# Patient Record
Sex: Male | Born: 1935
Health system: Southern US, Community
[De-identification: ages and names within clinical notes are randomized; demographics above are authoritative.]

## PROBLEM LIST (undated history)

## (undated) DIAGNOSIS — I714 Abdominal aortic aneurysm, without rupture, unspecified: Secondary | ICD-10-CM

## (undated) DIAGNOSIS — I2510 Atherosclerotic heart disease of native coronary artery without angina pectoris: Secondary | ICD-10-CM

## (undated) DIAGNOSIS — J449 Chronic obstructive pulmonary disease, unspecified: Secondary | ICD-10-CM

## (undated) DIAGNOSIS — I50810 Right heart failure, unspecified: Secondary | ICD-10-CM

## (undated) DIAGNOSIS — E785 Hyperlipidemia, unspecified: Secondary | ICD-10-CM

## (undated) DIAGNOSIS — I272 Pulmonary hypertension, unspecified: Secondary | ICD-10-CM

## (undated) DIAGNOSIS — J45909 Unspecified asthma, uncomplicated: Secondary | ICD-10-CM

## (undated) DIAGNOSIS — G4730 Sleep apnea, unspecified: Secondary | ICD-10-CM

## (undated) DIAGNOSIS — C4491 Basal cell carcinoma of skin, unspecified: Secondary | ICD-10-CM

## (undated) DIAGNOSIS — J9610 Chronic respiratory failure, unspecified whether with hypoxia or hypercapnia: Secondary | ICD-10-CM

## (undated) DIAGNOSIS — K5792 Diverticulitis of intestine, part unspecified, without perforation or abscess without bleeding: Secondary | ICD-10-CM

## (undated) DIAGNOSIS — C4490 Unspecified malignant neoplasm of skin, unspecified: Secondary | ICD-10-CM

## (undated) DIAGNOSIS — J189 Pneumonia, unspecified organism: Secondary | ICD-10-CM

## (undated) DIAGNOSIS — Z8709 Personal history of other diseases of the respiratory system: Secondary | ICD-10-CM

## (undated) DIAGNOSIS — I499 Cardiac arrhythmia, unspecified: Secondary | ICD-10-CM

## (undated) DIAGNOSIS — I219 Acute myocardial infarction, unspecified: Secondary | ICD-10-CM

## (undated) DIAGNOSIS — E039 Hypothyroidism, unspecified: Secondary | ICD-10-CM

## (undated) DIAGNOSIS — I350 Nonrheumatic aortic (valve) stenosis: Secondary | ICD-10-CM

## (undated) DIAGNOSIS — I509 Heart failure, unspecified: Secondary | ICD-10-CM

## (undated) HISTORY — DX: Sleep apnea, unspecified: G47.30

## (undated) HISTORY — PX: CORONARY ARTERY BYPASS GRAFT: SHX141

## (undated) HISTORY — DX: Chronic obstructive pulmonary disease, unspecified: J44.9

## (undated) HISTORY — PX: EYE SURGERY: SHX253

## (undated) HISTORY — DX: Atherosclerotic heart disease of native coronary artery without angina pectoris: I25.10

## (undated) HISTORY — DX: Abdominal aortic aneurysm, without rupture, unspecified: I71.40

## (undated) HISTORY — DX: Basal cell carcinoma of skin, unspecified: C44.91

## (undated) HISTORY — DX: Unspecified asthma, uncomplicated: J45.909

## (undated) HISTORY — DX: Abdominal aortic aneurysm, without rupture: I71.4

## (undated) HISTORY — DX: Unspecified malignant neoplasm of skin, unspecified: C44.90

## (undated) NOTE — *Deleted (*Deleted)
NAME:  QUILL GRINDER, MRN:  [id number], DOB:  08/20/1923, LOS: 0 ADMISSION DATE:  09/07/2020, CONSULTATION DATE:  09/08/20 REFERRING MD:  Dr April P, CHIEF COMPLAINT:  Septic shock from cholecystitis  Brief History   30 yo M presenting with acute cholecystitis with septioc shock, resp failure, new discovery urothelial cancer.  History of present illness    89 year full code and cognitively intact male with medical problems listed below. He is on home palliative care (not hospice). Doing ADLs as of Spring/summer 2021. Originally from [place]. [facility] vet. Relocated in 2021 to [place] from [place]. S/p CABG 1998. S/p infrarenal endovascular repair of AAA 2009. 2018 s/p diverticular bleed. 2018 - spont ptx hx.  Echo 2019 with RVSP . RHC Feb 2019 with PCWP 10, PVR 8 and mPAP 44. Follows Dr Isaiah Serge in [place] for mixed ILD (calcified RUL scarring and mild ILD. The repeat high res resolution CT shows stability of findings from 2016 - 2019.) and associated emphysema . Last seen Jan 2021. Also OSA on cPAP   Presented late hours of 09/07/20 with SIRS (temp 102F) and sepsis syndrome. Had worsening hypoxemia (baseline 2l Zinc) and needing NRB but later [facility] after failing to tolerate bipap. CTA ruled oput PE but shouwed LUL infiltrate. CTAb showed evidence of acute cholecystitis. He was in septic shock and needing levophed via PIV. CCM asked to admit. PEr EDP - Dr Carolynne Edouard of [facility] aware of patient and will be seen > 7am on 09/08/20 day shift for consideration of perc drain. Patient is full code  Notable other admit finding  - urothelial cancer on bladder wall seen posteriorly without perforation (not mentioned in past hx) - no hx of covid vaccination on chart  Past Medical History     has a past medical history of AAA (abdominal aortic aneurysm) (HCC), Acute respiratory failure (HCC) (11/23/2019), Asthma, Basal cell carcinoma, Chronic respiratory failure (HCC), COPD (chronic obstructive pulmonary disease) (HCC),  Coronary artery disease, History of chronic respiratory failure, Hyperlipidemia, Hypothyroidism, Right heart failure (HCC), Skin cancer (2017), and Sleep apnea.   reports that he quit smoking about 59 years ago. He smoked 0.50 packs per day. He has never used smokeless tobacco.  Past Surgical History:  Procedure Laterality Date  . bleeding ulcer  2016  . BYPASS AXILLA/BRACHIAL ARTERY  1998  . colon bleed  2012  . COLONOSCOPY N/A 06/07/2017   Procedure: COLONOSCOPY;  Surgeon: Jeani Hawking, MD;  Location: [facility] ENDOSCOPY;  Service: Endoscopy;  Laterality: N/A;  . DEBRIDMENT OF DECUBITUS ULCER  2002  . IR ANGIOGRAM SELECTIVE EACH ADDITIONAL VESSEL  03/22/2020  . IR ANGIOGRAM VISCERAL SELECTIVE  03/22/2020  . IR US GUIDE VASC ACCESS RIGHT  03/22/2020  . RIGHT HEART CATH N/A 12/11/2017   Procedure: RIGHT HEART CATH;  Surgeon: Dolores Patty, MD;  Location: [facility] INVASIVE CV LAB;  Service: Cardiovascular;  Laterality: N/A;  . THORACIC AORTA STENT  2009    No Known Allergies  Immunization History  Administered Date(s) Administered  . Influenza, High Dose Seasonal PF 07/27/2016, 08/05/2017, 08/10/2019  . Pneumococcal Conjugate-13 08/05/2017  . Pneumococcal Polysaccharide-23 10/22/2002, 08/08/2018  . Pneumococcal-Unspecified 10/22/2002    Family History  Problem Relation Age of Onset  . Emphysema Mother   . Stroke Father   . Movement disorder Sister   . Aneurysm Brother        Thoracic aorta  . Macular degeneration Brother      Current Facility-Administered Medications:  .  0.9 %  sodium chloride infusion, 250 mL, Intravenous, Continuous, McDonald, Mia A, PA-C, Last Rate: 20 mL/hr at 09/08/20 0651, 250 mL at 09/08/20 0651 .  0.9 %  sodium chloride infusion, 250 mL, Intravenous, Continuous, Ramaswamy, Murali, MD .  albuterol (PROVENTIL) (2.5 MG/3ML) 0.083% nebulizer solution 2.5 mg, 2.5 mg, Nebulization, Q4H, Ramaswamy, Murali, MD, 2.5 mg at 09/08/20 0810 .  ceFEPIme (MAXIPIME) 2 g in  sodium chloride 0.9 % 100 mL IVPB, 2 g, Intravenous, Q12H, Pham, Anh P, RPH .  dextrose 5 % in lactated ringers infusion, , Intravenous, Continuous, Ramaswamy, Murali, MD, Last Rate: 50 mL/hr at 09/08/20 0803, New Bag at 09/08/20 0803 .  docusate sodium (COLACE) capsule 100 mg, 100 mg, Oral, BID PRN, Kalman Shan, MD .  enoxaparin (LOVENOX) injection 30 mg, 30 mg, Subcutaneous, Daily, Ramaswamy, Murali, MD .  insulin aspart (novoLOG) injection 0-15 Units, 0-15 Units, Subcutaneous, Q4H, Ramaswamy, Murali, MD .  ipratropium (ATROVENT) nebulizer solution 0.5 mg, 0.5 mg, Nebulization, Q4H, Ramaswamy, Murali, MD, 0.5 mg at 09/08/20 0810 .  levothyroxine (SYNTHROID) tablet 100 mcg, 100 mcg, Oral, Q0600, Kalman Shan, MD, 100 mcg at 09/08/20 0801 .  norepinephrine (LEVOPHED) 4mg  in premix infusion, 2-10 mcg/min, Intravenous, Titrated, Ramaswamy, Murali, MD .  pantoprazole (PROTONIX) injection 40 mg, 40 mg, Intravenous, QHS, Ramaswamy, Murali, MD .  polyethylene glycol (MIRALAX / GLYCOLAX) packet 17 g, 17 g, Oral, Daily PRN, Kalman Shan, MD    Significant Hospital Events   09/07/2020 - ER 11/18- admit by CCM  Consults:  Surgery  Procedures:    Significant Diagnostic Tests:  11/18 CTA Chest/Abdomen >> Acute cholecystitis; ***  Micro Data:   11/18 Covid >> negative 11/18 Flu >> negative 11/18 Urine Culture >> 11/18 BC X2 >>   Antimicrobials:   11/18 Zosyn >> 11/18 11/18 Vanc >> 11/18 11/18 Cefepime >>   Interim history/subjective:  Afebrile; appears comfortable; on HFNC 15 l/m BP 80/52 on 10 of Levo; HR 51 Tmax 101.7; WBC 12.6 Glucose range 90-119 I/O total +277.6  Objective   Blood pressure (!) 95/52, pulse (!) 52, temperature 97.6 F (36.4 C), temperature source Oral, resp. rate 15, height 5\' 7"  (1.702 m), weight 70.3 kg, SpO2 93 %.       No intake or output data in the 24 hours ending 09/08/20 1007 Filed Weights   09/08/20 0731  Weight: 70.3 kg     Examination: General: edlerly male in bed in ER. No distrress HENT: HHFNC + Lungs: CTA bilaterally. Overall dminished. No wheeze. No crack;es Cardiovascular: Tachy Abdomen: soft. Non tender Extremities: intact. No cyanosis. No clubbing. No eema Neuro: alert and oritened x 3 GU: not examined  Resolved Hospital Problem list   Hypokalemia  Assessment & Plan:  ASSESSMENT / PLAN:  Septic Shock Hypotension Leukocytosis Fever -Levophed MAP > 65 -IV fluids -Continue Cefepime broad spectrum coverage -Follow Blood cultures and urine culture -Follow intermittent CXR  Acute hypoxic resp failure Emphysema and post inflammatory ILD Chronic oxygen at home OSA on CPAP at home Pulmonary Hypertension -Continue HFNC to maintain sats > 88% -Albuterol and Atrovent  -Will order Home med Symbicort tomorrow -CPAP at bedtime and day prn -Will hold home meds sildenafil, macitentan, torsemide and spironolactone   Acute Cholecystitis -Defer to surgery -Plan for HIDA scan 11/18; Perc chole drain placement following HIDA scan -Continue Cefepime and IV fluids  D-CHF and cor pulmonale (sees Dr Teressa Lower) Bradycardia -Consider Echo  Urothelial cancer -Defer to urollogy  Hypokalemia -Resolved -Recheck CMP in morning -Replete K  prn   Hypothyroidsism -Continue home med Synthroid   Best practice:  Diet: NPO but meds Pain/Anxiety/Delirium protocol (if indicated): NA VAP protocol (if indicated): NA DVT prophylaxis: Lovenox GI prophylaxis: ppi Glucose control: ssi Mobility: bed rest Code Status: full code Family Communication: ***Called daughter Festus Aloe [phone] at 6.56am 09/08/20 Disposition: ICU     ATTESTATION & SIGNATURE      09/08/2020 10:07 AM    LABS    PULMONARY No results for input(s): PHART, PCO2ART, PO2ART, HCO3, TCO2, O2SAT in the last 168 hours.  Invalid input(s): PCO2, PO2  CBC Recent Labs  Lab 09/07/20 2320 09/08/20 0740  HGB 13.4  12.1*  HCT 40.1 37.3*  WBC 11.1* 12.6*  PLT 245 232    COAGULATION Recent Labs  Lab 09/07/20 2355 09/08/20 0740  INR 1.1 1.3*    CARDIAC  No results for input(s): TROPONINI in the last 168 hours. No results for input(s): PROBNP in the last 168 hours.   CHEMISTRY Recent Labs  Lab 09/07/20 2320 09/08/20 0011 09/08/20 0740  NA 137  --  136  K 3.3*  --  3.6  CL 102  --  102  CO2 24  --  24  GLUCOSE 113*  --  129*  BUN 26*  --  25*  CREATININE 1.14 1.20 1.19  CALCIUM 8.4*  --  7.9*  MG  --   --  2.0  PHOS  --   --  4.2   Estimated Creatinine Clearance: 34.7 mL/min (by C-G formula based on SCr of 1.19 mg/dL).   LIVER Recent Labs  Lab 09/07/20 2320 09/07/20 2355 09/08/20 0740  AST 608*  --  439*  ALT 297*  --  278*  ALKPHOS 227*  --  184*  BILITOT 2.7*  --  3.7*  PROT 7.8  --  6.9  ALBUMIN 3.8  --  3.2*  INR  --  1.1 1.3*     INFECTIOUS Recent Labs  Lab 09/07/20 2355 09/08/20 0740  LATICACIDVEN 1.5 1.3  PROCALCITON  --  6.79     ENDOCRINE CBG (last 3)  Recent Labs    09/08/20 0754  GLUCAP 119*         IMAGING x48h  - image(s) personally visualized  -   highlighted in bold CT Angio Chest PE W and/or Wo Contrast  Result Date: 09/08/2020 CLINICAL DATA:  Fever, chills, leukocytosis, thoracoabdominal aortic aneurysm, respiratory failure EXAM: CT ANGIOGRAPHY CHEST CT ABDOMEN AND PELVIS WITH CONTRAST TECHNIQUE: Multidetector CT imaging of the chest was performed using the standard protocol during bolus administration of intravenous contrast. Multiplanar CT image reconstructions and MIPs were obtained to evaluate the vascular anatomy. Multidetector CT imaging of the abdomen and pelvis was performed using the standard protocol during bolus administration of intravenous contrast. CONTRAST:  OMNIPAQUE IOHEXOL 350 MG/ML SOLN COMPARISON:  CTA chest 11/22/2019, CT abdomen pelvis 07/14/2020 the TI is FINDINGS: CTA CHEST FINDINGS Cardiovascular: There  is excellent opacification of the pulmonary arterial tree. No intraluminal filling defect identified to suggest acute pulmonary embolism. Central pulmonary arteries are enlarged in keeping with changes of pulmonary arterial hypertension, unchanged from prior examination. Coronary artery bypass grafting has been performed. There is mild global cardiomegaly with particular enlargement of the right atrium and right ventricle. There is reflux of contrast into the hepatic caval junction in keeping with at least some degree of right heart failure. Extensive calcification of the a aortic valve leaflets. No pericardial effusion. The ascending aorta is  dilated measuring 4.5 cm in greatest dimension, stable since prior examination. The aortic arch measures 3.1 cm in greatest dimension. The descending thoracic aorta at the level of the left atrium measures 2.8 cm in greatest dimension. Mediastinum/Nodes: No pathologic thoracic adenopathy. Esophagus unremarkable. Lungs/Pleura: Imaging is slightly limited by motion artifact. There has developed progressive subpleural reticulation throughout the lungs likely representing mild fibrotic change. There is, however, more focal superimposed ground-glass pulmonary infiltrate within the left upper lobe, likely infectious or inflammatory in the acute setting. No pneumothorax or pleural effusion. Central airways are widely patent. Musculoskeletal: The osseous structures are age-appropriate. No acute bone abnormality peer Review of the MIP images confirms the above findings. CT ABDOMEN and PELVIS FINDINGS Hepatobiliary: The gallbladder is distended, there is marked gallbladder wall thickening, and extensive pericholecystic inflammatory stranding in keeping with changes of acute cholecystitis. Focal intrahepatic biliary ductal dilation within the inferior right hepatic lobe and lateral segment of the left hepatic lobe is unchanged from prior examination, possibly the sequela of remote trauma  or inflammation. The liver is otherwise unremarkable. No extrahepatic biliary ductal dilation peer Pancreas: Unremarkable Spleen: Unremarkable Adrenals/Urinary Tract: The adrenal glands are unremarkable. The kidneys are normal. A 2.3 cm enhancing mural mass is again identified within the right posterior basal wall of the bladder compatible with a a urothelial carcinoma this appears similar to prior examination. The mass does not clearly demonstrate transmural extension at this time. The mass is in close proximity to the right ureterovesicular junction. The bladder is otherwise unremarkable. Stomach/Bowel: Extensive descending and sigmoid colonic diverticulosis. The sigmoid colon is redundant. The stomach, small bowel, and large bowel are otherwise unremarkable. The appendix is not clearly identified, however, there are no secondary signs of appendicitis within the pericecal region. No free intraperitoneal gas or fluid. Vascular/Lymphatic: Infrarenal abdominal aortic aneurysm has undergone endovascular repair. The maximal diameter of the a aneurysm sac is stable at 4.5 cm. Moderate atherosclerotic calcifications seen within the lower extremity arterial outflow. The abdominal vasculature is otherwise unremarkable. No pathologic adenopathy within the abdomen and pelvis. Reproductive: Prostate is unremarkable. Other: Rectum unremarkable Musculoskeletal: Degenerative changes are seen within the a lumbar spine. Osseous structures are age-appropriate. No lytic or blastic bone lesions are seen. Review of the MIP images confirms the above findings. IMPRESSION: No pulmonary embolism. Morphologic changes of pulmonary arterial hypertension, elevated right heart pressures, and at least some degree of right heart failure. Stable 4.5 cm thoracic aortic aneurysm. Ascending thoracic aortic aneurysm. Recommend semi-annual imaging followup by CTA or MRA and referral to cardiothoracic surgery if not already obtained. This  recommendation follows 2010 ACCF/AHA/AATS/ACR/ASA/SCA/SCAI/SIR/STS/SVM Guidelines for the Diagnosis and Management of Patients With Thoracic Aortic Disease. Circulation. 2010; 121: Z610-R604. Aortic aneurysm NOS (ICD10-I71.9) Patchy ground-glass pulmonary infiltrate within the left upper lobe, likely infectious or inflammatory. Acute cholecystitis. Stable infrarenal abdominal aortic aneurysm, status post endovascular repair, at 4.5 cm. 2.3 cm enhancing urothelial carcinoma within the right posterior bladder lumen in the region of the right ureterovesicular junction. No definite evidence of transmural extension or regional lymphadenopathy. Aortic aneurysm NOS (ICD10-I71.9). Aortic Atherosclerosis (ICD10-I70.0). Electronically Signed   By: Helyn Numbers MD   On: 09/08/2020 03:09   CT ABDOMEN PELVIS W CONTRAST  Result Date: 09/08/2020 CLINICAL DATA:  Fever, chills, leukocytosis, thoracoabdominal aortic aneurysm, respiratory failure EXAM: CT ANGIOGRAPHY CHEST CT ABDOMEN AND PELVIS WITH CONTRAST TECHNIQUE: Multidetector CT imaging of the chest was performed using the standard protocol during bolus administration of intravenous contrast. Multiplanar CT image reconstructions and  MIPs were obtained to evaluate the vascular anatomy. Multidetector CT imaging of the abdomen and pelvis was performed using the standard protocol during bolus administration of intravenous contrast. CONTRAST:  OMNIPAQUE IOHEXOL 350 MG/ML SOLN COMPARISON:  CTA chest 11/22/2019, CT abdomen pelvis 07/14/2020 the TI is FINDINGS: CTA CHEST FINDINGS Cardiovascular: There is excellent opacification of the pulmonary arterial tree. No intraluminal filling defect identified to suggest acute pulmonary embolism. Central pulmonary arteries are enlarged in keeping with changes of pulmonary arterial hypertension, unchanged from prior examination. Coronary artery bypass grafting has been performed. There is mild global cardiomegaly with particular  enlargement of the right atrium and right ventricle. There is reflux of contrast into the hepatic caval junction in keeping with at least some degree of right heart failure. Extensive calcification of the a aortic valve leaflets. No pericardial effusion. The ascending aorta is dilated measuring 4.5 cm in greatest dimension, stable since prior examination. The aortic arch measures 3.1 cm in greatest dimension. The descending thoracic aorta at the level of the left atrium measures 2.8 cm in greatest dimension. Mediastinum/Nodes: No pathologic thoracic adenopathy. Esophagus unremarkable. Lungs/Pleura: Imaging is slightly limited by motion artifact. There has developed progressive subpleural reticulation throughout the lungs likely representing mild fibrotic change. There is, however, more focal superimposed ground-glass pulmonary infiltrate within the left upper lobe, likely infectious or inflammatory in the acute setting. No pneumothorax or pleural effusion. Central airways are widely patent. Musculoskeletal: The osseous structures are age-appropriate. No acute bone abnormality peer Review of the MIP images confirms the above findings. CT ABDOMEN and PELVIS FINDINGS Hepatobiliary: The gallbladder is distended, there is marked gallbladder wall thickening, and extensive pericholecystic inflammatory stranding in keeping with changes of acute cholecystitis. Focal intrahepatic biliary ductal dilation within the inferior right hepatic lobe and lateral segment of the left hepatic lobe is unchanged from prior examination, possibly the sequela of remote trauma or inflammation. The liver is otherwise unremarkable. No extrahepatic biliary ductal dilation peer Pancreas: Unremarkable Spleen: Unremarkable Adrenals/Urinary Tract: The adrenal glands are unremarkable. The kidneys are normal. A 2.3 cm enhancing mural mass is again identified within the right posterior basal wall of the bladder compatible with a a urothelial carcinoma  this appears similar to prior examination. The mass does not clearly demonstrate transmural extension at this time. The mass is in close proximity to the right ureterovesicular junction. The bladder is otherwise unremarkable. Stomach/Bowel: Extensive descending and sigmoid colonic diverticulosis. The sigmoid colon is redundant. The stomach, small bowel, and large bowel are otherwise unremarkable. The appendix is not clearly identified, however, there are no secondary signs of appendicitis within the pericecal region. No free intraperitoneal gas or fluid. Vascular/Lymphatic: Infrarenal abdominal aortic aneurysm has undergone endovascular repair. The maximal diameter of the a aneurysm sac is stable at 4.5 cm. Moderate atherosclerotic calcifications seen within the lower extremity arterial outflow. The abdominal vasculature is otherwise unremarkable. No pathologic adenopathy within the abdomen and pelvis. Reproductive: Prostate is unremarkable. Other: Rectum unremarkable Musculoskeletal: Degenerative changes are seen within the a lumbar spine. Osseous structures are age-appropriate. No lytic or blastic bone lesions are seen. Review of the MIP images confirms the above findings. IMPRESSION: No pulmonary embolism. Morphologic changes of pulmonary arterial hypertension, elevated right heart pressures, and at least some degree of right heart failure. Stable 4.5 cm thoracic aortic aneurysm. Ascending thoracic aortic aneurysm. Recommend semi-annual imaging followup by CTA or MRA and referral to cardiothoracic surgery if not already obtained. This recommendation follows 2010 ACCF/AHA/AATS/ACR/ASA/SCA/SCAI/SIR/STS/SVM Guidelines for the Diagnosis and  Management of Patients With Thoracic Aortic Disease. Circulation. 2010; 121: [id number]. Aortic aneurysm NOS (ICD10-I71.9) Patchy ground-glass pulmonary infiltrate within the left upper lobe, likely infectious or inflammatory. Acute cholecystitis. Stable infrarenal abdominal aortic  aneurysm, status post endovascular repair, at 4.5 cm. 2.3 cm enhancing urothelial carcinoma within the right posterior bladder lumen in the region of the right ureterovesicular junction. No definite evidence of transmural extension or regional lymphadenopathy. Aortic aneurysm NOS (ICD10-I71.9). Aortic Atherosclerosis (ICD10-I70.0). Electronically Signed   By: Helyn Numbers MD   On: 09/08/2020 03:09   DG Chest Portable 1 View  Result Date: 09/08/2020 CLINICAL DATA:  Initial evaluation for acute shortness of breath. EXAM: PORTABLE CHEST 1 VIEW COMPARISON:  Prior radiograph from 12/21/2019. FINDINGS: Median sternotomy wires underlying CABG markers noted. Mild cardiomegaly, stable. Mediastinal silhouette within normal limits. Lungs are hypoinflated with probable underlying interstitial lung disease and/or fibrotic changes. No focal infiltrates or consolidative opacity. Mild perihilar vascular congestion without overt pulmonary edema. No pleural effusion. No pneumothorax. No acute osseous finding. IMPRESSION: 1. Shallow lung inflation with probable underlying interstitial lung disease and/or fibrotic changes. 2. Mild perihilar vascular congestion without overt pulmonary edema. 3. No other active cardiopulmonary disease. Electronically Signed   By: Rise Mu M.D.   On: 09/08/2020 01:09

## (undated) NOTE — *Deleted (*Deleted)
Per pt request, this writer will return around 2330 to assist with nocturnal bipap.

## (undated) NOTE — *Deleted (*Deleted)
NAME:  Vincent Robinson, MRN:  [id number], DOB:  [birth year], LOS: 1 ADMISSION DATE:  09/07/2020, CONSULTATION DATE:  09/08/20 REFERRING MD:  Dr April Pulumbo, CHIEF COMPLAINT:  Septic shock from cholecystitis  Brief History   42 y/o M admitted 11/17 with abdominal pain.  Work up concerning for septic shock secondary to acute cholecystitis, acute hypoxic resp failure.   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, seen in 03/2020  Significant Hospital Events   11/18 Admit with abd pain, concern for acute cholecystitis   Consults:    Procedures:    Significant Diagnostic Tests:  11/18 CT ABD/Pelvis w Contrast >> acute cholecystitis, 2.3 CM enhancing urothelial carcinoma within the right posterior bladder lumen in the region of the right ureterovesicular junction  11/18 CTA Chest 11/8 >> negative for PE, morphologic changes of pulmonary arterial hypertension, elevated right heart pressures, some degree of right heart failure, stable 4.5 cm thoracic aortic aneurysm, patchy ground-glass infiltrate in LUL 11/18 Echo >> EF 55-60%; Grade 2 diastolic dysfunction, elevated left atrial pressure  Micro Data:  COVID 11/18 >> negative  Influenza 11/18 >> negative BCx2 11/17 >>  U. Strep Antigen 11/18 >> negative  Legionella 11/18 >>  UC 11/18 >>   Antimicrobials:  Vanco 11/17 >> 11/17 Cefepime 11/18 >> 11/18 Zosyn 11/18 >>   Interim history/subjective:  Pt denies acute pain.  Reports he feels a little better.   Objective   Blood pressure (!) 112/33, pulse (!) 50, temperature 98.4 F (36.9 C), temperature source Oral, resp. rate 17, height 5\' 7"  (1.702 m), weight 77 kg, SpO2 91 %.    FiO2 (%):  [95 %-100 %] 100 %   Intake/Output Summary  (Last 24 hours) at 09/09/2020 0849 Last data filed at 09/09/2020 0536 Gross per 24 hour  Intake 2560.24 ml  Output 650 ml  Net 1910.24 ml   Filed Weights   09/08/20 0731 09/09/20 0500  Weight: 70.3 kg 77 kg    Examination: General: pleasant elderly adult male lying in bed in NAD HEENT: MM pink/moist, anicteric Neuro: AAOx4, speech clear, MAE CV: s1s2, Irregular rhythm noted on monitor (appears to be AFIB), no m/r/g PULM: non-labored on 15 l/m [place] O2, lungs bilaterally clear  GI: soft, bsx4 active, perc chole drain in place with dark green drainage Extremities: warm/dry, no edema  Skin: no rashes or lesions   Resolved Hospital Problem list     Assessment & Plan:   Septic Shock in setting of suspected Acute Cholecystitis  On midodrine at home -Patient has Perc Chole Drain placed on 11/18 -Wean levophed today for MAP >65  -resume home midodrine TID  -Continue IV fluids  -monitor hemodynamics in ICU   -follow cultures  Severe Pulmonary HTN  Emphysema  Post Inflammatory ILD  Acute on Chronic Hypoxic Respiratory Failure  OSA  -hold home macitentan, sildenafil, torsemide, spironolactone  -O2 to support sats >88% -continue bronchodilators and Dulera -continue CPAP QHS   D-CHF and Cor Pulmonale (sees Dr Teressa Lower) -follow troponin  -await follow up ECHO  -hold home diuretic regimen   Hypokalemia  -monitor, replace as indicated    Suspected Urothelial Cancer (noted on CT in June 2021) -will need Urology consult prior to discharge  Hypothyroidism  -continue home synthroid  -follow up TSH    Best practice:  Diet: *** Pain/Anxiety/Delirium protocol (if indicated): Vicodin prn VAP protocol (if indicated): n/a DVT prophylaxis: Lovenox GI prophylaxis: PPI Glucose control: monitor glucose  Mobility: up as tolerated Code Status: full code   Family Communication: *** Daughter Festus Aloe [phone] updated per Dr. Marchelle Gearing. Disposition: ICU   CC Time: n/a     Canary Brim, MSN, NP-C, AGACNP-BC [facility] 09/09/2020, 8:49 AM   Please see [email or web address] for pager details.       LABS    PULMONARY No results for input(s): PHART, PCO2ART, PO2ART, HCO3, TCO2, O2SAT in the last 168 hours.  Invalid input(s): PCO2, PO2  CBC Recent Labs  Lab 09/07/20 2320 09/08/20 0740 09/09/20 0500  HGB 13.4 12.1* 12.1*  HCT 40.1 37.3* 37.9*  WBC 11.1* 12.6* 11.9*  PLT 245 232 204    COAGULATION Recent Labs  Lab 09/07/20 2355 09/08/20 0740  INR 1.1 1.3*    CARDIAC  No results for input(s): TROPONINI in the last 168 hours. No results for input(s): PROBNP in the last 168 hours.   CHEMISTRY Recent Labs  Lab 09/07/20 2320 09/07/20 2320 09/08/20 0011 09/08/20 0740 09/09/20 0500  NA 137  --   --  136 133*  K 3.3*   < >  --  3.6 3.8  CL 102  --   --  102 103  CO2 24  --   --  24 22  GLUCOSE 113*  --   --  129* 118*  BUN 26*  --   --  25* 23  CREATININE 1.14  --  1.20 1.19 0.97  CALCIUM 8.4*  --   --  7.9* 7.9*  MG  --   --   --  2.0 1.7  PHOS  --   --   --  4.2 3.8   < > = values in this interval not displayed.   Estimated Creatinine Clearance: 42.6 mL/min (by C-G formula based on SCr of 0.97 mg/dL).   LIVER Recent Labs  Lab 09/07/20 2320 09/07/20 2355 09/08/20 0740 09/09/20 0500  AST 608*  --  439* 147*  ALT 297*  --  278* 167*  ALKPHOS 227*  --  184* 113  BILITOT 2.7*  --  3.7* 4.2*  PROT 7.8  --  6.9 6.2*  ALBUMIN 3.8  --  3.2* 2.7*  INR  --  1.1 1.3*  --     INFECTIOUS Recent Labs  Lab 09/07/20 2355 09/08/20 0740 09/08/20 1043  LATICACIDVEN 1.5 1.3 2.1*  PROCALCITON  --  6.79  --     ENDOCRINE CBG (last 3)  Recent Labs    09/09/20 0008 09/09/20 0344 09/09/20 0757  GLUCAP 109* 122* 103*    IMAGING   CT Angio Chest PE W and/or Wo Contrast  Result Date: 09/08/2020 CLINICAL DATA:  Fever, chills, leukocytosis, thoracoabdominal aortic aneurysm, respiratory failure EXAM: CT  ANGIOGRAPHY CHEST CT ABDOMEN AND PELVIS WITH CONTRAST TECHNIQUE: Multidetector CT imaging of the chest was performed using the standard protocol during bolus administration of intravenous contrast. Multiplanar CT image reconstructions and MIPs were obtained to evaluate the vascular anatomy. Multidetector CT imaging of the abdomen and pelvis was performed using the standard protocol during bolus administration of intravenous contrast. CONTRAST:  OMNIPAQUE IOHEXOL 350 MG/ML SOLN COMPARISON:  CTA chest 11/22/2019, CT abdomen pelvis 07/14/2020 the TI is FINDINGS: CTA CHEST FINDINGS Cardiovascular: There is excellent opacification of  the pulmonary arterial tree. No intraluminal filling defect identified to suggest acute pulmonary embolism. Central pulmonary arteries are enlarged in keeping with changes of pulmonary arterial hypertension, unchanged from prior examination. Coronary artery bypass grafting has been performed. There is mild global cardiomegaly with particular enlargement of the right atrium and right ventricle. There is reflux of contrast into the hepatic caval junction in keeping with at least some degree of right heart failure. Extensive calcification of the a aortic valve leaflets. No pericardial effusion. The ascending aorta is dilated measuring 4.5 cm in greatest dimension, stable since prior examination. The aortic arch measures 3.1 cm in greatest dimension. The descending thoracic aorta at the level of the left atrium measures 2.8 cm in greatest dimension. Mediastinum/Nodes: No pathologic thoracic adenopathy. Esophagus unremarkable. Lungs/Pleura: Imaging is slightly limited by motion artifact. There has developed progressive subpleural reticulation throughout the lungs likely representing mild fibrotic change. There is, however, more focal superimposed ground-glass pulmonary infiltrate within the left upper lobe, likely infectious or inflammatory in the acute setting. No pneumothorax or pleural  effusion. Central airways are widely patent. Musculoskeletal: The osseous structures are age-appropriate. No acute bone abnormality peer Review of the MIP images confirms the above findings. CT ABDOMEN and PELVIS FINDINGS Hepatobiliary: The gallbladder is distended, there is marked gallbladder wall thickening, and extensive pericholecystic inflammatory stranding in keeping with changes of acute cholecystitis. Focal intrahepatic biliary ductal dilation within the inferior right hepatic lobe and lateral segment of the left hepatic lobe is unchanged from prior examination, possibly the sequela of remote trauma or inflammation. The liver is otherwise unremarkable. No extrahepatic biliary ductal dilation peer Pancreas: Unremarkable Spleen: Unremarkable Adrenals/Urinary Tract: The adrenal glands are unremarkable. The kidneys are normal. A 2.3 cm enhancing mural mass is again identified within the right posterior basal wall of the bladder compatible with a a urothelial carcinoma this appears similar to prior examination. The mass does not clearly demonstrate transmural extension at this time. The mass is in close proximity to the right ureterovesicular junction. The bladder is otherwise unremarkable. Stomach/Bowel: Extensive descending and sigmoid colonic diverticulosis. The sigmoid colon is redundant. The stomach, small bowel, and large bowel are otherwise unremarkable. The appendix is not clearly identified, however, there are no secondary signs of appendicitis within the pericecal region. No free intraperitoneal gas or fluid. Vascular/Lymphatic: Infrarenal abdominal aortic aneurysm has undergone endovascular repair. The maximal diameter of the a aneurysm sac is stable at 4.5 cm. Moderate atherosclerotic calcifications seen within the lower extremity arterial outflow. The abdominal vasculature is otherwise unremarkable. No pathologic adenopathy within the abdomen and pelvis. Reproductive: Prostate is unremarkable. Other:  Rectum unremarkable Musculoskeletal: Degenerative changes are seen within the a lumbar spine. Osseous structures are age-appropriate. No lytic or blastic bone lesions are seen. Review of the MIP images confirms the above findings. IMPRESSION: No pulmonary embolism. Morphologic changes of pulmonary arterial hypertension, elevated right heart pressures, and at least some degree of right heart failure. Stable 4.5 cm thoracic aortic aneurysm. Ascending thoracic aortic aneurysm. Recommend semi-annual imaging followup by CTA or MRA and referral to cardiothoracic surgery if not already obtained. This recommendation follows 2010 ACCF/AHA/AATS/ACR/ASA/SCA/SCAI/SIR/STS/SVM Guidelines for the Diagnosis and Management of Patients With Thoracic Aortic Disease. Circulation. 2010; 121: U981-X914. Aortic aneurysm NOS (ICD10-I71.9) Patchy ground-glass pulmonary infiltrate within the left upper lobe, likely infectious or inflammatory. Acute cholecystitis. Stable infrarenal abdominal aortic aneurysm, status post endovascular repair, at 4.5 cm. 2.3 cm enhancing urothelial carcinoma within the right posterior bladder lumen in the region of the  right ureterovesicular junction. No definite evidence of transmural extension or regional lymphadenopathy. Aortic aneurysm NOS (ICD10-I71.9). Aortic Atherosclerosis (ICD10-I70.0). Electronically Signed   By: Helyn Numbers MD   On: 09/08/2020 03:09   CT ABDOMEN PELVIS W CONTRAST  Result Date: 09/08/2020 CLINICAL DATA:  Fever, chills, leukocytosis, thoracoabdominal aortic aneurysm, respiratory failure EXAM: CT ANGIOGRAPHY CHEST CT ABDOMEN AND PELVIS WITH CONTRAST TECHNIQUE: Multidetector CT imaging of the chest was performed using the standard protocol during bolus administration of intravenous contrast. Multiplanar CT image reconstructions and MIPs were obtained to evaluate the vascular anatomy. Multidetector CT imaging of the abdomen and pelvis was performed using the standard protocol  during bolus administration of intravenous contrast. CONTRAST:  OMNIPAQUE IOHEXOL 350 MG/ML SOLN COMPARISON:  CTA chest 11/22/2019, CT abdomen pelvis 07/14/2020 the TI is FINDINGS: CTA CHEST FINDINGS Cardiovascular: There is excellent opacification of the pulmonary arterial tree. No intraluminal filling defect identified to suggest acute pulmonary embolism. Central pulmonary arteries are enlarged in keeping with changes of pulmonary arterial hypertension, unchanged from prior examination. Coronary artery bypass grafting has been performed. There is mild global cardiomegaly with particular enlargement of the right atrium and right ventricle. There is reflux of contrast into the hepatic caval junction in keeping with at least some degree of right heart failure. Extensive calcification of the a aortic valve leaflets. No pericardial effusion. The ascending aorta is dilated measuring 4.5 cm in greatest dimension, stable since prior examination. The aortic arch measures 3.1 cm in greatest dimension. The descending thoracic aorta at the level of the left atrium measures 2.8 cm in greatest dimension. Mediastinum/Nodes: No pathologic thoracic adenopathy. Esophagus unremarkable. Lungs/Pleura: Imaging is slightly limited by motion artifact. There has developed progressive subpleural reticulation throughout the lungs likely representing mild fibrotic change. There is, however, more focal superimposed ground-glass pulmonary infiltrate within the left upper lobe, likely infectious or inflammatory in the acute setting. No pneumothorax or pleural effusion. Central airways are widely patent. Musculoskeletal: The osseous structures are age-appropriate. No acute bone abnormality peer Review of the MIP images confirms the above findings. CT ABDOMEN and PELVIS FINDINGS Hepatobiliary: The gallbladder is distended, there is marked gallbladder wall thickening, and extensive pericholecystic inflammatory stranding in keeping with  changes of acute cholecystitis. Focal intrahepatic biliary ductal dilation within the inferior right hepatic lobe and lateral segment of the left hepatic lobe is unchanged from prior examination, possibly the sequela of remote trauma or inflammation. The liver is otherwise unremarkable. No extrahepatic biliary ductal dilation peer Pancreas: Unremarkable Spleen: Unremarkable Adrenals/Urinary Tract: The adrenal glands are unremarkable. The kidneys are normal. A 2.3 cm enhancing mural mass is again identified within the right posterior basal wall of the bladder compatible with a a urothelial carcinoma this appears similar to prior examination. The mass does not clearly demonstrate transmural extension at this time. The mass is in close proximity to the right ureterovesicular junction. The bladder is otherwise unremarkable. Stomach/Bowel: Extensive descending and sigmoid colonic diverticulosis. The sigmoid colon is redundant. The stomach, small bowel, and large bowel are otherwise unremarkable. The appendix is not clearly identified, however, there are no secondary signs of appendicitis within the pericecal region. No free intraperitoneal gas or fluid. Vascular/Lymphatic: Infrarenal abdominal aortic aneurysm has undergone endovascular repair. The maximal diameter of the a aneurysm sac is stable at 4.5 cm. Moderate atherosclerotic calcifications seen within the lower extremity arterial outflow. The abdominal vasculature is otherwise unremarkable. No pathologic adenopathy within the abdomen and pelvis. Reproductive: Prostate is unremarkable. Other: Rectum unremarkable Musculoskeletal: Degenerative  changes are seen within the a lumbar spine. Osseous structures are age-appropriate. No lytic or blastic bone lesions are seen. Review of the MIP images confirms the above findings. IMPRESSION: No pulmonary embolism. Morphologic changes of pulmonary arterial hypertension, elevated right heart pressures, and at least some degree  of right heart failure. Stable 4.5 cm thoracic aortic aneurysm. Ascending thoracic aortic aneurysm. Recommend semi-annual imaging followup by CTA or MRA and referral to cardiothoracic surgery if not already obtained. This recommendation follows 2010 ACCF/AHA/AATS/ACR/ASA/SCA/SCAI/SIR/STS/SVM Guidelines for the Diagnosis and Management of Patients With Thoracic Aortic Disease. Circulation. 2010; 121: L244-W102. Aortic aneurysm NOS (ICD10-I71.9) Patchy ground-glass pulmonary infiltrate within the left upper lobe, likely infectious or inflammatory. Acute cholecystitis. Stable infrarenal abdominal aortic aneurysm, status post endovascular repair, at 4.5 cm. 2.3 cm enhancing urothelial carcinoma within the right posterior bladder lumen in the region of the right ureterovesicular junction. No definite evidence of transmural extension or regional lymphadenopathy. Aortic aneurysm NOS (ICD10-I71.9). Aortic Atherosclerosis (ICD10-I70.0). Electronically Signed   By: Helyn Numbers MD   On: 09/08/2020 03:09   IR Perc Cholecystostomy  Result Date: 09/08/2020 CLINICAL DATA:  Sepsis, hypotension, acute cholecystitis suspected on recent CT EXAM: PERCUTANEOUS CHOLECYSTOSTOMY TUBE PLACEMENT WITH ULTRASOUND AND FLUOROSCOPIC GUIDANCE FLUOROSCOPY TIME:  30 seconds; 6 mGy TECHNIQUE: The procedure, risks (including but not limited to bleeding, infection, organ damage ), benefits, and alternatives were explained to the patient. Questions regarding the procedure were encouraged and answered. The patient understands and consents to the procedure. Survey ultrasound of the abdomen was performed and an appropriate skin entry site was identified. Skin site was marked, prepped with chlorhexidine, and draped in usual sterile fashion, and infiltrated locally with 1% lidocaine. Intravenous versed 1mg  were administered as conscious sedation during continuous monitoring of the patient's level of consciousness and physiological / cardiorespiratory  status by the radiology RN, with a total moderate sedation time of 16 minutes. Under real-time ultrasound guidance, gallbladder was accessed using a lateral approach with a 21-gauge needle. Ultrasound image documentation was saved. Bile returned through the hub. Needle was exchanged over a 018 guidewire for transitional dilator which allowed placement of 035 J wire. Over this, a 10.2 French pigtail catheter was advanced and formed centrally in the gallbladder lumen. 20 mL dark green bile aspirated, sent for culture. Small contrast injection confirmed appropriate position. Catheter secured externally with 0 Prolene suture and placed external drain bag. Patient tolerated the procedure well. COMPLICATIONS: COMPLICATIONS none IMPRESSION: 1. Technically successful percutaneous cholecystostomy tube placement with ultrasound and fluoroscopic guidance. Electronically Signed   By: Corlis Leak M.D.   On: 09/08/2020 16:53   DG CHEST PORT 1 VIEW  Result Date: 09/08/2020 CLINICAL DATA:  Respiratory failure EXAM: PORTABLE CHEST 1 VIEW COMPARISON:  September 07, 2020 chest radiograph and chest CT September 08, 2020 FINDINGS: Central catheter tip is in the superior vena cava. No pneumothorax. Note that mandible obscures portions of each medial apex. There is interstitial pulmonary edema, most notably in the bases. There is no airspace consolidation. Heart is mildly enlarged with pulmonary venous hypertension. Status post median sternotomy. No adenopathy. No bone lesions. IMPRESSION: Central catheter tip in superior vena cava without pneumothorax. Mild cardiomegaly with a degree of pulmonary vascular congestion. There is a degree of interstitial edema. The overall appearance is indicative of a degree of congestive heart failure. No airspace consolidation. Electronically Signed   By: Bretta Bang III M.D.   On: 09/08/2020 19:19   DG Chest Portable 1 View  Result Date: 09/08/2020  CLINICAL DATA:  Initial evaluation for  acute shortness of breath. EXAM: PORTABLE CHEST 1 VIEW COMPARISON:  Prior radiograph from 12/21/2019. FINDINGS: Median sternotomy wires underlying CABG markers noted. Mild cardiomegaly, stable. Mediastinal silhouette within normal limits. Lungs are hypoinflated with probable underlying interstitial lung disease and/or fibrotic changes. No focal infiltrates or consolidative opacity. Mild perihilar vascular congestion without overt pulmonary edema. No pleural effusion. No pneumothorax. No acute osseous finding. IMPRESSION: 1. Shallow lung inflation with probable underlying interstitial lung disease and/or fibrotic changes. 2. Mild perihilar vascular congestion without overt pulmonary edema. 3. No other active cardiopulmonary disease. Electronically Signed   By: Rise Mu M.D.   On: 09/08/2020 01:09   ECHOCARDIOGRAM COMPLETE  Result Date: 09/08/2020    ECHOCARDIOGRAM REPORT   Patient Name:   AUTHER LYERLY Date of Exam: 09/08/2020 Medical Rec #:  [id number]      Height:       67.0 in Accession #:    [id number]     Weight:       155.0 lb Date of Birth:  [birth year]       BSA:          1.815 m Patient Age:    89 years       BP:           96/54 mmHg Patient Gender: M              HR:           59 bpm. Exam Location:  Inpatient Procedure: 2D Echo and Intracardiac Opacification Agent Indications:    Acute Respiratory Insufficiency 518.82 / R06.89  History:        Patient has prior history of Echocardiogram examinations, most                 recent 11/23/2019. CHF, Pulmonary HTN and COPD, Aortic Valve                 Disease; Risk Factors:Sleep Apnea, Former Smoker and                 Dyslipidemia. AAA s/p Thoracic Stent. Asthma. Hypothyroid.                 Chronic Hypoxic Respiratory Failure.  Sonographer:    Leta Jungling RDCS Referring Phys: [id number] MURALI RAMASWAMY IMPRESSIONS  1. Left ventricular ejection fraction, by estimation, is 55 to 60%. The left ventricle has normal function. There is moderate  asymmetric left ventricular hypertrophy of the basal-septal segment. Left ventricular diastolic parameters are consistent with Grade II diastolic dysfunction (pseudonormalization). Elevated left atrial pressure. There is the interventricular septum is flattened in systole and diastole, consistent with right ventricular pressure and volume overload.  2. Right ventricular systolic function is moderately reduced. The right ventricular size is severely enlarged. There is severely elevated pulmonary artery systolic pressure. The estimated right ventricular systolic pressure is 81.6 mmHg.  3. Left atrial size was mildly dilated.  4. The mitral valve is normal in structure. Mild mitral valve regurgitation. No evidence of mitral stenosis.  5. Tricuspid valve regurgitation is moderate to severe.  6. The aortic valve is calcified. There is moderate calcification of the aortic valve. Aortic valve regurgitation is mild. Moderate aortic valve stenosis. Vmax 3.0 m/s, MG 20 mmHg, AVA 1.45 cm^2, DI 0.29  7. Aortic dilatation noted. There is mild dilatation of the ascending aorta, measuring 38 mm.  8. The inferior vena cava is dilated in size with <50% respiratory variability, suggesting  right atrial pressure of 15 mmHg. FINDINGS  Left Ventricle: Left ventricular ejection fraction, by estimation, is 55 to 60%. The left ventricle has normal function. The left ventricle has no regional wall motion abnormalities. Definity contrast agent was given IV to delineate the left ventricular  endocardial borders. The left ventricular internal cavity size was normal in size. There is moderate asymmetric left ventricular hypertrophy of the basal-septal segment. The interventricular septum is flattened in systole and diastole, consistent with right ventricular pressure and volume overload. Left ventricular diastolic parameters are consistent with Grade II diastolic dysfunction (pseudonormalization). Elevated left atrial pressure. Right Ventricle:  The right ventricular size is severely enlarged. Right vetricular wall thickness was not assessed. Right ventricular systolic function is moderately reduced. There is severely elevated pulmonary artery systolic pressure. The tricuspid regurgitant velocity is 4.08 m/s, and with an assumed right atrial pressure of 15 mmHg, the estimated right ventricular systolic pressure is 81.6 mmHg. Left Atrium: Left atrial size was mildly dilated. Right Atrium: Right atrial size was not well visualized. Pericardium: There is no evidence of pericardial effusion. Mitral Valve: The mitral valve is normal in structure. Mild mitral valve regurgitation. No evidence of mitral valve stenosis. Tricuspid Valve: The tricuspid valve is normal in structure. Tricuspid valve regurgitation is moderate to severe. Aortic Valve: The aortic valve is calcified. There is moderate calcification of the aortic valve. Aortic valve regurgitation is mild. Moderate aortic stenosis is present. Aortic valve mean gradient measures 16.2 mmHg. Aortic valve peak gradient measures 28.5 mmHg. Aortic valve area, by VTI measures 1.60 cm. Pulmonic Valve: The pulmonic valve was not well visualized. Pulmonic valve regurgitation is not visualized. Aorta: The aortic root is normal in size and structure and aortic dilatation noted. There is mild dilatation of the ascending aorta, measuring 38 mm. Venous: The inferior vena cava is dilated in size with less than 50% respiratory variability, suggesting right atrial pressure of 15 mmHg. IAS/Shunts: The interatrial septum was not well visualized.  LEFT VENTRICLE PLAX 2D LVIDd:         4.10 cm      Diastology LVIDs:         2.90 cm      LV e' medial:    7.27 cm/s LV PW:         1.30 cm      LV E/e' medial:  12.3 LV IVS:        1.50 cm      LV e' lateral:   9.46 cm/s LVOT diam:     2.45 cm      LV E/e' lateral: 9.4 LV SV:         94 LV SV Index:   52 LVOT Area:     4.71 cm  LV Volumes (MOD) LV vol d, MOD A2C: 98.7 ml LV vol d,  MOD A4C: 103.0 ml LV vol s, MOD A2C: 42.0 ml LV vol s, MOD A4C: 64.6 ml LV SV MOD A2C:     56.7 ml LV SV MOD A4C:     103.0 ml LV SV MOD BP:      49.1 ml RIGHT VENTRICLE RV S prime:     7.45 cm/s TAPSE (M-mode): 1.6 cm LEFT ATRIUM             Index LA diam:        4.20 cm 2.31 cm/m LA Vol (A2C):   74.8 ml 41.22 ml/m LA Vol (A4C):   59.3 ml 32.68 ml/m LA Biplane Vol: 68.3  ml 37.64 ml/m  AORTIC VALVE AV Area (Vmax):    1.64 cm AV Area (Vmean):   1.70 cm AV Area (VTI):     1.60 cm AV Vmax:           267.00 cm/s AV Vmean:          186.500 cm/s AV VTI:            0.590 m AV Peak Grad:      28.5 mmHg AV Mean Grad:      16.2 mmHg LVOT Vmax:         92.70 cm/s LVOT Vmean:        67.200 cm/s LVOT VTI:          0.200 m LVOT/AV VTI ratio: 0.34  AORTA Ao Root diam: 3.70 cm Ao Asc diam:  3.80 cm MITRAL VALVE               TRICUSPID VALVE MV Area (PHT): 5.84 cm    TR Peak grad:   66.6 mmHg MV Decel Time: 130 msec    TR Vmax:        408.00 cm/s MV E velocity: 89.10 cm/s MV A velocity: 61.70 cm/s  SHUNTS MV E/A ratio:  1.44        Systemic VTI:  0.20 m                            Systemic Diam: 2.45 cm Epifanio Lesches MD Electronically signed by Epifanio Lesches MD Signature Date/Time: 09/08/2020/7:27:55 PM    Final    [place] EKG SITE RITE  Result Date: 09/08/2020 If Site Rite image not attached, placement could not be confirmed due to current cardiac rhythm.

---

## 1996-10-22 HISTORY — PX: BYPASS AXILLA/BRACHIAL ARTERY: SHX6426

## 2000-10-22 HISTORY — PX: DEBRIDMENT OF DECUBITUS ULCER: SHX6276

## 2007-10-23 HISTORY — PX: THORACIC AORTA STENT: SHX2498

## 2010-10-22 HISTORY — PX: OTHER SURGICAL HISTORY: SHX169

## 2014-10-22 HISTORY — PX: OTHER SURGICAL HISTORY: SHX169

## 2015-10-23 DIAGNOSIS — C4490 Unspecified malignant neoplasm of skin, unspecified: Secondary | ICD-10-CM

## 2015-10-23 HISTORY — DX: Unspecified malignant neoplasm of skin, unspecified: C44.90

## 2016-08-06 ENCOUNTER — Encounter: Payer: Self-pay | Admitting: Pulmonary Disease

## 2016-08-06 ENCOUNTER — Ambulatory Visit (INDEPENDENT_AMBULATORY_CARE_PROVIDER_SITE_OTHER): Payer: Medicare Other | Admitting: Pulmonary Disease

## 2016-08-06 DIAGNOSIS — C4490 Unspecified malignant neoplasm of skin, unspecified: Secondary | ICD-10-CM

## 2016-08-06 NOTE — Patient Instructions (Signed)
Continue using the Symbicort and the BiPAP. We will follow the results of the CT of the chest as well as it is completed. Return to clinic in 3 months.

## 2016-08-06 NOTE — Progress Notes (Signed)
Vincent Robinson    [id number]    04/22/1923  Primary Care Physician:No primary care provider on file.  Referring Physician: Katherina Mires, MD [address], [place] [address]  Chief complaint:   Consult for COPD  HPI: Vincent Robinson is a delightful 82 year old with past history of COPD, hypersomnia with sleep apnea, allergic rhinitis, cough variant asthma. He recently moved from [place] to be close to his family and wants to establish care here. He is chief complaints of dyspnea on exertion. He has noticed increasing cough over the past month with white sputum production. He denies any fevers, chills. He saw his primary care doctor Dr. Doreene Nest who started him on Symbicort. He is also due to get a high-resolution CT of the chest to evaluate interstitial abnormalities.  He has history of sleep apnea on BiPAP at settings of 20/6. This is report of CT of the chest from [place] which shows calcified nodule, chronic scarring. 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 He moved to [place] in 1956. Apparently his immigration was delayed due to a lung abnormality on imaging. He was eventually cleared. He does not have any history of TB, exposure to TB. He's had an episode of influenza while in [place].   Outpatient Encounter Prescriptions as of 08/06/2016  Medication Sig  . albuterol (PROVENTIL HFA;VENTOLIN HFA) 108 (90 Base) MCG/ACT inhaler Inhale 1 puff into the lungs every 6 (six) hours as needed for wheezing or shortness of breath.  Marland Kitchen aspirin (ASPIRIN 81) 81 MG chewable tablet Chew 81 mg by mouth daily.  . Fluorouracil 4 % CREA Apply 5 L topically.  . fluticasone (VERAMYST) 27.5 MCG/SPRAY nasal spray Place 1 spray into the nose daily.  Marland Kitchen ketoconazole (NIZORAL) 2 % cream Apply 1 application topically daily.  Marland Kitchen levofloxacin (LEVAQUIN) 500  MG tablet Take 500 mg by mouth daily.  Marland Kitchen levothyroxine (SYNTHROID, LEVOTHROID) 112 MCG tablet Take 112 mcg by mouth daily before breakfast.  . montelukast (SINGULAIR) 10 MG tablet Take 10 mg by mouth at bedtime.  Marland Kitchen oxybutynin (DITROPAN) 5 MG tablet Take 5 mg by mouth every 8 (eight) hours as needed for bladder spasms.  . simvastatin (ZOCOR) 20 MG tablet Take 10 mg by mouth daily.   No facility-administered encounter medications on file as of 08/06/2016.     Allergies as of 08/06/2016  . (Not on File)    Past Medical History:  Diagnosis Date  . Asthma   . COPD (chronic obstructive pulmonary disease) ([place])   . Skin cancer 2017  . Skin cancer 2017  . Sleep apnea     History reviewed. No pertinent surgical history.  History reviewed. No pertinent family history.  Social History   Social History  . Marital status: Married    Spouse name: N/A  . Number of children: N/A  . Years of education: N/A   Occupational History  . Not on file.   Social History Main Topics  . Smoking status: Former Smoker    Packs/day: 0.50    Quit date: 08/06/1961  . Smokeless tobacco: Never Used  . Alcohol use Not on file  . Drug use: Unknown  . Sexual activity: Not on file   Other Topics Concern  . Not on file   Social History Narrative  .  No narrative on file     Review of systems: Review of Systems  Constitutional: Negative for fever and chills.  HENT: Negative.   Eyes: Negative for blurred vision.  Respiratory: as per HPI  Cardiovascular: Negative for chest pain and palpitations.  Gastrointestinal: Negative for vomiting, diarrhea, blood per rectum. Genitourinary: Negative for dysuria, urgency, frequency and hematuria.  Musculoskeletal: Negative for myalgias, back pain and joint pain.  Skin: Negative for itching and rash.  Neurological: Negative for dizziness, tremors, focal weakness, seizures and loss of consciousness.  Endo/Heme/Allergies: Negative for environmental allergies.    Psychiatric/Behavioral: Negative for depression, suicidal ideas and hallucinations.  All other systems reviewed and are negative.   Physical Exam: Blood pressure 122/82, pulse 71, height 5\' 8"  (1.727 m), weight 195 lb (88.5 kg), SpO2 90 %. Gen:      No acute distress HEENT:  EOMI, sclera anicteric Neck:     No masses; no thyromegaly Lungs:    Basal crackles; normal respiratory effort CV:         Regular rate and rhythm; no murmurs Abd:      + bowel sounds; soft, non-tender; no palpable masses, no distension Ext:    No edema; adequate peripheral perfusion Skin:      Warm and dry; no rash Neuro: alert and oriented x 3 Psych: normal mood and affect  Data Reviewed: PFTs 02/28/16 FVC 3.37 (116%) FEV1 2.08 (808%) F/F 62 Mild obstructive airway disease, no broncho-dilator response  Assessment:  #1 COPD, asthmatic bronchitis His PFT shows only mild obstruction. He has started Symbicort 2 days ago but has not noticed any change in symptoms yet. He'll continue this.  #2 Abnormal CT scan. Previous CTs have apparently shown a calcified granuloma and scarring. He is due to get a high-resolution CT of the chest. We'll review these images when available.  #3 OSA He is on BiPAP. We get in touch with the DME company to obtain the settings and download.  Plan/Recommendations: - Continue symbicort - Follow high res CT oof chest - Continue Bipap  Marshell Garfinkel MD [facility] Pager [phone] 08/06/2016, 4:28 PM  CC: Katherina Mires, MD

## 2016-08-14 ENCOUNTER — Encounter: Payer: Self-pay | Admitting: Pulmonary Disease

## 2016-10-05 ENCOUNTER — Encounter: Payer: Self-pay | Admitting: Pulmonary Disease

## 2016-11-06 ENCOUNTER — Ambulatory Visit (INDEPENDENT_AMBULATORY_CARE_PROVIDER_SITE_OTHER): Payer: Medicare Other | Admitting: Pulmonary Disease

## 2016-11-06 ENCOUNTER — Encounter: Payer: Self-pay | Admitting: Pulmonary Disease

## 2016-11-06 ENCOUNTER — Ambulatory Visit (INDEPENDENT_AMBULATORY_CARE_PROVIDER_SITE_OTHER)
Admission: RE | Admit: 2016-11-06 | Discharge: 2016-11-06 | Disposition: A | Discharge status: Home / Self Care | Payer: Medicare Other | Source: Ambulatory Visit | Attending: Pulmonary Disease | Admitting: Pulmonary Disease

## 2016-11-06 VITALS — BP 130/62 | HR 101 | Ht 67.0 in | Wt 194.8 lb

## 2016-11-06 DIAGNOSIS — R05 Cough: Secondary | ICD-10-CM | POA: Diagnosis not present

## 2016-11-06 DIAGNOSIS — R053 Chronic cough: Secondary | ICD-10-CM

## 2016-11-06 MED ORDER — MONTELUKAST SODIUM 10 MG PO TABS: 10.0000 mg | ORAL_TABLET | Freq: Every day | ORAL | 5 refills | Status: DC

## 2016-11-06 MED ORDER — BUDESONIDE-FORMOTEROL FUMARATE 160-4.5 MCG/ACT IN AERO: 2.0000 | INHALATION_SPRAY | Freq: Two times a day (BID) | RESPIRATORY_TRACT | 5 refills | Status: DC

## 2016-11-06 NOTE — Progress Notes (Signed)
Vincent Robinson    [id number]    1923-08-21  Primary Care Physician:BRISCOE, Maudie Mercury, MD  Referring Physician: No referring provider defined for this encounter.  Chief complaint:   Follow up for COPD Mild ILD  HPI: Vincent Robinson is a delightful 83 year old with past history of COPD, hypersomnia with sleep apnea, allergic rhinitis, cough variant asthma. He recently moved from [place] to be close to his family and wants to establish care here. He is chief complaints of dyspnea on exertion. He has noticed increasing cough over the past month with white sputum production. He denies any fevers, chills. He saw his primary care doctor Dr. Doreene Nest who started him on Symbicort. He is also due to get a high-resolution CT of the chest to evaluate interstitial abnormalities.  He has history of sleep apnea on BiPAP at settings of 20/6. This is report of CT of the chest from [place] which shows calcified nodule, chronic scarring. 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 He moved to [place] in 1956. Apparently his immigration was delayed due to a lung abnormality on imaging. He was eventually cleared. He does not have any history of TB, exposure to TB. He's had an episode of influenza while in [place].  Interim history: He continues to have stable symptoms. He has mild dyspnea on exertion which is unchanged. He has cough with white mucus for the past few days. He has been treating himself with Mucinex DM with improvement in symptoms. He denies any cough, fevers, wheezing, chest pain, palpitation  Outpatient Encounter Prescriptions as of 11/06/2016  Medication Sig  . albuterol (PROVENTIL HFA;VENTOLIN HFA) 108 (90 Base) MCG/ACT inhaler Inhale 1 puff into the lungs 2 (two) times daily.   Marland Kitchen aspirin (ASPIRIN 81) 81 MG chewable tablet Chew 81 mg by mouth daily.  .  budesonide-formoterol (SYMBICORT) 160-4.5 MCG/ACT inhaler Inhale 2 puffs into the lungs 2 (two) times daily.  . Fluorouracil 4 % CREA Apply 5 L topically.  . fluticasone (VERAMYST) 27.5 MCG/SPRAY nasal spray Place 1 spray into the nose daily.  Marland Kitchen ketoconazole (NIZORAL) 2 % cream Apply 1 application topically daily.  Marland Kitchen levofloxacin (LEVAQUIN) 500 MG tablet Take 500 mg by mouth daily.  Marland Kitchen levothyroxine (SYNTHROID, LEVOTHROID) 112 MCG tablet Take 112 mcg by mouth daily before breakfast.  . montelukast (SINGULAIR) 10 MG tablet Take 10 mg by mouth at bedtime.  Marland Kitchen oxybutynin (DITROPAN) 5 MG tablet Take 5 mg by mouth every 8 (eight) hours as needed for bladder spasms.  . simvastatin (ZOCOR) 20 MG tablet Take 10 mg by mouth daily.   No facility-administered encounter medications on file as of 11/06/2016.     Allergies as of 11/06/2016  . (Not on File)    Past Medical History:  Diagnosis Date  . Asthma   . COPD (chronic obstructive pulmonary disease) ([place])   . Skin cancer 2017  . Sleep apnea     Past Surgical History:  Procedure Laterality Date  . bleeding ulcer  2016  . BYPASS AXILLA/BRACHIAL ARTERY  1998  . colon bleed  2012  . DEBRIDMENT OF DECUBITUS ULCER  2002  . THORACIC AORTA STENT  2009    No family history on file.  Social History   Social History  . Marital status: Married    Spouse name: N/A  .  Number of children: N/A  . Years of education: N/A   Occupational History  . Not on file.   Social History Main Topics  . Smoking status: Former Smoker    Packs/day: 0.50    Quit date: 08/06/1961  . Smokeless tobacco: Never Used  . Alcohol use Not on file  . Drug use: Unknown  . Sexual activity: Not on file   Other Topics Concern  . Not on file   Social History Narrative  . No narrative on file    Review of systems: Review of Systems  Constitutional: Negative for fever and chills.  HENT: Negative.   Eyes: Negative for blurred vision.  Respiratory: as per HPI    Cardiovascular: Negative for chest pain and palpitations.  Gastrointestinal: Negative for vomiting, diarrhea, blood per rectum. Genitourinary: Negative for dysuria, urgency, frequency and hematuria.  Musculoskeletal: Negative for myalgias, back pain and joint pain.  Skin: Negative for itching and rash.  Neurological: Negative for dizziness, tremors, focal weakness, seizures and loss of consciousness.  Endo/Heme/Allergies: Negative for environmental allergies.  Psychiatric/Behavioral: Negative for depression, suicidal ideas and hallucinations.  All other systems reviewed and are negative.  Physical Exam: Blood pressure 122/82, pulse 71, height 5\' 8"  (1.727 m), weight 195 lb (88.5 kg), SpO2 90 %. Gen:      No acute distress HEENT:  EOMI, sclera anicteric Neck:     No masses; no thyromegaly Lungs:    Basal crackles; normal respiratory effort CV:         Regular rate and rhythm; no murmurs Abd:      + bowel sounds; soft, non-tender; no palpable masses, no distension Ext:    No edema; adequate peripheral perfusion Skin:      Warm and dry; no rash Neuro: alert and oriented x 3 Psych: normal mood and affect  Data Reviewed: PFTs 02/28/16 FVC 3.37 (116%) FEV1 2.08 (808%) F/F 62 Mild obstructive airway disease, no broncho-dilator response  CT scan 07/15/15- 2 .3 x 1.7 cm right upper lobe calcified opacity likely to represent pleural , parenchymal scarring. Mild centrilobular, paracentral emphysema. Subpleural reticulation, fibrosis representing mild chronic ILD High-resolution CT 09/04/16 - stable right upper lobe scarring, hyper aerated lungs. Minimal interstitial lung disease and peripheral unchanged compared to 2016.  BiPAP download December 2017-January 2018 Time spent greater than 4 hours- 97% AHI 7.2  Assessment:  #1 COPD, asthmatic bronchitis. PFTs reviewed which show mild obstruction. He has been maintained on Symbicort but is taking it just once a day. I have instructed him to  increase to 2 puffs twice daily. He's had some cough with mucus production for the past few days. I'll get a chest x-ray to evaluate. He has a prescription for Levaquin to use in case his symptoms get worse or he develops fevers.   #2 Abnormal CT scan, mild ILD Previous CTs have apparently shown a calcified RUL scarring and mild ILD. The repeat high res resolution CT shows stability of findings from 2016 - 2017. We will continue to follow this. I do not believe he'll need aggressive workup such as biopsy as the findings are stable and given his advanced stage.    #3 OSA He continues on BiPAP with good compliance. He is requesting new supplies and mask fitting.  Plan/Recommendations: - Continue symbicort. Increase to 2 puffs twice daily - Chest x-ray - Continue mucinex DM - Continue Bipap. Order new supplies and mask fitting  Marshell Garfinkel MD [facility] Pager [phone]  11/06/2016, 2:41 PM  CC: No ref. provider found

## 2016-11-06 NOTE — Patient Instructions (Signed)
We will schedule you for chest x-ray today Continue using the Symbicort. Need to take 2 puffs twice daily for maximum effect We will renew your Singulair and Symbicort prescriptions Continue using Mucinex DM for cough We will send an order to the DME company for BiPAP supplies and mask fitting.  Return in 6 months.

## 2016-11-14 NOTE — Progress Notes (Signed)
Spoke with patient and informed him of xray results. Pt had no further questions. Nothing further needed.

## 2016-12-12 ENCOUNTER — Telehealth: Payer: Self-pay | Admitting: Pulmonary Disease

## 2016-12-12 DIAGNOSIS — G4733 Obstructive sleep apnea (adult) (pediatric): Secondary | ICD-10-CM

## 2016-12-12 NOTE — Telephone Encounter (Signed)
Spoke with pt. He states that he wants his DME changed. States that he is unhappy with [facility]. Pt would like his BiPAP to be switched to [facility].  PM - please advise if this change would be okay. Thanks.

## 2016-12-13 NOTE — Telephone Encounter (Signed)
Ok to change DME as per patient preference  PM

## 2016-12-13 NOTE — Telephone Encounter (Signed)
Order has been placed for pt to be changed from [facility] to [facility].  Will forward to [facility] pool so this may be taken care of for the pt.

## 2016-12-17 ENCOUNTER — Telehealth: Payer: Self-pay | Admitting: Pulmonary Disease

## 2016-12-17 DIAGNOSIS — G4733 Obstructive sleep apnea (adult) (pediatric): Secondary | ICD-10-CM

## 2016-12-17 NOTE — Telephone Encounter (Signed)
Order has been placed on template for BiPAP. Nothing further was needed.

## 2017-02-26 ENCOUNTER — Telehealth: Payer: Self-pay | Admitting: Pulmonary Disease

## 2017-02-26 NOTE — Telephone Encounter (Signed)
Called and spoke with pt and he stated that he does not have the insurance coverage and he cannot afford the symbicort. I  Spoke with pt about pt assistance forms and he stated that he has not filled out any of these forms, but stated that he does feel that he would qualify for this either.  PM please advise. thanks

## 2017-02-28 NOTE — Telephone Encounter (Signed)
I was able to find a coupon for airduo respiclick 559 at [employer] for $51.14 monthly.  PM please advise on this dosage? Thanks.

## 2017-02-28 NOTE — Telephone Encounter (Signed)
See if he can get generic airduo with coupons  PM

## 2017-03-05 MED ORDER — FLUTICASONE-SALMETEROL 113-14 MCG/ACT IN AEPB: 1.0000 | INHALATION_SPRAY | Freq: Two times a day (BID) | RESPIRATORY_TRACT | 11 refills | Status: DC

## 2017-03-05 NOTE — Telephone Encounter (Signed)
PM please advise.thanks  

## 2017-03-05 NOTE — Telephone Encounter (Signed)
Pt is aware of below message and cost of medication with coupon. Pt states 51.14 is affordable. Rx and coupon has been placed in brown folder up front for pick up. Pt voiced his understanding. Nothing further needed.

## 2017-03-05 NOTE — Telephone Encounter (Signed)
Ok to use airduo 113

## 2017-04-21 ENCOUNTER — Other Ambulatory Visit: Payer: Self-pay | Admitting: Pulmonary Disease

## 2017-05-20 ENCOUNTER — Encounter (HOSPITAL_COMMUNITY): Payer: Self-pay | Admitting: Emergency Medicine

## 2017-05-20 ENCOUNTER — Ambulatory Visit (INDEPENDENT_AMBULATORY_CARE_PROVIDER_SITE_OTHER): Payer: Medicare Other | Admitting: Pulmonary Disease

## 2017-05-20 ENCOUNTER — Telehealth: Payer: Self-pay | Admitting: Pulmonary Disease

## 2017-05-20 ENCOUNTER — Encounter: Payer: Self-pay | Admitting: Pulmonary Disease

## 2017-05-20 ENCOUNTER — Emergency Department (HOSPITAL_COMMUNITY): Payer: Medicare Other

## 2017-05-20 ENCOUNTER — Ambulatory Visit (INDEPENDENT_AMBULATORY_CARE_PROVIDER_SITE_OTHER)
Admission: RE | Admit: 2017-05-20 | Discharge: 2017-05-20 | Disposition: A | Discharge status: Home / Self Care | Payer: Medicare Other | Source: Ambulatory Visit | Attending: Pulmonary Disease | Admitting: Pulmonary Disease

## 2017-05-20 ENCOUNTER — Inpatient Hospital Stay (HOSPITAL_COMMUNITY)
Admission: EM | Admit: 2017-05-20 | Discharge: 2017-05-23 | DRG: 200 | Disposition: A | Discharge status: Home / Self Care | Payer: Medicare Other | Attending: Pulmonary Disease | Admitting: Pulmonary Disease

## 2017-05-20 VITALS — BP 126/72 | HR 71 | Ht 67.0 in | Wt 187.2 lb

## 2017-05-20 DIAGNOSIS — J449 Chronic obstructive pulmonary disease, unspecified: Secondary | ICD-10-CM | POA: Diagnosis not present

## 2017-05-20 DIAGNOSIS — Z87891 Personal history of nicotine dependence: Secondary | ICD-10-CM

## 2017-05-20 DIAGNOSIS — Z79899 Other long term (current) drug therapy: Secondary | ICD-10-CM

## 2017-05-20 DIAGNOSIS — R0602 Shortness of breath: Secondary | ICD-10-CM

## 2017-05-20 DIAGNOSIS — G4733 Obstructive sleep apnea (adult) (pediatric): Secondary | ICD-10-CM | POA: Diagnosis present

## 2017-05-20 DIAGNOSIS — Z85828 Personal history of other malignant neoplasm of skin: Secondary | ICD-10-CM

## 2017-05-20 DIAGNOSIS — J9383 Other pneumothorax: Principal | ICD-10-CM | POA: Diagnosis present

## 2017-05-20 DIAGNOSIS — J939 Pneumothorax, unspecified: Secondary | ICD-10-CM | POA: Diagnosis not present

## 2017-05-20 DIAGNOSIS — J982 Interstitial emphysema: Secondary | ICD-10-CM | POA: Diagnosis present

## 2017-05-20 DIAGNOSIS — E785 Hyperlipidemia, unspecified: Secondary | ICD-10-CM | POA: Diagnosis present

## 2017-05-20 DIAGNOSIS — E039 Hypothyroidism, unspecified: Secondary | ICD-10-CM | POA: Diagnosis present

## 2017-05-20 DIAGNOSIS — J9611 Chronic respiratory failure with hypoxia: Secondary | ICD-10-CM | POA: Diagnosis present

## 2017-05-20 DIAGNOSIS — I2510 Atherosclerotic heart disease of native coronary artery without angina pectoris: Secondary | ICD-10-CM | POA: Diagnosis present

## 2017-05-20 DIAGNOSIS — Z951 Presence of aortocoronary bypass graft: Secondary | ICD-10-CM

## 2017-05-20 DIAGNOSIS — J9819 Other pulmonary collapse: Secondary | ICD-10-CM | POA: Diagnosis present

## 2017-05-20 LAB — BASIC METABOLIC PANEL
ANION GAP: 8 (ref 5–15)
BUN: 15 mg/dL (ref 6–20)
CALCIUM: 8.5 mg/dL — AB (ref 8.9–10.3)
CO2: 24 mmol/L (ref 22–32)
Chloride: 109 mmol/L (ref 101–111)
Creatinine, Ser: 1 mg/dL (ref 0.61–1.24)
GFR calc Af Amer: >60 mL/min (ref >60)
GLUCOSE: 83 mg/dL (ref 65–99)
Potassium: 3.9 mmol/L (ref 3.5–5.1)
SODIUM: 141 mmol/L (ref 135–145)

## 2017-05-20 LAB — CBC WITH DIFFERENTIAL/PLATELET
BASOS ABS: 0 10*3/uL (ref 0.0–0.1)
BASOS PCT: 0 %
EOS PCT: 2 %
Eosinophils Absolute: 0.2 10*3/uL (ref 0.0–0.7)
HEMATOCRIT: 42.4 % (ref 39.0–52.0)
Hemoglobin: 14.5 g/dL (ref 13.0–17.0)
Lymphocytes Relative: 29 %
Lymphs Abs: 2.8 10*3/uL (ref 0.7–4.0)
MCH: 33.3 pg (ref 26.0–34.0)
MCHC: 34.2 g/dL (ref 30.0–36.0)
MCV: 97.5 fL (ref 78.0–100.0)
MONO ABS: 0.9 10*3/uL (ref 0.1–1.0)
Monocytes Relative: 10 %
NEUTROS ABS: 5.6 10*3/uL (ref 1.7–7.7)
Neutrophils Relative %: 59 %
PLATELETS: 290 10*3/uL (ref 150–400)
RBC: 4.35 MIL/uL (ref 4.22–5.81)
RDW: 14.6 % (ref 11.5–15.5)
WBC: 9.6 10*3/uL (ref 4.0–10.5)

## 2017-05-20 MED ORDER — ENSURE ENLIVE PO LIQD
237.0000 mL | Freq: Two times a day (BID) | ORAL | Status: DC
  Administered 2017-05-23: 237 mL via ORAL

## 2017-05-20 MED ORDER — DM-GUAIFENESIN ER 30-600 MG PO TB12
1.0000 | ORAL_TABLET | Freq: Two times a day (BID) | ORAL | Status: DC
  Administered 2017-05-20: 1 via ORAL
  Filled 2017-05-20 (×2): qty 1

## 2017-05-20 MED ORDER — BISACODYL 5 MG PO TBEC: 5.0000 mg | DELAYED_RELEASE_TABLET | Freq: Every day | ORAL | Status: DC | PRN

## 2017-05-20 MED ORDER — SODIUM CHLORIDE 0.9 % IV SOLN: 250.0000 mL | INTRAVENOUS | Status: DC | PRN

## 2017-05-20 MED ORDER — ENOXAPARIN SODIUM 40 MG/0.4ML ~~LOC~~ SOLN: 40.0000 mg | SUBCUTANEOUS | Status: DC

## 2017-05-20 MED ORDER — ACETAMINOPHEN 650 MG RE SUPP: 650.0000 mg | Freq: Four times a day (QID) | RECTAL | Status: DC | PRN

## 2017-05-20 MED ORDER — MOMETASONE FURO-FORMOTEROL FUM 100-5 MCG/ACT IN AERO
2.0000 | INHALATION_SPRAY | Freq: Two times a day (BID) | RESPIRATORY_TRACT | Status: DC
  Administered 2017-05-21 – 2017-05-22 (×4): 2 via RESPIRATORY_TRACT
  Filled 2017-05-20: qty 8.8

## 2017-05-20 MED ORDER — ALBUTEROL SULFATE (2.5 MG/3ML) 0.083% IN NEBU: 2.5000 mg | INHALATION_SOLUTION | Freq: Four times a day (QID) | RESPIRATORY_TRACT | Status: DC | PRN

## 2017-05-20 MED ORDER — FLUTICASONE-SALMETEROL 113-14 MCG/ACT IN AEPB: 1.0000 | INHALATION_SPRAY | Freq: Two times a day (BID) | RESPIRATORY_TRACT | Status: DC

## 2017-05-20 MED ORDER — ACETAMINOPHEN 325 MG PO TABS: 650.0000 mg | ORAL_TABLET | Freq: Four times a day (QID) | ORAL | Status: DC | PRN

## 2017-05-20 MED ORDER — ONDANSETRON HCL 4 MG/2ML IJ SOLN: 4.0000 mg | Freq: Four times a day (QID) | INTRAMUSCULAR | Status: DC | PRN

## 2017-05-20 MED ORDER — SENNOSIDES-DOCUSATE SODIUM 8.6-50 MG PO TABS: 1.0000 | ORAL_TABLET | Freq: Every evening | ORAL | Status: DC | PRN

## 2017-05-20 MED ORDER — SIMVASTATIN 10 MG PO TABS
10.0000 mg | ORAL_TABLET | Freq: Every day | ORAL | Status: DC
  Administered 2017-05-21 – 2017-05-22 (×2): 10 mg via ORAL
  Filled 2017-05-20 (×3): qty 1

## 2017-05-20 MED ORDER — LEVOTHYROXINE SODIUM 112 MCG PO TABS
112.0000 ug | ORAL_TABLET | Freq: Every day | ORAL | Status: DC
  Administered 2017-05-21 – 2017-05-23 (×3): 112 ug via ORAL
  Filled 2017-05-20 (×3): qty 1

## 2017-05-20 MED ORDER — SODIUM CHLORIDE 0.9% FLUSH
3.0000 mL | Freq: Two times a day (BID) | INTRAVENOUS | Status: DC
  Administered 2017-05-20 – 2017-05-23 (×5): 3 mL via INTRAVENOUS

## 2017-05-20 MED ORDER — ONDANSETRON HCL 4 MG PO TABS: 4.0000 mg | ORAL_TABLET | Freq: Four times a day (QID) | ORAL | Status: DC | PRN

## 2017-05-20 MED ORDER — HYDROCODONE-ACETAMINOPHEN 5-325 MG PO TABS
1.0000 | ORAL_TABLET | ORAL | Status: DC | PRN
Start: 2017-05-20 — End: 2017-05-23

## 2017-05-20 MED ORDER — FLUTICASONE PROPIONATE 50 MCG/ACT NA SUSP
1.0000 | Freq: Every day | NASAL | Status: DC
  Administered 2017-05-21 – 2017-05-23 (×3): 1 via NASAL
  Filled 2017-05-20: qty 16

## 2017-05-20 MED ORDER — ASPIRIN 81 MG PO CHEW
81.0000 mg | CHEWABLE_TABLET | Freq: Every day | ORAL | Status: DC
  Administered 2017-05-21 – 2017-05-23 (×2): 81 mg via ORAL
  Filled 2017-05-20 (×3): qty 1

## 2017-05-20 MED ORDER — SODIUM CHLORIDE 0.9% FLUSH: 3.0000 mL | INTRAVENOUS | Status: DC | PRN

## 2017-05-20 MED ORDER — SODIUM CHLORIDE 0.9% FLUSH
3.0000 mL | Freq: Two times a day (BID) | INTRAVENOUS | Status: DC
  Administered 2017-05-20 – 2017-05-23 (×4): 3 mL via INTRAVENOUS

## 2017-05-20 MED ORDER — ALBUTEROL SULFATE HFA 108 (90 BASE) MCG/ACT IN AERS: 1.0000 | INHALATION_SPRAY | Freq: Two times a day (BID) | RESPIRATORY_TRACT | 2 refills | Status: DC

## 2017-05-20 MED ORDER — HEPARIN SODIUM (PORCINE) 5000 UNIT/ML IJ SOLN: 5000.0000 [IU] | Freq: Three times a day (TID) | INTRAMUSCULAR | Status: DC

## 2017-05-20 MED ORDER — MONTELUKAST SODIUM 10 MG PO TABS
10.0000 mg | ORAL_TABLET | Freq: Every day | ORAL | Status: DC
  Administered 2017-05-20 – 2017-05-22 (×3): 10 mg via ORAL
  Filled 2017-05-20 (×4): qty 1

## 2017-05-20 NOTE — Telephone Encounter (Signed)
Per Dr Nyoka Cowden - Radiology the CXR done today shows Right Pneumothorax.  No impression in EPIC yet.  Will send to Dr Vaughan Browner as Juluis Rainier.

## 2017-05-20 NOTE — ED Notes (Signed)
ED TO INPATIENT HANDOFF REPORT  Name/Age/Gender Vincent Robinson 83 y.o. male  Home/SNF/Other    Chief Complaint poss colapsed lung     Code Status Orders        Start     Ordered   05/20/17 2052  Full code  Continuous     05/20/17 2052    Code Status History    Date Active Date Inactive Code Status Order ID Comments User Context   05/20/2017  8:40 PM 05/20/2017  8:52 PM Full Code [id number]  Vianne Bulls, MD ED      Level of Care/Admitting Diagnosis ED Disposition    ED Disposition Condition Comment   [facility]: [facility] [[id number]]  Level of Care: Med-Surg [16]  Diagnosis: Pneumothorax [366294]  Admitting Physician: [place], [place]  Attending Physician: Simonne Maffucci B [[id number]  PT Class (Do Not Modify): Observation [104]  PT Acc Code (Do Not Modify): Observation [10022]       Medical History Past Medical History:  Diagnosis Date  . Asthma   . COPD (chronic obstructive pulmonary disease) ([place])   . Skin cancer 2017  . Sleep apnea     Allergies No Known Allergies  IV Location/Drains/Wounds Patient Lines/Drains/Airways Status   Active Line/Drains/Airways    Name:   Placement date:   Placement time:   Site:   Days:   Peripheral IV 05/20/17 Right Antecubital  05/20/17    2012    Antecubital    less than 1          Labs/Imaging Results for orders placed or performed during the hospital encounter of 05/20/17 (from the past 48 hour(s))  Basic metabolic panel     Status: Abnormal   Collection Time: 05/20/17  8:08 PM  Result Value Ref Range   Sodium 141 135 - 145 mmol/L   Potassium 3.9 3.5 - 5.1 mmol/L   Chloride 109 101 - 111 mmol/L   CO2 24 22 - 32 mmol/L   Glucose, Bld 83 65 - 99 mg/dL   BUN 15 6 - 20 mg/dL   Creatinine, Ser 1.00 0.61 - 1.24 mg/dL   Calcium 8.5 (L) 8.9 - 10.3 mg/dL   GFR calc non Af Amer >60 >60 mL/min   GFR calc Af Amer >60 >60 mL/min    Comment: (NOTE) The eGFR has been calculated  using the CKD EPI equation. This calculation has not been validated in all clinical situations. eGFR's persistently <60 mL/min signify possible Chronic Kidney Disease.    Anion gap 8 5 - 15  CBC with Differential     Status: None   Collection Time: 05/20/17  8:08 PM  Result Value Ref Range   WBC 9.6 4.0 - 10.5 K/uL   RBC 4.35 4.22 - 5.81 MIL/uL   Hemoglobin 14.5 13.0 - 17.0 g/dL   HCT 42.4 39.0 - 52.0 %   MCV 97.5 78.0 - 100.0 fL   MCH 33.3 26.0 - 34.0 pg   MCHC 34.2 30.0 - 36.0 g/dL   RDW 14.6 11.5 - 15.5 %   Platelets 290 150 - 400 K/uL   Neutrophils Relative % 59 %   Neutro Abs 5.6 1.7 - 7.7 K/uL   Lymphocytes Relative 29 %   Lymphs Abs 2.8 0.7 - 4.0 K/uL   Monocytes Relative 10 %   Monocytes Absolute 0.9 0.1 - 1.0 K/uL   Eosinophils Relative 2 %   Eosinophils Absolute 0.2 0.0 - 0.7 K/uL   Basophils  Relative 0 %   Basophils Absolute 0.0 0.0 - 0.1 K/uL   Dg Chest 2 View  Result Date: 05/20/2017 CLINICAL DATA:  Shortness of breath. EXAM: CHEST  2 VIEW COMPARISON:  Radiographs of November 06, 2016. FINDINGS: Stable cardiomediastinal silhouette. Atherosclerosis of thoracic aorta is noted. Sternotomy wires are noted. Stable interstitial densities are noted throughout both lungs most consistent with scarring. Minimal right pleural effusion is noted. Mild to moderate right apical pneumothorax is noted which is new since prior exam. Bony thorax is unremarkable. IMPRESSION: Interval development of mild to moderate right apical pneumothorax. Critical Value/emergent results were called by telephone at the time of interpretation on 05/20/2017 at 4:32 pm to [place], who verbally acknowledged these results and will immediately contact Dr. Vaughan Browner with this finding. Aortic atherosclerosis. Minimal right pleural effusion. Electronically Signed   By: Marijo Conception, M.D.   On: 05/20/2017 16:33   Ct Chest Wo Contrast  Result Date: 05/20/2017 CLINICAL DATA:  COPD, dyspnea on exertion and  chest x-ray today demonstrating spontaneous right-sided pneumothorax. EXAM: CT CHEST WITHOUT CONTRAST TECHNIQUE: Multidetector CT imaging of the chest was performed following the standard protocol without IV contrast. COMPARISON:  Chest x-ray earlier today at [facility] and prior CTA of the chest on 07/15/2015 at [facility]. FINDINGS: Cardiovascular: Stable heart size and evidence of prior CABG. The ascending thoracic aorta is mildly dilated and measures up to 4.6 cm in greatest diameter. The proximal arch measures 3.7 cm. The distal arch measures 3.1 cm. The descending thoracic aorta measures 2.8 cm. No pericardial fluid. Mediastinum/Nodes: No mediastinal, hilar or axillary lymphadenopathy identified. No mediastinal masses. Trachea and esophagus appear grossly unremarkable. Lungs/Pleura: There is evidence of a right sided pneumothorax with apical, lower lateral and basilar components. There is a small amount of associated pleural fluid. Overall volume of pneumothorax estimated to be roughly 20%. No evidence to suggest obvious tension pneumothorax. There is evidence of underlying chronic interstitial lung disease with interstitial thickening present as well as probable early fibrotic changes at the lung bases and mild lower lobe bronchiectasis bilaterally. No significant blebs are present. Stable parenchymal scarring is present at the posterior right upper lung abutting the pleura and demonstrating partial calcification. This area appears stable and has not enlarged since 2016. No parenchymal airspace consolidation or focal mass lesions identified. Upper Abdomen: No acute abnormality. Musculoskeletal: No chest wall mass or suspicious bone lesions identified. IMPRESSION: 1. Right-sided pneumothorax with apical, lateral and basilar. Overall volume estimated to be roughly 20%. 2. Stable underlying chronic interstitial lung disease with probable early pulmonary fibrosis of the lower lobes bilaterally  and evidence of mild lower lobe bronchiectasis. 3. Stable posterior right upper lobe pleuroparenchymal scarring. 4. Aneurysmal disease of the ascending thoracic aorta which measures up to 4.6 cm in greatest diameter. Ascending thoracic aortic aneurysm. Recommend semi-annual imaging followup by CTA or MRA and referral to cardiothoracic surgery if not already obtained. This recommendation follows 2010 ACCF/AHA/AATS/ACR/ASA/SCA/SCAI/SIR/STS/SVM Guidelines for the Diagnosis and Management of Patients With Thoracic Aortic Disease. Circulation. 2010; 121: A416-S063 Aortic aneurysm NOS (ICD10-I71.9). Electronically Signed   By: Aletta Edouard M.D.   On: 05/20/2017 21:30    Pending Labs Unresulted Labs    Start     Ordered   05/21/17 0500  CBC  Tomorrow morning,   R     05/20/17 2040   05/21/17 0160  Basic metabolic panel  Tomorrow morning,   R     05/20/17 2040  Isolation Precautions No active isolations  Vitals/Pain Today's Vitals   05/20/17 1925 05/20/17 1930 05/20/17 2011 05/20/17 2134  BP: (!) 145/82 (!) 145/82 (!) 151/99 (!) 156/82  Pulse: 82 87 77 77  Resp: 20 16 (!) 23 (!) 23  Temp:  98.2 F (36.8 C)    TempSrc:  Oral    SpO2: 91% 90% 91% 90%  Weight:      Height:      PainSc:        Medications Medications  montelukast (SINGULAIR) tablet 10 mg (not administered)  Fluticasone-Salmeterol 113-14 MCG/ACT AEPB 1 puff (not administered)  aspirin chewable tablet 81 mg (not administered)  fluticasone (VERAMYST) nasal spray 1 spray (not administered)  levothyroxine (SYNTHROID, LEVOTHROID) tablet 112 mcg (not administered)  simvastatin (ZOCOR) tablet 10 mg (not administered)  sodium chloride flush (NS) 0.9 % injection 3 mL (not administered)  sodium chloride flush (NS) 0.9 % injection 3 mL (not administered)  0.9 %  sodium chloride infusion (not administered)  sodium chloride flush (NS) 0.9 % injection 3 mL (not administered)  acetaminophen (TYLENOL) tablet 650 mg (not  administered)    Or  acetaminophen (TYLENOL) suppository 650 mg (not administered)  HYDROcodone-acetaminophen (NORCO/VICODIN) 5-325 MG per tablet 1-2 tablet (not administered)  senna-docusate (Senokot-S) tablet 1 tablet (not administered)  bisacodyl (DULCOLAX) EC tablet 5 mg (not administered)  ondansetron (ZOFRAN) tablet 4 mg (not administered)    Or  ondansetron (ZOFRAN) injection 4 mg (not administered)  albuterol (PROVENTIL) (2.5 MG/3ML) 0.083% nebulizer solution 2.5 mg (not administered)  dextromethorphan-guaiFENesin (MUCINEX DM) 30-600 MG per 12 hr tablet 1 tablet (not administered)    Mobility walks

## 2017-05-20 NOTE — Telephone Encounter (Signed)
Called and spoke to [place] with [facility]. He states the [facility] note was missing the ambulatory sats, this was corrected by [facility]. Corene Cornea is aware, he verbalized understanding and states they are just waiting on the MD to sign the rx. Nothing further needed at this time. Will sign off.

## 2017-05-20 NOTE — ED Notes (Signed)
ED Provider at bedside. 

## 2017-05-20 NOTE — ED Notes (Signed)
Patient's daughter, Jackelyn Poling, would like to be called for updates. [phone]

## 2017-05-20 NOTE — Progress Notes (Signed)
Vincent Robinson    [id number]    [birth year]  Primary Care Physician:B[place], Jannifer Rodney, MD  Referring Physician: Katherina Mires, MD [address] [place], [place] [address]  Chief complaint:   Follow up for COPD Mild ILD  HPI: [name] is a delightful 83 year old with past history of COPD, hypersomnia with sleep apnea, allergic rhinitis, cough variant asthma. He recently moved from [place] to be close to his family and wants to establish care here. He is chief complaints of dyspnea on exertion. He has noticed increasing cough over the past month with white sputum production. He denies any fevers, chills. He saw his primary care doctor Dr. Doreene Nest who started him on Symbicort. He is also due to get a high-resolution CT of the chest to evaluate interstitial abnormalities.  He has history of sleep apnea on BiPAP at settings of 20/6. This is report of CT of the chest from [place] which shows calcified nodule, chronic scarring. 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 He moved to [place] in 1956. Apparently his immigration was delayed due to a lung abnormality on imaging. He was eventually cleared. He does not have any history of TB, exposure to TB. He's had an episode of influenza while in [place].  Interim history: Has slight of worsening dyspnea since last visit. He reports cough with white mucus. He is using airduo twice daily and albuterol rescue inhalers. He was given a course of Levaquin last month for asthmatic bronchitis.  Outpatient Encounter Prescriptions as of 05/20/2017  Medication Sig  . albuterol (PROVENTIL HFA;VENTOLIN HFA) 108 (90 Base) MCG/ACT inhaler Inhale 1 puff into the lungs 2 (two) times daily.   Marland Kitchen aspirin (ASPIRIN 81) 81 MG chewable tablet Chew 81 mg by mouth daily.  . Fluorouracil 4 % CREA Apply 5 L topically.  .  fluticasone (VERAMYST) 27.5 MCG/SPRAY nasal spray Place 1 spray into the nose daily.  . Fluticasone-Salmeterol (AIRDUO RESPICLICK 462/86) 381-77 MCG/ACT AEPB Inhale 1 puff into the lungs 2 (two) times daily.  Marland Kitchen ketoconazole (NIZORAL) 2 % cream Apply 1 application topically daily.  Marland Kitchen levofloxacin (LEVAQUIN) 500 MG tablet Take 500 mg by mouth daily.  Marland Kitchen levothyroxine (SYNTHROID, LEVOTHROID) 112 MCG tablet Take 112 mcg by mouth daily before breakfast.  . montelukast (SINGULAIR) 10 MG tablet TAKE ONE TABLET BY MOUTH AT BEDTIME  . oxybutynin (DITROPAN) 5 MG tablet Take 5 mg by mouth every 8 (eight) hours as needed for bladder spasms.  . simvastatin (ZOCOR) 20 MG tablet Take 10 mg by mouth daily.  . [DISCONTINUED] budesonide-formoterol (SYMBICORT) 160-4.5 MCG/ACT inhaler Inhale 2 puffs into the lungs 2 (two) times daily.   No facility-administered encounter medications on file as of 05/20/2017.     Allergies as of 05/20/2017  . (Not on File)    Past Medical History:  Diagnosis Date  . Asthma   . COPD (chronic obstructive pulmonary disease) ([place])   . Skin cancer 2017  . Sleep apnea     Past Surgical History:  Procedure Laterality Date  . bleeding ulcer  2016  . BYPASS AXILLA/BRACHIAL ARTERY  1998  . colon bleed  2012  . DEBRIDMENT OF DECUBITUS ULCER  2002  . THORACIC AORTA STENT  2009    No family history on file.  Social History  Social History  . Marital status: Married    Spouse name: N/A  . Number of children: N/A  . Years of education: N/A   Occupational History  . Not on file.   Social History Main Topics  . Smoking status: Former Smoker    Packs/day: 0.50    Quit date: 08/06/1961  . Smokeless tobacco: Never Used  . Alcohol use Not on file  . Drug use: Unknown  . Sexual activity: Not on file   Other Topics Concern  . Not on file   Social History Narrative  . No narrative on file    Review of systems: Review of Systems  Constitutional: Negative for fever  and chills.  HENT: Negative.   Eyes: Negative for blurred vision.  Respiratory: as per HPI  Cardiovascular: Negative for chest pain and palpitations.  Gastrointestinal: Negative for vomiting, diarrhea, blood per rectum. Genitourinary: Negative for dysuria, urgency, frequency and hematuria.  Musculoskeletal: Negative for myalgias, back pain and joint pain.  Skin: Negative for itching and rash.  Neurological: Negative for dizziness, tremors, focal weakness, seizures and loss of consciousness.  Endo/Heme/Allergies: Negative for environmental allergies.  Psychiatric/Behavioral: Negative for depression, suicidal ideas and hallucinations.  All other systems reviewed and are negative.  Physical Exam: Blood pressure 122/82, pulse 71, height 5\' 8"  (1.727 m), weight 195 lb (88.5 kg), SpO2 90 %. Gen:      No acute distress HEENT:  EOMI, sclera anicteric Neck:     No masses; no thyromegaly Lungs:    Basal crackles; normal respiratory effort CV:         Regular rate and rhythm; no murmurs Abd:      + bowel sounds; soft, non-tender; no palpable masses, no distension Ext:    No edema; adequate peripheral perfusion Skin:      Warm and dry; no rash Neuro: alert and oriented x 3 Psych: normal mood and affect  Data Reviewed: PFTs 02/28/16 FVC 3.37 (116%) FEV1 2.08 (808%) F/F 62 Mild obstructive airway disease, no broncho-dilator response  CT scan 07/15/15- 2 .3 x 1.7 cm right upper lobe calcified opacity likely to represent pleural , parenchymal scarring. Mild centrilobular, paraseptal emphysema. Subpleural reticulation, fibrosis representing mild chronic ILD High-resolution CT 09/04/16 - stable right upper lobe scarring, hyper aerated lungs. Minimal interstitial lung disease and peripheral unchanged compared to 2016.  BiPAP download December 2017-January 2018 Time spent greater than 4 hours- 97% AHI 7.2  Assessment:  #1 COPD, asthmatic bronchitis. PFTs reviewed which show mild obstruction and  he has emphysema on prior imaging. He continues on airduo and albuterol inhalers. His sat's are low in office today and he will qualify for supplemental oxygen. He is reluctant to start supplemental o2 but has agreed to try it. He is requesting the use of a portable concentrator.  We'll get an x-ray to make sure there is no new infiltrate or worsening of his interstitial lung disease   #2 Abnormal CT scan, mild ILD Previous CTs have apparently shown a calcified RUL scarring and mild ILD. The repeat high res resolution CT shows stability of findings from 2016 - 2017. We will continue to follow this. I do not believe he'll need aggressive workup such as biopsy as the findings are stable and given his advanced stage.    #3 OSA He continues on BiPAP with good compliance. .  Plan/Recommendations: - Continue airduo - Prescribe supplemental O2 - Chest x-ray - Continue mucinex DM - Continue Bipap.  Marshell Garfinkel MD [facility] Pager [phone] 05/20/2017, 2:03 PM  CC: Katherina Mires, MD

## 2017-05-20 NOTE — Telephone Encounter (Signed)
Made Dr. Vaughan Browner aware of pt's cxr results and per Dr. Vaughan Browner, pt needs to go to closest ER. Called pt regarding his cxr results and that Dr. Vaughan Browner stated that he needed to go to the closest emergency room. Pt expressed understanding and was going to go to the closest ER to him.

## 2017-05-20 NOTE — ED Triage Notes (Signed)
Patient reports he was sent from [place] [facility] for "collapsed right lung." Denies pain and SOB.

## 2017-05-20 NOTE — Patient Instructions (Signed)
We get a chest x-ray today to make sure there is no infiltrate We will start supplemental oxygen and order a portable concentrator Continue using the airduo and albuterol  Return to clinic in 1-2 months

## 2017-05-20 NOTE — ED Notes (Signed)
Patient transported to CT 

## 2017-05-20 NOTE — ED Provider Notes (Signed)
[facility] DEPT Provider Note   CSN: [id number] Arrival date & time: 05/20/17  1843     History   Chief Complaint No chief complaint on file.   HPI Cam Dauphin is a 83 y.o. male.  The history is provided by the patient. No language interpreter was used.   Rockwell Zentz is a 83 y.o. male who presents to the Emergency Department complaining of collapsed lung.  He was referred to the emergency department from [facility] pulmonary for evaluation of collapsed lung.  He states that this was a routine appointment and he has had 1 month of progressive shortness of breath with cough and dyspnea on exertion. He denies any chest pain, fevers, vomiting. He has chronic left lower extremity edema, unchanged from baseline. He states he checks his oxygen sats at home regularly and it is around 83% and his been this way for the last several years. Past Medical History:  Diagnosis Date  . Asthma   . COPD (chronic obstructive pulmonary disease) ([place])   . Skin cancer 2017  . Sleep apnea     Patient Active Problem List   Diagnosis Date Noted  . Skin cancer 10/23/2015    Past Surgical History:  Procedure Laterality Date  . bleeding ulcer  2016  . BYPASS AXILLA/BRACHIAL ARTERY  1998  . colon bleed  2012  . DEBRIDMENT OF DECUBITUS ULCER  2002  . THORACIC AORTA STENT  2009       Home Medications    Prior to Admission medications   Medication Sig Start Date End Date Taking? Authorizing Provider  albuterol (PROVENTIL HFA;VENTOLIN HFA) 108 (90 Base) MCG/ACT inhaler Inhale 1 puff into the lungs 2 (two) times daily. 05/20/17   Mannam, [place] Robinsons, MD  aspirin (ASPIRIN 81) 81 MG chewable tablet Chew 81 mg by mouth daily.    [provider]  Fluorouracil 4 % CREA Apply 5 L topically.    [provider]  fluticasone (VERAMYST) 27.5 MCG/SPRAY nasal spray Place 1 spray into the nose daily.    [provider]  Fluticasone-Salmeterol (AIRDUO RESPICLICK 384/66) 599-35 MCG/ACT  AEPB Inhale 1 puff into the lungs 2 (two) times daily. 03/05/17   Mannam, Hart Robinsons, MD  ketoconazole (NIZORAL) 2 % cream Apply 1 application topically daily.    [provider]  levofloxacin (LEVAQUIN) 500 MG tablet Take 500 mg by mouth daily.    [provider]  levothyroxine (SYNTHROID, LEVOTHROID) 112 MCG tablet Take 112 mcg by mouth daily before breakfast.    [provider]  montelukast (SINGULAIR) 10 MG tablet TAKE ONE TABLET BY MOUTH AT BEDTIME 04/22/17   Mannam, Praveen, MD  oxybutynin (DITROPAN) 5 MG tablet Take 5 mg by mouth every 8 (eight) hours as needed for bladder spasms.    [provider]  simvastatin (ZOCOR) 20 MG tablet Take 10 mg by mouth daily.    [provider]    Family History No family history on file.  Social History Social History  Substance Use Topics  . Smoking status: Former Smoker    Packs/day: 0.50    Quit date: 08/06/1961  . Smokeless tobacco: Never Used  . Alcohol use Not on file     Allergies   Patient has no known allergies.   Review of Systems Review of Systems  All other systems reviewed and are negative.    Physical Exam Updated Vital Signs Ht 5\' 7"  (1.702 m)   Wt 84.8 kg (187 lb)   BMI 29.29 kg/m  Physical Exam  Constitutional: He is oriented to person, place, and time. He appears well-developed and well-nourished.  HENT:  Head: Normocephalic and atraumatic.  Cardiovascular: Normal rate and regular rhythm.   No murmur heard. Pulmonary/Chest: Effort normal. No respiratory distress.  Decreased air movement in bilateral upper lung fields.    Abdominal: Soft. There is no tenderness. There is no rebound and no guarding.  Musculoskeletal: He exhibits no tenderness.  Trace edema to LLE  Neurological: He is alert and oriented to person, place, and time.  Skin: Skin is warm and dry.  Psychiatric: He has a normal mood and affect. His behavior is normal.  Nursing note and vitals  reviewed.    ED Treatments / Results  Labs (all labs ordered are listed, but only abnormal results are displayed) Labs Reviewed - No data to display  EKG  EKG Interpretation None       Radiology Dg Chest 2 View  Result Date: 05/20/2017 CLINICAL DATA:  Shortness of breath. EXAM: CHEST  2 VIEW COMPARISON:  Radiographs of November 06, 2016. FINDINGS: Stable cardiomediastinal silhouette. Atherosclerosis of thoracic aorta is noted. Sternotomy wires are noted. Stable interstitial densities are noted throughout both lungs most consistent with scarring. Minimal right pleural effusion is noted. Mild to moderate right apical pneumothorax is noted which is new since prior exam. Bony thorax is unremarkable. IMPRESSION: Interval development of mild to moderate right apical pneumothorax. Critical Value/emergent results were called by telephone at the time of interpretation on 05/20/2017 at 4:32 pm to [place], who verbally acknowledged these results and will immediately contact Dr. Vaughan Browner with this finding. Aortic atherosclerosis. Minimal right pleural effusion. Electronically Signed   By: Marijo Conception, M.D.   On: 05/20/2017 16:33    Procedures Procedures (including critical care time)  Medications Ordered in ED Medications - No data to display   Initial Impression / Assessment and Plan / ED Course  I have reviewed the triage vital signs and the nursing notes.  Pertinent labs & imaging results that were available during my care of the patient were reviewed by me and considered in my medical decision making (see chart for details).    D/w CT surgeon - recommends PCCM/Pulmonary consult.  D/w Pulmonary, Dr. Nelda Marseille - recommends medicine admission for observation.    Patient referred from [facility] for pneumothorax on outpatient chest x-ray. He has had increased shortness of breath for the last month but is in no acute distress in the emergency department. Hospitalist  consulted for admission for observation.  Final Clinical Impressions(s) / ED Diagnoses   Final diagnoses:  Pneumothorax    New Prescriptions New Prescriptions   No medications on file     Quintella Reichert, MD 05/21/17 [phone]

## 2017-05-20 NOTE — H&P (Addendum)
Name: Vincent Robinson MRN: [id number] DOB: [birth year]    ADMISSION DATE:  05/20/2017 CONSULTATION DATE:  05/20/2017  REFERRING MD :  Dr. 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 Patient reports 2 month history of productive cough with white sputum production, and recently moved from [place] and has been lifting/unpacking furniture. Upon arrival to ED patient is on 3L [place] and is in no distress. PCCM asked to admit.    SIGNIFICANT EVENTS  7/30 > Presents to ED   STUDIES:  CXR 7/30 > moderate right apical pneumothorax  CT Chest 7/30 >>    PAST MEDICAL HISTORY :   has a past medical history of Asthma; COPD (chronic obstructive pulmonary disease) ([place]); Skin cancer (2017); and Sleep apnea.  has a past surgical history that includes Bypass axilla/brachial artery (1998); Debridment of decubitus ulcer (2002); Thoracic aorta stent (2009); colon bleed (2012); and bleeding ulcer (2016). Prior to Admission medications   Medication Sig Start Date End Date Taking? Authorizing Provider  albuterol (PROVENTIL HFA;VENTOLIN HFA) 108 (90 Base) MCG/ACT inhaler Inhale 1 puff into the lungs 2 (two) times daily. 05/20/17  Yes Mannam, Praveen, MD  aspirin (ASPIRIN 81) 81 MG chewable tablet Chew 81 mg by mouth daily.   Yes [provider]  fluticasone (VERAMYST) 27.5 MCG/SPRAY nasal spray Place 1 spray into the nose daily.   Yes [provider]  Fluticasone-Salmeterol (AIRDUO RESPICLICK 578/46) 962-95 MCG/ACT AEPB Inhale 1 puff into the lungs 2 (two) times daily. 03/05/17  Yes Mannam, Praveen, MD  levothyroxine (SYNTHROID, LEVOTHROID) 112 MCG tablet Take 112 mcg by mouth  daily before breakfast.   Yes [provider]  montelukast (SINGULAIR) 10 MG tablet TAKE ONE TABLET BY MOUTH AT BEDTIME 04/22/17  Yes Mannam, Praveen, MD  simvastatin (ZOCOR) 20 MG tablet Take 10 mg by mouth daily.   Yes [provider]  Fluorouracil 4 % CREA Apply 5 L topically.    [provider]  oxybutynin (DITROPAN) 5 MG tablet Take 5 mg by mouth every 8 (eight) hours as needed for bladder spasms.    [provider]   No Known Allergies  FAMILY HISTORY:  family history is not on file. SOCIAL HISTORY:  reports that he quit smoking about 55 years ago. He smoked 0.50 packs per day. He has never used smokeless tobacco.  REVIEW OF SYSTEMS:   All negative; except for those that are bolded, which indicate positives.  Constitutional: weight loss, weight gain, night sweats, fevers, chills, fatigue, weakness.  HEENT: headaches, sore throat, sneezing, nasal congestion, post nasal drip, difficulty swallowing, tooth/dental problems, visual complaints, visual changes, ear aches. Neuro: difficulty with speech, weakness, numbness, ataxia. CV:  chest pain, orthopnea, PND, swelling in lower extremities, dizziness, palpitations, syncope.  Resp: cough, hemoptysis, dyspnea, wheezing. GI: heartburn, indigestion, abdominal pain, nausea, vomiting, diarrhea, constipation, change in bowel habits, loss of appetite, hematemesis, melena, hematochezia.  GU: dysuria, change in color of urine, urgency or frequency, flank pain, hematuria. MSK: joint pain or swelling, decreased range of motion. Psych: change in mood or affect, depression, anxiety, suicidal ideations, homicidal ideations. Skin: rash, itching, bruising.  SUBJECTIVE:  VITAL SIGNS: Temp:  [98.2 F (36.8 C)] 98.2 F (36.8 C) (07/30 1930) Pulse Rate:  [71-87] 77 (07/30 2011) Resp:  [16-23] 23 (07/30 2011) BP: (126-151)/(72-99) 151/99 (07/30 2011) SpO2:  [90 %-91 %] 91 % (07/30 2011) Weight:  [84.8 kg (187  lb)-84.9 kg (187 lb 3.2 oz)] 84.8 kg (187 lb) (07/30 1848)  PHYSICAL EXAMINATION: General:  Elderly male, no distress  Neuro:  Alert, oriented, follows commands  HEENT:  normocephalic  Cardiovascular:  RRR, no MRG  Lungs:  No wheeze/crackles, non-labored  Abdomen:  Non-distended, active bowel sounds  Musculoskeletal:  -edema  Skin:  Dry, intact    Recent Labs Lab 05/20/17 2008  NA 141  K 3.9  CL 109  CO2 24  BUN 15  CREATININE 1.00  GLUCOSE 83    Recent Labs Lab 05/20/17 2008  HGB 14.5  HCT 42.4  WBC 9.6  PLT 290   Dg Chest 2 View  Result Date: 05/20/2017 CLINICAL DATA:  Shortness of breath. EXAM: CHEST  2 VIEW COMPARISON:  Radiographs of November 06, 2016. FINDINGS: Stable cardiomediastinal silhouette. Atherosclerosis of thoracic aorta is noted. Sternotomy wires are noted. Stable interstitial densities are noted throughout both lungs most consistent with scarring. Minimal right pleural effusion is noted. Mild to moderate right apical pneumothorax is noted which is new since prior exam. Bony thorax is unremarkable. IMPRESSION: Interval development of mild to moderate right apical pneumothorax. Critical Value/emergent results were called by telephone at the time of interpretation on 05/20/2017 at 4:32 pm to [place], who verbally acknowledged these results and will immediately contact Dr. Vaughan Browner with this finding. Aortic atherosclerosis. Minimal right pleural effusion. Electronically Signed   By: Marijo Conception, M.D.   On: 05/20/2017 16:33    ASSESSMENT / PLAN:  Right Apical Pneumothorax Plan  -Chest CT pending  -CXR at 0000 > will evaluate need for chest tube placement   Chronic Hypoxic Respiratory Failure  (Patient states that home therapy oxygen was ordered today)  H/O COPD, OSA, asthmatic bronchitis  Plan  -Maintain Oxygen Saturation >88  -BiPAP at HS (20/6) -Albuterol PRN -Continue Mucinex DM  -Continue Singulair   HLD Plan -Continue home Zocor    CAD S/P CABG Plan  -Continue ASA  Hypothyroidism  Plan  -Continue home synthroid   DVT prop: Lovenox  Diet: Regular   Hayden Pedro, AGACNP-BC [facility]  Pgr: [phone]  PCCM Pgr: [phone]    Pt seen and examined by me personally I have reviewed the labs and imaging for this patient.  I agree with the above note. Reviewed chart and of note patient has a pre-existing 4.6cm ascending thoracic aneurysm, reviewed labs and imaging noted on CT chest w/o contrast Right pneumothorax 20% volume by radiographic calculation, there is a thick rind along the posterior aspect of the right lung.  patient is not in any acute distress at this time  Recommend continuing supplemental oxygen via [place] Will not place a chest tube at this time   Spoke with patient and nurse at the bedside regarding condition   Kandice Hams MD PCCM Physician

## 2017-05-21 ENCOUNTER — Observation Stay (HOSPITAL_COMMUNITY): Payer: Medicare Other

## 2017-05-21 DIAGNOSIS — J9819 Other pulmonary collapse: Secondary | ICD-10-CM | POA: Diagnosis present

## 2017-05-21 DIAGNOSIS — G4733 Obstructive sleep apnea (adult) (pediatric): Secondary | ICD-10-CM | POA: Diagnosis present

## 2017-05-21 DIAGNOSIS — Z79899 Other long term (current) drug therapy: Secondary | ICD-10-CM | POA: Diagnosis not present

## 2017-05-21 DIAGNOSIS — J939 Pneumothorax, unspecified: Secondary | ICD-10-CM | POA: Diagnosis not present

## 2017-05-21 DIAGNOSIS — J449 Chronic obstructive pulmonary disease, unspecified: Secondary | ICD-10-CM | POA: Diagnosis present

## 2017-05-21 DIAGNOSIS — Z951 Presence of aortocoronary bypass graft: Secondary | ICD-10-CM | POA: Diagnosis not present

## 2017-05-21 DIAGNOSIS — I2510 Atherosclerotic heart disease of native coronary artery without angina pectoris: Secondary | ICD-10-CM | POA: Diagnosis present

## 2017-05-21 DIAGNOSIS — J84112 Idiopathic pulmonary fibrosis: Secondary | ICD-10-CM | POA: Diagnosis not present

## 2017-05-21 DIAGNOSIS — J9383 Other pneumothorax: Secondary | ICD-10-CM | POA: Diagnosis present

## 2017-05-21 DIAGNOSIS — E039 Hypothyroidism, unspecified: Secondary | ICD-10-CM | POA: Diagnosis present

## 2017-05-21 DIAGNOSIS — J9611 Chronic respiratory failure with hypoxia: Secondary | ICD-10-CM | POA: Diagnosis present

## 2017-05-21 DIAGNOSIS — Z87891 Personal history of nicotine dependence: Secondary | ICD-10-CM | POA: Diagnosis not present

## 2017-05-21 DIAGNOSIS — J982 Interstitial emphysema: Secondary | ICD-10-CM | POA: Diagnosis present

## 2017-05-21 DIAGNOSIS — E785 Hyperlipidemia, unspecified: Secondary | ICD-10-CM | POA: Diagnosis present

## 2017-05-21 DIAGNOSIS — Z85828 Personal history of other malignant neoplasm of skin: Secondary | ICD-10-CM | POA: Diagnosis not present

## 2017-05-21 LAB — CBC
HCT: 41.3 % (ref 39.0–52.0)
Hemoglobin: 13.9 g/dL (ref 13.0–17.0)
MCH: 32.9 pg (ref 26.0–34.0)
MCHC: 33.7 g/dL (ref 30.0–36.0)
MCV: 97.6 fL (ref 78.0–100.0)
PLATELETS: 278 10*3/uL (ref 150–400)
RBC: 4.23 MIL/uL (ref 4.22–5.81)
RDW: 14.8 % (ref 11.5–15.5)
WBC: 9.3 10*3/uL (ref 4.0–10.5)

## 2017-05-21 LAB — BASIC METABOLIC PANEL
ANION GAP: 9 (ref 5–15)
BUN: 15 mg/dL (ref 6–20)
CALCIUM: 8.5 mg/dL — AB (ref 8.9–10.3)
CHLORIDE: 107 mmol/L (ref 101–111)
CO2: 24 mmol/L (ref 22–32)
Creatinine, Ser: 0.88 mg/dL (ref 0.61–1.24)
GFR calc non Af Amer: >60 mL/min (ref >60)
GLUCOSE: 96 mg/dL (ref 65–99)
Potassium: 3.8 mmol/L (ref 3.5–5.1)
SODIUM: 140 mmol/L (ref 135–145)

## 2017-05-21 MED ORDER — DEXTROMETHORPHAN POLISTIREX ER 30 MG/5ML PO SUER
30.0000 mg | Freq: Two times a day (BID) | ORAL | Status: DC
  Administered 2017-05-21 – 2017-05-23 (×3): 30 mg via ORAL
  Filled 2017-05-21 (×5): qty 5

## 2017-05-21 NOTE — Care Management Obs Status (Signed)
[facility] NOTIFICATION   Patient Details  Name: Jaquane Boughner MRN: [id number] Date of Birth: Feb 06, 1923   Medicare Observation Status Notification Given:  Yes    MahabirJuliann Pulse, RN 05/21/2017, 1:03 PM

## 2017-05-21 NOTE — Progress Notes (Signed)
Contacted by CCMD re pt HR dropping into the 30's and lack of P wave. EKG performed and put into chart.

## 2017-05-21 NOTE — Care Management Note (Signed)
Case Management Note  Patient Details  Name: Vincent Robinson MRN: [id number] Date of Birth: April 23, 1923  Subjective/Objective:83 y/o m admitted w/pneumothorax. From home. [facility] was already following for home 02-patient never received yesterday-[facility] dme rep Leroy Sea already following for home 02, & portable concentrator(this will be set @ home)-will deliver home 02 travel tank to rm once confirmed home 02 order w//liter flow, & duration @ d/c. PT cons-await recc.                   Action/Plan:d/c plan home w/home 02.   Expected Discharge Date:   (unknown)               Expected Discharge Plan:  [name]  In-House Referral:     Discharge planning Services  CM Consult  Post Acute Care Choice:    Choice offered to:     DME Arranged:    DME Agency:     HH Arranged:    HH Agency:     Status of Service:  In process, will continue to follow  If discussed at Long Length of Stay Meetings, dates discussed:    Additional Comments:  Dessa Phi, RN 05/21/2017, 12:05 PM

## 2017-05-21 NOTE — Progress Notes (Signed)
Nutrition Brief Note  Patient identified on the Malnutrition Screening Tool (MST) Report Pt reports that he had gained weight over the last few years but had seen a decrease in his weight recently and feels that this has been because he has been more active while recently in the process of moving. Pt recently re-located here from [facility] with his wife. He reports good appetite and that he and his wife eat out a lot. No findings on physical exam except dry skin. Pt reports not drinking enough water but is trying to be better.   Wt Readings from Last 15 Encounters:  05/20/17 185 lb 3 oz (84 kg)  05/20/17 187 lb 3.2 oz (84.9 kg)  11/06/16 194 lb 12.8 oz (88.4 kg)  08/06/16 195 lb (88.5 kg)    Body mass index is 29 kg/m. Patient meets criteria for overweight based on current BMI.   Current diet order is Regular, patient is consuming approximately 100% of meals at this time. Labs and medications reviewed.   No nutrition interventions warranted at this time. If nutrition issues arise, please consult RD.   [place], [place], [place] Pager [phone] After Hours Pager

## 2017-05-21 NOTE — Progress Notes (Signed)
Name: Vincent Robinson MRN: [id number] DOB: 11-11-1922    ADMISSION DATE:  05/20/2017 CONSULTATION DATE:  05/20/2017  REFERRING MD :  Dr. Ralene Bathe   CHIEF COMPLAINT:  Right Pneumothorax   BRIEF SUMMARY:   83 year old male who presented to [facility] ER on 7/30 after a CXR in the pulmonary office revealed a right sided pneumothorax.  He reports a 2 month history of productive cough with white sputum production.  He recently moved from [place] and has been lifting / unpacking furniture.  On arrival to the ER, he was on 3L [place] and in no distress.   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   SUBJECTIVE:  Pt denies chest pain, SOB.  Reports he remembers coughing really hard once and thinks that is what caused the collapse.  States he has been coughing for some time with notable increase in SOB.  VITAL SIGNS: Temp:  [97.6 F (36.4 C)-98.2 F (36.8 C)] 97.6 F (36.4 C) (07/31 0441) Pulse Rate:  [71-87] 72 (07/31 0441) Resp:  [16-23] 17 (07/31 0441) BP: (126-158)/(72-99) 142/81 (07/31 0441) SpO2:  [90 %-93 %] 93 % (07/31 0813) Weight:  [185 lb 3 oz (84 kg)-187 lb 3.2 oz (84.9 kg)] 185 lb 3 oz (84 kg) (07/30 2200)  PHYSICAL EXAMINATION: General: pleasant elderly male in NAD HEENT: MM pink/moist, no jvd PSY: calm/appropriate  Neuro: AAOx4, speech clear, MAE CV: s1s2 rrr, SEM PULM: even/non-labored, 2L/[place], clear left anterior, basilar crackles, diminished on R GI: soft, non-tender, bsx4 active  Extremities: warm/dry, no edema  Skin: dry/flaky skin   Recent Labs Lab 05/20/17 2008 05/21/17 0424  NA 141 140  K 3.9 3.8  CL 109 107  CO2 24 24  BUN 15 15  CREATININE 1.00 0.88  GLUCOSE 83 96    Recent Labs Lab 05/20/17 2008 05/21/17 0424  HGB 14.5 13.9  HCT 42.4 41.3  WBC 9.6 9.3  PLT 290 278   Dg Chest 2 View  Result Date: 05/20/2017 CLINICAL  DATA:  Shortness of breath. EXAM: CHEST  2 VIEW COMPARISON:  Radiographs of November 06, 2016. FINDINGS: Stable cardiomediastinal silhouette. Atherosclerosis of thoracic aorta is noted. Sternotomy wires are noted. Stable interstitial densities are noted throughout both lungs most consistent with scarring. Minimal right pleural effusion is noted. Mild to moderate right apical pneumothorax is noted which is new since prior exam. Bony thorax is unremarkable. IMPRESSION: Interval development of mild to moderate right apical pneumothorax. Critical Value/emergent results were called by telephone at the time of interpretation on 05/20/2017 at 4:32 pm to [place], who verbally acknowledged these results and will immediately contact Dr. Vaughan Browner with this finding. Aortic atherosclerosis. Minimal right pleural effusion. Electronically Signed   By: Marijo Conception, M.D.   On: 05/20/2017 16:33   Ct Chest Wo Contrast  Result Date: 05/20/2017 CLINICAL DATA:  COPD, dyspnea on exertion and chest x-ray today demonstrating spontaneous right-sided pneumothorax. EXAM: CT CHEST WITHOUT CONTRAST TECHNIQUE: Multidetector CT imaging of the chest was performed following the standard protocol without IV contrast. COMPARISON:  Chest [facility]-ray earlier today at [facility] and prior CTA of the chest on 07/15/2015 at [facility]. FINDINGS: Cardiovascular: Stable heart size and evidence of prior CABG. The ascending thoracic aorta is mildly dilated and measures up to 4.6 cm in greatest diameter. The proximal  arch measures 3.7 cm. The distal arch measures 3.1 cm. The descending thoracic aorta measures 2.8 cm. No pericardial fluid. Mediastinum/Nodes: No mediastinal, hilar or axillary lymphadenopathy identified. No mediastinal masses. Trachea and esophagus appear grossly unremarkable. Lungs/Pleura: There is evidence of a right sided pneumothorax with apical, lower lateral and basilar components. There is a small amount of  associated pleural fluid. Overall volume of pneumothorax estimated to be roughly 20%. No evidence to suggest obvious tension pneumothorax. There is evidence of underlying chronic interstitial lung disease with interstitial thickening present as well as probable early fibrotic changes at the lung bases and mild lower lobe bronchiectasis bilaterally. No significant blebs are present. Stable parenchymal scarring is present at the posterior right upper lung abutting the pleura and demonstrating partial calcification. This area appears stable and has not enlarged since 2016. No parenchymal airspace consolidation or focal mass lesions identified. Upper Abdomen: No acute abnormality. Musculoskeletal: No chest wall mass or suspicious bone lesions identified. IMPRESSION: 1. Right-sided pneumothorax with apical, lateral and basilar. Overall volume estimated to be roughly 20%. 2. Stable underlying chronic interstitial lung disease with probable early pulmonary fibrosis of the lower lobes bilaterally and evidence of mild lower lobe bronchiectasis. 3. Stable posterior right upper lobe pleuroparenchymal scarring. 4. Aneurysmal disease of the ascending thoracic aorta which measures up to 4.6 cm in greatest diameter. Ascending thoracic aortic aneurysm. Recommend semi-annual imaging followup by CTA or MRA and referral to cardiothoracic surgery if not already obtained. This recommendation follows 2010 ACCF/AHA/AATS/ACR/ASA/SCA/SCAI/SIR/STS/SVM Guidelines for the Diagnosis and Management of Patients With Thoracic Aortic Disease. Circulation. 2010; 121: W656-C127 Aortic aneurysm NOS (ICD10-I71.9). Electronically Signed   By: Aletta Edouard M.D.   On: 05/20/2017 21:30   Dg Chest Port 1 View  Result Date: 05/21/2017 CLINICAL DATA:  Pneumothorax. EXAM: PORTABLE CHEST 1 VIEW COMPARISON:  CT 05/20/2017.  Chest x-ray 05/20/2017 . FINDINGS: Prior CABG. Stable cardiomegaly. No pulmonary venous congestion. Interim slight improvement of  right-sided pneumothorax. Small left pleural effusion. No acute bony abnormality . IMPRESSION: 1.  Interim slight improvement of right-sided pneumothorax. 2. Prior CABG.  Stable cardiomegaly . Electronically Signed   By: Marcello Moores  Register   On: 05/21/2017 06:46   SIGNIFICANT EVENTS  7/30  Presents to ED with pneumothorax  7/31  Slight improvement in pneumothorax  STUDIES:  CXR 7/30 >> moderate right apical pneumothorax  CT Chest 7/30 >> right sided pneumothorax, approx 20%, stable underlying chronic interstitial lung disease with probable early fibrosis, mild lower lobe bronchiectasis, stable posterior RUL scarring, aneurysmal disease of the ascending thoracic aorta (4.6 cm)   ASSESSMENT / PLAN:  Right Apical Pneumothorax - improving P: Follow serial CXR If improved or remains stable, may be able to discharge in am with close follow up in the pulmonary office this week No role for CT at this time Oxygen as below  Chronic Hypoxic Respiratory Failure - recently ordered O2 by Dr. Vaughan Browner (pending delivery) H/O COPD, OSA, asthmatic bronchitis  P: O2 to support sats > 88% Arrange for home portable concentrator Bipap QHS 20/6 > monitor closely with use Continue home Singulair PRN albuterol  Mucinex  HLD P: Continue home Zocor   CAD S/P CABG P: Continue ASA  Hypothyroidism  P: Continue home synthoid    DVT prop: Lovenox  Diet: Regular   Noe Gens, NP-C [facility] Pgr: [phone] or if no answer [phone] 05/21/2017, 10:51 AM

## 2017-05-22 ENCOUNTER — Inpatient Hospital Stay (HOSPITAL_COMMUNITY): Payer: Medicare Other

## 2017-05-22 DIAGNOSIS — J84112 Idiopathic pulmonary fibrosis: Secondary | ICD-10-CM

## 2017-05-22 NOTE — Progress Notes (Addendum)
SATURATION QUALIFICATIONS: (This note is used to comply with regulatory documentation for home oxygen)  Patient Saturations on Room Air at Rest = 91  Patient Saturations on Room Air while Ambulating = 88%  Patient Saturations on 2 Liters of oxygen while Ambulating = 92  Please briefly explain why patient needs home oxygen:

## 2017-05-22 NOTE — Progress Notes (Signed)
RT placed patient on BIPAP. Patient setting is 16/6. Sterile water added to water chamber for humidification. 3 liters oxygen bleed into tubing. Patient is tolerating well.

## 2017-05-22 NOTE — Progress Notes (Signed)
Name: Vincent Robinson MRN: [id number] DOB: May 30, 1923    ADMISSION DATE:  05/20/2017 CONSULTATION DATE:  05/20/2017  REFERRING MD :  Dr. Ralene Bathe   CHIEF COMPLAINT:  Right Pneumothorax   BRIEF SUMMARY:   83 year old male who presented to [facility] ER on 7/30 after a CXR in the pulmonary office revealed a right sided pneumothorax.  He reports a 2 month history of productive cough with white sputum production.  He recently moved from [place] and has been lifting / unpacking furniture.  On arrival to the ER, he was on 3L [place] and in no distress.   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   SUBJECTIVE:   No chest pain or dyspnea. Remains on 2 L oxygen  VITAL SIGNS: Temp:  [97.6 F (36.4 C)-98.1 F (36.7 C)] 97.9 F (36.6 C) (08/01 0605) Pulse Rate:  [69-87] 75 (08/01 0605) Resp:  [18] 18 (08/01 0605) BP: (111-146)/(77-88) 133/81 (08/01 0605) SpO2:  [93 %-98 %] 95 % (08/01 1106) Weight:  [182 lb 6.4 oz (82.7 kg)] 182 lb 6.4 oz (82.7 kg) (08/01 0605)  PHYSICAL EXAMINATION: General: pleasant elderly male in NAD HEENT: MM pink/moist, no jvd PSY: calm/appropriate  Neuro: AAOx4, speech clear, MAE CV: s1s2 rrr, SEM PULM: even/non-labored, 2L/[place], decreased on R GI: soft, non-tender, bsx4 active  Extremities: warm/dry, no edema  Skin: dry/flaky skin   Recent Labs Lab 05/20/17 2008 05/21/17 0424  NA 141 140  K 3.9 3.8  CL 109 107  CO2 24 24  BUN 15 15  CREATININE 1.00 0.88  GLUCOSE 83 96    Recent Labs Lab 05/20/17 2008 05/21/17 0424  HGB 14.5 13.9  HCT 42.4 41.3  WBC 9.6 9.3  PLT 290 278   Dg Chest 2 View  Result Date: 05/20/2017 CLINICAL DATA:  Shortness of breath. EXAM: CHEST  2 VIEW COMPARISON:  Radiographs of November 06, 2016. FINDINGS: Stable cardiomediastinal silhouette. Atherosclerosis of thoracic aorta is noted. Sternotomy  wires are noted. Stable interstitial densities are noted throughout both lungs most consistent with scarring. Minimal right pleural effusion is noted. Mild to moderate right apical pneumothorax is noted which is new since prior exam. Bony thorax is unremarkable. IMPRESSION: Interval development of mild to moderate right apical pneumothorax. Critical Value/emergent results were called by telephone at the time of interpretation on 05/20/2017 at 4:32 pm to [place], who verbally acknowledged these results and will immediately contact Dr. Vaughan Browner with this finding. Aortic atherosclerosis. Minimal right pleural effusion. Electronically Signed   By: Marijo Conception, M.D.   On: 05/20/2017 16:33   Ct Chest Wo Contrast  Result Date: 05/20/2017 CLINICAL DATA:  COPD, dyspnea on exertion and chest x-ray today demonstrating spontaneous right-sided pneumothorax. EXAM: CT CHEST WITHOUT CONTRAST TECHNIQUE: Multidetector CT imaging of the chest was performed following the standard protocol without IV contrast. COMPARISON:  Chest x-ray earlier today at [facility] and prior CTA of the chest on 07/15/2015 at [facility]. FINDINGS: Cardiovascular: Stable heart size and evidence of prior CABG. The ascending thoracic aorta is mildly dilated and measures up to 4.6 cm in greatest diameter. The proximal arch measures 3.7 cm. The distal arch measures 3.1 cm. The descending thoracic aorta measures 2.8 cm. No pericardial fluid. Mediastinum/Nodes: No mediastinal, hilar or axillary lymphadenopathy identified. No mediastinal masses. Trachea and esophagus  appear grossly unremarkable. Lungs/Pleura: There is evidence of a right sided pneumothorax with apical, lower lateral and basilar components. There is a small amount of associated pleural fluid. Overall volume of pneumothorax estimated to be roughly 20%. No evidence to suggest obvious tension pneumothorax. There is evidence of underlying chronic interstitial lung disease  with interstitial thickening present as well as probable early fibrotic changes at the lung bases and mild lower lobe bronchiectasis bilaterally. No significant blebs are present. Stable parenchymal scarring is present at the posterior right upper lung abutting the pleura and demonstrating partial calcification. This area appears stable and has not enlarged since 2016. No parenchymal airspace consolidation or focal mass lesions identified. Upper Abdomen: No acute abnormality. Musculoskeletal: No chest wall mass or suspicious bone lesions identified. IMPRESSION: 1. Right-sided pneumothorax with apical, lateral and basilar. Overall volume estimated to be roughly 20%. 2. Stable underlying chronic interstitial lung disease with probable early pulmonary fibrosis of the lower lobes bilaterally and evidence of mild lower lobe bronchiectasis. 3. Stable posterior right upper lobe pleuroparenchymal scarring. 4. Aneurysmal disease of the ascending thoracic aorta which measures up to 4.6 cm in greatest diameter. Ascending thoracic aortic aneurysm. Recommend semi-annual imaging followup by CTA or MRA and referral to cardiothoracic surgery if not already obtained. This recommendation follows 2010 ACCF/AHA/AATS/ACR/ASA/SCA/SCAI/SIR/STS/SVM Guidelines for the Diagnosis and Management of Patients With Thoracic Aortic Disease. Circulation. 2010; 121: G401-U272 Aortic aneurysm NOS (ICD10-I71.9). Electronically Signed   By: Aletta Edouard M.D.   On: 05/20/2017 21:30   Dg Chest Port 1 View  Result Date: 05/22/2017 CLINICAL DATA:  Pneumothorax. EXAM: PORTABLE CHEST 1 VIEW COMPARISON:  05/21/2017 . FINDINGS: Prior CABG. Heart size normal. Low lung volumes with mild basilar atelectasis and infiltrates. Small left pleural effusion. Right costophrenic angle not imaged. Tiny right apical pneumothorax. IMPRESSION: 1. Low lung volumes with mild bibasilar atelectasis and infiltrates. Small left pleural effusion 2.  Small right apical  pneumothorax.  No change. 3.  Prior CABG.  Heart size normal. Electronically Signed   By: Marcello Moores  Register   On: 05/22/2017 07:22   Dg Chest Port 1 View  Result Date: 05/21/2017 CLINICAL DATA:  Pneumothorax. EXAM: PORTABLE CHEST 1 VIEW COMPARISON:  CT 05/20/2017.  Chest x-ray 05/20/2017 . FINDINGS: Prior CABG. Stable cardiomegaly. No pulmonary venous congestion. Interim slight improvement of right-sided pneumothorax. Small left pleural effusion. No acute bony abnormality . IMPRESSION: 1.  Interim slight improvement of right-sided pneumothorax. 2. Prior CABG.  Stable cardiomegaly . Electronically Signed   By: Marcello Moores  Register   On: 05/21/2017 06:46   SIGNIFICANT EVENTS  7/30  Presents to ED with pneumothorax  7/31  Slight improvement in pneumothorax  STUDIES:  CXR 7/30 >> moderate right apical pneumothorax  CT Chest 7/30 >> right sided pneumothorax, approx 20%, stable underlying chronic interstitial lung disease with probable early fibrosis, mild lower lobe bronchiectasis, stable posterior RUL scarring, aneurysmal disease of the ascending thoracic aorta (4.6 cm)   ASSESSMENT / PLAN:  Right Apical Pneumothorax - stable P: Repeat chest x-ray tomorrow, can discharge home if stable or improved pneumothorax  Chronic Hypoxic Respiratory Failure - recently ordered O2 by Dr. Vaughan Browner (pending delivery) H/O COPD, OSA, asthmatic bronchitis  New diagnosis of ILD on CT scan this admission- likely underlying IPF P: O2 to support sats > 88% Home oxygen has been ordered and will be delivered on discharge Bipap QHS 20/6 > monitor closely with use Continue home Singulair PRN albuterol   Kara Mead MD. FCCP. [facility] Pager [phone] If no response call [phone]   05/22/2017, 12:36 PM

## 2017-05-22 NOTE — Care Management Note (Signed)
Case Management Note  Patient Details  Name: Keymani Mclean MRN: [id number] Date of Birth: 01/30/1923  Subjective/Objective: home 02 ordered. [facility] has delivered home 02 to patient's rm, portable home 02 tanks to be discussed once patient @ home. No further CM needs.                   Action/Plan:d/c home w/home 02.   Expected Discharge Date:   (unknown)               Expected Discharge Plan:  Home/Self Care  In-House Referral:     Discharge planning Services  CM Consult  Post Acute Care Choice:    Choice offered to:  Patient  DME Arranged:  Oxygen DME Agency:  [place]:    [place] Agency:     Status of Service:  Completed, signed off  If discussed at [place] of Stay Meetings, dates discussed:    Additional Comments:  Dessa Phi, RN 05/22/2017, 3:44 PM

## 2017-05-22 NOTE — Evaluation (Signed)
Physical Therapy Evaluation Patient Details Name: Vincent Robinson MRN: [id number] DOB: 1923-03-14 Today's Date: 05/22/2017    SATURATION QUALIFICATIONS: (This note is used to comply with regulatory documentation for home oxygen)  Patient Saturations on Room Air at Rest = 91%  Patient Saturations on Room Air while Ambulating = 88-89%  Patient Saturations on 2 Liters of oxygen while Ambulating = 92%     History of Present Illness  83 yo male admitted with R pneumothorax. Hx of COPD, sleep apnea, CABG  Clinical Impression  On eval, pt was Min guard assist for ambulation. He walked ~75'x 2. He moves well for his age. Recommend daily ambulation with nursing supervision during hospital stay. Do not anticipate any follow up PT needs at discharge.     Follow Up Recommendations No PT follow up    Equipment Recommendations  None recommended by PT    Recommendations for Other Services       Precautions / Restrictions Precautions Precautions: Fall Precaution Comments: monitor O2 sats  Restrictions Weight Bearing Restrictions: No      Mobility  Bed Mobility Overal bed mobility: Independent                Transfers Overall transfer level: Independent                  Ambulation/Gait Ambulation/Gait assistance: Min guard Ambulation Distance (Feet): 75 Feet (x2) Assistive device: None Gait Pattern/deviations: Step-through pattern     General Gait Details: fair gait speed. Pt tolerated distance well. Tripped x 1 during 2nd walk.   Stairs            Wheelchair Mobility    Modified Rankin (Stroke Patients Only)       Balance Overall balance assessment: Needs assistance         Standing balance support: During functional activity Standing balance-Leahy Scale: Good                               Pertinent Vitals/Pain Pain Assessment: No/denies pain    Home Living Family/patient expects to be discharged to:: Private residence Living  Arrangements: Spouse/significant other   Type of Home: House       Home Layout: One level Home Equipment: Cane - single point;Wheelchair - manual      Prior Function Level of Independence: Independent               Hand Dominance        Extremity/Trunk Assessment   Upper Extremity Assessment Upper Extremity Assessment: Overall WFL for tasks assessed    Lower Extremity Assessment Lower Extremity Assessment: Generalized weakness (chronic L ankle pain)    Cervical / Trunk Assessment Cervical / Trunk Assessment: Normal  Communication   Communication: No difficulties  Cognition Arousal/Alertness: Awake/alert Behavior During Therapy: WFL for tasks assessed/performed Overall Cognitive Status: Within Functional Limits for tasks assessed                                        General Comments      Exercises     Assessment/Plan    PT Assessment Patient needs continued PT services  PT Problem List Decreased mobility;Decreased activity tolerance       PT Treatment Interventions Gait training;Therapeutic activities;Therapeutic exercise;Patient/family education;Functional mobility training    PT Goals (Current goals can be found in the  Care Plan section)  Acute Rehab PT Goals Patient Stated Goal: home PT Goal Formulation: With patient Time For Goal Achievement: 06/05/17 Potential to Achieve Goals: Good    Frequency Min 3X/week   Barriers to discharge        Co-evaluation               AM-PAC PT "6 Clicks" Daily Activity  Outcome Measure Difficulty turning over in bed (including adjusting bedclothes, sheets and blankets)?: None Difficulty moving from lying on back to sitting on the side of the bed? : None Difficulty sitting down on and standing up from a chair with arms (e.g., wheelchair, bedside commode, etc,.)?: None Help needed moving to and from a bed to chair (including a wheelchair)?: None Help needed walking in hospital  room?: A Little Help needed climbing 3-5 steps with a railing? : A Little 6 Click Score: 22    End of Session Equipment Utilized During Treatment: Gait belt Activity Tolerance: Patient tolerated treatment well Patient left: in bed;with call bell/phone within reach;with bed alarm set   PT Visit Diagnosis: Muscle weakness (generalized) (M62.81);Difficulty in walking, not elsewhere classified (R26.2)    Time: 1152-1203 PT Time Calculation (min) (ACUTE ONLY): 11 min   Charges:   PT Evaluation $PT Eval Low Complexity: 1 Low     PT G Codes:         Weston Anna, MPT Pager: [phone]

## 2017-05-23 ENCOUNTER — Inpatient Hospital Stay (HOSPITAL_COMMUNITY): Payer: Medicare Other

## 2017-05-23 MED ORDER — DEXTROMETHORPHAN POLISTIREX ER 30 MG/5ML PO SUER: 30.0000 mg | Freq: Two times a day (BID) | ORAL | 0 refills | Status: DC

## 2017-05-23 NOTE — Progress Notes (Signed)
Physical Therapy Treatment and Discharge  Patient Details Name: Vincent Robinson MRN: [id number] DOB: October 21, 1923 Today's Date: 05/23/2017    History of Present Illness 83 yo male admitted with R pneumothorax. Hx of COPD, sleep apnea, CABG    PT Comments    Pt responded well to treatment. Pt able to ambulate 200 ft without an AD but required 2 L/min of O2 to keep SPO2 between 88 and 92%. Pt able to perform bed mobility and transfers independently as well as use the restroom independently. Pt does not demonstrate any additional need for PT.     Follow Up Recommendations  No PT follow up     Equipment Recommendations  None recommended by PT    Recommendations for Other Services       Precautions / Restrictions Precautions Precautions: Fall Precaution Comments: monitor O2 sats  Restrictions Weight Bearing Restrictions: No    Mobility  Bed Mobility Overal bed mobility: Independent                Transfers Overall transfer level: Independent                  Ambulation/Gait Ambulation/Gait assistance: Min guard Ambulation Distance (Feet): 200 Feet Assistive device: None Gait Pattern/deviations: WFL(Within Functional Limits)     General Gait Details: Pt tolerated ambulation well and increased distance from yesterday, pt ambulated with 2 L/min O2 and SPO2 remained between 88-92%   Stairs            Wheelchair Mobility    Modified Rankin (Stroke Patients Only)       Balance Overall balance assessment: Needs assistance   Sitting balance-Leahy Scale: Good     Standing balance support: During functional activity Standing balance-Leahy Scale: Good                 High Level Balance Comments: min guard and gait belt provided for safety but pt tolerated ambulation well, pt able to get out of bed and use restroom independently by end of session            Cognition Arousal/Alertness: Awake/alert Behavior During Therapy: WFL for tasks  assessed/performed Overall Cognitive Status: Within Functional Limits for tasks assessed                                        Exercises      General Comments        Pertinent Vitals/Pain Pain Assessment: No/denies pain    Home Living                      Prior Function            PT Goals (current goals can now be found in the care plan section) Progress towards PT goals: Goals met/education completed, patient discharged from PT    Frequency    Other (Comment) (Pt no longer needs to be seen by Acute therapy)      PT Plan Discharge plan needs to be updated    Co-evaluation              AM-PAC PT "6 Clicks" Daily Activity  Outcome Measure  Difficulty turning over in bed (including adjusting bedclothes, sheets and blankets)?: None Difficulty moving from lying on back to sitting on the side of the bed? : None Difficulty sitting down on and standing up from a chair with  arms (e.g., wheelchair, bedside commode, etc,.)?: None Help needed moving to and from a bed to chair (including a wheelchair)?: None Help needed walking in hospital room?: A Little Help needed climbing 3-5 steps with a railing? : A Little 6 Click Score: 22    End of Session Equipment Utilized During Treatment: Gait belt;Oxygen Activity Tolerance: Patient tolerated treatment well Patient left: in bed;with call bell/phone within reach (Spoke with RN under pt request, pt wanted bed alarm off so he could use the restroom) Nurse Communication: Mobility status PT Visit Diagnosis: Difficulty in walking, not elsewhere classified (R26.2)     Time: 1572-6203 PT Time Calculation (min) (ACUTE ONLY): 16 min  Charges:  $Gait Training: 8-22 mins                    G Codes:       Olegario Shearer, SPT   Reino Bellis 05/23/2017, 2:10 PM

## 2017-05-23 NOTE — Discharge Summary (Signed)
Physician Discharge Summary  Patient ID: Vincent Robinson MRN: [id number] DOB/AGE: 1923-06-30 83 y.o.  Admit date: 05/20/2017 Discharge date: 05/23/2017    Discharge Diagnoses:  Right Apical Pneumothorax  Chronic Hypoxic Respiratory Failure  ILD - new, noted on CT this admission, likely underlying IPF COPD - without acute exacerbation  OSA  Asthmatic Bronchitis                                                                       DISCHARGE PLAN BY DIAGNOSIS      Right Apical Pneumothorax  Chronic Hypoxic Respiratory Failure  ILD - new, noted on CT this admission, likely underlying IPF COPD - without acute exacerbation  OSA  Asthmatic Bronchitis   Discharge Plan: Continue previously arranged O2 as ordered  Continue home BiPAP QHS Resume home Singulair, Airduo Continue Delsym BID until seen in office for repeat CXR / cough suppression.  Avoid sedating medications with fall risk.  Follow up with pulmonary NP for repeat CXR on 8/6 Pt instructed to report to ER immediately if any changes in status                      DISCHARGE SUMMARY     83 y/o M, originally from [place], [place] admitted on 7/30 with a spontaneous right pneumothorax.  He carries a medical history of thoracic aneurysm, CABGx5, COPD and OSA on BiPAP.  The patient reported a two month history of increasing cough with white sputum production.  He recently moved from [place] and had been unpacking / lifting furniture.  The patient was evaluated with a chest xray which showed a moderate sized right apical pneumothorax.  CT chest evaluation demonstrated right sided pneumothorax, approx 20%, stable underlying chronic interstitial lung disease with probable early fibrosis, mild lower lobe bronchiectasis, stable posterior RUL scarring, aneurysmal disease of the ascending thoracic aorta (4.6 cm).  The patient was conservatively observed in regards to pneumothorax as he was in no distress.  He was continued on home  bronchodilator and BiPAP therapy.  Serial chest XRAY's were observed with slight reduction of pneumothorax.  Cough suppression with Delsym while inpatient.  CXR on 8/2 demonstrated no change in pneumothorax.  Previously ordered home O2 confirmed.  The patient was medically cleared for discharge 8/2 with plans as above.                 SIGNIFICANT EVENTS  7/30  Presents to ED with pneumothorax  7/31  Slight improvement in pneumothorax  STUDIES:  CXR 7/30 >> moderate right apical pneumothorax  CT Chest 7/30 >> right sided pneumothorax, approx 20%, stable underlying chronic interstitial lung disease with probable early fibrosis, mild lower lobe bronchiectasis, stable posterior RUL scarring, aneurysmal disease of the ascending thoracic aorta (4.6 cm)   Discharge Exam: General:  Well developed elderly male in NAD HEENT: MM pink/moist, no jvd PSY: pleasant, calm Neuro: AAOx4, speech clear, MAE CV: s1s2 rrr, SEM PULM: even/non-labored, lungs bilaterally clear, diminished on R YQ:MGNO, non-tender, bsx4 active  Extremities: warm/dry, no edema  Skin: no rashes or lesions, dry / flaky skin   Vitals:   05/22/17 1541 05/22/17 2009 05/22/17 2113 05/23/17 0515  BP: 129/78  124/73 140/89  Pulse: 76  65 66  Resp: '18  18 18  ' Temp: (!) 97.5 F (36.4 C)  97.9 F (36.6 C) 97.8 F (36.6 C)  TempSrc: Axillary  Oral Oral  SpO2: 95% 96% 96% 94%  Weight:      Height:         Discharge Labs  BMET  Recent Labs Lab 05/20/17 2008 05/21/17 0424  NA 141 140  K 3.9 3.8  CL 109 107  CO2 24 24  GLUCOSE 83 96  BUN 15 15  CREATININE 1.00 0.88  CALCIUM 8.5* 8.5*    CBC  Recent Labs Lab 05/20/17 2008 05/21/17 0424  HGB 14.5 13.9  HCT 42.4 41.3  WBC 9.6 9.3  PLT 290 278    Discharge Instructions    Call MD for:    Complete by:  As directed    New increase in shortness of breath, sudden chest pain on the right.  Report to the ER immediately if you have new concerns.   Call MD  for:  difficulty breathing, headache or visual disturbances    Complete by:  As directed    Call MD for:  extreme fatigue    Complete by:  As directed    Call MD for:  hives    Complete by:  As directed    Call MD for:  persistant dizziness or light-headedness    Complete by:  As directed    Call MD for:  persistant nausea and vomiting    Complete by:  As directed    Call MD for:  severe uncontrolled pain    Complete by:  As directed    Call MD for:  temperature >100.4    Complete by:  As directed    Diet - low sodium heart healthy    Complete by:  As directed    Increase activity slowly    Complete by:  As directed        [facility] Follow up.   Why:  home oxygen Contact information: [place] [address] [phone]        Melvenia Needles, NP Follow up on 05/27/2017.   Specialty:  Pulmonary Disease Why:  Appt at 10:30.  Arrive at 10:15 for Chest XRAY.  Contact information: [address] [place] [address] [phone]            Allergies as of 05/23/2017   No Known Allergies     Medication List    TAKE these medications   albuterol 108 (90 Base) MCG/ACT inhaler Commonly known as:  PROVENTIL HFA;VENTOLIN HFA Inhale 1 puff into the lungs 2 (two) times daily.   ASPIRIN 81 81 MG chewable tablet Generic drug:  aspirin Chew 81 mg by mouth daily.   dextromethorphan 30 MG/5ML liquid Commonly known as:  DELSYM Take 5 mLs (30 mg total) by mouth 2 (two) times daily.   Fluorouracil 4 % Crea Apply 5 L topically.   fluticasone 27.5 MCG/SPRAY nasal spray Commonly known as:  VERAMYST Place 1 spray into the nose daily.   Fluticasone-Salmeterol 113-14 MCG/ACT Aepb Commonly known as:  AIRDUO RESPICLICK 678/93 Inhale 1 puff into the lungs 2 (two) times daily.   levothyroxine 112 MCG tablet Commonly known as:  SYNTHROID, LEVOTHROID Take 112 mcg by mouth daily before breakfast.    montelukast 10 MG tablet Commonly known as:  SINGULAIR TAKE ONE TABLET BY MOUTH AT BEDTIME   oxybutynin 5  MG tablet Commonly known as:  DITROPAN Take 5 mg by mouth every 8 (eight) hours as needed for bladder spasms.   simvastatin 20 MG tablet Commonly known as:  ZOCOR Take 10 mg by mouth daily.            Durable Medical Equipment        Start     Ordered   05/21/17 1128  For home use only DME oxygen  Once    Comments:  Recent order for O2 from the office, please ensure he gets a portable concentrator  Question Answer Comment  Mode or (Route) Nasal cannula   Frequency Continuous (stationary and portable oxygen unit needed)   Oxygen delivery system Gas      05/21/17 1128        Disposition: Home.  No new home health needs identified at time of discharge.  O2 previously ordered from Pulmonary office - should be delivered to patients home today.    Discharged Condition: Vincent Robinson has met maximum benefit of inpatient care and is medically stable and cleared for discharge.  Patient is pending follow up as above.      Time spent on disposition:  35 Minutes.   Signed: Noe Gens, NP-C [facility] Pgr: [phone] Office: [phone]

## 2017-05-24 ENCOUNTER — Telehealth: Payer: Self-pay | Admitting: Pulmonary Disease

## 2017-05-24 DIAGNOSIS — J449 Chronic obstructive pulmonary disease, unspecified: Secondary | ICD-10-CM

## 2017-05-24 NOTE — Telephone Encounter (Signed)
Called and spoke with pt and he stated that he received the concentrator from [facility] but he stated that this was not going to work for him.  He stated that he cannot have the long line going through his house.  He is wanting to get a smaller tank but wanted to know if he need continuous or pulsed.  PM please advise. Thanks

## 2017-05-24 NOTE — Telephone Encounter (Signed)
Patient request to speak with nurse regarding conversation earlier today. Patient want to know if provider received prescription for machine.

## 2017-05-27 ENCOUNTER — Emergency Department (HOSPITAL_COMMUNITY): Payer: Medicare Other

## 2017-05-27 ENCOUNTER — Emergency Department (HOSPITAL_COMMUNITY)
Admission: EM | Admit: 2017-05-27 | Discharge: 2017-05-27 | Disposition: A | Discharge status: Home / Self Care | Payer: Medicare Other | Attending: Emergency Medicine | Admitting: Emergency Medicine

## 2017-05-27 ENCOUNTER — Inpatient Hospital Stay: Payer: Medicare Other | Admitting: Adult Health

## 2017-05-27 ENCOUNTER — Encounter (HOSPITAL_COMMUNITY): Payer: Self-pay | Admitting: Emergency Medicine

## 2017-05-27 DIAGNOSIS — Z87891 Personal history of nicotine dependence: Secondary | ICD-10-CM | POA: Insufficient documentation

## 2017-05-27 DIAGNOSIS — Z7982 Long term (current) use of aspirin: Secondary | ICD-10-CM | POA: Insufficient documentation

## 2017-05-27 DIAGNOSIS — J9383 Other pneumothorax: Secondary | ICD-10-CM

## 2017-05-27 DIAGNOSIS — Z79899 Other long term (current) drug therapy: Secondary | ICD-10-CM | POA: Diagnosis not present

## 2017-05-27 DIAGNOSIS — J449 Chronic obstructive pulmonary disease, unspecified: Secondary | ICD-10-CM | POA: Insufficient documentation

## 2017-05-27 NOTE — ED Notes (Signed)
Spoke with [facility] and scheduled appointment for patient Tuesday 8/7 at 2 pm. Patient and wife verbalized understanding.

## 2017-05-27 NOTE — ED Provider Notes (Signed)
[facility] DEPT Provider Note   CSN: [id number] Arrival date & time: 05/27/17  1004     History   Chief Complaint No chief complaint on file.   HPI Ardon Franklin is a 83 y.o. male.  Patient has a small pneumothorax. He was supposed to get a chest x-ray at his pulmonologist today but there are went out. He came to this hospital via chest x-ray but also want to be evaluated.   The history is provided by the patient. No language interpreter was used.  Shortness of Breath  This is a chronic problem. The problem occurs continuously.The current episode started more than 1 week ago. The problem has been rapidly improving. Associated symptoms include wheezing. Pertinent negatives include no fever, no headaches, no cough, no chest pain, no abdominal pain and no rash. Precipitated by: Unknown. He has tried nothing for the symptoms. The treatment provided mild relief. He has had prior hospitalizations. He has had prior ED visits. He has had prior ICU admissions. Associated medical issues include COPD.    Past Medical History:  Diagnosis Date  . Asthma   . COPD (chronic obstructive pulmonary disease) ([place])   . Skin cancer 2017  . Sleep apnea     Patient Active Problem List   Diagnosis Date Noted  . Pneumothorax on right 05/20/2017  . COPD (chronic obstructive pulmonary disease) ([place]) 05/20/2017  . Pneumothorax 05/20/2017  . Skin cancer 10/23/2015    Past Surgical History:  Procedure Laterality Date  . bleeding ulcer  2016  . BYPASS AXILLA/BRACHIAL ARTERY  1998  . colon bleed  2012  . DEBRIDMENT OF DECUBITUS ULCER  2002  . THORACIC AORTA STENT  2009       Home Medications    Prior to Admission medications   Medication Sig Start Date End Date Taking? Authorizing Provider  albuterol (PROVENTIL HFA;VENTOLIN HFA) 108 (90 Base) MCG/ACT inhaler Inhale 1 puff into the lungs 2 (two) times daily. 05/20/17  Yes Mannam, Praveen, MD  aspirin (ASPIRIN 81) 81 MG chewable tablet Chew 81  mg by mouth daily.   Yes [provider]  dextromethorphan (DELSYM) 30 MG/5ML liquid Take 5 mLs (30 mg total) by mouth 2 (two) times daily. 05/23/17  Yes Ollis, Brandi L, NP  fluticasone (VERAMYST) 27.5 MCG/SPRAY nasal spray Place 1 spray into the nose daily.   Yes [provider]  Fluticasone-Salmeterol (AIRDUO RESPICLICK 865/78) 469-62 MCG/ACT AEPB Inhale 1 puff into the lungs 2 (two) times daily. 03/05/17  Yes Mannam, Praveen, MD  levothyroxine (SYNTHROID, LEVOTHROID) 112 MCG tablet Take 112 mcg by mouth daily before breakfast.   Yes [provider]  montelukast (SINGULAIR) 10 MG tablet TAKE ONE TABLET BY MOUTH AT BEDTIME 04/22/17  Yes Mannam, Praveen, MD  oxybutynin (DITROPAN) 5 MG tablet Take 5 mg by mouth every 8 (eight) hours as needed for bladder spasms.   Yes [provider]  simvastatin (ZOCOR) 20 MG tablet Take 10 mg by mouth at bedtime. 03/22/17  Yes [provider]    Family History History reviewed. No pertinent family history.  Social History Social History  Substance Use Topics  . Smoking status: Former Smoker    Packs/day: 0.50    Quit date: 08/06/1961  . Smokeless tobacco: Never Used  . Alcohol use Not on file     Allergies   Patient has no known allergies.   Review of Systems Review of Systems  Constitutional: Negative for appetite change, fatigue and fever.  HENT: Negative for congestion,  ear discharge and sinus pressure.   Eyes: Negative for discharge.  Respiratory: Positive for shortness of breath and wheezing. Negative for cough.   Cardiovascular: Negative for chest pain.  Gastrointestinal: Negative for abdominal pain and diarrhea.  Genitourinary: Negative for frequency and hematuria.  Musculoskeletal: Negative for back pain.  Skin: Negative for rash.  Neurological: Negative for seizures and headaches.  Psychiatric/Behavioral: Negative for hallucinations.     Physical Exam Updated Vital Signs BP 123/84   Pulse  64   Temp (!) 97.5 F (36.4 C) (Oral)   Resp 18   Ht 5\' 7"  (1.702 m)   Wt 83.5 kg (184 lb)   SpO2 94%   BMI 28.82 kg/m   Physical Exam  Constitutional: He is oriented to person, place, and time. He appears well-developed.  HENT:  Head: Normocephalic.  Eyes: Conjunctivae and EOM are normal. No scleral icterus.  Neck: Neck supple. No thyromegaly present.  Cardiovascular: Normal rate and regular rhythm.  Exam reveals no gallop and no friction rub.   No murmur heard. Pulmonary/Chest: No stridor. He has wheezes. He has no rales. He exhibits no tenderness.  Abdominal: He exhibits no distension. There is no tenderness. There is no rebound.  Musculoskeletal: Normal range of motion. He exhibits no edema.  Lymphadenopathy:    He has no cervical adenopathy.  Neurological: He is oriented to person, place, and time. He exhibits normal muscle tone. Coordination normal.  Skin: No rash noted. No erythema.  Psychiatric: He has a normal mood and affect. His behavior is normal.     ED Treatments / Results  Labs (all labs ordered are listed, but only abnormal results are displayed) Labs Reviewed - No data to display  EKG  EKG Interpretation None       Radiology Dg Chest 2 View  Result Date: 05/27/2017 CLINICAL DATA:  Shortness of breath, pneumothorax, followup, history COPD EXAM: CHEST  2 VIEW COMPARISON:  05/23/2017 FINDINGS: Enlargement of cardiac silhouette post CABG. Mediastinal contours and pulmonary vascularity normal. Emphysematous and bronchitic changes consistent with COPD. Mild bibasilar atelectasis. Remaining lungs clear. Persistent small RIGHT apex pneumothorax, slightly decreased. Diffuse osseous demineralization. IMPRESSION: COPD changes with persistent small RIGHT apex pneumothorax, slightly decreased. Aortic Atherosclerosis (ICD10-I70.0) and Emphysema (ICD10-J43.9). Electronically Signed   By: Lavonia Dana M.D.   On: 05/27/2017 12:07    Procedures Procedures (including  critical care time)  Medications Ordered in ED Medications - No data to display   Initial Impression / Assessment and Plan / ED Course  I have reviewed the triage vital signs and the nursing notes.  Pertinent labs & imaging results that were available during my care of the patient were reviewed by me and considered in my medical decision making (see chart for details).     Chest x-ray shows small pneumothorax which seems to be improving. He is to follow-up with pulmonary this week  Final Clinical Impressions(s) / ED Diagnoses   Final diagnoses:  Other pneumothorax    New Prescriptions New Prescriptions   No medications on file     Milton Ferguson, MD 05/27/17 1413

## 2017-05-27 NOTE — ED Notes (Signed)
Bed: WA05 Expected date:  Expected time:  Means of arrival:  Comments: TR 2 

## 2017-05-27 NOTE — ED Notes (Signed)
Patient transported to X-ray 

## 2017-05-27 NOTE — ED Triage Notes (Signed)
Pt with hx of COPD and collapsed lung sent to ED from [facility] for chest x-ray for "torn right lung" and "collapsed lung." [facility] sent pt to ED due to their x-ray machine being. Pt wears 2 L/min O2 at home starting 2 weeks ago. Crackles to bilateral lung bases. Cyanosis to lips and fingers.

## 2017-05-27 NOTE — Discharge Instructions (Signed)
Follow up with your pulmonologist this week.  Call tomorrow for an appointment

## 2017-05-27 NOTE — Telephone Encounter (Signed)
PM please advise. Thanks  

## 2017-05-27 NOTE — ED Notes (Signed)
MD at bedside. 

## 2017-05-28 ENCOUNTER — Encounter: Payer: Self-pay | Admitting: Pulmonary Disease

## 2017-05-28 ENCOUNTER — Ambulatory Visit (INDEPENDENT_AMBULATORY_CARE_PROVIDER_SITE_OTHER): Payer: Medicare Other | Admitting: Pulmonary Disease

## 2017-05-28 ENCOUNTER — Telehealth: Payer: Self-pay | Admitting: Pulmonary Disease

## 2017-05-28 DIAGNOSIS — J849 Interstitial pulmonary disease, unspecified: Secondary | ICD-10-CM | POA: Diagnosis not present

## 2017-05-28 DIAGNOSIS — J939 Pneumothorax, unspecified: Secondary | ICD-10-CM | POA: Diagnosis not present

## 2017-05-28 DIAGNOSIS — J449 Chronic obstructive pulmonary disease, unspecified: Secondary | ICD-10-CM

## 2017-05-28 MED ORDER — MONTELUKAST SODIUM 10 MG PO TABS: 10.0000 mg | ORAL_TABLET | Freq: Every day | ORAL | 4 refills | Status: DC

## 2017-05-28 NOTE — Telephone Encounter (Signed)
Patient was seen today by Dr. Elsworth Soho for a hospital follow up. Per Dr. Elsworth Soho, he wants the patient to follow up with Dr. Vaughan Browner in 2 weeks with a CXR on return.    Dr. Vaughan Browner, you are completely booked until November. Is it ok for [place] to double-book him somewhere? Please advise. Thanks!

## 2017-05-28 NOTE — Assessment & Plan Note (Signed)
CT is not typical for IPF ? Postinflammatory scarring

## 2017-05-28 NOTE — Assessment & Plan Note (Signed)
x-ray shows that pneumothorax is decreasing in size, about 10% remains  Prior to use oxygen as much as possible we will try to get you portable concentrator

## 2017-05-28 NOTE — Patient Instructions (Signed)
Your x-ray shows that pneumothorax is decreasing in size, about 10% remains  Prior to use oxygen as much as possible we will try to get you portable concentrator Refills on Singulair

## 2017-05-28 NOTE — Assessment & Plan Note (Signed)
Refills on Singulair  Ct advair

## 2017-05-28 NOTE — Progress Notes (Signed)
   Subjective:    Patient ID: Vincent Robinson, male    DOB: 1923/05/02, 83 y.o.   MRN: [id number]  HPI  83 y/o M, originally from [place], [employer] veteran admitted on 7/30 with a spontaneous right pneumothorax.  He carries a medical history of thoracic aneurysm, CABGx5, COPD and OSA on BiPAP.  He was discharged on 8/2 at home oxygen was delivered, he is able to use it 90% of the time, has a blended into CPAP during sleep  His cough has disappeared. Denies dyspnea. Has questions about his activity level He states that he was told about scarring in his lung in 1954 His oxygen level has been running low since the 80s  Significant tests/ events reviewed  CT chest evaluation demonstrated right sided pneumothorax, approx 20%, stable underlying chronic interstitial lung disease with probable early fibrosis, mild lower lobe bronchiectasis, stable posterior RUL scarring, aneurysmal disease of the ascending thoracic aorta (4.6 cm).    Past Medical History:  Diagnosis Date  . Asthma   . COPD (chronic obstructive pulmonary disease) ([place])   . Skin cancer 2017  . Sleep apnea      Review of Systems neg for any significant sore throat, dysphagia, itching, sneezing, nasal congestion or excess/ purulent secretions, fever, chills, sweats, unintended wt loss, pleuritic or exertional cp, hempoptysis, orthopnea pnd or change in chronic leg swelling. Also denies presyncope, palpitations, heartburn, abdominal pain, nausea, vomiting, diarrhea or change in bowel or urinary habits, dysuria,hematuria, rash, arthralgias, visual complaints, headache, numbness weakness or ataxia.     Objective:   Physical Exam   Gen. Pleasant, well-nourished, in no distress ENT - no thrush, no post nasal drip Neck: No JVD, no thyromegaly, no carotid bruits Lungs: no use of accessory muscles, no dullness to percussion, clear without rales or rhonchi  Cardiovascular: Rhythm regular, heart sounds  normal, no murmurs or gallops, no  peripheral edema Musculoskeletal: No deformities, no cyanosis or clubbing         Assessment & Plan:

## 2017-05-28 NOTE — Telephone Encounter (Signed)
Ok to order smaller tanks with pulsed O2  Marshell Garfinkel MD [facility] 05/28/2017, 10:50 AM

## 2017-05-28 NOTE — Telephone Encounter (Signed)
Order placed for POC/smaller tanks with [employer] per patient request. Nothing further needed.

## 2017-05-30 ENCOUNTER — Telehealth: Payer: Self-pay | Admitting: Pulmonary Disease

## 2017-05-30 NOTE — Telephone Encounter (Signed)
OK to double book him or put in a held slot. Check with [name].  Marshell Garfinkel MD [facility] Pager [phone] If no answer or after 3pm call: [phone] 05/30/2017, 3:30 PM

## 2017-05-30 NOTE — Telephone Encounter (Signed)
I have located the form and have placed it in PM look at cubby.  PM will not be back in the office until Monday.  I have called Hinton Dyer with [employer] and left a VM to make him aware that PM in the office on Monday and will get the forms completed and faxed back then. Will forward to [facility] to hold message and follow up on paper work Monday.

## 2017-05-30 NOTE — Telephone Encounter (Signed)
Pt has been scheduled with PM on 8/14 at 4 and will come early to have cxr done prior to his appt. I have called the pt and he is aware of appt and cxr.

## 2017-05-31 NOTE — Telephone Encounter (Signed)
[name] ANOTHER detailed msg explaining that we have the form and will complete and send back to him once PM is able to sign- back in the office again on 06/03/17.  [name]- please advise once done and we will close out the msg, thanks

## 2017-05-31 NOTE — Telephone Encounter (Signed)
Spoke with pt, advised that order will be faxed back to [employer] on Monday after PM returns to clinic to sign order.  Pt expressed understanding.   Routing back to Ethan for follow-up.

## 2017-05-31 NOTE — Telephone Encounter (Signed)
Pt is calling to check on status of oxygen order

## 2017-05-31 NOTE — Telephone Encounter (Signed)
Hinton Dyer from [facility] calling to verify that we received fax for order on oxygen  Hinton Dyer # [phone]

## 2017-06-04 ENCOUNTER — Ambulatory Visit (INDEPENDENT_AMBULATORY_CARE_PROVIDER_SITE_OTHER): Payer: Medicare Other | Admitting: Pulmonary Disease

## 2017-06-04 ENCOUNTER — Encounter: Payer: Self-pay | Admitting: Pulmonary Disease

## 2017-06-04 ENCOUNTER — Ambulatory Visit (INDEPENDENT_AMBULATORY_CARE_PROVIDER_SITE_OTHER)
Admission: RE | Admit: 2017-06-04 | Discharge: 2017-06-04 | Disposition: A | Discharge status: Home / Self Care | Payer: Medicare Other | Source: Ambulatory Visit | Attending: Pulmonary Disease | Admitting: Pulmonary Disease

## 2017-06-04 VITALS — BP 122/72 | HR 87 | Ht 67.0 in | Wt 191.6 lb

## 2017-06-04 DIAGNOSIS — G4733 Obstructive sleep apnea (adult) (pediatric): Secondary | ICD-10-CM | POA: Diagnosis not present

## 2017-06-04 DIAGNOSIS — J449 Chronic obstructive pulmonary disease, unspecified: Secondary | ICD-10-CM | POA: Diagnosis not present

## 2017-06-04 DIAGNOSIS — J8410 Pulmonary fibrosis, unspecified: Secondary | ICD-10-CM

## 2017-06-04 DIAGNOSIS — J939 Pneumothorax, unspecified: Secondary | ICD-10-CM

## 2017-06-04 NOTE — Telephone Encounter (Signed)
Pt in office for OV with PM. Pt states he does not wish to proceed with [employer] order at this time, due to having to pay out of pocket. I have spoken with Hinton Dyer with [employer], and made him aware of this.

## 2017-06-04 NOTE — Telephone Encounter (Signed)
LM x 1 for [employer]

## 2017-06-04 NOTE — Telephone Encounter (Signed)
Hinton Dyer with [employer] stated he has not received order for oxygen. [phone]

## 2017-06-04 NOTE — Telephone Encounter (Signed)
Vincent Robinson has this form been faxed back?  May we close this message?   Thanks

## 2017-06-04 NOTE — Progress Notes (Signed)
Vincent Robinson    [id number]    24-Nov-1922  Primary Care Physician:Briscoe, Jannifer Rodney, MD  Referring Physician: Katherina Mires, MD [place] [address] [place], [place] [address]  Chief complaint:   Follow up for COPD Mild ILD, post inflammatory scarring Spontaneous pneumothorax  HPI: [name] is a delightful 83 year old with past history of COPD, hypersomnia with sleep apnea, allergic rhinitis, cough variant asthma. He recently moved from [place] to be close to his family and wants to establish care here. He is chief complaints of dyspnea on exertion. He has noticed increasing cough over the past month with white sputum production. He denies any fevers, chills. He saw his primary care doctor Dr. Doreene Nest who started him on Symbicort. He is also due to get a high-resolution CT of the chest to evaluate interstitial abnormalities.  He has history of sleep apnea on BiPAP at settings of 20/6. This is report of CT of the chest from [place] which shows calcified nodule, chronic scarring. 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 He moved to [place] in 1956. Apparently his immigration was delayed due to a lung abnormality on imaging. He was eventually cleared. He does not have any history of TB, exposure to TB. He's had an episode of influenza while in [place].  Interim history: He is hospitalized last month after he developed a spontaneous pneumothorax. He was observed in the hospital for several days with resolution of the pneumothorax. He did not need a chest tube. He is now on supplemental oxygen He returns to the clinic for a follow-up visit. Feels well with improvement in dyspnea.  Outpatient Encounter Prescriptions as of 06/04/2017  Medication Sig  . albuterol (PROVENTIL HFA;VENTOLIN HFA) 108 (90 Base) MCG/ACT inhaler Inhale 1 puff into the lungs  2 (two) times daily.  [place] Kitchen aspirin (ASPIRIN 81) 81 MG chewable tablet Chew 81 mg by mouth daily.  [place] Kitchen dextromethorphan (DELSYM) 30 MG/5ML liquid Take 5 mLs (30 mg total) by mouth 2 (two) times daily.  . fluticasone (VERAMYST) 27.5 MCG/SPRAY nasal spray Place 1 spray into the nose daily.  . Fluticasone-Salmeterol (AIRDUO RESPICLICK 660/63) 016-01 MCG/ACT AEPB Inhale 1 puff into the lungs 2 (two) times daily.  [place] Kitchen levothyroxine (SYNTHROID, LEVOTHROID) 112 MCG tablet Take 112 mcg by mouth daily before breakfast.  . montelukast (SINGULAIR) 10 MG tablet Take 1 tablet (10 mg total) by mouth at bedtime.  [place] Kitchen oxybutynin (DITROPAN) 5 MG tablet Take 5 mg by mouth every 8 (eight) hours as needed for bladder spasms.  . simvastatin (ZOCOR) 20 MG tablet Take 10 mg by mouth at bedtime.   No facility-administered encounter medications on file as of 06/04/2017.     Allergies as of 06/04/2017  . (No Known Allergies)    Past Medical History:  Diagnosis Date  . Asthma   . COPD (chronic obstructive pulmonary disease) ([place])   . Skin cancer 2017  . Sleep apnea     Past Surgical History:  Procedure Laterality Date  . bleeding ulcer  2016  . BYPASS AXILLA/BRACHIAL ARTERY  1998  . colon bleed  2012  . DEBRIDMENT OF DECUBITUS ULCER  2002  . THORACIC AORTA STENT  2009    No family history on file.  Social History   Social History  . Marital status: Married  Spouse name: N/A  . Number of children: N/A  . Years of education: N/A   Occupational History  . Not on file.   Social History Main Topics  . Smoking status: Former Smoker    Packs/day: 0.50    Quit date: 08/06/1961  . Smokeless tobacco: Never Used  . Alcohol use Not on file  . Drug use: Unknown  . Sexual activity: Not on file   Other Topics Concern  . Not on file   Social History Narrative  . No narrative on file    Review of systems: Review of Systems  Constitutional: Negative for fever and chills.  HENT: Negative.   Eyes:  Negative for blurred vision.  Respiratory: as per HPI  Cardiovascular: Negative for chest pain and palpitations.  Gastrointestinal: Negative for vomiting, diarrhea, blood per rectum. Genitourinary: Negative for dysuria, urgency, frequency and hematuria.  Musculoskeletal: Negative for myalgias, back pain and joint pain.  Skin: Negative for itching and rash.  Neurological: Negative for dizziness, tremors, focal weakness, seizures and loss of consciousness.  Endo/Heme/Allergies: Negative for environmental allergies.  Psychiatric/Behavioral: Negative for depression, suicidal ideas and hallucinations.  All other systems reviewed and are negative.  Physical Exam: Blood pressure 122/82, pulse 71, height 5\' 8"  (1.727 m), weight 195 lb (88.5 kg), SpO2 90 %. Gen:      No acute distress HEENT:  EOMI, sclera anicteric Neck:     No masses; no thyromegaly Lungs:    Basal crackles; normal respiratory effort CV:         Regular rate and rhythm; no murmurs Abd:      + bowel sounds; soft, non-tender; no palpable masses, no distension Ext:    No edema; adequate peripheral perfusion Skin:      Warm and dry; no rash Neuro: alert and oriented x 3 Psych: normal mood and affect  Data Reviewed: PFTs 02/28/16 FVC 3.37 (116%) FEV1 2.08 (808%) F/F 62 Mild obstructive airway disease, no broncho-dilator response  CT scan 07/15/15- 2 .3 x 1.7 cm right upper lobe calcified opacity likely to represent pleural , parenchymal scarring. Mild centrilobular, paraseptal emphysema. Subpleural reticulation, fibrosis representing mild chronic ILD High-resolution CT 09/04/16 - stable right upper lobe scarring, hyper aerated lungs. Minimal interstitial lung disease and peripheral unchanged compared to 2016. Chest x-ray 05/20/17 - moderate right apical pneumothorax Chest x-ray 06/04/17- resolution of right apical pneumothorax. Chronic interstitial opacities I have reviewed all images personally.  BiPAP download December  2017-January 2018 Time spent greater than 4 hours- 97% AHI 7.2  Assessment:  #1 Spontaneous pneumothorax Resolved on today's chest x ray  #2 COPD, asthmatic bronchitis. PFTs reviewed which show mild obstruction and he has emphysema on prior imaging. He continues on airduo and albuterol inhalers. He'll continue on the same. He is now on supplemental oxygen We will re assess his O2 levels   #3 Mild ILD, postinflammatory lung fibrosis Previous CTs have apparently shown a calcified RUL scarring and mild ILD. The repeat high res resolution CT shows stability of findings from 2016 - 2017. I believe this may be from prior infections and exposures during his time in [place] and during world war. We will continue to follow this. I do not believe he'll need aggressive workup such as biopsy as the findings are stable and given his advanced stage.    #4 OSA He continues on BiPAP with good compliance. .  Plan/Recommendations: - Continue airduo - Prescribe supplemental O2 - Continue mucinex DM - Continue Bipap.  Bertha Lokken  MD [facility] Pager [phone] 06/04/2017, 4:42 PM  CC: Katherina Mires, MD

## 2017-06-04 NOTE — Patient Instructions (Signed)
Continue inhalers and supplemental oxygen Return to clinic in 6 months.

## 2017-06-05 ENCOUNTER — Inpatient Hospital Stay (HOSPITAL_COMMUNITY)
Admission: EM | Admit: 2017-06-05 | Discharge: 2017-06-14 | DRG: 377 | Disposition: A | Discharge status: Home / Self Care | Payer: Medicare Other | Attending: Internal Medicine | Admitting: Internal Medicine

## 2017-06-05 ENCOUNTER — Emergency Department (HOSPITAL_COMMUNITY): Payer: Medicare Other

## 2017-06-05 ENCOUNTER — Encounter (HOSPITAL_COMMUNITY): Payer: Self-pay | Admitting: Nurse Practitioner

## 2017-06-05 DIAGNOSIS — N3281 Overactive bladder: Secondary | ICD-10-CM | POA: Diagnosis present

## 2017-06-05 DIAGNOSIS — Z6831 Body mass index (BMI) 31.0-31.9, adult: Secondary | ICD-10-CM

## 2017-06-05 DIAGNOSIS — E669 Obesity, unspecified: Secondary | ICD-10-CM | POA: Diagnosis present

## 2017-06-05 DIAGNOSIS — R578 Other shock: Secondary | ICD-10-CM | POA: Diagnosis present

## 2017-06-05 DIAGNOSIS — Z7982 Long term (current) use of aspirin: Secondary | ICD-10-CM

## 2017-06-05 DIAGNOSIS — Z823 Family history of stroke: Secondary | ICD-10-CM | POA: Diagnosis not present

## 2017-06-05 DIAGNOSIS — Z7951 Long term (current) use of inhaled steroids: Secondary | ICD-10-CM

## 2017-06-05 DIAGNOSIS — Z85828 Personal history of other malignant neoplasm of skin: Secondary | ICD-10-CM

## 2017-06-05 DIAGNOSIS — K922 Gastrointestinal hemorrhage, unspecified: Secondary | ICD-10-CM | POA: Diagnosis not present

## 2017-06-05 DIAGNOSIS — I110 Hypertensive heart disease with heart failure: Secondary | ICD-10-CM | POA: Diagnosis present

## 2017-06-05 DIAGNOSIS — E861 Hypovolemia: Secondary | ICD-10-CM | POA: Diagnosis present

## 2017-06-05 DIAGNOSIS — J9601 Acute respiratory failure with hypoxia: Secondary | ICD-10-CM | POA: Diagnosis present

## 2017-06-05 DIAGNOSIS — C4490 Unspecified malignant neoplasm of skin, unspecified: Secondary | ICD-10-CM | POA: Diagnosis present

## 2017-06-05 DIAGNOSIS — E785 Hyperlipidemia, unspecified: Secondary | ICD-10-CM | POA: Diagnosis present

## 2017-06-05 DIAGNOSIS — R0600 Dyspnea, unspecified: Secondary | ICD-10-CM

## 2017-06-05 DIAGNOSIS — D62 Acute posthemorrhagic anemia: Secondary | ICD-10-CM | POA: Diagnosis present

## 2017-06-05 DIAGNOSIS — Z825 Family history of asthma and other chronic lower respiratory diseases: Secondary | ICD-10-CM | POA: Diagnosis not present

## 2017-06-05 DIAGNOSIS — K5731 Diverticulosis of large intestine without perforation or abscess with bleeding: Secondary | ICD-10-CM | POA: Diagnosis present

## 2017-06-05 DIAGNOSIS — Z79899 Other long term (current) drug therapy: Secondary | ICD-10-CM

## 2017-06-05 DIAGNOSIS — Z87891 Personal history of nicotine dependence: Secondary | ICD-10-CM

## 2017-06-05 DIAGNOSIS — E039 Hypothyroidism, unspecified: Secondary | ICD-10-CM | POA: Diagnosis present

## 2017-06-05 DIAGNOSIS — E876 Hypokalemia: Secondary | ICD-10-CM | POA: Diagnosis present

## 2017-06-05 DIAGNOSIS — J449 Chronic obstructive pulmonary disease, unspecified: Secondary | ICD-10-CM | POA: Diagnosis present

## 2017-06-05 DIAGNOSIS — I5033 Acute on chronic diastolic (congestive) heart failure: Secondary | ICD-10-CM | POA: Diagnosis present

## 2017-06-05 DIAGNOSIS — G4733 Obstructive sleep apnea (adult) (pediatric): Secondary | ICD-10-CM | POA: Diagnosis present

## 2017-06-05 DIAGNOSIS — R011 Cardiac murmur, unspecified: Secondary | ICD-10-CM | POA: Diagnosis present

## 2017-06-05 DIAGNOSIS — I951 Orthostatic hypotension: Secondary | ICD-10-CM | POA: Diagnosis present

## 2017-06-05 DIAGNOSIS — G4730 Sleep apnea, unspecified: Secondary | ICD-10-CM | POA: Diagnosis present

## 2017-06-05 DIAGNOSIS — D72829 Elevated white blood cell count, unspecified: Secondary | ICD-10-CM | POA: Diagnosis present

## 2017-06-05 DIAGNOSIS — J45909 Unspecified asthma, uncomplicated: Secondary | ICD-10-CM | POA: Diagnosis present

## 2017-06-05 DIAGNOSIS — J8410 Pulmonary fibrosis, unspecified: Secondary | ICD-10-CM | POA: Diagnosis present

## 2017-06-05 HISTORY — DX: Hypothyroidism, unspecified: E03.9

## 2017-06-05 HISTORY — DX: Hyperlipidemia, unspecified: E78.5

## 2017-06-05 LAB — COMPREHENSIVE METABOLIC PANEL
ALBUMIN: 3 g/dL — AB (ref 3.5–5.0)
ALT: 26 U/L (ref 17–63)
AST: 25 U/L (ref 15–41)
Alkaline Phosphatase: 52 U/L (ref 38–126)
Anion gap: 6 (ref 5–15)
BUN: 22 mg/dL — AB (ref 6–20)
CHLORIDE: 114 mmol/L — AB (ref 101–111)
CO2: 23 mmol/L (ref 22–32)
Calcium: 7.6 mg/dL — ABNORMAL LOW (ref 8.9–10.3)
Creatinine, Ser: 0.96 mg/dL (ref 0.61–1.24)
GFR calc Af Amer: >60 mL/min (ref >60)
GFR calc non Af Amer: >60 mL/min (ref >60)
GLUCOSE: 98 mg/dL (ref 65–99)
Potassium: 4 mmol/L (ref 3.5–5.1)
Sodium: 143 mmol/L (ref 135–145)
Total Bilirubin: 0.9 mg/dL (ref 0.3–1.2)
Total Protein: 5.7 g/dL — ABNORMAL LOW (ref 6.5–8.1)

## 2017-06-05 LAB — CBC
HEMATOCRIT: 29.3 % — AB (ref 39.0–52.0)
HEMATOCRIT: 27.9 % — AB (ref 39.0–52.0)
HEMOGLOBIN: 9.4 g/dL — AB (ref 13.0–17.0)
Hemoglobin: 10.3 g/dL — ABNORMAL LOW (ref 13.0–17.0)
MCH: 34.4 pg — ABNORMAL HIGH (ref 26.0–34.0)
MCH: 32.5 pg (ref 26.0–34.0)
MCHC: 35.2 g/dL (ref 30.0–36.0)
MCHC: 33.7 g/dL (ref 30.0–36.0)
MCV: 98 fL (ref 78.0–100.0)
MCV: 96.5 fL (ref 78.0–100.0)
PLATELETS: 224 10*3/uL (ref 150–400)
Platelets: 225 10*3/uL (ref 150–400)
RBC: 2.99 MIL/uL — ABNORMAL LOW (ref 4.22–5.81)
RBC: 2.89 MIL/uL — AB (ref 4.22–5.81)
RDW: 14.4 % (ref 11.5–15.5)
RDW: 14.4 % (ref 11.5–15.5)
WBC: 10 10*3/uL (ref 4.0–10.5)
WBC: 9.7 10*3/uL (ref 4.0–10.5)

## 2017-06-05 LAB — I-STAT CHEM 8, ED
BUN: 22 mg/dL — ABNORMAL HIGH (ref 6–20)
CREATININE: 0.9 mg/dL (ref 0.61–1.24)
Calcium, Ion: 1.01 mmol/L — ABNORMAL LOW (ref 1.15–1.40)
Chloride: 111 mmol/L (ref 101–111)
Glucose, Bld: 89 mg/dL (ref 65–99)
HCT: 27 % — ABNORMAL LOW (ref 39.0–52.0)
HEMOGLOBIN: 9.2 g/dL — AB (ref 13.0–17.0)
Potassium: 3.9 mmol/L (ref 3.5–5.1)
SODIUM: 144 mmol/L (ref 135–145)
TCO2: 23 mmol/L (ref 0–100)

## 2017-06-05 LAB — PROTIME-INR
INR: 1.12
Prothrombin Time: 14.5 seconds (ref 11.4–15.2)

## 2017-06-05 LAB — ABO/RH: ABO/RH(D): A POS

## 2017-06-05 LAB — I-STAT CG4 LACTIC ACID, ED: LACTIC ACID, VENOUS: 1.38 mmol/L (ref 0.5–1.9)

## 2017-06-05 MED ORDER — ALBUTEROL SULFATE (2.5 MG/3ML) 0.083% IN NEBU: 2.5000 mg | INHALATION_SOLUTION | RESPIRATORY_TRACT | Status: DC | PRN

## 2017-06-05 MED ORDER — PANTOPRAZOLE SODIUM 40 MG IV SOLR
40.0000 mg | Freq: Every day | INTRAVENOUS | Status: DC
  Administered 2017-06-06 (×2): 40 mg via INTRAVENOUS
  Filled 2017-06-05 (×2): qty 40

## 2017-06-05 MED ORDER — SODIUM CHLORIDE 0.9 % IV SOLN
INTRAVENOUS | Status: DC
  Administered 2017-06-06 – 2017-06-09 (×5): via INTRAVENOUS

## 2017-06-05 MED ORDER — SIMVASTATIN 10 MG PO TABS
10.0000 mg | ORAL_TABLET | Freq: Every day | ORAL | Status: DC
  Administered 2017-06-06 – 2017-06-13 (×9): 10 mg via ORAL
  Filled 2017-06-05 (×9): qty 1

## 2017-06-05 MED ORDER — IOPAMIDOL (ISOVUE-300) INJECTION 61%
INTRAVENOUS | Status: AC
  Administered 2017-06-05: 100 mL
  Filled 2017-06-05: qty 100

## 2017-06-05 MED ORDER — FLUTICASONE PROPIONATE 50 MCG/ACT NA SUSP
1.0000 | Freq: Every day | NASAL | Status: DC
  Administered 2017-06-06 – 2017-06-14 (×4): 1 via NASAL
  Filled 2017-06-05 (×2): qty 16

## 2017-06-05 MED ORDER — MOMETASONE FURO-FORMOTEROL FUM 100-5 MCG/ACT IN AERO
2.0000 | INHALATION_SPRAY | Freq: Two times a day (BID) | RESPIRATORY_TRACT | Status: DC
  Administered 2017-06-06 – 2017-06-14 (×15): 2 via RESPIRATORY_TRACT
  Filled 2017-06-05: qty 8.8

## 2017-06-05 MED ORDER — FLUTICASONE FUROATE 27.5 MCG/SPRAY NA SUSP
1.0000 | Freq: Every day | NASAL | Status: DC
Start: 2017-06-06 — End: 2017-06-05

## 2017-06-05 MED ORDER — LEVOTHYROXINE SODIUM 112 MCG PO TABS
112.0000 ug | ORAL_TABLET | Freq: Every day | ORAL | Status: DC
  Administered 2017-06-06 – 2017-06-14 (×8): 112 ug via ORAL
  Filled 2017-06-05 (×9): qty 1

## 2017-06-05 MED ORDER — OXYBUTYNIN CHLORIDE 5 MG PO TABS: 5.0000 mg | ORAL_TABLET | Freq: Three times a day (TID) | ORAL | Status: DC | PRN

## 2017-06-05 MED ORDER — FLUTICASONE-SALMETEROL 113-14 MCG/ACT IN AEPB: 1.0000 | INHALATION_SPRAY | Freq: Two times a day (BID) | RESPIRATORY_TRACT | Status: DC

## 2017-06-05 MED ORDER — MONTELUKAST SODIUM 10 MG PO TABS
10.0000 mg | ORAL_TABLET | Freq: Every day | ORAL | Status: DC
  Administered 2017-06-06 – 2017-06-13 (×9): 10 mg via ORAL
  Filled 2017-06-05 (×9): qty 1

## 2017-06-05 MED ORDER — DEXTROMETHORPHAN POLISTIREX ER 30 MG/5ML PO SUER
30.0000 mg | Freq: Two times a day (BID) | ORAL | Status: DC
  Administered 2017-06-06 – 2017-06-14 (×17): 30 mg via ORAL
  Filled 2017-06-05 (×18): qty 5

## 2017-06-05 NOTE — ED Triage Notes (Signed)
Patient having GI bleed bright red blood for 15 mins. EMS stated there was blood everywhere in the bathroom. Likely lost a lot of blood. BP was 88/50 HR 110. After a liter bolus bp increased. Patient feels lightheaded.

## 2017-06-05 NOTE — ED Notes (Signed)
Called to room by Clarise Cruz RN about pt having a tourniquet on left arm states was left by EMS no IV's started in ER informed Doctor Issacs of event good strong pulse and good color to left arm with some redness at site of tourniquet. No pain voiced by pt.

## 2017-06-05 NOTE — ED Notes (Signed)
Daughter [name] would like to be contacted for updates.  [phone]

## 2017-06-05 NOTE — ED Notes (Signed)
Bed: CV01 Expected date:  Expected time:  Means of arrival:  Comments: EMS G.I. Bleed

## 2017-06-05 NOTE — H&P (Signed)
History and Physical    Ariez Neilan OQH:[id number] DOB: 1923/02/05 DOA: 06/05/2017  PCP: Katherina Mires, MD   Patient coming from: Home.  I have personally briefly reviewed patient's old medical records in [place]  Chief Complaint: Rectal bleed  HPI: Vincent Robinson is a 83 y.o. male with medical history significant of asthma, COPD, hyperlipidemia, hypothyroidism, skin cancer, sleep apnea on BiPAP who was recently admitted and discharged from 05/20/2017 2 05/23/2017 due to right apical pneumothorax is now coming to the emergency department with complaints of rectal bleeding.  Per patient, he woke up uneventfully this afternoon. He went walking to see his daughter, when he felt the urge to go to the bathroom. Once he got to his daughter's house, he went to the bathroom and had a loose stool bowel movement with dark blood on it. He went back to his place, accompanied by his daughter, to change his underpants, but when he arrived there he had 2 more bloody bowel movements. The last one had some bright red blood in it. He states that it made him feel flushed and really dizzy. He takes a daily baby aspirin, but denies taking any other NSAIDs. He is not taking anticoagulants. He denies using ginger or Janco below plus supplements. He denies fever, chest pain, palpitations, dizziness, diaphoresis, pitting edema lower extremities, PND, orthopnea, abdominal pain, nausea, emesis, dysuria, frequency, hematuria, polyuria, polydipsia or blurred vision. He has recently had multiple skin cancer lesions treated.  ED Course: Initial vital signs Ms. department temperature 97.3F, pulse 80, blood pressure 122/86 mmHg, respirations 18 and O2 sat 94% on room air. WBC was 10, hemoglobin 10.3 g/dL (hemoglobin was 13.9 on 05/21/2017) and platelets 225. His sodium 144, potassium 3.9, chloride 98, bicarbonate 23 and lactic acid 1.38 mmol/L. His BUN was 22, creatinine 0.96 and glucose 114 mg/dL. His LFTs were normal, as  therefore total protein which was 5.7 g/dL. Marland Kitchen He received IV fluids and pantoprazole in the emergency department.  Imaging: His chest radiograph did not show any acute cardiopulmonary pathology, but has some chronic findings. CT abdomen/pelvis showed diverticulosis, thickening of the descending colon concerning for neoplasm and a small acute hematoma of the left rectus muscle with a calculated 30 mL volume. Please see images and full radiology report for further detail.  Review of Systems: As per HPI otherwise 10 point review of systems negative.    Past Medical History:  Diagnosis Date  . Asthma   . COPD (chronic obstructive pulmonary disease) ([place])   . Hyperlipidemia   . Hypothyroidism   . Skin cancer 2017  . Sleep apnea     Past Surgical History:  Procedure Laterality Date  . bleeding ulcer  2016  . BYPASS AXILLA/BRACHIAL ARTERY  1998  . colon bleed  2012  . DEBRIDMENT OF DECUBITUS ULCER  2002  . THORACIC AORTA STENT  2009     reports that he quit smoking about 55 years ago. He smoked 0.50 packs per day. He has never used smokeless tobacco. His alcohol and drug histories are not on file.  No Known Allergies  Family History  Problem Relation Age of Onset  . Emphysema Mother   . Stroke Father   . Movement disorder Sister   . Aneurysm Brother        Thoracic aorta  . Macular degeneration Brother     Prior to Admission medications   Medication Sig Start Date End Date Taking? Authorizing Provider  albuterol (PROVENTIL HFA;VENTOLIN HFA) 108 (90  Base) MCG/ACT inhaler Inhale 1 puff into the lungs 2 (two) times daily. 05/20/17  Yes Mannam, Praveen, MD  aspirin (ASPIRIN 81) 81 MG chewable tablet Chew 81 mg by mouth daily.   Yes [provider]  dextromethorphan (DELSYM) 30 MG/5ML liquid Take 5 mLs (30 mg total) by mouth 2 (two) times daily. 05/23/17  Yes Ollis, Brandi L, NP  fluticasone (VERAMYST) 27.5 MCG/SPRAY nasal spray Place 1 spray into the nose daily.   Yes  [provider]  Fluticasone-Salmeterol (AIRDUO RESPICLICK 166/06) 301-60 MCG/ACT AEPB Inhale 1 puff into the lungs 2 (two) times daily. 03/05/17  Yes Mannam, Praveen, MD  levothyroxine (SYNTHROID, LEVOTHROID) 112 MCG tablet Take 112 mcg by mouth daily before breakfast.   Yes [provider]  montelukast (SINGULAIR) 10 MG tablet Take 1 tablet (10 mg total) by mouth at bedtime. 05/28/17  Yes Rigoberto Noel, MD  simvastatin (ZOCOR) 20 MG tablet Take 10 mg by mouth at bedtime. 03/22/17  Yes [provider]  oxybutynin (DITROPAN) 5 MG tablet Take 5 mg by mouth every 8 (eight) hours as needed for bladder spasms.    [provider]    Physical Exam: Vitals:   06/05/17 1836 06/05/17 1837 06/05/17 2024  BP: 122/86  138/78  Pulse: 80  79  Resp: 18  20  Temp:   97.8 F (36.6 C)  TempSrc:   Oral  SpO2: 94%  94%  Weight:  86.6 kg (191 lb)   Height:  5\' 7"  (1.702 m)     Constitutional: NAD, calm, comfortable Eyes: PERRL, lids and conjunctivae normal ENMT: Mucous membranes are moist. Posterior pharynx clear of any exudate or lesions.Normal dentition.  Neck: normal, supple, no masses, no thyromegaly Respiratory: clear to auscultation bilaterally, no wheezing, no crackles. Normal respiratory effort. No accessory muscle use.  Cardiovascular: Regular rate and rhythm, no murmurs / rubs / gallops. No extremity edema. 2+ pedal pulses. No carotid bruits.  Abdomen: Soft, no tenderness, no masses palpated. No hepatosplenomegaly. Bowel sounds positive.  Musculoskeletal: no clubbing / cyanosis. Good ROM, no contractures. Normal muscle tone.  Skin: Multiple areas of a recently treated skin cancer (extremities and head). Positive actinic keratosis lesions on forearms. Positive hyperpigmented macules on the upper back Neurologic: CN 2-12 grossly intact. Sensation intact, DTR normal. Strength 5/5 in all 4.  Psychiatric: Normal judgment and insight. Alert and oriented x 4. Normal  mood.    Labs on Admission: I have personally reviewed following labs and imaging studies  CBC:  Recent Labs Lab 06/05/17 1848 06/05/17 1901  WBC 10.0  --   HGB 10.3* 9.2*  HCT 29.3* 27.0*  MCV 98.0  --   PLT 225  --    Basic Metabolic Panel:  Recent Labs Lab 06/05/17 1848 06/05/17 1901  NA 143 144  K 4.0 3.9  CL 114* 111  CO2 23  --   GLUCOSE 98 89  BUN 22* 22*  CREATININE 0.96 0.90  CALCIUM 7.6*  --    GFR: Estimated Creatinine Clearance: 56.2 mL/min (by C-G formula based on SCr of 0.9 mg/dL). Liver Function Tests:  Recent Labs Lab 06/05/17 1848  AST 25  ALT 26  ALKPHOS 52  BILITOT 0.9  PROT 5.7*  ALBUMIN 3.0*   No results for input(s): LIPASE, AMYLASE in the last 168 hours. No results for input(s): AMMONIA in the last 168 hours. Coagulation Profile:  Recent Labs Lab 06/05/17 1848  INR 1.12   Cardiac Enzymes: No results for input(s): CKTOTAL,  CKMB, CKMBINDEX, TROPONINI in the last 168 hours. BNP (last 3 results) No results for input(s): PROBNP in the last 8760 hours. HbA1C: No results for input(s): HGBA1C in the last 72 hours. CBG: No results for input(s): GLUCAP in the last 168 hours. Lipid Profile: No results for input(s): CHOL, HDL, LDLCALC, TRIG, CHOLHDL, LDLDIRECT in the last 72 hours. Thyroid Function Tests: No results for input(s): TSH, T4TOTAL, FREET4, T3FREE, THYROIDAB in the last 72 hours. Anemia Panel: No results for input(s): VITAMINB12, FOLATE, FERRITIN, TIBC, IRON, RETICCTPCT in the last 72 hours. Urine analysis: No results found for: COLORURINE, APPEARANCEUR, LABSPEC, [place], GLUCOSEU, HGBUR, BILIRUBINUR, KETONESUR, PROTEINUR, UROBILINOGEN, NITRITE, LEUKOCYTESUR  Radiological Exams on Admission: Dg Chest 2 View  Result Date: 06/04/2017 CLINICAL DATA:  Recent pneumothorax.  COPD EXAM: CHEST  2 VIEW COMPARISON:  May 27, 2017 FINDINGS: Previously noted right-sided pneumothorax is no longer evident. There is interstitial  fibrotic type change throughout the lungs bilaterally. There is no frank edema or consolidation. There is focal scarring in the bases. Heart is enlarged with pulmonary vascularity within normal limits. Patient is status post coronary artery bypass grafting. There is aortic atherosclerosis. There is degenerative change in the thoracic spine and each shoulder. No evident adenopathy. IMPRESSION: No evident pneumothorax. Underlying interstitial fibrotic change with mild bibasilar scarring. No edema or consolidation. Stable cardiac prominence. There is aortic atherosclerosis. Patient is status post coronary artery bypass grafting. Aortic Atherosclerosis (ICD10-I70.0). Electronically Signed   By: Lowella Grip III M.D.   On: 06/04/2017 19:20   Ct Abdomen Pelvis W Contrast  Result Date: 06/05/2017 CLINICAL DATA:  Rectal bleeding.  Hypotensive. EXAM: CT ABDOMEN AND PELVIS WITH CONTRAST TECHNIQUE: Multidetector CT imaging of the abdomen and pelvis was performed using the standard protocol following bolus administration of intravenous contrast. CONTRAST:  152mL ISOVUE-300 IOPAMIDOL (ISOVUE-300) INJECTION 61% COMPARISON:  CT 07/15/2015 FINDINGS: Lower chest: Lung bases are clear. Hepatobiliary: No focal hepatic lesion. No biliary duct dilatation. Gallbladder is normal. Common bile duct is normal. Pancreas: Pancreas is normal. No ductal dilatation. No pancreatic inflammation. Spleen: Normal spleen Adrenals/urinary tract: Adrenal glands and kidneys are normal. The ureters and bladder normal. Stomach/Bowel: Stomach, duodenum, and small bowel are normal. Cecum oriented ventral midline and superiorly in the abdomen. No evidence of cecal dilatation or twisting. Appendix not identified. There several diverticulum along the transverse colon descending colon. Region of asymmetry mild wall thickening at the level of the proximal descending colon seen best on coronal image 76, series 3. No obstruction related to this of focal  wall thickening which occurs over approximately 4 cm segment (image 77, series 3 close per. This also seen on image 171, series 4. There are multiple diverticular the descending colon sigmoid colon. Rectum is grossly normal. Vascular/Lymphatic: There is an abdominal aortic stent graft. The stent appears widely patent without evidence leak. The SMA and celiac trunk are widely patent. Reproductive: Prostate gland is normal size Other: There is a small LEFT rectus sheath hematoma within measuring 3.0 x 3.5 by 5.3 cm (volume = 29 cm^3). Musculoskeletal: No aggressive osseous lesion. IMPRESSION: 1. No clear explanation for gastrointestinal bleeding. There is a focus of thickening along the descending colon which is asymmetric and cannot exclude a mucosal neoplasm. There also multiple diverticula of the descending colon and sigmoid colon which could also be a source of hemorrhage. No evidence of bowel ischemia. SMA and celiac trunk widely patent. 2. Small acute hematoma within the LEFT rectus muscle with a calculated volume of 30 cubic  cm. Electronically Signed   By: Suzy Bouchard M.D.   On: 06/05/2017 20:26    EKG: Independently reviewed   Assessment/Plan Principal Problem:   Lower GI bleed Admit to stepdown/inpatient. Keep nothing by mouth. Gentle IV fluids Hold aspirin. Pantoprazole 40 mg IVPB every 12 hours. Monitor hematocrit and hemoglobin. GI will evaluate in the morning.  Active Problems:   Skin cancer Continue local care.    COPD (chronic obstructive pulmonary disease) (HCC)   Asthma Continue supplemental oxygen as needed. Continue Airduo respiclick or formulary equivalent twice a day. Continue a scheduled and as needed bronchodilators.    Sleep apnea Continue BiPAP at bedtime.    Hypothyroidism Continue levothyroxine 112 g by mouth daily. Monitor TSH as needed.    Hyperlipidemia Continue simvastatin 20 mg by mouth at bedtime. Monitor LFTs as needed       DVT  prophylaxis: SCDs. Code Status: Full code. Family Communication: His daughter was present in the ED room. Disposition Plan: Admit for serial hemoglobin levels trending and GI evaluation in a.m. Consults called: Dr. Adriana Mccallum (gastroenterology) Admission status: Inpatient/stepdown.   Reubin Milan MD [facility] Pager [phone].  If 7PM-7AM, please contact night-coverage [email or web address] Password [facility]  06/05/2017, 10:24 PM

## 2017-06-06 LAB — CBC
HCT: 27.8 % — ABNORMAL LOW (ref 39.0–52.0)
HCT: 22.2 % — ABNORMAL LOW (ref 39.0–52.0)
Hemoglobin: 9.6 g/dL — ABNORMAL LOW (ref 13.0–17.0)
Hemoglobin: 7.7 g/dL — ABNORMAL LOW (ref 13.0–17.0)
MCH: 34 pg (ref 26.0–34.0)
MCH: 34.2 pg — ABNORMAL HIGH (ref 26.0–34.0)
MCHC: 34.5 g/dL (ref 30.0–36.0)
MCHC: 34.7 g/dL (ref 30.0–36.0)
MCV: 98.6 fL (ref 78.0–100.0)
MCV: 98.7 fL (ref 78.0–100.0)
PLATELETS: 232 10*3/uL (ref 150–400)
PLATELETS: 212 10*3/uL (ref 150–400)
RBC: 2.82 MIL/uL — AB (ref 4.22–5.81)
RBC: 2.25 MIL/uL — AB (ref 4.22–5.81)
RDW: 14.6 % (ref 11.5–15.5)
RDW: 14.7 % (ref 11.5–15.5)
WBC: 11.5 10*3/uL — AB (ref 4.0–10.5)
WBC: 11.9 10*3/uL — AB (ref 4.0–10.5)

## 2017-06-06 LAB — PREPARE RBC (CROSSMATCH)

## 2017-06-06 LAB — COMPREHENSIVE METABOLIC PANEL
ALK PHOS: 53 U/L (ref 38–126)
ALT: 25 U/L (ref 17–63)
ANION GAP: 7 (ref 5–15)
AST: 25 U/L (ref 15–41)
Albumin: 3.2 g/dL — ABNORMAL LOW (ref 3.5–5.0)
BUN: 25 mg/dL — ABNORMAL HIGH (ref 6–20)
CALCIUM: 7.8 mg/dL — AB (ref 8.9–10.3)
CO2: 23 mmol/L (ref 22–32)
CREATININE: 0.9 mg/dL (ref 0.61–1.24)
Chloride: 111 mmol/L (ref 101–111)
Glucose, Bld: 108 mg/dL — ABNORMAL HIGH (ref 65–99)
Potassium: 4.5 mmol/L (ref 3.5–5.1)
SODIUM: 141 mmol/L (ref 135–145)
Total Bilirubin: 1.1 mg/dL (ref 0.3–1.2)
Total Protein: 5.9 g/dL — ABNORMAL LOW (ref 6.5–8.1)

## 2017-06-06 LAB — HEMOGLOBIN: Hemoglobin: 8.9 g/dL — ABNORMAL LOW (ref 13.0–17.0)

## 2017-06-06 LAB — MRSA PCR SCREENING: MRSA by PCR: NEGATIVE

## 2017-06-06 MED ORDER — SODIUM CHLORIDE 0.9 % IV BOLUS (SEPSIS)
1000.0000 mL | Freq: Once | INTRAVENOUS | Status: AC
  Administered 2017-06-06: 1000 mL via INTRAVENOUS

## 2017-06-06 MED ORDER — FLEET ENEMA 7-19 GM/118ML RE ENEM
2.0000 | ENEMA | Freq: Once | RECTAL | Status: AC
  Administered 2017-06-07: 2 via RECTAL
  Filled 2017-06-06: qty 2

## 2017-06-06 MED ORDER — SODIUM CHLORIDE 0.9 % IV SOLN
Freq: Once | INTRAVENOUS | Status: AC
  Administered 2017-06-06: 21:00:00 via INTRAVENOUS

## 2017-06-06 NOTE — Consult Note (Signed)
Reason for Consult: Hematochezia and abnormal imaging Referring Physician: [facility]  Vincent Robinson HPI: This is a 83 year old male with a PMH of COPD, hyperlipidemia, Sleep apnea requiring BiPAPand right apical pneumothorax 05/20/3017 admitted with hematochezia.  As he was ambulating to his daughter's house he noticed a urge to have a bowel movement.  When had the bowel movement it was dark blood and he had a couple of more hematochezia events.  As a result he has some sensations of dizziness and he presented to the ER.  Work up in the ER revealed an HGB of 10.3 g/dL and his last HGB was at 13.9 g/dL.  A CT scan revealed diverticula in the transverse colon and the descending colon.  Within the descending colon there was evidence of an eccentric thickening of unknown significance.  Five years ago when he was living in [place] he had a similar bleeding episode.  His GI physician at that time performed a FFS and concluded that it was a diverticular bleed, per his report.  He states that his last colonoscopy was approximately 10 years ago.  Past Medical History:  Diagnosis Date  . Asthma   . COPD (chronic obstructive pulmonary disease) ([place])   . Hyperlipidemia   . Hypothyroidism   . Skin cancer 2017  . Sleep apnea     Past Surgical History:  Procedure Laterality Date  . bleeding ulcer  2016  . BYPASS AXILLA/BRACHIAL ARTERY  1998  . colon bleed  2012  . DEBRIDMENT OF DECUBITUS ULCER  2002  . THORACIC AORTA STENT  2009    Family History  Problem Relation Age of Onset  . Emphysema Mother   . Stroke Father   . Movement disorder Sister   . Aneurysm Brother        Thoracic aorta  . Macular degeneration Brother     Social History:  reports that he quit smoking about 55 years ago. He smoked 0.50 packs per day. He has never used smokeless tobacco. His alcohol and drug histories are not on file.  Allergies: No Known Allergies  Medications:  Scheduled: . dextromethorphan  30 mg Oral  BID  . fluticasone  1 spray Each Nare Daily  . levothyroxine  112 mcg Oral QAC breakfast  . mometasone-formoterol  2 puff Inhalation BID  . montelukast  10 mg Oral QHS  . pantoprazole (PROTONIX) IV  40 mg Intravenous Daily  . simvastatin  10 mg Oral QHS   Continuous: . sodium chloride 100 mL/hr at 06/06/17 0003    Results for orders placed or performed during the hospital encounter of 06/05/17 (from the past 24 hour(s))  ABO/Rh     Status: None   Collection Time: 06/05/17  4:50 PM  Result Value Ref Range   ABO/RH(D) A POS   Comprehensive metabolic panel     Status: Abnormal   Collection Time: 06/05/17  6:48 PM  Result Value Ref Range   Sodium 143 135 - 145 mmol/L   Potassium 4.0 3.5 - 5.1 mmol/L   Chloride 114 (H) 101 - 111 mmol/L   CO2 23 22 - 32 mmol/L   Glucose, Bld 98 65 - 99 mg/dL   BUN 22 (H) 6 - 20 mg/dL   Creatinine, Ser 0.96 0.61 - 1.24 mg/dL   Calcium 7.6 (L) 8.9 - 10.3 mg/dL   Total Protein 5.7 (L) 6.5 - 8.1 g/dL   Albumin 3.0 (L) 3.5 - 5.0 g/dL   AST 25 15 - 41 U/L  ALT 26 17 - 63 U/L   Alkaline Phosphatase 52 38 - 126 U/L   Total Bilirubin 0.9 0.3 - 1.2 mg/dL   GFR calc non Af Amer >60 >60 mL/min   GFR calc Af Amer >60 >60 mL/min   Anion gap 6 5 - 15  CBC     Status: Abnormal   Collection Time: 06/05/17  6:48 PM  Result Value Ref Range   WBC 10.0 4.0 - 10.5 K/uL   RBC 2.99 (L) 4.22 - 5.81 MIL/uL   Hemoglobin 10.3 (L) 13.0 - 17.0 g/dL   HCT 29.3 (L) 39.0 - 52.0 %   MCV 98.0 78.0 - 100.0 fL   MCH 34.4 (H) 26.0 - 34.0 pg   MCHC 35.2 30.0 - 36.0 g/dL   RDW 14.4 11.5 - 15.5 %   Platelets 225 150 - 400 K/uL  Type and screen [place]     Status: None   Collection Time: 06/05/17  6:48 PM  Result Value Ref Range   ABO/RH(D) A POS    Antibody Screen NEG    Sample Expiration 06/08/2017   Protime-INR     Status: None   Collection Time: 06/05/17  6:48 PM  Result Value Ref Range   Prothrombin Time 14.5 11.4 - 15.2 seconds   INR 1.12    I-Stat Chem 8, ED     Status: Abnormal   Collection Time: 06/05/17  7:01 PM  Result Value Ref Range   Sodium 144 135 - 145 mmol/L   Potassium 3.9 3.5 - 5.1 mmol/L   Chloride 111 101 - 111 mmol/L   BUN 22 (H) 6 - 20 mg/dL   Creatinine, Ser 0.90 0.61 - 1.24 mg/dL   Glucose, Bld 89 65 - 99 mg/dL   Calcium, Ion 1.01 (L) 1.15 - 1.40 mmol/L   TCO2 23 0 - 100 mmol/L   Hemoglobin 9.2 (L) 13.0 - 17.0 g/dL   HCT 27.0 (L) 39.0 - 52.0 %  I-Stat CG4 Lactic Acid, ED     Status: None   Collection Time: 06/05/17  7:02 PM  Result Value Ref Range   Lactic Acid, Venous 1.38 0.5 - 1.9 mmol/L  CBC     Status: Abnormal   Collection Time: 06/05/17 10:59 PM  Result Value Ref Range   WBC 9.7 4.0 - 10.5 K/uL   RBC 2.89 (L) 4.22 - 5.81 MIL/uL   Hemoglobin 9.4 (L) 13.0 - 17.0 g/dL   HCT 27.9 (L) 39.0 - 52.0 %   MCV 96.5 78.0 - 100.0 fL   MCH 32.5 26.0 - 34.0 pg   MCHC 33.7 30.0 - 36.0 g/dL   RDW 14.4 11.5 - 15.5 %   Platelets 224 150 - 400 K/uL  MRSA PCR Screening     Status: None   Collection Time: 06/05/17 11:30 PM  Result Value Ref Range   MRSA by PCR NEGATIVE NEGATIVE  CBC     Status: Abnormal   Collection Time: 06/06/17  3:29 AM  Result Value Ref Range   WBC 11.5 (H) 4.0 - 10.5 K/uL   RBC 2.82 (L) 4.22 - 5.81 MIL/uL   Hemoglobin 9.6 (L) 13.0 - 17.0 g/dL   HCT 27.8 (L) 39.0 - 52.0 %   MCV 98.6 78.0 - 100.0 fL   MCH 34.0 26.0 - 34.0 pg   MCHC 34.5 30.0 - 36.0 g/dL   RDW 14.6 11.5 - 15.5 %   Platelets 232 150 - 400 K/uL  Comprehensive metabolic panel  Status: Abnormal   Collection Time: 06/06/17  3:29 AM  Result Value Ref Range   Sodium 141 135 - 145 mmol/L   Potassium 4.5 3.5 - 5.1 mmol/L   Chloride 111 101 - 111 mmol/L   CO2 23 22 - 32 mmol/L   Glucose, Bld 108 (H) 65 - 99 mg/dL   BUN 25 (H) 6 - 20 mg/dL   Creatinine, Ser 0.90 0.61 - 1.24 mg/dL   Calcium 7.8 (L) 8.9 - 10.3 mg/dL   Total Protein 5.9 (L) 6.5 - 8.1 g/dL   Albumin 3.2 (L) 3.5 - 5.0 g/dL   AST 25 15 - 41 U/L   ALT  25 17 - 63 U/L   Alkaline Phosphatase 53 38 - 126 U/L   Total Bilirubin 1.1 0.3 - 1.2 mg/dL   GFR calc non Af Amer >60 >60 mL/min   GFR calc Af Amer >60 >60 mL/min   Anion gap 7 5 - 15  Hemoglobin     Status: Abnormal   Collection Time: 06/06/17 10:39 AM  Result Value Ref Range   Hemoglobin 8.9 (L) 13.0 - 17.0 g/dL     Ct Abdomen Pelvis W Contrast  Result Date: 06/05/2017 CLINICAL DATA:  Rectal bleeding.  Hypotensive. EXAM: CT ABDOMEN AND PELVIS WITH CONTRAST TECHNIQUE: Multidetector CT imaging of the abdomen and pelvis was performed using the standard protocol following bolus administration of intravenous contrast. CONTRAST:  182mL ISOVUE-300 IOPAMIDOL (ISOVUE-300) INJECTION 61% COMPARISON:  CT 07/15/2015 FINDINGS: Lower chest: Lung bases are clear. Hepatobiliary: No focal hepatic lesion. No biliary duct dilatation. Gallbladder is normal. Common bile duct is normal. Pancreas: Pancreas is normal. No ductal dilatation. No pancreatic inflammation. Spleen: Normal spleen Adrenals/urinary tract: Adrenal glands and kidneys are normal. The ureters and bladder normal. Stomach/Bowel: Stomach, duodenum, and small bowel are normal. Cecum oriented ventral midline and superiorly in the abdomen. No evidence of cecal dilatation or twisting. Appendix not identified. There several diverticulum along the transverse colon descending colon. Region of asymmetry mild wall thickening at the level of the proximal descending colon seen best on coronal image 76, series 3. No obstruction related to this of focal wall thickening which occurs over approximately 4 cm segment (image 77, series 3 close per. This also seen on image 171, series 4. There are multiple diverticular the descending colon sigmoid colon. Rectum is grossly normal. Vascular/Lymphatic: There is an abdominal aortic stent graft. The stent appears widely patent without evidence leak. The SMA and celiac trunk are widely patent. Reproductive: Prostate gland is normal  size Other: There is a small LEFT rectus sheath hematoma within measuring 3.0 x 3.5 by 5.3 cm (volume = 29 cm^3). Musculoskeletal: No aggressive osseous lesion. IMPRESSION: 1. No clear explanation for gastrointestinal bleeding. There is a focus of thickening along the descending colon which is asymmetric and cannot exclude a mucosal neoplasm. There also multiple diverticula of the descending colon and sigmoid colon which could also be a source of hemorrhage. No evidence of bowel ischemia. SMA and celiac trunk widely patent. 2. Small acute hematoma within the LEFT rectus muscle with a calculated volume of 30 cubic cm. Electronically Signed   By: Suzy Bouchard M.D.   On: 06/05/2017 20:26    ROS:  As stated above in the HPI otherwise negative.  Blood pressure (!) 108/49, pulse 73, temperature 97.8 F (36.6 C), temperature source Oral, resp. rate (!) 23, height 5\' 7"  (1.702 m), weight 86.6 kg (191 lb), SpO2 94 %.    PE: Gen:  NAD, Alert and Oriented HEENT:  [place]/AT, EOMI Neck: Supple, no LAD Lungs: CTA Bilaterally CV: RRR without M/G/R ABM: Soft, NTND, +BS Ext: No C/C/E  Assessment/Plan: 1) Hematochezia. 2) Diverticula. 3) Abnormal CT scan.   I believe the patient had a diverticular bleed.  He had a history of this bleeding 5 years ago, however, with the thickening in the descending colon, I will perform a flexible sigmoidoscopy for further evaluation.  Plan: 1) FFS tomorrow.  Other [place] D 06/06/2017, 4:10 PM

## 2017-06-06 NOTE — Care Management Note (Signed)
Case Management Note  Patient Details  Name: Vincent Robinson MRN: [id number] Date of Birth: 1923/07/25  Subjective/Objective:                  83 y.o. male past medical history of asthma COPD skin cancer recently discharged from the hospital on 05/23/2017 for right apical pneumothorax comes to the emergency room for rectal bleeding distorted the day prior to admission with multiple dark blood bowel movements that have been on 2 different locations with the last one he felt dizzy he denies any NSAIDs.  Action/Plan: Date:  June 06, 2017 Chart reviewed for concurrent status and case management needs. Will continue to follow patient progress. Discharge Planning: following for needs Expected discharge date: [id number] Velva Harman, BSN, [place], [place] Expected Discharge Date:   (unknown)               Expected Discharge Plan:  Home/Self Care  In-House Referral:     Discharge planning Services  CM Consult  Post Acute Care Choice:    Choice offered to:     DME Arranged:    DME Agency:     HH Arranged:    [place] Agency:     Status of Service:  In process, will continue to follow  If discussed at Long Length of Stay Meetings, dates discussed:    Additional Comments:  [name], RN 06/06/2017, 8:34 AM

## 2017-06-06 NOTE — ED Provider Notes (Signed)
[facility] DEPT Provider Note   CSN: [id number] Arrival date & time: 06/05/17  1820     History   Chief Complaint No chief complaint on file.   HPI Vincent Robinson is a 83 y.o. male.  HPI   83 yo M with h/o HTN, HLD, h/o diverticulosis her with blood in stool. Pt states he was in his usual state of health until this afternoon. HE reports that he felt sudden urge to go to the bathroom, after which he had a large, grossly bloody bowel movement. He then reportedly had two more large, grossly bloody BM. He then felt lightheaded and like he was going to pass out, and was unable to ambulate. EMS was called and pt reportedly with SBP 80-90s on their arrival. He was given fluids with improvement. No LOC. Currently, he feels fatigued but states he no longer feels need to go to the bathroom. He is on ASA but no othe rblood thinners. Denies CP or SOB. Denies abdominal pain.  Past Medical History:  Diagnosis Date  . Asthma   . COPD (chronic obstructive pulmonary disease) ([place])   . Hyperlipidemia   . Hypothyroidism   . Skin cancer 2017  . Sleep apnea     Patient Active Problem List   Diagnosis Date Noted  . Lower GI bleed 06/05/2017  . Sleep apnea 06/05/2017  . Hypothyroidism 06/05/2017  . Hyperlipidemia 06/05/2017  . Asthma 06/05/2017  . ILD (interstitial lung disease) ([place]) 05/28/2017  . Pneumothorax on right 05/20/2017  . COPD (chronic obstructive pulmonary disease) ([place]) 05/20/2017  . Pneumothorax 05/20/2017  . Skin cancer 10/23/2015    Past Surgical History:  Procedure Laterality Date  . bleeding ulcer  2016  . BYPASS AXILLA/BRACHIAL ARTERY  1998  . colon bleed  2012  . DEBRIDMENT OF DECUBITUS ULCER  2002  . THORACIC AORTA STENT  2009       Home Medications    Prior to Admission medications   Medication Sig Start Date End Date Taking? Authorizing Provider  albuterol (PROVENTIL HFA;VENTOLIN HFA) 108 (90 Base) MCG/ACT inhaler Inhale 1 puff into the lungs 2 (two)  times daily. 05/20/17  Yes Mannam, Praveen, MD  aspirin (ASPIRIN 81) 81 MG chewable tablet Chew 81 mg by mouth daily.   Yes [provider]  dextromethorphan (DELSYM) 30 MG/5ML liquid Take 5 mLs (30 mg total) by mouth 2 (two) times daily. 05/23/17  Yes Ollis, Brandi L, NP  fluticasone (VERAMYST) 27.5 MCG/SPRAY nasal spray Place 1 spray into the nose daily.   Yes [provider]  Fluticasone-Salmeterol (AIRDUO RESPICLICK 017/49) 449-67 MCG/ACT AEPB Inhale 1 puff into the lungs 2 (two) times daily. 03/05/17  Yes Mannam, Praveen, MD  levothyroxine (SYNTHROID, LEVOTHROID) 112 MCG tablet Take 112 mcg by mouth daily before breakfast.   Yes [provider]  montelukast (SINGULAIR) 10 MG tablet Take 1 tablet (10 mg total) by mouth at bedtime. 05/28/17  Yes Rigoberto Noel, MD  simvastatin (ZOCOR) 20 MG tablet Take 10 mg by mouth at bedtime. 03/22/17  Yes [provider]  oxybutynin (DITROPAN) 5 MG tablet Take 5 mg by mouth every 8 (eight) hours as needed for bladder spasms.    [provider]    Family History Family History  Problem Relation Age of Onset  . Emphysema Mother   . Stroke Father   . Movement disorder Sister   . Aneurysm Brother        Thoracic aorta  . Macular degeneration Brother  Social History Social History  Substance Use Topics  . Smoking status: Former Smoker    Packs/day: 0.50    Quit date: 08/06/1961  . Smokeless tobacco: Never Used  . Alcohol use Not on file     Allergies   Patient has no known allergies.   Review of Systems Review of Systems  Constitutional: Positive for fatigue. Negative for chills and fever.  HENT: Negative for congestion and rhinorrhea.   Eyes: Negative for visual disturbance.  Respiratory: Negative for cough, shortness of breath and wheezing.   Cardiovascular: Negative for chest pain and leg swelling.  Gastrointestinal: Positive for blood in stool and diarrhea. Negative for abdominal pain, nausea  and vomiting.  Genitourinary: Negative for dysuria and flank pain.  Musculoskeletal: Negative for neck pain and neck stiffness.  Skin: Negative for rash and wound.  Allergic/Immunologic: Negative for immunocompromised state.  Neurological: Negative for syncope, weakness and headaches.  All other systems reviewed and are negative.    Physical Exam Updated Vital Signs BP (!) 88/47   Pulse 65   Temp (!) 97.4 F (36.3 C) (Oral)   Resp 17   Ht 5\' 7"  (1.702 m)   Wt 86.6 kg (191 lb)   SpO2 93%   BMI 29.91 kg/m   Physical Exam  Constitutional: He is oriented to person, place, and time. He appears well-developed and well-nourished. No distress.  HENT:  Head: Normocephalic and atraumatic.  Eyes: Conjunctivae are normal.  Neck: Neck supple.  Cardiovascular: Normal rate, regular rhythm and normal heart sounds.  Exam reveals no friction rub.   No murmur heard. Pulmonary/Chest: Effort normal and breath sounds normal. No respiratory distress. He has no wheezes. He has no rales.  Abdominal: Soft. Bowel sounds are normal. He exhibits no distension. There is tenderness (mild, LLQ).  Genitourinary:  Genitourinary Comments: No active bleeding per rectum. No bleeding hemorrhoids. Stool grossly bloody.  Musculoskeletal: He exhibits no edema.  Neurological: He is alert and oriented to person, place, and time. He exhibits normal muscle tone.  Skin: Skin is warm. Capillary refill takes less than 2 seconds.  Psychiatric: He has a normal mood and affect.  Nursing note and vitals reviewed.    ED Treatments / Results  Labs (all labs ordered are listed, but only abnormal results are displayed) Labs Reviewed  COMPREHENSIVE METABOLIC PANEL - Abnormal; Notable for the following:       Result Value   Chloride 114 (*)    BUN 22 (*)    Calcium 7.6 (*)    Total Protein 5.7 (*)    Albumin 3.0 (*)    All other components within normal limits  CBC - Abnormal; Notable for the following:    RBC 2.99  (*)    Hemoglobin 10.3 (*)    HCT 29.3 (*)    MCH 34.4 (*)    All other components within normal limits  CBC - Abnormal; Notable for the following:    RBC 2.89 (*)    Hemoglobin 9.4 (*)    HCT 27.9 (*)    All other components within normal limits  CBC - Abnormal; Notable for the following:    WBC 11.5 (*)    RBC 2.82 (*)    Hemoglobin 9.6 (*)    HCT 27.8 (*)    All other components within normal limits  COMPREHENSIVE METABOLIC PANEL - Abnormal; Notable for the following:    Glucose, Bld 108 (*)    BUN 25 (*)    Calcium 7.8 (*)  Total Protein 5.9 (*)    Albumin 3.2 (*)    All other components within normal limits  I-STAT CHEM 8, ED - Abnormal; Notable for the following:    BUN 22 (*)    Calcium, Ion 1.01 (*)    Hemoglobin 9.2 (*)    HCT 27.0 (*)    All other components within normal limits  MRSA PCR SCREENING  PROTIME-INR  HEMOGLOBIN  I-STAT CG4 LACTIC ACID, ED  POC OCCULT BLOOD, ED  TYPE AND SCREEN  ABO/RH    EKG  EKG Interpretation None       Radiology Dg Chest 2 View  Result Date: 06/04/2017 CLINICAL DATA:  Recent pneumothorax.  COPD EXAM: CHEST  2 VIEW COMPARISON:  May 27, 2017 FINDINGS: Previously noted right-sided pneumothorax is no longer evident. There is interstitial fibrotic type change throughout the lungs bilaterally. There is no frank edema or consolidation. There is focal scarring in the bases. Heart is enlarged with pulmonary vascularity within normal limits. Patient is status post coronary artery bypass grafting. There is aortic atherosclerosis. There is degenerative change in the thoracic spine and each shoulder. No evident adenopathy. IMPRESSION: No evident pneumothorax. Underlying interstitial fibrotic change with mild bibasilar scarring. No edema or consolidation. Stable cardiac prominence. There is aortic atherosclerosis. Patient is status post coronary artery bypass grafting. Aortic Atherosclerosis (ICD10-I70.0). Electronically Signed   By:  Lowella Grip III M.D.   On: 06/04/2017 19:20   Ct Abdomen Pelvis W Contrast  Result Date: 06/05/2017 CLINICAL DATA:  Rectal bleeding.  Hypotensive. EXAM: CT ABDOMEN AND PELVIS WITH CONTRAST TECHNIQUE: Multidetector CT imaging of the abdomen and pelvis was performed using the standard protocol following bolus administration of intravenous contrast. CONTRAST:  121mL ISOVUE-300 IOPAMIDOL (ISOVUE-300) INJECTION 61% COMPARISON:  CT 07/15/2015 FINDINGS: Lower chest: Lung bases are clear. Hepatobiliary: No focal hepatic lesion. No biliary duct dilatation. Gallbladder is normal. Common bile duct is normal. Pancreas: Pancreas is normal. No ductal dilatation. No pancreatic inflammation. Spleen: Normal spleen Adrenals/urinary tract: Adrenal glands and kidneys are normal. The ureters and bladder normal. Stomach/Bowel: Stomach, duodenum, and small bowel are normal. Cecum oriented ventral midline and superiorly in the abdomen. No evidence of cecal dilatation or twisting. Appendix not identified. There several diverticulum along the transverse colon descending colon. Region of asymmetry mild wall thickening at the level of the proximal descending colon seen best on coronal image 76, series 3. No obstruction related to this of focal wall thickening which occurs over approximately 4 cm segment (image 77, series 3 close per. This also seen on image 171, series 4. There are multiple diverticular the descending colon sigmoid colon. Rectum is grossly normal. Vascular/Lymphatic: There is an abdominal aortic stent graft. The stent appears widely patent without evidence leak. The SMA and celiac trunk are widely patent. Reproductive: Prostate gland is normal size Other: There is a small LEFT rectus sheath hematoma within measuring 3.0 x 3.5 by 5.3 cm (volume = 29 cm^3). Musculoskeletal: No aggressive osseous lesion. IMPRESSION: 1. No clear explanation for gastrointestinal bleeding. There is a focus of thickening along the  descending colon which is asymmetric and cannot exclude a mucosal neoplasm. There also multiple diverticula of the descending colon and sigmoid colon which could also be a source of hemorrhage. No evidence of bowel ischemia. SMA and celiac trunk widely patent. 2. Small acute hematoma within the LEFT rectus muscle with a calculated volume of 30 cubic cm. Electronically Signed   By: Suzy Bouchard M.D.   On: 06/05/2017  20:26    Procedures Procedures (including critical care time)  Medications Ordered in ED Medications  montelukast (SINGULAIR) tablet 10 mg (10 mg Oral Given 06/06/17 0003)  levothyroxine (SYNTHROID, LEVOTHROID) tablet 112 mcg (112 mcg Oral Given 06/06/17 1003)  oxybutynin (DITROPAN) tablet 5 mg (not administered)  simvastatin (ZOCOR) tablet 10 mg (10 mg Oral Given 06/06/17 0003)  dextromethorphan (DELSYM) 30 MG/5ML liquid 30 mg (30 mg Oral Given 06/06/17 1004)  albuterol (PROVENTIL) (2.5 MG/3ML) 0.083% nebulizer solution 2.5 mg (not administered)  pantoprazole (PROTONIX) injection 40 mg (40 mg Intravenous Given 06/06/17 0003)  0.9 %  sodium chloride infusion ( Intravenous New Bag/Given 06/06/17 0003)  fluticasone (FLONASE) 50 MCG/ACT nasal spray 1 spray (not administered)  mometasone-formoterol (DULERA) 100-5 MCG/ACT inhaler 2 puff (2 puffs Inhalation Given 06/06/17 0913)  iopamidol (ISOVUE-300) 61 % injection (100 mLs  Contrast Given 06/05/17 1956)     Initial Impression / Assessment and Plan / ED Course  I have reviewed the triage vital signs and the nursing notes.  Pertinent labs & imaging results that were available during my care of the patient were reviewed by me and considered in my medical decision making (see chart for details).     83 yo M here with grossly bloody BM x 3. I suspect this is likley 2/2 diverticular bleed, and pt reportedly has h/o same though he has not seen GI here. He did have some transient hypotension but this is resolved here. Hgb has dropped from  13-14 baseline to 9-10 here. Will type and screen, admit to SDU. Discussed with GI who will see pt in AM. Pt to be NPO after MN. CT scan obtained and shows no signs of active bleed or surgical abnormality. Incidentally, pt has rectus sheath hematoma - likely 2/2 minor trauma, no signs of extrav, and I do not suspect this is source of his anemia.  Final Clinical Impressions(s) / ED Diagnoses   Final diagnoses:  Lower GI bleed  Acute blood loss anemia    New Prescriptions Current Discharge Medication List       Duffy Bruce, MD 06/06/17 1101

## 2017-06-06 NOTE — Progress Notes (Signed)
[facility] PROGRESS NOTE    Progress Note  Vincent Robinson  BOF:[id number] DOB: [birth year] DOA: 06/05/2017 PCP: Katherina Mires, MD     Brief Narrative:   Vincent Robinson is an 83 y.o. male past medical history of asthma COPD skin cancer recently discharged from the hospital on 05/23/2017 for right apical pneumothorax comes to the emergency room for rectal bleeding distorted the day prior to admission with multiple dark blood bowel movements that have been on 2 different locations with the last one he felt dizzy he denies any NSAIDs.  Assessment/Plan:   Lower GI bleed Aspirin has been held he's been type and screen. Continue IV Protonix. Weighting GI recommendations keep patient nothing by mouth. Vital signs stable. Baseline hemoglobin 13 on admission 9.6, he is currently asymptomatic. Has not had a bowel movement on the floor I think his bleeding has stopped. He has been type and screen 2 units of packed blood cells.  Skin cancer: Continue local wound care.  COPD (chronic obstructive pulmonary disease) (H[facility]) Continue supplemental oxygen and inhalers.  Sleep apnea Continue Cpap at night.  COPD: Cont home oxygen.  Hypothyroidism Continue Synthroid.  Mild leukocytosis: Has remained afebrile relates no pain likely stress demargination. I will not start antibiotics at this point.   DVT prophylaxis: SCD's Family Communication:none Disposition Plan/Barrier to D/C: once GI bleed has improved Code Status:     Code Status Orders        Start     Ordered   06/05/17 2322  Full code  Continuous     06/05/17 2321    Code Status History    Date Active Date Inactive Code Status Order ID Comments User Context   05/20/2017  8:52 PM 05/23/2017  4:46 PM Full Code [id number]  Omar Person, NP ED   05/20/2017  8:40 PM 05/20/2017  8:52 PM Full Code [id number]  Vianne Bulls, MD ED        IV Access:    Peripheral IV   Procedures and diagnostic studies:   Dg Chest 2  View  Result Date: 06/04/2017 CLINICAL DATA:  Recent pneumothorax.  COPD EXAM: CHEST  2 VIEW COMPARISON:  May 27, 2017 FINDINGS: Previously noted right-sided pneumothorax is no longer evident. There is interstitial fibrotic type change throughout the lungs bilaterally. There is no frank edema or consolidation. There is focal scarring in the bases. Heart is enlarged with pulmonary vascularity within normal limits. Patient is status post coronary artery bypass grafting. There is aortic atherosclerosis. There is degenerative change in the thoracic spine and each shoulder. No evident adenopathy. IMPRESSION: No evident pneumothorax. Underlying interstitial fibrotic change with mild bibasilar scarring. No edema or consolidation. Stable cardiac prominence. There is aortic atherosclerosis. Patient is status post coronary artery bypass grafting. Aortic Atherosclerosis (ICD10-I70.0). Electronically Signed   By: Lowella Grip III M.D.   On: 06/04/2017 19:20   Ct Abdomen Pelvis W Contrast  Result Date: 06/05/2017 CLINICAL DATA:  Rectal bleeding.  Hypotensive. EXAM: CT ABDOMEN AND PELVIS WITH CONTRAST TECHNIQUE: Multidetector CT imaging of the abdomen and pelvis was performed using the standard protocol following bolus administration of intravenous contrast. CONTRAST:  118mL ISOVUE-300 IOPAMIDOL (ISOVUE-300) INJECTION 61% COMPARISON:  CT 07/15/2015 FINDINGS: Lower chest: Lung bases are clear. Hepatobiliary: No focal hepatic lesion. No biliary duct dilatation. Gallbladder is normal. Common bile duct is normal. Pancreas: Pancreas is normal. No ductal dilatation. No pancreatic inflammation. Spleen: Normal spleen Adrenals/urinary tract: Adrenal glands and kidneys are normal. The ureters and bladder  normal. Stomach/Bowel: Stomach, duodenum, and small bowel are normal. Cecum oriented ventral midline and superiorly in the abdomen. No evidence of cecal dilatation or twisting. Appendix not identified. There several  diverticulum along the transverse colon descending colon. Region of asymmetry mild wall thickening at the level of the proximal descending colon seen best on coronal image 76, series 3. No obstruction related to this of focal wall thickening which occurs over approximately 4 cm segment (image 77, series 3 close per. This also seen on image 171, series 4. There are multiple diverticular the descending colon sigmoid colon. Rectum is grossly normal. Vascular/Lymphatic: There is an abdominal aortic stent graft. The stent appears widely patent without evidence leak. The SMA and celiac trunk are widely patent. Reproductive: Prostate gland is normal size Other: There is a small LEFT rectus sheath hematoma within measuring 3.0 x 3.5 by 5.3 cm (volume = 29 cm^3). Musculoskeletal: No aggressive osseous lesion. IMPRESSION: 1. No clear explanation for gastrointestinal bleeding. There is a focus of thickening along the descending colon which is asymmetric and cannot exclude a mucosal neoplasm. There also multiple diverticula of the descending colon and sigmoid colon which could also be a source of hemorrhage. No evidence of bowel ischemia. SMA and celiac trunk widely patent. 2. Small acute hematoma within the LEFT rectus muscle with a calculated volume of 30 cubic cm. Electronically Signed   By: Suzy Bouchard M.D.   On: 06/05/2017 20:26     Medical Consultants:    None.  Anti-Infectives:   None  Subjective:    Vincent Robinson he relates he is hungry has had no further bleeding.  Objective:    Vitals:   06/06/17 0200 06/06/17 0300 06/06/17 0500 06/06/17 0600  BP: (!) 107/49 (!) 109/57 (!) 86/41 (!) 136/41  Pulse: 76 70 65 65  Resp: 20 18 17 16   Temp:  (!) 97 F (36.1 C)    TempSrc:  Axillary    SpO2: 96% 97% 97% 97%  Weight:      Height:        Intake/Output Summary (Last 24 hours) at 06/06/17 0727 Last data filed at 06/06/17 0600  Gross per 24 hour  Intake              595 ml  Output               200 ml  Net              395 ml   Filed Weights   06/05/17 1837  Weight: 86.6 kg (191 lb)    Exam: General exam: In no acute distress lying comfortably in bed. Respiratory system: Good air movement clear to auscultation. Cardiovascular system: Regular in rhythm with positive S1-S2 no murmurs or gallops. Gastrointestinal system: Soft nontender nondistended. Central nervous system: Awake alert and oriented 3. Extremities: No lower extremity edema. Skin: No rashes. Psychiatry: Judgement and insight appear normal. Mood & affect appropriate.    Data Reviewed:    Labs: Basic Metabolic Panel:  Recent Labs Lab 06/05/17 1848 06/05/17 1901 06/06/17 0329  NA 143 144 141  K 4.0 3.9 4.5  CL 114* 111 111  CO2 23  --  23  GLUCOSE 98 89 108*  BUN 22* 22* 25*  CREATININE 0.96 0.90 0.90  CALCIUM 7.6*  --  7.8*   GFR Estimated Creatinine Clearance: 56.2 mL/min (by C-G formula based on SCr of 0.9 mg/dL). Liver Function Tests:  Recent Labs Lab 06/05/17 1848 06/06/17 0329  AST  25 25  ALT 26 25  ALKPHOS 52 53  BILITOT 0.9 1.1  PROT 5.7* 5.9*  ALBUMIN 3.0* 3.2*   No results for input(s): LIPASE, AMYLASE in the last 168 hours. No results for input(s): AMMONIA in the last 168 hours. Coagulation profile  Recent Labs Lab 06/05/17 1848  INR 1.12    CBC:  Recent Labs Lab 06/05/17 1848 06/05/17 1901 06/05/17 2259 06/06/17 0329  WBC 10.0  --  9.7 11.5*  HGB 10.3* 9.2* 9.4* 9.6*  HCT 29.3* 27.0* 27.9* 27.8*  MCV 98.0  --  96.5 98.6  PLT 225  --  224 232   Cardiac Enzymes: No results for input(s): CKTOTAL, CKMB, CKMBINDEX, TROPONINI in the last 168 hours. BNP (last 3 results) No results for input(s): PROBNP in the last 8760 hours. CBG: No results for input(s): GLUCAP in the last 168 hours. D-Dimer: No results for input(s): DDIMER in the last 72 hours. Hgb A1c: No results for input(s): HGBA1C in the last 72 hours. Lipid Profile: No results for input(s):  CHOL, HDL, LDLCALC, TRIG, CHOLHDL, LDLDIRECT in the last 72 hours. Thyroid function studies: No results for input(s): TSH, T4TOTAL, T3FREE, THYROIDAB in the last 72 hours.  Invalid input(s): FREET3 Anemia work up: No results for input(s): VITAMINB12, FOLATE, FERRITIN, TIBC, IRON, RETICCTPCT in the last 72 hours. Sepsis Labs:  Recent Labs Lab 06/05/17 1848 06/05/17 1902 06/05/17 2259 06/06/17 0329  WBC 10.0  --  9.7 11.5*  LATICACIDVEN  --  1.38  --   --    Microbiology Recent Results (from the past 240 hour(s))  MRSA PCR Screening     Status: None   Collection Time: 06/05/17 11:30 PM  Result Value Ref Range Status   MRSA by PCR NEGATIVE NEGATIVE Final    Comment:        The GeneXpert MRSA Assay (FDA approved for NASAL specimens only), is one component of a comprehensive MRSA colonization surveillance program. It is not intended to diagnose MRSA infection nor to guide or monitor treatment for MRSA infections.      Medications:   . dextromethorphan  30 mg Oral BID  . fluticasone  1 spray Each Nare Daily  . levothyroxine  112 mcg Oral QAC breakfast  . mometasone-formoterol  2 puff Inhalation BID  . montelukast  10 mg Oral QHS  . pantoprazole (PROTONIX) IV  40 mg Intravenous Daily  . simvastatin  10 mg Oral QHS   Continuous Infusions: . sodium chloride 100 mL/hr at 06/06/17 0003     LOS: 1 day   Charlynne Cousins  [facility] Pager [phone]  *Please refer to [email or web address], password [email or web address] to get updated schedule on who will round on this patient, as hospitalists switch teams weekly. If 7PM-7AM, please contact night-coverage at [email or web address], password [email or web address] for any overnight needs.  06/06/2017, 7:27 AM

## 2017-06-06 NOTE — Progress Notes (Signed)
Initial Nutrition Assessment  DOCUMENTATION CODES:   Obesity unspecified  INTERVENTION:   RD will order vanilla Premier Protein when diet advanced   NUTRITION DIAGNOSIS:   Inadequate oral intake related to inability to eat (GIB) as evidenced by NPO status.  GOAL:   Patient will meet greater than or equal to 90% of their needs  MONITOR:   Diet advancement, Labs, Weight trends  REASON FOR ASSESSMENT:   Malnutrition Screening Tool    ASSESSMENT:    83 y.o. male with medical history significant of asthma, COPD, hyperlipidemia, hypothyroidism, skin cancer, sleep apnea on BiPAP who was recently admitted and discharged from 05/20/2017 2 05/23/2017 due to right apical pneumothorax is now coming to the emergency department with complaints of rectal bleeding.   Met with pt in room today. Pt reports good appetite and oral intake pta. Per chart, pt is weight stable. Pt currently NPO for GI consult today. Pt reports that he is hungry and would like to eat. Pt does not drink supplements at home. RD recommended Premier Protein; pt is willing to try this. Recommended good lean protein with meals.   Medications reviewed and include: synthroid, protonix   Labs reviewed: BUN 25(H), Ca 7.8(L) adj. 8.44(L), alb 3.2(L) Wbc- 11.5(H), Hgb 8.9(L), Hct 27.8(L)  Nutrition-Focused physical exam completed. Findings are no fat depletion, no muscle depletion, and no edema.   Diet Order:  Diet NPO time specified Diet NPO time specified  Skin:  Reviewed, no issues  Last BM:  pta  Height:   Ht Readings from Last 1 Encounters:  06/05/17 _0  (1.702 m)    Weight:   Wt Readings from Last 1 Encounters:  06/05/17 191 lb (86.6 kg)    Ideal Body Weight:  64.5 kg  BMI:  Body mass index is 29.91 kg/m.  Estimated Nutritional Needs:   Kcal:  1800-2100kcal/day   Protein:  87-104g/day   Fluid:  >1.8L/day   EDUCATION NEEDS:   Education needs addressed  Koleen Distance MS, RD, LDN Pager  [phone] After Hours Pager: [phone]

## 2017-06-07 ENCOUNTER — Encounter (HOSPITAL_COMMUNITY)
Admission: EM | Disposition: A | Discharge status: Home / Self Care | Payer: Self-pay | Source: Home / Self Care | Attending: Pulmonary Disease

## 2017-06-07 ENCOUNTER — Inpatient Hospital Stay (HOSPITAL_COMMUNITY): Payer: Medicare Other

## 2017-06-07 ENCOUNTER — Encounter (HOSPITAL_COMMUNITY): Payer: Self-pay | Admitting: *Deleted

## 2017-06-07 DIAGNOSIS — K922 Gastrointestinal hemorrhage, unspecified: Secondary | ICD-10-CM

## 2017-06-07 DIAGNOSIS — D62 Acute posthemorrhagic anemia: Secondary | ICD-10-CM

## 2017-06-07 DIAGNOSIS — R578 Other shock: Secondary | ICD-10-CM

## 2017-06-07 DIAGNOSIS — J449 Chronic obstructive pulmonary disease, unspecified: Secondary | ICD-10-CM

## 2017-06-07 HISTORY — PX: COLONOSCOPY: SHX5424

## 2017-06-07 LAB — CBC
HCT: 25 % — ABNORMAL LOW (ref 39.0–52.0)
HCT: 22.6 % — ABNORMAL LOW (ref 39.0–52.0)
Hemoglobin: 8.7 g/dL — ABNORMAL LOW (ref 13.0–17.0)
Hemoglobin: 7.9 g/dL — ABNORMAL LOW (ref 13.0–17.0)
MCH: 32.7 pg (ref 26.0–34.0)
MCH: 33.2 pg (ref 26.0–34.0)
MCHC: 34.8 g/dL (ref 30.0–36.0)
MCHC: 35 g/dL (ref 30.0–36.0)
MCV: 94 fL (ref 78.0–100.0)
MCV: 95 fL (ref 78.0–100.0)
PLATELETS: 183 10*3/uL (ref 150–400)
PLATELETS: 170 10*3/uL (ref 150–400)
RBC: 2.66 MIL/uL — ABNORMAL LOW (ref 4.22–5.81)
RBC: 2.38 MIL/uL — AB (ref 4.22–5.81)
RDW: 16.8 % — AB (ref 11.5–15.5)
RDW: 17.2 % — AB (ref 11.5–15.5)
WBC: 11.6 10*3/uL — ABNORMAL HIGH (ref 4.0–10.5)
WBC: 11.3 10*3/uL — AB (ref 4.0–10.5)

## 2017-06-07 LAB — TROPONIN I: Troponin I: <0.03 ng/mL (ref <0.03)

## 2017-06-07 LAB — HEMOGLOBIN AND HEMATOCRIT, BLOOD
HCT: 23.6 % — ABNORMAL LOW (ref 39.0–52.0)
HEMOGLOBIN: 8.2 g/dL — AB (ref 13.0–17.0)

## 2017-06-07 LAB — GLUCOSE, CAPILLARY: Glucose-Capillary: 92 mg/dL (ref 65–99)

## 2017-06-07 SURGERY — COLONOSCOPY
Anesthesia: Moderate Sedation

## 2017-06-07 MED ORDER — PANTOPRAZOLE SODIUM 40 MG IV SOLR: 40.0000 mg | Freq: Two times a day (BID) | INTRAVENOUS | Status: DC

## 2017-06-07 MED ORDER — SODIUM CHLORIDE 0.9 % IV BOLUS (SEPSIS)
1000.0000 mL | Freq: Once | INTRAVENOUS | Status: AC
  Administered 2017-06-07: 1000 mL via INTRAVENOUS

## 2017-06-07 MED ORDER — FENTANYL CITRATE (PF) 100 MCG/2ML IJ SOLN
INTRAMUSCULAR | Status: AC
  Filled 2017-06-07: qty 2

## 2017-06-07 MED ORDER — SODIUM CHLORIDE 0.9 % IV BOLUS (SEPSIS): 1000.0000 mL | Freq: Once | INTRAVENOUS | Status: DC

## 2017-06-07 MED ORDER — FENTANYL CITRATE (PF) 100 MCG/2ML IJ SOLN
INTRAMUSCULAR | Status: DC | PRN
  Administered 2017-06-07 (×3): 12.5 ug via INTRAVENOUS

## 2017-06-07 MED ORDER — MIDAZOLAM HCL 5 MG/ML IJ SOLN
INTRAMUSCULAR | Status: AC
  Filled 2017-06-07: qty 2

## 2017-06-07 MED ORDER — PANTOPRAZOLE SODIUM 40 MG IV SOLR
40.0000 mg | INTRAVENOUS | Status: DC
  Administered 2017-06-08 – 2017-06-11 (×4): 40 mg via INTRAVENOUS
  Filled 2017-06-07 (×4): qty 40

## 2017-06-07 MED ORDER — PHENYLEPHRINE HCL-NACL 10-0.9 MG/250ML-% IV SOLN
0.0000 ug/min | INTRAVENOUS | Status: DC
Start: 2017-06-07 — End: 2017-06-11
  Administered 2017-06-07 (×2): 20 ug/min via INTRAVENOUS
  Administered 2017-06-08: 50 ug/min via INTRAVENOUS
  Administered 2017-06-08: 70 ug/min via INTRAVENOUS
  Administered 2017-06-08: 40 ug/min via INTRAVENOUS
  Administered 2017-06-08 (×3): 70 ug/min via INTRAVENOUS
  Administered 2017-06-08: 60 ug/min via INTRAVENOUS
  Administered 2017-06-08: 50 ug/min via INTRAVENOUS
  Administered 2017-06-09: 40 ug/min via INTRAVENOUS
  Administered 2017-06-09: 60 ug/min via INTRAVENOUS
  Filled 2017-06-07 (×14): qty 250

## 2017-06-07 MED ORDER — TECHNETIUM TC 99M-LABELED RED BLOOD CELLS IV KIT
23.0000 | PACK | Freq: Once | INTRAVENOUS | Status: AC | PRN
  Administered 2017-06-07: 23 via INTRAVENOUS

## 2017-06-07 MED ORDER — SODIUM CHLORIDE 0.9 % IV SOLN: 250.0000 mL | INTRAVENOUS | Status: DC | PRN

## 2017-06-07 MED ORDER — MIDAZOLAM HCL 10 MG/2ML IJ SOLN
INTRAMUSCULAR | Status: DC | PRN
  Administered 2017-06-07 (×3): 1 mg via INTRAVENOUS

## 2017-06-07 NOTE — Interval H&P Note (Signed)
History and Physical Interval Note:  06/07/2017 2:42 PM  Vincent Robinson  has presented today for surgery, with the diagnosis of Hematochezia and abnormal CT scan  The various methods of treatment have been discussed with the patient and family. After consideration of risks, benefits and other options for treatment, the patient has consented to  Procedure(s): FLEXIBLE SIGMOIDOSCOPY (N/A) as a surgical intervention .  The patient's history has been reviewed, patient examined, no change in status, stable for surgery.  I have reviewed the patient's chart and labs.  Questions were answered to the patient's satisfaction.     Weaver Tweed D

## 2017-06-07 NOTE — Consult Note (Signed)
Reason for consult: Hypotension / shock  In summary: 83 yo M with multiple medical problems presented with hematochezia and orthostatic hypotension. Patient received PRBCs and fluid boluses however remained hypotensive for which he has been started on phenylephrine.  On my assessment:  General exam: In no acute distress lying comfortably in bed. Respiratory system: Good air movement clear to auscultation. Cardiovascular system: Regular in rhythm with positive S1-S2 no murmurs or gallops. Gastrointestinal system: Soft nontender nondistended. Central nervous system: Awake alert and oriented 3. Extremities: No lower extremity edema.  I reviewed imaging and blood work. Patient is critically ill in the ICU and I am managing the patient for the following  1. Acute blood loss anemia 2. Lower GI bleeding 3. Hypovolemic shock  Plan: - no signs of hypoperfusion at the moment, continue to monitor and recheck lactic acid - monitor h and h, transfuse if less than 7. No coagulopathy - NPO, endoscopy by GI - home CPAP and sua-neb - PPI and SCDs  Samul Dada, MD CCM attending

## 2017-06-07 NOTE — Significant Event (Signed)
Patient remains hypotensive despite giving 2 units of packed red blood cell transfusion and fluid bolus of 500 mL. I have ordered another 2 L normal saline bolus. Discussed with Dr. Benson Norway, gastroenterologist who advised to get a nuclear bleeding scan. I also discussed with pulmonary critical care Dr. Oletta Darter, who advised to start patient on Neo-Synephrine and will be seeing patient in consult.  Gean Birchwood.

## 2017-06-07 NOTE — Op Note (Signed)
[facility] Patient Name: Vincent Robinson Procedure Date: 06/07/2017 MRN: [id number] Attending MD: Carol Ada , MD Date of Birth: 21-Jan-1923 CSN: [id number] Age: 83 Admit Type: Inpatient Procedure:                Colonoscopy Indications:              Hematochezia, Abnormal CT of the GI tract Providers:                Carol Ada, MD, Vista Lawman, RN, Cletis Athens,                            Technician Referring MD:              Medicines:                Midazolam 3 mg IV, Fentanyl 37.5 micrograms IV Complications:            No immediate complications. Estimated Blood Loss:     Estimated blood loss: none. Procedure:                Pre-Anesthesia Assessment:                           - Prior to the procedure, a History and Physical                            was performed, and patient medications and                            allergies were reviewed. The patient's tolerance of                            previous anesthesia was also reviewed. The risks                            and benefits of the procedure and the sedation                            options and risks were discussed with the patient.                            All questions were answered, and informed consent                            was obtained. Prior Anticoagulants: The patient has                            taken no previous anticoagulant or antiplatelet                            agents. ASA Grade Assessment: III - A patient with                            severe systemic disease. After reviewing the risks  and benefits, the patient was deemed in                            satisfactory condition to undergo the procedure.                           - Sedation was administered by an endoscopy nurse.                            The sedation level attained was moderate.                           After obtaining informed consent, the colonoscope                            was  passed under direct vision. Throughout the                            procedure, the patient's blood pressure, pulse, and                            oxygen saturations were monitored continuously. The                            EC-[id number] ([id number]) scope was introduced through                            the anus and advanced to the the cecum, identified                            by appendiceal orifice and ileocecal valve. After                            obtaining informed consent, the colonoscope was                            passed under direct vision. Throughout the                            procedure, the patient's blood pressure, pulse, and                            oxygen saturations were monitored continuously.The                            colonoscopy was performed with difficulty due to                            poor bowel prep. The patient tolerated the                            procedure well. The quality of the bowel  preparation was adequate. The ileocecal valve,                            appendiceal orifice, and rectum were photographed.                            The quality of the bowel preparation was adequate.                            The ileocecal valve, appendiceal orifice, and                            rectum were photographed. Scope In: 2:50:10 PM Scope Out: 3:16:10 PM Scope Withdrawal Time: 0 hours 6 minutes 49 seconds  Total Procedure Duration: 0 hours 26 minutes 0 seconds  Findings:      Multiple medium-mouthed diverticula were found in the descending colon       and transverse colon.      Red blood was found in the rectum, in the sigmoid colon, in the       descending colon, in the transverse colon and at the hepatic flexure.      The intent of the procedure was to perform a FFS, but following the       bleeding proximally lead to the cecum. In the ascending colon and cecum       adherent brown stool was encountered. From the  transverse colon and       distally into the rectum there were blood and clots. The diverticula       were found and there was no evidence of any masses or polyps. The       current findings are consistent with a diverticular bleed. Impression:               - Diverticulosis in the descending colon and in the                            transverse colon. Diverticular bleed.                           - Blood in the rectum, in the sigmoid colon, in the                            descending colon, in the transverse colon and at                            the hepatic flexure.                           - No specimens collected. Moderate Sedation:      Moderate (conscious) sedation was administered by the endoscopy nurse       and supervised by the endoscopist. The following parameters were       monitored: oxygen saturation, heart rate, blood pressure, and response       to care. Recommendation:           - Return patient to hospital ward for ongoing care.                           -  Resume regular diet.                           - Continue present medications.                           - Follow HGB and transfuse as necessary.                           - No repeat colonoscopy due to age. Procedure Code(s):        --- Professional ---                           [phone], Colonoscopy, flexible; diagnostic, including                            collection of specimen(s) by brushing or washing,                            when performed (separate procedure) Diagnosis Code(s):        --- Professional ---                           K62.5, Hemorrhage of anus and rectum                           K92.2, Gastrointestinal hemorrhage, unspecified                           K92.1, Melena (includes Hematochezia)                           K57.30, Diverticulosis of large intestine without                            perforation or abscess without bleeding                           R93.3, Abnormal findings on  diagnostic imaging of                            other parts of digestive tract CPT copyright 2016 American [employer]. All rights reserved. The codes documented in this report are preliminary and upon coder review may  be revised to meet current compliance requirements. Carol Ada, MD Carol Ada, MD 06/07/2017 4:05:17 PM This report has been signed electronically. Number of Addenda: 0

## 2017-06-07 NOTE — Consult Note (Signed)
PULMONARY / CRITICAL CARE MEDICINE   Name: Vincent Robinson MRN: [id number] DOB: Jul 19, 1923    ADMISSION DATE:  06/05/2017 CONSULTATION DATE:  8/16  REFERRING MD:  Olevia Bowens   CHIEF COMPLAINT:  Acute Lower GI bleed w/ Hemorrhagic Shock   HISTORY OF PRESENT ILLNESS:   This is a 83 year old male admitted 8/15 w/ cc: hematochezia w/ associated dizziness. In ER hgb showed > 4 gm drop from last known hgb draw. A CT scan in abd showed diverticula in both the transverse and descending colon. He has a known h/o diverticular bleed.  Course: 8/15 admitted to SDU.GI medicine consulted.   8/16 to 8/17: developed hypotension. Received 2 units PRBCs.  Started on Neo gtt. PCCM asked to see. Hgb 8.7 (from 7.7 prior to transfusions).   PAST MEDICAL HISTORY :  He  has a past medical history of Asthma; COPD (chronic obstructive pulmonary disease) ([place]); Hyperlipidemia; Hypothyroidism; Skin cancer (2017); and Sleep apnea.  PAST SURGICAL HISTORY: He  has a past surgical history that includes Bypass axilla/brachial artery (1998); Debridment of decubitus ulcer (2002); Thoracic aorta stent (2009); colon bleed (2012); and bleeding ulcer (2016).  No Known Allergies  No current facility-administered medications on file prior to encounter.    Current Outpatient Prescriptions on File Prior to Encounter  Medication Sig  . albuterol (PROVENTIL HFA;VENTOLIN HFA) 108 (90 Base) MCG/ACT inhaler Inhale 1 puff into the lungs 2 (two) times daily.  Marland Kitchen aspirin (ASPIRIN 81) 81 MG chewable tablet Chew 81 mg by mouth daily.  Marland Kitchen dextromethorphan (DELSYM) 30 MG/5ML liquid Take 5 mLs (30 mg total) by mouth 2 (two) times daily.  . fluticasone (VERAMYST) 27.5 MCG/SPRAY nasal spray Place 1 spray into the nose daily.  . Fluticasone-Salmeterol (AIRDUO RESPICLICK 397/67) 341-93 MCG/ACT AEPB Inhale 1 puff into the lungs 2 (two) times daily.  Marland Kitchen levothyroxine (SYNTHROID, LEVOTHROID) 112 MCG tablet Take 112 mcg by mouth daily before breakfast.  .  montelukast (SINGULAIR) 10 MG tablet Take 1 tablet (10 mg total) by mouth at bedtime.  . simvastatin (ZOCOR) 20 MG tablet Take 10 mg by mouth at bedtime.  Marland Kitchen oxybutynin (DITROPAN) 5 MG tablet Take 5 mg by mouth every 8 (eight) hours as needed for bladder spasms.    FAMILY HISTORY:  His indicated that the status of his mother is unknown. He indicated that the status of his father is unknown. He indicated that the status of his sister is unknown. He indicated that the status of his brother is unknown.    SOCIAL HISTORY: He  reports that he quit smoking about 55 years ago. He smoked 0.50 packs per day. He has never used smokeless tobacco.  REVIEW OF SYSTEMS:   Bolds are positive  Constitutional: weight loss, gain, night sweats, Fevers, chills, fatigue .  HEENT: headaches, Sore throat, sneezing, nasal congestion, post nasal drip, Difficulty swallowing, Tooth/dental problems, visual complaints visual changes, ear ache CV:  chest pain, radiates:,Orthopnea, PND, swelling in lower extremities, dizziness on admission, palpitations, syncope.  GI  heartburn, indigestion, abdominal pain which he attributes to hunger at this point,  nausea, vomiting, diarrhea, change in bowel habits, loss of appetite, bloody stools.  Resp: cough, productive, hemoptysis, dyspnea, chest pain, pleuritic.  Skin: rash or itching or icterus GU: dysuria, change in color of urine, urgency or frequency. flank pain, hematuria  MS: joint pain or swelling. decreased range of motion  Psych: change in mood or affect. depression or anxiety.  Neuro: difficulty with speech, weakness, numbness, ataxia  SUBJECTIVE:  Reports he is [place]   VITAL SIGNS: BP (!) 107/48   Pulse 60   Temp 98.5 F (36.9 C) (Axillary)   Resp (!) 24   Ht 5\' 7"  (1.702 m)   Wt 191 lb 2.2 oz (86.7 kg)   SpO2 94%   BMI 29.94 kg/m   HEMODYNAMICS:    VENTILATOR SETTINGS:    INTAKE / OUTPUT: I/O last 3 completed shifts: In: 4885.2 [I.V.:2755.2;  Blood:630; IV Piggyback:1500] Out: 1200 [Urine:1200]  PHYSICAL EXAMINATION: General:  83 year old male, resting comfortably in bed. He is in no distress.  Neuro:  Awake, oriented X 4. Moves all extremities. Has no gross defs  HEENT:  NCAT. MMM, no JVD  Cardiovascular:  SB w/ 1st degree HB, holosystolic Murmur IV/VI Lungs:  Clear, no accessory use  Abdomen:  Soft, not tender. Hyperactive bowel sounds  Musculoskeletal:  Equal st and bulk Skin:  Warm, dry, brisk CR. No edema   LABS:  BMET  Recent Labs Lab 06/05/17 1848 06/05/17 1901 06/06/17 0329  NA 143 144 141  K 4.0 3.9 4.5  CL 114* 111 111  CO2 23  --  23  BUN 22* 22* 25*  CREATININE 0.96 0.90 0.90  GLUCOSE 98 89 108*    Electrolytes  Recent Labs Lab 06/05/17 1848 06/06/17 0329  CALCIUM 7.6* 7.8*    CBC  Recent Labs Lab 06/06/17 0329  06/06/17 1838 06/07/17 0304 06/07/17 0739  WBC 11.5*  --  11.9*  --  11.6*  HGB 9.6*  < > 7.7* 8.2* 8.7*  HCT 27.8*  --  22.2* 23.6* 25.0*  PLT 232  --  212  --  183  < > = values in this interval not displayed.  Coag's  Recent Labs Lab 06/05/17 1848  INR 1.12    Sepsis Markers  Recent Labs Lab 06/05/17 1902  LATICACIDVEN 1.38    ABG No results for input(s): PHART, PCO2ART, PO2ART in the last 168 hours.  Liver Enzymes  Recent Labs Lab 06/05/17 1848 06/06/17 0329  AST 25 25  ALT 26 25  ALKPHOS 52 53  BILITOT 0.9 1.1  ALBUMIN 3.0* 3.2*    Cardiac Enzymes No results for input(s): TROPONINI, PROBNP in the last 168 hours.  Glucose No results for input(s): GLUCAP in the last 168 hours.  Imaging No results found.   STUDIES:    CULTURES:   ANTIBIOTICS:   SIGNIFICANT EVENTS:   LINES/TUBES:   DISCUSSION:   ASSESSMENT / PLAN:  H/o COPD (w/ mild obstruction) as well as MIld mild ILD felt d/t prior pulmonary infection during WW[place] H/o OSA  H/o spont PTX (resolved) P:   Cont supplemental oxygen  Cont home BDs Cont Nocturnal CPAP     Hypovolemic/hemorrhagic shock Heart murmur  First degree HB P:  Cont IVFs Titrate Neo  Cont tele Cycle CEs  Transfuse as indicated    At risk for AKI P:   Avoid hypotension  Renal dose meds Am chemistry  Cont IVFs   LGIB-->suspect diverticular  P:   NPO For flex sig and Nuc med scan See heme section    LGIB w/ acute blood loss anemia  ->hemorrhagic shock  ->s/p 2 units blood P:  Cont serial cbcs Transfuse as indicated    Hypothyroidism  P:   Cont supplementation     FAMILY  - Updates:   - Inter-disciplinary family meet or Palliative Care meeting due by:  [place]  My cct 51min  Erick Colace  ACNP-BC [place] Pager # [phone] OR # [phone] if no answer  06/07/2017, 8:20 AM

## 2017-06-07 NOTE — Progress Notes (Signed)
[facility] [place] Progress Note Patient Name: Vincent Robinson DOB: [birth year] MRN: [id number]   Date of Service  06/07/2017  HPI/Events of Note    eICU Interventions  Patient restarted on phenylephrine, currently at 60. hgb 8.7 > 7.9. No active BRB reported. Will follow next CBC, perform earlier. Consider empiric PRBC if pressor needs increase further.      Intervention Category Intermediate Interventions: Other:  Laqueta Bonaventura S. 06/07/2017, 11:10 PM

## 2017-06-07 NOTE — Progress Notes (Signed)
[facility] PROGRESS NOTE    Progress Note  [name]  BOF:[id number] DOB: 09/14/1923 DOA: 06/05/2017 PCP: Katherina Mires, MD     Brief Narrative:   Vincent Robinson is an 83 y.o. male past medical history of asthma COPD skin cancer recently discharged from the hospital on 05/23/2017 for right apical pneumothorax comes to the emergency room for rectal bleeding distorted the day prior to admission with multiple dark blood bowel movements that have been on 2 different locations with the last one he felt dizzy he denies any NSAIDs.  Assessment/Plan:   Lower GI bleed Likely diverticular bleed: Continue hold aspirin, he has been transfused 2 units of packed red , And 3 L of normal saline. Hemoglobin 7.7 repeated CBC this morning is pending. GI has been consulted recommended and a nuclear red tach cell scan and possible a flex sig. He is mildly hypotensive see below or further details. Keep NPO.  Hemorrhagic shock: He's been transfuse 2 units of packed red blood cells, 3 L of normal saline, despite this he remains hypotensive. Now on IV Neo-Synephrine. CCM has been consulted.  Skin cancer: Continue local wound care.  COPD (chronic obstructive pulmonary disease) (HCC) Continue supplemental oxygen and inhalers.  Sleep apnea Continue Cpap at night.  COPD: Cont home oxygen.  Hypothyroidism Continue Synthroid.  Mild leukocytosis: [place] afebrile.   DVT prophylaxis: SCD's Family Communication:none Disposition Plan/Barrier to D/C: once GI bleed has improved Code Status:     Code Status Orders        Start     Ordered   06/05/17 2322  Full code  Continuous     06/05/17 2321    Code Status History    Date Active Date Inactive Code Status Order ID Comments User Context   05/20/2017  8:52 PM 05/23/2017  4:46 PM Full Code [id number]  Omar Person, NP ED   05/20/2017  8:40 PM 05/20/2017  8:52 PM Full Code [id number]  Vianne Bulls, MD ED        IV Access:     Peripheral IV   Procedures and diagnostic studies:   Ct Abdomen Pelvis W Contrast  Result Date: 06/05/2017 CLINICAL DATA:  Rectal bleeding.  Hypotensive. EXAM: CT ABDOMEN AND PELVIS WITH CONTRAST TECHNIQUE: Multidetector CT imaging of the abdomen and pelvis was performed using the standard protocol following bolus administration of intravenous contrast. CONTRAST:  19mL ISOVUE-300 IOPAMIDOL (ISOVUE-300) INJECTION 61% COMPARISON:  CT 07/15/2015 FINDINGS: Lower chest: Lung bases are clear. Hepatobiliary: No focal hepatic lesion. No biliary duct dilatation. Gallbladder is normal. Common bile duct is normal. Pancreas: Pancreas is normal. No ductal dilatation. No pancreatic inflammation. Spleen: Normal spleen Adrenals/urinary tract: Adrenal glands and kidneys are normal. The ureters and bladder normal. Stomach/Bowel: Stomach, duodenum, and small bowel are normal. Cecum oriented ventral midline and superiorly in the abdomen. No evidence of cecal dilatation or twisting. Appendix not identified. There several diverticulum along the transverse colon descending colon. Region of asymmetry mild wall thickening at the level of the proximal descending colon seen best on coronal image 76, series 3. No obstruction related to this of focal wall thickening which occurs over approximately 4 cm segment (image 77, series 3 close per. This also seen on image 171, series 4. There are multiple diverticular the descending colon sigmoid colon. Rectum is grossly normal. Vascular/Lymphatic: There is an abdominal aortic stent graft. The stent appears widely patent without evidence leak. The SMA and celiac trunk are widely patent. Reproductive: Prostate gland is  normal size Other: There is a small LEFT rectus sheath hematoma within measuring 3.0 x 3.5 by 5.3 cm (volume = 29 cm^3). Musculoskeletal: No aggressive osseous lesion. IMPRESSION: 1. No clear explanation for gastrointestinal bleeding. There is a focus of thickening along the  descending colon which is asymmetric and cannot exclude a mucosal neoplasm. There also multiple diverticula of the descending colon and sigmoid colon which could also be a source of hemorrhage. No evidence of bowel ischemia. SMA and celiac trunk widely patent. 2. Small acute hematoma within the LEFT rectus muscle with a calculated volume of 30 cubic cm. Electronically Signed   By: Suzy Bouchard M.D.   On: 06/05/2017 20:26     Medical Consultants:    None.  Anti-Infectives:   None  Subjective:    Vincent Robinson He had multiple bowel movements overnight  Objective:    Vitals:   06/07/17 0615 06/07/17 0630 06/07/17 0645 06/07/17 0700  BP: (!) 105/47 (!) 96/41 (!) 100/51 (!) 111/46  Pulse: (!) 56 (!) 54 (!) 55 (!) 56  Resp: 16 18 18 19   Temp:      TempSrc:      SpO2: 98% 98% 97% 98%  Weight:      Height:        Intake/Output Summary (Last 24 hours) at 06/07/17 0717 Last data filed at 06/07/17 0630  Gross per 24 hour  Intake          4126.67 ml  Output              600 ml  Net          3526.67 ml   Filed Weights   06/05/17 1837  Weight: 86.6 kg (191 lb)    Exam: General exam: In no acute distress laying comfort Respiratory system: Good air movement and clear to auscultation. Cardiovascular system: Regular rate and rhythm with a Positive S1 and S2 no JVD Gastrointestinal system: positive bowel sounds, soft, nontender Central nervous system: Awake alert nd oriented  Extremities: no lower ext edema Skin: Multiple bruises Psychiatry: Judgement and insight appear normal. Mood & affect appropriate.    Data Reviewed:    Labs: Basic Metabolic Panel:  Recent Labs Lab 06/05/17 1848 06/05/17 1901 06/06/17 0329  NA 143 144 141  K 4.0 3.9 4.5  CL 114* 111 111  CO2 23  --  23  GLUCOSE 98 89 108*  BUN 22* 22* 25*  CREATININE 0.96 0.90 0.90  CALCIUM 7.6*  --  7.8*   GFR Estimated Creatinine Clearance: 56.2 mL/min (by C-G formula based on SCr of 0.9  mg/dL). Liver Function Tests:  Recent Labs Lab 06/05/17 1848 06/06/17 0329  AST 25 25  ALT 26 25  ALKPHOS 52 53  BILITOT 0.9 1.1  PROT 5.7* 5.9*  ALBUMIN 3.0* 3.2*   No results for input(s): LIPASE, AMYLASE in the last 168 hours. No results for input(s): AMMONIA in the last 168 hours. Coagulation profile  Recent Labs Lab 06/05/17 1848  INR 1.12    CBC:  Recent Labs Lab 06/05/17 1848 06/05/17 1901 06/05/17 2259 06/06/17 0329 06/06/17 1039 06/06/17 1838 06/07/17 0304  WBC 10.0  --  9.7 11.5*  --  11.9*  --   HGB 10.3* 9.2* 9.4* 9.6* 8.9* 7.7* 8.2*  HCT 29.3* 27.0* 27.9* 27.8*  --  22.2* 23.6*  MCV 98.0  --  96.5 98.6  --  98.7  --   PLT 225  --  224 232  --  212  --    Cardiac Enzymes: No results for input(s): CKTOTAL, CKMB, CKMBINDEX, TROPONINI in the last 168 hours. BNP (last 3 results) No results for input(s): PROBNP in the last 8760 hours. CBG: No results for input(s): GLUCAP in the last 168 hours. D-Dimer: No results for input(s): DDIMER in the last 72 hours. Hgb A1c: No results for input(s): HGBA1C in the last 72 hours. Lipid Profile: No results for input(s): CHOL, HDL, LDLCALC, TRIG, CHOLHDL, LDLDIRECT in the last 72 hours. Thyroid function studies: No results for input(s): TSH, T4TOTAL, T3FREE, THYROIDAB in the last 72 hours.  Invalid input(s): FREET3 Anemia work up: No results for input(s): VITAMINB12, FOLATE, FERRITIN, TIBC, IRON, RETICCTPCT in the last 72 hours. Sepsis Labs:  Recent Labs Lab 06/05/17 1848 06/05/17 1902 06/05/17 2259 06/06/17 0329 06/06/17 1838  WBC 10.0  --  9.7 11.5* 11.9*  LATICACIDVEN  --  1.38  --   --   --    Microbiology Recent Results (from the past 240 hour(s))  MRSA PCR Screening     Status: None   Collection Time: 06/05/17 11:30 PM  Result Value Ref Range Status   MRSA by PCR NEGATIVE NEGATIVE Final    Comment:        The GeneXpert MRSA Assay (FDA approved for NASAL specimens only), is one component of  a comprehensive MRSA colonization surveillance program. It is not intended to diagnose MRSA infection nor to guide or monitor treatment for MRSA infections.      Medications:   . dextromethorphan  30 mg Oral BID  . fluticasone  1 spray Each Nare Daily  . levothyroxine  112 mcg Oral QAC breakfast  . mometasone-formoterol  2 puff Inhalation BID  . montelukast  10 mg Oral QHS  . pantoprazole (PROTONIX) IV  40 mg Intravenous Daily  . simvastatin  10 mg Oral QHS  . sodium phosphate  2 enema Rectal Once   Continuous Infusions: . sodium chloride 100 mL/hr at 06/06/17 1930  . sodium chloride    . phenylephrine (NEO-SYNEPHRINE) Adult infusion 40 mcg/min (06/07/17 0600)  . sodium chloride       LOS: 2 days   Charlynne Cousins  [facility] Pager [phone]  *Please refer to [email or web address], password [email or web address] to get updated schedule on who will round on this patient, as hospitalists switch teams weekly. If 7PM-7AM, please contact night-coverage at [email or web address], password [email or web address] for any overnight needs.  06/07/2017, 7:17 AM

## 2017-06-07 NOTE — H&P (View-Only) (Signed)
Reason for Consult: Hematochezia and abnormal imaging Referring Physician: [facility]  Gervis Gaba HPI: This is a 83 year old male with a PMH of COPD, hyperlipidemia, Sleep apnea requiring BiPAPand right apical pneumothorax 05/20/3017 admitted with hematochezia.  As he was ambulating to his daughter's house he noticed a urge to have a bowel movement.  When had the bowel movement it was dark blood and he had a couple of more hematochezia events.  As a result he has some sensations of dizziness and he presented to the ER.  Work up in the ER revealed an HGB of 10.3 g/dL and his last HGB was at 13.9 g/dL.  A CT scan revealed diverticula in the transverse colon and the descending colon.  Within the descending colon there was evidence of an eccentric thickening of unknown significance.  Five years ago when he was living in [place] he had a similar bleeding episode.  His GI physician at that time performed a FFS and concluded that it was a diverticular bleed, per his report.  He states that his last colonoscopy was approximately 10 years ago.  Past Medical History:  Diagnosis Date  . Asthma   . COPD (chronic obstructive pulmonary disease) ([place])   . Hyperlipidemia   . Hypothyroidism   . Skin cancer 2017  . Sleep apnea     Past Surgical History:  Procedure Laterality Date  . bleeding ulcer  2016  . BYPASS AXILLA/BRACHIAL ARTERY  1998  . colon bleed  2012  . DEBRIDMENT OF DECUBITUS ULCER  2002  . THORACIC AORTA STENT  2009    Family History  Problem Relation Age of Onset  . Emphysema Mother   . Stroke Father   . Movement disorder Sister   . Aneurysm Brother        Thoracic aorta  . Macular degeneration Brother     Social History:  reports that he quit smoking about 55 years ago. He smoked 0.50 packs per day. He has never used smokeless tobacco. His alcohol and drug histories are not on file.  Allergies: No Known Allergies  Medications:  Scheduled: . dextromethorphan  30 mg Oral  BID  . fluticasone  1 spray Each Nare Daily  . levothyroxine  112 mcg Oral QAC breakfast  . mometasone-formoterol  2 puff Inhalation BID  . montelukast  10 mg Oral QHS  . pantoprazole (PROTONIX) IV  40 mg Intravenous Daily  . simvastatin  10 mg Oral QHS   Continuous: . sodium chloride 100 mL/hr at 06/06/17 0003    Results for orders placed or performed during the hospital encounter of 06/05/17 (from the past 24 hour(s))  ABO/Rh     Status: None   Collection Time: 06/05/17  4:50 PM  Result Value Ref Range   ABO/RH(D) A POS   Comprehensive metabolic panel     Status: Abnormal   Collection Time: 06/05/17  6:48 PM  Result Value Ref Range   Sodium 143 135 - 145 mmol/L   Potassium 4.0 3.5 - 5.1 mmol/L   Chloride 114 (H) 101 - 111 mmol/L   CO2 23 22 - 32 mmol/L   Glucose, Bld 98 65 - 99 mg/dL   BUN 22 (H) 6 - 20 mg/dL   Creatinine, Ser 0.96 0.61 - 1.24 mg/dL   Calcium 7.6 (L) 8.9 - 10.3 mg/dL   Total Protein 5.7 (L) 6.5 - 8.1 g/dL   Albumin 3.0 (L) 3.5 - 5.0 g/dL   AST 25 15 - 41 U/L  ALT 26 17 - 63 U/L   Alkaline Phosphatase 52 38 - 126 U/L   Total Bilirubin 0.9 0.3 - 1.2 mg/dL   GFR calc non Af Amer >60 >60 mL/min   GFR calc Af Amer >60 >60 mL/min   Anion gap 6 5 - 15  CBC     Status: Abnormal   Collection Time: 06/05/17  6:48 PM  Result Value Ref Range   WBC 10.0 4.0 - 10.5 K/uL   RBC 2.99 (L) 4.22 - 5.81 MIL/uL   Hemoglobin 10.3 (L) 13.0 - 17.0 g/dL   HCT 29.3 (L) 39.0 - 52.0 %   MCV 98.0 78.0 - 100.0 fL   MCH 34.4 (H) 26.0 - 34.0 pg   MCHC 35.2 30.0 - 36.0 g/dL   RDW 14.4 11.5 - 15.5 %   Platelets 225 150 - 400 K/uL  Type and screen [place]     Status: None   Collection Time: 06/05/17  6:48 PM  Result Value Ref Range   ABO/RH(D) A POS    Antibody Screen NEG    Sample Expiration 06/08/2017   Protime-INR     Status: None   Collection Time: 06/05/17  6:48 PM  Result Value Ref Range   Prothrombin Time 14.5 11.4 - 15.2 seconds   INR 1.12    I-Stat Chem 8, ED     Status: Abnormal   Collection Time: 06/05/17  7:01 PM  Result Value Ref Range   Sodium 144 135 - 145 mmol/L   Potassium 3.9 3.5 - 5.1 mmol/L   Chloride 111 101 - 111 mmol/L   BUN 22 (H) 6 - 20 mg/dL   Creatinine, Ser 0.90 0.61 - 1.24 mg/dL   Glucose, Bld 89 65 - 99 mg/dL   Calcium, Ion 1.01 (L) 1.15 - 1.40 mmol/L   TCO2 23 0 - 100 mmol/L   Hemoglobin 9.2 (L) 13.0 - 17.0 g/dL   HCT 27.0 (L) 39.0 - 52.0 %  I-Stat CG4 Lactic Acid, ED     Status: None   Collection Time: 06/05/17  7:02 PM  Result Value Ref Range   Lactic Acid, Venous 1.38 0.5 - 1.9 mmol/L  CBC     Status: Abnormal   Collection Time: 06/05/17 10:59 PM  Result Value Ref Range   WBC 9.7 4.0 - 10.5 K/uL   RBC 2.89 (L) 4.22 - 5.81 MIL/uL   Hemoglobin 9.4 (L) 13.0 - 17.0 g/dL   HCT 27.9 (L) 39.0 - 52.0 %   MCV 96.5 78.0 - 100.0 fL   MCH 32.5 26.0 - 34.0 pg   MCHC 33.7 30.0 - 36.0 g/dL   RDW 14.4 11.5 - 15.5 %   Platelets 224 150 - 400 K/uL  MRSA PCR Screening     Status: None   Collection Time: 06/05/17 11:30 PM  Result Value Ref Range   MRSA by PCR NEGATIVE NEGATIVE  CBC     Status: Abnormal   Collection Time: 06/06/17  3:29 AM  Result Value Ref Range   WBC 11.5 (H) 4.0 - 10.5 K/uL   RBC 2.82 (L) 4.22 - 5.81 MIL/uL   Hemoglobin 9.6 (L) 13.0 - 17.0 g/dL   HCT 27.8 (L) 39.0 - 52.0 %   MCV 98.6 78.0 - 100.0 fL   MCH 34.0 26.0 - 34.0 pg   MCHC 34.5 30.0 - 36.0 g/dL   RDW 14.6 11.5 - 15.5 %   Platelets 232 150 - 400 K/uL  Comprehensive metabolic panel  Status: Abnormal   Collection Time: 06/06/17  3:29 AM  Result Value Ref Range   Sodium 141 135 - 145 mmol/L   Potassium 4.5 3.5 - 5.1 mmol/L   Chloride 111 101 - 111 mmol/L   CO2 23 22 - 32 mmol/L   Glucose, Bld 108 (H) 65 - 99 mg/dL   BUN 25 (H) 6 - 20 mg/dL   Creatinine, Ser 0.90 0.61 - 1.24 mg/dL   Calcium 7.8 (L) 8.9 - 10.3 mg/dL   Total Protein 5.9 (L) 6.5 - 8.1 g/dL   Albumin 3.2 (L) 3.5 - 5.0 g/dL   AST 25 15 - 41 U/L   ALT  25 17 - 63 U/L   Alkaline Phosphatase 53 38 - 126 U/L   Total Bilirubin 1.1 0.3 - 1.2 mg/dL   GFR calc non Af Amer >60 >60 mL/min   GFR calc Af Amer >60 >60 mL/min   Anion gap 7 5 - 15  Hemoglobin     Status: Abnormal   Collection Time: 06/06/17 10:39 AM  Result Value Ref Range   Hemoglobin 8.9 (L) 13.0 - 17.0 g/dL     Ct Abdomen Pelvis W Contrast  Result Date: 06/05/2017 CLINICAL DATA:  Rectal bleeding.  Hypotensive. EXAM: CT ABDOMEN AND PELVIS WITH CONTRAST TECHNIQUE: Multidetector CT imaging of the abdomen and pelvis was performed using the standard protocol following bolus administration of intravenous contrast. CONTRAST:  119mL ISOVUE-300 IOPAMIDOL (ISOVUE-300) INJECTION 61% COMPARISON:  CT 07/15/2015 FINDINGS: Lower chest: Lung bases are clear. Hepatobiliary: No focal hepatic lesion. No biliary duct dilatation. Gallbladder is normal. Common bile duct is normal. Pancreas: Pancreas is normal. No ductal dilatation. No pancreatic inflammation. Spleen: Normal spleen Adrenals/urinary tract: Adrenal glands and kidneys are normal. The ureters and bladder normal. Stomach/Bowel: Stomach, duodenum, and small bowel are normal. Cecum oriented ventral midline and superiorly in the abdomen. No evidence of cecal dilatation or twisting. Appendix not identified. There several diverticulum along the transverse colon descending colon. Region of asymmetry mild wall thickening at the level of the proximal descending colon seen best on coronal image 76, series 3. No obstruction related to this of focal wall thickening which occurs over approximately 4 cm segment (image 77, series 3 close per. This also seen on image 171, series 4. There are multiple diverticular the descending colon sigmoid colon. Rectum is grossly normal. Vascular/Lymphatic: There is an abdominal aortic stent graft. The stent appears widely patent without evidence leak. The SMA and celiac trunk are widely patent. Reproductive: Prostate gland is normal  size Other: There is a small LEFT rectus sheath hematoma within measuring 3.0 x 3.5 by 5.3 cm (volume = 29 cm^3). Musculoskeletal: No aggressive osseous lesion. IMPRESSION: 1. No clear explanation for gastrointestinal bleeding. There is a focus of thickening along the descending colon which is asymmetric and cannot exclude a mucosal neoplasm. There also multiple diverticula of the descending colon and sigmoid colon which could also be a source of hemorrhage. No evidence of bowel ischemia. SMA and celiac trunk widely patent. 2. Small acute hematoma within the LEFT rectus muscle with a calculated volume of 30 cubic cm. Electronically Signed   By: Suzy Bouchard M.D.   On: 06/05/2017 20:26    ROS:  As stated above in the HPI otherwise negative.  Blood pressure (!) 108/49, pulse 73, temperature 97.8 F (36.6 C), temperature source Oral, resp. rate (!) 23, height 5\' 7"  (1.702 m), weight 86.6 kg (191 lb), SpO2 94 %.    PE: Gen:  NAD, Alert and Oriented HEENT:  [place]/AT, EOMI Neck: Supple, no LAD Lungs: CTA Bilaterally CV: RRR without M/G/R ABM: Soft, NTND, +BS Ext: No C/C/E  Assessment/Plan: 1) Hematochezia. 2) Diverticula. 3) Abnormal CT scan.   I believe the patient had a diverticular bleed.  He had a history of this bleeding 5 years ago, however, with the thickening in the descending colon, I will perform a flexible sigmoidoscopy for further evaluation.  Plan: 1) FFS tomorrow.  Lennell Shanks D 06/06/2017, 4:10 PM

## 2017-06-08 LAB — BASIC METABOLIC PANEL
Anion gap: 4 — ABNORMAL LOW (ref 5–15)
BUN: 14 mg/dL (ref 6–20)
CO2: 23 mmol/L (ref 22–32)
CREATININE: 0.9 mg/dL (ref 0.61–1.24)
Calcium: 7 mg/dL — ABNORMAL LOW (ref 8.9–10.3)
Chloride: 113 mmol/L — ABNORMAL HIGH (ref 101–111)
GFR calc Af Amer: >60 mL/min (ref >60)
Glucose, Bld: 101 mg/dL — ABNORMAL HIGH (ref 65–99)
POTASSIUM: 3.6 mmol/L (ref 3.5–5.1)
SODIUM: 140 mmol/L (ref 135–145)

## 2017-06-08 LAB — MAGNESIUM: MAGNESIUM: 1.8 mg/dL (ref 1.7–2.4)

## 2017-06-08 LAB — CBC
HCT: 20.5 % — ABNORMAL LOW (ref 39.0–52.0)
HCT: 24.4 % — ABNORMAL LOW (ref 39.0–52.0)
Hemoglobin: 7.1 g/dL — ABNORMAL LOW (ref 13.0–17.0)
Hemoglobin: 8.7 g/dL — ABNORMAL LOW (ref 13.0–17.0)
MCH: 32.9 pg (ref 26.0–34.0)
MCH: 33.5 pg (ref 26.0–34.0)
MCHC: 34.6 g/dL (ref 30.0–36.0)
MCHC: 35.7 g/dL (ref 30.0–36.0)
MCV: 94.9 fL (ref 78.0–100.0)
MCV: 93.8 fL (ref 78.0–100.0)
PLATELETS: 173 10*3/uL (ref 150–400)
PLATELETS: 202 10*3/uL (ref 150–400)
RBC: 2.16 MIL/uL — ABNORMAL LOW (ref 4.22–5.81)
RBC: 2.6 MIL/uL — ABNORMAL LOW (ref 4.22–5.81)
RDW: 17 % — ABNORMAL HIGH (ref 11.5–15.5)
RDW: 16.8 % — AB (ref 11.5–15.5)
WBC: 11.2 10*3/uL — ABNORMAL HIGH (ref 4.0–10.5)
WBC: 12.8 10*3/uL — ABNORMAL HIGH (ref 4.0–10.5)

## 2017-06-08 LAB — HEMOGLOBIN AND HEMATOCRIT, BLOOD
HEMATOCRIT: 23 % — AB (ref 39.0–52.0)
HEMOGLOBIN: 8.1 g/dL — AB (ref 13.0–17.0)

## 2017-06-08 LAB — PHOSPHORUS: Phosphorus: 2.2 mg/dL — ABNORMAL LOW (ref 2.5–4.6)

## 2017-06-08 LAB — PREPARE RBC (CROSSMATCH)

## 2017-06-08 MED ORDER — SODIUM CHLORIDE 0.9 % IV SOLN: Freq: Once | INTRAVENOUS | Status: DC

## 2017-06-08 NOTE — Progress Notes (Signed)
[facility] Progress Note Patient Name: Vincent Robinson DOB: 04-11-1923 MRN: [id number]   Date of Service  06/08/2017  HPI/Events of Note  Hgb down to 7.1, pressors restarted last night  eICU Interventions  Transfuse 1u prbc now and follow Hgb, pressor needs.      Intervention Category Intermediate Interventions: Bleeding - evaluation and treatment with blood products  Amaiya Scruton S. 06/08/2017, 6:04 AM

## 2017-06-08 NOTE — Progress Notes (Signed)
PULMONARY / CRITICAL CARE MEDICINE   Name: Vincent Robinson MRN: [id number] DOB: 03-22-1923    ADMISSION DATE:  06/05/2017 CONSULTATION DATE:  8/16  REFERRING MD:  Olevia Bowens   CHIEF COMPLAINT:  Acute Lower GI bleed w/ Hemorrhagic Shock   HISTORY OF PRESENT ILLNESS:   This is a 83 year old male admitted 8/15 w/ cc: hematochezia w/ associated dizziness. In ER hgb showed > 4 gm drop from last known hgb draw. A CT scan in abd showed diverticula in both the transverse and descending colon. He has a known h/o diverticular bleed.  Course: 8/15 admitted to SDU.GI medicine consulted.   8/16 to 8/17: developed hypotension. Received 2 units PRBCs.  Started on Neo gtt. PCCM asked to see. Hgb 8.7 (from 7.7 prior to transfusions).   PAST MEDICAL HISTORY :  He  has a past medical history of Asthma; COPD (chronic obstructive pulmonary disease) ([place]); Hyperlipidemia; Hypothyroidism; Skin cancer (2017); and Sleep apnea.  PAST SURGICAL HISTORY: He  has a past surgical history that includes Bypass axilla/brachial artery (1998); Debridment of decubitus ulcer (2002); Thoracic aorta stent (2009); colon bleed (2012); and bleeding ulcer (2016).  No Known Allergies  No current facility-administered medications on file prior to encounter.    Current Outpatient Prescriptions on File Prior to Encounter  Medication Sig  . albuterol (PROVENTIL HFA;VENTOLIN HFA) 108 (90 Base) MCG/ACT inhaler Inhale 1 puff into the lungs 2 (two) times daily.  Marland Kitchen aspirin (ASPIRIN 81) 81 MG chewable tablet Chew 81 mg by mouth daily.  Marland Kitchen dextromethorphan (DELSYM) 30 MG/5ML liquid Take 5 mLs (30 mg total) by mouth 2 (two) times daily.  . fluticasone (VERAMYST) 27.5 MCG/SPRAY nasal spray Place 1 spray into the nose daily.  . Fluticasone-Salmeterol (AIRDUO RESPICLICK 962/22) 979-89 MCG/ACT AEPB Inhale 1 puff into the lungs 2 (two) times daily.  Marland Kitchen levothyroxine (SYNTHROID, LEVOTHROID) 112 MCG tablet Take 112 mcg by mouth daily before breakfast.  .  montelukast (SINGULAIR) 10 MG tablet Take 1 tablet (10 mg total) by mouth at bedtime.  . simvastatin (ZOCOR) 20 MG tablet Take 10 mg by mouth at bedtime.  Marland Kitchen oxybutynin (DITROPAN) 5 MG tablet Take 5 mg by mouth every 8 (eight) hours as needed for bladder spasms.    FAMILY HISTORY:  His indicated that the status of his mother is unknown. He indicated that the status of his father is unknown. He indicated that the status of his sister is unknown. He indicated that the status of his brother is unknown.    SOCIAL HISTORY: He  reports that he quit smoking about 55 years ago. He smoked 0.50 packs per day. He has never used smokeless tobacco.  REVIEW OF SYSTEMS:   Bolds are positive  Constitutional: weight loss, gain, night sweats, Fevers, chills, fatigue .  HEENT: headaches, Sore throat, sneezing, nasal congestion, post nasal drip, Difficulty swallowing, Tooth/dental problems, visual complaints visual changes, ear ache CV:  chest pain, radiates:,Orthopnea, PND, swelling in lower extremities, dizziness on admission, palpitations, syncope.  GI  heartburn, indigestion, abdominal pain which he attributes to hunger at this point,  nausea, vomiting, diarrhea, change in bowel habits, loss of appetite, bloody stools.  Resp: cough, productive, hemoptysis, dyspnea, chest pain, pleuritic.  Skin: rash or itching or icterus GU: dysuria, change in color of urine, urgency or frequency. flank pain, hematuria  MS: joint pain or swelling. decreased range of motion  Psych: change in mood or affect. depression or anxiety.  Neuro: difficulty with speech, weakness, numbness, ataxia  SUBJECTIVE:  No more episodes of bleed.  Back on neo overnight. Transfused 1 unit PRBC as Hb was down  VITAL SIGNS: BP (!) 95/48   Pulse 64   Temp (!) 97.5 F (36.4 C) (Oral)   Resp (!) 25   Ht 5\' 7"  (1.702 m)   Wt 89.2 kg (196 lb 10.4 oz)   SpO2 91%   BMI 30.80 kg/m   HEMODYNAMICS:    VENTILATOR SETTINGS:    INTAKE /  OUTPUT: I/O last 3 completed shifts: In: 5961.5 [I.V.:3801.5; Blood:660; IV Piggyback:1500] Out: 2000 [Urine:2000]  PHYSICAL EXAMINATION: Gen:      No acute distress HEENT:  EOMI, sclera anicteric Neck:     No masses; no thyromegaly Lungs:    Clear to auscultation bilaterally; normal respiratory effort CV:         Regular rate and rhythm; systolic murmur Abd:      + bowel sounds; soft, non-tender; no palpable masses, no distension Ext:    No edema; adequate peripheral perfusion Skin:      Warm and dry; no rash Neuro: alert and oriented x 3 Psych: normal mood and affect   LABS:  BMET  Recent Labs Lab 06/05/17 1848 06/05/17 1901 06/06/17 0329 06/08/17 0519  NA 143 144 141 140  K 4.0 3.9 4.5 3.6  CL 114* 111 111 113*  CO2 23  --  23 23  BUN 22* 22* 25* 14  CREATININE 0.96 0.90 0.90 0.90  GLUCOSE 98 89 108* 101*    Electrolytes  Recent Labs Lab 06/05/17 1848 06/06/17 0329 06/08/17 0519  CALCIUM 7.6* 7.8* 7.0*  MG  --   --  1.8  PHOS  --   --  2.2*    CBC  Recent Labs Lab 06/07/17 0739 06/07/17 1948 06/08/17 0519  WBC 11.6* 11.3* 11.2*  HGB 8.7* 7.9* 7.1*  HCT 25.0* 22.6* 20.5*  PLT 183 170 173    Coag's  Recent Labs Lab 06/05/17 1848  INR 1.12    Sepsis Markers  Recent Labs Lab 06/05/17 1902  LATICACIDVEN 1.38    ABG No results for input(s): PHART, PCO2ART, PO2ART in the last 168 hours.  Liver Enzymes  Recent Labs Lab 06/05/17 1848 06/06/17 0329  AST 25 25  ALT 26 25  ALKPHOS 52 53  BILITOT 0.9 1.1  ALBUMIN 3.0* 3.2*    Cardiac Enzymes  Recent Labs Lab 06/07/17 0735 06/07/17 1423 06/07/17 1948  TROPONINI <0.03 <0.03 <0.03    Glucose  Recent Labs Lab 06/07/17 1616  GLUCAP 92    Imaging Nm Gi Blood Loss  Result Date: 06/07/2017 CLINICAL DATA:  Gastrointestinal bleeding. Active gastrointestinal bleeding. Last bloody bowel movement at 6 and. Thursday EXAM: NUCLEAR MEDICINE GASTROINTESTINAL BLEEDING SCAN  TECHNIQUE: Sequential abdominal images were obtained following intravenous administration of Tc-6m labeled red blood cells. RADIOPHARMACEUTICALS:  Twenty-three mCi Tc-40m in-vitro labeled red cells. COMPARISON:  06/05/2017 FINDINGS: Imaging of the abdomen was performed over a two hour interval. There is no accumulationof tagged red blood cells to localize active gastrointestinal bleeding. Physiologic uptake noted in the solid organs, blood pool, and GU tract. IMPRESSION: No scintigraphic evidence of active gastrointestinal bleeding. Electronically Signed   By: Suzy Bouchard M.D.   On: 06/07/2017 13:28     STUDIES:    CULTURES:   ANTIBIOTICS:   SIGNIFICANT EVENTS:   LINES/TUBES:   DISCUSSION: 83 year old with COPD, asthma, pulmonary fibrosis admitted with rectal bleed, likely diverticular  ASSESSMENT / PLAN:  H/o COPD (w/ mild  obstruction) as well as MIld mild ILD felt d/t prior pulmonary infection during WWII H/o OSA  H/o spont PTX (resolved) P:   Continue supplemental oxygen, Continue bronchodilators Continue CPAP at night Hypovolemic/hemorrhagic shock Heart murmur  First degree HB P:  Cont IVFs Titrate neo to off Check cortisol  At risk for AKI P:   Follow urine output and Cr  LGIB-->suspect diverticular  P:   PO diet Transfuse to keep Hb > 8 in effort to wean neo off.   LGIB w/ acute blood loss anemia  ->hemorrhagic shock  ->s/p 2 units blood P:  Follow CBC  Hypothyroidism  P:   Continue synthyroid  FAMILY  - Updates: Pt updated at bedside. - Inter-disciplinary family meet or Palliative Care meeting due by:  824  The patient is critically ill with multiple organ system failure and requires high complexity decision making for assessment and support, frequent evaluation and titration of therapies, advanced monitoring, review of radiographic studies and interpretation of complex data.   Critical Care Time devoted to patient care services, exclusive  of separately billable procedures, described in this note is 35 minutes.   Marshell Garfinkel MD [facility] Pager [phone] If no answer or after 3pm call: [phone] 06/08/2017, 12:10 PM

## 2017-06-08 NOTE — Progress Notes (Signed)
Patients Hgb down to 7.1 this am. 1 U PRBC was ordered. Blood bag issued to RN at 6:24 am. While RN was carrying out the necessary steps to start the transfusion, because of IT issues the computer froze. As a result the start of blood transfusion was delayed by  5 minutes(7:00am) past the 30 minute interval. 15 minutes post transfusion, no reaction noted. Blood bank made aware of the slight delay.

## 2017-06-08 NOTE — Progress Notes (Signed)
Subjective: Patient had a lot of rectal bleeding yesterday but none so far today. He denies having any abdominal pain, nausea or vomiting. He had a normal breakfast this morning.Findings on the colonoscopy noted where extensive left colon and transverse colon diverticula were noted with fresh blood in the left colon and rectum.  Objective: Vital signs in last 24 hours: Temp:  [96.7 F (35.9 C)-98.2 F (36.8 C)] 97.5 F (36.4 C) (08/18 1036) Pulse Rate:  [49-103] 66 (08/18 1200) Resp:  [16-28] 25 (08/18 1200) BP: (76-146)/(32-73) 88/42 (08/18 1200) SpO2:  [82 %-100 %] 91 % (08/18 1200) Weight:  [89.2 kg (196 lb 10.4 oz)] 89.2 kg (196 lb 10.4 oz) (08/18 0500) Last BM Date: 06/07/17  Intake/Output from previous day: 08/17 0701 - 08/18 0700 In: 2918 [I.V.:2888; Blood:30] Out: 1475 [Urine:1475] Intake/Output this shift: Total I/O In: 315 [Blood:315] Out: 200 [Urine:200]  General appearance: alert, cooperative, appears stated age, no distress and moderately obese Resp: clear to auscultation bilaterally Cardio: regular rate and rhythm, S1, S2 normal, no click, rub or gallop GI: soft, obese, non-tender; bowel sounds normal; no masses,  no organomegaly  Lab Results:  Recent Labs  06/07/17 0739 06/07/17 1948 06/08/17 0519  WBC 11.6* 11.3* 11.2*  HGB 8.7* 7.9* 7.1*  HCT 25.0* 22.6* 20.5*  PLT 183 170 173   BMET  Recent Labs  06/05/17 1848 06/05/17 1901 06/06/17 0329 06/08/17 0519  NA 143 144 141 140  K 4.0 3.9 4.5 3.6  CL 114* 111 111 113*  CO2 23  --  23 23  GLUCOSE 98 89 108* 101*  BUN 22* 22* 25* 14  CREATININE 0.96 0.90 0.90 0.90  CALCIUM 7.6*  --  7.8* 7.0*   LFT  Recent Labs  06/06/17 0329  PROT 5.9*  ALBUMIN 3.2*  AST 25  ALT 25  ALKPHOS 53  BILITOT 1.1   PT/INR  Recent Labs  06/05/17 1848  LABPROT 14.5  INR 1.12   Medications: I have reviewed the patient's current medications.  Assessment/Plan: 1) S/P Diverticular bleed with  posthemorrhagic anemia-currently stable; no further evidence of hematochezia since yesterday. Will monitor closely for now. 2) COPD/Sleep apnea. 3) Hypothyroidism.  4) History of multiple skin cancers.  LOS: 3 days   Deion Swift 06/08/2017, 12:33 PM

## 2017-06-08 NOTE — Progress Notes (Signed)
Pt states that he will self administer BIPAP QHS when ready for bed.  Pt to notify RT if any assistance is required.  RT to monitor and assess as needed.  

## 2017-06-09 LAB — PHOSPHORUS: Phosphorus: 2.2 mg/dL — ABNORMAL LOW (ref 2.5–4.6)

## 2017-06-09 LAB — BASIC METABOLIC PANEL
Anion gap: 3 — ABNORMAL LOW (ref 5–15)
BUN: 12 mg/dL (ref 6–20)
CHLORIDE: 115 mmol/L — AB (ref 101–111)
CO2: 23 mmol/L (ref 22–32)
CREATININE: 0.93 mg/dL (ref 0.61–1.24)
Calcium: 7 mg/dL — ABNORMAL LOW (ref 8.9–10.3)
GFR calc non Af Amer: >60 mL/min (ref >60)
Glucose, Bld: 100 mg/dL — ABNORMAL HIGH (ref 65–99)
Potassium: 3.7 mmol/L (ref 3.5–5.1)
SODIUM: 141 mmol/L (ref 135–145)

## 2017-06-09 LAB — CBC
HCT: 22.5 % — ABNORMAL LOW (ref 39.0–52.0)
HEMOGLOBIN: 7.9 g/dL — AB (ref 13.0–17.0)
MCH: 33.1 pg (ref 26.0–34.0)
MCHC: 35.1 g/dL (ref 30.0–36.0)
MCV: 94.1 fL (ref 78.0–100.0)
PLATELETS: 175 10*3/uL (ref 150–400)
RBC: 2.39 MIL/uL — ABNORMAL LOW (ref 4.22–5.81)
RDW: 16.9 % — ABNORMAL HIGH (ref 11.5–15.5)
WBC: 12.5 10*3/uL — ABNORMAL HIGH (ref 4.0–10.5)

## 2017-06-09 LAB — PREPARE RBC (CROSSMATCH)

## 2017-06-09 LAB — CORTISOL: CORTISOL PLASMA: 13.1 ug/dL

## 2017-06-09 LAB — MAGNESIUM: Magnesium: 1.5 mg/dL — ABNORMAL LOW (ref 1.7–2.4)

## 2017-06-09 LAB — HEMOGLOBIN AND HEMATOCRIT, BLOOD
HEMATOCRIT: 26.5 % — AB (ref 39.0–52.0)
HEMOGLOBIN: 9.3 g/dL — AB (ref 13.0–17.0)

## 2017-06-09 MED ORDER — SODIUM CHLORIDE 0.9 % IV SOLN: Freq: Once | INTRAVENOUS | Status: DC

## 2017-06-09 NOTE — Progress Notes (Signed)
PULMONARY / CRITICAL CARE MEDICINE   Name: Vincent Robinson MRN: [id number] DOB: 1923-03-04    ADMISSION DATE:  06/05/2017 CONSULTATION DATE:  8/16  REFERRING MD:  Olevia Bowens   CHIEF COMPLAINT:  Acute Lower GI bleed w/ Hemorrhagic Shock   HISTORY OF PRESENT ILLNESS:   This is a 83 year old male admitted 8/15 w/ cc: hematochezia w/ associated dizziness. In ER hgb showed > 4 gm drop from last known hgb draw. A CT scan in abd showed diverticula in both the transverse and descending colon. He has a known h/o diverticular bleed.  Course: 8/15 admitted to SDU.GI medicine consulted.   8/16 to 8/17: developed hypotension. Received 2 units PRBCs.  Started on Neo gtt. PCCM asked to see. Hgb 8.7 (from 7.7 prior to transfusions).   PAST MEDICAL HISTORY :  He  has a past medical history of Asthma; COPD (chronic obstructive pulmonary disease) ([place]); Hyperlipidemia; Hypothyroidism; Skin cancer (2017); and Sleep apnea.  PAST SURGICAL HISTORY: He  has a past surgical history that includes Bypass axilla/brachial artery (1998); Debridment of decubitus ulcer (2002); Thoracic aorta stent (2009); colon bleed (2012); and bleeding ulcer (2016).  No Known Allergies  No current facility-administered medications on file prior to encounter.    Current Outpatient Prescriptions on File Prior to Encounter  Medication Sig  . albuterol (PROVENTIL HFA;VENTOLIN HFA) 108 (90 Base) MCG/ACT inhaler Inhale 1 puff into the lungs 2 (two) times daily.  Marland Kitchen aspirin (ASPIRIN 81) 81 MG chewable tablet Chew 81 mg by mouth daily.  Marland Kitchen dextromethorphan (DELSYM) 30 MG/5ML liquid Take 5 mLs (30 mg total) by mouth 2 (two) times daily.  . fluticasone (VERAMYST) 27.5 MCG/SPRAY nasal spray Place 1 spray into the nose daily.  . Fluticasone-Salmeterol (AIRDUO RESPICLICK 614/43) 154-00 MCG/ACT AEPB Inhale 1 puff into the lungs 2 (two) times daily.  Marland Kitchen levothyroxine (SYNTHROID, LEVOTHROID) 112 MCG tablet Take 112 mcg by mouth daily before breakfast.  .  montelukast (SINGULAIR) 10 MG tablet Take 1 tablet (10 mg total) by mouth at bedtime.  . simvastatin (ZOCOR) 20 MG tablet Take 10 mg by mouth at bedtime.  Marland Kitchen oxybutynin (DITROPAN) 5 MG tablet Take 5 mg by mouth every 8 (eight) hours as needed for bladder spasms.    FAMILY HISTORY:  His indicated that the status of his mother is unknown. He indicated that the status of his father is unknown. He indicated that the status of his sister is unknown. He indicated that the status of his brother is unknown.    SOCIAL HISTORY: He  reports that he quit smoking about 55 years ago. He smoked 0.50 packs per day. He has never used smokeless tobacco.  REVIEW OF SYSTEMS:   Bolds are positive  Constitutional: weight loss, gain, night sweats, Fevers, chills, fatigue .  HEENT: headaches, Sore throat, sneezing, nasal congestion, post nasal drip, Difficulty swallowing, Tooth/dental problems, visual complaints visual changes, ear ache CV:  chest pain, radiates:,Orthopnea, PND, swelling in lower extremities, dizziness on admission, palpitations, syncope.  GI  heartburn, indigestion, abdominal pain which he attributes to hunger at this point,  nausea, vomiting, diarrhea, change in bowel habits, loss of appetite, bloody stools.  Resp: cough, productive, hemoptysis, dyspnea, chest pain, pleuritic.  Skin: rash or itching or icterus GU: dysuria, change in color of urine, urgency or frequency. flank pain, hematuria  MS: joint pain or swelling. decreased range of motion  Psych: change in mood or affect. depression or anxiety.  Neuro: difficulty with speech, weakness, numbness, ataxia  SUBJECTIVE:  Episode of melena yesterday Received 1 unit PRBC Neo is weaning down  VITAL SIGNS: BP (!) 98/56   Pulse 67   Temp 97.7 F (36.5 C) (Oral)   Resp (!) 21   Ht 5\' 7"  (1.702 m)   Wt 90.9 kg (200 lb 6.4 oz)   SpO2 92%   BMI 31.39 kg/m   HEMODYNAMICS:    VENTILATOR SETTINGS:    INTAKE / OUTPUT: I/O last 3  completed shifts: In: 7949 [P.O.:120; I.V.:7484; Blood:345] Out: 2575 [Urine:2575]  PHYSICAL EXAMINATION: Gen:      No acute distress HEENT:  EOMI, sclera anicteric Neck:     No masses; no thyromegaly Lungs:    Clear to auscultation bilaterally; normal respiratory effort CV:         Regular rate and rhythm; no murmurs Abd:      + bowel sounds; soft, non-tender; no palpable masses, no distension Ext:    No edema; adequate peripheral perfusion Skin:      Warm and dry; no rash Neuro: alert and oriented x 3 Psych: normal mood and affect   LABS:  BMET  Recent Labs Lab 06/06/17 0329 06/08/17 0519 06/09/17 0425  NA 141 140 141  K 4.5 3.6 3.7  CL 111 113* 115*  CO2 23 23 23   BUN 25* 14 12  CREATININE 0.90 0.90 0.93  GLUCOSE 108* 101* 100*    Electrolytes  Recent Labs Lab 06/06/17 0329 06/08/17 0519 06/09/17 0425  CALCIUM 7.8* 7.0* 7.0*  MG  --  1.8 1.5*  PHOS  --  2.2* 2.2*    CBC  Recent Labs Lab 06/08/17 0519 06/08/17 1235 06/08/17 1644 06/09/17 0425  WBC 11.2*  --  12.8* 12.5*  HGB 7.1* 8.1* 8.7* 7.9*  HCT 20.5* 23.0* 24.4* 22.5*  PLT 173  --  202 175    Coag's  Recent Labs Lab 06/05/17 1848  INR 1.12    Sepsis Markers  Recent Labs Lab 06/05/17 1902  LATICACIDVEN 1.38    ABG No results for input(s): PHART, PCO2ART, PO2ART in the last 168 hours.  Liver Enzymes  Recent Labs Lab 06/05/17 1848 06/06/17 0329  AST 25 25  ALT 26 25  ALKPHOS 52 53  BILITOT 0.9 1.1  ALBUMIN 3.0* 3.2*    Cardiac Enzymes  Recent Labs Lab 06/07/17 0735 06/07/17 1423 06/07/17 1948  TROPONINI <0.03 <0.03 <0.03    Glucose  Recent Labs Lab 06/07/17 1616  GLUCAP 92    Imaging No results found.   STUDIES:    CULTURES:   ANTIBIOTICS:   SIGNIFICANT EVENTS:   LINES/TUBES:   DISCUSSION: 83 year old with COPD, asthma, pulmonary fibrosis admitted with rectal bleed, likely diverticular  ASSESSMENT / PLAN:  H/o COPD (w/ mild  obstruction) as well as MIld mild ILD felt d/t prior pulmonary infection during [place] H/o OSA  H/o spont PTX (resolved) P:   Continue supplemental oxygen, Continue bronchodilators Continue bipap at night  Hypovolemic/hemorrhagic shock Heart murmur  First degree HB P:  Cont IVFs Titrate neo to off Check cortisol  At risk for AKI P:   Follow urine output and Cr  LGIB-->suspect diverticular  May have a slow on going bleed as Hb keeps drifting down P:   PO diet Follow H/H. Goals > 7 GI on board  LGIB w/ acute blood loss anemia  ->hemorrhagic shock  P:  Follow CBC  Hypothyroidism  P:   Continue synthyroid  FAMILY  - Updates: Pt updated at bedside. - Inter-disciplinary  family meet or Palliative Care meeting due by:  824  The patient is critically ill with multiple organ system failure and requires high complexity decision making for assessment and support, frequent evaluation and titration of therapies, advanced monitoring, review of radiographic studies and interpretation of complex data.   Critical Care Time devoted to patient care services, exclusive of separately billable procedures, described in this note is 35 minutes.   Marshell Garfinkel MD [facility] Pager [phone] If no answer or after 3pm call: [phone] 06/09/2017, 12:15 PM

## 2017-06-09 NOTE — Progress Notes (Signed)
Subjective: Since I last evaluated the patient, he has had some more hematochezia [he says it looks like old blood] this morning and was given 1 unit of PRBC's today. Her denies having any abdominal pain, nausea or vomiting. BP 98/44.  Objective: Vital signs in last 24 hours: Temp:  [96.9 F (36.1 C)-98.1 F (36.7 C)] 97.6 F (36.4 C) (08/19 0930) Pulse Rate:  [48-83] 80 (08/19 0930) Resp:  [13-29] 16 (08/19 0930) BP: (82-127)/(38-77) 124/63 (08/19 0930) SpO2:  [85 %-98 %] 90 % (08/19 0930) Weight:  [90.9 kg (200 lb 6.4 oz)] 90.9 kg (200 lb 6.4 oz) (08/19 0430) Last BM Date: 06/07/17  Intake/Output from previous day: 08/18 0701 - 08/19 0700 In: 6081.8 [P.O.:120; I.V.:5646.8; Blood:315] Out: 5397 [Urine:1450] Intake/Output this shift: Total I/O In: 482.5 [P.O.:200; I.V.:282.5] Out: 450 [Urine:450]  General appearance: alert Resp: clear to auscultation bilaterally Cardio: regular rate and rhythm, S1, S2 normal, no murmur, click, rub or gallop GI: soft, non-tender; bowel sounds normal; no masses,  no organomegaly  Lab Results:  Recent Labs  06/08/17 0519 06/08/17 1235 06/08/17 1644 06/09/17 0425  WBC 11.2*  --  12.8* 12.5*  HGB 7.1* 8.1* 8.7* 7.9*  HCT 20.5* 23.0* 24.4* 22.5*  PLT 173  --  202 175   BMET  Recent Labs  06/08/17 0519 06/09/17 0425  NA 140 141  K 3.6 3.7  CL 113* 115*  CO2 23 23  GLUCOSE 101* 100*  BUN 14 12  CREATININE 0.90 0.93  CALCIUM 7.0* 7.0*   Studies/Results: Nm Gi Blood Loss  Result Date: 06/07/2017 CLINICAL DATA:  Gastrointestinal bleeding. Active gastrointestinal bleeding. Last bloody bowel movement at 6 and. Thursday EXAM: NUCLEAR MEDICINE GASTROINTESTINAL BLEEDING SCAN TECHNIQUE: Sequential abdominal images were obtained following intravenous administration of Tc-32m labeled red blood cells. RADIOPHARMACEUTICALS:  Twenty-three mCi Tc-26m in-vitro labeled red cells. COMPARISON:  06/05/2017 FINDINGS: Imaging of the abdomen was  performed over a two hour interval. There is no accumulationof tagged red blood cells to localize active gastrointestinal bleeding. Physiologic uptake noted in the solid organs, blood pool, and GU tract. IMPRESSION: No scintigraphic evidence of active gastrointestinal bleeding. Electronically Signed   By: Suzy Bouchard M.D.   On: 06/07/2017 13:28   Medications: I have reviewed the patient's current medications.  Assessment/Plan: Diverticular bleed with acute post-hemorrhagic anemia-still hypotensive on pressors. Will watch for another day. Due to his age, I would be reluctant to get surgery involved but if he continues to bleed that might be our only options. His  diverticular disease is predominant in the transverse colon and descending colon with most of the blood noted in the left colon on a recent colonoscopy.      LOS: 4 days   Vincent Robinson 06/09/2017, 10:27 AM

## 2017-06-09 NOTE — Progress Notes (Signed)
[facility] Progress Note Patient Name: Vincent Robinson DOB: Jan 31, 1923 MRN: [id number]   Date of Service  06/09/2017  HPI/Events of Note    eICU Interventions  1u PRBC given     Intervention Category Intermediate Interventions: Bleeding - evaluation and treatment with blood products  Carman Essick S. 06/09/2017, 4:56 AM

## 2017-06-09 NOTE — Progress Notes (Signed)
Notified Dr. Vaughan Browner and Dr. Collene Mares of patient having a medium bloody stool.  The blood in the stool was fresh and not old blood.  Continue to monitor the patient closely.  Keeshawn Fakhouri Roselie Awkward RN

## 2017-06-10 ENCOUNTER — Encounter (HOSPITAL_COMMUNITY): Payer: Self-pay | Admitting: Gastroenterology

## 2017-06-10 LAB — TYPE AND SCREEN
ABO/RH(D): A POS
ABO/RH(D): A POS
ANTIBODY SCREEN: NEGATIVE
Antibody Screen: NEGATIVE
UNIT DIVISION: 0
UNIT DIVISION: 0
UNIT DIVISION: 0
Unit division: 0
Unit division: 0
Unit division: 0

## 2017-06-10 LAB — BPAM RBC
BLOOD PRODUCT EXPIRATION DATE: 201808292359
BLOOD PRODUCT EXPIRATION DATE: 201808312359
BLOOD PRODUCT EXPIRATION DATE: 201809142359
BLOOD PRODUCT EXPIRATION DATE: 201808312359
Blood Product Expiration Date: 201808292359
Blood Product Expiration Date: 201808312359
ISSUE DATE / TIME: 201808162107
ISSUE DATE / TIME: 201808162307
ISSUE DATE / TIME: 201808171707
ISSUE DATE / TIME: 201808180624
ISSUE DATE / TIME: 201808190904
ISSUE DATE / TIME: 201808190904
UNIT TYPE AND RH: 5100
UNIT TYPE AND RH: 6200
Unit Type and Rh: 6200
Unit Type and Rh: 6200
Unit Type and Rh: 6200
Unit Type and Rh: 6200

## 2017-06-10 LAB — BASIC METABOLIC PANEL
ANION GAP: 3 — AB (ref 5–15)
BUN: 10 mg/dL (ref 6–20)
CHLORIDE: 115 mmol/L — AB (ref 101–111)
CO2: 22 mmol/L (ref 22–32)
CREATININE: 0.83 mg/dL (ref 0.61–1.24)
Calcium: 7.2 mg/dL — ABNORMAL LOW (ref 8.9–10.3)
Glucose, Bld: 100 mg/dL — ABNORMAL HIGH (ref 65–99)
POTASSIUM: 3.2 mmol/L — AB (ref 3.5–5.1)
SODIUM: 140 mmol/L (ref 135–145)

## 2017-06-10 LAB — CBC
HCT: 24.2 % — ABNORMAL LOW (ref 39.0–52.0)
Hemoglobin: 8.4 g/dL — ABNORMAL LOW (ref 13.0–17.0)
MCH: 31.8 pg (ref 26.0–34.0)
MCHC: 34.7 g/dL (ref 30.0–36.0)
MCV: 91.7 fL (ref 78.0–100.0)
PLATELETS: 162 10*3/uL (ref 150–400)
RBC: 2.64 MIL/uL — ABNORMAL LOW (ref 4.22–5.81)
RDW: 18 % — AB (ref 11.5–15.5)
WBC: 9.5 10*3/uL (ref 4.0–10.5)

## 2017-06-10 MED ORDER — PREMIER PROTEIN SHAKE
11.0000 [oz_av] | ORAL | Status: DC
  Administered 2017-06-11 – 2017-06-13 (×3): 11 [oz_av] via ORAL
  Filled 2017-06-10 (×6): qty 325.31

## 2017-06-10 MED ORDER — POTASSIUM CHLORIDE CRYS ER 20 MEQ PO TBCR
40.0000 meq | EXTENDED_RELEASE_TABLET | Freq: Once | ORAL | Status: AC
  Administered 2017-06-10: 40 meq via ORAL
  Filled 2017-06-10: qty 2

## 2017-06-10 MED ORDER — SODIUM CHLORIDE 0.9 % IV SOLN
INTRAVENOUS | Status: DC
  Administered 2017-06-10: 12:00:00 via INTRAVENOUS

## 2017-06-10 MED ORDER — SODIUM CHLORIDE 0.9 % IV BOLUS (SEPSIS)
500.0000 mL | Freq: Once | INTRAVENOUS | Status: AC
  Administered 2017-06-10: 500 mL via INTRAVENOUS

## 2017-06-10 NOTE — Progress Notes (Signed)
Per central telemetry, pt appeared to be in A-fib, rate controlled. EKG performed. Per EKG pt is NSR, with 1st degree AV block. EKG placed in chart. Will continue to monitor.

## 2017-06-10 NOTE — Care Management Note (Signed)
Case Management Note  Patient Details  Name: Vincent Robinson MRN: [id number] Date of Birth: [birth year]  Subjective/Objective:  Continued gi bld and bldy stools                  Action/Plan: Date:  June 10, 2017 Chart reviewed for concurrent status and case management needs. Will continue to follow patient progress. Discharge Planning: following for needs Expected discharge date: [id number] Velva Harman, BSN, [place], [place]  Expected Discharge Date:   (unknown)               Expected Discharge Plan:  Home/Self Care  In-House Referral:     Discharge planning Services  CM Consult  Post Acute Care Choice:    Choice offered to:     DME Arranged:    DME Agency:     HH Arranged:    [place] Agency:     Status of Service:  In process, will continue to follow  If discussed at Long Length of Stay Meetings, dates discussed:    Additional Comments:  [name], RN 06/10/2017, 9:03 AM

## 2017-06-10 NOTE — Progress Notes (Signed)
PULMONARY / CRITICAL CARE MEDICINE   Name: Vincent Robinson MRN: [id number] DOB: 03-10-1923    ADMISSION DATE:  06/05/2017 CONSULTATION DATE:  8/16  REFERRING MD:  Olevia Bowens   CHIEF COMPLAINT:  Acute Lower GI bleed w/ Hemorrhagic Shock   BRIEF SUMMARY:  83 year old male admitted 8/15 w/ cc: hematochezia w/ associated dizziness. In ER Hgb showed > 4 gm drop from last known hgb draw. A CT scan in abd showed diverticula in both the transverse and descending colon. He has a known h/o diverticular bleed.  Recent admit for spontaneous pneumothorax (resolved as of 8/14 CXR).   SUBJECTIVE:  RN reports neosynephrine off since 8/19 afternoon.  Hgb decreased from 9.3 > 8.4.  Afebrile / soft bp but stable. Pt reports mild SOB, feels like the bleeding is slowing down.  None since yesterday 8/19.     VITAL SIGNS: BP (!) 87/43 (BP Location: Left Arm)   Pulse 62   Temp 98.4 F (36.9 C)   Resp 18   Ht 5\' 7"  (1.702 m)   Wt 200 lb 6.4 oz (90.9 kg)   SpO2 (!) 88%   BMI 31.39 kg/m   HEMODYNAMICS:    VENTILATOR SETTINGS:    INTAKE / OUTPUT: I/O last 3 completed shifts: In: 5027 [P.O.:520; I.V.:4626; Blood:310] Out: 2150 [Urine:2150]  PHYSICAL EXAMINATION: General: elderly male in NAD, lying in bed HEENT: MM pink/moist, no jvd, fair dentition  PSY: calm/appropriate  Neuro: AAOx4, speech clear, MAE CV: s1s2 rrr, no m/r/g PULM: even/non-labored, lungs bilaterally clear anterior, faint basilar crackles posterior lower  XA:JOIN, non-tender, bsx4 active  Extremities: warm/dry, trace LE edema  Skin: no rashes or lesions  LABS:  BMET  Recent Labs Lab 06/08/17 0519 06/09/17 0425 06/10/17 0519  NA 140 141 140  K 3.6 3.7 3.2*  CL 113* 115* 115*  CO2 23 23 22   BUN 14 12 10   CREATININE 0.90 0.93 0.83  GLUCOSE 101* 100* 100*    Electrolytes  Recent Labs Lab 06/08/17 0519 06/09/17 0425 06/10/17 0519  CALCIUM 7.0* 7.0* 7.2*  MG 1.8 1.5*  --   PHOS 2.2* 2.2*  --     CBC  Recent  Labs Lab 06/08/17 1644 06/09/17 0425 06/09/17 1401 06/10/17 0519  WBC 12.8* 12.5*  --  9.5  HGB 8.7* 7.9* 9.3* 8.4*  HCT 24.4* 22.5* 26.5* 24.2*  PLT 202 175  --  162    Coag's  Recent Labs Lab 06/05/17 1848  INR 1.12    Sepsis Markers  Recent Labs Lab 06/05/17 1902  LATICACIDVEN 1.38    ABG No results for input(s): PHART, PCO2ART, PO2ART in the last 168 hours.  Liver Enzymes  Recent Labs Lab 06/05/17 1848 06/06/17 0329  AST 25 25  ALT 26 25  ALKPHOS 52 53  BILITOT 0.9 1.1  ALBUMIN 3.0* 3.2*    Cardiac Enzymes  Recent Labs Lab 06/07/17 0735 06/07/17 1423 06/07/17 1948  TROPONINI <0.03 <0.03 <0.03    Glucose  Recent Labs Lab 06/07/17 1616  GLUCAP 92    Imaging No results found.   STUDIES:  8/15  CT ABD/Pelvis >> no clear explanation of GI bleeding, focus of thickening along the descending colon, multiple diverticula of the descenting colon, sigmoid colon, no evidence of bowel ischemia, SMA / celiac trunk widely patent, small acute hematoma within the left rectus muscle  8/17  Colonoscopy >> Diverticulosis in the descending colon and in the transverse colon. Diverticular bleed. Blood in the rectum, in the sigmoid  colon, in the descending colon, in the transverse colon and at the hepatic flexure. No specimens collected. 8/17  NM GI Blood Loss >> no scintigraphic evidence of active gastrointestinal bleeding  CULTURES:   ANTIBIOTICS:   SIGNIFICANT EVENTS: 8/15  Admitted to SDU. GI medicine consulted.   8/16  Developed hypotension, 2 units PRBC's, neo started.  PCCM consulted 8/19  Neosynephrine weaned off, melena x1   LINES/TUBES:   DISCUSSION: 83 year old with COPD, asthma, pulmonary fibrosis and recent spontaneous pneumothorax admitted 8/15 with rectal bleeding, suspected diverticular.  ASSESSMENT / PLAN:  H/o COPD - with mild obstruction  Mild ILD - prior exposures from [place] H/o OSA  H/o spont PTX - resolved P:   Pulmonary  hygiene - IS, mobilize  Continue QHS BiPAP  Delsym BID for cough  Continue flonase, Dulera, Singulair   Hypovolemic/hemorrhagic shock Heart murmur  First degree HB P:  ICU monitoring of hemodynamics Resume NS @ 50 ml/hr   At Risk for AKI Hypokalemia  P:   Trend BMP / urinary output Replace electrolytes as indicated > KCL 8/20  Avoid nephrotoxic agents, ensure adequate renal perfusion   LGIB - suspect diverticular, ? slow on going bleed as Hb keeps drifting down P:   Diet as tolerated  Transfuse for Hgb >7  GI following, appreciate input   LGIB w/ acute blood loss anemia / hemorrhagic shock  P:  Trend CBC  Transfuse per ICU guidelines  Resume gentle IVF's as above    Hypothyroidism  P:   Continue synthroid   FAMILY  - Updates: Patient updated 8/20 am on NP rounding on plan of care.   - Global:  tx back to [facility] as of 8/21.     Noe Gens, NP-C [facility] Pgr: [phone] or if no answer [phone] 06/10/2017, 10:21 AM

## 2017-06-10 NOTE — Progress Notes (Signed)
Nutrition Follow-up  DOCUMENTATION CODES:   Obesity unspecified  INTERVENTION:  - Will order Premier Protein once/day, this supplement provides 160 kcal and 30 grams of protein. - Continue to encourage PO intakes of meals and supplement.   NUTRITION DIAGNOSIS:   Increased nutrient needs (for protein) related to acute illness (GIB with possible ongoing, slow bleeding) as evidenced by  (need for transfusions and slowly dropping Hgb). -revised.  GOAL:   Patient will meet greater than or equal to 90% of their needs -met on average.   MONITOR:   PO intake, Supplement acceptance, Weight trends, Labs  ASSESSMENT:    83 y.o. male with medical history significant of asthma, COPD, hyperlipidemia, hypothyroidism, skin cancer, sleep apnea on BiPAP who was recently admitted and discharged from 05/20/2017 2 05/23/2017 due to right apical pneumothorax is now coming to the emergency department with complaints of rectal bleeding.  8/20 Diet advanced from NPO to Regular diet on 8/17 at [facility]. Pt has been eating 50-100% since diet advancement and denies any discomfort or difficulty with eating. Weight has been trending up since admission and is now +9 lb/4.3 kg since that time. Will trial Premier once/day per discussion RD had with pt during previous visit.   Medications reviewed; 112 mcg oral Synthroid/day, 40 mg IV Protonix/day, 40 mEq oral KCl x1 dose today. Labs reviewed; K: 3.2 mmol/L, Cl: 115 mmol/L, Ca: 7.2 mg/dL.  IVF: NS @ 50 mL/hr.    8/16 - Met with pt in room today. - Pt reports good appetite and oral intake PTA. - Per chart, pt weight is stable.  - Pt currently NPO for GI consult today; reports that he is hungry and would like to eat.  - He does not drink supplements at home.  - RD recommended Premier Protein; pt is willing to try this. - Recommended good lean protein with meals.   Nutrition-Focused physical exam completed. Findings are no fat depletion, no muscle depletion, and  no edema.    Diet Order:  Diet regular Room service appropriate? Yes; Fluid consistency: Thin  Skin:  Reviewed, no issues  Last BM:  8/17  Height:   Ht Readings from Last 1 Encounters:  06/05/17 '5\' 7"'  (1.702 m)    Weight:   Wt Readings from Last 1 Encounters:  06/10/17 200 lb 6.4 oz (90.9 kg)    Ideal Body Weight:  64.5 kg  BMI:  Body mass index is 31.39 kg/m.  Estimated Nutritional Needs:   Kcal:  1800-2100kcal/day   Protein:  87-104g/day   Fluid:  >1.8L/day   EDUCATION NEEDS:   Education needs addressed    Jarome Matin, MS, RD, LDN, CNSC Inpatient Clinical Dietitian Pager # [phone] After hours/weekend pager # [phone]

## 2017-06-10 NOTE — Progress Notes (Signed)
UNASSIGNED PATIENT Subjective: Since I last evaluated the patient, he has had more rectal bleeding, last night and again this afternoon. He denies having any abdominal pain, nausea or vomiting.  Objective: Vital signs in last 24 hours: Temp:  [97.4 F (36.3 C)-98.4 F (36.9 C)] 97.4 F (36.3 C) (08/20 1316) Pulse Rate:  [60-84] 64 (08/20 1200) Resp:  [17-29] 23 (08/20 1200) BP: (82-127)/(40-76) 95/44 (08/20 1200) SpO2:  [88 %-97 %] 91 % (08/20 1200) Weight:  [90.9 kg (200 lb 6.4 oz)] 90.9 kg (200 lb 6.4 oz) (08/20 0500) Last BM Date: 06/07/17  Intake/Output from previous day: 08/19 0701 - 08/20 0700 In: 1560 [P.O.:400; I.V.:850; Blood:310] Out: 2423 [Urine:1550] Intake/Output this shift: Total I/O In: 480 [P.O.:480] Out: 275 [Urine:275]  General appearance: alert, cooperative, appears stated age, fatigued, no distress and pale Resp: clear to auscultation bilaterally Cardio: regular rate and rhythm, S1, S2 normal, no murmur, click, rub or gallop GI: soft, non-tender; bowel sounds normal; no masses,  no organomegaly  Lab Results:  Recent Labs  06/08/17 1644 06/09/17 0425 06/09/17 1401 06/10/17 0519  WBC 12.8* 12.5*  --  9.5  HGB 8.7* 7.9* 9.3* 8.4*  HCT 24.4* 22.5* 26.5* 24.2*  PLT 202 175  --  162   BMET  Recent Labs  06/08/17 0519 06/09/17 0425 06/10/17 0519  NA 140 141 140  K 3.6 3.7 3.2*  CL 113* 115* 115*  CO2 23 23 22   GLUCOSE 101* 100* 100*  BUN 14 12 10   CREATININE 0.90 0.93 0.83  CALCIUM 7.0* 7.0* 7.2*   Studies/Results: No results found.  Medications: I have reviewed the patient's current medications.  Assessment/Plan: Diverticular bleed with post-hemorrhagic anermia-willc all CCS for a consult tomorrow as the patient continues to drop his hemoglobin. Continue supportive care for now.  LOS: 5 days   Jasan Doughtie 06/10/2017, 4:36 PM

## 2017-06-10 NOTE — Progress Notes (Signed)
06/10/2017- Pt placed on BIPAP for the night.  Pt using mask and circuit from home.  Tolerating well.

## 2017-06-11 ENCOUNTER — Inpatient Hospital Stay (HOSPITAL_COMMUNITY): Payer: Medicare Other

## 2017-06-11 LAB — BASIC METABOLIC PANEL
ANION GAP: 6 (ref 5–15)
BUN: 12 mg/dL (ref 6–20)
CALCIUM: 7.3 mg/dL — AB (ref 8.9–10.3)
CO2: 21 mmol/L — AB (ref 22–32)
Chloride: 114 mmol/L — ABNORMAL HIGH (ref 101–111)
Creatinine, Ser: 0.85 mg/dL (ref 0.61–1.24)
GFR calc Af Amer: >60 mL/min (ref >60)
GFR calc non Af Amer: >60 mL/min (ref >60)
GLUCOSE: 99 mg/dL (ref 65–99)
Potassium: 3.6 mmol/L (ref 3.5–5.1)
Sodium: 141 mmol/L (ref 135–145)

## 2017-06-11 LAB — CBC
HEMATOCRIT: 24.4 % — AB (ref 39.0–52.0)
HEMATOCRIT: 28.1 % — AB (ref 39.0–52.0)
HEMOGLOBIN: 8.3 g/dL — AB (ref 13.0–17.0)
Hemoglobin: 9.8 g/dL — ABNORMAL LOW (ref 13.0–17.0)
MCH: 32.3 pg (ref 26.0–34.0)
MCH: 32.9 pg (ref 26.0–34.0)
MCHC: 34 g/dL (ref 30.0–36.0)
MCHC: 34.9 g/dL (ref 30.0–36.0)
MCV: 94.9 fL (ref 78.0–100.0)
MCV: 94.3 fL (ref 78.0–100.0)
PLATELETS: 176 10*3/uL (ref 150–400)
Platelets: 226 10*3/uL (ref 150–400)
RBC: 2.57 MIL/uL — AB (ref 4.22–5.81)
RBC: 2.98 MIL/uL — ABNORMAL LOW (ref 4.22–5.81)
RDW: 18.4 % — ABNORMAL HIGH (ref 11.5–15.5)
RDW: 18.6 % — AB (ref 11.5–15.5)
WBC: 10.3 10*3/uL (ref 4.0–10.5)
WBC: 11 10*3/uL — ABNORMAL HIGH (ref 4.0–10.5)

## 2017-06-11 LAB — MAGNESIUM: Magnesium: 1.5 mg/dL — ABNORMAL LOW (ref 1.7–2.4)

## 2017-06-11 MED ORDER — FUROSEMIDE 10 MG/ML IJ SOLN
20.0000 mg | Freq: Once | INTRAMUSCULAR | Status: AC
  Administered 2017-06-11: 20 mg via INTRAVENOUS
  Filled 2017-06-11: qty 2

## 2017-06-11 MED ORDER — ORAL CARE MOUTH RINSE
15.0000 mL | Freq: Two times a day (BID) | OROMUCOSAL | Status: DC
  Administered 2017-06-12 – 2017-06-14 (×5): 15 mL via OROMUCOSAL

## 2017-06-11 MED ORDER — MAGNESIUM SULFATE 2 GM/50ML IV SOLN
2.0000 g | Freq: Once | INTRAVENOUS | Status: AC
  Administered 2017-06-11: 2 g via INTRAVENOUS
  Filled 2017-06-11: qty 50

## 2017-06-11 NOTE — Progress Notes (Signed)
Pt had a three beat run of PVCs, RN went in to assess pt and pt was asymptomatic. Per pt, "I may have touched my wires." RN will continue to monitor.

## 2017-06-11 NOTE — Progress Notes (Signed)
Subjective: No further bleeding since 3 PM yesterday.  Objective: Vital signs in last 24 hours: Temp:  [97.7 F (36.5 C)-99.2 F (37.3 C)] 97.7 F (36.5 C) (08/21 1200) Pulse Rate:  [64-92] 91 (08/21 1400) Resp:  [17-34] 32 (08/21 1400) BP: (106-149)/(42-71) 117/60 (08/21 1400) SpO2:  [86 %-97 %] 89 % (08/21 1400) Weight:  [93.4 kg (205 lb 14.6 oz)] 93.4 kg (205 lb 14.6 oz) (08/21 0100) Last BM Date: 06/10/17  Intake/Output from previous day: 08/20 0701 - 08/21 0700 In: 1608.3 [P.O.:720; I.V.:888.3] Out: 1225 [Urine:1225] Intake/Output this shift: Total I/O In: 370 [P.O.:320; IV Piggyback:50] Out: 2425 [Urine:2425]  General appearance: alert and no distress GI: soft, non-tender; bowel sounds normal; no masses,  no organomegaly  Lab Results:  Recent Labs  06/10/17 0519 06/11/17 0326 06/11/17 1512  WBC 9.5 10.3 11.0*  HGB 8.4* 8.3* 9.8*  HCT 24.2* 24.4* 28.1*  PLT 162 176 226   BMET  Recent Labs  06/09/17 0425 06/10/17 0519 06/11/17 0326  NA 141 140 141  K 3.7 3.2* 3.6  CL 115* 115* 114*  CO2 23 22 21*  GLUCOSE 100* 100* 99  BUN 12 10 12   CREATININE 0.93 0.83 0.85  CALCIUM 7.0* 7.2* 7.3*   LFT No results for input(s): PROT, ALBUMIN, AST, ALT, ALKPHOS, BILITOT, BILIDIR, IBILI in the last 72 hours. PT/INR No results for input(s): LABPROT, INR in the last 72 hours. Hepatitis Panel No results for input(s): HEPBSAG, HCVAB, HEPAIGM, HEPBIGM in the last 72 hours. C-Diff No results for input(s): CDIFFTOX in the last 72 hours. Fecal Lactopherrin No results for input(s): FECLLACTOFRN in the last 72 hours.  Studies/Results: Dg Chest Port 1 View  Result Date: 06/11/2017 CLINICAL DATA:  Shortness of breath . EXAM: PORTABLE CHEST 1 VIEW COMPARISON:  06/04/2017 . FINDINGS: Prior CABG. Cardiomegaly with pulmonary vascular prominence and basilar interstitial prominence. Small bilateral pleural effusions. Findings consist with mild CHF. No pneumothorax . IMPRESSION:  Prior CABG. Congestive heart failure with mild basilar interstitial edema and small bilateral pleural effusions . Electronically Signed   By: Marcello Moores  Register   On: 06/11/2017 07:25    Medications:  Scheduled: . dextromethorphan  30 mg Oral BID  . fluticasone  1 spray Each Nare Daily  . levothyroxine  112 mcg Oral QAC breakfast  . mometasone-formoterol  2 puff Inhalation BID  . montelukast  10 mg Oral QHS  . pantoprazole (PROTONIX) IV  40 mg Intravenous Q24H  . protein supplement shake  11 oz Oral Q24H  . simvastatin  10 mg Oral QHS   Continuous:   Assessment/Plan: 1) Diverticular bleed. 2) Anemia.   HGB is stable and he does not have any further bleeding at this time.  If he remains stable, he may be discharged home in the next 24 -36 hours.  The problem is that rebleeding is unpredictable.  Plan: 1) Follow HGB. 2) Transfuse as necessary.  LOS: 6 days   Vincent Robinson D 06/11/2017, 3:52 PM

## 2017-06-11 NOTE — Progress Notes (Signed)
[facility] Progress Note  Patient: Vincent Robinson TIR:[id number]   PCP: Katherina Mires, MD DOB: 13-Oct-1923   DOA: 06/05/2017   DOS: 06/11/2017   Date of Service: the patient was seen and examined on 06/11/2017  Subjective: no further bleeding reported. Continues to have shortness of breath. Complains about having an overactive bladder. No diarrhea no constipation. Tolerating oral diet. No fever no chills. No chest pain.  Brief hospital course: Pt. with PMH of COPD,OSA on BiPAP, hypothyroidism, HLD, asthma; admitted on 06/05/2017, presented with complaint of BRBPR, was found to have diverticular bleed. Patient was initially admitted on the stepdown but then developed hemorrhagic shock requiring transfer to ICU and pressors. S/P 4 PRBC transfusion. Negative nuclear bleeding scan. Hemoglobin now stable. Currently further plan is continue supportive measures.  Assessment and Plan: 1. Acute diverticular GI bleed Hemorrhagic shock. S/P 4 PRBC. GI consulted. Negative nuclear medicine bleeding scan. No active bleeding since last 24-hour. Hemoglobin relatively stable as well. Patient was aggressively hydrated with 11 L positive. Currently plan is to monitor conservatively since his hemoglobin has remained stable and there is no further active bleeding. Appreciate GI input. Not on any pressors since 06/09/2017, appreciate CCM management in managing this patient's shock.  2. Acute on chronic diastolic CHF. Acute hypoxic respiratory failure. Obstructive sleep apnea. COPD and asthma.  Patient received aggressive IV hydration and now appears to bevolume overloaded. Will give 20 mg IV Lasix 1 to see how the patient is responding to diuretics. Blood pressure still relatively soft to provide aggressive diuresis. Continue to monitor in the step down unit. Patient uses BiPAP at home, will continue here. Continue Delsym twice a day for cough. Continue Flonase dulera and Singulair. Provide incentive  spirometry. Physical therapy will be consulted. Patient recently was hospitalized for pneumothorax which appears to have resolved at present.  3. Hypothyroidism. Continue Synthroid.  4. Overactive bladder. Patient uses when necessary oxybutynin which have been continued.  Diet: regular diet DVT Prophylaxis: mechanical compression device  Advance goals of care discussion: full code  Family Communication: nofamily was present at bedside, at the time of interview.  Disposition:  Discharge to be determined.  Consultants: CCM, gastroenterology Procedures: PRBC, NM bleeding scan  Antibiotics: Anti-infectives    None       Objective: Physical Exam: Vitals:   06/11/17 1200 06/11/17 1400 06/11/17 1600 06/11/17 1800  BP: (!) 149/70 117/60 112/60   Pulse: 74 91 79   Resp: 20 (!) 32 (!) 24   Temp: 97.7 F (36.5 C)  98.6 F (37 C)   TempSrc: Oral  Axillary   SpO2: 90% (!) 89% 96% 96%  Weight:      Height:        Intake/Output Summary (Last 24 hours) at 06/11/17 1854 Last data filed at 06/11/17 1750  Gross per 24 hour  Intake             1270 ml  Output             5275 ml  Net            -4005 ml   Filed Weights   06/09/17 0430 06/10/17 0500 06/11/17 0100  Weight: 90.9 kg (200 lb 6.4 oz) 90.9 kg (200 lb 6.4 oz) 93.4 kg (205 lb 14.6 oz)   General: Alert, Awake and Oriented to Time, Place and Person. Appear in moderate distress, affect appropriate Eyes: PERRL, Conjunctiva normal ENT: Oral Mucosa clear moist. Neck: difficult to assess JVD, no Abnormal Mass  Or lumps Cardiovascular: S1 and S2 Present, no Murmur, Peripheral Pulses Present Respiratory: increased respiratory effort, Bilateral Air entry equal and Decreased, no use of accessory muscle, bilateral Crackles, no wheezes Abdomen: Bowel Sound present, Soft and no tenderness, no hernia Skin: no redness, no Rash, no induration Extremities: bilateral Pedal edema, no calf tenderness Neurologic: Grossly no focal neuro  deficit. Bilaterally Equal motor strength  Data Reviewed: CBC:  Recent Labs Lab 06/08/17 1644 06/09/17 0425 06/09/17 1401 06/10/17 0519 06/11/17 0326 06/11/17 1512  WBC 12.8* 12.5*  --  9.5 10.3 11.0*  HGB 8.7* 7.9* 9.3* 8.4* 8.3* 9.8*  HCT 24.4* 22.5* 26.5* 24.2* 24.4* 28.1*  MCV 93.8 94.1  --  91.7 94.9 94.3  PLT 202 175  --  162 176 683   Basic Metabolic Panel:  Recent Labs Lab 06/06/17 0329 06/08/17 0519 06/09/17 0425 06/10/17 0519 06/11/17 0326  NA 141 140 141 140 141  K 4.5 3.6 3.7 3.2* 3.6  CL 111 113* 115* 115* 114*  CO2 23 23 23 22  21*  GLUCOSE 108* 101* 100* 100* 99  BUN 25* 14 12 10 12   CREATININE 0.90 0.90 0.93 0.83 0.85  CALCIUM 7.8* 7.0* 7.0* 7.2* 7.3*  MG  --  1.8 1.5*  --  1.5*  PHOS  --  2.2* 2.2*  --   --     Liver Function Tests:  Recent Labs Lab 06/05/17 1848 06/06/17 0329  AST 25 25  ALT 26 25  ALKPHOS 52 53  BILITOT 0.9 1.1  PROT 5.7* 5.9*  ALBUMIN 3.0* 3.2*   No results for input(s): LIPASE, AMYLASE in the last 168 hours. No results for input(s): AMMONIA in the last 168 hours. Coagulation Profile:  Recent Labs Lab 06/05/17 1848  INR 1.12   Cardiac Enzymes:  Recent Labs Lab 06/07/17 0735 06/07/17 1423 06/07/17 1948  TROPONINI <0.03 <0.03 <0.03   BNP (last 3 results) No results for input(s): PROBNP in the last 8760 hours. CBG:  Recent Labs Lab 06/07/17 1616  GLUCAP 92   Studies: Dg Chest Port 1 View  Result Date: 06/11/2017 CLINICAL DATA:  Shortness of breath . EXAM: PORTABLE CHEST 1 VIEW COMPARISON:  06/04/2017 . FINDINGS: Prior CABG. Cardiomegaly with pulmonary vascular prominence and basilar interstitial prominence. Small bilateral pleural effusions. Findings consist with mild CHF. No pneumothorax . IMPRESSION: Prior CABG. Congestive heart failure with mild basilar interstitial edema and small bilateral pleural effusions . Electronically Signed   By: Marcello Moores  Register   On: 06/11/2017 07:25    Scheduled  Meds: . dextromethorphan  30 mg Oral BID  . fluticasone  1 spray Each Nare Daily  . levothyroxine  112 mcg Oral QAC breakfast  . mometasone-formoterol  2 puff Inhalation BID  . montelukast  10 mg Oral QHS  . pantoprazole (PROTONIX) IV  40 mg Intravenous Q24H  . protein supplement shake  11 oz Oral Q24H  . simvastatin  10 mg Oral QHS   Continuous Infusions: PRN Meds: albuterol, oxybutynin  Time spent: CRITICAL CARE Performed by: Berle Mull   Total critical care time: 40 minutes  Critical care time was exclusive of separately billable procedures and treating other patients.  Critical care was necessary to treat or prevent imminent or life-threatening deterioration.  Critical care was time spent personally by me on the following activities: development of treatment plan with patient and/or surrogate as well as nursing, discussions with consultants, evaluation of patient's response to treatment, examination of patient, obtaining history from patient or surrogate,  ordering and performing treatments and interventions, ordering and review of laboratory studies, ordering and review of radiographic studies, pulse oximetry and re-evaluation of patient's condition.   Author: Berle Mull, MD [facility] Pager: [phone] 06/11/2017 6:54 PM  If 7PM-7AM, please contact night-coverage at [email or web address], password [facility]

## 2017-06-12 DIAGNOSIS — E039 Hypothyroidism, unspecified: Secondary | ICD-10-CM

## 2017-06-12 DIAGNOSIS — G4733 Obstructive sleep apnea (adult) (pediatric): Secondary | ICD-10-CM

## 2017-06-12 LAB — CBC WITH DIFFERENTIAL/PLATELET
BASOS ABS: 0 10*3/uL (ref 0.0–0.1)
BASOS PCT: 0 %
Eosinophils Absolute: 0.5 10*3/uL (ref 0.0–0.7)
Eosinophils Relative: 4 %
HEMATOCRIT: 27.1 % — AB (ref 39.0–52.0)
Hemoglobin: 9.3 g/dL — ABNORMAL LOW (ref 13.0–17.0)
LYMPHS PCT: 21 %
Lymphs Abs: 2.4 10*3/uL (ref 0.7–4.0)
MCH: 32.5 pg (ref 26.0–34.0)
MCHC: 34.3 g/dL (ref 30.0–36.0)
MCV: 94.8 fL (ref 78.0–100.0)
MONO ABS: 1 10*3/uL (ref 0.1–1.0)
Monocytes Relative: 9 %
NEUTROS ABS: 7.5 10*3/uL (ref 1.7–7.7)
NEUTROS PCT: 66 %
PLATELETS: 214 10*3/uL (ref 150–400)
RBC: 2.86 MIL/uL — AB (ref 4.22–5.81)
RDW: 18.3 % — AB (ref 11.5–15.5)
WBC: 11.4 10*3/uL — AB (ref 4.0–10.5)

## 2017-06-12 LAB — COMPREHENSIVE METABOLIC PANEL
ALBUMIN: 2.4 g/dL — AB (ref 3.5–5.0)
ALT: 14 U/L — AB (ref 17–63)
AST: 19 U/L (ref 15–41)
Alkaline Phosphatase: 59 U/L (ref 38–126)
Anion gap: 5 (ref 5–15)
BILIRUBIN TOTAL: 1 mg/dL (ref 0.3–1.2)
BUN: 19 mg/dL (ref 6–20)
CHLORIDE: 105 mmol/L (ref 101–111)
CO2: 28 mmol/L (ref 22–32)
CREATININE: 0.83 mg/dL (ref 0.61–1.24)
Calcium: 7.6 mg/dL — ABNORMAL LOW (ref 8.9–10.3)
GFR calc Af Amer: >60 mL/min (ref >60)
GFR calc non Af Amer: >60 mL/min (ref >60)
GLUCOSE: 112 mg/dL — AB (ref 65–99)
POTASSIUM: 3.2 mmol/L — AB (ref 3.5–5.1)
Sodium: 138 mmol/L (ref 135–145)
Total Protein: 5 g/dL — ABNORMAL LOW (ref 6.5–8.1)

## 2017-06-12 LAB — PROTIME-INR
INR: 1.13
Prothrombin Time: 14.6 seconds (ref 11.4–15.2)

## 2017-06-12 LAB — MAGNESIUM: Magnesium: 1.6 mg/dL — ABNORMAL LOW (ref 1.7–2.4)

## 2017-06-12 MED ORDER — POTASSIUM CHLORIDE CRYS ER 20 MEQ PO TBCR
40.0000 meq | EXTENDED_RELEASE_TABLET | Freq: Two times a day (BID) | ORAL | Status: AC
  Administered 2017-06-12 (×2): 40 meq via ORAL
  Filled 2017-06-12 (×2): qty 2

## 2017-06-12 MED ORDER — FUROSEMIDE 10 MG/ML IJ SOLN
20.0000 mg | Freq: Once | INTRAMUSCULAR | Status: AC
Start: 2017-06-12 — End: 2017-06-12
  Administered 2017-06-12: 20 mg via INTRAVENOUS
  Filled 2017-06-12: qty 2

## 2017-06-12 MED ORDER — PANTOPRAZOLE SODIUM 40 MG PO TBEC
40.0000 mg | DELAYED_RELEASE_TABLET | Freq: Every day | ORAL | Status: DC
  Administered 2017-06-12 – 2017-06-13 (×2): 40 mg via ORAL
  Filled 2017-06-12 (×2): qty 1

## 2017-06-12 NOTE — Evaluation (Signed)
Physical Therapy Evaluation Patient Details Name: Vincent Robinson MRN: [id number] DOB: [birth year] Today's Date: 06/12/2017   History of Present Illness  Pt. with PMH of COPD,OSA on BiPAP, hypothyroidism, HLD, asthma; admitted on 06/05/2017, presented with complaint of BRBPR, was found to have diverticular bleed. Patient was initially admitted on the stepdown but then developed hemorrhagic shock requiring transfer to ICU and pressors. S/P 4 PRBC transfusion. Negative nuclear bleeding scan. Hemoglobin now stable.  Clinical Impression    Pt from home with his wife. Need to be independent, feel he will progress fairly well. Today he was a little SOB, at bedside on 2 L O2 was at 89-90% , so increased to 3L with ambulation.  Short ambulation distance of 60 feet no AD no LOB, began to feel a little lightheaded, when we returned to bedside BP was at 92/50. It returned to 109/71 after sitting for about 2 minutes and pursed lip breathing, O2 at 94% with 3 L 02 with walking. Returned pt to 2L and pt returned to 90% at bedside. Feel he will benefit from continued therapy to assure he can have increase activity tolerance safely.    Recommend nursing talk patient for short walks throughout the day. Recommend HHPT to f/u with pt upon DC as well. We will continue to follow while here in acute care.  Follow Up Recommendations Home health PT    Equipment Recommendations  None recommended by PT    Recommendations for Other Services       Precautions / Restrictions Precautions Precautions: Fall Precaution Comments: monitor O2 sats  Restrictions Weight Bearing Restrictions: No      Mobility  Bed Mobility Overal bed mobility: Independent                Transfers Overall transfer level: Independent                  Ambulation/Gait Ambulation/Gait assistance: Min guard Ambulation Distance (Feet): 60 Feet (limtied distance due to SOB and some light headed. ) Assistive device: None Gait  Pattern/deviations: Step-through pattern     General Gait Details: pt ambulated on 3 L O 2, wanted to go faster, educated he needed to stop and slow and do small bursted and at that time her realized he was getting light headed and upon sitting on bed, BP was lower.   Stairs            Wheelchair Mobility    Modified Rankin (Stroke Patients Only)       Balance Overall balance assessment: No apparent balance deficits (not formally assessed)   Sitting balance-Leahy Scale: Good     Standing balance support: During functional activity Standing balance-Leahy Scale: Good                               Pertinent Vitals/Pain Pain Assessment: No/denies pain    Home Living Family/patient expects to be discharged to:: Private residence Living Arrangements: Spouse/significant other Available Help at Discharge: Family Type of Home: House Home Access: Stairs to enter   Technical brewer of Steps: 1-2 Home Layout: One level Home Equipment: Cane - single point;Wheelchair - manual      Prior Function Level of Independence: Independent         Comments: drives, fixes his house, unpacking from his move from [place]. Likes to stay busy and active.      Hand Dominance        Extremity/Trunk Assessment  Lower Extremity Assessment Lower Extremity Assessment: Generalized weakness       Communication   Communication: No difficulties  Cognition Arousal/Alertness: Awake/alert Behavior During Therapy: WFL for tasks assessed/performed Overall Cognitive Status: Within Functional Limits for tasks assessed                                        General Comments      Exercises Other Exercises Other Exercises: educated with small activities, some energy conservation, and pursed lip breathing exercises as well    Assessment/Plan    PT Assessment Patient needs continued PT services  PT Problem List Decreased mobility;Decreased  activity tolerance       PT Treatment Interventions Gait training;Functional mobility training;Therapeutic activities;Therapeutic exercise;Patient/family education    PT Goals (Current goals can be found in the Care Plan section)  Acute Rehab PT Goals Patient Stated Goal: I want to go home when I am ready  PT Goal Formulation: With patient Time For Goal Achievement: 06/26/17 Potential to Achieve Goals: Good    Frequency Min 3X/week   Barriers to discharge        Co-evaluation               AM-PAC PT "6 Clicks" Daily Activity  Outcome Measure Difficulty turning over in bed (including adjusting bedclothes, sheets and blankets)?: None Difficulty moving from lying on back to sitting on the side of the bed? : None Difficulty sitting down on and standing up from a chair with arms (e.g., wheelchair, bedside commode, etc,.)?: None Help needed moving to and from a bed to chair (including a wheelchair)?: None Help needed walking in hospital room?: A Little Help needed climbing 3-5 steps with a railing? : A Little 6 Click Score: 22    End of Session Equipment Utilized During Treatment: Gait belt;Oxygen Activity Tolerance: Patient tolerated treatment well Patient left: with call bell/phone within reach (in bathroom and nurse cam in to be by his side ) Nurse Communication: Mobility status PT Visit Diagnosis: Difficulty in walking, not elsewhere classified (R26.2)    Time: 1710-1748 PT Time Calculation (min) (ACUTE ONLY): 38 min   Charges:   PT Evaluation $PT Eval Low Complexity: 1 Low PT Treatments $Gait Training: 8-22 mins $Therapeutic Activity: 8-22 mins   PT G Codes:        Clide Dales, PT Pager: [phone] 06/12/2017   Claudio Mondry, Gatha Mayer 06/12/2017, 9:07 PM

## 2017-06-12 NOTE — Progress Notes (Signed)
UNASSIGNED PATIENT Subjective: Since I last evaluated the patient, he seems to be doing much better. He denies having any abdominal pain nausea vomiting. Has had no further GI bleeding. He is tolerating a regular diet well. He is anxious to return home as soon as possible. He is questioning with the should alter his diet in any way.  Objective: Vital signs in last 24 hours: Temp:  [97.6 F (36.4 C)-97.8 F (36.6 C)] 97.7 F (36.5 C) (08/22 1400) Pulse Rate:  [65-98] 98 (08/22 1400) Resp:  [16-30] 22 (08/22 1400) BP: (100-141)/(55-88) 120/60 (08/22 1400) SpO2:  [90 %-100 %] 92 % (08/22 1400) Weight:  [87.4 kg (192 lb 10.9 oz)] 87.4 kg (192 lb 10.9 oz) (08/22 0100) Last BM Date: 06/10/17  Intake/Output from previous day: 08/21 0701 - 08/22 0700 In: 570 [P.O.:520; IV Piggyback:50] Out: 6075 [Urine:6075] Intake/Output this shift: Total I/O In: 480 [P.O.:480] Out: 1200 [Urine:1200]  General appearance: alert, cooperative, appears stated age, no distress and moderately obese Resp: clear to auscultation bilaterally Cardio: regular rate and rhythm, S1, S2 normal, no murmur, click, rub or gallop GI: soft, non-tender; bowel sounds normal; no masses,  no organomegaly  Lab Results:  Recent Labs  06/11/17 0326 06/11/17 1512 06/12/17 0307  WBC 10.3 11.0* 11.4*  HGB 8.3* 9.8* 9.3*  HCT 24.4* 28.1* 27.1*  PLT 176 226 214   BMET  Recent Labs  06/10/17 0519 06/11/17 0326 06/12/17 0307  NA 140 141 138  K 3.2* 3.6 3.2*  CL 115* 114* 105  CO2 22 21* 28  GLUCOSE 100* 99 112*  BUN 10 12 19   CREATININE 0.83 0.85 0.83  CALCIUM 7.2* 7.3* 7.6*   LFT  Recent Labs  06/12/17 0307  PROT 5.0*  ALBUMIN 2.4*  AST 19  ALT 14*  ALKPHOS 59  BILITOT 1.0   PT/INR  Recent Labs  06/12/17 0307  LABPROT 14.6  INR 1.13   Studies/Results: Dg Chest Port 1 View  Result Date: 06/11/2017 CLINICAL DATA:  Shortness of breath . EXAM: PORTABLE CHEST 1 VIEW COMPARISON:  06/04/2017 .  FINDINGS: Prior CABG. Cardiomegaly with pulmonary vascular prominence and basilar interstitial prominence. Small bilateral pleural effusions. Findings consist with mild CHF. No pneumothorax . IMPRESSION: Prior CABG. Congestive heart failure with mild basilar interstitial edema and small bilateral pleural effusions . Electronically Signed   By: Marcello Moores  Register   On: 06/11/2017 07:25   Medications: I have reviewed the patient's current medications.  Assessment/Plan: 1) Iron deficiency anemia after GI bleed secondary to diverticulosis, hemorrhagic shock requiring pressors, and 4 units of PRBC's.Currently stable off pressors. Patient has been advised to follow a high-fiber diet with liberal fluid intake and maintain bowel regularity. He should take Colace 100 mg 2 by mouth twice a day if constipation is an issue. 2) COPD/Asthma/OSA/Acute hypoxic respiratory failure on BiPAP daily at bedtime for sleep; improved with diuresis on Lasix.  LOS: 7 days   Vincent Robinson 06/12/2017, 6:41 PM

## 2017-06-12 NOTE — Progress Notes (Addendum)
[facility] Progress Note  Patient: Vincent Robinson NWG:[id number]   PCP: Katherina Mires, MD DOB: Jun 04, 1923   DOA: 06/05/2017   DOS: 06/12/2017   Date of Service: the patient was seen and examined on 06/12/2017  Subjective: Breathing improving, no chest pain, no further bleeding  Brief hospital course: Pt. with PMH of COPD,OSA on BiPAP, hypothyroidism, HLD, asthma; admitted on 06/05/2017, presented with complaint of BRBPR, was found to have diverticular bleed. Patient was initially admitted on the stepdown but then developed hemorrhagic shock requiring transfer to ICU and pressors. S/P 4 PRBC transfusion. Negative nuclear bleeding scan. Hemoglobin now stable. Then developed respiratory distress due to pulmonary edema/1 overload, improving with diuresis  Assessment and Plan: 1. Acute diverticular GI bleed/Hemorrhagic shock. -Resolved, required pressor support in ICU -S/P 4 PRBC. -Followed by GI, had a nuclear medicine scan which is negative no further bleeding for about 48 hours now and hemoglobin is relatively stable - monitor CBC  2. Acute hypoxic respiratory failure -Secondary to acute on chronic diastolic CHF and obstructive sleep apnea  -This time primarily related to pulmonary edema -Improving with diuresis still 4.7 L net positive -We'll diurese with IV Lasix again today -Monitor I/O -On BiPAP daily at bedtime for sleep apnea at baseline  3. COPD/asthma. -Stable, on 2 L O2 would baseline  -Continue daily at bedtime BiPAP for sleep apnea  -Continue Flonase, 2I and Singulair  -Physical therapy evaluation and early ambulation   4. Hypothyroidism. Continue Synthroid.  5. Overactive bladder. Patient uses when necessary oxybutynin which have been continued.  DVT Prophylaxis: mechanical compression device  Advance goals of care discussion: full code  Family Communication: nofamily was present at bedside, at the time of interview.  Disposition:  transfer out of stepdown to  telemetry, back to assisted living in 1-2 days if stable   Consultants: CCM, gastroenterology Procedures: PRBC, NM bleeding scan  Antibiotics: Anti-infectives    None       Objective: Physical Exam: Vitals:   06/12/17 0500 06/12/17 0600 06/12/17 0800 06/12/17 0948  BP:  (!) 114/56 120/88   Pulse: 71 74 98   Resp: 16 17 (!) 30   Temp:   97.7 F (36.5 C)   TempSrc:      SpO2: 100% 100% 90% 94%  Weight:      Height:        Intake/Output Summary (Last 24 hours) at 06/12/17 1402 Last data filed at 06/12/17 0900  Gross per 24 hour  Intake              440 ml  Output             5850 ml  Net            -5410 ml   Filed Weights   06/10/17 0500 06/11/17 0100 06/12/17 0100  Weight: 90.9 kg (200 lb 6.4 oz) 93.4 kg (205 lb 14.6 oz) 87.4 kg (192 lb 10.9 oz)   Gen: Awake, Alert, Oriented X 2, resting comfortably HEENT: PERRLA, Neck supple, no JVD Lungs: Decreased breath sounds to bases CVS: RRR,No Gallops,Rubs or new Murmurs Abd: soft, Non tender, non distended, BS present Extremities: No Cyanosis, Clubbing or edema Skin: no new rashes  Data Reviewed: CBC:  Recent Labs Lab 06/09/17 0425 06/09/17 1401 06/10/17 0519 06/11/17 0326 06/11/17 1512 06/12/17 0307  WBC 12.5*  --  9.5 10.3 11.0* 11.4*  NEUTROABS  --   --   --   --   --  7.5  HGB 7.9* 9.3* 8.4* 8.3* 9.8* 9.3*  HCT 22.5* 26.5* 24.2* 24.4* 28.1* 27.1*  MCV 94.1  --  91.7 94.9 94.3 94.8  PLT 175  --  162 176 226 545   Basic Metabolic Panel:  Recent Labs Lab 06/08/17 0519 06/09/17 0425 06/10/17 0519 06/11/17 0326 06/12/17 0307  NA 140 141 140 141 138  K 3.6 3.7 3.2* 3.6 3.2*  CL 113* 115* 115* 114* 105  CO2 23 23 22  21* 28  GLUCOSE 101* 100* 100* 99 112*  BUN 14 12 10 12 19   CREATININE 0.90 0.93 0.83 0.85 0.83  CALCIUM 7.0* 7.0* 7.2* 7.3* 7.6*  MG 1.8 1.5*  --  1.5* 1.6*  PHOS 2.2* 2.2*  --   --   --     Liver Function Tests:  Recent Labs Lab 06/05/17 1848 06/06/17 0329 06/12/17 0307  AST  25 25 19   ALT 26 25 14*  ALKPHOS 52 53 59  BILITOT 0.9 1.1 1.0  PROT 5.7* 5.9* 5.0*  ALBUMIN 3.0* 3.2* 2.4*   No results for input(s): LIPASE, AMYLASE in the last 168 hours. No results for input(s): AMMONIA in the last 168 hours. Coagulation Profile:  Recent Labs Lab 06/05/17 1848 06/12/17 0307  INR 1.12 1.13   Cardiac Enzymes:  Recent Labs Lab 06/07/17 0735 06/07/17 1423 06/07/17 1948  TROPONINI <0.03 <0.03 <0.03   BNP (last 3 results) No results for input(s): PROBNP in the last 8760 hours. CBG:  Recent Labs Lab 06/07/17 1616  GLUCAP 92   Studies: No results found.  Scheduled Meds: . dextromethorphan  30 mg Oral BID  . fluticasone  1 spray Each Nare Daily  . levothyroxine  112 mcg Oral QAC breakfast  . mouth rinse  15 mL Mouth Rinse BID  . mometasone-formoterol  2 puff Inhalation BID  . montelukast  10 mg Oral QHS  . pantoprazole  40 mg Oral Q1200  . potassium chloride  40 mEq Oral BID  . protein supplement shake  11 oz Oral Q24H  . simvastatin  10 mg Oral QHS   Continuous Infusions: PRN Meds: albuterol, oxybutynin  Time spent: 52min  Author: Domenic Polite, MD [facility] Page via [email or web address], password [facility]  06/12/2017 2:02 PM  If 7PM-7AM, please contact night-coverage at [email or web address], password [facility]

## 2017-06-12 NOTE — Progress Notes (Signed)
PT Note  Patient Details Name: Vincent Robinson MRN: [id number] DOB: [birth year]   Eval completed. Pt from home with his wife. Need to be independent, feel he will progress fairly well. Today he was a little SOB, at bedside on 2 L O2 was at 89-90% , so increased to 3L with ambulation.  Short ambulation distance of 60 feet no AD no LOB, began to feel a little lightheaded, when we returned to bedside BP was at 92/50. It returned to 109/71 after sitting for about 2 minutes and pursed lip breathing, O2 at 94% with 3 L 02 with walking. Returned pt to 2L and pt returned to 90% at bedside.   Recommend nursing talk patient for short walks throughout the day. Recommend HHPT to f/u with pt upon DC as well. We will continue to follow while here in acute care.   Full write up to follow.    Clide Dales 06/12/2017, 6:06 PM Clide Dales, PT Pager: [phone] 06/12/2017

## 2017-06-12 NOTE — Progress Notes (Signed)
Pt. placed on BiPAP for h/s with oxygen added to circuit at 2 lpm, humidity filled, pt. using own hose & mask, tolerating well.

## 2017-06-13 LAB — CBC
HEMATOCRIT: 27.5 % — AB (ref 39.0–52.0)
Hemoglobin: 9.1 g/dL — ABNORMAL LOW (ref 13.0–17.0)
MCH: 31.5 pg (ref 26.0–34.0)
MCHC: 33.1 g/dL (ref 30.0–36.0)
MCV: 95.2 fL (ref 78.0–100.0)
PLATELETS: 251 10*3/uL (ref 150–400)
RBC: 2.89 MIL/uL — ABNORMAL LOW (ref 4.22–5.81)
RDW: 18.5 % — AB (ref 11.5–15.5)
WBC: 9.8 10*3/uL (ref 4.0–10.5)

## 2017-06-13 LAB — BASIC METABOLIC PANEL
ANION GAP: 7 (ref 5–15)
BUN: 19 mg/dL (ref 6–20)
CHLORIDE: 104 mmol/L (ref 101–111)
CO2: 26 mmol/L (ref 22–32)
CREATININE: 0.86 mg/dL (ref 0.61–1.24)
Calcium: 7.8 mg/dL — ABNORMAL LOW (ref 8.9–10.3)
GFR calc Af Amer: >60 mL/min (ref >60)
GFR calc non Af Amer: >60 mL/min (ref >60)
Glucose, Bld: 95 mg/dL (ref 65–99)
POTASSIUM: 3.9 mmol/L (ref 3.5–5.1)
Sodium: 137 mmol/L (ref 135–145)

## 2017-06-13 MED ORDER — FUROSEMIDE 10 MG/ML IJ SOLN
20.0000 mg | Freq: Once | INTRAMUSCULAR | Status: AC
  Administered 2017-06-13: 20 mg via INTRAVENOUS
  Filled 2017-06-13: qty 2

## 2017-06-13 MED ORDER — POTASSIUM CHLORIDE 20 MEQ/15ML (10%) PO SOLN
40.0000 meq | Freq: Once | ORAL | Status: AC
  Administered 2017-06-13: 40 meq via ORAL
  Filled 2017-06-13: qty 30

## 2017-06-13 NOTE — Progress Notes (Signed)
Pt up in the bathroom getting bath with NT, tol well. Asked to sit in chair for lunch refused, per the bed at present. Will get pt up to ambulate again. SRP, RN

## 2017-06-13 NOTE — Progress Notes (Signed)
[facility] Progress Note  Patient: Vincent Robinson VZS:[id number]   PCP: Katherina Mires, MD DOB: Sep 05, 1923   DOA: 06/05/2017   DOS: 06/13/2017   Date of Service: the patient was seen and examined on 06/13/2017  Subjective: Breathing improving, no chest pain, no further bleeding  Brief hospital course: Pt. with PMH of COPD,OSA on BiPAP, hypothyroidism, HLD, asthma; admitted on 06/05/2017, presented with complaint of BRBPR, was found to have diverticular bleed. Patient was initially admitted on the stepdown but then developed hemorrhagic shock requiring transfer to ICU and pressors. S/P 4 PRBC transfusion. Negative nuclear bleeding scan. Hemoglobin now stable. Then developed respiratory distress due to pulmonary edema/vol overload, improving with diuresis  Assessment and Plan: 1. Acute diverticular GI bleed/Hemorrhagic shock. -Resolved, required pressor support in ICU -S/P 4 PRBC. -Followed by GI, had a nuclear medicine scan which was negative - no further bleeding now and hemoglobin is relatively stable - monitor CBC  2. Acute hypoxic respiratory failure -Secondary to acute on chronic diastolic CHF and obstructive sleep apnea  -This time primarily related to pulmonary edema -Improving well with diuresis, negative over 3 L yesterday -Still has basilar crackles and increased dyspnea on exertion hence will give him on IV Lasix again today -Monitor I/O -On BiPAP daily at bedtime for sleep apnea at baseline -B met in a.m.  3. COPD/asthma. -Stable, on 2 L O2 at baseline  -Continue daily at bedtime BiPAP for sleep apnea  -Continue Flonase, 2I and Singulair  -Physical therapy evaluation and early ambulation   4. Hypothyroidism. Continue Synthroid.  5. Overactive bladder. Patient uses when necessary oxybutynin which have been continued.  DVT Prophylaxis: mechanical compression device  Advance goals of care discussion: full code  Family Communication: nofamily was present at  bedside, at the time of interview.  Disposition: back to assisted living tomorrow if stable  Consultants: CCM, gastroenterology Procedures: PRBC, NM bleeding scan  Antibiotics: Anti-infectives    None       Objective: Physical Exam: Vitals:   06/12/17 2124 06/13/17 0523 06/13/17 0920 06/13/17 1310  BP:  106/62 123/71 92/66  Pulse:  79 85 66  Resp:  (!) 22 (!) 22 20  Temp:  97.9 F (36.6 C) 98.2 F (36.8 C) 98.5 F (36.9 C)  TempSrc:  Oral Oral Oral  SpO2: 91% 96% 94% 94%  Weight:  84.4 kg (186 lb 1.6 oz)    Height:        Intake/Output Summary (Last 24 hours) at 06/13/17 1407 Last data filed at 06/13/17 1311  Gross per 24 hour  Intake                0 ml  Output             2725 ml  Net            -2725 ml   Filed Weights   06/11/17 0100 06/12/17 0100 06/13/17 0523  Weight: 93.4 kg (205 lb 14.6 oz) 87.4 kg (192 lb 10.9 oz) 84.4 kg (186 lb 1.6 oz)   Gen: Awake, Alert, Oriented X 3,  HEENT: PERRLA, Neck supple, no JVD Lungs: Bibasilar crackles noted CVS: RRR,No Gallops,Rubs or new Murmurs Abd: soft, Non tender, non distended, BS present Extremities: No Cyanosis, Clubbing, 1+ edema Skin: no new rashes Data Reviewed: CBC:  Recent Labs Lab 06/10/17 0519 06/11/17 0326 06/11/17 1512 06/12/17 0307 06/13/17 0452  WBC 9.5 10.3 11.0* 11.4* 9.8  NEUTROABS  --   --   --  7.5  --  HGB 8.4* 8.3* 9.8* 9.3* 9.1*  HCT 24.2* 24.4* 28.1* 27.1* 27.5*  MCV 91.7 94.9 94.3 94.8 95.2  PLT 162 176 226 214 496   Basic Metabolic Panel:  Recent Labs Lab 06/08/17 0519 06/09/17 0425 06/10/17 0519 06/11/17 0326 06/12/17 0307 06/13/17 0452  NA 140 141 140 141 138 137  K 3.6 3.7 3.2* 3.6 3.2* 3.9  CL 113* 115* 115* 114* 105 104  CO2 '23 23 22 ' 21* 28 26  GLUCOSE 101* 100* 100* 99 112* 95  BUN '14 12 10 12 19 19  ' CREATININE 0.90 0.93 0.83 0.85 0.83 0.86  CALCIUM 7.0* 7.0* 7.2* 7.3* 7.6* 7.8*  MG 1.8 1.5*  --  1.5* 1.6*  --   PHOS 2.2* 2.2*  --   --   --   --     Liver  Function Tests:  Recent Labs Lab 06/12/17 0307  AST 19  ALT 14*  ALKPHOS 59  BILITOT 1.0  PROT 5.0*  ALBUMIN 2.4*   No results for input(s): LIPASE, AMYLASE in the last 168 hours. No results for input(s): AMMONIA in the last 168 hours. Coagulation Profile:  Recent Labs Lab 06/12/17 0307  INR 1.13   Cardiac Enzymes:  Recent Labs Lab 06/07/17 0735 06/07/17 1423 06/07/17 1948  TROPONINI <0.03 <0.03 <0.03   BNP (last 3 results) No results for input(s): PROBNP in the last 8760 hours. CBG:  Recent Labs Lab 06/07/17 1616  GLUCAP 92   Studies: No results found.  Scheduled Meds: . dextromethorphan  30 mg Oral BID  . fluticasone  1 spray Each Nare Daily  . levothyroxine  112 mcg Oral QAC breakfast  . mouth rinse  15 mL Mouth Rinse BID  . mometasone-formoterol  2 puff Inhalation BID  . montelukast  10 mg Oral QHS  . pantoprazole  40 mg Oral Q1200  . protein supplement shake  11 oz Oral Q24H  . simvastatin  10 mg Oral QHS   Continuous Infusions: PRN Meds: albuterol, oxybutynin  Time spent: 74mn  Author: PDomenic Polite MD [facility] Page via [email or web address], password [facility] 06/13/2017 2:07 PM  If 7PM-7AM, please contact night-coverage at [email or web address], password [email or web address]

## 2017-06-13 NOTE — Progress Notes (Signed)
Pt states that he will self administer BIPAP when ready for bed.  RT to monitor and assess as needed.

## 2017-06-13 NOTE — Progress Notes (Addendum)
Pt ambulated with Student nurse Almyra Free to nursing station, tol activity without issues. SRP, RN

## 2017-06-13 NOTE — Progress Notes (Signed)
Attempted to ambulate pt. Pt. Declined at this time, agreed to ambulate in an hour

## 2017-06-13 NOTE — Progress Notes (Signed)
Pt selected [facility] for [facility] needs. Will need HHRN/PT orders and face to face please.

## 2017-06-13 NOTE — Care Management Important Message (Signed)
Important Message  Patient Details  Name: Vincent Robinson MRN: [id number] Date of Birth: [birth year]   Medicare Important Message Given:  Yes    [name] 06/13/2017, 11:02 AM

## 2017-06-13 NOTE — Progress Notes (Signed)
Pt stable after ambulation. Tolerated without issues. Pt O2 sats  92% on 2 liters. SRP, RN

## 2017-06-13 NOTE — Progress Notes (Signed)
Pt. Ambulated with minimal assist from pt. Room to nurses station with 2L [place].  Pt. Showed no discomfort, no dizziness. SpO2 when back to room: 92% 2L [place]

## 2017-06-14 LAB — BASIC METABOLIC PANEL
ANION GAP: 6 (ref 5–15)
BUN: 18 mg/dL (ref 6–20)
CALCIUM: 8.1 mg/dL — AB (ref 8.9–10.3)
CO2: 28 mmol/L (ref 22–32)
Chloride: 102 mmol/L (ref 101–111)
Creatinine, Ser: 0.94 mg/dL (ref 0.61–1.24)
GLUCOSE: 102 mg/dL — AB (ref 65–99)
POTASSIUM: 4 mmol/L (ref 3.5–5.1)
SODIUM: 136 mmol/L (ref 135–145)

## 2017-06-14 LAB — CBC
HCT: 27.4 % — ABNORMAL LOW (ref 39.0–52.0)
Hemoglobin: 9.3 g/dL — ABNORMAL LOW (ref 13.0–17.0)
MCH: 32.9 pg (ref 26.0–34.0)
MCHC: 33.9 g/dL (ref 30.0–36.0)
MCV: 96.8 fL (ref 78.0–100.0)
PLATELETS: 270 10*3/uL (ref 150–400)
RBC: 2.83 MIL/uL — AB (ref 4.22–5.81)
RDW: 17.9 % — AB (ref 11.5–15.5)
WBC: 11.3 10*3/uL — ABNORMAL HIGH (ref 4.0–10.5)

## 2017-06-14 MED ORDER — PANTOPRAZOLE SODIUM 40 MG PO TBEC: 40.0000 mg | DELAYED_RELEASE_TABLET | Freq: Every day | ORAL | 0 refills | Status: DC

## 2017-06-14 MED ORDER — ASPIRIN 81 MG PO CHEW: 81.0000 mg | CHEWABLE_TABLET | Freq: Every day | ORAL | Status: DC

## 2017-06-14 NOTE — Progress Notes (Signed)
Pt discharge, instruction reviewed with pt acknowledged understanding. Family inwaiting area. Pt stable and left with O2 in place. SRP,RN

## 2017-06-14 NOTE — Consult Note (Signed)
Nutrition Education Note  RD consulted for nutrition education regarding a HIgh Fiber diet.    RD provided "High Fiber Nutrition Therapy" handout from the Academy of [employer] and [employer]etics. Discussed nutrition therapy to reduce the irritation of the gastrointestinal tract and to promote healthy bacterial growth. Provided list of recommended high fiberous foods in comparison to foods with low amounts of fiber, as well as a recommended sample day. Discussed prebiotics and probiotics and the foods they can be found in.Teach back method used.  Expect great compliance.  Body mass index is 29.56 kg/m. Pt meets criteria for overweight based on current BMI.  Current diet order is low sodium, patient is consuming approximately 90% of meals at this time. Labs and medications reviewed. RD contact information provided. If additional nutrition issues arise, please re-consult RD.  Vincent Robinson RD, LDN Clinical Nutrition Pager # [phone]

## 2017-06-17 NOTE — Discharge Summary (Signed)
Physician Discharge Summary  Vincent Robinson AJO:[id number] DOB: [birth year] DOA: 06/05/2017  PCP: Katherina Mires, MD  Admit date: 06/05/2017 Discharge date: 06/17/2017  Time spent: 35 minutes  Recommendations for Outpatient Follow-up:  1. PCP in 1 week, please assess volume status and need for diuretics at FU   Discharge Diagnoses:   Hemorrhagic shock  Diverticular bleed  Acute hypoxic resp failure  Skin cancer   COPD (chronic obstructive pulmonary disease) (HCC)   Sleep apnea   Hypothyroidism   Hyperlipidemia   Asthma   Acute blood loss anemia   Hemorrhagic shock [facility])   Discharge Condition: stable  Diet recommendation: low sodium heart healthy  Filed Weights   06/12/17 0100 06/13/17 0523 06/14/17 0539  Weight: 87.4 kg (192 lb 10.9 oz) 84.4 kg (186 lb 1.6 oz) 85.6 kg (188 lb 11.4 oz)    History of present illness:  Pt. with PMH of COPD,OSA on BiPAP, hypothyroidism, HLD, asthma; admitted on 06/05/2017, presented with complaint of BRBPR.  Hospital Course:  1. Acute diverticular GI bleed/Hemorrhagic shock. -Resolved, required pressor support in ICU -S/P 4 PRBC. -Followed by GI, had a nuclear medicine scan which was negative -no further bleeding now and hemoglobin is relatively stable  2. Acute hypoxic respiratory failure -Secondary to acute on chronic diastolic CHF and obstructive sleep apnea  -This time primarily related to pulmonary edema -Improving well with diuresis and back on baseline 2L Home O2 -On BiPAP daily at bedtime for sleep apnea at baseline -euvolemic now and BP around 130mmHg and hence didn't discharge him on any lasix, please assess volume status and need for diuretics at FU  3. COPD/asthma. -Stable, on 2 L O2 at baseline  -Continue daily at bedtime BiPAP for sleep apnea  -Continue Flonase, 2I and Singulair  -Physical therapy evaluation completed, HHPT set up  4. Hypothyroidism. Continue Synthroid.  5. Overactive bladder. Patient uses when  necessary oxybutynin which have been continued.  6. OSA -on BIPAP QHS   Procedures:  NM bleeding scan  Consultations:  GI Dr.Hung/Mann  Discharge Exam: Vitals:   06/14/17 0539 06/14/17 0908  BP: (!) 105/57   Pulse: 74   Resp: 18   Temp: (!) 97.5 F (36.4 C)   SpO2: 94% 94%    General: AAOx3 Cardiovascular: S1S2/RRR Respiratory: CTAB  Discharge Instructions   Discharge Instructions    Diet - low sodium heart healthy    Complete by:  As directed    Increase activity slowly    Complete by:  As directed      Discharge Medication List as of 06/14/2017  9:41 AM    START taking these medications   Details  pantoprazole (PROTONIX) 40 MG tablet Take 1 tablet (40 mg total) by mouth daily at 12 noon., Starting Fri 06/14/2017, Print      CONTINUE these medications which have CHANGED   Details  aspirin (ASPIRIN 81) 81 MG chewable tablet Chew 1 tablet (81 mg total) by mouth daily. Restart in 1 week, Starting Fri 06/14/2017, No Print      CONTINUE these medications which have NOT CHANGED   Details  albuterol (PROVENTIL HFA;VENTOLIN HFA) 108 (90 Base) MCG/ACT inhaler Inhale 1 puff into the lungs 2 (two) times daily., Starting Mon 05/20/2017, Print    fluticasone (VERAMYST) 27.5 MCG/SPRAY nasal spray Place 1 spray into the nose daily., Historical Med    Fluticasone-Salmeterol (AIRDUO RESPICLICK 947/09) 628-36 MCG/ACT AEPB Inhale 1 puff into the lungs 2 (two) times daily., Starting Tue 03/05/2017, Print  levothyroxine (SYNTHROID, LEVOTHROID) 112 MCG tablet Take 112 mcg by mouth daily before breakfast., Historical Med    montelukast (SINGULAIR) 10 MG tablet Take 1 tablet (10 mg total) by mouth at bedtime., Starting Tue 05/28/2017, Print    simvastatin (ZOCOR) 20 MG tablet Take 10 mg by mouth at bedtime., Starting Fri 03/22/2017, Historical Med    oxybutynin (DITROPAN) 5 MG tablet Take 5 mg by mouth every 8 (eight) hours as needed for bladder spasms., Historical Med      STOP  taking these medications     dextromethorphan (DELSYM) 30 MG/5ML liquid        No Known Allergies    The results of significant diagnostics from this hospitalization (including imaging, microbiology, ancillary and laboratory) are listed below for reference.    Significant Diagnostic Studies: Dg Chest 2 View  Result Date: 06/04/2017 CLINICAL DATA:  Recent pneumothorax.  COPD EXAM: CHEST  2 VIEW COMPARISON:  May 27, 2017 FINDINGS: Previously noted right-sided pneumothorax is no longer evident. There is interstitial fibrotic type change throughout the lungs bilaterally. There is no frank edema or consolidation. There is focal scarring in the bases. Heart is enlarged with pulmonary vascularity within normal limits. Patient is status post coronary artery bypass grafting. There is aortic atherosclerosis. There is degenerative change in the thoracic spine and each shoulder. No evident adenopathy. IMPRESSION: No evident pneumothorax. Underlying interstitial fibrotic change with mild bibasilar scarring. No edema or consolidation. Stable cardiac prominence. There is aortic atherosclerosis. Patient is status post coronary artery bypass grafting. Aortic Atherosclerosis (ICD10-I70.0). Electronically Signed   By: Lowella Grip III M.D.   On: 06/04/2017 19:20   Dg Chest 2 View  Result Date: 05/27/2017 CLINICAL DATA:  Shortness of breath, pneumothorax, followup, history COPD EXAM: CHEST  2 VIEW COMPARISON:  05/23/2017 FINDINGS: Enlargement of cardiac silhouette post CABG. Mediastinal contours and pulmonary vascularity normal. Emphysematous and bronchitic changes consistent with COPD. Mild bibasilar atelectasis. Remaining lungs clear. Persistent small RIGHT apex pneumothorax, slightly decreased. Diffuse osseous demineralization. IMPRESSION: COPD changes with persistent small RIGHT apex pneumothorax, slightly decreased. Aortic Atherosclerosis (ICD10-I70.0) and Emphysema (ICD10-J43.9). Electronically Signed    By: Lavonia Dana M.D.   On: 05/27/2017 12:07   Dg Chest 2 View  Result Date: 05/23/2017 CLINICAL DATA:  History of right-sided pneumothorax, followup EXAM: CHEST  2 VIEW COMPARISON:  Portable chest x-ray of 05/22/2017 and 05/21/2017 FINDINGS: There is little change in volume of the right apical pneumothorax. Volume loss in the right lung base remains and a tiny right effusion cannot be excluded. The left lung is grossly clear. Mediastinal and hilar contours are unremarkable. Cardiomegaly is stable. Median sternotomy sutures are noted from prior CABG. No acute bony abnormality is seen. IMPRESSION: No change in small right apical pneumothorax. Electronically Signed   By: Ivar Drape M.D.   On: 05/23/2017 08:27   Dg Chest 2 View  Result Date: 05/20/2017 CLINICAL DATA:  Shortness of breath. EXAM: CHEST  2 VIEW COMPARISON:  Radiographs of November 06, 2016. FINDINGS: Stable cardiomediastinal silhouette. Atherosclerosis of thoracic aorta is noted. Sternotomy wires are noted. Stable interstitial densities are noted throughout both lungs most consistent with scarring. Minimal right pleural effusion is noted. Mild to moderate right apical pneumothorax is noted which is new since prior exam. Bony thorax is unremarkable. IMPRESSION: Interval development of mild to moderate right apical pneumothorax. Critical Value/emergent results were called by telephone at the time of interpretation on 05/20/2017 at 4:32 pm to [place], who verbally acknowledged  these results and will immediately contact Dr. Vaughan Browner with this finding. Aortic atherosclerosis. Minimal right pleural effusion. Electronically Signed   By: Marijo Conception, M.D.   On: 05/20/2017 16:33   Ct Chest Wo Contrast  Result Date: 05/20/2017 CLINICAL DATA:  COPD, dyspnea on exertion and chest x-ray today demonstrating spontaneous right-sided pneumothorax. EXAM: CT CHEST WITHOUT CONTRAST TECHNIQUE: Multidetector CT imaging of the chest was performed following  the standard protocol without IV contrast. COMPARISON:  Chest x-ray earlier today at [facility] and prior CTA of the chest on 07/15/2015 at [facility]. FINDINGS: Cardiovascular: Stable heart size and evidence of prior CABG. The ascending thoracic aorta is mildly dilated and measures up to 4.6 cm in greatest diameter. The proximal arch measures 3.7 cm. The distal arch measures 3.1 cm. The descending thoracic aorta measures 2.8 cm. No pericardial fluid. Mediastinum/Nodes: No mediastinal, hilar or axillary lymphadenopathy identified. No mediastinal masses. Trachea and esophagus appear grossly unremarkable. Lungs/Pleura: There is evidence of a right sided pneumothorax with apical, lower lateral and basilar components. There is a small amount of associated pleural fluid. Overall volume of pneumothorax estimated to be roughly 20%. No evidence to suggest obvious tension pneumothorax. There is evidence of underlying chronic interstitial lung disease with interstitial thickening present as well as probable early fibrotic changes at the lung bases and mild lower lobe bronchiectasis bilaterally. No significant blebs are present. Stable parenchymal scarring is present at the posterior right upper lung abutting the pleura and demonstrating partial calcification. This area appears stable and has not enlarged since 2016. No parenchymal airspace consolidation or focal mass lesions identified. Upper Abdomen: No acute abnormality. Musculoskeletal: No chest wall mass or suspicious bone lesions identified. IMPRESSION: 1. Right-sided pneumothorax with apical, lateral and basilar. Overall volume estimated to be roughly 20%. 2. Stable underlying chronic interstitial lung disease with probable early pulmonary fibrosis of the lower lobes bilaterally and evidence of mild lower lobe bronchiectasis. 3. Stable posterior right upper lobe pleuroparenchymal scarring. 4. Aneurysmal disease of the ascending thoracic aorta which  measures up to 4.6 cm in greatest diameter. Ascending thoracic aortic aneurysm. Recommend semi-annual imaging followup by CTA or MRA and referral to cardiothoracic surgery if not already obtained. This recommendation follows 2010 ACCF/AHA/AATS/ACR/ASA/SCA/SCAI/SIR/STS/SVM Guidelines for the Diagnosis and Management of Patients With Thoracic Aortic Disease. Circulation. 2010; 121: Y606-T016 Aortic aneurysm NOS (ICD10-I71.9). Electronically Signed   By: Aletta Edouard M.D.   On: 05/20/2017 21:30   Nm Gi Blood Loss  Result Date: 06/07/2017 CLINICAL DATA:  Gastrointestinal bleeding. Active gastrointestinal bleeding. Last bloody bowel movement at 6 and. Thursday EXAM: NUCLEAR MEDICINE GASTROINTESTINAL BLEEDING SCAN TECHNIQUE: Sequential abdominal images were obtained following intravenous administration of Tc-67m labeled red blood cells. RADIOPHARMACEUTICALS:  Twenty-three mCi Tc-77m in-vitro labeled red cells. COMPARISON:  06/05/2017 FINDINGS: Imaging of the abdomen was performed over a two hour interval. There is no accumulationof tagged red blood cells to localize active gastrointestinal bleeding. Physiologic uptake noted in the solid organs, blood pool, and GU tract. IMPRESSION: No scintigraphic evidence of active gastrointestinal bleeding. Electronically Signed   By: Suzy Bouchard M.D.   On: 06/07/2017 13:28   Ct Abdomen Pelvis W Contrast  Result Date: 06/05/2017 CLINICAL DATA:  Rectal bleeding.  Hypotensive. EXAM: CT ABDOMEN AND PELVIS WITH CONTRAST TECHNIQUE: Multidetector CT imaging of the abdomen and pelvis was performed using the standard protocol following bolus administration of intravenous contrast. CONTRAST:  168mL ISOVUE-300 IOPAMIDOL (ISOVUE-300) INJECTION 61% COMPARISON:  CT 07/15/2015 FINDINGS: Lower chest: Lung bases  are clear. Hepatobiliary: No focal hepatic lesion. No biliary duct dilatation. Gallbladder is normal. Common bile duct is normal. Pancreas: Pancreas is normal. No ductal  dilatation. No pancreatic inflammation. Spleen: Normal spleen Adrenals/urinary tract: Adrenal glands and kidneys are normal. The ureters and bladder normal. Stomach/Bowel: Stomach, duodenum, and small bowel are normal. Cecum oriented ventral midline and superiorly in the abdomen. No evidence of cecal dilatation or twisting. Appendix not identified. There several diverticulum along the transverse colon descending colon. Region of asymmetry mild wall thickening at the level of the proximal descending colon seen best on coronal image 76, series 3. No obstruction related to this of focal wall thickening which occurs over approximately 4 cm segment (image 77, series 3 close per. This also seen on image 171, series 4. There are multiple diverticular the descending colon sigmoid colon. Rectum is grossly normal. Vascular/Lymphatic: There is an abdominal aortic stent graft. The stent appears widely patent without evidence leak. The SMA and celiac trunk are widely patent. Reproductive: Prostate gland is normal size Other: There is a small LEFT rectus sheath hematoma within measuring 3.0 x 3.5 by 5.3 cm (volume = 29 cm^3). Musculoskeletal: No aggressive osseous lesion. IMPRESSION: 1. No clear explanation for gastrointestinal bleeding. There is a focus of thickening along the descending colon which is asymmetric and cannot exclude a mucosal neoplasm. There also multiple diverticula of the descending colon and sigmoid colon which could also be a source of hemorrhage. No evidence of bowel ischemia. SMA and celiac trunk widely patent. 2. Small acute hematoma within the LEFT rectus muscle with a calculated volume of 30 cubic cm. Electronically Signed   By: Suzy Bouchard M.D.   On: 06/05/2017 20:26   Dg Chest Port 1 View  Result Date: 06/11/2017 CLINICAL DATA:  Shortness of breath . EXAM: PORTABLE CHEST 1 VIEW COMPARISON:  06/04/2017 . FINDINGS: Prior CABG. Cardiomegaly with pulmonary vascular prominence and basilar  interstitial prominence. Small bilateral pleural effusions. Findings consist with mild CHF. No pneumothorax . IMPRESSION: Prior CABG. Congestive heart failure with mild basilar interstitial edema and small bilateral pleural effusions . Electronically Signed   By: Marcello Moores  Register   On: 06/11/2017 07:25   Dg Chest Port 1 View  Result Date: 05/22/2017 CLINICAL DATA:  Pneumothorax. EXAM: PORTABLE CHEST 1 VIEW COMPARISON:  05/21/2017 . FINDINGS: Prior CABG. Heart size normal. Low lung volumes with mild basilar atelectasis and infiltrates. Small left pleural effusion. Right costophrenic angle not imaged. Tiny right apical pneumothorax. IMPRESSION: 1. Low lung volumes with mild bibasilar atelectasis and infiltrates. Small left pleural effusion 2.  Small right apical pneumothorax.  No change. 3.  Prior CABG.  Heart size normal. Electronically Signed   By: Marcello Moores  Register   On: 05/22/2017 07:22   Dg Chest Port 1 View  Result Date: 05/21/2017 CLINICAL DATA:  Pneumothorax. EXAM: PORTABLE CHEST 1 VIEW COMPARISON:  CT 05/20/2017.  Chest x-ray 05/20/2017 . FINDINGS: Prior CABG. Stable cardiomegaly. No pulmonary venous congestion. Interim slight improvement of right-sided pneumothorax. Small left pleural effusion. No acute bony abnormality . IMPRESSION: 1.  Interim slight improvement of right-sided pneumothorax. 2. Prior CABG.  Stable cardiomegaly . Electronically Signed   By: Marcello Moores  Register   On: 05/21/2017 06:46    Microbiology: No results found for this or any previous visit (from the past 240 hour(s)).   Labs: Basic Metabolic Panel:  Recent Labs Lab 06/11/17 0326 06/12/17 0307 06/13/17 0452 06/14/17 0416  NA 141 138 137 136  K 3.6 3.2* 3.9 4.0  CL 114* 105 104 102  CO2 21* 28 26 28   GLUCOSE 99 112* 95 102*  BUN 12 19 19 18   CREATININE 0.85 0.83 0.86 0.94  CALCIUM 7.3* 7.6* 7.8* 8.1*  MG 1.5* 1.6*  --   --    Liver Function Tests:  Recent Labs Lab 06/12/17 0307  AST 19  ALT 14*   ALKPHOS 59  BILITOT 1.0  PROT 5.0*  ALBUMIN 2.4*   No results for input(s): LIPASE, AMYLASE in the last 168 hours. No results for input(s): AMMONIA in the last 168 hours. CBC:  Recent Labs Lab 06/11/17 0326 06/11/17 1512 06/12/17 0307 06/13/17 0452 06/14/17 0416  WBC 10.3 11.0* 11.4* 9.8 11.3*  NEUTROABS  --   --  7.5  --   --   HGB 8.3* 9.8* 9.3* 9.1* 9.3*  HCT 24.4* 28.1* 27.1* 27.5* 27.4*  MCV 94.9 94.3 94.8 95.2 96.8  PLT 176 226 214 251 270   Cardiac Enzymes: No results for input(s): CKTOTAL, CKMB, CKMBINDEX, TROPONINI in the last 168 hours. BNP: BNP (last 3 results) No results for input(s): BNP in the last 8760 hours.  ProBNP (last 3 results) No results for input(s): PROBNP in the last 8760 hours.  CBG: No results for input(s): GLUCAP in the last 168 hours.     SignedDomenic Polite MD.  [facility] 06/17/2017, 4:08 PM

## 2017-07-02 ENCOUNTER — Telehealth: Payer: Self-pay | Admitting: Pulmonary Disease

## 2017-07-02 MED ORDER — BENZONATATE 100 MG PO CAPS: 100.0000 mg | ORAL_CAPSULE | Freq: Three times a day (TID) | ORAL | 1 refills | Status: DC | PRN

## 2017-07-02 NOTE — Telephone Encounter (Signed)
He can continue using the delsym for cough Call in tessalon perles 100 mg tid PRN as well. We can eval when he comes back to clinic next month. Please ask him to call back if there is any worsening.  Marshell Garfinkel MD [facility] Pager [phone] If no answer or after 3pm call: [phone] 07/02/2017, 12:45 PM

## 2017-07-02 NOTE — Telephone Encounter (Signed)
Vincent Robinson stated pt is complaining of dry cough and she hears some crackling on inspiration. She states she has an AVS that states pt is supposed to stop Delsym for cough. I did not see this in his chart. PM please advise what to tell pt to take for cough.

## 2017-07-02 NOTE — Telephone Encounter (Signed)
Spoke with Sharee Pimple and advised her of PM's recs. She verbalized understanding. Will go ahead and call in the tessalon to [facility] on [address]. Nothing else needed at time of call.

## 2017-07-12 ENCOUNTER — Telehealth: Payer: Self-pay | Admitting: Pulmonary Disease

## 2017-07-12 NOTE — Telephone Encounter (Signed)
Lm to make Vincent Robinson with [facility] aware of PM below message. Nothing further needed.

## 2017-07-12 NOTE — Telephone Encounter (Signed)
Spoke with Gerald Stabs with [facility], who is requesting verbal to continue PT once weekly for three weeks.  PM please advise. Thanks.

## 2017-07-12 NOTE — Telephone Encounter (Signed)
Vincent Robinson is returning phone call.

## 2017-07-12 NOTE — Telephone Encounter (Signed)
Called Gerald Stabs and advised to call back to discuss details and reasoning for PT. Left message to call back.

## 2017-07-12 NOTE — Telephone Encounter (Signed)
Ok to give order to continue PT.

## 2017-07-23 ENCOUNTER — Telehealth: Payer: Self-pay | Admitting: Pulmonary Disease

## 2017-07-23 NOTE — Telephone Encounter (Signed)
Spoke with Beth at [facility], states she was at pt's home and noted that pt c/o increased sob at rest and with exertion, a new nonprod cough,  L ankle has 2+ edema which is more than noted last week (pt and spouse state that this is normal).  Eustaquio Maize is requesting that we call pt and have him seen sooner than 10/12. Called and spoke with pt, scheduled to see PM tomorrow afternoon.  Nothing further needed.

## 2017-07-24 ENCOUNTER — Ambulatory Visit (INDEPENDENT_AMBULATORY_CARE_PROVIDER_SITE_OTHER)
Admission: RE | Admit: 2017-07-24 | Discharge: 2017-07-24 | Disposition: A | Discharge status: Home / Self Care | Payer: Medicare Other | Source: Ambulatory Visit | Attending: Pulmonary Disease | Admitting: Pulmonary Disease

## 2017-07-24 ENCOUNTER — Ambulatory Visit (INDEPENDENT_AMBULATORY_CARE_PROVIDER_SITE_OTHER): Payer: Medicare Other | Admitting: Pulmonary Disease

## 2017-07-24 ENCOUNTER — Encounter: Payer: Self-pay | Admitting: Pulmonary Disease

## 2017-07-24 VITALS — BP 124/72 | HR 102 | Ht 67.0 in | Wt 192.0 lb

## 2017-07-24 DIAGNOSIS — J8410 Pulmonary fibrosis, unspecified: Secondary | ICD-10-CM

## 2017-07-24 DIAGNOSIS — G4733 Obstructive sleep apnea (adult) (pediatric): Secondary | ICD-10-CM

## 2017-07-24 DIAGNOSIS — R0600 Dyspnea, unspecified: Secondary | ICD-10-CM | POA: Diagnosis not present

## 2017-07-24 DIAGNOSIS — R0602 Shortness of breath: Secondary | ICD-10-CM | POA: Diagnosis not present

## 2017-07-24 DIAGNOSIS — J449 Chronic obstructive pulmonary disease, unspecified: Secondary | ICD-10-CM | POA: Diagnosis not present

## 2017-07-24 MED ORDER — FUROSEMIDE 40 MG PO TABS: 40.0000 mg | ORAL_TABLET | Freq: Every day | ORAL | 2 refills | Status: DC

## 2017-07-24 NOTE — Patient Instructions (Signed)
We'll get a chest x-ray today Will start Lasix 40 mg a day Follow-up with Dr. Doreene Nest in 2 weeks. I'll let her know about her visit I'll see her back in 1 month to make sure it is okay

## 2017-07-24 NOTE — Progress Notes (Signed)
Vincent Robinson    [id number]    07-08-1923  Primary Care Physician:Vincent Robinson, Vincent Rodney, MD  Referring Physician: Katherina Mires, MD [address] [place], [place] [address]  Chief complaint:   Follow up for COPD Mild ILD, post inflammatory scarring Spontaneous pneumothorax  HPI: [name] is a delightful 83 year old with past history of COPD, hypersomnia with sleep apnea, allergic rhinitis, cough variant asthma. He recently moved from [place] to be close to his family and wants to establish care here. He is chief complaints of dyspnea on exertion. He has history of sleep apnea on BiPAP at settings of 20/6. This is report of CT of the chest from [place] which shows calcified nodule, chronic scarring. 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 He moved to [place] in 1956. Apparently his immigration was delayed due to a lung abnormality on imaging. He was eventually cleared. He does not have any history of TB, exposure to TB. He's had an episode of influenza while in [place].  Interim history: Hospitalized in July for a spontaneous pneumothorax that was treated conservatively. He was again readmitted in August for lower GI bleed. During this admission he was noted to be in acute hypoxic respiratory failure secondary to diastolic heart failure. He improved with diuresis. He notes that on discharge he did not have any ankle swelling and his breathing was doing very well. After discharge his ankles started swelling again and he gained about 10 pounds with worsening dyspnea. He has chronic cough with white mucus, denies any wheezing, fevers, chills.  Outpatient Encounter Prescriptions as of 07/24/2017  Medication Sig  . albuterol (PROVENTIL HFA;VENTOLIN HFA) 108 (90 Base) MCG/ACT inhaler Inhale 1 puff into the lungs 2 (two) times daily.  Marland Kitchen aspirin  (ASPIRIN 81) 81 MG chewable tablet Chew 1 tablet (81 mg total) by mouth daily. Restart in 1 week  . benzonatate (TESSALON PERLES) 100 MG capsule Take 1 capsule (100 mg total) by mouth 3 (three) times daily as needed for cough.  . fluticasone (VERAMYST) 27.5 MCG/SPRAY nasal spray Place 1 spray into the nose daily.  . Fluticasone-Salmeterol (AIRDUO RESPICLICK 353/29) 924-26 MCG/ACT AEPB Inhale 1 puff into the lungs 2 (two) times daily.  Marland Kitchen levothyroxine (SYNTHROID, LEVOTHROID) 112 MCG tablet Take 112 mcg by mouth daily before breakfast.  . montelukast (SINGULAIR) 10 MG tablet Take 1 tablet (10 mg total) by mouth at bedtime.  Marland Kitchen oxybutynin (DITROPAN) 5 MG tablet Take 5 mg by mouth every 8 (eight) hours as needed for bladder spasms.  . pantoprazole (PROTONIX) 40 MG tablet Take 1 tablet (40 mg total) by mouth daily at 12 noon.  . simvastatin (ZOCOR) 20 MG tablet Take 10 mg by mouth at bedtime.   No facility-administered encounter medications on file as of 07/24/2017.     Allergies as of 07/24/2017  . (No Known Allergies)    Past Medical History:  Diagnosis Date  . Asthma   . COPD (chronic obstructive pulmonary disease) ([place])   . Hyperlipidemia   . Hypothyroidism   . Skin cancer 2017  . Sleep apnea     Past Surgical History:  Procedure Laterality Date  . bleeding ulcer  2016  . BYPASS AXILLA/BRACHIAL ARTERY  1998  . colon bleed  2012  . COLONOSCOPY N/A 06/07/2017   Procedure:  COLONOSCOPY;  Surgeon: Carol Ada, MD;  Location: [facility];  Service: Endoscopy;  Laterality: N/A;  . DEBRIDMENT OF DECUBITUS ULCER  2002  . THORACIC AORTA STENT  2009    Family History  Problem Relation Age of Onset  . Emphysema Mother   . Stroke Father   . Movement disorder Sister   . Aneurysm Brother        Thoracic aorta  . Macular degeneration Brother     Social History   Social History  . Marital status: Married    Spouse name: N/A  . Number of children: N/A  . Years of education: N/A    Occupational History  . Not on file.   Social History Main Topics  . Smoking status: Former Smoker    Packs/day: 0.50    Quit date: 08/06/1961  . Smokeless tobacco: Never Used  . Alcohol use Not on file  . Drug use: Unknown  . Sexual activity: Not on file   Other Topics Concern  . Not on file   Social History Narrative  . No narrative on file   Review of systems: Review of Systems  Constitutional: Negative for fever and chills.  HENT: Negative.   Eyes: Negative for blurred vision.  Respiratory: as per HPI  Cardiovascular: Negative for chest pain and palpitations.  Gastrointestinal: Negative for vomiting, diarrhea, blood per rectum. Genitourinary: Negative for dysuria, urgency, frequency and hematuria.  Musculoskeletal: Negative for myalgias, back pain and joint pain.  Skin: Negative for itching and rash.  Neurological: Negative for dizziness, tremors, focal weakness, seizures and loss of consciousness.  Endo/Heme/Allergies: Negative for environmental allergies.  Psychiatric/Behavioral: Negative for depression, suicidal ideas and hallucinations.  All other systems reviewed and are negative.  Physical Exam: Blood pressure 124/72, pulse (!) 102, height 5\' 7"  (1.702 m), weight 87.1 kg (192 lb), SpO2 90 %. Gen:      No acute distress HEENT:  EOMI, sclera anicteric Neck:     No masses; no thyromegaly Lungs:    Mild basal crackles, No wheeze; normal respiratory effort CV:         Regular rate and rhythm; no murmurs Abd:      + bowel sounds; soft, non-tender; no palpable masses, no distension Ext:    1-2+ edema; adequate peripheral perfusion Skin:      Warm and dry; no rash Neuro: alert and oriented x 3 Psych: normal mood and affect  Data Reviewed: PFTs 02/28/16 FVC 3.37 (116%), FEV1 2.08 (808%), F/F 62 Mild obstructive airway disease, no broncho-dilator response  CT scan 07/15/15- 2 .3 x 1.7 cm right upper lobe calcified opacity likely to represent pleural , parenchymal  scarring. Mild centrilobular, paraseptal emphysema. Subpleural reticulation, fibrosis representing mild chronic ILD High-resolution CT 09/04/16 - stable right upper lobe scarring, hyper aerated lungs. Minimal interstitial lung disease and peripheral unchanged compared to 2016. Chest x-ray 05/20/17 - moderate right apical pneumothorax Chest x-ray 06/04/17- resolution of right apical pneumothorax. Chronic interstitial opacities Chest x-ray 06/11/17- CHF with small bilateral effusions. I have reviewed all images personally.  BiPAP download December 2017-January 2018 Time spent greater than 4 hours- 97% AHI 7.2  Assessment:  #1 Dyspnea I suspect that he has worsening CHF given the bilateral lower extremity edema and weight gain. There is no wheezing on examination and I don't believe he'll require antibiotics or steroids. I'll get a chest x-ray for better evaluation Start Lasix 40 mg a day He has follow-up with Dr. Doreene Nest in 2 weeks. He can be  evaluated by metabolic panel to make sure that he is tolerating the dialysis I'll see him back in clinic in a month's time.  #2 COPD, asthmatic bronchitis. He continues on airduo, albuterol inhalers and supplemental o2. He'll continue on the same. Pneumothorax has resolved  #3 Mild ILD, postinflammatory lung fibrosis Previous CTs have apparently shown a calcified RUL scarring and mild ILD. The repeat high res resolution CT shows stability of findings from 2016 - 2017. I believe this may be from prior infections and exposures during his time in [place] and during world war. We will continue to follow this. I do not believe he'll need aggressive workup such as biopsy as the findings are stable and given his advanced stage.    #4 OSA He continues on BiPAP with good compliance. .  Plan/Recommendations: - Chest x-ray, start Lasix 40 mg.  - Continue airduo, O2, bipap  Marshell Garfinkel MD [facility] Pager [phone] 07/24/2017,  4:39 PM  CC: Vincent Mires, MD

## 2017-08-02 ENCOUNTER — Ambulatory Visit: Payer: Medicare Other | Admitting: Pulmonary Disease

## 2017-08-09 ENCOUNTER — Telehealth: Payer: Self-pay | Admitting: Cardiovascular Disease

## 2017-08-09 NOTE — Telephone Encounter (Signed)
Received records from [place] for appointment on 09/09/17 with Dr Gwenlyn Found.  Records put with Dr Kennon Holter schedule for 09/09/17. lp

## 2017-08-28 ENCOUNTER — Ambulatory Visit (INDEPENDENT_AMBULATORY_CARE_PROVIDER_SITE_OTHER): Payer: Medicare Other | Admitting: Pulmonary Disease

## 2017-08-28 ENCOUNTER — Encounter: Payer: Self-pay | Admitting: Pulmonary Disease

## 2017-08-28 VITALS — BP 126/78 | HR 88 | Ht 67.5 in | Wt 183.0 lb

## 2017-08-28 DIAGNOSIS — G4733 Obstructive sleep apnea (adult) (pediatric): Secondary | ICD-10-CM

## 2017-08-28 DIAGNOSIS — J849 Interstitial pulmonary disease, unspecified: Secondary | ICD-10-CM | POA: Diagnosis not present

## 2017-08-28 DIAGNOSIS — R0602 Shortness of breath: Secondary | ICD-10-CM | POA: Diagnosis not present

## 2017-08-28 NOTE — Progress Notes (Signed)
Amar Sippel    [id number]    1923/01/28  Primary Care Physician:Briscoe, Jannifer Rodney, MD  Referring Physician: Katherina Mires, MD [place] [address] [place], [place] [address]  Chief complaint:   Follow up for COPD Mild ILD, post inflammatory scarring Spontaneous pneumothorax July 2018 > resolved  HPI: Mr. Nuzum is 83 year old with past history of COPD, hypersomnia with sleep apnea, allergic rhinitis, cough variant asthma. He recently moved from [place] to be close to his family and wants to establish care here. He is chief complaints of dyspnea on exertion. He has history of sleep apnea on BiPAP at settings of 20/6. This is report of CT of the chest from [place] which shows calcified nodule, chronic scarring. 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 He moved to [place] in 1956. Apparently his immigration was delayed due to a lung abnormality on imaging. He was eventually cleared. He does not have any history of TB, exposure to TB. He's had an episode of influenza while in [place].  Hospitalized in July for a spontaneous pneumothorax that was treated conservatively. He was again readmitted in August for lower GI bleed. During this admission he was noted to be in acute hypoxic respiratory failure secondary to diastolic heart failure. He improved with diuresis.  Interim history: Started on Lasix at last clinic visit due to worsening dyspnea with lower extremity edema.  He reports that his swelling is improved and breathing slightly better.  He has lost 10 pounds over the last 1 month on diuretics.  He is scheduled for evaluation by cardiology this week.  Outpatient Encounter Medications as of 08/28/2017  Medication Sig  . albuterol (PROVENTIL HFA;VENTOLIN HFA) 108 (90 Base) MCG/ACT inhaler Inhale 1 puff into the lungs 2 (two) times daily.  .  benzonatate (TESSALON PERLES) 100 MG capsule Take 1 capsule (100 mg total) by mouth 3 (three) times daily as needed for cough.  . fluticasone (VERAMYST) 27.5 MCG/SPRAY nasal spray Place 1 spray into the nose daily.  . Fluticasone-Salmeterol (AIRDUO RESPICLICK 841/32) 440-10 MCG/ACT AEPB Inhale 1 puff into the lungs 2 (two) times daily.  . furosemide (LASIX) 40 MG tablet Take 1 tablet (40 mg total) by mouth daily.  Marland Kitchen levothyroxine (SYNTHROID, LEVOTHROID) 112 MCG tablet Take 112 mcg by mouth daily before breakfast.  . montelukast (SINGULAIR) 10 MG tablet Take 1 tablet (10 mg total) by mouth at bedtime.  Marland Kitchen oxybutynin (DITROPAN) 5 MG tablet Take 5 mg by mouth every 8 (eight) hours as needed for bladder spasms.  . simvastatin (ZOCOR) 20 MG tablet Take 10 mg by mouth at bedtime.  . [DISCONTINUED] pantoprazole (PROTONIX) 40 MG tablet Take 1 tablet (40 mg total) by mouth daily at 12 noon.  Marland Kitchen aspirin (ASPIRIN 81) 81 MG chewable tablet Chew 1 tablet (81 mg total) by mouth daily. Restart in 1 week (Patient not taking: Reported on 08/28/2017)   No facility-administered encounter medications on file as of 08/28/2017.     Allergies as of 08/28/2017  . (No Known Allergies)    Past Medical History:  Diagnosis Date  . AAA (abdominal aortic aneurysm) ([place])   . Asthma   . Basal cell carcinoma   . COPD (chronic obstructive pulmonary disease) ([place])   . Coronary artery disease   . Hyperlipidemia   .  Hypothyroidism   . Skin cancer 2017  . Sleep apnea     Past Surgical History:  Procedure Laterality Date  . bleeding ulcer  2016  . BYPASS AXILLA/BRACHIAL ARTERY  1998  . colon bleed  2012  . DEBRIDMENT OF DECUBITUS ULCER  2002  . THORACIC AORTA STENT  2009    Family History  Problem Relation Age of Onset  . Emphysema Mother   . Stroke Father   . Movement disorder Sister   . Aneurysm Brother        Thoracic aorta  . Macular degeneration Brother     Social History   Socioeconomic History  .  Marital status: Married    Spouse name: Not on file  . Number of children: Not on file  . Years of education: Not on file  . Highest education level: Not on file  Social Needs  . Financial resource strain: Not on file  . Food insecurity - worry: Not on file  . Food insecurity - inability: Not on file  . Transportation needs - medical: Not on file  . Transportation needs - non-medical: Not on file  Occupational History  . Not on file  Tobacco Use  . Smoking status: Former Smoker    Packs/day: 0.50    Last attempt to quit: 08/06/1961    Years since quitting: 56.0  . Smokeless tobacco: Never Used  Substance and Sexual Activity  . Alcohol use: Not on file  . Drug use: Not on file  . Sexual activity: Not on file  Other Topics Concern  . Not on file  Social History Narrative  . Not on file   Review of systems: Review of Systems  Constitutional: Negative for fever and chills.  HENT: Negative.   Eyes: Negative for blurred vision.  Respiratory: as per HPI  Cardiovascular: Negative for chest pain and palpitations.  Gastrointestinal: Negative for vomiting, diarrhea, blood per rectum. Genitourinary: Negative for dysuria, urgency, frequency and hematuria.  Musculoskeletal: Negative for myalgias, back pain and joint pain.  Skin: Negative for itching and rash.  Neurological: Negative for dizziness, tremors, focal weakness, seizures and loss of consciousness.  Endo/Heme/Allergies: Negative for environmental allergies.  Psychiatric/Behavioral: Negative for depression, suicidal ideas and hallucinations.  All other systems reviewed and are negative.  Physical Exam: Blood pressure 126/78, pulse 88, height 5' 7.5" (1.715 m), weight 183 lb (83 kg), SpO2 90 %. Gen:      No acute distress HEENT:  EOMI, sclera anicteric Neck:     No masses; no thyromegaly Lungs:    Clear to auscultation bilaterally; normal respiratory effort CV:         Regular rate and rhythm; no murmurs Abd:      + bowel  sounds; soft, non-tender; no palpable masses, no distension Ext:    1-2 + edema, L > R; adequate peripheral perfusion Skin:      Warm and dry; no rash Neuro: alert and oriented x 3 Psych: normal mood and affect  Data Reviewed: PFTs 02/28/16 FVC 3.37 (116%), FEV1 2.08 (808%), F/F 62 Mild obstructive airway disease, no broncho-dilator response  CT scan 07/15/15- 2 .3 x 1.7 cm right upper lobe calcified opacity likely to represent pleural , parenchymal scarring. Mild centrilobular, paraseptal emphysema. Subpleural reticulation, fibrosis representing mild chronic ILD High-resolution CT 09/04/16 - stable right upper lobe scarring, hyper aerated lungs. Minimal interstitial lung disease and peripheral unchanged compared to 2016. Chest x-ray 05/20/17 - moderate right apical pneumothorax Chest x-ray 06/04/17- resolution of  right apical pneumothorax. Chronic interstitial opacities Chest x-ray 06/11/17- CHF with small bilateral effusions. Chest x-ray 07/24/17-stable chronic fibrotic lung disease I have reviewed all images personally.  BiPAP download December 2017-January 2018 Time spent greater than 4 hours- 97% AHI 7.2  Assessment:  #1 COPD, asthmatic bronchitis. He continues on airduo, albuterol inhalers and supplemental o2. He'll continue on the same.   #2 Mild ILD, postinflammatory lung fibrosis Previous CTs have apparently shown a calcified RUL scarring and mild ILD. The repeat high res resolution CT shows stability of findings from 2016 - 2017. I believe this may be from prior infections and exposures during his time in [place] and during world war. We will continue to follow this. I do not believe he'll need aggressive workup such as biopsy as the findings are stable and given his advanced stage.    #3 OSA He continues on BiPAP with good compliance. .  #4 Spontaneous pneumothorax Resolved with conservative management.  Last chest x-ray shows no evidence of  recurrence.  Plan/Recommendations: - Continue airduo, O2, bipap  Marshell Garfinkel MD [facility] Pager [phone] 08/28/2017, 4:35 PM  CC: Katherina Mires, MD

## 2017-08-28 NOTE — Patient Instructions (Signed)
I am glad that her breathing is slightly improved. Continue using the Lasix Continue the supplemental oxygen and inhalers We will follow-up in 3 months.

## 2017-08-30 ENCOUNTER — Encounter: Payer: Self-pay | Admitting: Cardiovascular Disease

## 2017-08-30 ENCOUNTER — Ambulatory Visit (INDEPENDENT_AMBULATORY_CARE_PROVIDER_SITE_OTHER): Payer: Medicare Other | Admitting: Cardiovascular Disease

## 2017-08-30 VITALS — BP 106/72 | HR 90 | Ht 67.5 in | Wt 184.0 lb

## 2017-08-30 DIAGNOSIS — I2510 Atherosclerotic heart disease of native coronary artery without angina pectoris: Secondary | ICD-10-CM | POA: Diagnosis not present

## 2017-08-30 DIAGNOSIS — E7800 Pure hypercholesterolemia, unspecified: Secondary | ICD-10-CM

## 2017-08-30 DIAGNOSIS — I714 Abdominal aortic aneurysm, without rupture, unspecified: Secondary | ICD-10-CM

## 2017-08-30 DIAGNOSIS — R0602 Shortness of breath: Secondary | ICD-10-CM | POA: Diagnosis not present

## 2017-08-30 NOTE — Patient Instructions (Signed)
Medication Instructions: Your physician recommends that you continue on your current medications as directed. Please refer to the Current Medication list given to you today.   Testing/Procedures: Your physician has requested that you have an echocardiogram. Echocardiography is a painless test that uses sound waves to create images of your heart. It provides your doctor with information about the size and shape of your heart and how well your heart's chambers and valves are working. This procedure takes approximately one hour. There are no restrictions for this procedure.  Follow-Up: Your physician recommends that you schedule a follow-up appointment in: 6 weeks with Dr. Gwenlyn Found.  If you need a refill on your cardiac medications before your next appointment, please call your pharmacy.

## 2017-08-30 NOTE — Assessment & Plan Note (Signed)
History of coronary artery disease status post bypass grafting 5 approximately 10 years ago in [place]. He apparently had a stress echo in 2017 that was normal and EF of 62%. He was cathed at the time of his endoluminal stent graft and found to have patent grafts. He denies chest pain. He has had increasing shortness of breath/dyspnea on exertion and is now oxygen dependent. Recently had a spontaneous pneumothorax which was treated conservatively. I'm going to get a 2-D echo for LV function and may consider functional study as well.

## 2017-08-30 NOTE — Assessment & Plan Note (Signed)
History of hyperlipidemia on statin therapy followed by his PCP 

## 2017-08-30 NOTE — Assessment & Plan Note (Signed)
History of abdominal aortic aneurysm status post endoluminal stent graft 10/09 in [place]. Apparently his last ultrasound performed in February this year showed the aneurysm measuring 4.4 x 4.6. We will recheck abdominal ultrasound in February of next year.

## 2017-08-30 NOTE — Progress Notes (Signed)
08/30/2017 Vincent Robinson   07-01-1923  [id number]  Primary Physician Briscoe, Jannifer Rodney, MD Primary Cardiologist: Lorretta Harp MD Vincent Robinson, [place]  HPI:  Vincent Robinson is a 83 y.o. male married, father of 2, grandfather of 2 grandchildren originally from [place] who lives in [place] now be closer to his family. Retired Estate manager/land agent. He was referred by Dr. Doreene Nest for cardiovascular evaluation because of increasing dyspnea on exertion thought not to be a pulmonary etiology. He does have a history of hyperlipidemia. He's never had a heart attack or stroke. He had coronary artery bypass grafting 5-10 years ago with recath prior to his endoluminal stent graft 10/09 that showed patent grafts. He had an endoluminal stent graft for abdominal aortic aneurysm in [place] 10/09 with recent Doppler performed 2/18 revealed aortic dimensions of approximately 4.4 x 4.6 cm. Recently had a spontaneous pneumothorax that was treated conservatively as well as a diverticular bleed requiring significant blood transfusion. He is currently on continuous nasal oxygen over the last 2 months. He was begun on a diuretic which improved his breathing.   Current Meds  Medication Sig  . albuterol (PROVENTIL HFA;VENTOLIN HFA) 108 (90 Base) MCG/ACT inhaler Inhale 1 puff into the lungs 2 (two) times daily.  Marland Kitchen aspirin (ASPIRIN 81) 81 MG chewable tablet Chew 1 tablet (81 mg total) by mouth daily. Restart in 1 week  . benzonatate (TESSALON PERLES) 100 MG capsule Take 1 capsule (100 mg total) by mouth 3 (three) times daily as needed for cough.  . fluticasone (VERAMYST) 27.5 MCG/SPRAY nasal spray Place 1 spray into the nose daily.  . Fluticasone-Salmeterol (AIRDUO RESPICLICK 202/54) 270-62 MCG/ACT AEPB Inhale 1 puff into the lungs 2 (two) times daily.  . furosemide (LASIX) 40 MG tablet Take 1 tablet (40 mg total) by mouth daily.  Marland Kitchen levothyroxine (SYNTHROID, LEVOTHROID) 112 MCG tablet Take 112 mcg by mouth  daily before breakfast.  . montelukast (SINGULAIR) 10 MG tablet Take 1 tablet (10 mg total) by mouth at bedtime.  Marland Kitchen oxybutynin (DITROPAN) 5 MG tablet Take 5 mg by mouth every 8 (eight) hours as needed for bladder spasms.  . simvastatin (ZOCOR) 20 MG tablet Take 10 mg by mouth at bedtime.     No Known Allergies  Social History   Socioeconomic History  . Marital status: Married    Spouse name: Not on file  . Number of children: Not on file  . Years of education: Not on file  . Highest education level: Not on file  Social Needs  . Financial resource strain: Not on file  . Food insecurity - worry: Not on file  . Food insecurity - inability: Not on file  . Transportation needs - medical: Not on file  . Transportation needs - non-medical: Not on file  Occupational History  . Not on file  Tobacco Use  . Smoking status: Former Smoker    Packs/day: 0.50    Last attempt to quit: 08/06/1961    Years since quitting: 56.1  . Smokeless tobacco: Never Used  Substance and Sexual Activity  . Alcohol use: Not on file  . Drug use: Not on file  . Sexual activity: Not on file  Other Topics Concern  . Not on file  Social History Narrative  . Not on file     Review of Systems: General: negative for chills, fever, night sweats or weight changes.  Cardiovascular: negative for chest pain, dyspnea on exertion, edema, orthopnea, palpitations, paroxysmal nocturnal  dyspnea or shortness of breath Dermatological: negative for rash Respiratory: negative for cough or wheezing Urologic: negative for hematuria Abdominal: negative for nausea, vomiting, diarrhea, bright red blood per rectum, melena, or hematemesis Neurologic: negative for visual changes, syncope, or dizziness All other systems reviewed and are otherwise negative except as noted above.    Blood pressure 106/72, pulse 90, height 5' 7.5" (1.715 m), weight 184 lb (83.5 kg).  General appearance: alert and no distress Neck: no adenopathy,  no carotid bruit, no JVD, supple, symmetrical, trachea midline and thyroid not enlarged, symmetric, no tenderness/mass/nodules Lungs: clear to auscultation bilaterally Heart: regular rate and rhythm, S1, S2 normal, no murmur, click, rub or gallop Extremities: Trace bilateral lower extremity edema Pulses: 2+ and symmetric Skin: Skin color, texture, turgor normal. No rashes or lesions Neurologic: Alert and oriented X 3, normal strength and tone. Normal symmetric reflexes. Normal coordination and gait  EKG not performed today  ASSESSMENT AND PLAN:   AAA (abdominal aortic aneurysm) without rupture (HCC) History of abdominal aortic aneurysm status post endoluminal stent graft 10/09 in [place]. Apparently his last ultrasound performed in February this year showed the aneurysm measuring 4.4 x 4.6. We will recheck abdominal ultrasound in February of next year.  Coronary artery disease History of coronary artery disease status post bypass grafting 5 approximately 10 years ago in [place]. He apparently had a stress echo in 2017 that was normal and EF of 62%. He was cathed at the time of his endoluminal stent graft and found to have patent grafts. He denies chest pain. He has had increasing shortness of breath/dyspnea on exertion and is now oxygen dependent. Recently had a spontaneous pneumothorax which was treated conservatively. I'm going to get a 2-D echo for LV function and may consider functional study as well.  Hyperlipidemia History of hyperlipidemia on statin therapy followed by his PCP      Lorretta Harp MD [facility], [facility] 08/30/2017 4:21 PM

## 2017-09-10 ENCOUNTER — Ambulatory Visit (HOSPITAL_COMMUNITY): Payer: Medicare Other | Attending: Cardiology

## 2017-09-10 ENCOUNTER — Other Ambulatory Visit: Payer: Self-pay

## 2017-09-10 DIAGNOSIS — R0602 Shortness of breath: Secondary | ICD-10-CM | POA: Diagnosis present

## 2017-09-10 DIAGNOSIS — Z87891 Personal history of nicotine dependence: Secondary | ICD-10-CM | POA: Diagnosis not present

## 2017-09-10 DIAGNOSIS — I082 Rheumatic disorders of both aortic and tricuspid valves: Secondary | ICD-10-CM | POA: Diagnosis not present

## 2017-09-10 DIAGNOSIS — Z951 Presence of aortocoronary bypass graft: Secondary | ICD-10-CM | POA: Insufficient documentation

## 2017-09-10 DIAGNOSIS — I77810 Thoracic aortic ectasia: Secondary | ICD-10-CM | POA: Diagnosis not present

## 2017-09-25 ENCOUNTER — Ambulatory Visit (HOSPITAL_COMMUNITY)
Admission: RE | Admit: 2017-09-25 | Discharge: 2017-09-25 | Disposition: A | Discharge status: Home / Self Care | Payer: Medicare Other | Source: Ambulatory Visit | Attending: Cardiovascular Disease | Admitting: Cardiovascular Disease

## 2017-09-25 DIAGNOSIS — I714 Abdominal aortic aneurysm, without rupture, unspecified: Secondary | ICD-10-CM

## 2017-09-27 ENCOUNTER — Other Ambulatory Visit: Payer: Self-pay | Admitting: Cardiovascular Disease

## 2017-09-27 DIAGNOSIS — I714 Abdominal aortic aneurysm, without rupture, unspecified: Secondary | ICD-10-CM

## 2017-10-02 ENCOUNTER — Ambulatory Visit: Payer: Medicare Other | Admitting: Cardiovascular Disease

## 2017-10-11 ENCOUNTER — Ambulatory Visit: Payer: Medicare Other | Admitting: Cardiovascular Disease

## 2017-10-11 ENCOUNTER — Ambulatory Visit (INDEPENDENT_AMBULATORY_CARE_PROVIDER_SITE_OTHER): Payer: Medicare Other | Admitting: Cardiovascular Disease

## 2017-10-11 ENCOUNTER — Encounter: Payer: Self-pay | Admitting: Cardiovascular Disease

## 2017-10-11 VITALS — BP 115/76 | HR 83 | Ht 68.0 in | Wt 188.2 lb

## 2017-10-11 DIAGNOSIS — R0989 Other specified symptoms and signs involving the circulatory and respiratory systems: Secondary | ICD-10-CM | POA: Diagnosis not present

## 2017-10-11 DIAGNOSIS — I2510 Atherosclerotic heart disease of native coronary artery without angina pectoris: Secondary | ICD-10-CM

## 2017-10-11 DIAGNOSIS — R0602 Shortness of breath: Secondary | ICD-10-CM

## 2017-10-11 DIAGNOSIS — I272 Pulmonary hypertension, unspecified: Secondary | ICD-10-CM | POA: Insufficient documentation

## 2017-10-11 DIAGNOSIS — I714 Abdominal aortic aneurysm, without rupture, unspecified: Secondary | ICD-10-CM

## 2017-10-11 DIAGNOSIS — E7800 Pure hypercholesterolemia, unspecified: Secondary | ICD-10-CM | POA: Diagnosis not present

## 2017-10-11 DIAGNOSIS — I350 Nonrheumatic aortic (valve) stenosis: Secondary | ICD-10-CM

## 2017-10-11 MED ORDER — FUROSEMIDE 40 MG PO TABS: 40.0000 mg | ORAL_TABLET | Freq: Two times a day (BID) | ORAL | 2 refills | Status: DC

## 2017-10-11 NOTE — Assessment & Plan Note (Signed)
History of abdominal aortic aneurysm status post endoluminal stent grafting recent Doppler studies show aortic dimensions in the 3 cm range.

## 2017-10-11 NOTE — Assessment & Plan Note (Signed)
[name] had a recent 2-D echo performed 09/10/17 revealing mild to moderate aortic stenosis. This aortic valve area was 1.16 cm with a peak gradient of 26 mmHg and a mean of 14.

## 2017-10-11 NOTE — Addendum Note (Signed)
Addended by: Therisa Doyne on: 10/11/2017 02:14 PM   Modules accepted: Orders

## 2017-10-11 NOTE — Addendum Note (Signed)
Addended by: Therisa Doyne on: 10/11/2017 02:19 PM   Modules accepted: Orders

## 2017-10-11 NOTE — Progress Notes (Signed)
10/11/2017 Vincent Robinson   04-02-1923  [id number]  Primary Physician Briscoe, Jannifer Rodney, MD Primary Cardiologist: Lorretta Harp MD Vincent Robinson, [place]  HPI:  Vincent Robinson is a 83 y.o.  married, father of 2, grandfather of 2 grandchildren originally from [place] who lives in [place] now be closer to his family. He is a retired Estate manager/land agent. He was referred by Dr. Doreene Nest for cardiovascular evaluation because of increasing dyspnea on exertion thought not to be a pulmonary etiology. I last saw him in the office 08/30/17 He does have a history of hyperlipidemia. He's never had a heart attack or stroke. He had coronary artery bypass grafting 5-10 years ago with recath prior to his endoluminal stent graft 10/09 that showed patent grafts. He had an endoluminal stent graft for abdominal aortic aneurysm in [place] 10/09 with recent Doppler performed 2/18 revealed aortic dimensions of approximately 4.4 x 4.6 cm. Recently had a spontaneous pneumothorax that was treated conservatively as well as a diverticular bleed requiring significant blood transfusion. He is currently on continuous nasal oxygen over the last 2 months. He was begun on a diuretic which improved his breathing. Since I saw him about a month ago we obtained a 2-D echo that showed normal LV systolic function, mild to moderate aortic stenosis with severe pulmonary hypertension. Abdominal Dopplers revealed a abdominal aorta measuring 2.9 cm. He does relate increasing dyspnea on exertion despite supplemental oxygen. He does have 1-2+ pitting edema. There is also had a history of spontaneous pneumothorax on the right hear good breath sounds bilaterally. I'm going to obtain a PA and lateral chest x-ray, double diuretics and obtain a CT rule out chronic PE as well as a primary oncologic Myoview stress test to rule out an ischemic etiology. I'm referring him to the advanced heart failure clinic for further evaluation  .     Current Meds  Medication Sig  . albuterol (PROVENTIL HFA;VENTOLIN HFA) 108 (90 Base) MCG/ACT inhaler Inhale 1 puff into the lungs 2 (two) times daily.  Marland Kitchen aspirin (ASPIRIN 81) 81 MG chewable tablet Chew 1 tablet (81 mg total) by mouth daily. Restart in 1 week  . benzonatate (TESSALON PERLES) 100 MG capsule Take 1 capsule (100 mg total) by mouth 3 (three) times daily as needed for cough.  . fluticasone (VERAMYST) 27.5 MCG/SPRAY nasal spray Place 1 spray into the nose daily.  . Fluticasone-Salmeterol (AIRDUO RESPICLICK 885/02) 774-12 MCG/ACT AEPB Inhale 1 puff into the lungs 2 (two) times daily.  . furosemide (LASIX) 40 MG tablet Take 1 tablet (40 mg total) by mouth daily.  Marland Kitchen levothyroxine (SYNTHROID, LEVOTHROID) 112 MCG tablet Take 112 mcg by mouth daily before breakfast.  . montelukast (SINGULAIR) 10 MG tablet Take 1 tablet (10 mg total) by mouth at bedtime.  Marland Kitchen oxybutynin (DITROPAN) 5 MG tablet Take 5 mg by mouth every 8 (eight) hours as needed for bladder spasms.  . simvastatin (ZOCOR) 20 MG tablet Take 10 mg by mouth at bedtime.     No Known Allergies  Social History   Socioeconomic History  . Marital status: Married    Spouse name: Not on file  . Number of children: Not on file  . Years of education: Not on file  . Highest education level: Not on file  Social Needs  . Financial resource strain: Not on file  . Food insecurity - worry: Not on file  . Food insecurity - inability: Not on file  . Transportation needs -  medical: Not on file  . Transportation needs - non-medical: Not on file  Occupational History  . Not on file  Tobacco Use  . Smoking status: Former Smoker    Packs/day: 0.50    Last attempt to quit: 08/06/1961    Years since quitting: 56.2  . Smokeless tobacco: Never Used  Substance and Sexual Activity  . Alcohol use: Not on file  . Drug use: Not on file  . Sexual activity: Not on file  Other Topics Concern  . Not on file  Social History Narrative   . Not on file     Review of Systems: General: negative for chills, fever, night sweats or weight changes.  Cardiovascular: negative for chest pain, dyspnea on exertion, edema, orthopnea, palpitations, paroxysmal nocturnal dyspnea or shortness of breath Dermatological: negative for rash Respiratory: negative for cough or wheezing Urologic: negative for hematuria Abdominal: negative for nausea, vomiting, diarrhea, bright red blood per rectum, melena, or hematemesis Neurologic: negative for visual changes, syncope, or dizziness All other systems reviewed and are otherwise negative except as noted above.    Blood pressure 115/76, pulse 83, height 5\' 8"  (1.727 m), weight 188 lb 3.2 oz (85.4 kg), SpO2 (!) 84 %.  General appearance: alert and no distress Neck: no adenopathy, no carotid bruit, no JVD, supple, symmetrical, trachea midline and thyroid not enlarged, symmetric, no tenderness/mass/nodules Lungs: clear to auscultation bilaterally Heart: 2/6 outflow tract murmur consistent with aortic stenosis. Extremities: 1-2+ pitting edema bilaterally Pulses: 2+ and symmetric Skin: Skin color, texture, turgor normal. No rashes or lesions Neurologic: Alert and oriented X 3, normal strength and tone. Normal symmetric reflexes. Normal coordination and gait  EKG not performed today  ASSESSMENT AND PLAN:   Hyperlipidemia History of hyperlipidemia on statin therapy followed by his PCP  Coronary artery disease History of CAD status post coronary artery bypass grafting 5 approximately 10 years ago with repeat catheterization prior to his endoluminal stent graft 10/09 revealing patent grafts. Patient really denies chest pain.  AAA (abdominal aortic aneurysm) without rupture (HCC) History of abdominal aortic aneurysm status post endoluminal stent grafting recent Doppler studies show aortic dimensions in the 3 cm range.  Pulmonary hypertension, unspecified [facility]) Vincent Robinson had a 2-D echo performed  09/10/17 revealing normal LV systolic function, mild to moderate aortic stenosis. Pressures of 85 mmHg. He is severely dyspneic requiring supplemental oxygen. I'm going to refer him to the heart failure clinic for further evaluation.  Aortic stenosis, moderate Vincent Robinson had a recent 2-D echo performed 09/10/17 revealing mild to moderate aortic stenosis. This aortic valve area was 1.16 cm with a peak gradient of 26 mmHg and a mean of 14.      Lorretta Harp MD [facility], [facility] 10/11/2017 2:05 PM

## 2017-10-11 NOTE — Patient Instructions (Addendum)
Medication Instructions: Increase Lasix to 40 mg twice daily.   Labwork: Your physician recommends that you return for lab work in: 1 week, BMET.   Testing/Procedures: A chest x-ray takes a picture of the organs and structures inside the chest, including the heart, lungs, and blood vessels. This test can show several things, including, whether the heart is enlarges; whether fluid is building up in the lungs; and whether pacemaker / defibrillator leads are still in place.  Your physician has requested that you have a lexiscan myoview. For further information please visit [email or web address]. Please follow instruction sheet, as given.  Please schedule VQ scan at [facility].   Follow-Up: You have been referred to the Heart Failure Clinic for Pulmonary Hypertension.  Your physician recommends that you schedule a follow-up appointment in: 3 months with Dr. Gwenlyn Found.  If you need a refill on your cardiac medications before your next appointment, please call your pharmacy.

## 2017-10-11 NOTE — Assessment & Plan Note (Signed)
History of CAD status post coronary artery bypass grafting 5 approximately 10 years ago with repeat catheterization prior to his endoluminal stent graft 10/09 revealing patent grafts. Patient really denies chest pain.

## 2017-10-11 NOTE — Assessment & Plan Note (Signed)
History of hyperlipidemia on statin therapy followed by his PCP 

## 2017-10-11 NOTE — Assessment & Plan Note (Signed)
Vincent Robinson had a 2-D echo performed 09/10/17 revealing normal LV systolic function, mild to moderate aortic stenosis. Pressures of 85 mmHg. He is severely dyspneic requiring supplemental oxygen. I'm going to refer him to the heart failure clinic for further evaluation.

## 2017-10-17 ENCOUNTER — Telehealth (HOSPITAL_COMMUNITY): Payer: Self-pay

## 2017-10-17 ENCOUNTER — Telehealth: Payer: Self-pay | Admitting: Cardiovascular Disease

## 2017-10-17 NOTE — Telephone Encounter (Signed)
Encounter complete. 

## 2017-10-17 NOTE — Telephone Encounter (Signed)
Messages sent to admin pool and nuclear med RN to request f/u calls to patient.

## 2017-10-17 NOTE — Telephone Encounter (Signed)
Spoke w patient. He had questions r/t admin time of BID lasix and I addressed these.  Also had question regarding upcoming pulmonary scan and stress test. Reviewed concerns r/t where to go for these tests and clarified locations for these. He voiced his understanding.  He has related questions regarding status of his HF clinic referral (order in system, pt not scheduled yet - will send msg to Admin pool) & his stress test (regarding preprocedure instructions - will send msg to [place]rene [place] RN).

## 2017-10-17 NOTE — Telephone Encounter (Signed)
New message  Patient calling with question on how to take dosage of Lasix. Please call  Pt c/o medication issue:  1. Name of Medication: furosemide (LASIX) 40 MG tablet  2. How are you currently taking this medication (dosage and times per day)? As prescribed  3. Are you having a reaction (difficulty breathing--STAT)? No  4. What is your medication issue? Pt has questions on how to take medication

## 2017-10-18 ENCOUNTER — Ambulatory Visit (HOSPITAL_COMMUNITY)
Admission: RE | Admit: 2017-10-18 | Discharge: 2017-10-18 | Disposition: A | Discharge status: Home / Self Care | Payer: Medicare Other | Source: Ambulatory Visit | Attending: Cardiovascular Disease | Admitting: Cardiovascular Disease

## 2017-10-18 DIAGNOSIS — I272 Pulmonary hypertension, unspecified: Secondary | ICD-10-CM | POA: Diagnosis present

## 2017-10-18 DIAGNOSIS — Z951 Presence of aortocoronary bypass graft: Secondary | ICD-10-CM | POA: Insufficient documentation

## 2017-10-18 DIAGNOSIS — R0989 Other specified symptoms and signs involving the circulatory and respiratory systems: Secondary | ICD-10-CM | POA: Diagnosis present

## 2017-10-18 DIAGNOSIS — I517 Cardiomegaly: Secondary | ICD-10-CM | POA: Diagnosis not present

## 2017-10-18 DIAGNOSIS — J849 Interstitial pulmonary disease, unspecified: Secondary | ICD-10-CM | POA: Diagnosis not present

## 2017-10-18 MED ORDER — TECHNETIUM TC 99M DIETHYLENETRIAME-PENTAACETIC ACID
32.0000 | Freq: Once | INTRAVENOUS | Status: AC | PRN
  Administered 2017-10-18: 32 via RESPIRATORY_TRACT

## 2017-10-18 MED ORDER — TECHNETIUM TO 99M ALBUMIN AGGREGATED
4.3200 | Freq: Once | INTRAVENOUS | Status: AC | PRN
  Administered 2017-10-18: 4.32 via INTRAVENOUS

## 2017-10-21 ENCOUNTER — Telehealth: Payer: Self-pay | Admitting: Cardiovascular Disease

## 2017-10-21 NOTE — Telephone Encounter (Signed)
New Message  Pt call requesting to speak with RN about the test on 1/2. Pt states he is confused. He states he thought he completed the test already. Pt would like to discuss to clear up confusion. Please call back to discuss

## 2017-10-23 ENCOUNTER — Ambulatory Visit (HOSPITAL_COMMUNITY)
Admission: RE | Admit: 2017-10-23 | Discharge: 2017-10-23 | Disposition: A | Discharge status: Home / Self Care | Payer: Medicare Other | Source: Ambulatory Visit | Attending: Internal Medicine | Admitting: Internal Medicine

## 2017-10-23 DIAGNOSIS — Z955 Presence of coronary angioplasty implant and graft: Secondary | ICD-10-CM | POA: Diagnosis not present

## 2017-10-23 DIAGNOSIS — Z951 Presence of aortocoronary bypass graft: Secondary | ICD-10-CM | POA: Diagnosis not present

## 2017-10-23 DIAGNOSIS — E039 Hypothyroidism, unspecified: Secondary | ICD-10-CM | POA: Diagnosis not present

## 2017-10-23 DIAGNOSIS — R0602 Shortness of breath: Secondary | ICD-10-CM | POA: Diagnosis not present

## 2017-10-23 DIAGNOSIS — J449 Chronic obstructive pulmonary disease, unspecified: Secondary | ICD-10-CM | POA: Diagnosis not present

## 2017-10-23 DIAGNOSIS — I714 Abdominal aortic aneurysm, without rupture: Secondary | ICD-10-CM | POA: Diagnosis not present

## 2017-10-23 DIAGNOSIS — R079 Chest pain, unspecified: Secondary | ICD-10-CM | POA: Insufficient documentation

## 2017-10-23 DIAGNOSIS — I2510 Atherosclerotic heart disease of native coronary artery without angina pectoris: Secondary | ICD-10-CM | POA: Insufficient documentation

## 2017-10-23 DIAGNOSIS — Z9981 Dependence on supplemental oxygen: Secondary | ICD-10-CM | POA: Diagnosis not present

## 2017-10-23 DIAGNOSIS — G4730 Sleep apnea, unspecified: Secondary | ICD-10-CM | POA: Insufficient documentation

## 2017-10-23 DIAGNOSIS — Z87891 Personal history of nicotine dependence: Secondary | ICD-10-CM | POA: Insufficient documentation

## 2017-10-23 LAB — MYOCARDIAL PERFUSION IMAGING
CHL CUP NUCLEAR SSS: 5
LV dias vol: 83 mL (ref 62–150)
LVSYSVOL: 35 mL
NUC STRESS TID: 1.17
Peak HR: 87 {beats}/min
Rest HR: 79 {beats}/min
SDS: 1
SRS: 4

## 2017-10-23 MED ORDER — REGADENOSON 0.4 MG/5ML IV SOLN
0.4000 mg | Freq: Once | INTRAVENOUS | Status: AC
  Administered 2017-10-23: 0.4 mg via INTRAVENOUS

## 2017-10-23 MED ORDER — TECHNETIUM TC 99M TETROFOSMIN IV KIT
10.3000 | PACK | Freq: Once | INTRAVENOUS | Status: AC | PRN
  Administered 2017-10-23: 10.3 via INTRAVENOUS
  Filled 2017-10-23: qty 11

## 2017-10-23 MED ORDER — TECHNETIUM TC 99M TETROFOSMIN IV KIT
31.6000 | PACK | Freq: Once | INTRAVENOUS | Status: AC | PRN
  Administered 2017-10-23: 31.6 via INTRAVENOUS
  Filled 2017-10-23: qty 32

## 2017-10-25 ENCOUNTER — Telehealth: Payer: Self-pay | Admitting: Cardiovascular Disease

## 2017-10-25 NOTE — Telephone Encounter (Signed)
New message ° °Pt verbalized that she is calling for RN °

## 2017-10-25 NOTE — Telephone Encounter (Signed)
Returned call to patient.Cxr and myoview results given.Stated he is still sob.Spoke to [place] appointment with Dr.Bensimhon moved up to 11/11/17 at 9:40 am.

## 2017-10-29 NOTE — Telephone Encounter (Signed)
Left detailed message --ok per DPR-- explaining all tests ordered and asked to call back if any questions.

## 2017-11-02 ENCOUNTER — Other Ambulatory Visit: Payer: Self-pay | Admitting: Pulmonary Disease

## 2017-11-11 ENCOUNTER — Encounter (HOSPITAL_COMMUNITY): Payer: Medicare Other | Admitting: Internal Medicine

## 2017-11-22 ENCOUNTER — Other Ambulatory Visit: Payer: Self-pay | Admitting: Pulmonary Disease

## 2017-11-27 ENCOUNTER — Encounter (HOSPITAL_COMMUNITY): Payer: Medicare Other | Admitting: Internal Medicine

## 2017-12-02 ENCOUNTER — Encounter: Payer: Self-pay | Admitting: Pulmonary Disease

## 2017-12-02 ENCOUNTER — Ambulatory Visit (INDEPENDENT_AMBULATORY_CARE_PROVIDER_SITE_OTHER): Payer: Medicare Other | Admitting: Pulmonary Disease

## 2017-12-02 VITALS — BP 120/72 | HR 91 | Ht 67.0 in | Wt 187.0 lb

## 2017-12-02 DIAGNOSIS — J449 Chronic obstructive pulmonary disease, unspecified: Secondary | ICD-10-CM

## 2017-12-02 DIAGNOSIS — J849 Interstitial pulmonary disease, unspecified: Secondary | ICD-10-CM | POA: Diagnosis not present

## 2017-12-02 DIAGNOSIS — G4733 Obstructive sleep apnea (adult) (pediatric): Secondary | ICD-10-CM | POA: Diagnosis not present

## 2017-12-02 NOTE — Patient Instructions (Signed)
You will need to use the fluticasone-salmeterol twice daily.  Use the Proventil as needed We will try to get download from your BiPAP machine to determine if you are getting adequate pressures Follow-up in 3 months

## 2017-12-02 NOTE — Progress Notes (Signed)
Vincent [place]    [id number]    09/09/1923  Primary Care Physician:Briscoe, Jannifer Rodney, MD  Referring Physician: Katherina Mires, MD [place], [place] [address]  Chief complaint:   Follow up for COPD Mild ILD, post inflammatory scarring Spontaneous pneumothorax July 2018 > resolved  HPI: [name] is 84 year old with past history of COPD, hypersomnia with sleep apnea, allergic rhinitis, cough variant asthma. He recently moved from [place] to be close to his family and wants to establish care here. He is chief complaints of dyspnea on exertion. He has history of sleep apnea on BiPAP at settings of 20/6. This is report of CT of the chest from [place] which shows calcified nodule, chronic scarring. He is a veteran of [place] in the [employer]. 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 He moved to [place] in 1956. Apparently his immigration was delayed due to a lung abnormality on imaging. He was eventually cleared. He does not have any history of TB, exposure to TB. He's had an episode of influenza while in [place].  Hospitalized in July, 2018 for a spontaneous pneumothorax that was treated conservatively. He was again readmitted in August 2018 for lower GI bleed. During this admission he was noted to be in acute hypoxic respiratory failure secondary to diastolic heart failure. He improved with diuresis.  Interim history: Seen by primary care doctor for bronchitis and prescribed doxycycline in January 2019.  Seen by Dr. Gwenlyn Robinson, cardiology and diuretics increased Echocardiogram shows severe pulmonary hypertension, he has a appointment with advanced heart failure this week.  Outpatient Encounter Medications as of 12/02/2017  Medication Sig  . albuterol (PROVENTIL HFA;VENTOLIN HFA) 108 (90 Base) MCG/ACT inhaler Inhale 1 puff into the lungs 2 (two) times daily.  Marland Kitchen aspirin (ASPIRIN  81) 81 MG chewable tablet Chew 1 tablet (81 mg total) by mouth daily. Restart in 1 week  . benzonatate (TESSALON PERLES) 100 MG capsule Take 1 capsule (100 mg total) by mouth 3 (three) times daily as needed for cough.  . fluticasone (VERAMYST) 27.5 MCG/SPRAY nasal spray Place 1 spray into the nose daily.  . Fluticasone-Salmeterol (AIRDUO RESPICLICK 979/89) 211-94 MCG/ACT AEPB Inhale 1 puff into the lungs 2 (two) times daily.  . furosemide (LASIX) 40 MG tablet Take 1 tablet (40 mg total) by mouth 2 (two) times daily.  Marland Kitchen levothyroxine (SYNTHROID, LEVOTHROID) 112 MCG tablet Take 112 mcg by mouth daily before breakfast.  . montelukast (SINGULAIR) 10 MG tablet TAKE ONE TABLET BY MOUTH ONE TIME DAILY AT BEDTIME  . oxybutynin (DITROPAN) 5 MG tablet Take 5 mg by mouth every 8 (eight) hours as needed for bladder spasms.  . simvastatin (ZOCOR) 20 MG tablet Take 10 mg by mouth at bedtime.   No facility-administered encounter medications on file as of 12/02/2017.     Allergies as of 12/02/2017  . (No Known Allergies)    Past Medical History:  Diagnosis Date  . AAA (abdominal aortic aneurysm) ([facility])   . Asthma   . Basal cell carcinoma   . COPD (chronic obstructive pulmonary disease) ([facility])   . Coronary artery disease   . Hyperlipidemia   . Hypothyroidism   . Skin cancer 2017  . Sleep apnea     Past Surgical History:  Procedure Laterality Date  . bleeding ulcer  2016  . BYPASS AXILLA/BRACHIAL ARTERY  1998  . colon bleed  2012  . COLONOSCOPY N/A 06/07/2017   Procedure: COLONOSCOPY;  Surgeon: Carol Ada, MD;  Location: [facility];  Service: Endoscopy;  Laterality: N/A;  . DEBRIDMENT OF DECUBITUS ULCER  2002  . THORACIC AORTA STENT  2009    Family History  Problem Relation Age of Onset  . Emphysema Mother   . Stroke Father   . Movement disorder Sister   . Aneurysm Brother        Thoracic aorta  . Macular degeneration Brother     Social History   Socioeconomic History  . Marital  status: Married    Spouse name: Not on file  . Number of children: Not on file  . Years of education: Not on file  . Highest education level: Not on file  Social Needs  . Financial resource strain: Not on file  . Food insecurity - worry: Not on file  . Food insecurity - inability: Not on file  . Transportation needs - medical: Not on file  . Transportation needs - non-medical: Not on file  Occupational History  . Not on file  Tobacco Use  . Smoking status: Former Smoker    Packs/day: 0.50    Last attempt to quit: 08/06/1961    Years since quitting: 56.3  . Smokeless tobacco: Never Used  Substance and Sexual Activity  . Alcohol use: Not on file  . Drug use: Not on file  . Sexual activity: Not on file  Other Topics Concern  . Not on file  Social History Narrative  . Not on file   Review of systems: Review of Systems  Constitutional: Negative for fever and chills.  HENT: Negative.   Eyes: Negative for blurred vision.  Respiratory: as per HPI  Cardiovascular: Negative for chest pain and palpitations.  Gastrointestinal: Negative for vomiting, diarrhea, blood per rectum. Genitourinary: Negative for dysuria, urgency, frequency and hematuria.  Musculoskeletal: Negative for myalgias, back pain and joint pain.  Skin: Negative for itching and rash.  Neurological: Negative for dizziness, tremors, focal weakness, seizures and loss of consciousness.  Endo/Heme/Allergies: Negative for environmental allergies.  Psychiatric/Behavioral: Negative for depression, suicidal ideas and hallucinations.  All other systems reviewed and are negative.  Physical Exam: Blood pressure 120/72, pulse 91, height 5\' 7"  (1.702 m), weight 187 lb (84.8 kg), SpO2 90 %. Gen:      No acute distress HEENT:  EOMI, sclera anicteric Neck:     No masses; no thyromegaly Lungs:    Clear to auscultation bilaterally; normal respiratory effort CV:         Regular rate and rhythm; no murmurs Abd:      + bowel sounds;  soft, non-tender; no palpable masses, no distension Ext:    No edema; adequate peripheral perfusion Skin:      Warm and dry; no rash Neuro: alert and oriented x 3 Psych: normal mood and affect  Data Reviewed: PFTs 02/28/16 FVC 3.37 (116%), FEV1 2.08 (108%), F/F 62 Mild obstructive airway disease, no broncho-dilator response  CT scan 07/15/15- 2 .3 x 1.7 cm right upper lobe calcified opacity likely to represent pleural , parenchymal scarring. Mild centrilobular, paraseptal emphysema. Subpleural reticulation, fibrosis representing mild chronic ILD High-resolution CT 09/04/16 - stable right upper lobe scarring, hyper aerated lungs. Minimal interstitial lung disease and peripheral unchanged compared to 2016. Chest x-ray 05/20/17 - moderate right apical pneumothorax Chest x-ray 06/04/17- resolution of right apical pneumothorax. Chronic interstitial opacities Chest x-ray 06/11/17- CHF with small bilateral effusions.  Chest x-ray 07/24/17-stable chronic fibrotic lung disease I have reviewed all images personally.  BiPAP download December 2017-January 2018 Time spent greater than 4 hours- 97% AHI 7.2  Assessment:  #1 COPD, asthmatic bronchitis. Continues on airduo twice daily, albuterol inhaler as needed and supplemental oxygen.    #2 Mild ILD, postinflammatory lung fibrosis Previous CTs have apparently shown a calcified RUL scarring and mild ILD. The repeat high res resolution CT shows stability of findings from 2016 - 2017. I believe this may be from prior infections and exposures during his time in [place] and during [place] war. We will continue to follow this. Repeat high res CT.   #3 OSA He continues on BiPAP with good compliance. Get download to check pressures.   #4 Heart failure, pulmonary hypertension Follow with cardiology and advanced heart failure service   Plan/Recommendations: - Continue airduo, O2, bipap - High res CT - Bipap download.  Marshell Garfinkel MD [facility] Pager [phone] 12/02/2017, 1:50 PM  CC: Vincent Mires, MD

## 2017-12-05 ENCOUNTER — Other Ambulatory Visit: Payer: Self-pay

## 2017-12-05 ENCOUNTER — Encounter (HOSPITAL_COMMUNITY): Payer: Self-pay | Admitting: Internal Medicine

## 2017-12-05 ENCOUNTER — Ambulatory Visit (HOSPITAL_COMMUNITY)
Admission: RE | Admit: 2017-12-05 | Discharge: 2017-12-05 | Disposition: A | Discharge status: Home / Self Care | Payer: Medicare Other | Source: Ambulatory Visit | Attending: Internal Medicine | Admitting: Internal Medicine

## 2017-12-05 ENCOUNTER — Encounter (HOSPITAL_COMMUNITY): Payer: Self-pay | Admitting: Cardiology

## 2017-12-05 ENCOUNTER — Other Ambulatory Visit (HOSPITAL_COMMUNITY): Payer: Self-pay | Admitting: *Deleted

## 2017-12-05 VITALS — BP 142/70 | HR 70 | Wt 188.5 lb

## 2017-12-05 DIAGNOSIS — J9621 Acute and chronic respiratory failure with hypoxia: Secondary | ICD-10-CM | POA: Diagnosis not present

## 2017-12-05 DIAGNOSIS — Z9889 Other specified postprocedural states: Secondary | ICD-10-CM | POA: Insufficient documentation

## 2017-12-05 DIAGNOSIS — J45991 Cough variant asthma: Secondary | ICD-10-CM | POA: Diagnosis not present

## 2017-12-05 DIAGNOSIS — Z83518 Family history of other specified eye disorder: Secondary | ICD-10-CM | POA: Insufficient documentation

## 2017-12-05 DIAGNOSIS — I2510 Atherosclerotic heart disease of native coronary artery without angina pectoris: Secondary | ICD-10-CM | POA: Diagnosis not present

## 2017-12-05 DIAGNOSIS — G4733 Obstructive sleep apnea (adult) (pediatric): Secondary | ICD-10-CM | POA: Insufficient documentation

## 2017-12-05 DIAGNOSIS — Z951 Presence of aortocoronary bypass graft: Secondary | ICD-10-CM | POA: Diagnosis not present

## 2017-12-05 DIAGNOSIS — Z823 Family history of stroke: Secondary | ICD-10-CM | POA: Diagnosis not present

## 2017-12-05 DIAGNOSIS — J449 Chronic obstructive pulmonary disease, unspecified: Secondary | ICD-10-CM | POA: Diagnosis not present

## 2017-12-05 DIAGNOSIS — I2781 Cor pulmonale (chronic): Secondary | ICD-10-CM | POA: Insufficient documentation

## 2017-12-05 DIAGNOSIS — J9611 Chronic respiratory failure with hypoxia: Secondary | ICD-10-CM | POA: Diagnosis not present

## 2017-12-05 DIAGNOSIS — Z836 Family history of other diseases of the respiratory system: Secondary | ICD-10-CM | POA: Diagnosis not present

## 2017-12-05 DIAGNOSIS — Z9981 Dependence on supplemental oxygen: Secondary | ICD-10-CM | POA: Insufficient documentation

## 2017-12-05 DIAGNOSIS — I714 Abdominal aortic aneurysm, without rupture: Secondary | ICD-10-CM | POA: Insufficient documentation

## 2017-12-05 DIAGNOSIS — Z85828 Personal history of other malignant neoplasm of skin: Secondary | ICD-10-CM | POA: Insufficient documentation

## 2017-12-05 DIAGNOSIS — I272 Pulmonary hypertension, unspecified: Secondary | ICD-10-CM

## 2017-12-05 DIAGNOSIS — Z8489 Family history of other specified conditions: Secondary | ICD-10-CM | POA: Insufficient documentation

## 2017-12-05 DIAGNOSIS — E039 Hypothyroidism, unspecified: Secondary | ICD-10-CM | POA: Diagnosis not present

## 2017-12-05 DIAGNOSIS — Z87891 Personal history of nicotine dependence: Secondary | ICD-10-CM | POA: Diagnosis not present

## 2017-12-05 DIAGNOSIS — E785 Hyperlipidemia, unspecified: Secondary | ICD-10-CM | POA: Insufficient documentation

## 2017-12-05 DIAGNOSIS — I060 Rheumatic aortic stenosis: Secondary | ICD-10-CM | POA: Insufficient documentation

## 2017-12-05 DIAGNOSIS — Z79899 Other long term (current) drug therapy: Secondary | ICD-10-CM | POA: Insufficient documentation

## 2017-12-05 NOTE — Patient Instructions (Signed)
Your physician has requested that you have a cardiac catheterization. Cardiac catheterization is used to diagnose and/or treat various heart conditions. Doctors may recommend this procedure for a number of different reasons. The most common reason is to evaluate chest pain. Chest pain can be a symptom of coronary artery disease (CAD), and cardiac catheterization can show whether plaque is narrowing or blocking your heart's arteries. This procedure is also used to evaluate the valves, as well as measure the blood flow and oxygen levels in different parts of your heart. For further information please visit [email or web address]. Please follow instruction sheet, as given.  Your physician recommends that you schedule a follow-up appointment in: 3 months with Dr Haroldine Laws

## 2017-12-05 NOTE — Progress Notes (Signed)
ADVANCED HF CLINIC CONSULT NOTE  Referring Physician: Dr. Gwenlyn Found  Primary Cardiologist: Dr. Gwenlyn Found   HPI:  Mr. Uecker is 84 year old with past history of COPD, hypersomnia with sleep apnea, allergic rhinitis, cough variant asthma. He recently moved from [place] to be close to his family and wants to establish care here. He is chief complaints of dyspnea on exertion. He has history of sleep apnea on BiPAP at settings of 20/6. This is report of CT of the chest from [place] which shows calcified nodule, chronic scarring. 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 He moved to [place] in 1956. Apparently his immigration was delayed due to a lung abnormality on imaging. He was eventually cleared. He does not have any history of TB, exposure to TB. He's had an episode of influenza while in [place].  Hospitalized in July, 2018 for a spontaneous pneumothorax that was treated conservatively. He was again readmitted in August 2018 for lower GI bleed (diverticular). During this admission he was noted to be in acute hypoxic respiratory failure secondary to diastolic heart failure. He improved with diuresis.  He had coronary artery bypass grafting in 1998. He had an endoluminal stent graft for abdominal aortic aneurysm in [place] 10/09 . Pre-op cath showed patent grafts.  Doppler performed 2/18 revealed aortic dimensions of approximately 4.4 x 4.6 cm.   Has been followed by Dr. Vaughan Browner. Smoked a little bit in remote past but quit a long time ago. No h/o connective tissue disease. Wears BIPAP for severe OSA. Has been on home O2 since 2017. Dr. Vaughan Browner has arranged for BIPAP titration and repeat hi-res CT. BiPAP download December 2017-January 2018. AHI 7.2  He was recently seen by Dr. Gwenlyn Found for progressive SOB. Echo showed severe PAH with RV failure. Says he can walk 100-200 feet then  has to stop. Says in 11/18 was able to lift boxes but can't do that now. No dizziness. No orthopnea or PND. Myoview 1/9 EF 58% no ischemia No known h/o DVT or PE. VQ 12/18 negative. Says breathing is better after lasix recently increased. Has pulse ox at home typically 93-94%  Echo 11/18 EF 60-65% grade I DD  RV severely dilated moderate TR RVSP 73mmHG   VQ 12/18 Normal perfusion lung scan.  Irregular peripheral ventilation in both lungs consistent with chronic parenchymal lung disease changes.  PFTs 02/28/16 FVC 3.37 (116%), FEV1 2.08 (108%), F/F 62 Mild obstructive airway disease, no broncho-dilator response  CT scan 07/15/15- 2 .3 x 1.7 cm right upper lobe calcified opacity likely to represent pleural , parenchymal scarring. Mild centrilobular, paraseptal emphysema. Subpleural reticulation, fibrosis representing mild chronic ILD High-resolution CT 09/04/16 - stable right upper lobe scarring, hyper aerated lungs. Minimal interstitial lung disease and peripheral unchanged compared to 2016.    Review of Systems: [y] = yes, [ ]  = no   General: Weight gain [ ] ; Weight loss [ ] ; Anorexia [ ] ; Fatigue [ ] ; Fever [ ] ; Chills [ ] ; Weakness [ ]   Cardiac: Chest pain/pressure [ ] ; Resting SOB [ ] ; Exertional SOB [ ] ; Orthopnea [ ] ; Pedal Edema [ ] ; Palpitations [ ] ; Syncope [ ] ; Presyncope [ ] ; Paroxysmal nocturnal dyspnea[ ]   Pulmonary: Cough [ ] ; Wheezing[ ] ; Hemoptysis[ ] ; Sputum [ ] ; Snoring [ ]   GI: Vomiting[ ] ; Dysphagia[ ] ; Melena[ ] ; Hematochezia [ ] ; Heartburn[ ] ;  Abdominal pain [ ] ; Constipation [ ] ; Diarrhea [ ] ; BRBPR [ ]   GU: Hematuria[ ] ; Dysuria [ ] ; Nocturia[ ]   Vascular: Pain in legs with walking [ ] ; Pain in feet with lying flat [ ] ; Non-healing sores [ ] ; Stroke [ ] ; TIA [ ] ; Slurred speech [ ] ;  Neuro: Headaches[ ] ; Vertigo[ ] ; Seizures[ ] ; Paresthesias[ ] ;Blurred vision [ ] ; Diplopia [ ] ; Vision changes [ ]   Ortho/Skin: Arthritis [ ] ; Joint pain [ ] ; Muscle pain [ ] ; Joint  swelling [ ] ; Back Pain [ ] ; Rash [ ]   Psych: Depression[ ] ; Anxiety[ ]   Heme: Bleeding problems [ ] ; Clotting disorders [ ] ; Anemia [ ]   Endocrine: Diabetes [ ] ; Thyroid dysfunction[ ]    Past Medical History:  Diagnosis Date  . AAA (abdominal aortic aneurysm) ([facility])   . Asthma   . Basal cell carcinoma   . COPD (chronic obstructive pulmonary disease) ([facility])   . Coronary artery disease   . Hyperlipidemia   . Hypothyroidism   . Skin cancer 2017  . Sleep apnea     Current Outpatient Medications  Medication Sig Dispense Refill  . albuterol (PROVENTIL HFA;VENTOLIN HFA) 108 (90 Base) MCG/ACT inhaler Inhale 1 puff into the lungs 2 (two) times daily. 1 Inhaler 2  . aspirin (ASPIRIN 81) 81 MG chewable tablet Chew 1 tablet (81 mg total) by mouth daily. Restart in 1 week    . benzonatate (TESSALON PERLES) 100 MG capsule Take 1 capsule (100 mg total) by mouth 3 (three) times daily as needed for cough. 90 capsule 1  . fluticasone (VERAMYST) 27.5 MCG/SPRAY nasal spray Place 1 spray into the nose daily.    . Fluticasone-Salmeterol (AIRDUO RESPICLICK 785/88) 502-77 MCG/ACT AEPB Inhale 1 puff into the lungs 2 (two) times daily. 1 each 11  . furosemide (LASIX) 40 MG tablet Take 1 tablet (40 mg total) by mouth 2 (two) times daily. 60 tablet 2  . levothyroxine (SYNTHROID, LEVOTHROID) 112 MCG tablet Take 112 mcg by mouth daily before breakfast.    . montelukast (SINGULAIR) 10 MG tablet TAKE ONE TABLET BY MOUTH ONE TIME DAILY AT BEDTIME 30 tablet 4  . oxybutynin (DITROPAN) 5 MG tablet Take 5 mg by mouth every 8 (eight) hours as needed for bladder spasms.    . simvastatin (ZOCOR) 20 MG tablet Take 10 mg by mouth at bedtime.  3   No current facility-administered medications for this encounter.     No Known Allergies    Social History   Socioeconomic History  . Marital status: Married    Spouse name: Not on file  . Number of children: Not on file  . Years of education: Not on file  . Highest  education level: Not on file  Social Needs  . Financial resource strain: Not on file  . Food insecurity - worry: Not on file  . Food insecurity - inability: Not on file  . Transportation needs - medical: Not on file  . Transportation needs - non-medical: Not on file  Occupational History  . Not on file  Tobacco Use  . Smoking status: Former Smoker    Packs/day: 0.50    Last attempt to quit: 08/06/1961    Years since quitting: 56.3  . Smokeless tobacco: Never Used  Substance and Sexual Activity  . Alcohol use: Not on file  . Drug use: Not on file  . Sexual activity: Not on file  Other Topics Concern  . Not on file  Social History Narrative  .  Not on file      Family History  Problem Relation Age of Onset  . Emphysema Mother   . Stroke Father   . Movement disorder Sister   . Aneurysm Brother        Thoracic aorta  . Macular degeneration Brother     Vitals:   12/05/17 1153  BP: (!) 142/70  Pulse: 70  SpO2: 93%  Weight: 188 lb 8 oz (85.5 kg)    PHYSICAL EXAM: General:  Elderly  Wearing O2. No respiratory difficulty HEENT: normal Neck: supple. no JVD. Carotids 2+ bilat; no bruits. No lymphadenopathy or thryomegaly appreciated. Cor: PMI nondisplaced. Regular rate & rhythm. 2/6 AS  2/6 TR Lungs: clear with decreased breath sounds  Abdomen: soft, nontender, nondistended. No hepatosplenomegaly. No bruits or masses. Good bowel sounds. Extremities: no cyanosis, clubbing, rash, no edema on RLE  Swollen L ankle  Neuro: alert & oriented x 3, cranial nerves grossly intact. moves all 4 extremities w/o difficulty. Affect pleasant.   ASSESSMENT & PLAN:  1. Pulmonary hypertension with cor pulmonale - echo, CT, PFTs and VQ studies reviewed personally - he has evidence of significant pulmonary HTN with severe RV failure. Etiology remains unclear. Symptoms improved with diuresis - he has had a very thorough w/u to date with Dr. Vaughan Browner and Dr. Gwenlyn Found - CT with evidence of ILD but  PFTs not that bad. VQ negative.  - suspect PH multifactorial but may very well have component of Group I disease - will plan RHC to assess severity and need for possible selective pulmonary vasodialtors - have suggested referral to Pulmonary rehab but he refuses  2. Chronic respiratory failure - evidence of mild ILD on CT. PFTs not too bad - has severe OSA on Bipap - continue supplemental O2 - managed by Pulmonary  3. CAD s/p CABG - no s/s ischemia. Recent Myoview 1/19 normal.  - Managed by Dr. Gwenlyn Found.   4. Aortic stenosis - moderate by echo 11/18  Glori Bickers, MD  10:17 PM

## 2017-12-05 NOTE — H&P (View-Only) (Signed)
ADVANCED [facility] CLINIC CONSULT NOTE  Referring Physician: Dr. Gwenlyn Found  Primary Cardiologist: Dr. Gwenlyn Found   HPI:  Vincent Robinson is 84 year old with past history of COPD, hypersomnia with sleep apnea, allergic rhinitis, cough variant asthma. He recently moved from [place] to be close to his family and wants to establish care here. He is chief complaints of dyspnea on exertion. He has history of sleep apnea on BiPAP at settings of 20/6. This is report of CT of the chest from [place] which shows calcified nodule, chronic scarring. 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 He moved to [place] in 1956. Apparently his immigration was delayed due to a lung abnormality on imaging. He was eventually cleared. He does not have any history of TB, exposure to TB. He's had an episode of influenza while in [place].  Hospitalized in July, 2018 for a spontaneous pneumothorax that was treated conservatively. He was again readmitted in August 2018 for lower GI bleed (diverticular). During this admission he was noted to be in acute hypoxic respiratory failure secondary to diastolic heart failure. He improved with diuresis.  He had coronary artery bypass grafting in 1998. He had an endoluminal stent graft for abdominal aortic aneurysm in [place] 10/09 . Pre-op cath showed patent grafts.  Doppler performed 2/18 revealed aortic dimensions of approximately 4.4 x 4.6 cm.   Has been followed by Dr. Vaughan Browner. Smoked a little bit in remote past but quit a long time ago. No h/o connective tissue disease. Wears BIPAP for severe OSA. Has been on home O2 since 2017. Dr. Vaughan Browner has arranged for BIPAP titration and repeat hi-res CT. BiPAP download December 2017-January 2018. AHI 7.2  He was recently seen by Dr. Gwenlyn Found for progressive SOB. Echo showed severe PAH with RV failure. Says he can walk 100-200 feet then  has to stop. Says in 11/18 was able to lift boxes but can't do that now. No dizziness. No orthopnea or PND. Myoview 1/9 EF 58% no ischemia No known h/o DVT or PE. VQ 12/18 negative. Says breathing is better after lasix recently increased. Has pulse ox at home typically 93-94%  Echo 11/18 EF 60-65% grade I DD  RV severely dilated moderate TR RVSP 38mmHG   VQ 12/18 Normal perfusion lung scan.  Irregular peripheral ventilation in both lungs consistent with chronic parenchymal lung disease changes.  PFTs 02/28/16 FVC 3.37 (116%), FEV1 2.08 (108%), F/F 62 Mild obstructive airway disease, no broncho-dilator response  CT scan 07/15/15- 2 .3 x 1.7 cm right upper lobe calcified opacity likely to represent pleural , parenchymal scarring. Mild centrilobular, paraseptal emphysema. Subpleural reticulation, fibrosis representing mild chronic ILD High-resolution CT 09/04/16 - stable right upper lobe scarring, hyper aerated lungs. Minimal interstitial lung disease and peripheral unchanged compared to 2016.    Review of Systems: [y] = yes, [ ]  = no   General: Weight gain [ ] ; Weight loss [ ] ; Anorexia [ ] ; Fatigue [ ] ; Fever [ ] ; Chills [ ] ; Weakness [ ]   Cardiac: Chest pain/pressure [ ] ; Resting SOB [ ] ; Exertional SOB [ ] ; Orthopnea [ ] ; Pedal Edema [ ] ; Palpitations [ ] ; Syncope [ ] ; Presyncope [ ] ; Paroxysmal nocturnal dyspnea[ ]   Pulmonary: Cough [ ] ; Wheezing[ ] ; Hemoptysis[ ] ; Sputum [ ] ; Snoring [ ]   GI: Vomiting[ ] ; Dysphagia[ ] ; Melena[ ] ; Hematochezia [ ] ; Heartburn[ ] ;  Abdominal pain [ ] ; Constipation [ ] ; Diarrhea [ ] ; BRBPR [ ]   GU: Hematuria[ ] ; Dysuria [ ] ; Nocturia[ ]   Vascular: Pain in legs with walking [ ] ; Pain in feet with lying flat [ ] ; Non-healing sores [ ] ; Stroke [ ] ; TIA [ ] ; Slurred speech [ ] ;  Neuro: Headaches[ ] ; Vertigo[ ] ; Seizures[ ] ; Paresthesias[ ] ;Blurred vision [ ] ; Diplopia [ ] ; Vision changes [ ]   Ortho/Skin: Arthritis [ ] ; Joint pain [ ] ; Muscle pain [ ] ; Joint  swelling [ ] ; Back Pain [ ] ; Rash [ ]   Psych: Depression[ ] ; Anxiety[ ]   Heme: Bleeding problems [ ] ; Clotting disorders [ ] ; Anemia [ ]   Endocrine: Diabetes [ ] ; Thyroid dysfunction[ ]    Past Medical History:  Diagnosis Date  . AAA (abdominal aortic aneurysm) ([place])   . Asthma   . Basal cell carcinoma   . COPD (chronic obstructive pulmonary disease) ([place])   . Coronary artery disease   . Hyperlipidemia   . Hypothyroidism   . Skin cancer 2017  . Sleep apnea     Current Outpatient Medications  Medication Sig Dispense Refill  . albuterol (PROVENTIL HFA;VENTOLIN HFA) 108 (90 Base) MCG/ACT inhaler Inhale 1 puff into the lungs 2 (two) times daily. 1 Inhaler 2  . aspirin (ASPIRIN 81) 81 MG chewable tablet Chew 1 tablet (81 mg total) by mouth daily. Restart in 1 week    . benzonatate (TESSALON PERLES) 100 MG capsule Take 1 capsule (100 mg total) by mouth 3 (three) times daily as needed for cough. 90 capsule 1  . fluticasone (VERAMYST) 27.5 MCG/SPRAY nasal spray Place 1 spray into the nose daily.    . Fluticasone-Salmeterol (AIRDUO RESPICLICK 097/35) 329-92 MCG/ACT AEPB Inhale 1 puff into the lungs 2 (two) times daily. 1 each 11  . furosemide (LASIX) 40 MG tablet Take 1 tablet (40 mg total) by mouth 2 (two) times daily. 60 tablet 2  . levothyroxine (SYNTHROID, LEVOTHROID) 112 MCG tablet Take 112 mcg by mouth daily before breakfast.    . montelukast (SINGULAIR) 10 MG tablet TAKE ONE TABLET BY MOUTH ONE TIME DAILY AT BEDTIME 30 tablet 4  . oxybutynin (DITROPAN) 5 MG tablet Take 5 mg by mouth every 8 (eight) hours as needed for bladder spasms.    . simvastatin (ZOCOR) 20 MG tablet Take 10 mg by mouth at bedtime.  3   No current facility-administered medications for this encounter.     No Known Allergies    Social History   Socioeconomic History  . Marital status: Married    Spouse name: Not on file  . Number of children: Not on file  . Years of education: Not on file  . Highest  education level: Not on file  Social Needs  . Financial resource strain: Not on file  . Food insecurity - worry: Not on file  . Food insecurity - inability: Not on file  . Transportation needs - medical: Not on file  . Transportation needs - non-medical: Not on file  Occupational History  . Not on file  Tobacco Use  . Smoking status: Former Smoker    Packs/day: 0.50    Last attempt to quit: 08/06/1961    Years since quitting: 56.3  . Smokeless tobacco: Never Used  Substance and Sexual Activity  . Alcohol use: Not on file  . Drug use: Not on file  . Sexual activity: Not on file  Other Topics Concern  . Not on file  Social History Narrative  .  Not on file      Family History  Problem Relation Age of Onset  . Emphysema Mother   . Stroke Father   . Movement disorder Sister   . Aneurysm Brother        Thoracic aorta  . Macular degeneration Brother     Vitals:   12/05/17 1153  BP: (!) 142/70  Pulse: 70  SpO2: 93%  Weight: 188 lb 8 oz (85.5 kg)    PHYSICAL EXAM: General:  Elderly  Wearing O2. No respiratory difficulty HEENT: normal Neck: supple. no JVD. Carotids 2+ bilat; no bruits. No lymphadenopathy or thryomegaly appreciated. Cor: PMI nondisplaced. Regular rate & rhythm. 2/6 AS  2/6 TR Lungs: clear with decreased breath sounds  Abdomen: soft, nontender, nondistended. No hepatosplenomegaly. No bruits or masses. Good bowel sounds. Extremities: no cyanosis, clubbing, rash, no edema on RLE  Swollen L ankle  Neuro: alert & oriented x 3, cranial nerves grossly intact. moves all 4 extremities w/o difficulty. Affect pleasant.   ASSESSMENT & PLAN:  1. Pulmonary hypertension with cor pulmonale - echo, CT, PFTs and VQ studies reviewed personally - he has evidence of significant pulmonary HTN with severe RV failure. Etiology remains unclear. Symptoms improved with diuresis - he has had a very thorough w/u to date with Dr. Vaughan Browner and Dr. Gwenlyn Found - CT with evidence of ILD but  PFTs not that bad. VQ negative.  - suspect PH multifactorial but may very well have component of Group I disease - will plan RHC to assess severity and need for possible selective pulmonary vasodialtors - have suggested referral to Pulmonary rehab but he refuses  2. Chronic respiratory failure - evidence of mild ILD on CT. PFTs not too bad - has severe OSA on Bipap - continue supplemental O2 - managed by Pulmonary  3. CAD s/p CABG - no s/s ischemia. Recent Myoview 1/19 normal.  - Managed by Dr. Gwenlyn Found.   4. Aortic stenosis - moderate by echo 11/18  Glori Bickers, MD  10:17 PM

## 2017-12-11 ENCOUNTER — Telehealth (HOSPITAL_COMMUNITY): Payer: Self-pay | Admitting: *Deleted

## 2017-12-11 ENCOUNTER — Encounter (HOSPITAL_COMMUNITY): Payer: Self-pay | Admitting: Internal Medicine

## 2017-12-11 ENCOUNTER — Encounter (HOSPITAL_COMMUNITY)
Admission: RE | Disposition: A | Discharge status: Home / Self Care | Payer: Self-pay | Source: Ambulatory Visit | Attending: Internal Medicine

## 2017-12-11 ENCOUNTER — Telehealth: Payer: Self-pay

## 2017-12-11 ENCOUNTER — Ambulatory Visit (HOSPITAL_COMMUNITY)
Admission: RE | Admit: 2017-12-11 | Discharge: 2017-12-11 | Disposition: A | Discharge status: Home / Self Care | Payer: Medicare Other | Source: Ambulatory Visit | Attending: Internal Medicine | Admitting: Internal Medicine

## 2017-12-11 DIAGNOSIS — Z9981 Dependence on supplemental oxygen: Secondary | ICD-10-CM | POA: Diagnosis not present

## 2017-12-11 DIAGNOSIS — Z79890 Hormone replacement therapy (postmenopausal): Secondary | ICD-10-CM | POA: Diagnosis not present

## 2017-12-11 DIAGNOSIS — J449 Chronic obstructive pulmonary disease, unspecified: Secondary | ICD-10-CM | POA: Insufficient documentation

## 2017-12-11 DIAGNOSIS — I2721 Secondary pulmonary arterial hypertension: Secondary | ICD-10-CM

## 2017-12-11 DIAGNOSIS — E785 Hyperlipidemia, unspecified: Secondary | ICD-10-CM | POA: Diagnosis not present

## 2017-12-11 DIAGNOSIS — Z87891 Personal history of nicotine dependence: Secondary | ICD-10-CM | POA: Insufficient documentation

## 2017-12-11 DIAGNOSIS — Z951 Presence of aortocoronary bypass graft: Secondary | ICD-10-CM | POA: Insufficient documentation

## 2017-12-11 DIAGNOSIS — I2510 Atherosclerotic heart disease of native coronary artery without angina pectoris: Secondary | ICD-10-CM | POA: Insufficient documentation

## 2017-12-11 DIAGNOSIS — J9621 Acute and chronic respiratory failure with hypoxia: Secondary | ICD-10-CM | POA: Diagnosis not present

## 2017-12-11 DIAGNOSIS — G4733 Obstructive sleep apnea (adult) (pediatric): Secondary | ICD-10-CM | POA: Diagnosis not present

## 2017-12-11 DIAGNOSIS — Z85828 Personal history of other malignant neoplasm of skin: Secondary | ICD-10-CM | POA: Insufficient documentation

## 2017-12-11 DIAGNOSIS — E039 Hypothyroidism, unspecified: Secondary | ICD-10-CM | POA: Diagnosis not present

## 2017-12-11 DIAGNOSIS — Z79899 Other long term (current) drug therapy: Secondary | ICD-10-CM | POA: Insufficient documentation

## 2017-12-11 DIAGNOSIS — I350 Nonrheumatic aortic (valve) stenosis: Secondary | ICD-10-CM | POA: Insufficient documentation

## 2017-12-11 DIAGNOSIS — I272 Pulmonary hypertension, unspecified: Secondary | ICD-10-CM

## 2017-12-11 DIAGNOSIS — J849 Interstitial pulmonary disease, unspecified: Secondary | ICD-10-CM

## 2017-12-11 DIAGNOSIS — Z7982 Long term (current) use of aspirin: Secondary | ICD-10-CM | POA: Diagnosis not present

## 2017-12-11 HISTORY — PX: RIGHT HEART CATH: CATH118263

## 2017-12-11 LAB — CBC
HEMATOCRIT: 44.1 % (ref 39.0–52.0)
Hemoglobin: 14.3 g/dL (ref 13.0–17.0)
MCH: 29.6 pg (ref 26.0–34.0)
MCHC: 32.4 g/dL (ref 30.0–36.0)
MCV: 91.3 fL (ref 78.0–100.0)
PLATELETS: 317 10*3/uL (ref 150–400)
RBC: 4.83 MIL/uL (ref 4.22–5.81)
RDW: 17.2 % — AB (ref 11.5–15.5)
WBC: 10.7 10*3/uL — ABNORMAL HIGH (ref 4.0–10.5)

## 2017-12-11 LAB — POCT I-STAT 3, VENOUS BLOOD GAS (G3P V)
ACID-BASE EXCESS: 1 mmol/L (ref 0.0–2.0)
Bicarbonate: 24.5 mmol/L (ref 20.0–28.0)
Bicarbonate: 25.6 mmol/L (ref 20.0–28.0)
O2 Saturation: 58 %
O2 Saturation: 58 %
PCO2 VEN: 38.5 mmHg — AB (ref 44.0–60.0)
PH VEN: 7.403 (ref 7.250–7.430)
PH VEN: 7.412 (ref 7.250–7.430)
PO2 VEN: 30 mmHg — AB (ref 32.0–45.0)
TCO2: 26 mmol/L (ref 22–32)
TCO2: 27 mmol/L (ref 22–32)
pCO2, Ven: 41 mmHg — ABNORMAL LOW (ref 44.0–60.0)
pO2, Ven: 30 mmHg — CL (ref 32.0–45.0)

## 2017-12-11 LAB — BASIC METABOLIC PANEL
Anion gap: 13 (ref 5–15)
BUN: 22 mg/dL — AB (ref 6–20)
CO2: 25 mmol/L (ref 22–32)
Calcium: 9.2 mg/dL (ref 8.9–10.3)
Chloride: 101 mmol/L (ref 101–111)
Creatinine, Ser: 1.16 mg/dL (ref 0.61–1.24)
GFR calc non Af Amer: 53 mL/min — ABNORMAL LOW (ref >60)
Glucose, Bld: 107 mg/dL — ABNORMAL HIGH (ref 65–99)
POTASSIUM: 4.1 mmol/L (ref 3.5–5.1)
SODIUM: 139 mmol/L (ref 135–145)

## 2017-12-11 LAB — PROTIME-INR
INR: 1.05
Prothrombin Time: 13.6 seconds (ref 11.4–15.2)

## 2017-12-11 SURGERY — RIGHT HEART CATH
Anesthesia: LOCAL

## 2017-12-11 MED ORDER — LIDOCAINE HCL (PF) 1 % IJ SOLN
INTRAMUSCULAR | Status: DC | PRN
  Administered 2017-12-11: 2 mL via INTRADERMAL

## 2017-12-11 MED ORDER — SODIUM CHLORIDE 0.9% FLUSH: 3.0000 mL | Freq: Two times a day (BID) | INTRAVENOUS | Status: DC

## 2017-12-11 MED ORDER — SILDENAFIL CITRATE 20 MG PO TABS: 20.0000 mg | ORAL_TABLET | Freq: Three times a day (TID) | ORAL | 3 refills | Status: DC

## 2017-12-11 MED ORDER — ASPIRIN 81 MG PO CHEW: 81.0000 mg | CHEWABLE_TABLET | ORAL | Status: DC

## 2017-12-11 MED ORDER — SODIUM CHLORIDE 0.9 % IV SOLN: 250.0000 mL | INTRAVENOUS | Status: DC | PRN

## 2017-12-11 MED ORDER — LIDOCAINE HCL (PF) 1 % IJ SOLN
INTRAMUSCULAR | Status: AC
  Filled 2017-12-11: qty 30

## 2017-12-11 MED ORDER — HEPARIN (PORCINE) IN NACL 2-0.9 UNIT/ML-% IJ SOLN
INTRAMUSCULAR | Status: AC
  Filled 2017-12-11: qty 500

## 2017-12-11 MED ORDER — HEPARIN (PORCINE) IN NACL 2-0.9 UNIT/ML-% IJ SOLN
INTRAMUSCULAR | Status: AC | PRN
  Administered 2017-12-11: 500 mL

## 2017-12-11 MED ORDER — SODIUM CHLORIDE 0.9 % IV SOLN
INTRAVENOUS | Status: DC
  Administered 2017-12-11: 11:00:00 via INTRAVENOUS

## 2017-12-11 MED ORDER — SODIUM CHLORIDE 0.9% FLUSH: 3.0000 mL | INTRAVENOUS | Status: DC | PRN

## 2017-12-11 SURGICAL SUPPLY — 8 items
CATH BALLN WEDGE 5F 110CM (CATHETERS) ×2 IMPLANT
PACK CARDIAC CATHETERIZATION (CUSTOM PROCEDURE TRAY) ×2 IMPLANT
SHEATH GLIDE SLENDER 4/5FR (SHEATH) ×4 IMPLANT
TRANSDUCER W/STOPCOCK (MISCELLANEOUS) ×2 IMPLANT
TUBING ART PRESS 72  MALE/FEM (TUBING) ×1
TUBING ART PRESS 72 MALE/FEM (TUBING) ×1 IMPLANT
TUBING CIL FLEX 10 FLL-RA (TUBING) ×2 IMPLANT
WIRE EMERALD 3MM-J .025X260CM (WIRE) ×2 IMPLANT

## 2017-12-11 NOTE — Discharge Instructions (Signed)
Pulmonary Artery Catheterization  Pulmonary artery catheterization is a procedure that is used to test blood movement through the heart and to monitor the heart's function. In this procedure, a thin, flexible tube (catheter) is passed into the right side of the heart and into the main artery that carries blood from your heart to your lungs (pulmonary artery). The procedure may be used to evaluate or help diagnose various problems, such as:   Heart failure.   Shock.   Leaky heart valves (valvular regurgitation).   Congenital heart disease.   Burns.   Kidney disease.   High blood pressure within the arteries in the lungs (pulmonary hypertension).   A buildup of fluid around the heart that prevents the heart from functioning normally (cardiac tamponade).   A disease that causes the heart muscle to become rigid (restrictive cardiomyopathy).   Abnormal blood flow between two areas of the heart (shunt).    After a heart attack, this procedure may be used to monitor for further problems and to see if medicines are working.  The procedure may be done in a cardiac catheterization lab or in an intensive care unit (ICU).  Tell a health care provider about:   Any allergies you have.   All medicines you are taking, including vitamins, herbs, eye drops, creams, and over-the-counter medicines.   Any problems you or family members have had with anesthetic medicines.   Any blood disorders you have.   Any surgeries you have had.   Any medical conditions you have.  What are the risks?  Generally, this is a safe procedure. However, problems may occur, including:   Bruising or bleeding at the catheter insertion site.   Injury to the vein where the catheter was inserted.   Puncture to the lung. This is a risk if neck or chest veins are used.    The following problems may also occur, but they are very rare:   Abnormal heart rhythms.   Low blood pressure.   Infection.   Cardiac tamponade.   Blocked blood vessel  caused by a blood clot or foreign material circulating in the blood (embolism). This can be caused by blood clots at the tip of the catheter.    What happens before the procedure?   Follow instructions from your health care provider about eating or drinking restrictions.   Ask your health care provider about:  ? Changing or stopping your regular medicines. This is especially important if you are taking diabetes medicines or blood thinners.  ? Taking medicines such as aspirin and ibuprofen. These medicines can thin your blood. Do not take these medicines before your procedure if your health care provider instructs you not to.  What happens during the procedure?   An IV tube will be inserted into one of your veins.   You may be given a medicine that helps you relax (sedative).   The area of your body that is chosen for insertion of the catheter will be cleaned. This is usually the neck or groin, but the insertion is sometimes done in another area.  ? You will be given a medicine that numbs this area (local anesthetic).  ? A small incision will be made in a vein in this area.   A catheter will be inserted through the incision and into the vein. The health care provider will carefully move the catheter into the upper chamber of the heart (right atrium). X-rays may be used to help guide the catheter to the right   place.   The catheter will be threaded through two heart valves (tricuspid valve and pulmonary valve) and placed into the pulmonary artery.   As soon as the catheter is in place, the blood pressure in the pulmonary artery will be measured.   During the procedure, your heart's rhythm will be watched constantly using an electrocardiogram (ECG).   The catheter will be removed after tests and monitoring have been completed.  The procedure may vary among health care providers and hospitals.  What happens after the procedure?   Your blood pressure, heart rate, breathing rate, and blood oxygen level will be  monitored often until the medicines you were given have worn off.  This information is not intended to replace advice given to you by your health care provider. Make sure you discuss any questions you have with your health care provider.  Document Released: 02/11/2007 Document Revised: 03/15/2016 Document Reviewed: 10/12/2014  Elsevier Interactive Patient Education  2018 [employer]

## 2017-12-11 NOTE — Interval H&P Note (Signed)
History and Physical Interval Note:  12/11/2017 1:22 PM  Vincent Robinson  has presented today for surgery, with the diagnosis of pulmonary hypertension  The various methods of treatment have been discussed with the patient and family. After consideration of risks, benefits and other options for treatment, the patient has consented to  Procedure(s): RIGHT HEART CATH (N/A) as a surgical intervention .  The patient's history has been reviewed, patient examined, no change in status, stable for surgery.  I have reviewed the patient's chart and labs.  Questions were answered to the patient's satisfaction.     [name]

## 2017-12-11 NOTE — Telephone Encounter (Signed)
Per Dr. Vaughan Browner- after further evaluation of pt's chart, please order CTHR.  lmtcb x1 for pt

## 2017-12-11 NOTE — Telephone Encounter (Signed)
Per Dr Haroldine Laws pt needs Sildenafil 20 mg TID, he discussed with pt and family after cath today, rx sent in

## 2017-12-16 NOTE — Telephone Encounter (Signed)
Pt is aware of below message and voiced his understanding.  Pt states he had a recent heart catheterization with Dr. Haroldine Laws. Pt has requested that Dr. Vaughan Browner review these results before ordering CTHR.  Dr. Vaughan Browner please advise. Thanks.

## 2017-12-17 NOTE — Telephone Encounter (Addendum)
CT has been ordered. Pt is aware and voiced his understanding. Nothing further is needed.

## 2017-12-17 NOTE — Addendum Note (Signed)
Addended by: Maryanna Shape A on: 12/17/2017 03:56 PM   Modules accepted: Orders

## 2017-12-17 NOTE — Telephone Encounter (Signed)
Yes. I would like to proceed with the CT high res. thanks

## 2017-12-18 ENCOUNTER — Telehealth: Payer: Self-pay | Admitting: Cardiovascular Disease

## 2017-12-18 ENCOUNTER — Telehealth: Payer: Self-pay | Admitting: Pulmonary Disease

## 2017-12-18 NOTE — Telephone Encounter (Signed)
New Message  Pt c/o medication issue:  1. Name of Medication: furosemide (LASIX) 40 MG tablet  2. How are you currently taking this medication (dosage and times per day)? Take 1 tablet (40 mg total) by mouth 2 (two) times daily.  3. Are you having a reaction (difficulty breathing--STAT)? no  4. What is your medication issue? Pt wants to know if he can go back to taking once a day due to [place] putting him on other medications. Please call

## 2017-12-18 NOTE — Telephone Encounter (Signed)
ATC patient, he did not answer. VM was not setup. Will wait for patient to call back.   Looked in patient's chart from when the CT scan had been ordered, did not see if Dr. Vaughan Browner wanted to see the patient after the CT scan or still wanted the patient to follow up in 3 months (May 2019).

## 2017-12-18 NOTE — Telephone Encounter (Signed)
Attempted to call pt to discuss, no answer and no VM

## 2017-12-19 MED ORDER — FUROSEMIDE 40 MG PO TABS: 40.0000 mg | ORAL_TABLET | Freq: Every day | ORAL | 2 refills | Status: DC

## 2017-12-19 NOTE — Telephone Encounter (Signed)
Spoke w/pt, he states he feels like 1 lasix a day is enough.  He states he does not have any swelling anymore.  He denies lightheadedness/dizziness.    Discussed w/Andy Chalmers Cater, PA he states ok for pt to decrease Lasix to 40 mg daily and can take extra tab as needed.  Pt aware, agreeable and verbalizes understanding.

## 2017-12-19 NOTE — Telephone Encounter (Signed)
Called and spoke to pt. Informed pt that per Dr. Matilde Bash OV note pt should have CT chest and then follow back up in 3 months but to call back sooner if there are any issues. Pt verbalized understanding and denied any further questions or concerns at this time.

## 2017-12-25 ENCOUNTER — Other Ambulatory Visit: Payer: Medicare Other

## 2017-12-26 ENCOUNTER — Inpatient Hospital Stay: Admission: RE | Admit: 2017-12-26 | Payer: Medicare Other | Source: Ambulatory Visit

## 2018-01-06 ENCOUNTER — Ambulatory Visit (INDEPENDENT_AMBULATORY_CARE_PROVIDER_SITE_OTHER)
Admission: RE | Admit: 2018-01-06 | Discharge: 2018-01-06 | Disposition: A | Discharge status: Home / Self Care | Payer: Medicare Other | Source: Ambulatory Visit | Attending: Pulmonary Disease | Admitting: Pulmonary Disease

## 2018-01-06 DIAGNOSIS — J849 Interstitial pulmonary disease, unspecified: Secondary | ICD-10-CM | POA: Diagnosis not present

## 2018-01-08 ENCOUNTER — Ambulatory Visit (INDEPENDENT_AMBULATORY_CARE_PROVIDER_SITE_OTHER): Payer: Medicare Other | Admitting: Cardiovascular Disease

## 2018-01-08 ENCOUNTER — Encounter: Payer: Self-pay | Admitting: Cardiovascular Disease

## 2018-01-08 VITALS — BP 136/76 | HR 85 | Ht 67.0 in | Wt 189.0 lb

## 2018-01-08 DIAGNOSIS — I272 Pulmonary hypertension, unspecified: Secondary | ICD-10-CM

## 2018-01-08 DIAGNOSIS — I714 Abdominal aortic aneurysm, without rupture, unspecified: Secondary | ICD-10-CM

## 2018-01-08 DIAGNOSIS — E7800 Pure hypercholesterolemia, unspecified: Secondary | ICD-10-CM | POA: Diagnosis not present

## 2018-01-08 DIAGNOSIS — I2510 Atherosclerotic heart disease of native coronary artery without angina pectoris: Secondary | ICD-10-CM

## 2018-01-08 DIAGNOSIS — I350 Nonrheumatic aortic (valve) stenosis: Secondary | ICD-10-CM

## 2018-01-08 NOTE — Addendum Note (Signed)
Addended by: Therisa Doyne on: 01/08/2018 03:30 PM   Modules accepted: Orders

## 2018-01-08 NOTE — Assessment & Plan Note (Signed)
History of hyperlipidemia on statin therapy followed by his PCP 

## 2018-01-08 NOTE — Assessment & Plan Note (Signed)
Severe pulmonary hypertension demonstrated by 2-D echo as well as right heart cath performed by Dr. Jeffie Pollock with improvement in symptoms with the addition of Revatio.

## 2018-01-08 NOTE — Assessment & Plan Note (Signed)
History of small abdominal aortic aneurysm measuring 2.9 centimeters by ultrasound 09/25/17. We will stop measuring it after this time

## 2018-01-08 NOTE — Assessment & Plan Note (Signed)
History of CAD status post coronary artery bypass grafting 5 partially 10 years ago recatheterization prior to his endoluminal stent graft 10/09 that showed patent grafts. He denies chest pain. He did have a Myoview stress test performed 10/23/17 that was nonischemic with an EF of 58%.

## 2018-01-08 NOTE — Patient Instructions (Signed)
Medication Instructions: Your physician recommends that you continue on your current medications as directed. Please refer to the Current Medication list given to you today.   Testing/Procedures: Your physician has requested that you have an echocardiogram in November. Echocardiography is a painless test that uses sound waves to create images of your heart. It provides your doctor with information about the size and shape of your heart and how well your heart's chambers and valves are working. This procedure takes approximately one hour. There are no restrictions for this procedure.  Follow-Up: Your physician wants you to follow-up in: 6 months with Dr. Berry. You will receive a reminder letter in the mail two months in advance. If you don't receive a letter, please call our office to schedule the follow-up appointment.  If you need a refill on your cardiac medications before your next appointment, please call your pharmacy.  

## 2018-01-08 NOTE — Progress Notes (Signed)
01/08/2018 Vincent Robinson   04/13/1923  [id number]  Primary Physician Briscoe, Jannifer Rodney, MD Primary Cardiologist: Lorretta Harp MD [name], [place]  HPI:  Vincent Robinson is a 84 y.o.   married, father of 2, grandfather of 2 grandchildren originally from [place] who lives in [place] now be closer to his family. He is a retired Estate manager/land agent. He was referred by Dr. Doreene Nest for cardiovascular evaluation because of increasing dyspnea on exertion thought not to be a pulmonary etiology. I last saw him in the office 10/11/17 He does have a history of hyperlipidemia. He's never had a heart attack or stroke. He had coronary artery bypass grafting 5-10 years ago with recath prior to his endoluminal stent graft 10/09 that showed patent grafts. He had an endoluminal stent graft for abdominal aortic aneurysm in [place] 10/09 with recent Doppler performed 2/18 revealed aortic dimensions of approximately 4.4 x 4.6 cm. Recently had a spontaneous pneumothorax that was treated conservatively as well as a diverticular bleed requiring significant blood transfusion. He is currently on continuous nasal oxygen over the last 2 months. He was begun on a diuretic which improved his breathing. Since I saw him about a month ago we obtained a 2-D echo that showed normal LV systolic function, mild to moderate aortic stenosis with severe pulmonary hypertension. Abdominal Dopplers revealed a abdominal aorta measuring 2.9 cm. He does relate increasing dyspnea on exertion despite supplemental oxygen. He does have 1-2+ pitting edema. There is also had a history of spontaneous pneumothorax on the right hear good breath sounds bilaterally. I'm going to obtain a PA and lateral chest x-ray, double diuretics and obtain a CT rule out chronic PE as well as a pharmacologic Myoview stress test to rule out an ischemic etiology which was performed 10/23/17 and was entirely normal. I'm referring him to the advanced heart failure  clinic for further evaluation . I referred him to Dr. Jeffie Pollock  who performed a right heart cath confirming pulmonary hypertension and began him on Revatio which has resulted in improvement in his symptoms.      Current Meds  Medication Sig  . albuterol (PROVENTIL HFA;VENTOLIN HFA) 108 (90 Base) MCG/ACT inhaler Inhale 1 puff into the lungs 2 (two) times daily. (Patient taking differently: Inhale 1 puff into the lungs every 4 (four) hours as needed for wheezing or shortness of breath. )  . aspirin (ASPIRIN 81) 81 MG chewable tablet Chew 1 tablet (81 mg total) by mouth daily. Restart in 1 week  . benzonatate (TESSALON PERLES) 100 MG capsule Take 1 capsule (100 mg total) by mouth 3 (three) times daily as needed for cough.  . fluticasone (VERAMYST) 27.5 MCG/SPRAY nasal spray Place 1 spray into the nose daily.  . Fluticasone-Salmeterol (AIRDUO RESPICLICK 970/26) 378-58 MCG/ACT AEPB Inhale 1 puff into the lungs 2 (two) times daily.  . furosemide (LASIX) 40 MG tablet Take 1 tablet (40 mg total) by mouth daily. Can take extra tab as needed  . levothyroxine (SYNTHROID, LEVOTHROID) 112 MCG tablet Take 112 mcg by mouth daily before breakfast.  . montelukast (SINGULAIR) 10 MG tablet TAKE ONE TABLET BY MOUTH ONE TIME DAILY AT BEDTIME  . oxybutynin (DITROPAN) 5 MG tablet Take 5 mg by mouth every 8 (eight) hours as needed for bladder spasms.  . sildenafil (REVATIO) 20 MG tablet Take 1 tablet (20 mg total) by mouth 3 (three) times daily.  . simvastatin (ZOCOR) 10 MG tablet Take 10 mg by mouth at bedtime.  No Known Allergies  Social History   Socioeconomic History  . Marital status: Married    Spouse name: Not on file  . Number of children: Not on file  . Years of education: Not on file  . Highest education level: Not on file  Social Needs  . Financial resource strain: Not on file  . Food insecurity - worry: Not on file  . Food insecurity - inability: Not on file  . Transportation needs -  medical: Not on file  . Transportation needs - non-medical: Not on file  Occupational History  . Not on file  Tobacco Use  . Smoking status: Former Smoker    Packs/day: 0.50    Last attempt to quit: 08/06/1961    Years since quitting: 56.4  . Smokeless tobacco: Never Used  Substance and Sexual Activity  . Alcohol use: Not on file  . Drug use: Not on file  . Sexual activity: Not on file  Other Topics Concern  . Not on file  Social History Narrative  . Not on file     Review of Systems: General: negative for chills, fever, night sweats or weight changes.  Cardiovascular: negative for chest pain, dyspnea on exertion, edema, orthopnea, palpitations, paroxysmal nocturnal dyspnea or shortness of breath Dermatological: negative for rash Respiratory: negative for cough or wheezing Urologic: negative for hematuria Abdominal: negative for nausea, vomiting, diarrhea, bright red blood per rectum, melena, or hematemesis Neurologic: negative for visual changes, syncope, or dizziness All other systems reviewed and are otherwise negative except as noted above.    Blood pressure 136/76, pulse 85, height 5\' 7"  (1.702 m), weight 189 lb (85.7 kg), SpO2 (!) 88 %.  General appearance: alert and no distress Neck: no adenopathy, no carotid bruit, no JVD, supple, symmetrical, trachea midline and thyroid not enlarged, symmetric, no tenderness/mass/nodules Lungs: clear to auscultation bilaterally Heart: soft outflow tract murmur consistent with aortic stenosis Extremities: extremities normal, atraumatic, no cyanosis or edema Pulses: 2+ and symmetric Skin: Skin color, texture, turgor normal. No rashes or lesions Neurologic: Alert and oriented X 3, normal strength and tone. Normal symmetric reflexes. Normal coordination and gait  EKG not performed today  ASSESSMENT AND PLAN:   Hyperlipidemia History of hyperlipidemia on statin therapy followed by his PCP  Coronary artery disease History of CAD  status post coronary artery bypass grafting 5 partially 10 years ago recatheterization prior to his endoluminal stent graft 10/09 that showed patent grafts. He denies chest pain. He did have a Myoview stress test performed 10/23/17 that was nonischemic with an EF of 58%.  AAA (abdominal aortic aneurysm) without rupture ([place]) History of small abdominal aortic aneurysm measuring 2.9 centimeters by ultrasound 09/25/17. We will stop measuring it after this time  Pulmonary hypertension, unspecified ([place]) Severe pulmonary hypertension demonstrated by 2-D echo as well as right heart cath performed by Dr. Jeffie Pollock with improvement in symptoms with the addition of Revatio.   Aortic stenosis, moderate History of mild to moderate aortic stenosis by 2-D echocardiogram performed 09/10/17 with a valve area of 1.167 m and a peak gradient of 26 L of mercury.      Lorretta Harp MD FACP,FACC,FAHA, [facility] 01/08/2018 2:56 PM

## 2018-01-08 NOTE — Assessment & Plan Note (Signed)
History of mild to moderate aortic stenosis by 2-D echocardiogram performed 09/10/17 with a valve area of 1.167 m and a peak gradient of 26 L of mercury.

## 2018-01-10 ENCOUNTER — Other Ambulatory Visit: Payer: Medicare Other

## 2018-01-29 ENCOUNTER — Other Ambulatory Visit: Payer: Self-pay | Admitting: Cardiovascular Disease

## 2018-01-29 NOTE — Telephone Encounter (Signed)
REFILL 

## 2018-03-03 ENCOUNTER — Ambulatory Visit: Payer: Medicare Other | Admitting: Pulmonary Disease

## 2018-03-06 ENCOUNTER — Ambulatory Visit (HOSPITAL_COMMUNITY)
Admission: RE | Admit: 2018-03-06 | Discharge: 2018-03-06 | Disposition: A | Discharge status: Home / Self Care | Payer: Medicare Other | Source: Ambulatory Visit | Attending: Internal Medicine | Admitting: Internal Medicine

## 2018-03-06 ENCOUNTER — Encounter (HOSPITAL_COMMUNITY): Payer: Self-pay | Admitting: Internal Medicine

## 2018-03-06 ENCOUNTER — Encounter: Payer: Self-pay | Admitting: Pulmonary Disease

## 2018-03-06 ENCOUNTER — Ambulatory Visit (INDEPENDENT_AMBULATORY_CARE_PROVIDER_SITE_OTHER): Payer: Medicare Other | Admitting: Pulmonary Disease

## 2018-03-06 VITALS — BP 122/72 | HR 65 | Ht 67.5 in | Wt 184.2 lb

## 2018-03-06 VITALS — BP 122/66 | HR 98 | Wt 185.8 lb

## 2018-03-06 DIAGNOSIS — J45991 Cough variant asthma: Secondary | ICD-10-CM | POA: Insufficient documentation

## 2018-03-06 DIAGNOSIS — Z825 Family history of asthma and other chronic lower respiratory diseases: Secondary | ICD-10-CM | POA: Insufficient documentation

## 2018-03-06 DIAGNOSIS — E039 Hypothyroidism, unspecified: Secondary | ICD-10-CM | POA: Insufficient documentation

## 2018-03-06 DIAGNOSIS — Z7982 Long term (current) use of aspirin: Secondary | ICD-10-CM | POA: Diagnosis not present

## 2018-03-06 DIAGNOSIS — Z85828 Personal history of other malignant neoplasm of skin: Secondary | ICD-10-CM | POA: Diagnosis not present

## 2018-03-06 DIAGNOSIS — I350 Nonrheumatic aortic (valve) stenosis: Secondary | ICD-10-CM

## 2018-03-06 DIAGNOSIS — Z823 Family history of stroke: Secondary | ICD-10-CM | POA: Diagnosis not present

## 2018-03-06 DIAGNOSIS — Z951 Presence of aortocoronary bypass graft: Secondary | ICD-10-CM | POA: Diagnosis not present

## 2018-03-06 DIAGNOSIS — Z9981 Dependence on supplemental oxygen: Secondary | ICD-10-CM | POA: Diagnosis not present

## 2018-03-06 DIAGNOSIS — J9621 Acute and chronic respiratory failure with hypoxia: Secondary | ICD-10-CM | POA: Diagnosis not present

## 2018-03-06 DIAGNOSIS — I272 Pulmonary hypertension, unspecified: Secondary | ICD-10-CM

## 2018-03-06 DIAGNOSIS — J449 Chronic obstructive pulmonary disease, unspecified: Secondary | ICD-10-CM

## 2018-03-06 DIAGNOSIS — G4733 Obstructive sleep apnea (adult) (pediatric): Secondary | ICD-10-CM | POA: Diagnosis not present

## 2018-03-06 DIAGNOSIS — Z79899 Other long term (current) drug therapy: Secondary | ICD-10-CM | POA: Diagnosis not present

## 2018-03-06 DIAGNOSIS — I714 Abdominal aortic aneurysm, without rupture: Secondary | ICD-10-CM | POA: Diagnosis not present

## 2018-03-06 DIAGNOSIS — E785 Hyperlipidemia, unspecified: Secondary | ICD-10-CM | POA: Insufficient documentation

## 2018-03-06 DIAGNOSIS — I2510 Atherosclerotic heart disease of native coronary artery without angina pectoris: Secondary | ICD-10-CM

## 2018-03-06 DIAGNOSIS — Z87891 Personal history of nicotine dependence: Secondary | ICD-10-CM | POA: Insufficient documentation

## 2018-03-06 DIAGNOSIS — J849 Interstitial pulmonary disease, unspecified: Secondary | ICD-10-CM | POA: Insufficient documentation

## 2018-03-06 MED ORDER — SILDENAFIL CITRATE 20 MG PO TABS: 40.0000 mg | ORAL_TABLET | Freq: Three times a day (TID) | ORAL | 3 refills | Status: DC

## 2018-03-06 MED ORDER — AMOXICILLIN 500 MG PO TABS: 2000.0000 mg | ORAL_TABLET | ORAL | 1 refills | Status: DC | PRN

## 2018-03-06 NOTE — Addendum Note (Signed)
Encounter addended by: Scarlette Calico, RN on: 03/06/2018 2:37 PM  Actions taken: Pharmacy for encounter modified, Diagnosis association updated, Order list changed, Sign clinical note

## 2018-03-06 NOTE — Progress Notes (Signed)
ADVANCED HF CLINIC CONSULT NOTE  Referring Physician: Dr. Gwenlyn Found  Primary Cardiologist: Dr. Gwenlyn Found   HPI:  Vincent Robinson is 84 year old with past history of COPD, hypersomnia with sleep apnea, allergic rhinitis, cough variant asthma. He recently moved from [place] to be close to his family and wants to establish care here. He is chief complaints of dyspnea on exertion. He has history of sleep apnea on BiPAP at settings of 20/6. This is report of CT of the chest from [place] which shows calcified nodule, chronic scarring. 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 He moved to [place] in 1956. Apparently his immigration was delayed due to a lung abnormality on imaging. He was eventually cleared. He does not have any history of TB, exposure to TB. He's had an episode of influenza while in [place].  Hospitalized in July, 2018 for a spontaneous pneumothorax that was treated conservatively. He was again readmitted in August 2018 for lower GI bleed (diverticular). During this admission he was noted to be in acute hypoxic respiratory failure secondary to diastolic heart failure. He improved with diuresis.  He had coronary artery bypass grafting in 1998. He had an endoluminal stent graft for abdominal aortic aneurysm in [place] 10/09 . Pre-op cath showed patent grafts.  Doppler performed 2/18 revealed aortic dimensions of approximately 4.4 x 4.6 cm.   Has been followed by Dr. Vaughan Browner. Smoked a little bit in remote past but quit a long time ago. No h/o connective tissue disease. Wears BIPAP for severe OSA. Has been on home O2 since 2017. Dr. Vaughan Browner has arranged for BIPAP titration and repeat hi-res CT. BiPAP download December 2017-January 2018. AHI 7.2  Returns for f/u. Underwent RHC on 12/11/17 with Moderate PAH (see below). Started on sildenafil 20 tid. Had repeat CT scan 3/19 with  stable ILD. PFTs not that bad. Says that he feels better on sildenafil. Can get around very well. Can walk "pretty far" without stopping. Edema relatively well-controlled with lasix. No orthopnea or PND. No syncope. Checks pulse ox at home and runs 90-95% on 2L.   Studies:  RHC 12/11/17 RA = 6 RV = 72/7 PA = 73/27 (44) PCW = 10 Fick cardiac output/index = 4.1/2.1 PVR = 8.4 WU Ao sat = 91% PA sat = 58%, 58%   Echo 11/18 EF 60-65% grade I DD  RV severely dilated moderate TR RVSP 12mmHG   VQ 12/18 Normal perfusion lung scan.  Irregular peripheral ventilation in both lungs consistent with chronic parenchymal lung disease changes.  PFTs 02/28/16 FVC 3.37 (116%), FEV1 2.08 (108%), F/F 62 Mild obstructive airway disease, no broncho-dilator response  CT scan 07/15/15- 2 .3 x 1.7 cm right upper lobe calcified opacity likely to represent pleural , parenchymal scarring. Mild centrilobular, paraseptal emphysema. Subpleural reticulation, fibrosis representing mild chronic ILD High-resolution CT 09/04/16 - stable right upper lobe scarring, hyper aerated lungs. Minimal interstitial lung disease and peripheral unchanged compared to 2016.  Hi-res CT: 01/06/18: Stable ILD     Past Medical History:  Diagnosis Date  . AAA (abdominal aortic aneurysm) ([place])   . Asthma   . Basal cell carcinoma   . COPD (chronic obstructive pulmonary disease) ([place])   . Coronary artery disease   . Hyperlipidemia   . Hypothyroidism   . Skin cancer 2017  . Sleep apnea     Current  Outpatient Medications  Medication Sig Dispense Refill  . albuterol (PROVENTIL HFA;VENTOLIN HFA) 108 (90 Base) MCG/ACT inhaler Inhale 1 puff into the lungs 2 (two) times daily. (Patient taking differently: Inhale 1 puff into the lungs every 4 (four) hours as needed for wheezing or shortness of breath. ) 1 Inhaler 2  . aspirin (ASPIRIN 81) 81 MG chewable tablet Chew 1 tablet (81 mg total) by mouth daily. Restart in 1 week    .  fluticasone (VERAMYST) 27.5 MCG/SPRAY nasal spray Place 1 spray into the nose daily.    . Fluticasone-Salmeterol (AIRDUO RESPICLICK 505/39) 767-34 MCG/ACT AEPB Inhale 1 puff into the lungs 2 (two) times daily. 1 each 11  . furosemide (LASIX) 40 MG tablet TAKE 1 TABLET BY MOUTH TWICE A DAY 60 tablet 11  . levothyroxine (SYNTHROID, LEVOTHROID) 112 MCG tablet Take 112 mcg by mouth daily before breakfast.    . loratadine (CLARITIN) 10 MG tablet Take 10 mg by mouth daily.    . montelukast (SINGULAIR) 10 MG tablet TAKE ONE TABLET BY MOUTH ONE TIME DAILY AT BEDTIME 30 tablet 4  . oxybutynin (DITROPAN) 5 MG tablet Take 5 mg by mouth every 8 (eight) hours as needed for bladder spasms.    . sildenafil (REVATIO) 20 MG tablet Take 1 tablet (20 mg total) by mouth 3 (three) times daily. 90 tablet 3  . simvastatin (ZOCOR) 10 MG tablet Take 10 mg by mouth at bedtime.  3   No current facility-administered medications for this encounter.     No Known Allergies    Social History   Socioeconomic History  . Marital status: Married    Spouse name: Not on file  . Number of children: Not on file  . Years of education: Not on file  . Highest education level: Not on file  Occupational History  . Not on file  Social Needs  . Financial resource strain: Not on file  . Food insecurity:    Worry: Not on file    Inability: Not on file  . Transportation needs:    Medical: Not on file    Non-medical: Not on file  Tobacco Use  . Smoking status: Former Smoker    Packs/day: 0.50    Last attempt to quit: 08/06/1961    Years since quitting: 56.6  . Smokeless tobacco: Never Used  Substance and Sexual Activity  . Alcohol use: Not on file  . Drug use: Not on file  . Sexual activity: Not on file  Lifestyle  . Physical activity:    Days per week: Not on file    Minutes per session: Not on file  . Stress: Not on file  Relationships  . Social connections:    Talks on phone: Not on file    Gets together: Not on  file    Attends religious service: Not on file    Active member of club or organization: Not on file    Attends meetings of clubs or organizations: Not on file    Relationship status: Not on file  . Intimate partner violence:    Fear of current or ex partner: Not on file    Emotionally abused: Not on file    Physically abused: Not on file    Forced sexual activity: Not on file  Other Topics Concern  . Not on file  Social History Narrative  . Not on file      Family History  Problem Relation Age of Onset  . Emphysema Mother   .  Stroke Father   . Movement disorder Sister   . Aneurysm Brother        Thoracic aorta  . Macular degeneration Brother     Vitals:   03/06/18 1349  BP: 122/66  Pulse: 98  SpO2: 91%  Weight: 185 lb 12.8 oz (84.3 kg)    PHYSICAL EXAM: General:  Elderly. Wearing O2.  No resp difficulty HEENT: normal Neck: supple. JV 6. Carotids 2+ bilat; no bruits. No lymphadenopathy or thryomegaly appreciated. Cor: PMI nondisplaced. Regular rate & rhythm. 2/6 AS Lungs: clear but decreased throughout Abdomen: soft, nontender, nondistended. No hepatosplenomegaly. No bruits or masses. Good bowel sounds. Extremities: no cyanosis, clubbing, rash, 2+ ankle edema  No pretibial edema  Neuro: alert & orientedx3, cranial nerves grossly intact. moves all 4 extremities w/o difficulty. Affect pleasant  ASSESSMENT & PLAN:  1. Pulmonary hypertension with cor pulmonale - echo, CT, PFTs and VQ studies reviewed personally - he has evidence of significant pulmonary HTN with severe RV failure. Etiology remains unclear. Symptoms improved with diuresis - he has had a very thorough w/u to date with Dr. Vaughan Browner and Dr. Gwenlyn Found - CT with evidence of ILD but PFTs not that bad. VQ negative.  - suspect PH multifactorial but PAH out of proportion to LV pressures and ILD so suspect he must have component of Group I disease - RHC 2/19 c/w moderate PAH - Seems to be responding well to  sildenafil without evidence of shunting. Will increase the dose to 40 tid  - have suggested referral to Pulmonary rehab but he refuses - will do 6MW today - compression stockings  2. Chronic respiratory failure - evidence of mild ILD on CT. PFTs not too bad - has severe OSA on Bipap - continue supplemental O2 - managed by Pulmonary  3. CAD s/p CABG - no s/s ischemia. Recent Myoview 1/19 normal.  - Managed by Dr. Gwenlyn Found.   4. Aortic stenosis - moderate by echo 11/18  Glori Bickers, MD  2:01 PM

## 2018-03-06 NOTE — Progress Notes (Signed)
Vincent Robinson    [id number]    1923-03-19  Primary Care Physician:Briscoe, Jannifer Rodney, MD  Referring Physician: Katherina Mires, MD [address] [place], [place] [address]  Chief complaint:   Follow up for COPD Mild ILD, post inflammatory scarring Spontaneous pneumothorax July 2018 > resolved  HPI: Vincent Robinson is 84 year old with past history of COPD, hypersomnia with sleep apnea, allergic rhinitis, cough variant asthma. He recently moved from [place] to be close to his family and wants to establish care here. He is chief complaints of dyspnea on exertion. He has history of sleep apnea on BiPAP at settings of 20/6. This is report of CT of the chest from [place] which shows calcified nodule, chronic scarring. 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 He moved to [place] in 1956. Apparently his immigration was delayed due to a lung abnormality on imaging. He was eventually cleared. He does not have any history of TB, exposure to TB. He's had an episode of influenza while in [place].  Hospitalized in July, 2018 for a spontaneous pneumothorax that was treated conservatively. He was again readmitted in August 2018 for lower GI bleed. During this admission he was noted to be in acute hypoxic respiratory failure secondary to diastolic heart failure. He improved with diuresis.  Interim history: Started on sildenafil for pulmonary hypertension.  Follows with cardiology and advanced heart failure States that his breathing is slowly improving.  Continues on airduo and supplemental O2  Outpatient Encounter Medications as of 03/06/2018  Medication Sig  . albuterol (PROVENTIL HFA;VENTOLIN HFA) 108 (90 Base) MCG/ACT inhaler Inhale 1 puff into the lungs 2 (two) times daily. (Patient taking differently: Inhale 1 puff into the lungs every 4 (four) hours as  needed for wheezing or shortness of breath. )  . aspirin (ASPIRIN 81) 81 MG chewable tablet Chew 1 tablet (81 mg total) by mouth daily. Restart in 1 week  . fluticasone (VERAMYST) 27.5 MCG/SPRAY nasal spray Place 1 spray into the nose daily.  . Fluticasone-Salmeterol (AIRDUO RESPICLICK 220/25) 427-06 MCG/ACT AEPB Inhale 1 puff into the lungs 2 (two) times daily.  . furosemide (LASIX) 40 MG tablet TAKE 1 TABLET BY MOUTH TWICE A DAY  . levothyroxine (SYNTHROID, LEVOTHROID) 112 MCG tablet Take 112 mcg by mouth daily before breakfast.  . loratadine (CLARITIN) 10 MG tablet Take 10 mg by mouth daily.  . montelukast (SINGULAIR) 10 MG tablet TAKE ONE TABLET BY MOUTH ONE TIME DAILY AT BEDTIME  . oxybutynin (DITROPAN) 5 MG tablet Take 5 mg by mouth every 8 (eight) hours as needed for bladder spasms.  . sildenafil (REVATIO) 20 MG tablet Take 1 tablet (20 mg total) by mouth 3 (three) times daily.  . simvastatin (ZOCOR) 10 MG tablet Take 10 mg by mouth at bedtime.  . [DISCONTINUED] benzonatate (TESSALON PERLES) 100 MG capsule Take 1 capsule (100 mg total) by mouth 3 (three) times daily as needed for cough.   No facility-administered encounter medications on file as of 03/06/2018.     Allergies as of 03/06/2018  . (No Known Allergies)    Past Medical History:  Diagnosis Date  . AAA (abdominal aortic aneurysm) ([facility])   . Asthma   . Basal cell carcinoma   . COPD (chronic obstructive pulmonary disease) ([facility])   . Coronary artery  disease   . Hyperlipidemia   . Hypothyroidism   . Skin cancer 2017  . Sleep apnea     Past Surgical History:  Procedure Laterality Date  . bleeding ulcer  2016  . BYPASS AXILLA/BRACHIAL ARTERY  1998  . colon bleed  2012  . COLONOSCOPY N/A 06/07/2017   Procedure: COLONOSCOPY;  Surgeon: Carol Ada, MD;  Location: [facility];  Service: Endoscopy;  Laterality: N/A;  . DEBRIDMENT OF DECUBITUS ULCER  2002  . RIGHT HEART CATH N/A 12/11/2017   Procedure: RIGHT HEART CATH;   Surgeon: Jolaine Artist, MD;  Location: [facility];  Service: Cardiovascular;  Laterality: N/A;  . THORACIC AORTA STENT  2009    Family History  Problem Relation Age of Onset  . Emphysema Mother   . Stroke Father   . Movement disorder Sister   . Aneurysm Brother        Thoracic aorta  . Macular degeneration Brother     Social History   Socioeconomic History  . Marital status: Married    Spouse name: Not on file  . Number of children: Not on file  . Years of education: Not on file  . Highest education level: Not on file  Occupational History  . Not on file  Social Needs  . Financial resource strain: Not on file  . Food insecurity:    Worry: Not on file    Inability: Not on file  . Transportation needs:    Medical: Not on file    Non-medical: Not on file  Tobacco Use  . Smoking status: Former Smoker    Packs/day: 0.50    Last attempt to quit: 08/06/1961    Years since quitting: 56.6  . Smokeless tobacco: Never Used  Substance and Sexual Activity  . Alcohol use: Not on file  . Drug use: Not on file  . Sexual activity: Not on file  Lifestyle  . Physical activity:    Days per week: Not on file    Minutes per session: Not on file  . Stress: Not on file  Relationships  . Social connections:    Talks on phone: Not on file    Gets together: Not on file    Attends religious service: Not on file    Active member of club or organization: Not on file    Attends meetings of clubs or organizations: Not on file    Relationship status: Not on file  . Intimate partner violence:    Fear of current or ex partner: Not on file    Emotionally abused: Not on file    Physically abused: Not on file    Forced sexual activity: Not on file  Other Topics Concern  . Not on file  Social History Narrative  . Not on file   Review of systems: Review of Systems  Constitutional: Negative for fever and chills.  HENT: Negative.   Eyes: Negative for blurred vision.    Respiratory: as per HPI  Cardiovascular: Negative for chest pain and palpitations.  Gastrointestinal: Negative for vomiting, diarrhea, blood per rectum. Genitourinary: Negative for dysuria, urgency, frequency and hematuria.  Musculoskeletal: Negative for myalgias, back pain and joint pain.  Skin: Negative for itching and rash.  Neurological: Negative for dizziness, tremors, focal weakness, seizures and loss of consciousness.  Endo/Heme/Allergies: Negative for environmental allergies.  Psychiatric/Behavioral: Negative for depression, suicidal ideas and hallucinations.  All other systems reviewed and are negative.  Physical Exam: Blood pressure 120/72, pulse 91,  height 5\' 7"  (1.702 m), weight 187 lb (84.8 kg), SpO2 90 %. Gen:      No acute distress HEENT:  EOMI, sclera anicteric Neck:     No masses; no thyromegaly Lungs:    Bilateral crackles, no wheeze CV:         Regular rate and rhythm; no murmurs Abd:      + bowel sounds; soft, non-tender; no palpable masses, no distension Ext:    No edema; adequate peripheral perfusion Skin:      Warm and dry; no rash Neuro: alert and oriented x 3 Psych: normal mood and affect  Data Reviewed: PFTs 02/28/16 FVC 3.37 (116%), FEV1 2.08 (108%), F/F 62 Mild obstructive airway disease, no broncho-dilator response  CT scan 07/15/15- 2 .3 x 1.7 cm right upper lobe calcified opacity likely to represent pleural , parenchymal scarring. Mild centrilobular, paraseptal emphysema. Subpleural reticulation, fibrosis representing mild chronic ILD High-resolution CT 09/04/16 - stable right upper lobe scarring, hyper aerated lungs. Minimal interstitial lung disease and peripheral unchanged compared to 2016. Chest x-ray 05/20/17 - moderate right apical pneumothorax Chest x-ray 06/04/17- resolution of right apical pneumothorax. Chronic interstitial opacities High-resolution CT 01/06/18-interstitial lung disease.  Unchanged compared to 2017.  Stable mediastinal adenopathy  and ascending thoracic aneurysm. I reviewed the images personally.  BiPAP download December 2017-January 2018 Time spent greater than 4 hours- 97% AHI 7.2  Assessment:  COPD, asthmatic bronchitis. Continues on albuterol twice daily, albuterol as needed and supplemental oxygen  Mild ILD, postinflammatory lung fibrosis Previous CTs have apparently shown a calcified RUL scarring and mild ILD. The repeat high res resolution CT shows stability of findings from 2016 - 2019. I believe this may be from prior infections and exposures during his time in [place] and during world war.  He will not need to repeat CTs.  This can be followed up on chest x-rays.  OSA Continues on BiPAP with good compliance.  Heart failure, pulmonary hypertension Follow with cardiology and advanced heart failure service   Health maintenance 08/05/2017-influenza 08/05/2017-Prevnar 13 10/22/2002-Pneumovax  Plan/Recommendations: Continue airduo, O2, bipap  Marshell Garfinkel MD [facility] 03/06/2018, 11:43 AM  CC: Katherina Mires, MD

## 2018-03-06 NOTE — Patient Instructions (Signed)
I have reviewed your last CT scan which shows stable lung scarring I am glad that your breathing is slowly continuing to improve Continue the inhaler, supplemental oxygen and BiPAP I will see you back in 1 year time.

## 2018-03-06 NOTE — Progress Notes (Signed)
6 min walk completed, pt ambulated 820 ft (250 m).  On 2 L oxygen sats ranged 94-89%, HR ranged 98-123.

## 2018-03-06 NOTE — Patient Instructions (Signed)
Increase Sildenafil to 40 mg (2 tabs) Three times a day   Take Amoxicillin 2000 mg (4 tabs) 1 hour before your dental appointment  Please wear your compression hose daily, place them on as soon as you get up in the morning and remove before you go to bed at night.  Your physician recommends that you schedule a follow-up appointment in: 3 months

## 2018-03-12 ENCOUNTER — Other Ambulatory Visit: Payer: Self-pay | Admitting: Pulmonary Disease

## 2018-04-08 ENCOUNTER — Telehealth: Payer: Self-pay | Admitting: Pulmonary Disease

## 2018-04-08 DIAGNOSIS — G4733 Obstructive sleep apnea (adult) (pediatric): Secondary | ICD-10-CM

## 2018-04-08 NOTE — Telephone Encounter (Signed)
Called and spoke with patient, he states that the mask he has now hurts his nose and he is wanting something else. Patient states that he would like to have the air fit F30.   Dr. Vaughan Browner please advise, thank you.

## 2018-04-09 NOTE — Telephone Encounter (Signed)
Sent in order for mask to [facility].  ATC pt, no answer. Left message for pt to call back.

## 2018-04-09 NOTE — Telephone Encounter (Signed)
OK to send in the order for new mask fitting.

## 2018-04-10 NOTE — Telephone Encounter (Signed)
Attempted to call pt. I did not receive an answer. I have left a message for pt to return our call.  

## 2018-04-11 NOTE — Telephone Encounter (Signed)
Attempted to call pt. I did not receive an answer. I have left a message for pt to return our call.  

## 2018-04-14 NOTE — Telephone Encounter (Signed)
We have attempted to contact the pt several times with no success or call back from the pt. Per triage protocol, message will be closed.  

## 2018-05-26 ENCOUNTER — Telehealth: Payer: Self-pay | Admitting: Pulmonary Disease

## 2018-05-26 DIAGNOSIS — I272 Pulmonary hypertension, unspecified: Secondary | ICD-10-CM

## 2018-05-26 DIAGNOSIS — G4733 Obstructive sleep apnea (adult) (pediatric): Secondary | ICD-10-CM

## 2018-05-26 NOTE — Telephone Encounter (Signed)
Attempted to contact pt. Received busy signal x2. Will try back.

## 2018-05-27 ENCOUNTER — Telehealth: Payer: Self-pay | Admitting: Cardiovascular Disease

## 2018-05-27 MED ORDER — FUROSEMIDE 40 MG PO TABS: 40.0000 mg | ORAL_TABLET | Freq: Every day | ORAL | 11 refills | Status: DC

## 2018-05-27 NOTE — Telephone Encounter (Signed)
Called spoke with patient who reported that he would like to switch from [facility] to [facility] d/t customer service issues that he has been having "since the start."  Patient stated that he has been with [facility] for approximately 1 year, perhaps a little over.  Advised pt that there are regulations/insurance guidelines regarding the time frame that patient can switch companies.  Pt voiced his understanding.  Order placed to switch DME companies.  Patient also mentioned a question regarding his furosemide >> pt takes 40mg  daily.  Pt would like to know if this can be reduced as his LE edema has been down for 2 months.  Advised patient that it looks like Dr Quay Burow filled this for him last time and recommended that he call that office.  Patient then informed me that he believes that "Dr Vaughan Browner handles this" and requested his recommendations.  Dr Vaughan Browner please advise on the furosemide or if you would like this deferred to cardiology.  Thank you.

## 2018-05-27 NOTE — Telephone Encounter (Signed)
Spoke with pt who states he is currently taking 40 mg lasix BID. Pt reports he hasn't had any swelling in a couple months. Denies any SOB and states he would like for his lasix to be decreased. Routing to Dr. Gwenlyn Found for recommendation.

## 2018-05-27 NOTE — Telephone Encounter (Signed)
If Lasix twice daily is working what is he want to change?  He could always drop it to once a day and check his weights daily and check for lower extremity edema but am hesitant to stop anything that is working.

## 2018-05-27 NOTE — Telephone Encounter (Signed)
Spoke with pt, aware of dr berry's recommendations. He will track his progress.

## 2018-05-27 NOTE — Telephone Encounter (Signed)
Pt is returning call. Pt states that it was Dr. Gwenlyn Found was the one who had prescribed the Furosemide. Pt states that he will reach out to Dr. Kennon Holter office to get this medication adjusted.

## 2018-05-27 NOTE — Telephone Encounter (Signed)
New Message:    Pt wants to know if his Furosemide dose can be reduced? He says the swelling in his legs have been down for a month.

## 2018-05-28 NOTE — Telephone Encounter (Signed)
LMTCB

## 2018-05-28 NOTE — Telephone Encounter (Signed)
Gilda, [employer], Pt is not eligible for BiPap until 2020. O2 will need to have OV w/qualifying walk. Cb is [phone].

## 2018-05-28 NOTE — Telephone Encounter (Signed)
Spoke with patient. He is aware of [employer]'s requirements. Patient has been scheduled for 05/30/18 at 245pm with Dr. Vaughan Browner.   Nothing further needed at time of call.

## 2018-05-28 NOTE — Telephone Encounter (Signed)
Pt is returning call. Cb is [phone].

## 2018-05-30 ENCOUNTER — Ambulatory Visit: Payer: Medicare Other | Admitting: Pulmonary Disease

## 2018-06-02 ENCOUNTER — Encounter: Payer: Self-pay | Admitting: Pulmonary Disease

## 2018-06-02 ENCOUNTER — Ambulatory Visit (INDEPENDENT_AMBULATORY_CARE_PROVIDER_SITE_OTHER): Payer: Medicare Other | Admitting: Pulmonary Disease

## 2018-06-02 VITALS — BP 126/74 | HR 74 | Ht 67.5 in | Wt 184.8 lb

## 2018-06-02 DIAGNOSIS — I2510 Atherosclerotic heart disease of native coronary artery without angina pectoris: Secondary | ICD-10-CM | POA: Diagnosis not present

## 2018-06-02 DIAGNOSIS — J449 Chronic obstructive pulmonary disease, unspecified: Secondary | ICD-10-CM

## 2018-06-02 NOTE — Patient Instructions (Signed)
Continue your supplemental oxygen, inhalers as prescribed We will put in an order to change your oxygen provider to [employer] Follow-up in 6 months.

## 2018-06-02 NOTE — Progress Notes (Signed)
Lebert Lovern    [id number]    02/11/1923  Primary Care Physician:Briscoe, Jannifer Rodney, MD  Referring Physician: Katherina Mires, MD [place], [place] [address]  Chief complaint:   Follow up for COPD Mild ILD, post inflammatory scarring Spontaneous pneumothorax July 2018 > resolved  HPI: Mr. Casalino is 84 year old with past history of COPD, hypersomnia with sleep apnea, allergic rhinitis, cough variant asthma. He recently moved from [place] to be close to his family and wants to establish care here. He is chief complaints of dyspnea on exertion. He has history of sleep apnea on BiPAP at settings of 20/6. This is report of CT of the chest from [place] which shows calcified nodule, chronic scarring. 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 He moved to [place] in 1956. Apparently his immigration was delayed due to a lung abnormality on imaging. He was eventually cleared. He does not have any history of TB, exposure to TB. He's had an episode of influenza while in [place].  Hospitalized in July, 2018 for a spontaneous pneumothorax that was treated conservatively. He was again readmitted in August 2018 for lower GI bleed. During this admission he was noted to be in acute hypoxic respiratory failure secondary to diastolic heart failure. He improved with diuresis.  Interim history: Stable symptoms.  He is in the process of changing his DME provider to [facility] and active on supplemental oxygen  Follows with Dr. Alvester Chou and Dr. Haroldine Laws for heart failure, pulmonary hypertension.  He is on sildenafil  Outpatient Encounter Medications as of 06/02/2018  Medication Sig  . albuterol (PROVENTIL HFA;VENTOLIN HFA) 108 (90 Base) MCG/ACT inhaler Inhale 1 puff into the lungs 2 (two) times daily. (Patient taking differently: Inhale 1 puff  into the lungs every 4 (four) hours as needed for wheezing or shortness of breath. )  . amoxicillin (AMOXIL) 500 MG tablet Take 4 tablets (2,000 mg total) by mouth as needed (prior to dental work).  Marland Kitchen aspirin (ASPIRIN 81) 81 MG chewable tablet Chew 1 tablet (81 mg total) by mouth daily. Restart in 1 week  . fluticasone (VERAMYST) 27.5 MCG/SPRAY nasal spray Place 1 spray into the nose daily.  . Fluticasone-Salmeterol 113-14 MCG/ACT AEPB INHALE 1 PUFF TWICE A DAY  . furosemide (LASIX) 40 MG tablet Take 1 tablet (40 mg total) by mouth daily.  Marland Kitchen levothyroxine (SYNTHROID, LEVOTHROID) 112 MCG tablet Take 112 mcg by mouth daily before breakfast.  . loratadine (CLARITIN) 10 MG tablet Take 10 mg by mouth daily.  . montelukast (SINGULAIR) 10 MG tablet TAKE ONE TABLET BY MOUTH ONE TIME DAILY AT BEDTIME  . oxybutynin (DITROPAN) 5 MG tablet Take 5 mg by mouth every 8 (eight) hours as needed for bladder spasms.  . sildenafil (REVATIO) 20 MG tablet Take 2 tablets (40 mg total) by mouth 3 (three) times daily.  . simvastatin (ZOCOR) 10 MG tablet Take 10 mg by mouth at bedtime.   No facility-administered encounter medications on file as of 06/02/2018.     Allergies as of 06/02/2018  . (No Known Allergies)    Past Medical History:  Diagnosis Date  . AAA (abdominal aortic aneurysm) ([facility])   . Asthma   . Basal cell carcinoma   . COPD (chronic obstructive pulmonary disease) ([facility])   . Coronary  artery disease   . Hyperlipidemia   . Hypothyroidism   . Skin cancer 2017  . Sleep apnea     Past Surgical History:  Procedure Laterality Date  . bleeding ulcer  2016  . BYPASS AXILLA/BRACHIAL ARTERY  1998  . colon bleed  2012  . COLONOSCOPY N/A 06/07/2017   Procedure: COLONOSCOPY;  Surgeon: Carol Ada, MD;  [facility]: [facility];  Service: Endoscopy;  Laterality: N/A;  . DEBRIDMENT OF DECUBITUS ULCER  2002  . RIGHT HEART CATH N/A 12/11/2017   Procedure: RIGHT HEART CATH;  Surgeon: Jolaine Artist, MD;   Location: [facility];  Service: Cardiovascular;  Laterality: N/A;  . THORACIC AORTA STENT  2009    Family History  Problem Relation Age of Onset  . Emphysema Mother   . Stroke Father   . Movement disorder Sister   . Aneurysm Brother        Thoracic aorta  . Macular degeneration Brother     Social History   Socioeconomic History  . Marital status: Married    Spouse name: Not on file  . Number of children: Not on file  . Years of education: Not on file  . Highest education level: Not on file  Occupational History  . Not on file  Social Needs  . Financial resource strain: Not on file  . Food insecurity:    Worry: Not on file    Inability: Not on file  . Transportation needs:    Medical: Not on file    Non-medical: Not on file  Tobacco Use  . Smoking status: Former Smoker    Packs/day: 0.50    Last attempt to quit: 08/06/1961    Years since quitting: 56.8  . Smokeless tobacco: Never Used  Substance and Sexual Activity  . Alcohol use: Not on file  . Drug use: Not on file  . Sexual activity: Not on file  Lifestyle  . Physical activity:    Days per week: Not on file    Minutes per session: Not on file  . Stress: Not on file  Relationships  . Social connections:    Talks on phone: Not on file    Gets together: Not on file    Attends religious service: Not on file    Active member of club or organization: Not on file    Attends meetings of clubs or organizations: Not on file    Relationship status: Not on file  . Intimate partner violence:    Fear of current or ex partner: Not on file    Emotionally abused: Not on file    Physically abused: Not on file    Forced sexual activity: Not on file  Other Topics Concern  . Not on file  Social History Narrative  . Not on file   Review of systems: Review of Systems  Constitutional: Negative for fever and chills.  HENT: Negative.   Eyes: Negative for blurred vision.  Respiratory: as per HPI  Cardiovascular:  Negative for chest pain and palpitations.  Gastrointestinal: Negative for vomiting, diarrhea, blood per rectum. Genitourinary: Negative for dysuria, urgency, frequency and hematuria.  Musculoskeletal: Negative for myalgias, back pain and joint pain.  Skin: Negative for itching and rash.  Neurological: Negative for dizziness, tremors, focal weakness, seizures and loss of consciousness.  Endo/Heme/Allergies: Negative for environmental allergies.  Psychiatric/Behavioral: Negative for depression, suicidal ideas and hallucinations.  All other systems reviewed and are negative.  Physical Exam: Blood pressure 126/74, pulse 74,  height 5' 7.5" (1.715 m), weight 184 lb 12.8 oz (83.8 kg), SpO2 92 %. Gen:      No acute distress HEENT:  EOMI, sclera anicteric Neck:     No masses; no thyromegaly Lungs:    Scattered basilar crackles CV:         Regular rate and rhythm; systolic murmur Abd:      + bowel sounds; soft, non-tender; no palpable masses, no distension Ext:    1+ edema; adequate peripheral perfusion Skin:      Warm and dry; no rash Neuro: alert and oriented x 3 Psych: normal mood and affect  Data Reviewed: PFTs 02/28/16 FVC 3.37 (116%), FEV1 2.08 (108%), F/F 62 Mild obstructive airway disease, no broncho-dilator response  CT scan 07/15/15- 2 .3 x 1.7 cm right upper lobe calcified opacity likely to represent pleural , parenchymal scarring. Mild centrilobular, paraseptal emphysema. Subpleural reticulation, fibrosis representing mild chronic ILD High-resolution CT 09/04/16 - stable right upper lobe scarring, hyper aerated lungs. Minimal interstitial lung disease and peripheral unchanged compared to 2016. Chest x-ray 05/20/17 - moderate right apical pneumothorax Chest x-ray 06/04/17- resolution of right apical pneumothorax. Chronic interstitial opacities High-resolution CT 01/06/18-interstitial lung disease.  Unchanged compared to 2017.  Stable mediastinal adenopathy and ascending thoracic  aneurysm. I reviewed the images personally.  BiPAP download December 2017-January 2018 Time spent greater than 4 hours- 97% AHI 7.2  Assessment:  COPD, asthmatic bronchitis. Continues on albuterol twice daily, albuterol as needed and supplemental oxygen Qualifying work today.  Continue supplemental oxygen with [employer]  Mild ILD, postinflammatory lung fibrosis Previous CTs have apparently shown a calcified RUL scarring and mild ILD. The repeat high res resolution CT shows stability of findings from 2016 - 2019. I believe this may be from prior infections and exposures during his time in [place] and during world war.  He will not need to repeat CTs.  This can be followed up on chest x-rays.  OSA Continues on BiPAP with good compliance.  Heart failure, pulmonary hypertension Follow with cardiology and advanced heart failure service   Health maintenance 08/05/2017- Influenza 08/05/2017- Prevnar 13 10/22/2002- Pneumovax  Plan/Recommendations: - Continue airduo, O2, bipap  Marshell Garfinkel MD [facility] 06/02/2018, 2:29 PM  CC: Katherina Mires, MD

## 2018-06-09 ENCOUNTER — Ambulatory Visit (HOSPITAL_COMMUNITY)
Admission: RE | Admit: 2018-06-09 | Discharge: 2018-06-09 | Disposition: A | Discharge status: Home / Self Care | Payer: Medicare Other | Source: Ambulatory Visit | Attending: Internal Medicine | Admitting: Internal Medicine

## 2018-06-09 VITALS — BP 120/51 | HR 67 | Wt 181.0 lb

## 2018-06-09 DIAGNOSIS — I272 Pulmonary hypertension, unspecified: Secondary | ICD-10-CM | POA: Diagnosis present

## 2018-06-09 DIAGNOSIS — I2510 Atherosclerotic heart disease of native coronary artery without angina pectoris: Secondary | ICD-10-CM

## 2018-06-09 DIAGNOSIS — I350 Nonrheumatic aortic (valve) stenosis: Secondary | ICD-10-CM

## 2018-06-09 NOTE — Progress Notes (Signed)
ADVANCED HF CLINIC CONSULT NOTE  Referring Physician: Dr. Gwenlyn Found  Primary Cardiologist: Dr. Gwenlyn Found   HPI:  Vincent Robinson is 84 year old with past history of COPD, hypersomnia with sleep apnea, allergic rhinitis, cough variant asthma. He recently moved from [place] to be close to his family and wants to establish care here. He is chief complaints of dyspnea on exertion. He has history of sleep apnea on BiPAP at settings of 20/6. This is report of CT of the chest from [place] which shows calcified nodule, chronic scarring. 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 He moved to [place] in 1956. Apparently his immigration was delayed due to a lung abnormality on imaging. He was eventually cleared. He does not have any history of TB, exposure to TB. He's had an episode of influenza while in [place].  Hospitalized in July, 2018 for a spontaneous pneumothorax that was treated conservatively. He was again readmitted in August 2018 for lower GI bleed (diverticular). During this admission he was noted to be in acute hypoxic respiratory failure secondary to diastolic heart failure. He improved with diuresis.  He had coronary artery bypass grafting in 1998. He had an endoluminal stent graft for abdominal aortic aneurysm in [place] 10/09 . Pre-op cath showed patent grafts.  Doppler performed 2/18 revealed aortic dimensions of approximately 4.4 x 4.6 cm.   Has been followed by Dr. Vaughan Browner. Smoked a little bit in remote past but quit a long time ago. No h/o connective tissue disease. Wears BIPAP for severe OSA. Has been on home O2 since 2017. Dr. Vaughan Browner has arranged for BIPAP titration and repeat hi-res CT. BiPAP download December 2017-January 2018. AHI 7.2  6MW 02/2018: 820 feet, O2 sats 89-92% on 2L, HR 98-123  He returns today for f/u of pulmonary HTN. Last visit sildenafil was  increased to 40 mg TID. Overall doing well. Says he feels great. He cut his lasix back to 40 mg daily from 40 mg BID yesterday after discussing with Dr Vaughan Browner. Denies SOB, orthopnea, PND, or edema. Able to walk from here to car in garage with no problems. No problem with steps or hills. He gets dizzy only when he bends way over. No dizziness otherwise. Denies CP. Oxygen usually 87-92% at home on 2 L. Wears BiPAP qHS. Wearing TED hose. Weights trending down, 178-183 lbs. Down 4 lbs on our scale. Limits salt intake. Drinks less than 2 L/day. No palpitations, syncope or presyncope.   Studies:  RHC 12/11/17 RA = 6 RV = 72/7 PA = 73/27 (44) PCW = 10 Fick cardiac output/index = 4.1/2.1 PVR = 8.4 WU Ao sat = 91% PA sat = 58%, 58%  Echo 11/18 EF 60-65% grade I DD  RV severely dilated moderate TR RVSP 53mmHG   VQ 12/18 Normal perfusion lung scan.  Irregular peripheral ventilation in both lungs consistent with chronic parenchymal lung disease changes.  PFTs 02/28/16 FVC 3.37 (116%), FEV1 2.08 (108%), F/F 62 Mild obstructive airway disease, no broncho-dilator response  CT scan 07/15/15- 2 .3 x 1.7 cm right upper lobe calcified opacity likely to represent pleural , parenchymal scarring. Mild centrilobular, paraseptal emphysema. Subpleural reticulation, fibrosis representing mild chronic ILD High-resolution CT 09/04/16 - stable right upper lobe scarring, hyper aerated lungs. Minimal interstitial lung disease and peripheral unchanged compared to 2016.  Hi-res CT: 01/06/18: Stable ILD   Review  of systems complete and found to be negative unless listed in HPI.   Past Medical History:  Diagnosis Date  . AAA (abdominal aortic aneurysm) ([place])   . Asthma   . Basal cell carcinoma   . COPD (chronic obstructive pulmonary disease) ([place])   . Coronary artery disease   . Hyperlipidemia   . Hypothyroidism   . Skin cancer 2017  . Sleep apnea     Current Outpatient Medications  Medication Sig Dispense  Refill  . albuterol (PROVENTIL HFA;VENTOLIN HFA) 108 (90 Base) MCG/ACT inhaler Inhale 1 puff into the lungs 2 (two) times daily. (Patient taking differently: Inhale 1 puff into the lungs every 4 (four) hours as needed for wheezing or shortness of breath. ) 1 Inhaler 2  . amoxicillin (AMOXIL) 500 MG tablet Take 4 tablets (2,000 mg total) by mouth as needed (prior to dental work). 4 tablet 1  . aspirin (ASPIRIN 81) 81 MG chewable tablet Chew 1 tablet (81 mg total) by mouth daily. Restart in 1 week    . fluticasone (VERAMYST) 27.5 MCG/SPRAY nasal spray Place 1 spray into the nose daily.    . Fluticasone-Salmeterol 113-14 MCG/ACT AEPB INHALE 1 PUFF TWICE A DAY 1 each 5  . furosemide (LASIX) 40 MG tablet Take 1 tablet (40 mg total) by mouth daily. 60 tablet 11  . levothyroxine (SYNTHROID, LEVOTHROID) 112 MCG tablet Take 112 mcg by mouth daily before breakfast.    . loratadine (CLARITIN) 10 MG tablet Take 10 mg by mouth daily.    . montelukast (SINGULAIR) 10 MG tablet TAKE ONE TABLET BY MOUTH ONE TIME DAILY AT BEDTIME 30 tablet 4  . oxybutynin (DITROPAN) 5 MG tablet Take 5 mg by mouth every 8 (eight) hours as needed for bladder spasms.    . sildenafil (REVATIO) 20 MG tablet Take 2 tablets (40 mg total) by mouth 3 (three) times daily. 180 tablet 3  . simvastatin (ZOCOR) 10 MG tablet Take 10 mg by mouth at bedtime.  3   No current facility-administered medications for this encounter.     No Known Allergies    Social History   Socioeconomic History  . Marital status: Married    Spouse name: Not on file  . Number of children: Not on file  . Years of education: Not on file  . Highest education level: Not on file  Occupational History  . Not on file  Social Needs  . Financial resource strain: Not on file  . Food insecurity:    Worry: Not on file    Inability: Not on file  . Transportation needs:    Medical: Not on file    Non-medical: Not on file  Tobacco Use  . Smoking status: Former  Smoker    Packs/day: 0.50    Last attempt to quit: 08/06/1961    Years since quitting: 56.8  . Smokeless tobacco: Never Used  Substance and Sexual Activity  . Alcohol use: Not on file  . Drug use: Not on file  . Sexual activity: Not on file  Lifestyle  . Physical activity:    Days per week: Not on file    Minutes per session: Not on file  . Stress: Not on file  Relationships  . Social connections:    Talks on phone: Not on file    Gets together: Not on file    Attends religious service: Not on file    Active member of club or organization: Not on file    Attends meetings  of clubs or organizations: Not on file    Relationship status: Not on file  . Intimate partner violence:    Fear of current or ex partner: Not on file    Emotionally abused: Not on file    Physically abused: Not on file    Forced sexual activity: Not on file  Other Topics Concern  . Not on file  Social History Narrative  . Not on file      Family History  Problem Relation Age of Onset  . Emphysema Mother   . Stroke Father   . Movement disorder Sister   . Aneurysm Brother        Thoracic aorta  . Macular degeneration Brother     Vitals:   06/09/18 1302  BP: (!) 120/51  Pulse: 67  SpO2: 90%  Weight: 82.1 kg (181 lb)   Wt Readings from Last 3 Encounters:  06/09/18 82.1 kg (181 lb)  06/02/18 83.8 kg (184 lb 12.8 oz)  03/06/18 84.3 kg (185 lb 12.8 oz)    PHYSICAL EXAM: General: Elderly. Wearing O2. No resp difficulty. HEENT: Normal anicteric Neck: Supple. JVP flat. Carotids 2+ bilat; no bruits. No thyromegaly or nodule noted. Cor: PMI nondisplaced. RRR, 2/6 AS prominent P2  Lungs: clear, diminished throughout. No wheeze Abdomen: Soft, non-tender, non-distended, no HSM. No bruits or masses. +BS  Extremities: no cyanosis, clubbing, rash, edema Neuro: alert & oriented x 3, cranial nerves grossly intact. moves all 4 extremities w/o difficulty. Affect pleasant  ASSESSMENT & PLAN:  1.  Pulmonary hypertension with cor pulmonale - Echo, CT, PFTs and VQ studies reviewed personally - He has evidence of significant pulmonary HTN with severe RV failure. Etiology remains unclear. Symptoms improved with diuresis - He has had a very thorough w/u to date with Dr. Vaughan Browner and Dr. Gwenlyn Found - CT with evidence of ILD but PFTs not that bad. VQ negative.  - Suspect PH multifactorial but PAH out of proportion to LV pressures and ILD so suspect he must have component of Group I disease - RHC 2/19 c/w moderate PAH - Seems to be responding well to sildenafil without evidence of shunting. Continue 40 mg tid   - Continue lasix 40 mg daily - Have suggested referral to Pulmonary rehab but he refuses - Continue compression stockings   2. Chronic respiratory failure - evidence of mild ILD on CT. PFTs not too bad - has severe OSA on Bipap. Continue BiPAP qHS - continue supplemental O2 (on 2L0 - managed by Pulmonary. No change.   3. CAD s/p CABG - No s/s ischemia. Recent Myoview 1/19 normal.  - Managed by Dr. Gwenlyn Found.   4. Aortic stenosis - Moderate by echo 11/18. No change.   BMET  Vincent Shore, NP  1:05 PM   Patient seen and examined with the above-signed Advanced Practice Provider and/or Housestaff. I personally reviewed laboratory data, imaging studies and relevant notes. I independently examined the patient and formulated the important aspects of the plan. I have edited the note to reflect any of my changes or salient points. I have personally discussed the plan with the patient and/or family.  Overall doing quite well. Tolerating sildenafil 40 TID well. NYHA II. Given age and fact that he is relatively asymptomatic will not add ERA at this point.   Glori Bickers, MD  8:20 PM

## 2018-06-09 NOTE — Patient Instructions (Signed)
Your physician recommends that you schedule a follow-up appointment in: 3 months with echocardiogram  

## 2018-07-06 ENCOUNTER — Other Ambulatory Visit: Payer: Self-pay | Admitting: Pulmonary Disease

## 2018-07-09 ENCOUNTER — Encounter: Payer: Self-pay | Admitting: Cardiovascular Disease

## 2018-07-09 ENCOUNTER — Ambulatory Visit (INDEPENDENT_AMBULATORY_CARE_PROVIDER_SITE_OTHER): Payer: Medicare Other | Admitting: Cardiovascular Disease

## 2018-07-09 DIAGNOSIS — E7800 Pure hypercholesterolemia, unspecified: Secondary | ICD-10-CM

## 2018-07-09 DIAGNOSIS — I272 Pulmonary hypertension, unspecified: Secondary | ICD-10-CM | POA: Diagnosis not present

## 2018-07-09 DIAGNOSIS — I350 Nonrheumatic aortic (valve) stenosis: Secondary | ICD-10-CM

## 2018-07-09 DIAGNOSIS — I2510 Atherosclerotic heart disease of native coronary artery without angina pectoris: Secondary | ICD-10-CM

## 2018-07-09 DIAGNOSIS — I714 Abdominal aortic aneurysm, without rupture, unspecified: Secondary | ICD-10-CM

## 2018-07-09 NOTE — Assessment & Plan Note (Signed)
History of moderate aortic stenosis by 2D echo performed 09/10/2017 with an aortic valve measuring 1cm/m and a peak gradient of 26 mill meters of mercury.

## 2018-07-09 NOTE — Assessment & Plan Note (Signed)
History of hyperlipidemia on statin therapy followed by his PCP 

## 2018-07-09 NOTE — Assessment & Plan Note (Signed)
History of CAD status post coronary artery bypass grafting x 5  10 years ago with recath prior to his endoluminal stent graft 10/09 showing patent grafts.  He has a normal EF by 2D echo and denies chest pain.

## 2018-07-09 NOTE — Assessment & Plan Note (Signed)
Severe pulmonary hypertension with pulmonary artery systolic pressures in the 80s on sildenafil followed by Dr. Haroldine Laws

## 2018-07-09 NOTE — Assessment & Plan Note (Signed)
History of abdominal aortic aneurysm measuring 4.4 cm status post endoluminal stent grafting in [place].  Follow-up ultrasound revealed his aorta measures 2.9 cm.

## 2018-07-09 NOTE — Patient Instructions (Signed)
Medication Instructions:  Your physician recommends that you continue on your current medications as directed. Please refer to the Current Medication list given to you today.  Labwork: n0ne  Testing/Procedures: none  Follow-Up: We request that you follow-up in: 6 months with an extender and in 12 months with Dr Andria Rhein will receive a reminder letter in the mail two months in advance. If you don't receive a letter, please call our office to schedule the follow-up appointment.    Any Other Special Instructions Will Be Listed Below (If Applicable).     If you need a refill on your cardiac medications before your next appointment, please call your pharmacy.

## 2018-07-09 NOTE — Progress Notes (Signed)
07/09/2018 Vincent Robinson   [birth year]  [id number]  Primary Physician Briscoe, Jannifer Rodney, MD Primary Cardiologist: Lorretta Harp MD [name], [place]  HPI:  Vincent Robinson is a 84 y.o.  married, father of 2, grandfather of 2 grandchildren originally from [place] who lives in [place] now be closer to his family.He is a Theatre stage manager. He was referred by Dr. Doreene Nest for cardiovascular evaluation because of increasing dyspnea on exertion thought not to be a pulmonary etiology.I last saw him in the office  01/08/2018 . He does have a history of hyperlipidemia. He's never had a heart attack or stroke. He had coronary artery bypass grafting 5-10 years ago with recath prior to his endoluminal stent graft 10/09 that showed patent grafts. He had an endoluminal stent graft for abdominal aortic aneurysm in [place] 10/09 with recent Doppler performed 2/18 revealed aortic dimensions of approximately 4.4 x 4.6 cm. Recently had a spontaneous pneumothorax that was treated conservatively as well as a diverticular bleed requiring significant blood transfusion. He is currently on continuous nasal oxygen over the last 2 months. He was begun on a diuretic which improved his breathing. Since I saw him about a month ago we obtained a 2-D echo that showed normal LV systolic function, mild to moderate aortic stenosis with severe pulmonary hypertension. Abdominal Dopplers revealed a abdominal aorta measuring 2.9 cm. He does relate increasing dyspnea on exertion despite supplemental oxygen. He does have 1-2+ pitting edema. There is also had a history of spontaneous pneumothorax on the right hear good breath sounds bilaterally. I'm going to obtain a PA and lateral chest x-ray, double diuretics and obtain a CT rule out chronic PE as well as a pharmacologic Myoview stress test to rule out an ischemic etiology which was performed 10/23/17 and was entirely normal. I'm referring him to the advanced heart  failure clinic for further evaluation . I referred him to Dr. Jeffie Pollock  who performed a right heart cath confirming pulmonary hypertension and began him on Revatio which has resulted in improvement in his symptoms. Since I saw him 6 months ago he continues to be stable denying increasing shortness of breath.  He is on 40 mg of Lasix and has maintained his dry weight.  He is on chronic O2 and BiPAP nightly.  Current Meds  Medication Sig  . albuterol (PROVENTIL HFA;VENTOLIN HFA) 108 (90 Base) MCG/ACT inhaler Inhale 1 puff into the lungs 2 (two) times daily. (Patient taking differently: Inhale 1 puff into the lungs every 4 (four) hours as needed for wheezing or shortness of breath. )  . amoxicillin (AMOXIL) 500 MG tablet Take 4 tablets (2,000 mg total) by mouth as needed (prior to dental work).  Marland Kitchen aspirin (ASPIRIN 81) 81 MG chewable tablet Chew 1 tablet (81 mg total) by mouth daily. Restart in 1 week  . fluticasone (VERAMYST) 27.5 MCG/SPRAY nasal spray Place 1 spray into the nose daily.  . Fluticasone-Salmeterol 113-14 MCG/ACT AEPB INHALE 1 PUFF TWICE A DAY  . furosemide (LASIX) 40 MG tablet Take 1 tablet (40 mg total) by mouth daily.  Marland Kitchen levothyroxine (SYNTHROID, LEVOTHROID) 112 MCG tablet Take 112 mcg by mouth daily before breakfast.  . loratadine (CLARITIN) 10 MG tablet Take 10 mg by mouth daily.  . montelukast (SINGULAIR) 10 MG tablet TAKE ONE TABLET BY MOUTH ONE TIME DAILY AT BEDTIME  . oxybutynin (DITROPAN) 5 MG tablet Take 5 mg by mouth every 8 (eight) hours as needed for bladder spasms.  Marland Kitchen  sildenafil (REVATIO) 20 MG tablet Take 2 tablets (40 mg total) by mouth 3 (three) times daily.  . simvastatin (ZOCOR) 10 MG tablet Take 10 mg by mouth at bedtime.     No Known Allergies  Social History   Socioeconomic History  . Marital status: Married    Spouse name: Not on file  . Number of children: Not on file  . Years of education: Not on file  . Highest education level: Not on file    Occupational History  . Not on file  Social Needs  . Financial resource strain: Not on file  . Food insecurity:    Worry: Not on file    Inability: Not on file  . Transportation needs:    Medical: Not on file    Non-medical: Not on file  Tobacco Use  . Smoking status: Former Smoker    Packs/day: 0.50    Last attempt to quit: 08/06/1961    Years since quitting: 56.9  . Smokeless tobacco: Never Used  Substance and Sexual Activity  . Alcohol use: Not on file  . Drug use: Not on file  . Sexual activity: Not on file  Lifestyle  . Physical activity:    Days per week: Not on file    Minutes per session: Not on file  . Stress: Not on file  Relationships  . Social connections:    Talks on phone: Not on file    Gets together: Not on file    Attends religious service: Not on file    Active member of club or organization: Not on file    Attends meetings of clubs or organizations: Not on file    Relationship status: Not on file  . Intimate partner violence:    Fear of current or ex partner: Not on file    Emotionally abused: Not on file    Physically abused: Not on file    Forced sexual activity: Not on file  Other Topics Concern  . Not on file  Social History Narrative  . Not on file     Review of Systems: General: negative for chills, fever, night sweats or weight changes.  Cardiovascular: negative for chest pain, dyspnea on exertion, edema, orthopnea, palpitations, paroxysmal nocturnal dyspnea or shortness of breath Dermatological: negative for rash Respiratory: negative for cough or wheezing Urologic: negative for hematuria Abdominal: negative for nausea, vomiting, diarrhea, bright red blood per rectum, melena, or hematemesis Neurologic: negative for visual changes, syncope, or dizziness All other systems reviewed and are otherwise negative except as noted above.    Blood pressure 114/72, pulse (!) 58, height 5' 7.5" (1.715 m), weight 186 lb 6.4 oz (84.6 kg), SpO2 92  %.  General appearance: alert and no distress Neck: no adenopathy, no carotid bruit, no JVD, supple, symmetrical, trachea midline and thyroid not enlarged, symmetric, no tenderness/mass/nodules Lungs: clear to auscultation bilaterally Heart: irregularly irregular rhythm Extremities: extremities normal, atraumatic, no cyanosis or edema Pulses: 2+ and symmetric Skin: Skin color, texture, turgor normal. No rashes or lesions Neurologic: Alert and oriented X 3, normal strength and tone. Normal symmetric reflexes. Normal coordination and gait  EKG not performed today  ASSESSMENT AND PLAN:   Hyperlipidemia History of hyperlipidemia on statin therapy followed by his PCP  Coronary artery disease History of CAD status post coronary artery bypass grafting x 5  10 years ago with recath prior to his endoluminal stent graft 10/09 showing patent grafts.  He has a normal EF by 2D echo and  denies chest pain.  AAA (abdominal aortic aneurysm) without rupture ([facility]) History of abdominal aortic aneurysm measuring 4.4 cm status post endoluminal stent grafting in [place].  Follow-up ultrasound revealed his aorta measures 2.9 cm.  Pulmonary hypertension, unspecified ([place]) Severe pulmonary hypertension with pulmonary artery systolic pressures in the 80s on sildenafil followed by Dr. Haroldine Laws  Aortic stenosis, moderate History of moderate aortic stenosis by 2D echo performed 09/10/2017 with an aortic valve measuring 1cm/m and a peak gradient of 26 mill meters of mercury.      Lorretta Harp MD FACP,FACC,FAHA, [facility] 07/09/2018 2:18 PM

## 2018-07-11 ENCOUNTER — Ambulatory Visit: Payer: Medicare Other | Admitting: Cardiovascular Disease

## 2018-08-23 ENCOUNTER — Other Ambulatory Visit (HOSPITAL_COMMUNITY): Payer: Self-pay | Admitting: Internal Medicine

## 2018-09-10 ENCOUNTER — Other Ambulatory Visit (HOSPITAL_COMMUNITY): Payer: Medicare Other

## 2018-09-11 ENCOUNTER — Ambulatory Visit (HOSPITAL_BASED_OUTPATIENT_CLINIC_OR_DEPARTMENT_OTHER)
Admission: RE | Admit: 2018-09-11 | Discharge: 2018-09-11 | Disposition: A | Discharge status: Home / Self Care | Payer: Medicare Other | Source: Ambulatory Visit | Attending: Internal Medicine | Admitting: Internal Medicine

## 2018-09-11 ENCOUNTER — Other Ambulatory Visit: Payer: Self-pay

## 2018-09-11 ENCOUNTER — Ambulatory Visit (HOSPITAL_COMMUNITY)
Admission: RE | Admit: 2018-09-11 | Discharge: 2018-09-11 | Disposition: A | Discharge status: Home / Self Care | Payer: Medicare Other | Source: Ambulatory Visit | Attending: Internal Medicine | Admitting: Internal Medicine

## 2018-09-11 ENCOUNTER — Encounter (HOSPITAL_COMMUNITY): Payer: Self-pay | Admitting: Internal Medicine

## 2018-09-11 VITALS — BP 124/66 | HR 60 | Wt 185.2 lb

## 2018-09-11 DIAGNOSIS — I272 Pulmonary hypertension, unspecified: Secondary | ICD-10-CM | POA: Diagnosis not present

## 2018-09-11 DIAGNOSIS — Z87891 Personal history of nicotine dependence: Secondary | ICD-10-CM | POA: Diagnosis not present

## 2018-09-11 DIAGNOSIS — Z79899 Other long term (current) drug therapy: Secondary | ICD-10-CM | POA: Diagnosis not present

## 2018-09-11 DIAGNOSIS — J9610 Chronic respiratory failure, unspecified whether with hypoxia or hypercapnia: Secondary | ICD-10-CM | POA: Insufficient documentation

## 2018-09-11 DIAGNOSIS — E039 Hypothyroidism, unspecified: Secondary | ICD-10-CM | POA: Diagnosis not present

## 2018-09-11 DIAGNOSIS — I2510 Atherosclerotic heart disease of native coronary artery without angina pectoris: Secondary | ICD-10-CM | POA: Diagnosis not present

## 2018-09-11 DIAGNOSIS — I083 Combined rheumatic disorders of mitral, aortic and tricuspid valves: Secondary | ICD-10-CM | POA: Insufficient documentation

## 2018-09-11 DIAGNOSIS — E785 Hyperlipidemia, unspecified: Secondary | ICD-10-CM | POA: Insufficient documentation

## 2018-09-11 DIAGNOSIS — J45991 Cough variant asthma: Secondary | ICD-10-CM | POA: Insufficient documentation

## 2018-09-11 DIAGNOSIS — G4733 Obstructive sleep apnea (adult) (pediatric): Secondary | ICD-10-CM | POA: Diagnosis not present

## 2018-09-11 DIAGNOSIS — Z951 Presence of aortocoronary bypass graft: Secondary | ICD-10-CM | POA: Insufficient documentation

## 2018-09-11 DIAGNOSIS — Z836 Family history of other diseases of the respiratory system: Secondary | ICD-10-CM | POA: Diagnosis not present

## 2018-09-11 DIAGNOSIS — I350 Nonrheumatic aortic (valve) stenosis: Secondary | ICD-10-CM | POA: Diagnosis not present

## 2018-09-11 DIAGNOSIS — Z7982 Long term (current) use of aspirin: Secondary | ICD-10-CM | POA: Diagnosis not present

## 2018-09-11 DIAGNOSIS — G4730 Sleep apnea, unspecified: Secondary | ICD-10-CM | POA: Insufficient documentation

## 2018-09-11 NOTE — Addendum Note (Signed)
Encounter addended by: Marlise Eves, RN on: 09/11/2018 3:39 PM  Actions taken: Sign clinical note

## 2018-09-11 NOTE — Progress Notes (Signed)
  Echocardiogram 2D Echocardiogram has been performed.  09/11/2018, 2:39 PM

## 2018-09-11 NOTE — Progress Notes (Signed)
ADVANCED HF CLINIC CONSULT NOTE  Referring Physician: Dr. Gwenlyn Found  Primary Cardiologist: Dr. Gwenlyn Found   HPI:  Mr. Hanshaw is 84 year old with past history of COPD, hypersomnia with sleep apnea, allergic rhinitis, cough variant asthma. He recently moved from [place] to be close to his family and wants to establish care here. He is chief complaints of dyspnea on exertion. He has history of sleep apnea on BiPAP at settings of 20/6. This is report of CT of the chest from [place] which shows calcified nodule, chronic scarring. 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 He moved to [place] in 1956. Apparently his immigration was delayed due to a lung abnormality on imaging. He was eventually cleared. He does not have any history of TB, exposure to TB. He's had an episode of influenza while in [place].  Hospitalized in July, 2018 for a spontaneous pneumothorax that was treated conservatively. He was again readmitted in August 2018 for lower GI bleed (diverticular). During this admission he was noted to be in acute hypoxic respiratory failure secondary to diastolic heart failure. He improved with diuresis.  He had coronary artery bypass grafting in 1998. He had an endoluminal stent graft for abdominal aortic aneurysm in [place] 10/09 . Pre-op cath showed patent grafts.  Doppler performed 2/18 revealed aortic dimensions of approximately 4.4 x 4.6 cm.   Has been followed by Dr. Vaughan Browner. Smoked a little bit in remote past but quit a long time ago. No h/o connective tissue disease. Wears BIPAP for severe OSA. Has been on home O2 since 2017. Dr. Vaughan Browner has arranged for BIPAP titration and repeat hi-res CT. BiPAP download December 2017-January 2018. AHI 7.2  6MW 02/2018: 820 feet, O2 sats 89-92% on 2L, HR 98-123  He returns today for f/u of pulmonary HTN. Remains on sildenafil 40 mg  TID. Asking if it can be decreased due to loose stools. Takes lasix 40 mg daily. Also wears compression hose. No edema. Wears O2. Can do ADLs without problem. Doesn't feel like he is slowing down. Has a cough with clear sputum. No fevers or chills. Weight stable 178-180. Limits salt intake. Drinks less than 2 L/day. No palpitations, syncope or presyncope. Wears Bipap at night. O2 sats 94% on 2L 87% on RA.   Studies:  RHC 12/11/17 RA = 6 RV = 72/7 PA = 73/27 (44) PCW = 10 Fick cardiac output/index = 4.1/2.1 PVR = 8.4 WU Ao sat = 91% PA sat = 58%, 58%  Echo 11/18 EF 60-65% grade I DD  RV severely dilated moderate TR RVSP 79mmHG   VQ 12/18 Normal perfusion lung scan.  Irregular peripheral ventilation in both lungs consistent with chronic parenchymal lung disease changes.  PFTs 02/28/16 FVC 3.37 (116%), FEV1 2.08 (108%), F/F 62 Mild obstructive airway disease, no broncho-dilator response  CT scan 07/15/15- 2 .3 x 1.7 cm right upper lobe calcified opacity likely to represent pleural , parenchymal scarring. Mild centrilobular, paraseptal emphysema. Subpleural reticulation, fibrosis representing mild chronic ILD High-resolution CT 09/04/16 - stable right upper lobe scarring, hyper aerated lungs. Minimal interstitial lung disease and peripheral unchanged compared to 2016.  Hi-res CT: 01/06/18: Stable ILD   Review of systems complete and found to be negative unless listed in HPI.   Past Medical History:  Diagnosis Date  . AAA (abdominal aortic aneurysm) ([place])   . Asthma   .  Basal cell carcinoma   . COPD (chronic obstructive pulmonary disease) ([place])   . Coronary artery disease   . Hyperlipidemia   . Hypothyroidism   . Skin cancer 2017  . Sleep apnea     Current Outpatient Medications  Medication Sig Dispense Refill  . aspirin (ASPIRIN 81) 81 MG chewable tablet Chew 1 tablet (81 mg total) by mouth daily. Restart in 1 week    . furosemide (LASIX) 40 MG tablet Take 1 tablet (40 mg  total) by mouth daily. 60 tablet 11  . sildenafil (REVATIO) 20 MG tablet TAKE TWO TABLETS BY MOUTH THREE TIMES DAILY  180 tablet 3  . simvastatin (ZOCOR) 10 MG tablet Take 10 mg by mouth at bedtime.  3  . albuterol (PROVENTIL HFA;VENTOLIN HFA) 108 (90 Base) MCG/ACT inhaler Inhale 1 puff into the lungs 2 (two) times daily. 1 Inhaler 2  . amoxicillin (AMOXIL) 500 MG tablet Take 4 tablets (2,000 mg total) by mouth as needed (prior to dental work). 4 tablet 1  . fluticasone (VERAMYST) 27.5 MCG/SPRAY nasal spray Place 1 spray into the nose daily.    . Fluticasone-Salmeterol 113-14 MCG/ACT AEPB INHALE 1 PUFF TWICE A DAY 1 each 5  . levothyroxine (SYNTHROID, LEVOTHROID) 112 MCG tablet Take 112 mcg by mouth daily before breakfast.    . loratadine (CLARITIN) 10 MG tablet Take 10 mg by mouth daily.    . montelukast (SINGULAIR) 10 MG tablet TAKE ONE TABLET BY MOUTH ONE TIME DAILY AT BEDTIME 30 tablet 5  . oxybutynin (DITROPAN) 5 MG tablet Take 5 mg by mouth every 8 (eight) hours as needed for bladder spasms.     No current facility-administered medications for this encounter.     No Known Allergies    Social History   Socioeconomic History  . Marital status: Married    Spouse name: Not on file  . Number of children: Not on file  . Years of education: Not on file  . Highest education level: Not on file  Occupational History  . Not on file  Social Needs  . Financial resource strain: Not on file  . Food insecurity:    Worry: Not on file    Inability: Not on file  . Transportation needs:    Medical: Not on file    Non-medical: Not on file  Tobacco Use  . Smoking status: Former Smoker    Packs/day: 0.50    Last attempt to quit: 08/06/1961    Years since quitting: 57.1  . Smokeless tobacco: Never Used  Substance and Sexual Activity  . Alcohol use: Not on file  . Drug use: Not on file  . Sexual activity: Not on file  Lifestyle  . Physical activity:    Days per week: Not on file     Minutes per session: Not on file  . Stress: Not on file  Relationships  . Social connections:    Talks on phone: Not on file    Gets together: Not on file    Attends religious service: Not on file    Active member of club or organization: Not on file    Attends meetings of clubs or organizations: Not on file    Relationship status: Not on file  . Intimate partner violence:    Fear of current or ex partner: Not on file    Emotionally abused: Not on file    Physically abused: Not on file    Forced sexual activity: Not on file  Other  Topics Concern  . Not on file  Social History Narrative  . Not on file      Family History  Problem Relation Age of Onset  . Emphysema Mother   . Stroke Father   . Movement disorder Sister   . Aneurysm Brother        Thoracic aorta  . Macular degeneration Brother     Vitals:   09/11/18 1448  BP: 124/66  Pulse: 60  SpO2: 94%  Weight: 84 kg (185 lb 3.2 oz)   Wt Readings from Last 3 Encounters:  09/11/18 84 kg (185 lb 3.2 oz)  07/09/18 84.6 kg (186 lb 6.4 oz)  06/09/18 82.1 kg (181 lb)    PHYSICAL EXAM: General:  Elderly No resp difficulty HEENT: normal Neck: supple. JVP 8. Carotids 2+ bilat; + radiated bruits. No lymphadenopathy or thryomegaly appreciated. Cor: PMI nondisplaced. Regular rate & rhythm. 2/6 AS s2 ok  Lungs: moderately reduced breath sounds no wheeze Abdomen: soft, nontender, nondistended. No hepatosplenomegaly. No bruits or masses. Good bowel sounds. Extremities: no cyanosis, clubbing, rash, 1+ ankle edema Neuro: alert & orientedx3, cranial nerves grossly intact. moves all 4 extremities w/o difficulty. Affect pleasant  ASSESSMENT & PLAN:  1. Pulmonary hypertension with cor pulmonale - Echo, CT, PFTs and VQ studies reviewed personally - He has evidence of significant pulmonary HTN with severe RV failure. Etiology remains unclear. Symptoms improved with diuresis - CT with evidence of ILD but PFTs not that bad. VQ  negative.  - Suspect PH multifactorial but PAH out of proportion to LV pressures and ILD so suspect he must have component of Group I disease - RHC 2/19 c/w moderate PAH - Doing well with sildenafil without evidence of shunting. Continue 40 mg tid  Can cut back to 20 tid if needed due to GI side effects - Continue lasix 40 mg daily - Have previously  suggested referral to Pulmonary rehab but he refuses - Continue compression stockings  - Would not add additional agent at this point.   2. Chronic respiratory failure - evidence of mild ILD on CT. PFTs not too bad - has severe OSA on Bipap. Continue BiPAP qHS - continue supplemental O2 - managed by Pulmonary. No change.   3. CAD s/p CABG - No s/s ischemia. Myoview 1/19 normal.  - Managed by Dr. Gwenlyn Found. Ok to cut ASA back to every other day due to severe bruising  4. Aortic stenosis - Moderate by echo 11/18. No change. Repeat echo 1 year     Glori Bickers, MD  3:27 PM

## 2018-09-11 NOTE — Patient Instructions (Signed)
Follow up with Dr. Haroldine Laws in 6 months.

## 2018-09-12 ENCOUNTER — Telehealth (HOSPITAL_COMMUNITY): Payer: Self-pay

## 2018-09-12 ENCOUNTER — Other Ambulatory Visit (HOSPITAL_COMMUNITY): Payer: Medicare Other

## 2018-09-12 ENCOUNTER — Encounter (HOSPITAL_COMMUNITY): Payer: Medicare Other | Admitting: Internal Medicine

## 2018-09-12 NOTE — Telephone Encounter (Signed)
Pt called with ECHO results  

## 2019-01-07 ENCOUNTER — Telehealth: Payer: Self-pay

## 2019-01-07 NOTE — Telephone Encounter (Signed)
COVID-19 Pre-Screening Questions:  Provider: BERRY   Needs f/u  >6 weeks may be 3 months  . Have you been in contact with someone that was recently sick with fever/cough or confirmed to have the [place] virus?  NO  *Contact with a confirmed case should stay at home, away from confirmed patient, monitor symptoms, and reach out to PCP for e-visit/additional testing.  2. Do you have any of the following symptoms [cough, fever (100.4 or greater)], and/or shortness of breath)?  NO  *ALL PTS W/ FEVER SHOULD BE REFERRED TO PCP FOR E-VISIT* _________________________________________________  Cardiac Questionnaire:    Since your last visit or hospitalization:    1. Have you been having chest pain? NO   2. Have you been having shortness of breath? NO   3. Have you been having increasing edema, wt gain, or increase in abdominal girth (pants fitting more tightly)? NO   4. Have you had any passing out spells? NO    *A YES to any of these questions would result in the appointment being kept.  *If all the answers to these questions are NO, we should indicate that given the current situation regarding the worldwide coronarvirus pandemic, at the recommendation of the CDC, we are looking to limit gatherings in our waiting area, and thus will reschedule their appointment beyond four weeks from today.

## 2019-01-12 ENCOUNTER — Ambulatory Visit: Payer: Medicare Other | Admitting: Adult Health

## 2019-01-15 ENCOUNTER — Ambulatory Visit: Payer: Medicare Other | Admitting: Pulmonary Disease

## 2019-01-30 ENCOUNTER — Other Ambulatory Visit (HOSPITAL_COMMUNITY): Payer: Self-pay | Admitting: Internal Medicine

## 2019-03-02 ENCOUNTER — Other Ambulatory Visit: Payer: Self-pay | Admitting: Pulmonary Disease

## 2019-03-18 ENCOUNTER — Encounter (HOSPITAL_COMMUNITY): Payer: Medicare Other | Admitting: Internal Medicine

## 2019-05-11 ENCOUNTER — Other Ambulatory Visit: Payer: Self-pay

## 2019-05-11 ENCOUNTER — Ambulatory Visit (HOSPITAL_COMMUNITY)
Admission: RE | Admit: 2019-05-11 | Discharge: 2019-05-11 | Disposition: A | Discharge status: Home / Self Care | Payer: Medicare Other | Source: Ambulatory Visit | Attending: Internal Medicine | Admitting: Internal Medicine

## 2019-05-11 DIAGNOSIS — I2510 Atherosclerotic heart disease of native coronary artery without angina pectoris: Secondary | ICD-10-CM

## 2019-05-11 DIAGNOSIS — I350 Nonrheumatic aortic (valve) stenosis: Secondary | ICD-10-CM

## 2019-05-11 DIAGNOSIS — I272 Pulmonary hypertension, unspecified: Secondary | ICD-10-CM

## 2019-05-11 MED ORDER — MACITENTAN 10 MG PO TABS: 10.0000 mg | ORAL_TABLET | Freq: Every day | ORAL | 6 refills | Status: DC

## 2019-05-11 NOTE — Addendum Note (Signed)
Encounter addended by: Marlise Eves, RN on: 05/11/2019 4:08 PM  Actions taken: Pharmacy for encounter modified, Order list changed, Clinical Note Signed

## 2019-05-11 NOTE — Progress Notes (Signed)
Lets try to get approval for Macitentan 10mg  daily (Opsumit)   F/ 3 months    Called pt to advise him on taking Opsumit. Told pt about the prior authorization process. He said he would call back in order to tell us which pharmacy he prefers. F/u appointment made. No further questions.

## 2019-05-11 NOTE — Progress Notes (Signed)
Heart Failure TeleHealth Note  Due to national recommendations of social distancing due to [place] 19, Audio/video telehealth visit is felt to be most appropriate for this patient Robinson this time.  See MyChart message from today for patient consent regarding telehealth for Vincent Robinson [facility].  Date:  05/11/2019   ID:  Vincent Robinson, DOB [birth year], MRN [id number]  Location: Home  Provider location: [facility] Type of Visit: Established patient  PCP:  Katherina Mires, MD  Cardiologist:  Quay Burow, MD Primary HF: Saleema Weppler  Chief Complaint: Pulmonary HTN follow-up   History of Present Illness:  Vincent Robinson is 85 year old with past history of COPD, hypersomnia with sleep apnea, allergic rhinitis, cough variant asthma. He recently moved from [place] to be close to his family and wants to establish care here. He is chief complaints of dyspnea on exertion. He has history of sleep apnea on BiPAP Robinson settings of 20/6. This is report of CT of the chest from [place] which shows calcified nodule, chronic scarring. 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 He moved to [place] in 1956. Apparently his immigration was delayed due to a lung abnormality on imaging. He was eventually cleared. He does not have any history of TB, exposure to TB. He's had an episode of influenza while in [place].  Hospitalized in July, 2071for a spontaneous pneumothorax that was treated conservatively. He was again readmitted in August 2044for lower GI bleed (diverticular). During this admission he was noted to be in acute hypoxic respiratory failure secondary to diastolic heart failure. He improved with diuresis.  He had coronary artery bypass grafting in 1998. He had an endoluminal stent graft for abdominal aortic aneurysm in [place] 10/09 . Pre-op cath showed patent  grafts.  Doppler performed 2/18 revealed aortic dimensions of approximately 4.4 x 4.6 cm.   Has been followed by Dr. Vaughan Browner. Smoked a little bit in remote past but quit a long time ago. No h/o connective tissue disease. Wears BIPAP for severe OSA. Has been on home O2 since 2017. Dr. Vaughan Browner has arranged for BIPAP titration and repeat hi-res CT. BiPAP download December 2017-January 2018. AHI 7.2  RHC 2/19 RA 6 PA 73/27 (44) PCW 10  Fick 4.2/2.1 PVR 8.4 WU  6MW 02/2018: 820 feet, O2 sats 89-92% on 2L, HR 98-123   Echo 11/19 EF 60-65% RV moderately dilated mild to moderate reduced function. Moderate TR RVSP 25mmHG  Mild AS mean gradient 75mmHG  He presents via audio/video conferencing for a telehealth visit today for f/u of pulmonary HTN. Remains on sildenafil 40 mg TID.  Says he is doing fine. Says he gets SOB with just mild activity. Says he hasn't noticed much change. Can get to mailbox and back and that's about it. No syncope or presyncope. Occasional edema if he doesn't wear stocking but otherwise ok. No orthopnea or PND. Lasix cut down to 20mg  daily. Weight is down about 10 pounds.  Wears Bipap Robinson night. O2 sats 94% on 2L 87% on RA. Still with mucous cough.   Vincent Robinson denies symptoms worrisome for COVID 19.   Past Medical History:  Diagnosis Date  . AAA (abdominal aortic aneurysm) ([place])   . Asthma   . Basal cell carcinoma   . COPD (chronic obstructive pulmonary disease) ([place])   . Coronary artery disease   .  Hyperlipidemia   . Hypothyroidism   . Skin cancer 2017  . Sleep apnea    Past Surgical History:  Procedure Laterality Date  . bleeding ulcer  2016  . BYPASS AXILLA/BRACHIAL ARTERY  1998  . colon bleed  2012  . COLONOSCOPY N/A 06/07/2017   Procedure: COLONOSCOPY;  Surgeon: Carol Ada, MD;  Location: [facility];  Service: Endoscopy;  Laterality: N/A;  . DEBRIDMENT OF DECUBITUS ULCER  2002  . RIGHT HEART CATH N/A 12/11/2017   Procedure: RIGHT HEART CATH;  Surgeon:  Jolaine Artist, MD;  Location: [facility];  Service: Cardiovascular;  Laterality: N/A;  . THORACIC AORTA STENT  2009     Current Outpatient Medications  Medication Sig Dispense Refill  . albuterol (PROVENTIL HFA;VENTOLIN HFA) 108 (90 Base) MCG/ACT inhaler Inhale 1 puff into the lungs 2 (two) times daily. 1 Inhaler 2  . amoxicillin (AMOXIL) 500 MG tablet Take 4 tablets (2,000 mg total) by mouth as needed (prior to dental work). 4 tablet 1  . aspirin (ASPIRIN 81) 81 MG chewable tablet Chew 1 tablet (81 mg total) by mouth daily. Restart in 1 week    . fluticasone (VERAMYST) 27.5 MCG/SPRAY nasal spray Place 1 spray into the nose daily.    . Fluticasone-Salmeterol 113-14 MCG/ACT AEPB INHALE 1 PUFF TWICE A DAY 1 each 5  . furosemide (LASIX) 40 MG tablet Take 1 tablet (40 mg total) by mouth daily. 60 tablet 11  . levothyroxine (SYNTHROID, LEVOTHROID) 112 MCG tablet Take 112 mcg by mouth daily before breakfast.    . loratadine (CLARITIN) 10 MG tablet Take 10 mg by mouth daily.    . montelukast (SINGULAIR) 10 MG tablet TAKE ONE TABLET BY MOUTH Robinson BEDTIME 30 tablet 2  . oxybutynin (DITROPAN) 5 MG tablet Take 5 mg by mouth every 8 (eight) hours as needed for bladder spasms.    . sildenafil (REVATIO) 20 MG tablet TAKE TWO TABLETS BY MOUTH THREE TIMES A DAY 540 tablet 0  . simvastatin (ZOCOR) 10 MG tablet Take 10 mg by mouth Robinson bedtime.  3   No current facility-administered medications for this encounter.     Allergies:   Patient has no known allergies.   Social History:  The patient  reports that he quit smoking about 57 years ago. He smoked 0.50 packs per day. He has never used smokeless tobacco.   Family History:  The patient's family history includes Aneurysm in his brother; Emphysema in his mother; Macular degeneration in his brother; Movement disorder in his sister; Stroke in his father.   ROS:  Please see the history of present illness.   All other systems are personally reviewed  and negative.   Exam:  (Video/Tele Health Call; Exam is subjective and or/visual.) General:  Speaks in full sentences. No resp difficulty. Lungs: Normal respiratory effort with conversation.  Abdomen: Non-distended per patient report Extremities: Pt denies edema. Neuro: Alert & oriented x 3.   Recent Labs: No results found for requested labs within last 8760 hours.  Personally reviewed   Wt Readings from Last 3 Encounters:  09/11/18 84 kg (185 lb 3.2 oz)  07/09/18 84.6 kg (186 lb 6.4 oz)  06/09/18 82.1 kg (181 lb)      ASSESSMENT AND PLAN:  1. Pulmonary hypertension with cor pulmonale - Echo, CT, PFTs and VQ studies reviewed personally - He has evidence of significant pulmonary HTN with severe RV failure. Etiology remains unclear. Symptoms improved with diuresis - CT with evidence of  ILD but PFTs not that bad. VQ negative.  - Suspect PH multifactorial but PAH out of proportion to LV pressures and ILD so suspect he must have component of Group I disease - RHC 2/19 c/w moderate PAH. Mild to moderate RV dysfunction. - Echo 11/19 with worsening PAH RVSP = 9mmHG - NYHA II-III - Doing well with sildenafil without evidence of shunting. Continue 40 mg tid   - Continue lasix 20 mg daily. Can take 40 as needed. - Will try macitentan 10mg  daily. Follow closely.   2. Chronic respiratory failure - evidence of mild ILD on CT. PFTs not too bad - has severe OSA on Bipap. Continue BiPAP qHS - continue supplemental O2 - managed by Pulmonary. No change.   3. CAD s/p CABG - No s/s ischemia. Myoview 1/19 normal.  - Managed by Dr. Gwenlyn Found. Ok to cut ASA back to every other day due to severe bruising  4. Aortic stenosis - Mild by echo 11/19. No change.     COVID screen The patient does not have any symptoms that suggest any further testing/ screening Robinson this time.  Social distancing reinforced today.  Recommended follow-up:  As above  Relevant cardiac medications were reviewed Robinson  length with the patient today.   The patient does not have concerns regarding their medications Robinson this time.   The following changes were made today:  As above  Today, I have spent 22 minutes with the patient with telehealth technology discussing the above issues .    Signed, Glori Bickers, MD  05/11/2019 2:24 PM  [facility] [address] [facility] [place] [address] [phone] (office) [phone] (fax)

## 2019-05-11 NOTE — Patient Instructions (Signed)
START Opsumit 10mg . 1 tab daily.  Follow up with the [facility] in 3 months.  At the [facility], you and your health needs are our priority. As part of our continuing mission to provide you with exceptional heart care, we have created designated Provider Care Teams. These Care Teams include your primary Cardiologist (physician) and Advanced Practice Providers (APPs- Physician Assistants and Nurse Practitioners) who all work together to provide you with the care you need, when you need it.   You may see any of the following providers on your designated Care Team at your next follow up: Marland Kitchen Dr Glori Bickers . Dr Loralie Champagne . Darrick Grinder, NP

## 2019-06-01 ENCOUNTER — Other Ambulatory Visit (HOSPITAL_COMMUNITY): Payer: Self-pay | Admitting: Internal Medicine

## 2019-06-01 ENCOUNTER — Telehealth: Payer: Self-pay | Admitting: Cardiovascular Disease

## 2019-06-01 MED ORDER — FUROSEMIDE 40 MG PO TABS: 40.0000 mg | ORAL_TABLET | Freq: Every day | ORAL | 0 refills | Status: DC

## 2019-06-01 NOTE — Telephone Encounter (Signed)
New message    *STAT* If patient is at the pharmacy, call can be transferred to refill team.   1. Which medications need to be refilled? (please list name of each medication and dose if known)   furosemide (LASIX) 40 MG tablet     2. Which pharmacy/location (including street and city if local pharmacy) is medication to be sent to?[facility] at [place], [place]  3. Do they need a 30 day or 90 day supply? [place]

## 2019-06-01 NOTE — Telephone Encounter (Signed)
Rx(s) sent to pharmacy electronically.  

## 2019-06-01 NOTE — Telephone Encounter (Signed)
rerouted to [facility]l refills

## 2019-07-11 ENCOUNTER — Other Ambulatory Visit: Payer: Self-pay

## 2019-07-11 ENCOUNTER — Inpatient Hospital Stay (HOSPITAL_COMMUNITY)
Admission: EM | Admit: 2019-07-11 | Discharge: 2019-07-14 | DRG: 378 | Disposition: A | Discharge status: Home / Self Care | Payer: Medicare Other | Attending: Internal Medicine | Admitting: Internal Medicine

## 2019-07-11 DIAGNOSIS — J449 Chronic obstructive pulmonary disease, unspecified: Secondary | ICD-10-CM | POA: Diagnosis present

## 2019-07-11 DIAGNOSIS — Z20828 Contact with and (suspected) exposure to other viral communicable diseases: Secondary | ICD-10-CM | POA: Diagnosis present

## 2019-07-11 DIAGNOSIS — I714 Abdominal aortic aneurysm, without rupture, unspecified: Secondary | ICD-10-CM | POA: Diagnosis present

## 2019-07-11 DIAGNOSIS — I5032 Chronic diastolic (congestive) heart failure: Secondary | ICD-10-CM | POA: Diagnosis present

## 2019-07-11 DIAGNOSIS — Z79899 Other long term (current) drug therapy: Secondary | ICD-10-CM

## 2019-07-11 DIAGNOSIS — I272 Pulmonary hypertension, unspecified: Secondary | ICD-10-CM | POA: Diagnosis present

## 2019-07-11 DIAGNOSIS — I2510 Atherosclerotic heart disease of native coronary artery without angina pectoris: Secondary | ICD-10-CM | POA: Diagnosis present

## 2019-07-11 DIAGNOSIS — K922 Gastrointestinal hemorrhage, unspecified: Secondary | ICD-10-CM

## 2019-07-11 DIAGNOSIS — E785 Hyperlipidemia, unspecified: Secondary | ICD-10-CM | POA: Diagnosis present

## 2019-07-11 DIAGNOSIS — Z85828 Personal history of other malignant neoplasm of skin: Secondary | ICD-10-CM

## 2019-07-11 DIAGNOSIS — Z87891 Personal history of nicotine dependence: Secondary | ICD-10-CM

## 2019-07-11 DIAGNOSIS — Z79890 Hormone replacement therapy (postmenopausal): Secondary | ICD-10-CM

## 2019-07-11 DIAGNOSIS — Z951 Presence of aortocoronary bypass graft: Secondary | ICD-10-CM

## 2019-07-11 DIAGNOSIS — K5731 Diverticulosis of large intestine without perforation or abscess with bleeding: Secondary | ICD-10-CM | POA: Diagnosis not present

## 2019-07-11 DIAGNOSIS — G4733 Obstructive sleep apnea (adult) (pediatric): Secondary | ICD-10-CM | POA: Diagnosis present

## 2019-07-11 DIAGNOSIS — J849 Interstitial pulmonary disease, unspecified: Secondary | ICD-10-CM | POA: Diagnosis present

## 2019-07-11 DIAGNOSIS — E039 Hypothyroidism, unspecified: Secondary | ICD-10-CM | POA: Diagnosis present

## 2019-07-11 DIAGNOSIS — K625 Hemorrhage of anus and rectum: Secondary | ICD-10-CM | POA: Diagnosis present

## 2019-07-11 DIAGNOSIS — Z7982 Long term (current) use of aspirin: Secondary | ICD-10-CM

## 2019-07-11 DIAGNOSIS — J8410 Pulmonary fibrosis, unspecified: Secondary | ICD-10-CM | POA: Diagnosis present

## 2019-07-11 DIAGNOSIS — D62 Acute posthemorrhagic anemia: Secondary | ICD-10-CM | POA: Diagnosis present

## 2019-07-11 DIAGNOSIS — C679 Malignant neoplasm of bladder, unspecified: Secondary | ICD-10-CM | POA: Diagnosis present

## 2019-07-11 DIAGNOSIS — Z9981 Dependence on supplemental oxygen: Secondary | ICD-10-CM

## 2019-07-11 DIAGNOSIS — G4730 Sleep apnea, unspecified: Secondary | ICD-10-CM | POA: Diagnosis present

## 2019-07-11 LAB — POC OCCULT BLOOD, ED: Fecal Occult Bld: POSITIVE — AB

## 2019-07-11 MED ORDER — SODIUM CHLORIDE 0.9 % IV SOLN
INTRAVENOUS | Status: DC
  Administered 2019-07-12: via INTRAVENOUS

## 2019-07-11 NOTE — ED Provider Notes (Signed)
TIME SEEN: 11:29 PM  CHIEF COMPLAINT: GI bleed  HPI: Patient is a 85 year old male with history of hyperlipidemia, CAD, COPD on 2 L of oxygen, AAA who presents to the emergency department with rectal bleeding.  States he saw small amounts of blood with bowel movements over the past couple of days and then had a large bloody bowel movement tonight.  No abdominal pain.  No chest pain or shortness of breath other than his chronic shortness of breath.  No fever or productive cough.  He has had previous GI bleed in the past from a diverticular bleed in 2018 and had colonoscopy at that time by Dr. Benson Norway.  Required a blood transfusion.  ROS: See HPI Constitutional: no fever  Eyes: no drainage  ENT: no runny nose   Cardiovascular:  no chest pain  Resp: no SOB  GI: no vomiting GU: no dysuria Integumentary: no rash  Allergy: no hives  Musculoskeletal: no leg swelling  Neurological: no slurred speech ROS otherwise negative  PAST MEDICAL HISTORY/PAST SURGICAL HISTORY:  Past Medical History:  Diagnosis Date  . AAA (abdominal aortic aneurysm) ([place])   . Asthma   . Basal cell carcinoma   . COPD (chronic obstructive pulmonary disease) ([place])   . Coronary artery disease   . Hyperlipidemia   . Hypothyroidism   . Skin cancer 2017  . Sleep apnea     MEDICATIONS:  Prior to Admission medications   Medication Sig Start Date End Date Taking? Authorizing Provider  albuterol (PROVENTIL HFA;VENTOLIN HFA) 108 (90 Base) MCG/ACT inhaler Inhale 1 puff into the lungs 2 (two) times daily. 05/20/17   Mannam, Hart Robinsons, MD  amoxicillin (AMOXIL) 500 MG tablet Take 4 tablets (2,000 mg total) by mouth as needed (prior to dental work). 03/06/18   Bensimhon, Shaune Pascal, MD  aspirin (ASPIRIN 81) 81 MG chewable tablet Chew 1 tablet (81 mg total) by mouth daily. Restart in 1 week 06/14/17   Domenic Polite, MD  fluticasone (VERAMYST) 27.5 MCG/SPRAY nasal spray Place 1 spray into the nose daily.    [provider]   Fluticasone-Salmeterol 113-14 MCG/ACT AEPB INHALE 1 PUFF TWICE A DAY 03/12/18   Mannam, Praveen, MD  furosemide (LASIX) 40 MG tablet Take 1 tablet (40 mg total) by mouth daily. 06/01/19   Lorretta Harp, MD  levothyroxine (SYNTHROID, LEVOTHROID) 112 MCG tablet Take 112 mcg by mouth daily before breakfast.    [provider]  loratadine (CLARITIN) 10 MG tablet Take 10 mg by mouth daily.    [provider]  macitentan (OPSUMIT) 10 MG tablet Take 1 tablet (10 mg total) by mouth daily. 05/11/19   Bensimhon, Shaune Pascal, MD  montelukast (SINGULAIR) 10 MG tablet TAKE ONE TABLET BY MOUTH AT BEDTIME 03/02/19   Mannam, Praveen, MD  oxybutynin (DITROPAN) 5 MG tablet Take 5 mg by mouth every 8 (eight) hours as needed for bladder spasms.    [provider]  sildenafil (REVATIO) 20 MG tablet TAKE TWO TABLETS BY MOUTH THREE TIMES A DAY 06/01/19   Bensimhon, Shaune Pascal, MD  simvastatin (ZOCOR) 10 MG tablet Take 10 mg by mouth at bedtime. 03/22/17   [provider]    ALLERGIES:  No Known Allergies  SOCIAL HISTORY:  Social History   Tobacco Use  . Smoking status: Former Smoker    Packs/day: 0.50    Quit date: 08/06/1961    Years since quitting: 57.9  . Smokeless tobacco: Never Used  Substance Use Topics  . Alcohol use: Not  on file    FAMILY HISTORY: Family History  Problem Relation Age of Onset  . Emphysema Mother   . Stroke Father   . Movement disorder Sister   . Aneurysm Brother        Thoracic aorta  . Macular degeneration Brother     EXAM: BP 124/61 (BP Location: Left Arm)   Pulse 85   Temp 98 F (36.7 C) (Oral)   Resp (!) 26   SpO2 93%  CONSTITUTIONAL: Alert and oriented and responds appropriately to questions.  Elderly, in no distress HEAD: Normocephalic EYES: Conjunctivae clear, pupils appear equal, EOMI ENT: normal nose; moist mucous membranes NECK: Supple, no meningismus, no nuchal rigidity, no LAD  CARD: RRR; S1 and S2 appreciated; no murmurs,  no clicks, no rubs, no gallops RESP: Normal chest excursion without splinting or tachypnea; breath sounds clear and equal bilaterally; no wheezes, no rhonchi, no rales, no hypoxia or respiratory distress, speaking full sentences ABD/GI: Normal bowel sounds; non-distended; soft, non-tender, no rebound, no guarding, no peritoneal signs, no hepatosplenomegaly RECTAL: Large amount of maroon-colored blood on rectal exam, no hemorrhoids, nontender rectal exam, no melena, guaiac positive BACK:  The back appears normal and is non-tender to palpation, there is no CVA tenderness EXT: Normal ROM in all joints; non-tender to palpation; no edema; normal capillary refill; no cyanosis, no calf tenderness or swelling    SKIN: Normal color for age and race; warm; no rash NEURO: Moves all extremities equally PSYCH: The patient's mood and manner are appropriate. Grooming and personal hygiene are appropriate.  MEDICAL DECISION MAKING: Patient here with lower GI bleed.  Suspect another diverticular bleed.  Denies pain.  On my evaluation patient's blood pressure is 99 systolic.  Improved on recheck.  Will start IV hydration and obtain labs.  Anticipate observation admission.  Patient would prefer to be observed given he states he became very ill the last time he had a GI bleed and required a blood transfusion.  ED PROGRESS: Patient's labs are rather reassuring.  His hemoglobin is 14.6 and his blood pressures continue to be stable now in the 120s/60s.  Will discuss with hospitalist for admission.  Will monitor closely.  No further episodes of bleeding in the ED.  12:59 AM Discussed patient's case with hospitalist, Dr. Maudie Mercury.  I have recommended admission and patient (and family if present) agree with this plan. Admitting physician will place admission orders.   I reviewed all nursing notes, vitals, pertinent previous records, EKGs, lab and urine results, imaging (as available).  CRITICAL CARE Performed by: Pryor Curia   Total critical care time: 45 minutes  Critical care time was exclusive of separately billable procedures and treating other patients.  Critical care was necessary to treat or prevent imminent or life-threatening deterioration.  Critical care was time spent personally by me on the following activities: development of treatment plan with patient and/or surrogate as well as nursing, discussions with consultants, evaluation of patient's response to treatment, examination of patient, obtaining history from patient or surrogate, ordering and performing treatments and interventions, ordering and review of laboratory studies, ordering and review of radiographic studies, pulse oximetry and re-evaluation of patient's condition.    Ward, Delice Bison, DO 07/12/19 0102

## 2019-07-11 NOTE — ED Triage Notes (Signed)
Per EMS: Pt is coming from home with c/o rectal bleeding since this morning. The patients LBM was forty minutes ago with reports of bright red blood in stool. Pt denies pain and no N/V. Patients vitals were WDL. Hx of COPD and is wearing 2L of oxygen.

## 2019-07-12 ENCOUNTER — Encounter (HOSPITAL_COMMUNITY): Payer: Self-pay | Admitting: Internal Medicine

## 2019-07-12 DIAGNOSIS — D649 Anemia, unspecified: Secondary | ICD-10-CM | POA: Diagnosis not present

## 2019-07-12 DIAGNOSIS — J449 Chronic obstructive pulmonary disease, unspecified: Secondary | ICD-10-CM | POA: Diagnosis present

## 2019-07-12 DIAGNOSIS — Z951 Presence of aortocoronary bypass graft: Secondary | ICD-10-CM | POA: Diagnosis not present

## 2019-07-12 DIAGNOSIS — K625 Hemorrhage of anus and rectum: Secondary | ICD-10-CM | POA: Diagnosis present

## 2019-07-12 DIAGNOSIS — I272 Pulmonary hypertension, unspecified: Secondary | ICD-10-CM

## 2019-07-12 DIAGNOSIS — G4733 Obstructive sleep apnea (adult) (pediatric): Secondary | ICD-10-CM | POA: Diagnosis present

## 2019-07-12 DIAGNOSIS — K5731 Diverticulosis of large intestine without perforation or abscess with bleeding: Secondary | ICD-10-CM | POA: Diagnosis present

## 2019-07-12 DIAGNOSIS — D62 Acute posthemorrhagic anemia: Secondary | ICD-10-CM | POA: Diagnosis present

## 2019-07-12 DIAGNOSIS — E785 Hyperlipidemia, unspecified: Secondary | ICD-10-CM | POA: Diagnosis present

## 2019-07-12 DIAGNOSIS — C679 Malignant neoplasm of bladder, unspecified: Secondary | ICD-10-CM | POA: Diagnosis present

## 2019-07-12 DIAGNOSIS — E039 Hypothyroidism, unspecified: Secondary | ICD-10-CM

## 2019-07-12 DIAGNOSIS — Z87891 Personal history of nicotine dependence: Secondary | ICD-10-CM | POA: Diagnosis not present

## 2019-07-12 DIAGNOSIS — I2510 Atherosclerotic heart disease of native coronary artery without angina pectoris: Secondary | ICD-10-CM | POA: Diagnosis present

## 2019-07-12 DIAGNOSIS — J849 Interstitial pulmonary disease, unspecified: Secondary | ICD-10-CM

## 2019-07-12 DIAGNOSIS — Z7982 Long term (current) use of aspirin: Secondary | ICD-10-CM | POA: Diagnosis not present

## 2019-07-12 DIAGNOSIS — I5032 Chronic diastolic (congestive) heart failure: Secondary | ICD-10-CM | POA: Diagnosis present

## 2019-07-12 DIAGNOSIS — Z8719 Personal history of other diseases of the digestive system: Secondary | ICD-10-CM

## 2019-07-12 DIAGNOSIS — I714 Abdominal aortic aneurysm, without rupture: Secondary | ICD-10-CM | POA: Diagnosis present

## 2019-07-12 DIAGNOSIS — Z85828 Personal history of other malignant neoplasm of skin: Secondary | ICD-10-CM | POA: Diagnosis not present

## 2019-07-12 DIAGNOSIS — Z79899 Other long term (current) drug therapy: Secondary | ICD-10-CM | POA: Diagnosis not present

## 2019-07-12 DIAGNOSIS — Z9981 Dependence on supplemental oxygen: Secondary | ICD-10-CM | POA: Diagnosis not present

## 2019-07-12 DIAGNOSIS — K922 Gastrointestinal hemorrhage, unspecified: Secondary | ICD-10-CM | POA: Diagnosis present

## 2019-07-12 DIAGNOSIS — Z79890 Hormone replacement therapy (postmenopausal): Secondary | ICD-10-CM | POA: Diagnosis not present

## 2019-07-12 DIAGNOSIS — Z20828 Contact with and (suspected) exposure to other viral communicable diseases: Secondary | ICD-10-CM | POA: Diagnosis present

## 2019-07-12 DIAGNOSIS — J8410 Pulmonary fibrosis, unspecified: Secondary | ICD-10-CM | POA: Diagnosis present

## 2019-07-12 LAB — COMPREHENSIVE METABOLIC PANEL
ALT: 14 U/L (ref 0–44)
ALT: 15 U/L (ref 0–44)
AST: 24 U/L (ref 15–41)
AST: 29 U/L (ref 15–41)
Albumin: 3.2 g/dL — ABNORMAL LOW (ref 3.5–5.0)
Albumin: 3.8 g/dL (ref 3.5–5.0)
Alkaline Phosphatase: 103 U/L (ref 38–126)
Alkaline Phosphatase: 88 U/L (ref 38–126)
Anion gap: 13 (ref 5–15)
Anion gap: 9 (ref 5–15)
BUN: 21 mg/dL (ref 8–23)
BUN: 23 mg/dL (ref 8–23)
CO2: 25 mmol/L (ref 22–32)
CO2: 26 mmol/L (ref 22–32)
Calcium: 8.4 mg/dL — ABNORMAL LOW (ref 8.9–10.3)
Calcium: 8.8 mg/dL — ABNORMAL LOW (ref 8.9–10.3)
Chloride: 103 mmol/L (ref 98–111)
Chloride: 107 mmol/L (ref 98–111)
Creatinine, Ser: 1.04 mg/dL (ref 0.61–1.24)
Creatinine, Ser: 1.11 mg/dL (ref 0.61–1.24)
GFR calc Af Amer: >60 mL/min (ref >60)
GFR calc Af Amer: >60 mL/min (ref >60)
GFR calc non Af Amer: 57 mL/min — ABNORMAL LOW (ref >60)
GFR calc non Af Amer: >60 mL/min (ref >60)
Glucose, Bld: 101 mg/dL — ABNORMAL HIGH (ref 70–99)
Glucose, Bld: 128 mg/dL — ABNORMAL HIGH (ref 70–99)
Potassium: 3.9 mmol/L (ref 3.5–5.1)
Potassium: 4 mmol/L (ref 3.5–5.1)
Sodium: 141 mmol/L (ref 135–145)
Sodium: 142 mmol/L (ref 135–145)
Total Bilirubin: 0.5 mg/dL (ref 0.3–1.2)
Total Bilirubin: 0.7 mg/dL (ref 0.3–1.2)
Total Protein: 6.6 g/dL (ref 6.5–8.1)
Total Protein: 7.7 g/dL (ref 6.5–8.1)

## 2019-07-12 LAB — CBC WITH DIFFERENTIAL/PLATELET
Abs Immature Granulocytes: 0.05 10*3/uL (ref 0.00–0.07)
Basophils Absolute: 0.1 10*3/uL (ref 0.0–0.1)
Basophils Relative: 1 %
Eosinophils Absolute: 0.2 10*3/uL (ref 0.0–0.5)
Eosinophils Relative: 2 %
HCT: 44.4 % (ref 39.0–52.0)
Hemoglobin: 14.6 g/dL (ref 13.0–17.0)
Immature Granulocytes: 0 %
Lymphocytes Relative: 21 %
Lymphs Abs: 2.5 10*3/uL (ref 0.7–4.0)
MCH: 32.7 pg (ref 26.0–34.0)
MCHC: 32.9 g/dL (ref 30.0–36.0)
MCV: 99.3 fL (ref 80.0–100.0)
Monocytes Absolute: 1 10*3/uL (ref 0.1–1.0)
Monocytes Relative: 8 %
Neutro Abs: 8.3 10*3/uL — ABNORMAL HIGH (ref 1.7–7.7)
Neutrophils Relative %: 68 %
Platelets: 297 10*3/uL (ref 150–400)
RBC: 4.47 MIL/uL (ref 4.22–5.81)
RDW: 13.5 % (ref 11.5–15.5)
WBC: 12.1 10*3/uL — ABNORMAL HIGH (ref 4.0–10.5)
nRBC: 0 % (ref 0.0–0.2)

## 2019-07-12 LAB — CBC
HCT: 39.2 % (ref 39.0–52.0)
Hemoglobin: 12.5 g/dL — ABNORMAL LOW (ref 13.0–17.0)
MCH: 32.4 pg (ref 26.0–34.0)
MCHC: 31.9 g/dL (ref 30.0–36.0)
MCV: 101.6 fL — ABNORMAL HIGH (ref 80.0–100.0)
Platelets: 251 10*3/uL (ref 150–400)
RBC: 3.86 MIL/uL — ABNORMAL LOW (ref 4.22–5.81)
RDW: 13.7 % (ref 11.5–15.5)
WBC: 10.6 10*3/uL — ABNORMAL HIGH (ref 4.0–10.5)
nRBC: 0 % (ref 0.0–0.2)

## 2019-07-12 LAB — HEMOGLOBIN AND HEMATOCRIT, BLOOD
HCT: 33.3 % — ABNORMAL LOW (ref 39.0–52.0)
Hemoglobin: 10.8 g/dL — ABNORMAL LOW (ref 13.0–17.0)

## 2019-07-12 LAB — APTT: aPTT: 36 seconds (ref 24–36)

## 2019-07-12 LAB — PROTIME-INR
INR: 1.1 (ref 0.8–1.2)
Prothrombin Time: 14.2 seconds (ref 11.4–15.2)

## 2019-07-12 LAB — TYPE AND SCREEN
ABO/RH(D): A POS
Antibody Screen: NEGATIVE

## 2019-07-12 LAB — SARS CORONAVIRUS 2 (TAT 6-24 HRS): SARS Coronavirus 2: NEGATIVE

## 2019-07-12 MED ORDER — ALBUTEROL SULFATE HFA 108 (90 BASE) MCG/ACT IN AERS: 2.0000 | INHALATION_SPRAY | Freq: Four times a day (QID) | RESPIRATORY_TRACT | Status: DC | PRN

## 2019-07-12 MED ORDER — FLUTICASONE PROPIONATE 50 MCG/ACT NA SUSP
2.0000 | Freq: Every day | NASAL | Status: DC
  Administered 2019-07-12: 2 via NASAL
  Filled 2019-07-12: qty 16

## 2019-07-12 MED ORDER — INFLUENZA VAC A&B SA ADJ QUAD 0.5 ML IM PRSY
0.5000 mL | PREFILLED_SYRINGE | INTRAMUSCULAR | Status: DC
  Filled 2019-07-12: qty 0.5

## 2019-07-12 MED ORDER — MONTELUKAST SODIUM 10 MG PO TABS
10.0000 mg | ORAL_TABLET | Freq: Every day | ORAL | Status: DC
  Administered 2019-07-12 – 2019-07-13 (×2): 10 mg via ORAL
  Filled 2019-07-12 (×2): qty 1

## 2019-07-12 MED ORDER — ACETAMINOPHEN 325 MG PO TABS: 650.0000 mg | ORAL_TABLET | Freq: Four times a day (QID) | ORAL | Status: DC | PRN

## 2019-07-12 MED ORDER — ORAL CARE MOUTH RINSE
15.0000 mL | Freq: Two times a day (BID) | OROMUCOSAL | Status: DC
  Administered 2019-07-12 – 2019-07-13 (×3): 15 mL via OROMUCOSAL

## 2019-07-12 MED ORDER — LEVOTHYROXINE SODIUM 112 MCG PO TABS
112.0000 ug | ORAL_TABLET | Freq: Every day | ORAL | Status: DC
  Administered 2019-07-12 – 2019-07-14 (×3): 112 ug via ORAL
  Filled 2019-07-12 (×3): qty 1

## 2019-07-12 MED ORDER — FUROSEMIDE 40 MG PO TABS
40.0000 mg | ORAL_TABLET | Freq: Two times a day (BID) | ORAL | Status: DC
  Administered 2019-07-12: 40 mg via ORAL
  Filled 2019-07-12 (×3): qty 1

## 2019-07-12 MED ORDER — SIMVASTATIN 10 MG PO TABS
10.0000 mg | ORAL_TABLET | Freq: Every day | ORAL | Status: DC
  Administered 2019-07-12 – 2019-07-13 (×2): 10 mg via ORAL
  Filled 2019-07-12 (×2): qty 1

## 2019-07-12 MED ORDER — SILDENAFIL CITRATE 20 MG PO TABS
40.0000 mg | ORAL_TABLET | Freq: Three times a day (TID) | ORAL | Status: DC
  Administered 2019-07-12: 40 mg via ORAL
  Filled 2019-07-12 (×8): qty 2

## 2019-07-12 MED ORDER — OXYBUTYNIN CHLORIDE 5 MG PO TABS: 5.0000 mg | ORAL_TABLET | Freq: Three times a day (TID) | ORAL | Status: DC | PRN

## 2019-07-12 MED ORDER — SILDENAFIL CITRATE 20 MG PO TABS
20.0000 mg | ORAL_TABLET | Freq: Once | ORAL | Status: AC
  Administered 2019-07-12: 20 mg via ORAL
  Filled 2019-07-12: qty 1

## 2019-07-12 MED ORDER — SILDENAFIL CITRATE 20 MG PO TABS
20.0000 mg | ORAL_TABLET | Freq: Three times a day (TID) | ORAL | Status: DC
  Administered 2019-07-12: 20 mg via ORAL
  Filled 2019-07-12: qty 1

## 2019-07-12 MED ORDER — LORATADINE 10 MG PO TABS
10.0000 mg | ORAL_TABLET | Freq: Every day | ORAL | Status: DC
  Administered 2019-07-12 – 2019-07-14 (×3): 10 mg via ORAL
  Filled 2019-07-12 (×4): qty 1

## 2019-07-12 MED ORDER — ACETAMINOPHEN 650 MG RE SUPP: 650.0000 mg | Freq: Four times a day (QID) | RECTAL | Status: DC | PRN

## 2019-07-12 NOTE — Consult Note (Signed)
[facility] Consult: 8:38 AM 07/12/2019  LOS: 0 days   Referring Provider: Dr [place]  Primary Care Physician:  Katherina Mires, MD Primary Gastroenterologist:   Dr Benson Norway in 2018   Reason for Consultation: Painless hematochezia.   HPI: Vincent Robinson is a 85 y.o. male.  Hx COPD.  Interstitial lung disease, pulmonary fibrosis on 24/7 nasal cannula oxygen..  4.7 ascending thoracic aortic aneurysm per CT of 12/2017.  Stable mediastinal lymphadenopathy (likely reactive) per CT 12/2017.  OSA.  Pneumothorax.  Hyperlipidemia.  Hypothyroidism.  S/p thoracic aortic stent and axilla/brachial artery bypass. Hematochezia with hemodynamic shock and iron deficiency anemia requiring 4 PRBCs, pressors in 05/2017.  Nuc med bleeding scan at the time negative attributed to diverticulosis.  Diverticular bleed around 2012 when he lived in [place].  Bleeding ulcer in 2016. 05/2017 colonoscopy.  To cecum. Showed diverticulosis in transverse and descending colon.  Blood in the transverse, descending, sigmoid, rectum without clear site of bleeding.  3 days ago he was informed of diagnosis of bladder cancer.  Patient had undergone a CT scan for evaluation of resolved chest pain and this feels some sort of urinary bladder abnormality.  At cystoscopy last week he describes as a small tumor was discovered.  Patient says it was not biopsied.  The urologist wants to discuss the case with cardiology and plan possible repeat cystoscopy with resection.  MD advised patient that this was a slow-growing tumor and was not that worried about it. At baseline patient has 3 or 4 loose stools, small volume, daily and has lots of flatus and bloating/belching attributed to sildenafil.  For the past couple of days he had seen what looked like a reddish color to the stool.   Last night he had episode of true hematochezia without pain, dizziness or other constitutional complaint.  Presented to the ED yesterday evening. He takes 81 ASA but no other platelet disrupting or anticoagulation medications.  Hgb 14.6 >> 12.5.  MCV 101.  Stool FOBT positive WBCs 12.1 >> 10.6. Renal function, electrolytes not deranged. COVID-19 in process  Patient lives independently with his wife.  His wife is 36 years younger than the patient but he says her health is worse than his.  Patient still drives.  Does not drink alcohol.   Past Medical History:  Diagnosis Date   AAA (abdominal aortic aneurysm) ([place])    Asthma    Basal cell carcinoma    COPD (chronic obstructive pulmonary disease) ([place])    Coronary artery disease    Hyperlipidemia    Hypothyroidism    Skin cancer 2017   Sleep apnea     Past Surgical History:  Procedure Laterality Date   bleeding ulcer  2016   BYPASS AXILLA/BRACHIAL ARTERY  1998   colon bleed  2012   COLONOSCOPY N/A 06/07/2017   Procedure: COLONOSCOPY;  Surgeon: Carol Ada, MD;  Location: [facility];  Service: Endoscopy;  Laterality: N/A;   DEBRIDMENT OF DECUBITUS ULCER  2002   RIGHT HEART CATH N/A 12/11/2017   Procedure: RIGHT HEART CATH;  Surgeon:  Bensimhon, Shaune Pascal, MD;  Location: [facility];  Service: Cardiovascular;  Laterality: N/A;   THORACIC AORTA STENT  2009    Prior to Admission medications   Medication Sig Start Date End Date Taking? Authorizing Provider  albuterol (PROVENTIL HFA;VENTOLIN HFA) 108 (90 Base) MCG/ACT inhaler Inhale 1 puff into the lungs 2 (two) times daily. 05/20/17  Yes Mannam, Praveen, MD  aspirin (ASPIRIN 81) 81 MG chewable tablet Chew 1 tablet (81 mg total) by mouth daily. Restart in 1 week 06/14/17  Yes Domenic Polite, MD  fluticasone (VERAMYST) 27.5 MCG/SPRAY nasal spray Place 1 spray into the nose daily.   Yes [provider]  Fluticasone-Salmeterol 113-14 MCG/ACT AEPB INHALE 1  PUFF TWICE A DAY 03/12/18  Yes Mannam, Praveen, MD  furosemide (LASIX) 40 MG tablet Take 1 tablet (40 mg total) by mouth daily. 06/01/19  Yes Lorretta Harp, MD  levothyroxine (SYNTHROID, LEVOTHROID) 112 MCG tablet Take 112 mcg by mouth daily before breakfast.   Yes [provider]  loratadine (CLARITIN) 10 MG tablet Take 10 mg by mouth daily.   Yes [provider]  macitentan (OPSUMIT) 10 MG tablet Take 1 tablet (10 mg total) by mouth daily. 05/11/19  Yes Bensimhon, Shaune Pascal, MD  montelukast (SINGULAIR) 10 MG tablet TAKE ONE TABLET BY MOUTH AT BEDTIME 03/02/19  Yes Mannam, Praveen, MD  sildenafil (REVATIO) 20 MG tablet TAKE TWO TABLETS BY MOUTH THREE TIMES A DAY 06/01/19  Yes Bensimhon, Shaune Pascal, MD  simvastatin (ZOCOR) 10 MG tablet Take 10 mg by mouth at bedtime. 03/22/17  Yes [provider]  amoxicillin (AMOXIL) 500 MG tablet Take 4 tablets (2,000 mg total) by mouth as needed (prior to dental work). 03/06/18   Bensimhon, Shaune Pascal, MD  oxybutynin (DITROPAN) 5 MG tablet Take 5 mg by mouth every 8 (eight) hours as needed for bladder spasms.    [provider]    Scheduled Meds:  fluticasone  2 spray Each Nare Daily   furosemide  40 mg Oral BID   [START ON 07/13/2019] influenza vaccine adjuvanted  0.5 mL Intramuscular Tomorrow-1000   levothyroxine  112 mcg Oral QAC breakfast   loratadine  10 mg Oral Daily   mouth rinse  15 mL Mouth Rinse BID   montelukast  10 mg Oral QHS   sildenafil  20 mg Oral TID   simvastatin  10 mg Oral QHS   Infusions:  sodium chloride 75 mL/hr at 07/12/19 0112   PRN Meds: acetaminophen **OR** acetaminophen, albuterol, oxybutynin   Allergies as of 07/11/2019   (No Known Allergies)    Family History  Problem Relation Age of Onset   Emphysema Mother    Stroke Father    Movement disorder Sister    Aneurysm Brother        Thoracic aorta   Macular degeneration Brother     Social History   Socioeconomic  History   Marital status: Married    Spouse name: Not on file   Number of children: Not on file   Years of education: Not on file   Highest education level: Not on file  Occupational History   Not on file  Social Needs   Financial resource strain: Not on file   Food insecurity    Worry: Not on file    Inability: Not on file   Transportation needs    Medical: Not on file    Non-medical: Not on file  Tobacco Use   Smoking status: Former  Smoker    Packs/day: 0.50    Quit date: 08/06/1961    Years since quitting: 57.9   Smokeless tobacco: Never Used  Substance and Sexual Activity   Alcohol use: Not Currently   Drug use: Not on file   Sexual activity: Not on file  Lifestyle   Physical activity    Days per week: Not on file    Minutes per session: Not on file   Stress: Not on file  Relationships   Social connections    Talks on phone: Not on file    Gets together: Not on file    Attends religious service: Not on file    Active member of club or organization: Not on file    Attends meetings of clubs or organizations: Not on file    Relationship status: Not on file   Intimate partner violence    Fear of current or ex partner: Not on file    Emotionally abused: Not on file    Physically abused: Not on file    Forced sexual activity: Not on file  Other Topics Concern   Not on file  Social History Narrative   Not on file    REVIEW OF SYSTEMS: Constitutional: Patient does not have fatigue or weakness but he cannot walk very long without getting short of breath. ENT:  No nose bleeds Pulm: Dyspnea with moderate exertion.  No new cough.  No change in his respiratory status. CV:  No palpitations, no LE edema.  No angina. GU:  No hematuria.  +  frequency. GI: No dysphagia.  No nausea, vomiting.  No abdominal pain. Heme: Patient takes his 81 mg aspirin about 5 days a week.  When he starts to see purpura on his arms, he skips a dose of aspirin that  day. Transfusions: Per HPI. Neuro:  No headaches, no peripheral tingling or numbness.  No seizures, no syncope. Derm:  No itching, no rash or sores.  Endocrine:  No sweats or chills.  No polyuria or dysuria Immunization: Reviewed. Travel:  None    PHYSICAL EXAM: Vital signs in last 24 hours: Vitals:   07/12/19 0223 07/12/19 0548  BP: (!) 143/75 (!) 106/55  Pulse: 71 60  Resp: 18 20  Temp: 97.6 F (36.4 C) 97.7 F (36.5 C)  SpO2: 90% 91%   Wt Readings from Last 3 Encounters:  07/12/19 81.2 kg  09/11/18 84 kg  07/09/18 84.6 kg    General: Very pleasant, elderly man.  Does not look particularly frail.  Does not look ill. Head: No facial asymmetry or swelling.  No signs of head trauma. Eyes: No scleral icterus, no conjunctival pallor.  EOMI. Ears: Not hard of hearing Nose: No congestion or discharge. Mouth: Dentures in place, not removed for exam.  Oral mucosa pink, moist, clear.  Tongue midline. Neck: No JVD, no masses, no thyromegaly. Lungs: Dry crackles in the bases and generally reduced breath sounds throughout.  No cough, no dyspnea.  Nasal cannula oxygen in place. Heart: RRR.  Slight [id number] harsh systolic murmur.  S1, S2 present. Abdomen: Soft.  Not tender.  Not distended.  Not obese.  No HSM, masses, bruits, hernias..   Rectal: Deferred Musc/Skeltl: No joint redness, swelling or significant deformity.  Arthritic changes in the fingers.  For his age he has fairly well-preserved muscle tone in his legs. Extremities: No CCE. Neurologic: Alert.  Oriented x3.  Moves all 4 limbs.  Strength not tested.  No tremors. Skin: Somewhat dry.  No  suspicious lesions.  No significant purpura/bruising. Tattoos: None observed Nodes: No cervical adenopathy Psych: Pleasant, calm, cooperative.  Intake/Output from previous day: 09/19 0701 - 09/20 0700 In: 202 [I.V.:202] Out: 1 [Stool:1] Intake/Output this shift: No intake/output data recorded.  LAB RESULTS: Recent Labs     07/11/19 2349 07/12/19 0346  WBC 12.1* 10.6*  HGB 14.6 12.5*  HCT 44.4 39.2  PLT 297 251   BMET Lab Results  Component Value Date   NA 142 07/12/2019   NA 141 07/11/2019   NA 139 12/11/2017   K 4.0 07/12/2019   K 3.9 07/11/2019   K 4.1 12/11/2017   CL 107 07/12/2019   CL 103 07/11/2019   CL 101 12/11/2017   CO2 26 07/12/2019   CO2 25 07/11/2019   CO2 25 12/11/2017   GLUCOSE 101 (H) 07/12/2019   GLUCOSE 128 (H) 07/11/2019   GLUCOSE 107 (H) 12/11/2017   BUN 23 07/12/2019   BUN 21 07/11/2019   BUN 22 (H) 12/11/2017   CREATININE 1.04 07/12/2019   CREATININE 1.11 07/11/2019   CREATININE 1.16 12/11/2017   CALCIUM 8.4 (L) 07/12/2019   CALCIUM 8.8 (L) 07/11/2019   CALCIUM 9.2 12/11/2017   LFT Recent Labs    07/11/19 2349 07/12/19 0346  PROT 7.7 6.6  ALBUMIN 3.8 3.2*  AST 29 24  ALT 14 15  ALKPHOS 103 88  BILITOT 0.7 0.5   PT/INR Lab Results  Component Value Date   INR 1.1 07/11/2019   INR 1.05 12/11/2017   INR 1.13 06/12/2017   Hepatitis Panel No results for input(s): HEPBSAG, HCVAB, HEPAIGM, HEPBIGM in the last 72 hours. C-Diff No components found for: CDIFF Lipase  No results found for: LIPASE   RADIOLOGY STUDIES: No results found.  IMPRESSION:   *   Hematochezia.  Presumed recurrent diverticular bleed.  *     Anemia, initially normocytic but on recheck macrocytic.  *    Bladder cancer.  Diagnosed last week.  Apparently (pt report) small tumor seen at  cystoscopy last week; pending further cystoscopy and resection after cardiac clearance.   *     Pulmonary hypertension, interstitial lung disease, COPD.  On oxygen, sildenafil.    PLAN:     *    Conservative, supportive care.  Recheck CBC in the morning.  Allow full liquids.  If he develops recurrent active, bright red bleeding, pursue nuclear medicine RBC study.   Azucena Freed  07/12/2019, 8:38 AM Phone [phone]

## 2019-07-12 NOTE — H&P (Addendum)
TRH H&P    Patient Demographics:    Vincent Robinson, is a 85 y.o. male  MRN: [id number]  DOB - 1923-08-13  Admit Date - 07/11/2019  Referring MD/NP/PA:  Erasmo Downer Ward   Outpatient Primary MD for the patient is Briscoe, Jannifer Rodney, MD Carol Ada -GI  Urology -> ???  Patient coming from:  home  Chief complaint- rectal bleeding   HPI:    Vincent Robinson  is a 85 y.o. male, w Asthma/Copd  (on home o2), ILD, moderate Pulmonary Hypertension (R heart cath 12/11/2017), OSA,  Hypothyoidism, CAD s/p CABG, CHF (EF 60-65%), mild Aortic stenosis, mild MR (cardiac echo 09/11/2018), AAA 4.8cm, , recent diagnosis of bladder cancer 3 days ago,  h/o diverticular bleeding 06/05/2017 apparently presents with c/o rectal bleeding starting 2 days ago. worse last nite and therefore presented to ED.   Pt denies fever, chills, n/v, abd pain, diarrhea.  Pt notes has 4 bm loose stool per day due to being on sildenafil for his pulmonary hypertension,  No change in bm.  Pt is taking aspirin but no other blood thinners.  He notes that the only takes the aspirin typically every other day.  Pt had a flex sigmoidoscopy in 2018, but colonoscopy was a while ago.  Pt presented due to concerns about rectal bleeding which caused hypotension in 2018.   In ED,  T 98  p 85  R 26, Bp 124/61 pox 93% on o2 2L [place]  FOBT positive, rectal per ED melana  Wbc 12.1, hgb 14.6, Plt 297 Na 141, K 3.9, Bun 21, Creatinine 1.11 Ast 29, Alt 14  INR 1.1  Type and screen done.   Pt will be admitted for rectal bleeding. Probably diverticular.     Review of systems:    In addition to the HPI above,  No Fever-chills, No Headache, No changes with Vision or hearing, No problems swallowing food or Liquids, No Chest pain, slight dry cough No Abdominal pain, No Nausea or Vomiting, No Blood in urine No dysuria, No new skin rashes or bruises, No new joints pains-aches,   No new weakness, tingling, numbness in any extremity, No recent weight gain or loss, No polyuria, polydypsia or polyphagia, No significant Mental Stressors.  All other systems reviewed and are negative.    Past History of the following :    Past Medical History:  Diagnosis Date  . AAA (abdominal aortic aneurysm) ([place])   . Asthma   . Basal cell carcinoma   . COPD (chronic obstructive pulmonary disease) ([place])   . Coronary artery disease   . Hyperlipidemia   . Hypothyroidism   . Skin cancer 2017  . Sleep apnea       Past Surgical History:  Procedure Laterality Date  . bleeding ulcer  2016  . BYPASS AXILLA/BRACHIAL ARTERY  1998  . colon bleed  2012  . COLONOSCOPY N/A 06/07/2017   Procedure: COLONOSCOPY;  Surgeon: Carol Ada, MD;  Location: [facility];  Service: Endoscopy;  Laterality: N/A;  . DEBRIDMENT OF DECUBITUS ULCER  2002  .  RIGHT HEART CATH N/A 12/11/2017   Procedure: RIGHT HEART CATH;  Surgeon: Jolaine Artist, MD;  Location: [facility];  Service: Cardiovascular;  Laterality: N/A;  . THORACIC AORTA STENT  2009      Social History:      Social History   Tobacco Use  . Smoking status: Former Smoker    Packs/day: 0.50    Quit date: 08/06/1961    Years since quitting: 57.9  . Smokeless tobacco: Never Used  Substance Use Topics  . Alcohol use: Not Currently       Family History :     Family History  Problem Relation Age of Onset  . Emphysema Mother   . Stroke Father   . Movement disorder Sister   . Aneurysm Brother        Thoracic aorta  . Macular degeneration Brother        Home Medications:   Prior to Admission medications   Medication Sig Start Date End Date Taking? Authorizing Provider  albuterol (PROVENTIL HFA;VENTOLIN HFA) 108 (90 Base) MCG/ACT inhaler Inhale 1 puff into the lungs 2 (two) times daily. 05/20/17   Mannam, Hart Robinsons, MD  amoxicillin (AMOXIL) 500 MG tablet Take 4 tablets (2,000 mg total) by mouth as needed (prior  to dental work). 03/06/18   Bensimhon, Shaune Pascal, MD  aspirin (ASPIRIN 81) 81 MG chewable tablet Chew 1 tablet (81 mg total) by mouth daily. Restart in 1 week 06/14/17   Domenic Polite, MD  fluticasone (VERAMYST) 27.5 MCG/SPRAY nasal spray Place 1 spray into the nose daily.    [provider]  Fluticasone-Salmeterol 113-14 MCG/ACT AEPB INHALE 1 PUFF TWICE A DAY 03/12/18   Mannam, Praveen, MD  furosemide (LASIX) 40 MG tablet Take 1 tablet (40 mg total) by mouth daily. 06/01/19   Lorretta Harp, MD  levothyroxine (SYNTHROID, LEVOTHROID) 112 MCG tablet Take 112 mcg by mouth daily before breakfast.    [provider]  loratadine (CLARITIN) 10 MG tablet Take 10 mg by mouth daily.    [provider]  macitentan (OPSUMIT) 10 MG tablet Take 1 tablet (10 mg total) by mouth daily. 05/11/19   Bensimhon, Shaune Pascal, MD  montelukast (SINGULAIR) 10 MG tablet TAKE ONE TABLET BY MOUTH AT BEDTIME 03/02/19   Mannam, Praveen, MD  oxybutynin (DITROPAN) 5 MG tablet Take 5 mg by mouth every 8 (eight) hours as needed for bladder spasms.    [provider]  sildenafil (REVATIO) 20 MG tablet TAKE TWO TABLETS BY MOUTH THREE TIMES A DAY 06/01/19   Bensimhon, Shaune Pascal, MD  simvastatin (ZOCOR) 10 MG tablet Take 10 mg by mouth at bedtime. 03/22/17   [provider]     Allergies:    No Known Allergies   Physical Exam:   Vitals  Blood pressure (!) 110/59, pulse 72, temperature 98 F (36.7 C), temperature source Oral, resp. rate (!) 25, SpO2 92 %.  1.  General: axoxo3  2. Psychiatric: euthymic  3. Neurologic: nonfocal  4. HEENMT:  Anicteric, pink conjunctiva, pupils 1.57mm symmetric, direct, consensual, intact Neck: no jvd  5. Respiratory : + bibasilar crackles, no wheezing  6. Cardiovascular : rrr s1, s2, 2/6 sem rusb  7. Gastrointestinal:  ABd: soft, nt, nd, +bs  8. Skin:  Ext: no c/c/e,  No rash  9.Musculoskeletal:  Good ROM,      Data Review:    CBC  Recent Labs  Lab 07/11/19 2349  WBC 12.1*  HGB 14.6  HCT  44.4  PLT 297  MCV 99.3  MCH 32.7  MCHC 32.9  RDW 13.5  LYMPHSABS 2.5  MONOABS 1.0  EOSABS 0.2  BASOSABS 0.1   ------------------------------------------------------------------------------------------------------------------  Results for orders placed or performed during the hospital encounter of 07/11/19 (from the past 48 hour(s))  POC occult blood, ED     Status: Abnormal   Collection Time: 07/11/19 11:41 PM  Result Value Ref Range   Fecal Occult Bld POSITIVE (A) NEGATIVE  Comprehensive metabolic panel     Status: Abnormal   Collection Time: 07/11/19 11:49 PM  Result Value Ref Range   Sodium 141 135 - 145 mmol/L   Potassium 3.9 3.5 - 5.1 mmol/L   Chloride 103 98 - 111 mmol/L   CO2 25 22 - 32 mmol/L   Glucose, Bld 128 (H) 70 - 99 mg/dL   BUN 21 8 - 23 mg/dL   Creatinine, Ser 1.11 0.61 - 1.24 mg/dL   Calcium 8.8 (L) 8.9 - 10.3 mg/dL   Total Protein 7.7 6.5 - 8.1 g/dL   Albumin 3.8 3.5 - 5.0 g/dL   AST 29 15 - 41 U/L   ALT 14 0 - 44 U/L   Alkaline Phosphatase 103 38 - 126 U/L   Total Bilirubin 0.7 0.3 - 1.2 mg/dL   GFR calc non Af Amer 57 (L) >60 mL/min   GFR calc Af Amer >60 >60 mL/min   Anion gap 13 5 - 15    Comment: Performed at [facility], [place] [address] [place], [place] [address]  CBC WITH DIFFERENTIAL     Status: Abnormal   Collection Time: 07/11/19 11:49 PM  Result Value Ref Range   WBC 12.1 (H) 4.0 - 10.5 K/uL   RBC 4.47 4.22 - 5.81 MIL/uL   Hemoglobin 14.6 13.0 - 17.0 g/dL   HCT 44.4 39.0 - 52.0 %   MCV 99.3 80.0 - 100.0 fL   MCH 32.7 26.0 - 34.0 pg   MCHC 32.9 30.0 - 36.0 g/dL   RDW 13.5 11.5 - 15.5 %   Platelets 297 150 - 400 K/uL   nRBC 0.0 0.0 - 0.2 %   Neutrophils Relative % 68 %   Neutro Abs 8.3 (H) 1.7 - 7.7 K/uL   Lymphocytes Relative 21 %   Lymphs Abs 2.5 0.7 - 4.0 K/uL   Monocytes Relative 8 %   Monocytes Absolute 1.0 0.1 - 1.0 K/uL   Eosinophils Relative  2 %   Eosinophils Absolute 0.2 0.0 - 0.5 K/uL   Basophils Relative 1 %   Basophils Absolute 0.1 0.0 - 0.1 K/uL   Immature Granulocytes 0 %   Abs Immature Granulocytes 0.05 0.00 - 0.07 K/uL    Comment: Performed at [facility], [place] [address] [place], [place] [address]  Protime-INR     Status: None   Collection Time: 07/11/19 11:49 PM  Result Value Ref Range   Prothrombin Time 14.2 11.4 - 15.2 seconds   INR 1.1 0.8 - 1.2    Comment: (NOTE) INR goal varies based on device and disease states. Performed at [facility], [place] [address] [place], [place] [address]   Type and screen [place]     Status: None (Preliminary result)   Collection Time: 07/11/19 11:49 PM  Result Value Ref Range   ABO/RH(D) A POS    Antibody Screen PENDING    Sample Expiration      07/14/2019,2359 Performed at [facility],  [place] [address], [place], [place] [address]   APTT     Status: None   Collection Time: 07/11/19 11:49 PM  Result Value Ref Range   aPTT 36 24 - 36 seconds    Comment: Performed at [facility], [place] [address], [place], [place] [address]    Chemistries  Recent Labs  Lab 07/11/19 2349  NA 141  K 3.9  CL 103  CO2 25  GLUCOSE 128*  BUN 21  CREATININE 1.11  CALCIUM 8.8*  AST 29  ALT 14  ALKPHOS 103  BILITOT 0.7   ------------------------------------------------------------------------------------------------------------------  ------------------------------------------------------------------------------------------------------------------ GFR: CrCl cannot be calculated (Unknown ideal weight.). Liver Function Tests: Recent Labs  Lab 07/11/19 2349  AST 29  ALT 14  ALKPHOS 103  BILITOT 0.7  PROT 7.7  ALBUMIN 3.8   No results for input(s): LIPASE, AMYLASE in the last 168 hours. No results for input(s): AMMONIA in the last 168 hours. Coagulation Profile: Recent Labs   Lab 07/11/19 2349  INR 1.1   Cardiac Enzymes: No results for input(s): CKTOTAL, CKMB, CKMBINDEX, TROPONINI in the last 168 hours. BNP (last 3 results) No results for input(s): PROBNP in the last 8760 hours. HbA1C: No results for input(s): HGBA1C in the last 72 hours. CBG: No results for input(s): GLUCAP in the last 168 hours. Lipid Profile: No results for input(s): CHOL, HDL, LDLCALC, TRIG, CHOLHDL, LDLDIRECT in the last 72 hours. Thyroid Function Tests: No results for input(s): TSH, T4TOTAL, FREET4, T3FREE, THYROIDAB in the last 72 hours. Anemia Panel: No results for input(s): VITAMINB12, FOLATE, FERRITIN, TIBC, IRON, RETICCTPCT in the last 72 hours.  --------------------------------------------------------------------------------------------------------------- Urine analysis: No results found for: COLORURINE, APPEARANCEUR, LABSPEC, PHURINE, GLUCOSEU, HGBUR, BILIRUBINUR, KETONESUR, PROTEINUR, UROBILINOGEN, NITRITE, LEUKOCYTESUR    Imaging Results:    No results found.     Assessment & Plan:    Principal Problem:   Rectal bleeding Active Problems:   COPD (chronic obstructive pulmonary disease) (HCC)   ILD (interstitial lung disease) (HCC)   Sleep apnea   Hypothyroidism   Coronary artery disease   AAA (abdominal aortic aneurysm) without rupture ([place])   Pulmonary hypertension, unspecified (H[facility])   Rectal bleeding probably diverticular NPO except for medications NS at 110mL-> 52mL per hour,  Check cbc in am Please consult GI in am  Copd on home o2 Moderate pulmonary hypertension ILD Cont o2 [place] Pt not taking Opsumit due to Cost Cont Sildenafil 20mg  po tid Cont Singulair 10mg  po qday Cont Albuterol HFA 2puff q6h prn  Cont Advair 1puff bid  OSA Cont Cpap  CAD s/p CABG, CHF (EF 60-65%) Hold aspirin due to GI bleeding Cont Simvastatin 10mg  po qhs Cont Lasix 40mg  po bid (pt states Benshimon wanted him to take twice a day)  Hypothyroidism Cont Levothyroxine  112 micrograms po qday   DVT Prophylaxis-    SCDs  AM Labs Ordered, also please review Full Orders  Family Communication: Admission, patients condition and plan of care including tests being ordered have been discussed with the patient  who indicate understanding and agree with the plan and Code Status.  Code Status:  FULL CODE per patient,  Notified wife that patient will be admitted to [facility]  Admission status: Observation/ : Based on patients clinical presentation and evaluation of above clinical data, I have made determination that patient meetsobservation criteria at this time.    Time spent in minutes : 70 minutes   Jani Gravel M.D on 07/12/2019 at 1:55 AM

## 2019-07-12 NOTE — Progress Notes (Signed)
Pt had 3 medium amounts of bloody stool occurences. Pt denies feeling dizzy, short of breath, or having any other complaints all shift.

## 2019-07-12 NOTE — ED Notes (Signed)
Attempted to call report to receiving RN. Receiving RN is providing patient care. Will call back.

## 2019-07-12 NOTE — Plan of Care (Signed)
Continue current POC 

## 2019-07-12 NOTE — Progress Notes (Signed)
Pt had medium amount of dark red bloody stool. Pt denies feeling dizzy, short of breath, or any other complaints.  Will continue to monitor.

## 2019-07-12 NOTE — Progress Notes (Signed)
PROGRESS NOTE    Vincent Robinson  [id number] DOB: [birth year] DOA: 07/11/2019 PCP: Katherina Mires, MD   Brief Narrative:   Vincent Robinson  is a 85 y.o. male, w Asthma/Copd  (on home o2), ILD, moderate Pulmonary Hypertension (R heart cath 12/11/2017), OSA,  Hypothyoidism, CAD s/p CABG, CHF (EF 60-65%), mild Aortic stenosis, mild MR (cardiac echo 09/11/2018), AAA 4.8cm, , recent diagnosis of bladder cancer 3 days ago,  h/o diverticular bleeding 06/05/2017 apparently presents with c/o rectal bleeding starting 2 days ago. worse last nite and therefore presented to ED.   Pt denies fever, chills, n/v, abd pain, diarrhea.  Pt notes has 4 bm loose stool per day due to being on sildenafil for his pulmonary hypertension,  No change in bm.  Pt is taking aspirin but no other blood thinners.  He notes that the only takes the aspirin typically every other day.  Pt had a flex sigmoidoscopy in 2018, but colonoscopy was a while ago.  Pt presented due to concerns about rectal bleeding which caused hypotension in 2018.   In ED, T 98  p 85  R 26, Bp 124/61 pox 93% on O2 2L [place]. FOBT positive, rectal per ED melana. Wbc 12.1, hgb 14.6, Plt 297. Na 141, K 3.9, Bun 21, Creatinine 1.11. Ast 29, Alt 14. INR 1.1. Type and screen done.   Pt will be admitted for rectal bleeding. Probably diverticular.    Assessment & Plan:   Principal Problem:   Rectal bleeding Active Problems:   COPD (chronic obstructive pulmonary disease) (HCC)   ILD (interstitial lung disease) (HCC)   Sleep apnea   Hypothyroidism   Coronary artery disease   AAA (abdominal aortic aneurysm) without rupture (HCC)   Pulmonary hypertension, unspecified (HCC)  Acute blood loss anemia Acute lower GI bleed, suspect diverticular patient presenting with 2 episodes of bright red blood per rectum.  History of diverticular bleed in 2018 in which he underwent  Flexible sigmoidoscopy without concerning findings.   Hemoglobin on admission 14.6.  --[place] consulted, appreciate assistance --Hgb 14.6-->12.5 --Full liquid diet --if significant recurrent active bright red bleeding, rec NM RBC bleeding scan per GI --Will repeat Hgb this evening --repeat CBC in AM   COPD, on home O2,  compensated  interstitial lung disease  pulmonary hypertension, mild  currently oxygenating well on his home regimen 2 L per nasal cannula. --Continue sildenafil 40 mg p.o. TID --continue Singulair 10 mg p.o. daily --continue Advair --albuterol p.r.n.  CAD s/p CABG Chronic diastolic congestive heart failure, compensated --holding aspirin 2/ GI bleed as above --continue lasix 40mg  PO BID --continue statin  OSA: continue nocturnal CPAP  Hypothyroidism: Continue levothyroxine 112 mcg p.o. daily   DVT prophylaxis:  SCDs Code Status:  Full code Family Communication:  none Disposition Plan:  Continue inpatient, close monitoring of hemoglobin, further dependent on clinical course   Consultants:   [facility]  Procedures:  none  Antimicrobials:    none   Subjective: Patient seen examined at bedside, resting comfortably.  State head and further recurrence of bright red blood per rectum early this morning.  States this was similar to an episode back in 2018 which they could not identify the actual source but it was thought to be diverticular.  Patient describes a lower abdominal "uncomfortable feeling".  No other complaints or concerns at this time.  Denies headache, no dizziness, no fever/chills /night sweats, no nausea /vomiting /diarrhea, no cough / congestion, no chest pain, palpitations, no shortness  of breath, no issues with bladder function, and no paresthesias.  No acute events overnight per nursing staff.  Objective: Vitals:   07/12/19 0109 07/12/19 0223 07/12/19 0500 07/12/19 0548  BP: (!) 110/59 (!) 143/75  (!) 106/55  Pulse: 72 71  60  Resp: (!) 25 18  20   Temp:  97.6 F (36.4 C)  97.7 F (36.5 C)   TempSrc:  Oral  Oral  SpO2: 92% 90%  91%  Weight:   81.2 kg     Intake/Output Summary (Last 24 hours) at 07/12/2019 0657 Last data filed at 07/12/2019 [id number] Gross per 24 hour  Intake 202.02 ml  Output -  Net 202.02 ml   Filed Weights   07/12/19 0500  Weight: 81.2 kg    Examination:  General exam: Appears calm and comfortable  Respiratory system: Clear to auscultation. Respiratory effort normal. Cardiovascular system: S1 & S2 heard, RRR. No JVD, murmurs, rubs, gallops or clicks. No pedal edema. Gastrointestinal system: Abdomen is nondistended, soft and nontender. No organomegaly or masses felt. Normal bowel sounds heard. Central nervous system: Alert and oriented. No focal neurological deficits. Extremities: Symmetric 5 x 5 power. Skin: No rashes, lesions or ulcers Psychiatry: Judgement and insight appear normal. Mood & affect appropriate.     Data Reviewed: I have personally reviewed following labs and imaging studies  CBC: Recent Labs  Lab 07/11/19 2349 07/12/19 0346  WBC 12.1* 10.6*  NEUTROABS 8.3*  --   HGB 14.6 12.5*  HCT 44.4 39.2  MCV 99.3 101.6*  PLT 297 [id number]   Basic Metabolic Panel: Recent Labs  Lab 07/11/19 2349 07/12/19 0346  NA 141 142  K 3.9 4.0  CL 103 107  CO2 25 26  GLUCOSE 128* 101*  BUN 21 23  CREATININE 1.11 1.04  CALCIUM 8.8* 8.4*   GFR: CrCl cannot be calculated (Unknown ideal weight.). Liver Function Tests: Recent Labs  Lab 07/11/19 2349 07/12/19 0346  AST 29 24  ALT 14 15  ALKPHOS 103 88  BILITOT 0.7 0.5  PROT 7.7 6.6  ALBUMIN 3.8 3.2*   No results for input(s): LIPASE, AMYLASE in the last 168 hours. No results for input(s): AMMONIA in the last 168 hours. Coagulation Profile: Recent Labs  Lab 07/11/19 2349  INR 1.1   Cardiac Enzymes: No results for input(s): CKTOTAL, CKMB, CKMBINDEX, TROPONINI in the last 168 hours. BNP (last 3 results) No results for input(s): PROBNP in the last 8760 hours. HbA1C: No results for  input(s): HGBA1C in the last 72 hours. CBG: No results for input(s): GLUCAP in the last 168 hours. Lipid Profile: No results for input(s): CHOL, HDL, LDLCALC, TRIG, CHOLHDL, LDLDIRECT in the last 72 hours. Thyroid Function Tests: No results for input(s): TSH, T4TOTAL, FREET4, T3FREE, THYROIDAB in the last 72 hours. Anemia Panel: No results for input(s): VITAMINB12, FOLATE, FERRITIN, TIBC, IRON, RETICCTPCT in the last 72 hours. Sepsis Labs: No results for input(s): PROCALCITON, LATICACIDVEN in the last 168 hours.  No results found for this or any previous visit (from the past 240 hour(s)).       Radiology Studies: No results found.      Scheduled Meds: . fluticasone  2 spray Each Nare Daily  . furosemide  40 mg Oral BID  . [START ON 07/13/2019] influenza vaccine adjuvanted  0.5 mL Intramuscular Tomorrow-1000  . levothyroxine  112 mcg Oral QAC breakfast  . loratadine  10 mg Oral Daily  . mouth rinse  15 mL Mouth Rinse BID  . montelukast  10 mg Oral QHS  . sildenafil  20 mg Oral TID  . simvastatin  10 mg Oral QHS   Continuous Infusions: . sodium chloride 75 mL/hr at 07/12/19 0112     LOS: 0 days    Time spent: 36 minutes spent on chart review, discussion with nursing staff, consultants, updating family and interview/physical exam; more than 50% of that time was spent in counseling and/or coordination of care.     Eric J [place] DO [facility] Pager [phone]  If 7PM-7AM, please contact night-coverage [email or web address] Password [facility] 07/12/2019, 6:57 AM

## 2019-07-13 LAB — BASIC METABOLIC PANEL
Anion gap: 8 (ref 5–15)
BUN: 27 mg/dL — ABNORMAL HIGH (ref 8–23)
CO2: 24 mmol/L (ref 22–32)
Calcium: 8.3 mg/dL — ABNORMAL LOW (ref 8.9–10.3)
Chloride: 106 mmol/L (ref 98–111)
Creatinine, Ser: 0.92 mg/dL (ref 0.61–1.24)
GFR calc Af Amer: >60 mL/min (ref >60)
GFR calc non Af Amer: >60 mL/min (ref >60)
Glucose, Bld: 107 mg/dL — ABNORMAL HIGH (ref 70–99)
Potassium: 4 mmol/L (ref 3.5–5.1)
Sodium: 138 mmol/L (ref 135–145)

## 2019-07-13 LAB — CBC
HCT: 32.5 % — ABNORMAL LOW (ref 39.0–52.0)
Hemoglobin: 10.6 g/dL — ABNORMAL LOW (ref 13.0–17.0)
MCH: 32.6 pg (ref 26.0–34.0)
MCHC: 32.6 g/dL (ref 30.0–36.0)
MCV: 100 fL (ref 80.0–100.0)
Platelets: 243 10*3/uL (ref 150–400)
RBC: 3.25 MIL/uL — ABNORMAL LOW (ref 4.22–5.81)
RDW: 13.5 % (ref 11.5–15.5)
WBC: 11.6 10*3/uL — ABNORMAL HIGH (ref 4.0–10.5)
nRBC: 0 % (ref 0.0–0.2)

## 2019-07-13 LAB — HEMOGLOBIN AND HEMATOCRIT, BLOOD
HCT: 35.6 % — ABNORMAL LOW (ref 39.0–52.0)
Hemoglobin: 11.5 g/dL — ABNORMAL LOW (ref 13.0–17.0)

## 2019-07-13 MED ORDER — SODIUM CHLORIDE 0.9 % IV SOLN
INTRAVENOUS | Status: DC | PRN
  Administered 2019-07-13: 250 mL via INTRAVENOUS

## 2019-07-13 MED ORDER — ADULT MULTIVITAMIN W/MINERALS CH
1.0000 | ORAL_TABLET | Freq: Every day | ORAL | Status: DC
  Administered 2019-07-13 – 2019-07-14 (×2): 1 via ORAL
  Filled 2019-07-13 (×2): qty 1

## 2019-07-13 MED ORDER — SODIUM CHLORIDE 0.9 % IV SOLN
510.0000 mg | Freq: Once | INTRAVENOUS | Status: AC
  Administered 2019-07-13: 510 mg via INTRAVENOUS
  Filled 2019-07-13: qty 17

## 2019-07-13 NOTE — Progress Notes (Signed)
Pt's BP was 101/54 HR 64, held Lasix/sildenafil again. Pt asymptomatic. Dr [place] made aware.
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
## 2019-07-13 NOTE — Progress Notes (Signed)
Initial Nutrition Assessment  DOCUMENTATION CODES:   Obesity unspecified  INTERVENTION:  - will order [employer] Breakfast with 2% milk for breakfast. - will order Magic Cup with dinner meals, each supplement provides 290 kcal and 9 grams of protein. - will order daily multivitamin with minerals. - continue to encourage PO intakes.    NUTRITION DIAGNOSIS:   Increased nutrient needs related to cancer and cancer related treatments, acute illness as evidenced by estimated needs.  GOAL:   Patient will meet greater than or equal to 90% of their needs  MONITOR:   PO intake, Supplement acceptance, Labs, Weight trends  REASON FOR ASSESSMENT:   Malnutrition Screening Tool  ASSESSMENT:   85 y.o. male with medical history of asthma, COPD, ILD, moderate pulmonary HTN, OSA, hypothyroidism, CAD s/p CABG, CHF, mild aortic stenosis, AAA, diverticular bleeding, and recent diagnosis of bladder cancer (07/09/19). Patient presented to the ED on 9/20 due to rectal bleeding which started on 9/18. He reported having 4 loose BMs/day due to medication he is taking for pulmonary HTN. He was admitted for rectal bleeding which was thought to be diverticular.  Per flow sheet, patient consumed 50% of breakfast and 25% of lunch yesterday. He reports good appetite and intakes/at baseline until the day that rectal bleeding began. Since that time he has had increased BMs, no abdominal pain or N/V, and has been less interested in eating than usual. Will order supplements as outlined above to help.   He is unsure if his weight changed at all PTA. Per chart review, current weight is 179 lb which is consistent with weight on 06/17/19 at [facility]. Prior to that, last recorded weight was on 09/11/18 when patient weighed 185 lb. This indicates 6 lb weight loss (3.2% body weight) in the past 10 months; not significant for time frame.   Per notes: - acute blood loss anemia--acute LGIB thought to be diverticular  -  COPD--compensated, interstitial lung disease - GI following--bleeding has slowed or stopped   Labs reviewed; BUN: 27 mg/dl, Ca: 8.3 mg/dl. Medications reviewed; 40 mg oral lasix BID, 112 mcg oral synthroid/day.     NUTRITION - FOCUSED PHYSICAL EXAM:  completed; no muscle or fat wasting, no edema noted at this time.   Diet Order:   Diet Order            Diet Heart Room service appropriate? Yes; Fluid consistency: Thin  Diet effective now              EDUCATION NEEDS:   No education needs have been identified at this time  Skin:  Skin Assessment: Reviewed RN Assessment  Last BM:  9/20  Height:   Ht Readings from Last 1 Encounters:  07/13/19 5\' 3"  (1.6 m)    Weight:   Wt Readings from Last 1 Encounters:  07/12/19 81.2 kg    Ideal Body Weight:  56.4 kg  BMI:  Body mass index is 31.71 kg/m.  Estimated Nutritional Needs:   Kcal:  2030-2270 kcal  Protein:  90-100 grams  Fluid:  >/= 2 L/day      Jarome Matin, MS, RD, LDN, [facility] Inpatient Clinical Dietitian Pager # [phone] After hours/weekend pager # [phone]

## 2019-07-13 NOTE — Progress Notes (Signed)
Pt's BP was 100/57 HR 82. Held morning Lasix and sildenafil. Pt asymptomatic. Dr [place] made aware.

## 2019-07-13 NOTE — Progress Notes (Signed)
Pt states he has had two BM's today, both of which were black in color. No bright red blood noted.

## 2019-07-13 NOTE — Progress Notes (Signed)
PROGRESS NOTE    Vincent Robinson  [id number] DOB: [birth year] DOA: 07/11/2019 PCP: Katherina Mires, MD   Brief Narrative:   Vincent Robinson  is a 85 y.o. male, w Asthma/Copd  (on home o2), ILD, moderate Pulmonary Hypertension (R heart cath 12/11/2017), OSA,  Hypothyoidism, CAD s/p CABG, CHF (EF 60-65%), mild Aortic stenosis, mild MR (cardiac echo 09/11/2018), AAA 4.8cm, , recent diagnosis of bladder cancer 3 days ago,  h/o diverticular bleeding 06/05/2017 apparently presents with c/o rectal bleeding starting 2 days ago. worse last nite and therefore presented to ED.   Pt denies fever, chills, n/v, abd pain, diarrhea.  Pt notes has 4 bm loose stool per day due to being on sildenafil for his pulmonary hypertension,  No change in bm.  Pt is taking aspirin but no other blood thinners.  He notes that the only takes the aspirin typically every other day.  Pt had a flex sigmoidoscopy in 2018, but colonoscopy was a while ago.  Pt presented due to concerns about rectal bleeding which caused hypotension in 2018.   In ED, T 98  p 85  R 26, Bp 124/61 pox 93% on O2 2L [place]. FOBT positive, rectal per ED melana. Wbc 12.1, hgb 14.6, Plt 297. Na 141, K 3.9, Bun 21, Creatinine 1.11. Ast 29, Alt 14. INR 1.1. Type and screen done.   Pt will be admitted for rectal bleeding. Probably diverticular.    Assessment & Plan:   Principal Problem:   Rectal bleeding Active Problems:   COPD (chronic obstructive pulmonary disease) (HCC)   ILD (interstitial lung disease) (HCC)   Lower GI bleed   Sleep apnea   Hypothyroidism   Coronary artery disease   AAA (abdominal aortic aneurysm) without rupture (HCC)   Pulmonary hypertension, unspecified (HCC)  Acute blood loss anemia Acute lower GI bleed, suspect diverticular patient presenting with 2 episodes of bright red blood per rectum.  History of diverticular bleed in 2018 in which he underwent  Flexible sigmoidoscopy without concerning findings.   Hemoglobin on admission  14.6.  --[facility] consulted, appreciate assistance --Hgb 14.6-->12.5-->10.6 --Full liquid diet --IV Feraheme today --Will repeat Hgb this afternoon --if significant recurrent active bright red bleeding, rec NM tagged RBC bleeding scan per GI --repeat CBC in AM   COPD, on home O2,  compensated  interstitial lung disease  pulmonary hypertension, mild  currently oxygenating well on his home regimen 2 L per nasal cannula. --Continue sildenafil 40 mg p.o. TID --continue Singulair 10 mg p.o. daily --continue Advair --albuterol p.r.n.  CAD s/p CABG Chronic diastolic congestive heart failure, compensated --holding aspirin 2/2 GI bleed as above --continue lasix 40mg  PO BID --continue statin --Strict I's and O's and daily weights  OSA: continue nocturnal CPAP  Hypothyroidism: Continue levothyroxine 112 mcg p.o. daily   DVT prophylaxis:  SCDs Code Status:  Full code Family Communication:  none Disposition Plan:  Continue inpatient, close monitoring of hemoglobin, further dependent on clinical course and further GI recommendations   Consultants:   [facility]  Procedures:  none  Antimicrobials:    none   Subjective: Patient seen examined at bedside, resting comfortably.  Patient states last blood in stool roughly 730 last night.  Reports clear liquid stool early this morning.  Denies any other complaints.  Discussed with patient GI recommends IV iron infusion today, ordered. No other complaints or concerns at this time.  Denies headache, no dizziness, no fever/chills /night sweats, no nausea /vomiting /diarrhea, no cough / congestion, no  chest pain, palpitations, no shortness of breath, no issues with bladder function, and no paresthesias.  No acute events overnight per nursing staff.  Objective: Vitals:   07/12/19 0548 07/12/19 2035 07/12/19 2046 07/13/19 0539  BP: (!) 106/55  (!) 99/45 98/69  Pulse: 60  74 83  Resp: 20  18 18   Temp: 97.7 F  (36.5 C)  97.9 F (36.6 C) 98.3 F (36.8 C)  TempSrc: Oral  Oral Oral  SpO2: 91%  94% 91%  Weight:  81.2 kg    Height:  5' 0.75" (1.543 m)      Intake/Output Summary (Last 24 hours) at 07/13/2019 0646 Last data filed at 07/12/2019 1400 Gross per 24 hour  Intake 140 ml  Output 301 ml  Net -161 ml   Filed Weights   07/12/19 0500 07/12/19 2035  Weight: 81.2 kg 81.2 kg    Examination:  General exam: Appears calm and comfortable  Respiratory system: Clear to auscultation. Respiratory effort normal. Cardiovascular system: S1 & S2 heard, RRR. No JVD, murmurs, rubs, gallops or clicks. No pedal edema. Gastrointestinal system: Abdomen is nondistended, soft and nontender. No organomegaly or masses felt. Normal bowel sounds heard. Central nervous system: Alert and oriented. No focal neurological deficits. Extremities: Symmetric 5 x 5 power. Skin: No rashes, lesions or ulcers Psychiatry: Judgement and insight appear normal. Mood & affect appropriate.     Data Reviewed: I have personally reviewed following labs and imaging studies  CBC: Recent Labs  Lab 07/11/19 2349 07/12/19 0346 07/12/19 2031 07/13/19 0438  WBC 12.1* 10.6*  --  11.6*  NEUTROABS 8.3*  --   --   --   HGB 14.6 12.5* 10.8* 10.6*  HCT 44.4 39.2 33.3* 32.5*  MCV 99.3 101.6*  --  100.0  PLT 297 251  --  0000000   Basic Metabolic Panel: Recent Labs  Lab 07/11/19 2349 07/12/19 0346 07/13/19 0438  NA 141 142 138  K 3.9 4.0 4.0  CL 103 107 106  CO2 25 26 24   GLUCOSE 128* 101* 107*  BUN 21 23 27*  CREATININE 1.11 1.04 0.92  CALCIUM 8.8* 8.4* 8.3*   GFR: Estimated Creatinine Clearance: 44.1 mL/min (by C-G formula based on SCr of 0.92 mg/dL). Liver Function Tests: Recent Labs  Lab 07/11/19 2349 07/12/19 0346  AST 29 24  ALT 14 15  ALKPHOS 103 88  BILITOT 0.7 0.5  PROT 7.7 6.6  ALBUMIN 3.8 3.2*   No results for input(s): LIPASE, AMYLASE in the last 168 hours. No results for input(s): AMMONIA in the  last 168 hours. Coagulation Profile: Recent Labs  Lab 07/11/19 2349  INR 1.1   Cardiac Enzymes: No results for input(s): CKTOTAL, CKMB, CKMBINDEX, TROPONINI in the last 168 hours. BNP (last 3 results) No results for input(s): PROBNP in the last 8760 hours. HbA1C: No results for input(s): HGBA1C in the last 72 hours. CBG: No results for input(s): GLUCAP in the last 168 hours. Lipid Profile: No results for input(s): CHOL, HDL, LDLCALC, TRIG, CHOLHDL, LDLDIRECT in the last 72 hours. Thyroid Function Tests: No results for input(s): TSH, T4TOTAL, FREET4, T3FREE, THYROIDAB in the last 72 hours. Anemia Panel: No results for input(s): VITAMINB12, FOLATE, FERRITIN, TIBC, IRON, RETICCTPCT in the last 72 hours. Sepsis Labs: No results for input(s): PROCALCITON, LATICACIDVEN in the last 168 hours.  Recent Results (from the past 240 hour(s))  SARS CORONAVIRUS 2 (TAT 6-24 HRS) Nasopharyngeal Nasopharyngeal Swab     Status: None   Collection Time: 07/12/19  12:09 AM   Specimen: Nasopharyngeal Swab  Result Value Ref Range Status   SARS Coronavirus 2 NEGATIVE NEGATIVE Final    Comment: (NOTE) SARS-CoV-2 target nucleic acids are NOT DETECTED. The SARS-CoV-2 RNA is generally detectable in upper and lower respiratory specimens during the acute phase of infection. Negative results do not preclude SARS-CoV-2 infection, do not rule out co-infections with other pathogens, and should not be used as the sole basis for treatment or other patient management decisions. Negative results must be combined with clinical observations, patient history, and epidemiological information. The expected result is Negative. Fact Sheet for Patients: SugarRoll.be Fact Sheet for Healthcare Providers: https://www.woods-mathews.com/ This test is not yet approved or cleared by the [place] FDA and  has been authorized for detection and/or diagnosis of SARS-CoV-2 by FDA under  an Emergency Use Authorization (EUA). This EUA will remain  in effect (meaning this test can be used) for the duration of the COVID-19 declaration under Section 56 4(b)(1) of the Act, 21 U.S.C. section 360bbb-3(b)(1), unless the authorization is terminated or revoked sooner. Performed at [facility], [place] [address], [place], [place] [address]          Radiology Studies: No results found.      Scheduled Meds:  fluticasone  2 spray Each Nare Daily   furosemide  40 mg Oral BID   influenza vaccine adjuvanted  0.5 mL Intramuscular Tomorrow-1000   levothyroxine  112 mcg Oral QAC breakfast   loratadine  10 mg Oral Daily   mouth rinse  15 mL Mouth Rinse BID   montelukast  10 mg Oral QHS   sildenafil  40 mg Oral TID   simvastatin  10 mg Oral QHS   Continuous Infusions:  ferumoxytol       LOS: 1 day    Time spent: 33 minutes spent on chart review, discussion with nursing staff, consultants, updating family and interview/physical exam; more than 50% of that time was spent in counseling and/or coordination of care.     Hykeem Ojeda J [place] DO [facility] Pager [phone]  If 7PM-7AM, please contact night-coverage [email or web address] Password [facility] 07/13/2019, 6:46 AM

## 2019-07-14 DIAGNOSIS — D62 Acute posthemorrhagic anemia: Secondary | ICD-10-CM

## 2019-07-14 DIAGNOSIS — K5731 Diverticulosis of large intestine without perforation or abscess with bleeding: Principal | ICD-10-CM

## 2019-07-14 LAB — BASIC METABOLIC PANEL
Anion gap: 10 (ref 5–15)
BUN: 31 mg/dL — ABNORMAL HIGH (ref 8–23)
CO2: 20 mmol/L — ABNORMAL LOW (ref 22–32)
Calcium: 8.6 mg/dL — ABNORMAL LOW (ref 8.9–10.3)
Chloride: 106 mmol/L (ref 98–111)
Creatinine, Ser: 0.99 mg/dL (ref 0.61–1.24)
GFR calc Af Amer: >60 mL/min (ref >60)
GFR calc non Af Amer: >60 mL/min (ref >60)
Glucose, Bld: 111 mg/dL — ABNORMAL HIGH (ref 70–99)
Potassium: 4.6 mmol/L (ref 3.5–5.1)
Sodium: 136 mmol/L (ref 135–145)

## 2019-07-14 LAB — CBC
HCT: 32.6 % — ABNORMAL LOW (ref 39.0–52.0)
Hemoglobin: 10.6 g/dL — ABNORMAL LOW (ref 13.0–17.0)
MCH: 32.5 pg (ref 26.0–34.0)
MCHC: 32.5 g/dL (ref 30.0–36.0)
MCV: 100 fL (ref 80.0–100.0)
Platelets: 241 10*3/uL (ref 150–400)
RBC: 3.26 MIL/uL — ABNORMAL LOW (ref 4.22–5.81)
RDW: 13.6 % (ref 11.5–15.5)
WBC: 13.2 10*3/uL — ABNORMAL HIGH (ref 4.0–10.5)
nRBC: 0 % (ref 0.0–0.2)

## 2019-07-14 MED ORDER — ASPIRIN 81 MG PO CHEW: 81.0000 mg | CHEWABLE_TABLET | Freq: Every day | ORAL | Status: DC

## 2019-07-14 NOTE — Discharge Summary (Signed)
Physician Discharge Summary  Vincent Robinson [id number] DOB: 1923-07-18 DOA: 07/11/2019  PCP: Katherina Mires, MD  Admit date: 07/11/2019 Discharge date: 07/14/2019  Admitted From: Home Disposition:  Home  Recommendations for Outpatient Follow-up:  1. Follow up with PCP in 1 week 2. Please obtain CBC in one week 3. Hold aspirin x1 week following discharge due to likely  Diverticular LGIB  Home Health: No Equipment/Devices: None  Discharge Condition: stable CODE STATUS:  Full code Diet recommendation: Heart Healthy  History of present illness:  [name],w Asthma/Copd (on homeo2), ILD,moderate Pulmonary Hypertension (R heart cath 12/11/2017), OSA, Hypothyoidism, CAD s/p CABG, CHF (EF 60-65%), mild Aortic stenosis, mild MR (cardiac echo 09/11/2018), AAA4.8cm,,recent diagnosis of bladder cancer 3 days ago,h/o diverticular bleeding 8/15/2018apparently presents with c/o rectal bleeding starting 2 days ago. worselast nite and therefore presented to ED.   Pt denies fever, chills, n/v, abd pain, diarrhea. Pt notes has 4 bm loose stool per day due to being on sildenafil for his pulmonary hypertension, No change in bm. Pt is taking aspirin but no other blood thinners. He notes that the only takes the aspirin typically every other day. Pt had a flex sigmoidoscopy in 2018, but colonoscopy was a while ago. Pt presented due to concerns about rectal bleeding which caused hypotension in 2018.   In ED,T 98 p 85 R 26, Bp 124/61 pox 93% on O2 2L [place]. FOBT positive, rectal per ED melana. Wbc 12.1, hgb 14.6, Plt 297. Na 141, K 3.9, Bun 21, Creatinine 1.11. Ast 29, Alt 14. INR 1.1. Type and screen done.   Pt will be admitted for rectal bleeding. Probably diverticular.  Hospital course:  Acute lower GI bleed, suspect diverticular patient presenting with 2 episodes of bright red blood per rectum.  History of diverticular bleed in 2018 in which he underwent  Flexible  sigmoidoscopy without concerning findings.   Hemoglobin on admission 14.6. [facility] was consulted and followed during the hospital course.   His hemoglobin trended down to 10.6, and remained stable.  He was given 1 dose of IV Feraheme per GI.  His acute bleeding has subsided with only mild dark stools.  GI without any further informations at this time,   And okay to discharge home.  Recommend follow-up with PCP in 1 week following hospitalization for repeat CBC.  Recommend holding aspirin for 1 week following hospitalization. appreciate assistance   COPD, on home O2,  compensated  interstitial lung disease  pulmonary hypertension, mild  currently oxygenating well on his home regimen 2 L per nasal cannula.   Continue home Singulair, sildenafil, Advair, and albuterol prn.  CAD s/p CABG Chronic diastolic congestive heart failure, compensated Continue lasix 40mg  PO BID. Continue statin. Hold home aspirin for one week before restarting.   OSA: continue nocturnal CPAP  Hypothyroidism: Continue levothyroxine 112 mcg p.o. daily   Discharge Diagnoses:  Active Problems:   COPD (chronic obstructive pulmonary disease) (HCC)   ILD (interstitial lung disease) (HCC)   Lower GI bleed   Sleep apnea   Hypothyroidism   Coronary artery disease   AAA (abdominal aortic aneurysm) without rupture ([place])   Pulmonary hypertension, unspecified ([place])    Discharge Instructions  Discharge Instructions    Call MD for:   Complete by: As directed    Recurrent rectal bleeding   Call MD for:  difficulty breathing, headache or visual disturbances   Complete by: As directed    Call MD for:  extreme fatigue   Complete by:  As directed    Call MD for:  persistant dizziness or light-headedness   Complete by: As directed    Call MD for:  persistant nausea and vomiting   Complete by: As directed    Call MD for:  severe uncontrolled pain   Complete by: As directed    Call MD for:  temperature  >100.4   Complete by: As directed    Diet - low sodium heart healthy   Complete by: As directed    Discharge instructions   Complete by: As directed    Hold Aspirin for 1 week following discharge. Follow-up with PCP in 1 week with repeat hemoglobin level   Increase activity slowly   Complete by: As directed      Allergies as of 07/14/2019   No Known Allergies     Medication List    TAKE these medications   albuterol 108 (90 Base) MCG/ACT inhaler Commonly known as: VENTOLIN HFA Inhale 1 puff into the lungs 2 (two) times daily.   amoxicillin 500 MG tablet Commonly known as: AMOXIL Take 4 tablets (2,000 mg total) by mouth as needed (prior to dental work).   aspirin 81 MG chewable tablet Commonly known as: Aspirin 81 Chew 1 tablet (81 mg total) by mouth daily. Restart in 1 week following hospitalization What changed: additional instructions   fluticasone 27.5 MCG/SPRAY nasal spray Commonly known as: VERAMYST Place 1 spray into the nose daily.   Fluticasone-Salmeterol 113-14 MCG/ACT Aepb INHALE 1 PUFF TWICE A DAY   furosemide 40 MG tablet Commonly known as: LASIX Take 1 tablet (40 mg total) by mouth daily.   levothyroxine 112 MCG tablet Commonly known as: SYNTHROID Take 112 mcg by mouth daily before breakfast.   loratadine 10 MG tablet Commonly known as: CLARITIN Take 10 mg by mouth daily.   macitentan 10 MG tablet Commonly known as: OPSUMIT Take 1 tablet (10 mg total) by mouth daily.   montelukast 10 MG tablet Commonly known as: SINGULAIR TAKE ONE TABLET BY MOUTH AT BEDTIME   oxybutynin 5 MG tablet Commonly known as: DITROPAN Take 5 mg by mouth every 8 (eight) hours as needed for bladder spasms.   sildenafil 20 MG tablet Commonly known as: REVATIO TAKE TWO TABLETS BY MOUTH THREE TIMES A DAY   simvastatin 10 MG tablet Commonly known as: ZOCOR Take 10 mg by mouth at bedtime.      Follow-up Information    Katherina Mires, MD. Schedule an appointment as  soon as possible for a visit in 1 week(s).   Specialty: Family Medicine Contact information: [place] [address] [phone]        Lorretta Harp, MD .   Specialties: Cardiology, Radiology Contact information: [address] [place] [address] [phone]          No Known Allergies  Consultations:  [facility]   Procedures/Studies:  No results found.    Subjective:    Patient seen examined at bedside, resting comfortably.  Hemoglobin has remained stable over the past   24 hours.  Patient denies any recurrence of bright red stool per rectum.  Only reports occasional dark "specks" in his stool.  No other complaints at this time, ready for discharge home.  GI seen this morning and okay to discharge home from their standpoint.  Discussed with patient holding aspirin for 1 week.  No other complaints or concerns at this time.  Denies headache, no dizziness, no chest  pain, palpitations, no nausea/ vomiting/ diarrhea, no shortness of breath, no abdominal pain, no weakness, no fatigue, no paresthesias.  No acute events overnight per nursing staff.   Discharge Exam: Vitals:   07/14/19 0816 07/14/19 1035  BP: (!) 99/56 107/60  Pulse: 73 84  Resp: (!) 24 (!) 24  Temp:    SpO2: 93% 96%   Vitals:   07/13/19 2001 07/14/19 0543 07/14/19 0816 07/14/19 1035  BP: (!) 107/58 101/62 (!) 99/56 107/60  Pulse: 66 73 73 84  Resp: 18 18 (!) 24 (!) 24  Temp: 98 F (36.7 C) 98.3 F (36.8 C)    TempSrc: Oral Oral    SpO2: 95% 93% 93% 96%  Weight:  80 kg    Height:        General: Pt is alert, awake, not in acute distress Cardiovascular: RRR, S1/S2 +, no rubs, no gallops Respiratory: CTA bilaterally, no wheezing, no rhonchi Abdominal: Soft, NT, ND, bowel sounds + Extremities: no edema, no cyanosis    The results of significant diagnostics from this hospitalization (including imaging, microbiology, ancillary and  laboratory) are listed below for reference.     Microbiology: Recent Results (from the past 240 hour(s))  SARS CORONAVIRUS 2 (TAT 6-24 HRS) Nasopharyngeal Nasopharyngeal Swab     Status: None   Collection Time: 07/12/19 12:09 AM   Specimen: Nasopharyngeal Swab  Result Value Ref Range Status   SARS Coronavirus 2 NEGATIVE NEGATIVE Final    Comment: (NOTE) SARS-CoV-2 target nucleic acids are NOT DETECTED. The SARS-CoV-2 RNA is generally detectable in upper and lower respiratory specimens during the acute phase of infection. Negative results do not preclude SARS-CoV-2 infection, do not rule out co-infections with other pathogens, and should not be used as the sole basis for treatment or other patient management decisions. Negative results must be combined with clinical observations, patient history, and epidemiological information. The expected result is Negative. Fact Sheet for Patients: SugarRoll.be Fact Sheet for Healthcare Providers: https://www.woods-mathews.com/ This test is not yet approved or cleared by the [place] FDA and  has been authorized for detection and/or diagnosis of SARS-CoV-2 by FDA under an Emergency Use Authorization (EUA). This EUA will remain  in effect (meaning this test can be used) for the duration of the COVID-19 declaration under Section 56 4(b)(1) of the Act, 21 U.S.C. section 360bbb-3(b)(1), unless the authorization is terminated or revoked sooner. Performed at [facility], [place] [address], [place], [place] [address]      Labs: BNP (last 3 results) No results for input(s): BNP in the last 8760 hours. Basic Metabolic Panel: Recent Labs  Lab 07/11/19 2349 07/12/19 0346 07/13/19 0438 07/14/19 0411  NA 141 142 138 136  K 3.9 4.0 4.0 4.6  CL 103 107 106 106  CO2 25 26 24  20*  GLUCOSE 128* 101* 107* 111*  BUN 21 23 27* 31*  CREATININE 1.11 1.04 0.92 0.99  CALCIUM 8.8* 8.4* 8.3* 8.6*    Liver Function Tests: Recent Labs  Lab 07/11/19 2349 07/12/19 0346  AST 29 24  ALT 14 15  ALKPHOS 103 88  BILITOT 0.7 0.5  PROT 7.7 6.6  ALBUMIN 3.8 3.2*   No results for input(s): LIPASE, AMYLASE in the last 168 hours. No results for input(s): AMMONIA in the last 168 hours. CBC: Recent Labs  Lab 07/11/19 2349 07/12/19 0346 07/12/19 2031 07/13/19 0438 07/13/19 1219 07/14/19 0411  WBC 12.1* 10.6*  --  11.6*  --  13.2*  NEUTROABS 8.3*  --   --   --   --   --  HGB 14.6 12.5* 10.8* 10.6* 11.5* 10.6*  HCT 44.4 39.2 33.3* 32.5* 35.6* 32.6*  MCV 99.3 101.6*  --  100.0  --  100.0  PLT 297 251  --  243  --  241   Cardiac Enzymes: No results for input(s): CKTOTAL, CKMB, CKMBINDEX, TROPONINI in the last 168 hours. BNP: Invalid input(s): POCBNP CBG: No results for input(s): GLUCAP in the last 168 hours. D-Dimer No results for input(s): DDIMER in the last 72 hours. Hgb A1c No results for input(s): HGBA1C in the last 72 hours. Lipid Profile No results for input(s): CHOL, HDL, LDLCALC, TRIG, CHOLHDL, LDLDIRECT in the last 72 hours. Thyroid function studies No results for input(s): TSH, T4TOTAL, T3FREE, THYROIDAB in the last 72 hours.  Invalid input(s): FREET3 Anemia work up No results for input(s): VITAMINB12, FOLATE, FERRITIN, TIBC, IRON, RETICCTPCT in the last 72 hours. Urinalysis No results found for: COLORURINE, APPEARANCEUR, [place], [place], [place], [place], [place], [place], PROTEINUR, UROBILINOGEN, NITRITE, LEUKOCYTESUR Sepsis Labs Invalid input(s): PROCALCITONIN,  WBC,  LACTICIDVEN Microbiology Recent Results (from the past 240 hour(s))  SARS CORONAVIRUS 2 (TAT 6-24 HRS) Nasopharyngeal Nasopharyngeal Swab     Status: None   Collection Time: 07/12/19 12:09 AM   Specimen: Nasopharyngeal Swab  Result Value Ref Range Status   SARS Coronavirus 2 NEGATIVE NEGATIVE Final    Comment: (NOTE) SARS-CoV-2 target nucleic acids are NOT DETECTED. The SARS-CoV-2 RNA  is generally detectable in upper and lower respiratory specimens during the acute phase of infection. Negative results do not preclude SARS-CoV-2 infection, do not rule out co-infections with other pathogens, and should not be used as the sole basis for treatment or other patient management decisions. Negative results must be combined with clinical observations, patient history, and epidemiological information. The expected result is Negative. Fact Sheet for Patients: SugarRoll.be Fact Sheet for Healthcare Providers: https://www.woods-mathews.com/ This test is not yet approved or cleared by the [place] FDA and  has been authorized for detection and/or diagnosis of SARS-CoV-2 by FDA under an Emergency Use Authorization (EUA). This EUA will remain  in effect (meaning this test can be used) for the duration of the COVID-19 declaration under Section 56 4(b)(1) of the Act, 21 U.S.C. section 360bbb-3(b)(1), unless the authorization is terminated or revoked sooner. Performed at [facility], [place] [address] [place], [place] [address]      Time coordinating discharge: Over 30 minutes  SIGNED:   Donnamarie Poag [place] DO  [facility] 07/14/2019, 10:45 AM

## 2019-07-14 NOTE — Progress Notes (Signed)
Held lasix due to BP 99/56, HR 73. Dr [place] made aware.

## 2019-07-14 NOTE — Progress Notes (Signed)
   07/14/19 0816  Vitals  BP (!) 99/56  MAP (mmHg) 69  BP Location Right Arm  BP Method Automatic  Patient Position (if appropriate) Lying  Pulse Rate 73  Pulse Rate Source Monitor  Resp (!) 24  Oxygen Therapy  SpO2 93 %  Pain Assessment  Pain Scale 0-10  Pain Score 0  MEWS Score  MEWS RR 1  MEWS Pulse 0  MEWS Systolic 1  MEWS LOC 0  MEWS Temp 0  MEWS Score 2  MEWS Score Color Yellow  MEWS Assessment  Is this an acute change? Yes  MEWS guidelines implemented *See [place]  Provider Notification  Provider Name/Title [place]   Date Provider Notified 07/14/19  Time Provider Notified [phone]  Notification Type Page  Notification Reason Change in status  Response No new orders  Date of Provider Response 07/14/19  Time of Provider Response [phone]

## 2019-07-14 NOTE — Progress Notes (Addendum)
    Progress Note   Subjective  Chief Complaint: GI bleed, suspected diverticular  Continues with "very small bites or blood" per patient.  He tells me he is ready to go home if we want him to.  No abdominal pain.   Objective   Vital signs in last 24 hours: Temp:  [98 F (36.7 C)-98.7 F (37.1 C)] 98.3 F (36.8 C) (09/22 0543) Pulse Rate:  [64-73] 73 (09/22 0816) Resp:  [16-24] 24 (09/22 0816) BP: (99-107)/(54-62) 99/56 (09/22 0816) SpO2:  [93 %-100 %] 93 % (09/22 0816) Weight:  [80 kg] 80 kg (09/22 0543) Last BM Date: 07/13/19 General:    white male in NAD Heart:  Regular rate and rhythm; no murmurs Lungs: Respirations even and unlabored, lungs CTA bilaterally Abdomen:  Soft, nontender and nondistended. Normal bowel sounds. Extremities:  Without edema. Neurologic:  Alert and oriented,  grossly normal neurologically. Psych:  Cooperative. Normal mood and affect.  Intake/Output from previous day: 09/21 0701 - 09/22 0700 In: 903.8 [P.O.:900; I.V.:3.8] Out: -   Lab Results: Recent Labs    07/12/19 0346  07/13/19 0438 07/13/19 1219 07/14/19 0411  WBC 10.6*  --  11.6*  --  13.2*  HGB 12.5*   < > 10.6* 11.5* 10.6*  HCT 39.2   < > 32.5* 35.6* 32.6*  PLT 251  --  243  --  241   < > = values in this interval not displayed.   BMET Recent Labs    07/12/19 0346 07/13/19 0438 07/14/19 0411  NA 142 138 136  K 4.0 4.0 4.6  CL 107 106 106  CO2 26 24 20*  GLUCOSE 101* 107* 111*  BUN 23 27* 31*  CREATININE 1.04 0.92 0.99  CALCIUM 8.4* 8.3* 8.6*   LFT Recent Labs    07/12/19 0346  PROT 6.6  ALBUMIN 3.2*  AST 24  ALT 15  ALKPHOS 88  BILITOT 0.5   PT/INR Recent Labs    07/11/19 2349  LABPROT 14.2  INR 1.1    Assessment / Plan:   Assessment: 1.  GI bleeding: Presumed from colonic diverticulosis noted on Dr. Benson [place] colonoscopy 2 years ago, continues with very small amounts of "black/old blood", hemoglobin is holding steady, doing well with heart healthy diet   Plan: 1.  We are okay with discharge home today from GI standpoint. 2.  We will sign off.  Thank you for your kind consultation.   LOS: 2 days   Levin Erp  07/14/2019, 9:48 AM   I have discussed the case with the PA, and that is the plan I formulated. I personally interviewed and examined the patient.  CC: diverticular bleeding Acute blood loss anemia  Resolved.  Hgb stable.  WBC rising, does not seem to show sings of infection. Signing off - [facility] planning to discharge today.  Nelida Meuse III Office: [phone]

## 2019-07-14 NOTE — Progress Notes (Signed)
Discussed all discharge information with patient including medications, follow-up and prescriptions. IV removed and belongings returned to patient. No further question ask. Patient currently waiting for pick up.

## 2019-07-14 NOTE — Progress Notes (Signed)
Held sildenafil due to BP 99/56 HR 73

## 2019-07-28 ENCOUNTER — Ambulatory Visit: Payer: Medicare Other | Admitting: Cardiovascular Disease

## 2019-08-04 ENCOUNTER — Other Ambulatory Visit: Payer: Self-pay

## 2019-08-04 ENCOUNTER — Encounter: Payer: Self-pay | Admitting: Cardiovascular Disease

## 2019-08-04 ENCOUNTER — Ambulatory Visit (INDEPENDENT_AMBULATORY_CARE_PROVIDER_SITE_OTHER): Payer: Medicare Other | Admitting: Cardiovascular Disease

## 2019-08-04 VITALS — BP 122/68 | HR 100 | Ht 68.0 in | Wt 176.6 lb

## 2019-08-04 DIAGNOSIS — I272 Pulmonary hypertension, unspecified: Secondary | ICD-10-CM

## 2019-08-04 DIAGNOSIS — I4891 Unspecified atrial fibrillation: Secondary | ICD-10-CM | POA: Diagnosis not present

## 2019-08-04 DIAGNOSIS — I714 Abdominal aortic aneurysm, without rupture, unspecified: Secondary | ICD-10-CM

## 2019-08-04 DIAGNOSIS — Z008 Encounter for other general examination: Secondary | ICD-10-CM | POA: Diagnosis not present

## 2019-08-04 DIAGNOSIS — I2510 Atherosclerotic heart disease of native coronary artery without angina pectoris: Secondary | ICD-10-CM | POA: Diagnosis not present

## 2019-08-04 DIAGNOSIS — E782 Mixed hyperlipidemia: Secondary | ICD-10-CM

## 2019-08-04 DIAGNOSIS — I350 Nonrheumatic aortic (valve) stenosis: Secondary | ICD-10-CM

## 2019-08-04 NOTE — Assessment & Plan Note (Signed)
No prior history of atrial fibrillation although he has A. fib with a ventricular response of 100 on his EKG today with right bundle branch block and right axis deviation.  Given his age, comorbidities and recent rectal bleeding he is not a candidate for oral anticoagulation.

## 2019-08-04 NOTE — Assessment & Plan Note (Signed)
History of CAD status post coronary artery bypass grafting over 10 years ago.  He underwent repeat cardiac catheterization prior to his endoluminal stent graft in [place] 10/09 revealing patent grafts.  He denies chest pain or shortness of breath.

## 2019-08-04 NOTE — Assessment & Plan Note (Signed)
History of severe pulmonary hypertension on Revatio.  Dr. Haroldine Laws wanted to add a drug called Opsumit however this was cost prohibitive.

## 2019-08-04 NOTE — Progress Notes (Signed)
08/04/2019 Vincent Robinson   [birth year]  [id number]  Primary Physician Briscoe, Jannifer Rodney, MD Primary Cardiologist: Lorretta Harp MD [name], [place]  HPI:  Vincent Robinson is a 85 y.o.  married, father of 2, grandfather of 2 grandchildren originally from [place] who lives in [place] now be closer to his family.He is a Theatre stage manager. He was referred by Dr. Doreene Nest for cardiovascular evaluation because of increasing dyspnea on exertion thought not to be a pulmonary etiology.I last saw him in the office  07/09/2018. He does have a history of hyperlipidemia. He's never had a heart attack or stroke. He had coronary artery bypass grafting 5-10 years ago with recath prior to his endoluminal stent graft 10/09 that showed patent grafts. He had an endoluminal stent graft for abdominal aortic aneurysm in [place] 10/09 with recent Doppler performed 2/18 revealed aortic dimensions of approximately 4.4 x 4.6 cm. Recently had a spontaneous pneumothorax that was treated conservatively as well as a diverticular bleed requiring significant blood transfusion. He is currently on continuous nasal oxygen over the last 2 months. He was begun on a diuretic which improved his breathing. Since I saw him about a month ago we obtained a 2-D echo that showed normal LV systolic function, mild to moderate aortic stenosis with severe pulmonary hypertension. Abdominal Dopplers revealed a abdominal aorta measuring 2.9 cm. He does relate increasing dyspnea on exertion despite supplemental oxygen. He does have 1-2+ pitting edema. There is also had a history of spontaneous pneumothorax on the right hear good breath sounds bilaterally. I'm going to obtain a PA and lateral chest x-ray, double diuretics and obtain a CT rule out chronic PE as well as apharmacologicMyoview stress test to rule out an ischemic etiology which was performed 10/23/17 and was entirely normal.I'm referring him to the advanced heart  failure clinic for further evaluation . I referred him to Dr. Jeffie Pollock who performed a right heart cath confirming pulmonary hypertension and began him on Revatio which has resulted in improvement in his symptoms.  Since I saw him a year ago he is remained stable.  He was recently seen in the emergency room for rectal bleeding.  He is on chronic O2 and BiPAP at night. He is on Revatio  for his pulmonary hypertension and sees Dr. Haroldine Laws for this.  He currently is in A. fib with controlled ventricular spots but is not a candidate for oral anticoagulation because of his age, comorbidities and recent rectal bleeding.    Current Meds  Medication Sig  . albuterol (PROVENTIL HFA;VENTOLIN HFA) 108 (90 Base) MCG/ACT inhaler Inhale 1 puff into the lungs 2 (two) times daily.  Marland Kitchen amoxicillin (AMOXIL) 500 MG tablet Take 4 tablets (2,000 mg total) by mouth as needed (prior to dental work).  Marland Kitchen aspirin (ASPIRIN 81) 81 MG chewable tablet Chew 1 tablet (81 mg total) by mouth daily. Restart in 1 week following hospitalization  . fluticasone (VERAMYST) 27.5 MCG/SPRAY nasal spray Place 1 spray into the nose daily.  . Fluticasone-Salmeterol 113-14 MCG/ACT AEPB INHALE 1 PUFF TWICE A DAY  . furosemide (LASIX) 40 MG tablet Take 1 tablet (40 mg total) by mouth daily.  Marland Kitchen levothyroxine (SYNTHROID, LEVOTHROID) 112 MCG tablet Take 112 mcg by mouth daily before breakfast.  . loratadine (CLARITIN) 10 MG tablet Take 10 mg by mouth daily.  . montelukast (SINGULAIR) 10 MG tablet TAKE ONE TABLET BY MOUTH AT BEDTIME  . sildenafil (REVATIO) 20 MG tablet TAKE TWO TABLETS BY MOUTH  THREE TIMES A DAY  . simvastatin (ZOCOR) 10 MG tablet Take 10 mg by mouth at bedtime.     No Known Allergies  Social History   Socioeconomic History  . Marital status: Married    Spouse name: Not on file  . Number of children: Not on file  . Years of education: Not on file  . Highest education level: Not on file  Occupational History  . Not  on file  Social Needs  . Financial resource strain: Not on file  . Food insecurity    Worry: Not on file    Inability: Not on file  . Transportation needs    Medical: Not on file    Non-medical: Not on file  Tobacco Use  . Smoking status: Former Smoker    Packs/day: 0.50    Quit date: 08/06/1961    Years since quitting: 58.0  . Smokeless tobacco: Never Used  Substance and Sexual Activity  . Alcohol use: Not Currently  . Drug use: Not on file  . Sexual activity: Not on file  Lifestyle  . Physical activity    Days per week: Not on file    Minutes per session: Not on file  . Stress: Not on file  Relationships  . Social Herbalist on phone: Not on file    Gets together: Not on file    Attends religious service: Not on file    Active member of club or organization: Not on file    Attends meetings of clubs or organizations: Not on file    Relationship status: Not on file  . Intimate partner violence    Fear of current or ex partner: Not on file    Emotionally abused: Not on file    Physically abused: Not on file    Forced sexual activity: Not on file  Other Topics Concern  . Not on file  Social History Narrative  . Not on file     Review of Systems: General: negative for chills, fever, night sweats or weight changes.  Cardiovascular: negative for chest pain, dyspnea on exertion, edema, orthopnea, palpitations, paroxysmal nocturnal dyspnea or shortness of breath Dermatological: negative for rash Respiratory: negative for cough or wheezing Urologic: negative for hematuria Abdominal: negative for nausea, vomiting, diarrhea, bright red blood per rectum, melena, or hematemesis Neurologic: negative for visual changes, syncope, or dizziness All other systems reviewed and are otherwise negative except as noted above.    Blood pressure 122/68, pulse 100, height 5\' 8"  (1.727 m), weight 176 lb 9.6 oz (80.1 kg), SpO2 90 %.  General appearance: alert and no distress  Neck: no adenopathy, no carotid bruit, no JVD, supple, symmetrical, trachea midline and thyroid not enlarged, symmetric, no tenderness/mass/nodules Lungs: clear to auscultation bilaterally Heart: irregularly irregular rhythm Extremities: Mild peripheral edema wearing compression stockings Pulses: 2+ and symmetric Skin: Skin color, texture, turgor normal. No rashes or lesions Neurologic: Alert and oriented X 3, normal strength and tone. Normal symmetric reflexes. Normal coordination and gait  EKG atrial fibrillation with a ventricular spots of 100, right ventricular hypertrophy with right axis deviation.  I personally reviewed this EKG.  ASSESSMENT AND PLAN:   Hyperlipidemia Hyperlipidemia history of hyperlipidemia on statin therapy followed by his PCP  Coronary artery disease History of CAD status post coronary artery bypass grafting over 10 years ago.  He underwent repeat cardiac catheterization prior to his endoluminal stent graft in [place] 10/09 revealing patent grafts.  He denies chest pain or  shortness of breath.  AAA (abdominal aortic aneurysm) without rupture (HCC) History of abdominal aortic aneurysm status post endoluminal stent grafting in [place] 10/09 with abdominal ultrasound performed 09/25/2017 revealing a aortic dimension of 2.9 x 2.9.  Pulmonary hypertension, unspecified ([place]) History of severe pulmonary hypertension on Revatio.  Dr. Haroldine Laws wanted to add a drug called Opsumit however this was cost prohibitive.  Aortic stenosis, moderate 2D echo performed 09/11/2018 revealed normal LV function with mild aortic stenosis and severe pulmonary hypertension.  Atrial fibrillation (HCC) No prior history of atrial fibrillation although he has A. fib with a ventricular response of 100 on his EKG today with right bundle branch block and right axis deviation.  Given his age, comorbidities and recent rectal bleeding he is not a candidate for oral anticoagulation.       Lorretta Harp MD FACP,FACC,FAHA, [facility] 08/04/2019 3:06 PM

## 2019-08-04 NOTE — Assessment & Plan Note (Signed)
History of abdominal aortic aneurysm status post endoluminal stent grafting in [place] 10/09 with abdominal ultrasound performed 09/25/2017 revealing a aortic dimension of 2.9 x 2.9.

## 2019-08-04 NOTE — Assessment & Plan Note (Signed)
2D echo performed 09/11/2018 revealed normal LV function with mild aortic stenosis and severe pulmonary hypertension.

## 2019-08-04 NOTE — Patient Instructions (Addendum)

## 2019-08-04 NOTE — Assessment & Plan Note (Signed)
Hyperlipidemia history of hyperlipidemia on statin therapy followed by his PCP

## 2019-08-06 ENCOUNTER — Telehealth (HOSPITAL_COMMUNITY): Payer: Self-pay | Admitting: Pharmacy Technician

## 2019-08-06 NOTE — Telephone Encounter (Signed)
Received message that patient needs assistance with Opsumit. Called and spoke with patient who is aware that we are starting the process for assistance.  Will follow up.  Charlann Boxer, CPhT

## 2019-08-13 ENCOUNTER — Other Ambulatory Visit: Payer: Self-pay

## 2019-08-13 ENCOUNTER — Ambulatory Visit (HOSPITAL_COMMUNITY)
Admission: RE | Admit: 2019-08-13 | Discharge: 2019-08-13 | Disposition: A | Discharge status: Home / Self Care | Payer: Medicare Other | Source: Ambulatory Visit | Attending: Internal Medicine | Admitting: Internal Medicine

## 2019-08-13 ENCOUNTER — Encounter (HOSPITAL_COMMUNITY): Payer: Self-pay | Admitting: Internal Medicine

## 2019-08-13 VITALS — BP 90/60 | HR 71 | Wt 184.0 lb

## 2019-08-13 DIAGNOSIS — Z951 Presence of aortocoronary bypass graft: Secondary | ICD-10-CM | POA: Insufficient documentation

## 2019-08-13 DIAGNOSIS — Z825 Family history of asthma and other chronic lower respiratory diseases: Secondary | ICD-10-CM | POA: Insufficient documentation

## 2019-08-13 DIAGNOSIS — I4819 Other persistent atrial fibrillation: Secondary | ICD-10-CM | POA: Diagnosis not present

## 2019-08-13 DIAGNOSIS — I4891 Unspecified atrial fibrillation: Secondary | ICD-10-CM

## 2019-08-13 DIAGNOSIS — Z9981 Dependence on supplemental oxygen: Secondary | ICD-10-CM | POA: Diagnosis not present

## 2019-08-13 DIAGNOSIS — Z79899 Other long term (current) drug therapy: Secondary | ICD-10-CM | POA: Insufficient documentation

## 2019-08-13 DIAGNOSIS — J9610 Chronic respiratory failure, unspecified whether with hypoxia or hypercapnia: Secondary | ICD-10-CM | POA: Insufficient documentation

## 2019-08-13 DIAGNOSIS — R0600 Dyspnea, unspecified: Secondary | ICD-10-CM | POA: Diagnosis present

## 2019-08-13 DIAGNOSIS — Z7951 Long term (current) use of inhaled steroids: Secondary | ICD-10-CM | POA: Insufficient documentation

## 2019-08-13 DIAGNOSIS — I2729 Other secondary pulmonary hypertension: Secondary | ICD-10-CM | POA: Diagnosis not present

## 2019-08-13 DIAGNOSIS — Z87891 Personal history of nicotine dependence: Secondary | ICD-10-CM | POA: Diagnosis not present

## 2019-08-13 DIAGNOSIS — Z85828 Personal history of other malignant neoplasm of skin: Secondary | ICD-10-CM | POA: Diagnosis not present

## 2019-08-13 DIAGNOSIS — G4733 Obstructive sleep apnea (adult) (pediatric): Secondary | ICD-10-CM | POA: Diagnosis not present

## 2019-08-13 DIAGNOSIS — Z79890 Hormone replacement therapy (postmenopausal): Secondary | ICD-10-CM | POA: Diagnosis not present

## 2019-08-13 DIAGNOSIS — I350 Nonrheumatic aortic (valve) stenosis: Secondary | ICD-10-CM | POA: Insufficient documentation

## 2019-08-13 DIAGNOSIS — Z823 Family history of stroke: Secondary | ICD-10-CM | POA: Insufficient documentation

## 2019-08-13 DIAGNOSIS — J449 Chronic obstructive pulmonary disease, unspecified: Secondary | ICD-10-CM | POA: Insufficient documentation

## 2019-08-13 DIAGNOSIS — I2510 Atherosclerotic heart disease of native coronary artery without angina pectoris: Secondary | ICD-10-CM | POA: Diagnosis not present

## 2019-08-13 DIAGNOSIS — I272 Pulmonary hypertension, unspecified: Secondary | ICD-10-CM | POA: Diagnosis not present

## 2019-08-13 DIAGNOSIS — E039 Hypothyroidism, unspecified: Secondary | ICD-10-CM | POA: Diagnosis not present

## 2019-08-13 DIAGNOSIS — E785 Hyperlipidemia, unspecified: Secondary | ICD-10-CM | POA: Diagnosis not present

## 2019-08-13 DIAGNOSIS — R05 Cough: Secondary | ICD-10-CM | POA: Insufficient documentation

## 2019-08-13 LAB — BASIC METABOLIC PANEL
Anion gap: 10 (ref 5–15)
BUN: 14 mg/dL (ref 8–23)
CO2: 25 mmol/L (ref 22–32)
Calcium: 8.5 mg/dL — ABNORMAL LOW (ref 8.9–10.3)
Chloride: 102 mmol/L (ref 98–111)
Creatinine, Ser: 1.08 mg/dL (ref 0.61–1.24)
GFR calc Af Amer: >60 mL/min (ref >60)
GFR calc non Af Amer: 58 mL/min — ABNORMAL LOW (ref >60)
Glucose, Bld: 124 mg/dL — ABNORMAL HIGH (ref 70–99)
Potassium: 3.5 mmol/L (ref 3.5–5.1)
Sodium: 137 mmol/L (ref 135–145)

## 2019-08-13 LAB — BRAIN NATRIURETIC PEPTIDE: B Natriuretic Peptide: 247.9 pg/mL — ABNORMAL HIGH (ref 0.0–100.0)

## 2019-08-13 NOTE — Patient Instructions (Addendum)
It was great to see you today! No medication changes are needed at this time.  Labs today We will only contact you if something comes back abnormal or we need to make some changes. Otherwise no news is good news!  Your physician recommends that you schedule a follow-up appointment in: 6 months with Dr Haroldine Laws -please call in February 2021 for an appointment if we have not reached out to you by then  Do the following things EVERYDAY: 1) Weigh yourself in the morning before breakfast. Write it down and keep it in a log. 2) Take your medicines as prescribed 3) Eat low salt foods-Limit salt (sodium) to 2000 mg per day.  4) Stay as active as you can everyday 5) Limit all fluids for the day to less than 2 liters  At the [facility], you and your health needs are our priority. As part of our continuing mission to provide you with exceptional heart care, we have created designated Provider Care Teams. These Care Teams include your primary Cardiologist (physician) and Advanced Practice Providers (APPs- Physician Assistants and Nurse Practitioners) who all work together to provide you with the care you need, when you need it.   You may see any of the following providers on your designated Care Team at your next follow up: Marland Kitchen Dr Glori Bickers . Dr Loralie Champagne . Darrick Grinder, NP . Lyda Jester, PA   Please be sure to bring in all your medications bottles to every appointment.

## 2019-08-13 NOTE — Progress Notes (Signed)
ADVANCED HF CLINIC CONSULT NOTE  Referring Physician: Dr. Gwenlyn Found  Primary Cardiologist: Dr. Gwenlyn Found   HPI:  Mr. Ahn is 85 year old with past history of COPD, hypersomnia with sleep apnea, allergic rhinitis, cough variant asthma. He recently moved from [place] to be close to his family and wants to establish care here. He is chief complaints of dyspnea on exertion. He has history of sleep apnea on BiPAP at settings of 20/6. This is report of CT of the chest from [place] which shows calcified nodule, chronic scarring. 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 He moved to [place] in 1956. Apparently his immigration was delayed due to a lung abnormality on imaging. He was eventually cleared. He does not have any history of TB, exposure to TB. He's had an episode of influenza while in [place].  Hospitalized in July, 2018 for a spontaneous pneumothorax that was treated conservatively. He was again readmitted in August 2018 for lower GI bleed (diverticular). During this admission he was noted to be in acute hypoxic respiratory failure secondary to diastolic heart failure. He improved with diuresis.  He had coronary artery bypass grafting in 1998. He had an endoluminal stent graft for abdominal aortic aneurysm in [place] 10/09 . Pre-op cath showed patent grafts.  Doppler performed 2/18 revealed aortic dimensions of approximately 4.4 x 4.6 cm.   Has been followed by Dr. Vaughan Browner. Smoked a little bit in remote past but quit a long time ago. No h/o connective tissue disease. Wears BIPAP for severe OSA. Has been on home O2 since 2017. Dr. Vaughan Browner has arranged for BIPAP titration and repeat hi-res CT. BiPAP download December 2017-January 2018. AHI 7.2  6MW 02/2018: 820 feet, O2 sats 89-92% on 2L, HR 98-123  Echo 11/19: EF 60-65% RV mild to moderately reduced. RVSP 61mmHG Mild AS  Saw Dr. Gwenlyn Found 2 weeks ago and was found to be in new onset AF. AC not started due to recent diverticular bleed (this is 3rd time)   He returns today for f/u of pulmonary HTN. Remains on sildenafil 40 mg TID. Says he is feeling ok but really hampered by worsening cough with clear sputum. No fevers or chills. No LE edema. No orthopnea or PND. Taking lasix 40 bid. Says he feels ok and able to do ADLs. PCP recently checked hgb and up to 11.8  Studies:  RHC 12/11/17 RA = 6 RV = 72/7 PA = 73/27 (44) PCW = 10 Fick cardiac output/index = 4.1/2.1 PVR = 8.4 WU Ao sat = 91% PA sat = 58%, 58%  Echo 11/18 EF 60-65% grade I DD  RV severely dilated moderate TR RVSP 67mmHG   VQ 12/18 Normal perfusion lung scan.  Irregular peripheral ventilation in both lungs consistent with chronic parenchymal lung disease changes.  PFTs 02/28/16 FVC 3.37 (116%), FEV1 2.08 (108%), F/F 62 Mild obstructive airway disease, no broncho-dilator response  CT scan 07/15/15- 2 .3 x 1.7 cm right upper lobe calcified opacity likely to represent pleural , parenchymal scarring. Mild centrilobular, paraseptal emphysema. Subpleural reticulation, fibrosis representing mild chronic ILD High-resolution CT 09/04/16 - stable right upper lobe scarring, hyper aerated lungs. Minimal interstitial lung disease and peripheral unchanged compared to 2016.  Hi-res CT: 01/06/18: Stable ILD   Review of systems complete and found to be negative unless listed in HPI.   Past Medical History:  Diagnosis Date  . AAA (abdominal aortic aneurysm) ([place])   . Asthma   . Basal cell carcinoma   . COPD (chronic obstructive pulmonary disease) ([place])   . Coronary artery disease   . Hyperlipidemia   . Hypothyroidism   . Skin cancer 2017  . Sleep apnea     Current Outpatient Medications  Medication Sig Dispense Refill  . albuterol (PROVENTIL HFA;VENTOLIN HFA) 108 (90 Base) MCG/ACT inhaler Inhale 1 puff into the lungs 2 (two) times daily. 1 Inhaler 2   . amoxicillin (AMOXIL) 500 MG tablet Take 4 tablets (2,000 mg total) by mouth as needed (prior to dental work). 4 tablet 1  . aspirin (ASPIRIN 81) 81 MG chewable tablet Chew 1 tablet (81 mg total) by mouth daily. Restart in 1 week following hospitalization    . fluticasone (VERAMYST) 27.5 MCG/SPRAY nasal spray Place 1 spray into the nose daily.    . Fluticasone-Salmeterol 113-14 MCG/ACT AEPB INHALE 1 PUFF TWICE A DAY 1 each 5  . furosemide (LASIX) 40 MG tablet Take 40 mg by mouth 2 (two) times daily.    Marland Kitchen levothyroxine (SYNTHROID, LEVOTHROID) 112 MCG tablet Take 112 mcg by mouth daily before breakfast.    . loratadine (CLARITIN) 10 MG tablet Take 10 mg by mouth daily.    . montelukast (SINGULAIR) 10 MG tablet TAKE ONE TABLET BY MOUTH AT BEDTIME 30 tablet 2  . sildenafil (REVATIO) 20 MG tablet TAKE TWO TABLETS BY MOUTH THREE TIMES A DAY 358 tablet 0  . simvastatin (ZOCOR) 10 MG tablet Take 10 mg by mouth at bedtime.  3   No current facility-administered medications for this encounter.     No Known Allergies    Social History   Socioeconomic History  . Marital status: Married    Spouse name: Not on file  . Number of children: Not on file  . Years of education: Not on file  . Highest education level: Not on file  Occupational History  . Not on file  Social Needs  . Financial resource strain: Not on file  . Food insecurity    Worry: Not on file    Inability: Not on file  . Transportation needs    Medical: Not on file    Non-medical: Not on file  Tobacco Use  . Smoking status: Former Smoker    Packs/day: 0.50    Quit date: 08/06/1961    Years since quitting: 58.0  . Smokeless tobacco: Never Used  Substance and Sexual Activity  . Alcohol use: Not Currently  . Drug use: Not on file  . Sexual activity: Not on file  Lifestyle  . Physical activity    Days per week: Not on file    Minutes per session: Not on file  . Stress: Not on file  Relationships  . Social Product manager on phone: Not on file    Gets together: Not on file    Attends religious service: Not on file    Active member of club or organization: Not on file    Attends meetings of clubs or organizations: Not on file    Relationship status: Not on file  . Intimate partner violence    Fear of current or ex partner: Not on file    Emotionally abused: Not on file    Physically abused: Not on file    Forced sexual activity: Not on file  Other Topics Concern  . Not on file  Social History Narrative  .  Not on file      Family History  Problem Relation Age of Onset  . Emphysema Mother   . Stroke Father   . Movement disorder Sister   . Aneurysm Brother        Thoracic aorta  . Macular degeneration Brother     Vitals:   08/13/19 1353  BP: 90/60  Pulse: 71  SpO2: 96%  Weight: 83.5 kg (184 lb)   Wt Readings from Last 3 Encounters:  08/13/19 83.5 kg (184 lb)  08/04/19 80.1 kg (176 lb 9.6 oz)  07/14/19 80 kg (176 lb 5.9 oz)    PHYSICAL EXAM: General:  Elderly No resp difficulty HEENT: normal Neck: supple. JVP 6-7 Carotids 2+ bilat; + bruits. No lymphadenopathy or thryomegaly appreciated. Cor: PMI nondisplaced. Irregular rate & rhythm. 2/6 AS with r educed s2 Lungs: clear with decreased s2 Abdomen: soft, nontender, nondistended. No hepatosplenomegaly. No bruits or masses. Good bowel sounds. Extremities: no cyanosis, clubbing, rash, 1+ ankle edema Neuro: alert & orientedx3, cranial nerves grossly intact. moves all 4 extremities w/o difficulty. Affect pleasant  AF 78 RBBB Personally reviewed  ASSESSMENT & PLAN:  1. Pulmonary hypertension with cor pulmonale - Echo, CT, PFTs and VQ studies reviewed personally - He has evidence of significant pulmonary HTN with severe RV failure. Etiology remains unclear.  - CT with evidence of ILD but PFTs not that bad. VQ negative.  - Suspect PH multifactorial but PAH out of proportion to LV pressures and ILD so suspect he must have component  of Group I disease - RHC 2/19 c/w moderate PAH - Doing well with sildenafil without evidence of shunting. Continue 40 mg tid  Can cut back to 20 tid if needed due to GI side effects - Continue lasix 40 mg bid. BP low. Will check labs today to make sure not overdiuresed.  - Have previously  suggested referral to Pulmonary rehab but he refuses - Continue compression stockings  - We considered adding Opsumit but he could not afford  2. Chronic respiratory failure - evidence of mild ILD on CT. PFTs not too bad - has severe OSA on Bipap. Continue BiPAP qHS - continue supplemental O2 - managed by Pulmonary. No change.   3. CAD s/p CABG - No s/s ischemia Myoview 1/19 normal.  - Managed by Dr. Gwenlyn Found. Ok to cut ASA back to every other day due to severe bruising  4. Aortic stenosis - Mild by echo 11/19. No change. Repeat echo  5. Atrial fibrillation, persistent - Rate controlled. Not candidate for [facility] due to recurrent LGIB  6. Cough - unclear etiology. - offered to repeat RHC but he would like to defer at this time.    Glori Bickers, MD  2:13 PM

## 2019-08-14 ENCOUNTER — Other Ambulatory Visit (HOSPITAL_COMMUNITY): Payer: Self-pay | Admitting: Internal Medicine

## 2019-08-14 ENCOUNTER — Other Ambulatory Visit: Payer: Self-pay | Admitting: Cardiovascular Disease

## 2019-08-14 NOTE — Telephone Encounter (Signed)
Will mail patient paperwork for [employer] in the hopes of getting Opsumit at a lower cost for him.  Will follow up.  Charlann Boxer, CPhT

## 2019-09-02 NOTE — Telephone Encounter (Signed)
Spoke to patient, he states he sent the paperwork through the mail and suspects it should be here in the next day or so. He also informed me that they are mailing the prescription to him and estimated he should get it by the end of the week as well.  Will be on the lookout for the patient paperwork coming from him.  Charlann Boxer, CPhT

## 2019-09-08 ENCOUNTER — Telehealth (HOSPITAL_COMMUNITY): Payer: Self-pay | Admitting: Cardiology

## 2019-09-08 NOTE — Telephone Encounter (Signed)
Received message on triage line with a request to return call regarding medications  Attempted to return call No answer unable to leave message

## 2019-09-09 NOTE — Telephone Encounter (Signed)
Sent documentation to [employer]. Should be everything that is needed. Will check in a few days to be sure.  Charlann Boxer, CPhT

## 2019-09-14 MED ORDER — OPSUMIT 10 MG PO TABS: 10.0000 mg | ORAL_TABLET | Freq: Every day | ORAL | 0 refills | Status: DC

## 2019-09-14 NOTE — Telephone Encounter (Signed)
Patient called to clarify if he should take opsumit and sildenafil together, and if he should continue diuretics at current dose.  Reports pharmacy team was able to gt opsumit for him and has since started. Advised opsumit was added in addition to sildenafil    Also diuretics were ordered for him to continue LASIX 40 mg BID  Pt voiced understanding, nothing further needed at this time.   Note-opsumit is missing from medication list,will add for reconciliation

## 2019-09-22 NOTE — Telephone Encounter (Signed)
[employer], they spoke with the patient and advised him to send in proof of income and he stated that he sent that through [place] on 11/27. They are going to follow up with the patient by the end of the week regarding that paperwork.  Will follow up as need be.  Charlann Boxer, CPhT

## 2019-09-28 ENCOUNTER — Telehealth (HOSPITAL_COMMUNITY): Payer: Self-pay

## 2019-09-28 NOTE — Telephone Encounter (Signed)
Received a message from [place] that patient is currently receiving opsumit through voucher program.  As he is uninsured, they are seeking patient assistance for him which is pending.

## 2019-10-02 NOTE — Telephone Encounter (Signed)
Patient was approved to receive Opsumit through the [employer] PAP.  Effective Dates: 09/30/2019 through 10/22/2019  We should be receiving re-enrollment information for [employer] soon.  Will follow up.  Charlann Boxer, CPhT

## 2019-10-06 ENCOUNTER — Other Ambulatory Visit: Payer: Self-pay | Admitting: Cardiovascular Disease

## 2019-10-19 ENCOUNTER — Telehealth (HOSPITAL_COMMUNITY): Payer: Self-pay | Admitting: *Deleted

## 2019-10-19 NOTE — Telephone Encounter (Signed)
Pt called stating he has been more short of breath w/exertion since starting opsumit a month ago.  Pt stated he is coughing more than usual and more tired.  Pt said all of these symptoms started/worsened when opsumit was started.  Pt wasn't to know if he can stop opsumit and try another medication. Pt advised Dr.Bensimhon is out of the office for the week but I would follow up with his PA.  Routed to [place], [place]

## 2019-10-21 ENCOUNTER — Other Ambulatory Visit (HOSPITAL_COMMUNITY): Payer: Self-pay | Admitting: Cardiology

## 2019-10-21 MED ORDER — SILDENAFIL CITRATE 20 MG PO TABS: 40.0000 mg | ORAL_TABLET | Freq: Three times a day (TID) | ORAL | 3 refills | Status: DC

## 2019-10-21 NOTE — Telephone Encounter (Signed)
Although it appears his cough and DOE are chronic, it is concerning that he believes these symptoms have worsened after starting Opsumit. Since ERAs can cause fluid retention, including peripheral and pulmonary edema, I would recommend assessing his fluid status to see if his furosemide needs to be increased. Recommend checking CMET and BNP. Would also check CBC as ERAs can cause anemia which could be contributing to his fatigue. Additionally, he has a new diagnosis of atrial fibrillation, which was first documented on 08/04/19. Consider repeating EKG to see if this is contributing to symptoms of fatigue. After workup, if it appears the Opsumit is indeed what is causing the worsening symptoms, could consider discontinuing or holding medication.

## 2019-10-22 NOTE — Telephone Encounter (Signed)
Called pt to schedule nurse visit to check cmet,bnp, cbc, and also get EKG as recommended by Lauren no answer/left VM for to call back.

## 2019-10-29 ENCOUNTER — Telehealth (HOSPITAL_COMMUNITY): Payer: Self-pay | Admitting: Licensed Clinical Social Worker

## 2019-10-29 NOTE — Telephone Encounter (Signed)
Notification received that pt has been approved to receive Opsumit through [place] patient assistance  Approval dates: 10/28/19-10/21/20  CSW called pt to inform- pt is already familiar with how to order medications as he was previously enrolled.  CSW will continue to follow and assist as needed  Jorge Ny, [facility] Desk#: [phone] Cell#: [phone]

## 2019-11-02 NOTE — Telephone Encounter (Signed)
Pt called back stating it was not his medication causing the problem pt has bronchitis and is following up with his PCP.

## 2019-11-12 ENCOUNTER — Telehealth: Payer: Self-pay | Admitting: Pulmonary Disease

## 2019-11-12 NOTE — Telephone Encounter (Signed)
Spoke with pt. He is aware of Dr. Matilde Bash response. There aren't any available appointments to be tested at [facility] until 11/17/19. Pt is not willing to go to [place] to be tested and [place] would be out of his way.  Dr. Vaughan Browner - please advise. Thanks.

## 2019-11-12 NOTE — Telephone Encounter (Signed)
Called and spoke to pt. Pt c/o increase in SOB, prod cough with green mucus x >1 week. Pt states she was seen by his PCP last week and was prescribed 7 days of Doxycycline which did not help pt. Pt also states he is having diarrhea that started 3 weeks ago. Pt states he thinks this might be related to Opsumit which he has recently started. Pt denies known COVID contact, CP/tightness, and f/c/s.  Offered pt a video visit and a telephone visit with an APP, pt refused and states he only wants an in office visit with Dr. Vaughan Browner. Preemptively schedule pt an appt with Dr. Vaughan Browner on 1/25 at 1045. Pt aware we need to clear the in-person visit first with Dr. Vaughan Browner since pt is symptomatic. Pt aware that if any s/s worsen or any new CP/tightness, swelling, orthopnea, lethargy, fever to seek emergency care. Pt verbalized understanding.    Dr. Vaughan Browner, please advise. Thanks.

## 2019-11-12 NOTE — Telephone Encounter (Signed)
Ok to keep the appointment. I would like for him to get tested for COVID today or tomorrow as it can present as diarrhea

## 2019-11-13 NOTE — Telephone Encounter (Signed)
Called spoke with patient and discussed Dr Matilde Bash authorization to keep his face-2-face appt on 1.25.21  Patient did state that he forgot to mention that he saw his PCP approx 2 weeks ago and had a negative COVID test at that time.  PCP is in [place] but there is no documentation supporting this in Epic.  Called Dr Briscoe's office and spoke with [place] who verified that COVID test was negative - this is being faxed to the office.  Will give to [place] once received.  Will sign and forward to [place] to make her aware.

## 2019-11-13 NOTE — Telephone Encounter (Signed)
That is fine to keep the appointment in clinic as it is.

## 2019-11-16 ENCOUNTER — Encounter: Payer: Self-pay | Admitting: Pulmonary Disease

## 2019-11-16 ENCOUNTER — Other Ambulatory Visit: Payer: Self-pay

## 2019-11-16 ENCOUNTER — Ambulatory Visit (INDEPENDENT_AMBULATORY_CARE_PROVIDER_SITE_OTHER): Payer: Medicare Other | Admitting: Pulmonary Disease

## 2019-11-16 ENCOUNTER — Ambulatory Visit (INDEPENDENT_AMBULATORY_CARE_PROVIDER_SITE_OTHER): Payer: Medicare Other

## 2019-11-16 VITALS — BP 114/56 | HR 80 | Temp 97.7°F | Ht 68.0 in | Wt 177.8 lb

## 2019-11-16 DIAGNOSIS — J849 Interstitial pulmonary disease, unspecified: Secondary | ICD-10-CM

## 2019-11-16 MED ORDER — PREDNISONE 20 MG PO TABS: 40.0000 mg | ORAL_TABLET | Freq: Every day | ORAL | 0 refills | Status: DC

## 2019-11-16 MED ORDER — AZITHROMYCIN 250 MG PO TABS: ORAL_TABLET | ORAL | 0 refills | Status: DC

## 2019-11-16 NOTE — Patient Instructions (Signed)
Increase your fluticasone salmeterol inhaler to 1 puff twice daily We will get a chest x-ray today Prescribe Z-Pak, prednisone 40 mg a day for 5 days Continue using albuterol inhaler as needed  Follow-up in 1 to 2 months.

## 2019-11-16 NOTE — Progress Notes (Signed)
Titus Rippel    NQ:[id number]    May 29, 1923  Primary Care Physician:Briscoe, Jannifer Rodney, MD  Referring Physician: Katherina Mires, MD [place] [address],  [place] [address]  Chief complaint:   Follow up for COPD Mild ILD, post inflammatory scarring Spontaneous pneumothorax July 2018 > resolved  HPI: [name] is 86 year old with past history of COPD, hypersomnia with sleep apnea, allergic rhinitis, cough variant asthma. He recently moved from [place] to be close to his family and wants to establish care here. He is chief complaints of dyspnea on exertion. He has history of sleep apnea on BiPAP at settings of 20/6. This is report of CT of the chest from [place] which shows calcified nodule, chronic scarring. 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 He moved to [place] in 1956. Apparently his immigration was delayed due to a lung abnormality on imaging. He was eventually cleared. He does not have any history of TB, exposure to TB. He's had an episode of influenza while in [place].  Hospitalized in July, 2018 for a spontaneous pneumothorax that was treated conservatively. He was again readmitted in August 2018 for lower GI bleed. During this admission he was noted to be in acute hypoxic respiratory failure secondary to diastolic heart failure. He improved with diuresis. Follows with Dr. Alvester Chou and Dr. Haroldine Laws for heart failure, pulmonary hypertension.  He is on sildenafil  Interim history: Complains of increasing dyspnea, cough with green phlegm for over a month.  Symptoms started in late December He saw his primary care 2 weeks ago and given doxycycline with no improvement in symptoms.  He is also now on OPSUMIT in addition to sildenafil.  Has intermittent mucousy diarrhea which he attributes to the OPSUMIT.  Outpatient Encounter  Medications as of 11/16/2019  Medication Sig  . albuterol (PROVENTIL HFA;VENTOLIN HFA) 108 (90 Base) MCG/ACT inhaler Inhale 1 puff into the lungs 2 (two) times daily.  Marland Kitchen amoxicillin (AMOXIL) 500 MG tablet Take 4 tablets (2,000 mg total) by mouth as needed (prior to dental work).  Marland Kitchen aspirin (ASPIRIN 81) 81 MG chewable tablet Chew 1 tablet (81 mg total) by mouth daily. Restart in 1 week following hospitalization  . fluticasone (VERAMYST) 27.5 MCG/SPRAY nasal spray Place 1 spray into the nose daily.  . Fluticasone-Salmeterol 113-14 MCG/ACT AEPB INHALE 1 PUFF TWICE A DAY  . furosemide (LASIX) 40 MG tablet TAKE ONE TABLET BY MOUTH DAILY (Patient taking differently: Take 40 mg by mouth 2 (two) times daily. )  . levothyroxine (SYNTHROID, LEVOTHROID) 112 MCG tablet Take 112 mcg by mouth daily before breakfast.  . loratadine (CLARITIN) 10 MG tablet Take 10 mg by mouth daily.  . macitentan (OPSUMIT) 10 MG tablet Take 1 tablet (10 mg total) by mouth daily.  . montelukast (SINGULAIR) 10 MG tablet TAKE ONE TABLET BY MOUTH AT BEDTIME  . sildenafil (REVATIO) 20 MG tablet Take 2 tablets (40 mg total) by mouth 3 (three) times daily.  . simvastatin (ZOCOR) 10 MG tablet Take 10 mg by mouth at bedtime.   No facility-administered encounter medications on file as of 11/16/2019.   Physical Exam: Blood pressure (!) 114/56, pulse 80, temperature 97.7 F (36.5 C), temperature source Temporal, height 5\' 8"  (1.727 m), weight 177 lb 12.8 oz (80.6 kg), SpO2 94 %. Gen:  No acute distress HEENT:  EOMI, sclera anicteric Neck:     No masses; no thyromegaly Lungs:    Scattered bilateral basal crackles CV:         Regular rate and rhythm; no murmurs Abd:      + bowel sounds; soft, non-tender; no palpable masses, no distension Ext:    No edema; adequate peripheral perfusion Skin:      Warm and dry; no rash Neuro: alert and oriented x 3 Psych: normal mood and affect  Data Reviewed: PFTs 02/28/16 FVC 3.37 (116%), FEV1  2.08 (108%), F/F 62 Mild obstructive airway disease, no broncho-dilator response  CT scan 07/15/15- 2 .3 x 1.7 cm right upper lobe calcified opacity likely to represent pleural , parenchymal scarring. Mild centrilobular, paraseptal emphysema. Subpleural reticulation, fibrosis representing mild chronic ILD High-resolution CT 09/04/16 - stable right upper lobe scarring, hyper aerated lungs. Minimal interstitial lung disease and peripheral unchanged compared to 2016. Chest x-ray 05/20/17 - moderate right apical pneumothorax Chest x-ray 06/04/17- resolution of right apical pneumothorax. Chronic interstitial opacities High-resolution CT 01/06/18-interstitial lung disease.  Unchanged compared to 2017.  Stable mediastinal adenopathy and ascending thoracic aneurysm. I reviewed the images personally.  BiPAP download December 2017-January 2018 Time spent greater than 4 hours- 97% AHI 7.2  Assessment:  COPD, asthmatic bronchitis. Seen for exacerbation today. Continue airduo.  He is using it only once daily.  Will increase to twice daily Treat with Z-Pak, prednisone for 5 days Chest x-ray Continue supplemental oxygen.  Mild ILD, postinflammatory lung fibrosis Previous CTs have apparently shown a calcified RUL scarring and mild ILD. The repeat high res resolution CT shows stability of findings from 2016 - 2019. I believe this may be from prior infections and exposures during his time in [place] and during world war.  He will not need to repeat CTs.  This can be followed up on chest x-rays.  OSA Continues on BiPAP with good compliance.  Heart failure, pulmonary hypertension Follow with cardiology and advanced heart failure service  On sildenafil, opsumit  Health maintenance 08/05/2017- Prevnar 13 10/22/2002- Pneumovax  Plan/Recommendations: - Continue airduo, O2, bipap - CXR, Z pack, prednisone  Marshell Garfinkel MD [facility] 11/16/2019, 10:56 AM  CC: Katherina Mires,  MD

## 2019-11-17 ENCOUNTER — Telehealth: Payer: Self-pay

## 2019-11-17 DIAGNOSIS — J849 Interstitial pulmonary disease, unspecified: Secondary | ICD-10-CM

## 2019-11-17 NOTE — Telephone Encounter (Signed)
I called and spoke with the patient and made him aware. He verbalized understanding. I have placed an order for the High res CT.

## 2019-11-17 NOTE — Telephone Encounter (Signed)
-----   Message from Marshell Garfinkel, MD sent at 11/17/2019  8:20 AM EST ----- Please let patient know chest x-ray does not show any pneumonia.  Slightly increased interstitial lung disease that needs further evaluation.Order high-resolution CT.

## 2019-11-22 ENCOUNTER — Emergency Department (HOSPITAL_COMMUNITY): Payer: Medicare Other

## 2019-11-22 ENCOUNTER — Inpatient Hospital Stay (HOSPITAL_COMMUNITY)
Admission: EM | Admit: 2019-11-22 | Discharge: 2019-11-25 | DRG: 291 | Disposition: A | Discharge status: Home / Self Care | Payer: Medicare Other | Attending: Internal Medicine | Admitting: Internal Medicine

## 2019-11-22 ENCOUNTER — Other Ambulatory Visit: Payer: Self-pay

## 2019-11-22 ENCOUNTER — Encounter (HOSPITAL_COMMUNITY): Payer: Self-pay | Admitting: Radiology

## 2019-11-22 DIAGNOSIS — I5033 Acute on chronic diastolic (congestive) heart failure: Secondary | ICD-10-CM

## 2019-11-22 DIAGNOSIS — E876 Hypokalemia: Secondary | ICD-10-CM | POA: Diagnosis present

## 2019-11-22 DIAGNOSIS — I272 Pulmonary hypertension, unspecified: Secondary | ICD-10-CM | POA: Diagnosis present

## 2019-11-22 DIAGNOSIS — C679 Malignant neoplasm of bladder, unspecified: Secondary | ICD-10-CM | POA: Diagnosis present

## 2019-11-22 DIAGNOSIS — G4733 Obstructive sleep apnea (adult) (pediatric): Secondary | ICD-10-CM | POA: Diagnosis present

## 2019-11-22 DIAGNOSIS — Z7982 Long term (current) use of aspirin: Secondary | ICD-10-CM

## 2019-11-22 DIAGNOSIS — I712 Thoracic aortic aneurysm, without rupture: Secondary | ICD-10-CM | POA: Diagnosis present

## 2019-11-22 DIAGNOSIS — J9610 Chronic respiratory failure, unspecified whether with hypoxia or hypercapnia: Secondary | ICD-10-CM | POA: Diagnosis present

## 2019-11-22 DIAGNOSIS — L8992 Pressure ulcer of unspecified site, stage 2: Secondary | ICD-10-CM | POA: Diagnosis present

## 2019-11-22 DIAGNOSIS — I482 Chronic atrial fibrillation, unspecified: Secondary | ICD-10-CM | POA: Diagnosis present

## 2019-11-22 DIAGNOSIS — R079 Chest pain, unspecified: Secondary | ICD-10-CM | POA: Diagnosis not present

## 2019-11-22 DIAGNOSIS — I480 Paroxysmal atrial fibrillation: Secondary | ICD-10-CM | POA: Diagnosis present

## 2019-11-22 DIAGNOSIS — Z87891 Personal history of nicotine dependence: Secondary | ICD-10-CM

## 2019-11-22 DIAGNOSIS — Z951 Presence of aortocoronary bypass graft: Secondary | ICD-10-CM

## 2019-11-22 DIAGNOSIS — J449 Chronic obstructive pulmonary disease, unspecified: Secondary | ICD-10-CM | POA: Diagnosis present

## 2019-11-22 DIAGNOSIS — I959 Hypotension, unspecified: Secondary | ICD-10-CM | POA: Diagnosis not present

## 2019-11-22 DIAGNOSIS — K922 Gastrointestinal hemorrhage, unspecified: Secondary | ICD-10-CM | POA: Diagnosis present

## 2019-11-22 DIAGNOSIS — Z825 Family history of asthma and other chronic lower respiratory diseases: Secondary | ICD-10-CM

## 2019-11-22 DIAGNOSIS — Z20822 Contact with and (suspected) exposure to covid-19: Secondary | ICD-10-CM | POA: Diagnosis present

## 2019-11-22 DIAGNOSIS — Z79890 Hormone replacement therapy (postmenopausal): Secondary | ICD-10-CM

## 2019-11-22 DIAGNOSIS — I714 Abdominal aortic aneurysm, without rupture, unspecified: Secondary | ICD-10-CM | POA: Diagnosis present

## 2019-11-22 DIAGNOSIS — I2510 Atherosclerotic heart disease of native coronary artery without angina pectoris: Secondary | ICD-10-CM | POA: Diagnosis present

## 2019-11-22 DIAGNOSIS — J849 Interstitial pulmonary disease, unspecified: Secondary | ICD-10-CM | POA: Diagnosis present

## 2019-11-22 DIAGNOSIS — G4730 Sleep apnea, unspecified: Secondary | ICD-10-CM | POA: Diagnosis present

## 2019-11-22 DIAGNOSIS — Z85828 Personal history of other malignant neoplasm of skin: Secondary | ICD-10-CM

## 2019-11-22 DIAGNOSIS — I2729 Other secondary pulmonary hypertension: Secondary | ICD-10-CM | POA: Diagnosis present

## 2019-11-22 DIAGNOSIS — Z7952 Long term (current) use of systemic steroids: Secondary | ICD-10-CM

## 2019-11-22 DIAGNOSIS — Z79899 Other long term (current) drug therapy: Secondary | ICD-10-CM

## 2019-11-22 DIAGNOSIS — L8990 Pressure ulcer of unspecified site, unspecified stage: Secondary | ICD-10-CM | POA: Insufficient documentation

## 2019-11-22 DIAGNOSIS — E039 Hypothyroidism, unspecified: Secondary | ICD-10-CM | POA: Diagnosis present

## 2019-11-22 DIAGNOSIS — J9621 Acute and chronic respiratory failure with hypoxia: Secondary | ICD-10-CM | POA: Diagnosis present

## 2019-11-22 DIAGNOSIS — Z955 Presence of coronary angioplasty implant and graft: Secondary | ICD-10-CM

## 2019-11-22 DIAGNOSIS — E785 Hyperlipidemia, unspecified: Secondary | ICD-10-CM | POA: Diagnosis present

## 2019-11-22 DIAGNOSIS — J9620 Acute and chronic respiratory failure, unspecified whether with hypoxia or hypercapnia: Secondary | ICD-10-CM | POA: Diagnosis present

## 2019-11-22 DIAGNOSIS — I350 Nonrheumatic aortic (valve) stenosis: Secondary | ICD-10-CM | POA: Diagnosis present

## 2019-11-22 HISTORY — DX: Personal history of other diseases of the respiratory system: Z87.09

## 2019-11-22 HISTORY — DX: Chronic respiratory failure, unspecified whether with hypoxia or hypercapnia: J96.10

## 2019-11-22 HISTORY — DX: Right heart failure, unspecified: I50.810

## 2019-11-22 LAB — BASIC METABOLIC PANEL
Anion gap: 11 (ref 5–15)
BUN: 26 mg/dL — ABNORMAL HIGH (ref 8–23)
CO2: 26 mmol/L (ref 22–32)
Calcium: 8 mg/dL — ABNORMAL LOW (ref 8.9–10.3)
Chloride: 101 mmol/L (ref 98–111)
Creatinine, Ser: 1.08 mg/dL (ref 0.61–1.24)
GFR calc Af Amer: >60 mL/min (ref >60)
GFR calc non Af Amer: 58 mL/min — ABNORMAL LOW (ref >60)
Glucose, Bld: 85 mg/dL (ref 70–99)
Potassium: 2.8 mmol/L — ABNORMAL LOW (ref 3.5–5.1)
Sodium: 138 mmol/L (ref 135–145)

## 2019-11-22 LAB — CBC WITH DIFFERENTIAL/PLATELET
Abs Immature Granulocytes: 0.08 10*3/uL — ABNORMAL HIGH (ref 0.00–0.07)
Basophils Absolute: 0 10*3/uL (ref 0.0–0.1)
Basophils Relative: 0 %
Eosinophils Absolute: 0.1 10*3/uL (ref 0.0–0.5)
Eosinophils Relative: 1 %
HCT: 36.6 % — ABNORMAL LOW (ref 39.0–52.0)
Hemoglobin: 11.2 g/dL — ABNORMAL LOW (ref 13.0–17.0)
Immature Granulocytes: 1 %
Lymphocytes Relative: 16 %
Lymphs Abs: 2.5 10*3/uL (ref 0.7–4.0)
MCH: 23.9 pg — ABNORMAL LOW (ref 26.0–34.0)
MCHC: 30.6 g/dL (ref 30.0–36.0)
MCV: 78.2 fL — ABNORMAL LOW (ref 80.0–100.0)
Monocytes Absolute: 1.4 10*3/uL — ABNORMAL HIGH (ref 0.1–1.0)
Monocytes Relative: 8 %
Neutro Abs: 12 10*3/uL — ABNORMAL HIGH (ref 1.7–7.7)
Neutrophils Relative %: 74 %
Platelets: 396 10*3/uL (ref 150–400)
RBC: 4.68 MIL/uL (ref 4.22–5.81)
RDW: 20.8 % — ABNORMAL HIGH (ref 11.5–15.5)
WBC: 16 10*3/uL — ABNORMAL HIGH (ref 4.0–10.5)
nRBC: 0.2 % (ref 0.0–0.2)

## 2019-11-22 LAB — TROPONIN I (HIGH SENSITIVITY)
Troponin I (High Sensitivity): 25 ng/L — ABNORMAL HIGH (ref <18)
Troponin I (High Sensitivity): 30 ng/L — ABNORMAL HIGH (ref <18)

## 2019-11-22 LAB — BRAIN NATRIURETIC PEPTIDE: B Natriuretic Peptide: 474.9 pg/mL — ABNORMAL HIGH (ref 0.0–100.0)

## 2019-11-22 MED ORDER — ONDANSETRON HCL 4 MG/2ML IJ SOLN: 4.0000 mg | Freq: Four times a day (QID) | INTRAMUSCULAR | Status: DC | PRN

## 2019-11-22 MED ORDER — ASPIRIN 81 MG PO CHEW
324.0000 mg | CHEWABLE_TABLET | Freq: Once | ORAL | Status: AC
  Administered 2019-11-22: 324 mg via ORAL
  Filled 2019-11-22: qty 4

## 2019-11-22 MED ORDER — LORATADINE 10 MG PO TABS
10.0000 mg | ORAL_TABLET | Freq: Every day | ORAL | Status: DC
  Administered 2019-11-22 – 2019-11-25 (×4): 10 mg via ORAL
  Filled 2019-11-22 (×4): qty 1

## 2019-11-22 MED ORDER — FLUTICASONE PROPIONATE 50 MCG/ACT NA SUSP
1.0000 | Freq: Every day | NASAL | Status: DC
  Administered 2019-11-23 – 2019-11-25 (×3): 1 via NASAL
  Filled 2019-11-22 (×3): qty 16

## 2019-11-22 MED ORDER — IOHEXOL 300 MG/ML  SOLN
75.0000 mL | Freq: Once | INTRAMUSCULAR | Status: AC | PRN
  Administered 2019-11-22: 19:00:00 75 mL via INTRAVENOUS

## 2019-11-22 MED ORDER — SILDENAFIL CITRATE 20 MG PO TABS
40.0000 mg | ORAL_TABLET | Freq: Three times a day (TID) | ORAL | Status: DC
  Administered 2019-11-23 – 2019-11-25 (×6): 40 mg via ORAL
  Filled 2019-11-22 (×11): qty 2

## 2019-11-22 MED ORDER — MORPHINE SULFATE (PF) 2 MG/ML IV SOLN: 1.0000 mg | INTRAVENOUS | Status: DC | PRN

## 2019-11-22 MED ORDER — MACITENTAN 10 MG PO TABS
10.0000 mg | ORAL_TABLET | Freq: Every day | ORAL | Status: DC
  Administered 2019-11-23 – 2019-11-25 (×3): 10 mg via ORAL
  Filled 2019-11-22 (×4): qty 1

## 2019-11-22 MED ORDER — POTASSIUM CHLORIDE CRYS ER 20 MEQ PO TBCR
40.0000 meq | EXTENDED_RELEASE_TABLET | Freq: Once | ORAL | Status: AC
  Administered 2019-11-22: 20:00:00 40 meq via ORAL
  Filled 2019-11-22: qty 2

## 2019-11-22 MED ORDER — SODIUM CHLORIDE 0.9 % IV SOLN: INTRAVENOUS | Status: DC

## 2019-11-22 MED ORDER — PANTOPRAZOLE SODIUM 40 MG IV SOLR
40.0000 mg | Freq: Two times a day (BID) | INTRAVENOUS | Status: DC
  Administered 2019-11-22 – 2019-11-24 (×4): 40 mg via INTRAVENOUS
  Filled 2019-11-22 (×4): qty 40

## 2019-11-22 MED ORDER — FLUTICASONE-SALMETEROL 113-14 MCG/ACT IN AEPB: 1.0000 | INHALATION_SPRAY | Freq: Two times a day (BID) | RESPIRATORY_TRACT | Status: DC

## 2019-11-22 MED ORDER — POTASSIUM CHLORIDE CRYS ER 20 MEQ PO TBCR
40.0000 meq | EXTENDED_RELEASE_TABLET | Freq: Once | ORAL | Status: AC
  Administered 2019-11-22: 40 meq via ORAL
  Filled 2019-11-22: qty 2

## 2019-11-22 MED ORDER — FENTANYL CITRATE (PF) 100 MCG/2ML IJ SOLN
25.0000 ug | Freq: Once | INTRAMUSCULAR | Status: AC
  Administered 2019-11-22: 19:00:00 25 ug via INTRAVENOUS
  Filled 2019-11-22: qty 2

## 2019-11-22 MED ORDER — MONTELUKAST SODIUM 10 MG PO TABS
10.0000 mg | ORAL_TABLET | Freq: Every day | ORAL | Status: DC
  Administered 2019-11-22 – 2019-11-24 (×3): 10 mg via ORAL
  Filled 2019-11-22 (×4): qty 1

## 2019-11-22 MED ORDER — POTASSIUM CHLORIDE 10 MEQ/100ML IV SOLN
10.0000 meq | INTRAVENOUS | Status: AC
  Administered 2019-11-22 (×2): 10 meq via INTRAVENOUS
  Filled 2019-11-22 (×2): qty 100

## 2019-11-22 MED ORDER — FUROSEMIDE 40 MG PO TABS
40.0000 mg | ORAL_TABLET | Freq: Two times a day (BID) | ORAL | Status: DC
  Administered 2019-11-23 – 2019-11-25 (×5): 40 mg via ORAL
  Filled 2019-11-22 (×2): qty 1
  Filled 2019-11-22: qty 2
  Filled 2019-11-22 (×2): qty 1

## 2019-11-22 MED ORDER — ASPIRIN 81 MG PO CHEW
81.0000 mg | CHEWABLE_TABLET | Freq: Every day | ORAL | Status: DC
  Administered 2019-11-23: 81 mg via ORAL
  Filled 2019-11-22: qty 1

## 2019-11-22 MED ORDER — ACETAMINOPHEN 325 MG PO TABS: 650.0000 mg | ORAL_TABLET | ORAL | Status: DC | PRN

## 2019-11-22 MED ORDER — ALBUTEROL SULFATE HFA 108 (90 BASE) MCG/ACT IN AERS
1.0000 | INHALATION_SPRAY | Freq: Two times a day (BID) | RESPIRATORY_TRACT | Status: DC
  Administered 2019-11-22 – 2019-11-23 (×2): 1 via RESPIRATORY_TRACT
  Filled 2019-11-22 (×2): qty 6.7

## 2019-11-22 MED ORDER — ALUM & MAG HYDROXIDE-SIMETH 200-200-20 MG/5ML PO SUSP
30.0000 mL | Freq: Once | ORAL | Status: AC
  Administered 2019-11-22: 30 mL via ORAL
  Filled 2019-11-22: qty 30

## 2019-11-22 MED ORDER — LEVOTHYROXINE SODIUM 112 MCG PO TABS
112.0000 ug | ORAL_TABLET | Freq: Every day | ORAL | Status: DC
  Administered 2019-11-23 – 2019-11-25 (×3): 112 ug via ORAL
  Filled 2019-11-22 (×3): qty 1

## 2019-11-22 MED ORDER — FLUTICASONE FUROATE 27.5 MCG/SPRAY NA SUSP: 1.0000 | Freq: Every day | NASAL | Status: DC

## 2019-11-22 MED ORDER — MOMETASONE FURO-FORMOTEROL FUM 100-5 MCG/ACT IN AERO
2.0000 | INHALATION_SPRAY | Freq: Two times a day (BID) | RESPIRATORY_TRACT | Status: DC
  Administered 2019-11-23 – 2019-11-25 (×6): 2 via RESPIRATORY_TRACT
  Filled 2019-11-22: qty 8.8

## 2019-11-22 NOTE — ED Notes (Signed)
Pt transported to CT ?

## 2019-11-22 NOTE — H&P (Addendum)
History and Physical    Vincent Robinson [id number] DOB: [birth year] DOA: 11/22/2019  PCP: Katherina Mires, MD    Patient coming from: home    Chief Complaint: chest pain  HPI: Vincent Robinson is a 86 y.o. male with medical history significant of COPD, interstitial lung disease, sleep apnea on BiPAP during the night, coronary artery disease status post CABG, congestive heart failure diastolic dysfunction ejection fraction 65%, severe pulmonary hypertension on Opsumit and Viagra, spontaneous pneumothorax, chronic atrial fibrillation, multiple episodes of GI bleed, thoracic and abdominal aortic aneurysm, came with a chief complaint of chest pain started this morning 7-10 on a scale 1-10 describes more like indigestion in the middle of the chest. He denies any shortness of breath fever chills cough. No radiation of the pain..  ED Course:  In the emergency room He received fentanyl, pain went to 2 from 7 Potassium 2.8 received potassium supplement Electrocardiogram atrial fibrillation, T inversion in anterior leads V1 V3 Troponin 26,30, next pending BNP 474 Coronavirus pending CT chest angio 1. No evidence for an acute pulmonary embolism. The main pulmonary artery remains dilated which can be seen in patients with elevated pulmonary artery pressures. 2. Partially visualized peripancreatic fat stranding raising concern for acute pancreatitis. Correlation with lipase is recommended. 3. There is a 4.5 cm ascending thoracic aortic aneurysm. Ascending thoracic aortic aneurysm. Recommend semi-annual imaging followup by CTA or MRA and referral to cardiothoracic surgery if not already obtained. This recommendation follows 2010 ACCF/AHA/AATS/ACR/ASA/SCA/SCAI/SIR/STS/SVM Guidelines for the Diagnosis and Management of Patients With Thoracic Aortic Disease. Circulation. 2010; [id number]:9224643. Aortic aneurysm NOS (ICD10-I71.9) 4. Again noted are findings of fibrotic interstitial lung disease. 5. Mild  mediastinal adenopathy, likely reactive and stable from prior study. 6. Mild cardiomegaly. There is reflux of contrast in the IVC consistent with underlying cardiac dysfunction. 7. Slightly nodular appearance of the liver which can be seen in patients with cirrhosis   Review of Systems: As per HPI otherwise 10 point review of systems negative.  With the exception of chest pain  Past Medical History:  Diagnosis Date  . AAA (abdominal aortic aneurysm) ([facility])   . Asthma   . Basal cell carcinoma   . COPD (chronic obstructive pulmonary disease) ([facility])   . Coronary artery disease   . Hyperlipidemia   . Hypothyroidism   . Skin cancer 2017  . Sleep apnea     Past Surgical History:  Procedure Laterality Date  . bleeding ulcer  2016  . BYPASS AXILLA/BRACHIAL ARTERY  1998  . colon bleed  2012  . COLONOSCOPY N/A 06/07/2017   Procedure: COLONOSCOPY;  Surgeon: Carol Ada, MD;  Location: [facility];  Service: Endoscopy;  Laterality: N/A;  . DEBRIDMENT OF DECUBITUS ULCER  2002  . RIGHT HEART CATH N/A 12/11/2017   Procedure: RIGHT HEART CATH;  Surgeon: Jolaine Artist, MD;  Location: [facility];  Service: Cardiovascular;  Laterality: N/A;  . THORACIC AORTA STENT  2009     reports that he quit smoking about 58 years ago. He smoked 0.50 packs per day. He has never used smokeless tobacco. He reports previous alcohol use. No history on file for drug.  No Known Allergies  Family History  Problem Relation Age of Onset  . Emphysema Mother   . Stroke Father   . Movement disorder Sister   . Aneurysm Brother        Thoracic aorta  . Macular degeneration Brother      Prior to Admission medications  Medication Sig Start Date End Date Taking? Authorizing Provider  albuterol (PROVENTIL HFA;VENTOLIN HFA) 108 (90 Base) MCG/ACT inhaler Inhale 1 puff into the lungs 2 (two) times daily. 05/20/17  Yes Mannam, Praveen, MD  amoxicillin (AMOXIL) 500 MG tablet Take 4 tablets (2,000 mg  total) by mouth as needed (prior to dental work). Patient taking differently: Take 2,000 mg by mouth daily as needed (prior to dental work).  03/06/18  Yes Bensimhon, Shaune Pascal, MD  aspirin (ASPIRIN 81) 81 MG chewable tablet Chew 1 tablet (81 mg total) by mouth daily. Restart in 1 week following hospitalization Patient taking differently: Chew 81 mg by mouth daily.  07/14/19  Yes [place] Eric J, DO  fluticasone (VERAMYST) 27.5 MCG/SPRAY nasal spray Place 1 spray into the nose daily.   Yes [provider]  Fluticasone-Salmeterol 113-14 MCG/ACT AEPB INHALE 1 PUFF TWICE A DAY Patient taking differently: Take 1 puff by mouth 2 (two) times daily.  03/12/18  Yes Mannam, Praveen, MD  furosemide (LASIX) 40 MG tablet TAKE ONE TABLET BY MOUTH DAILY Patient taking differently: Take 40 mg by mouth 2 (two) times daily.  10/08/19  Yes Lorretta Harp, MD  levothyroxine (SYNTHROID, LEVOTHROID) 112 MCG tablet Take 112 mcg by mouth daily before breakfast.   Yes [provider]  loratadine (CLARITIN) 10 MG tablet Take 10 mg by mouth daily.   Yes [provider]  macitentan (OPSUMIT) 10 MG tablet Take 1 tablet (10 mg total) by mouth daily. 09/14/19  Yes Bensimhon, Shaune Pascal, MD  montelukast (SINGULAIR) 10 MG tablet TAKE ONE TABLET BY MOUTH AT BEDTIME Patient taking differently: Take 10 mg by mouth at bedtime.  03/02/19  Yes Mannam, Praveen, MD  sildenafil (REVATIO) 20 MG tablet Take 2 tablets (40 mg total) by mouth 3 (three) times daily. 10/21/19  Yes Lyda Jester M, PA-C  simvastatin (ZOCOR) 10 MG tablet Take 10 mg by mouth at bedtime. 03/22/17  Yes [provider]  azithromycin (ZITHROMAX Z-PAK) 250 MG tablet Take 2 tabs today, then 1 tab daily until gone. Patient not taking: Reported on 11/22/2019 11/16/19   Marshell Garfinkel, MD  predniSONE (DELTASONE) 20 MG tablet Take 2 tablets (40 mg total) by mouth daily with breakfast. Patient not taking: Reported on 11/22/2019 11/16/19    Marshell Garfinkel, MD    Physical Exam: Vitals:   11/22/19 2030 11/22/19 2036 11/22/19 2129 11/22/19 2130  BP: 112/64  95/68   Pulse: (!) 106  90 86  Resp: (!) 25  (!) 26 (!) 29  Temp:      TempSrc:      SpO2: 91% 97%  93%  Weight:      Height:        Constitutional: NAD, calm, comfortable Vitals:   11/22/19 2030 11/22/19 2036 11/22/19 2129 11/22/19 2130  BP: 112/64  95/68   Pulse: (!) 106  90 86  Resp: (!) 25  (!) 26 (!) 29  Temp:      TempSrc:      SpO2: 91% 97%  93%  Weight:      Height:       Eyes: PERRL, lids and conjunctivae normal ENMT: Mucous membranes are moist. Posterior pharynx clear of any exudate or lesions.Normal dentition.  Neck: normal, supple, no masses, no thyromegaly Respiratory: clear to auscultation bilaterally, no wheezing, no crackles. Normal respiratory effort. No accessory muscle use.  Cardiovascular: Regular rate and rhythm, no murmurs / rubs / gallops. +++ extremity edema. 2+ pedal pulses. No carotid bruits.  Abdomen: no tenderness, no masses palpated. No hepatosplenomegaly. Bowel sounds positive.  Musculoskeletal: no clubbing / cyanosis. No joint deformity upper and lower extremities. Good ROM, no contractures. Normal muscle tone.  Skin: no rashes, lesions, ulcers. No induration Neurologic: CN 2-12 grossly intact. Sensation intact, DTR normal. Strength 5/5 in all 4.  Psychiatric: Normal judgment and insight. Alert and oriented x 3. Normal mood.    Labs on Admission: I have personally reviewed following labs and imaging studies  CBC: Recent Labs  Lab 11/22/19 1626  WBC 16.0*  NEUTROABS 12.0*  HGB 11.2*  HCT 36.6*  MCV 78.2*  PLT [id number]   Basic Metabolic Panel: Recent Labs  Lab 11/22/19 1626  NA 138  K 2.8*  CL 101  CO2 26  GLUCOSE 85  BUN 26*  CREATININE 1.08  CALCIUM 8.0*   GFR: Estimated Creatinine Clearance: 42.2 mL/min (by C-G formula based on SCr of 1.08 mg/dL). Liver Function Tests: No results for input(s): AST, ALT,  ALKPHOS, BILITOT, PROT, ALBUMIN in the last 168 hours. No results for input(s): LIPASE, AMYLASE in the last 168 hours. No results for input(s): AMMONIA in the last 168 hours. Coagulation Profile: No results for input(s): INR, PROTIME in the last 168 hours. Cardiac Enzymes: No results for input(s): CKTOTAL, CKMB, CKMBINDEX, TROPONINI in the last 168 hours. BNP (last 3 results) No results for input(s): PROBNP in the last 8760 hours. HbA1C: No results for input(s): HGBA1C in the last 72 hours. CBG: No results for input(s): GLUCAP in the last 168 hours. Lipid Profile: No results for input(s): CHOL, HDL, LDLCALC, TRIG, CHOLHDL, LDLDIRECT in the last 72 hours. Thyroid Function Tests: No results for input(s): TSH, T4TOTAL, FREET4, T3FREE, THYROIDAB in the last 72 hours. Anemia Panel: No results for input(s): VITAMINB12, FOLATE, FERRITIN, TIBC, IRON, RETICCTPCT in the last 72 hours. Urine analysis: No results found for: COLORURINE, APPEARANCEUR, LABSPEC, [place], GLUCOSEU, HGBUR, BILIRUBINUR, KETONESUR, PROTEINUR, UROBILINOGEN, NITRITE, LEUKOCYTESUR  Radiological Exams on Admission: DG Chest 2 View  Result Date: 11/22/2019 CLINICAL DATA:  Shortness of breath. Left-sided chest pain. Pulmonary fibrosis. EXAM: CHEST - 2 VIEW COMPARISON:  11/16/2019 FINDINGS: Stable borderline cardiomegaly. Prior CABG. Chronic interstitial lung disease is again demonstrated. No evidence of acute infiltrate, pneumothorax, or pleural effusion. IMPRESSION: Chronic interstitial lung disease. No acute findings. Electronically Signed   By: Marlaine Hind M.D.   On: 11/22/2019 17:10   CT Angio Chest PE W and/or Wo Contrast  Result Date: 11/22/2019 CLINICAL DATA:  Shortness of breath. EXAM: CT ANGIOGRAPHY CHEST WITH CONTRAST TECHNIQUE: Multidetector CT imaging of the chest was performed using the standard protocol during bolus administration of intravenous contrast. Multiplanar CT image reconstructions and MIPs were obtained  to evaluate the vascular anatomy. CONTRAST:  19mL OMNIPAQUE IOHEXOL 300 MG/ML  SOLN COMPARISON:  CT dated January 06, 2018. FINDINGS: Cardiovascular: Contrast injection is sufficient to demonstrate satisfactory opacification of the pulmonary arteries to the segmental level. There is no pulmonary embolus. The main pulmonary artery is dilated measuring approximately 3.9 cm. There is no CT evidence of acute right heart strain. Atherosclerotic changes are noted of the thoracic aorta. There is a ascending thoracic aortic aneurysm measuring approximately 4.5 cm. Heart size is borderline enlarged. Advanced coronary artery calcifications are noted. There is reflux of contrast in the IVC consistent with some degree of cardiac dysfunction. Mediastinum/Nodes: --there are prominent mediastinal and hilar lymph nodes. For example there is a precarinal lymph node measuring approximately 1.5 cm (previously measuring approximately the same. --No axillary lymphadenopathy. --  No supraclavicular lymphadenopathy. --Normal thyroid gland. --The esophagus is unremarkable Lungs/Pleura: Again noted are findings compatible with fibrotic interstitial lung disease. There is a partially calcified opacity in the posterior right upper lobe measuring approximately 2.3 cm, stable from prior study (axial series 7, image 20). Upper Abdomen: Findings are suspicious for peripancreatic fat stranding involving the partially visualized pancreas. The liver surface appears nodular. The visualized spleen is unremarkable. Musculoskeletal: No chest wall abnormality. No acute or significant osseous findings. Review of the MIP images confirms the above findings. IMPRESSION: 1. No evidence for an acute pulmonary embolism. The main pulmonary artery remains dilated which can be seen in patients with elevated pulmonary artery pressures. 2. Partially visualized peripancreatic fat stranding raising concern for acute pancreatitis. Correlation with lipase is recommended. 3.  There is a 4.5 cm ascending thoracic aortic aneurysm. Ascending thoracic aortic aneurysm. Recommend semi-annual imaging followup by CTA or MRA and referral to cardiothoracic surgery if not already obtained. This recommendation follows 2010 ACCF/AHA/AATS/ACR/ASA/SCA/SCAI/SIR/STS/SVM Guidelines for the Diagnosis and Management of Patients With Thoracic Aortic Disease. Circulation. 2010; [id number]. Aortic aneurysm NOS (ICD10-I71.9) 4. Again noted are findings of fibrotic interstitial lung disease. 5. Mild mediastinal adenopathy, likely reactive and stable from prior study. 6. Mild cardiomegaly. There is reflux of contrast in the IVC consistent with underlying cardiac dysfunction. 7. Slightly nodular appearance of the liver which can be seen in patients with cirrhosis. Aortic Atherosclerosis (ICD10-I70.0). Electronically Signed   By: Constance Holster M.D.   On: 11/22/2019 19:40    EKG: Independently reviewed.   electrocardiogram atrial fibrillation with T wave inversion in anterior leads V1, V2, V3 no acute ST-T changes   Chest pain Started this morning, 6-7 on scale 1-10, no radiation, burning sensation in the center of the chest like indigestion Patient received fentanyl with improvement of chest pain 2 on a scale 1-10 He is comfortable in no distress He has history of CABG long time ago in [place] He has history of severe episode of GI bleeds the last one 6 months ago with red blood to the rectum, history of diverticulosis and peptic ulcer disease He is on 81 mg of aspirin daily. She has history of atrial fibrillation no indication for conversion The first troponin was 26-second 30 Emergency room physician discussed the case with cardiology Recommend follow enzymes Plan cardiology consult in the morning aspirin echocardiogram  Hypokalemia Add potassium check magnesium patient on diuretics  Leukocytosis Probably secondary to steroids just finished treatment  Congestive heart failure  diastolic dysfunction with right ventricular failure/severe pulmonary hypertension Plan resume Lasix, oxygen if needed, resume home medication for pulmonary hypertension  Chronic atrial fibrillation Rate controlled, aspirin  Severe pulmonary hypertension with chronic respiratory failure and cor pulmonale Plan oxygen Lasix BiPAP during the night  COPD/interstitial pulmonary disease Stable Resume inhaler Just finished treatment for COPD exacerbation with taper prednisone and Zithromax  Sleep apnea BPAP during the night at 20/6  Hypothyroidism Resume Synthroid  Aortic abdominal aneurysm Stable follow with ultrasounds every 6 months  History of multiple episodes of GI bleed Last  6 months ago was severe History of diverticulosis peptic ulcer disease Plan PPI  History of thoracic and aortic abdominal aneurysm Stable Follow-up with cardiovascular surgery  Assessment/Plan Active Problems:   COPD (chronic obstructive pulmonary disease) ([facility])   ILD (interstitial lung disease) ([facility])   Lower GI bleed   Sleep apnea   Hypothyroidism   Hyperlipidemia   Coronary artery disease   AAA (abdominal aortic aneurysm) without rupture ([facility])  Pulmonary hypertension, unspecified ([facility])   S/P CABG (coronary artery bypass graft)   Chest pain      DVT prophylaxis: scd  Code Status: full code  Family Communication: no Disposition Plan: home  Consults called: yes by ED/cardiology Admission status: observation    Linard Millers Saraih Lorton MD [facility]  If 7PM-7AM, please contact night-coverage [email or web address]   11/22/2019, 9:58 PM

## 2019-11-22 NOTE — ED Provider Notes (Signed)
[facility] EMERGENCY DEPARTMENT Provider Note   CSN: [id number] Arrival date & time: 11/22/19  1531     History No chief complaint on file.   Vincent Robinson is a 86 y.o. male.  Extensive past medical history most notable for COPD, mild ILD, CAD s/p CABG, AAA s/p graft in 2009, thoracic aortic aneurysm presented to ER with new onset chest pain, shortness of breath.  Patient followed closely by pulmonology, over the past couple months has had cough, shortness of breath.  Treated with steroids and antibiotics.  Recently finished course of both of these.  Went to bed last night without any symptoms.  This morning woke up noted some chest pain.  Describes as 5 or 6 out of 10, burning sensation in center of chest.  Not associated with exertion, no alleviating or aggravating factors, not worsened with deep breath.  No shortness of breath at rest, has some dyspnea on exertion.  Pain is nonradiating.  HPI     Past Medical History:  Diagnosis Date  . AAA (abdominal aortic aneurysm) ([place])   . Asthma   . Basal cell carcinoma   . COPD (chronic obstructive pulmonary disease) ([place])   . Coronary artery disease   . Hyperlipidemia   . Hypothyroidism   . Skin cancer 2017  . Sleep apnea     Patient Active Problem List   Diagnosis Date Noted  . Atrial fibrillation ([place]) 08/04/2019  . Pulmonary hypertension, unspecified ([place]) 10/11/2017  . Aortic stenosis, moderate 10/11/2017  . Coronary artery disease 08/30/2017  . AAA (abdominal aortic aneurysm) without rupture ([place]) 08/30/2017  . Acute blood loss anemia 06/07/2017  . Hemorrhagic shock ([place]) 06/07/2017  . Lower GI bleed 06/05/2017  . Sleep apnea 06/05/2017  . Hypothyroidism 06/05/2017  . Hyperlipidemia 06/05/2017  . Asthma 06/05/2017  . ILD (interstitial lung disease) ([place]) 05/28/2017  . Pneumothorax on right 05/20/2017  . COPD (chronic obstructive pulmonary disease) ([place]) 05/20/2017  . Pneumothorax 05/20/2017  . Skin  cancer 10/23/2015    Past Surgical History:  Procedure Laterality Date  . bleeding ulcer  2016  . BYPASS AXILLA/BRACHIAL ARTERY  1998  . colon bleed  2012  . COLONOSCOPY N/A 06/07/2017   Procedure: COLONOSCOPY;  Surgeon: Carol Ada, MD;  Location: [facility];  Service: Endoscopy;  Laterality: N/A;  . DEBRIDMENT OF DECUBITUS ULCER  2002  . RIGHT HEART CATH N/A 12/11/2017   Procedure: RIGHT HEART CATH;  Surgeon: Jolaine Artist, MD;  Location: [facility];  Service: Cardiovascular;  Laterality: N/A;  . THORACIC AORTA STENT  2009       Family History  Problem Relation Age of Onset  . Emphysema Mother   . Stroke Father   . Movement disorder Sister   . Aneurysm Brother        Thoracic aorta  . Macular degeneration Brother     Social History   Tobacco Use  . Smoking status: Former Smoker    Packs/day: 0.50    Quit date: 08/06/1961    Years since quitting: 58.3  . Smokeless tobacco: Never Used  Substance Use Topics  . Alcohol use: Not Currently  . Drug use: Not on file    Home Medications Prior to Admission medications   Medication Sig Start Date End Date Taking? Authorizing Provider  albuterol (PROVENTIL HFA;VENTOLIN HFA) 108 (90 Base) MCG/ACT inhaler Inhale 1 puff into the lungs 2 (two) times daily. 05/20/17   Mannam, Hart Robinsons, MD  amoxicillin (AMOXIL) 500 MG  tablet Take 4 tablets (2,000 mg total) by mouth as needed (prior to dental work). 03/06/18   Bensimhon, Shaune Pascal, MD  aspirin (ASPIRIN 81) 81 MG chewable tablet Chew 1 tablet (81 mg total) by mouth daily. Restart in 1 week following hospitalization 07/14/19   [place] Donnamarie Poag, DO  azithromycin (ZITHROMAX Z-PAK) 250 MG tablet Take 2 tabs today, then 1 tab daily until gone. 11/16/19   Mannam, Hart Robinsons, MD  fluticasone (VERAMYST) 27.5 MCG/SPRAY nasal spray Place 1 spray into the nose daily.    [provider]  Fluticasone-Salmeterol 113-14 MCG/ACT AEPB INHALE 1 PUFF TWICE A DAY 03/12/18   Mannam, Praveen,  MD  furosemide (LASIX) 40 MG tablet TAKE ONE TABLET BY MOUTH DAILY Patient taking differently: Take 40 mg by mouth 2 (two) times daily.  10/08/19   Lorretta Harp, MD  levothyroxine (SYNTHROID, LEVOTHROID) 112 MCG tablet Take 112 mcg by mouth daily before breakfast.    [provider]  loratadine (CLARITIN) 10 MG tablet Take 10 mg by mouth daily.    [provider]  macitentan (OPSUMIT) 10 MG tablet Take 1 tablet (10 mg total) by mouth daily. 09/14/19   Bensimhon, Shaune Pascal, MD  montelukast (SINGULAIR) 10 MG tablet TAKE ONE TABLET BY MOUTH AT BEDTIME 03/02/19   Mannam, Praveen, MD  predniSONE (DELTASONE) 20 MG tablet Take 2 tablets (40 mg total) by mouth daily with breakfast. 11/16/19   Mannam, Praveen, MD  sildenafil (REVATIO) 20 MG tablet Take 2 tablets (40 mg total) by mouth 3 (three) times daily. 10/21/19   Lyda Jester M, PA-C  simvastatin (ZOCOR) 10 MG tablet Take 10 mg by mouth at bedtime. 03/22/17   [provider]    Allergies    Patient has no known allergies.  Review of Systems   Review of Systems  Constitutional: Negative for chills and fever.  HENT: Negative for ear pain and sore throat.   Eyes: Negative for pain and visual disturbance.  Respiratory: Positive for shortness of breath. Negative for cough.   Cardiovascular: Positive for chest pain. Negative for palpitations.  Gastrointestinal: Negative for abdominal pain and vomiting.  Genitourinary: Negative for dysuria and hematuria.  Musculoskeletal: Negative for arthralgias and back pain.  Skin: Negative for color change and rash.  Neurological: Negative for seizures and syncope.  All other systems reviewed and are negative.   Physical Exam Updated Vital Signs BP 115/68 (BP Location: Right Arm)   Pulse 82   Temp 98.3 F (36.8 C) (Oral)   Resp (!) 22   Ht 5\' 7"  (1.702 m)   Wt 79.4 kg   SpO2 95%   BMI 27.41 kg/m   Physical Exam Vitals and nursing note reviewed.  Constitutional:        Appearance: He is well-developed.  HENT:     Head: Normocephalic and atraumatic.  Eyes:     Conjunctiva/sclera: Conjunctivae normal.  Cardiovascular:     Rate and Rhythm: Normal rate and regular rhythm.     Heart sounds: No murmur.  Pulmonary:     Effort: Pulmonary effort is normal. No respiratory distress.     Breath sounds: Normal breath sounds.  Abdominal:     Palpations: Abdomen is soft.     Tenderness: There is no abdominal tenderness.  Musculoskeletal:        General: Swelling and tenderness present.     Cervical back: Neck supple.     Comments: Bilateral lower leg pitting edema, equal  Skin:    General:  Skin is warm and dry.     Capillary Refill: Capillary refill takes less than 2 seconds.  Neurological:     General: No focal deficit present.     Mental Status: He is alert.  Psychiatric:        Mood and Affect: Mood normal.        Behavior: Behavior normal.     ED Results / Procedures / Treatments   Labs (all labs ordered are listed, but only abnormal results are displayed) Labs Reviewed - No data to display  EKG EKG Interpretation  Date/Time:  Sunday November 22 2019 15:32:49 EST Ventricular Rate:  87 PR Interval:    QRS Duration: 126 QT Interval:  394 QTC Calculation: 498 R Axis:   77 Text Interpretation: Atrial fibrillation Ventricular premature complex Right bundle branch block Confirmed by Ramandeep Arington ([id number]) on 11/22/2019 3:48:15 PM   Radiology No results found.  Procedures Procedures (including critical care time)  Medications Ordered in ED Medications - No data to display  ED Course  I have reviewed the triage vital signs and the nursing notes.  Pertinent labs & imaging results that were available during my care of the patient were reviewed by me and considered in my medical decision making (see chart for details).  Clinical Course as of Nov 21 2250  Sun Nov 22, 2019  1827 Reviewed labs, will get CTA chest, to further investigate,  second troponin   [RD]    Clinical Course User Index [RD] Cesilia Shinn S, MD   MDM Rules/Calculators/A&P                      86  y/o male with COPD, mild ILD, CAD s/p CABG, AAA s/p graft in 2009, thoracic aortic aneurysm presenting with chest pain.  On exam patient well-appearing, stable vital signs.  EKG without obvious ischemic changes, high-sensitivity troponin x2 mildly elevated but have lower suspicion for ACS.  CTA chest was negative for acute PE.  Given the ongoing chest pain and his prior history of CAD, consult to cardiology to discuss.  Dr. Justice Britain reviewed case, she has lower suspicion but recommended hospitalist admission for further observation, troponin trending, echocardiogram.  Discussed case with hospitalist service, Dr. Criss Alvine will admit.  Final Clinical Impression(s) / ED Diagnoses Final diagnoses:  Chest pain, unspecified type    Rx / DC Orders ED Discharge Orders    None       Lucrezia Starch, MD 11/22/19 [phone]

## 2019-11-22 NOTE — ED Triage Notes (Signed)
Since 0700 c/o L side CP without radiation.  Hx of recent bronchitis and CT scheduled for this upcoming week.  Denies any increased SOB, on oxygen  @ @L /[place].   States cough has improved with Prednisone that ordered.  Alert and oriented in NAD, able to scoot himself to ED stretcher.

## 2019-11-22 NOTE — ED Notes (Signed)
Daughter- [name] [phone]  Please update with changes.

## 2019-11-23 ENCOUNTER — Ambulatory Visit (HOSPITAL_BASED_OUTPATIENT_CLINIC_OR_DEPARTMENT_OTHER): Payer: Medicare Other

## 2019-11-23 ENCOUNTER — Encounter (HOSPITAL_COMMUNITY): Payer: Self-pay | Admitting: Emergency Medicine

## 2019-11-23 DIAGNOSIS — E039 Hypothyroidism, unspecified: Secondary | ICD-10-CM | POA: Diagnosis present

## 2019-11-23 DIAGNOSIS — Z951 Presence of aortocoronary bypass graft: Secondary | ICD-10-CM | POA: Diagnosis not present

## 2019-11-23 DIAGNOSIS — I25119 Atherosclerotic heart disease of native coronary artery with unspecified angina pectoris: Secondary | ICD-10-CM | POA: Diagnosis not present

## 2019-11-23 DIAGNOSIS — Z7952 Long term (current) use of systemic steroids: Secondary | ICD-10-CM | POA: Diagnosis not present

## 2019-11-23 DIAGNOSIS — E876 Hypokalemia: Secondary | ICD-10-CM | POA: Diagnosis present

## 2019-11-23 DIAGNOSIS — J449 Chronic obstructive pulmonary disease, unspecified: Secondary | ICD-10-CM | POA: Diagnosis present

## 2019-11-23 DIAGNOSIS — L8992 Pressure ulcer of unspecified site, stage 2: Secondary | ICD-10-CM | POA: Diagnosis present

## 2019-11-23 DIAGNOSIS — I361 Nonrheumatic tricuspid (valve) insufficiency: Secondary | ICD-10-CM

## 2019-11-23 DIAGNOSIS — I272 Pulmonary hypertension, unspecified: Secondary | ICD-10-CM | POA: Diagnosis not present

## 2019-11-23 DIAGNOSIS — J9621 Acute and chronic respiratory failure with hypoxia: Secondary | ICD-10-CM | POA: Diagnosis present

## 2019-11-23 DIAGNOSIS — Z7982 Long term (current) use of aspirin: Secondary | ICD-10-CM | POA: Diagnosis not present

## 2019-11-23 DIAGNOSIS — I350 Nonrheumatic aortic (valve) stenosis: Secondary | ICD-10-CM | POA: Diagnosis present

## 2019-11-23 DIAGNOSIS — I2510 Atherosclerotic heart disease of native coronary artery without angina pectoris: Secondary | ICD-10-CM | POA: Diagnosis present

## 2019-11-23 DIAGNOSIS — Z79890 Hormone replacement therapy (postmenopausal): Secondary | ICD-10-CM | POA: Diagnosis not present

## 2019-11-23 DIAGNOSIS — R079 Chest pain, unspecified: Secondary | ICD-10-CM | POA: Diagnosis present

## 2019-11-23 DIAGNOSIS — J9600 Acute respiratory failure, unspecified whether with hypoxia or hypercapnia: Secondary | ICD-10-CM

## 2019-11-23 DIAGNOSIS — Z79899 Other long term (current) drug therapy: Secondary | ICD-10-CM | POA: Diagnosis not present

## 2019-11-23 DIAGNOSIS — Z87891 Personal history of nicotine dependence: Secondary | ICD-10-CM | POA: Diagnosis not present

## 2019-11-23 DIAGNOSIS — J9620 Acute and chronic respiratory failure, unspecified whether with hypoxia or hypercapnia: Secondary | ICD-10-CM | POA: Diagnosis present

## 2019-11-23 DIAGNOSIS — I5033 Acute on chronic diastolic (congestive) heart failure: Secondary | ICD-10-CM | POA: Diagnosis present

## 2019-11-23 DIAGNOSIS — Z85828 Personal history of other malignant neoplasm of skin: Secondary | ICD-10-CM | POA: Diagnosis not present

## 2019-11-23 DIAGNOSIS — I712 Thoracic aortic aneurysm, without rupture: Secondary | ICD-10-CM | POA: Diagnosis present

## 2019-11-23 DIAGNOSIS — I2729 Other secondary pulmonary hypertension: Secondary | ICD-10-CM | POA: Diagnosis present

## 2019-11-23 DIAGNOSIS — I50810 Right heart failure, unspecified: Secondary | ICD-10-CM | POA: Insufficient documentation

## 2019-11-23 DIAGNOSIS — Z20822 Contact with and (suspected) exposure to covid-19: Secondary | ICD-10-CM | POA: Diagnosis present

## 2019-11-23 DIAGNOSIS — J849 Interstitial pulmonary disease, unspecified: Secondary | ICD-10-CM | POA: Diagnosis present

## 2019-11-23 DIAGNOSIS — I959 Hypotension, unspecified: Secondary | ICD-10-CM | POA: Diagnosis not present

## 2019-11-23 DIAGNOSIS — C679 Malignant neoplasm of bladder, unspecified: Secondary | ICD-10-CM | POA: Diagnosis present

## 2019-11-23 DIAGNOSIS — E782 Mixed hyperlipidemia: Secondary | ICD-10-CM | POA: Diagnosis not present

## 2019-11-23 DIAGNOSIS — E785 Hyperlipidemia, unspecified: Secondary | ICD-10-CM | POA: Diagnosis present

## 2019-11-23 DIAGNOSIS — G4733 Obstructive sleep apnea (adult) (pediatric): Secondary | ICD-10-CM | POA: Diagnosis present

## 2019-11-23 DIAGNOSIS — I482 Chronic atrial fibrillation, unspecified: Secondary | ICD-10-CM | POA: Diagnosis present

## 2019-11-23 DIAGNOSIS — I480 Paroxysmal atrial fibrillation: Secondary | ICD-10-CM | POA: Diagnosis present

## 2019-11-23 HISTORY — DX: Acute respiratory failure, unspecified whether with hypoxia or hypercapnia: J96.00

## 2019-11-23 LAB — BASIC METABOLIC PANEL
Anion gap: 9 (ref 5–15)
BUN: 21 mg/dL (ref 8–23)
CO2: 24 mmol/L (ref 22–32)
Calcium: 7.9 mg/dL — ABNORMAL LOW (ref 8.9–10.3)
Chloride: 104 mmol/L (ref 98–111)
Creatinine, Ser: 1.07 mg/dL (ref 0.61–1.24)
GFR calc Af Amer: >60 mL/min (ref >60)
GFR calc non Af Amer: 59 mL/min — ABNORMAL LOW (ref >60)
Glucose, Bld: 91 mg/dL (ref 70–99)
Potassium: 4.2 mmol/L (ref 3.5–5.1)
Sodium: 137 mmol/L (ref 135–145)

## 2019-11-23 LAB — MAGNESIUM: Magnesium: 2.8 mg/dL — ABNORMAL HIGH (ref 1.7–2.4)

## 2019-11-23 LAB — ECHOCARDIOGRAM COMPLETE
Height: 67 in
Weight: 2800 oz

## 2019-11-23 LAB — CREATININE, SERUM
Creatinine, Ser: 0.93 mg/dL (ref 0.61–1.24)
GFR calc Af Amer: >60 mL/min (ref >60)
GFR calc non Af Amer: >60 mL/min (ref >60)

## 2019-11-23 LAB — SARS CORONAVIRUS 2 (TAT 6-24 HRS): SARS Coronavirus 2: NEGATIVE

## 2019-11-23 LAB — TROPONIN I (HIGH SENSITIVITY)
Troponin I (High Sensitivity): 35 ng/L — ABNORMAL HIGH (ref <18)
Troponin I (High Sensitivity): 40 ng/L — ABNORMAL HIGH (ref <18)

## 2019-11-23 MED ORDER — ASPIRIN 81 MG PO CHEW
81.0000 mg | CHEWABLE_TABLET | Freq: Every day | ORAL | Status: DC
  Administered 2019-11-24 – 2019-11-25 (×2): 81 mg via ORAL
  Filled 2019-11-23 (×2): qty 1

## 2019-11-23 MED ORDER — ISOSORBIDE MONONITRATE ER 30 MG PO TB24: 15.0000 mg | ORAL_TABLET | Freq: Every day | ORAL | Status: DC

## 2019-11-23 MED ORDER — ALBUTEROL SULFATE (2.5 MG/3ML) 0.083% IN NEBU
2.5000 mg | INHALATION_SOLUTION | Freq: Two times a day (BID) | RESPIRATORY_TRACT | Status: DC
  Administered 2019-11-23 – 2019-11-25 (×4): 2.5 mg via RESPIRATORY_TRACT
  Filled 2019-11-23 (×3): qty 3

## 2019-11-23 MED ORDER — METHYLPREDNISOLONE SODIUM SUCC 40 MG IJ SOLR: 40.0000 mg | Freq: Two times a day (BID) | INTRAMUSCULAR | Status: DC

## 2019-11-23 NOTE — Consult Note (Addendum)
Cardiology Consultation:   Patient ID: Franke Olt MRN: [id number]; DOB: 1923-10-21  Admit date: 11/22/2019 Date of Consult: 11/23/2019  Primary Care Provider: Katherina Mires, MD Primary Cardiologist: Quay Burow, MD / Dr Haroldine Laws Primary Electrophysiologist:  None    Patient Profile:   Lessie Faust is a 86 y.o. male with a hx of right heart failure, COPD, and CAD who is being seen today for the evaluation of chest pain and elevated Troponin at the request of Dr Eliseo Squires..  History of Present Illness:   Mr. Sheetz is a pleasant 86 year old male who is followed by Dr. Gwenlyn Found and Dr. Haroldine Laws.  He has a history of severe pulmonary hypertension, right heart failure, COPD, sleep apnea, CAD s/p remote CABG in 1998, aortic stent graft in 2009, recently diagnosed atrial fibrillation, and history of recurrent GI bleeding (pt is not anticoagulated).    The patient was in the [employer] in [place], he was stationed in [place].  He immigrated to the [place] in the 50s.  He had CABG x5 in 1998.  Office notes indicate that he had patent grafts at catheterization prior to his aortic stent graft placement in October 2009.  He did have a Myoview in February 2019 that was low risk.  The patient was recently diagnosed with atrial fibrillation, he is not on anticoagulation because of recurrent GI bleeding.  The patient also tells me he was recently diagnosed with bladder cancer in the past month.   The patient has severe pulmonary hypertension and right heart failure.  He is followed by Dr. Vaughan Browner.  He is on O2 during the day and BiPap at night.  He has had increasing shortness of breath over the past month.  He saw Dr. Vaughan Browner in the office 11/16/2019 and was placed on antibiotics and steroids.  Yesterday the patient developed midsternal chest discomfort.  His symptoms did not radiate to his jaw or arms.  It was not worse with movement.  He took a Nexium at home and then he took Tums with no relief and came to  the emergency room.  His troponins have been elevated with a troponin of 25, 30, and 40.  Cardiology has been asked to evaluate.  Currently he tells me his symptoms have almost completely abated but he can still tell "it's still in the background".  I asked him if his symptoms were similar to his pre-CABG symptoms and he did not think so.  Heart Pathway Score:     Past Medical History:  Diagnosis Date  . AAA (abdominal aortic aneurysm) ([place])   . Asthma   . Basal cell carcinoma   . COPD (chronic obstructive pulmonary disease) ([place])   . Coronary artery disease   . Hyperlipidemia   . Hypothyroidism   . Skin cancer 2017  . Sleep apnea     Past Surgical History:  Procedure Laterality Date  . bleeding ulcer  2016  . BYPASS AXILLA/BRACHIAL ARTERY  1998  . colon bleed  2012  . COLONOSCOPY N/A 06/07/2017   Procedure: COLONOSCOPY;  Surgeon: Carol Ada, MD;  Location: [facility];  Service: Endoscopy;  Laterality: N/A;  . DEBRIDMENT OF DECUBITUS ULCER  2002  . RIGHT HEART CATH N/A 12/11/2017   Procedure: RIGHT HEART CATH;  Surgeon: Jolaine Artist, MD;  Location: [facility];  Service: Cardiovascular;  Laterality: N/A;  . THORACIC AORTA STENT  2009     Home Medications:  Prior to Admission medications   Medication Sig Start  Date End Date Taking? Authorizing Provider  albuterol (PROVENTIL HFA;VENTOLIN HFA) 108 (90 Base) MCG/ACT inhaler Inhale 1 puff into the lungs 2 (two) times daily. 05/20/17  Yes Mannam, Praveen, MD  amoxicillin (AMOXIL) 500 MG tablet Take 4 tablets (2,000 mg total) by mouth as needed (prior to dental work). Patient taking differently: Take 2,000 mg by mouth daily as needed (prior to dental work).  03/06/18  Yes Bensimhon, Shaune Pascal, MD  aspirin (ASPIRIN 81) 81 MG chewable tablet Chew 1 tablet (81 mg total) by mouth daily. Restart in 1 week following hospitalization Patient taking differently: Chew 81 mg by mouth daily.  07/14/19  Yes [place] Eric J, DO    fluticasone (VERAMYST) 27.5 MCG/SPRAY nasal spray Place 1 spray into the nose daily.   Yes [provider]  Fluticasone-Salmeterol 113-14 MCG/ACT AEPB INHALE 1 PUFF TWICE A DAY Patient taking differently: Take 1 puff by mouth 2 (two) times daily.  03/12/18  Yes Mannam, Praveen, MD  furosemide (LASIX) 40 MG tablet TAKE ONE TABLET BY MOUTH DAILY Patient taking differently: Take 40 mg by mouth 2 (two) times daily.  10/08/19  Yes Lorretta Harp, MD  levothyroxine (SYNTHROID, LEVOTHROID) 112 MCG tablet Take 112 mcg by mouth daily before breakfast.   Yes [provider]  loratadine (CLARITIN) 10 MG tablet Take 10 mg by mouth daily.   Yes [provider]  macitentan (OPSUMIT) 10 MG tablet Take 1 tablet (10 mg total) by mouth daily. 09/14/19  Yes Bensimhon, Shaune Pascal, MD  montelukast (SINGULAIR) 10 MG tablet TAKE ONE TABLET BY MOUTH AT BEDTIME Patient taking differently: Take 10 mg by mouth at bedtime.  03/02/19  Yes Mannam, Praveen, MD  sildenafil (REVATIO) 20 MG tablet Take 2 tablets (40 mg total) by mouth 3 (three) times daily. 10/21/19  Yes Lyda Jester M, PA-C  simvastatin (ZOCOR) 10 MG tablet Take 10 mg by mouth at bedtime. 03/22/17  Yes [provider]  azithromycin (ZITHROMAX Z-PAK) 250 MG tablet Take 2 tabs today, then 1 tab daily until gone. Patient not taking: Reported on 11/22/2019 11/16/19   Marshell Garfinkel, MD  predniSONE (DELTASONE) 20 MG tablet Take 2 tablets (40 mg total) by mouth daily with breakfast. Patient not taking: Reported on 11/22/2019 11/16/19   Marshell Garfinkel, MD    Inpatient Medications: Scheduled Meds: . albuterol  1 puff Inhalation BID  . aspirin  81 mg Oral Daily  . fluticasone  1 spray Each Nare Daily  . furosemide  40 mg Oral BID  . isosorbide mononitrate  15 mg Oral Daily  . levothyroxine  112 mcg Oral QAC breakfast  . loratadine  10 mg Oral Daily  . macitentan  10 mg Oral Daily  . mometasone-formoterol  2 puff Inhalation  BID  . montelukast  10 mg Oral QHS  . pantoprazole (PROTONIX) IV  40 mg Intravenous Q12H  . sildenafil  40 mg Oral TID   Continuous Infusions: . sodium chloride Stopped (11/23/19 0848)   PRN Meds: acetaminophen, morphine injection, ondansetron (ZOFRAN) IV  Allergies:   No Known Allergies  Social History:   Social History   Socioeconomic History  . Marital status: Married    Spouse name: Not on file  . Number of children: Not on file  . Years of education: Not on file  . Highest education level: Not on file  Occupational History  . Not on file  Tobacco Use  . Smoking status: Former Smoker    Packs/day: 0.50  Quit date: 08/06/1961    Years since quitting: 58.3  . Smokeless tobacco: Never Used  Substance and Sexual Activity  . Alcohol use: Not Currently  . Drug use: Not on file  . Sexual activity: Not on file  Other Topics Concern  . Not on file  Social History Narrative  . Not on file   Social Determinants of Health   Financial Resource Strain:   . Difficulty of Paying Living Expenses: Not on file  Food Insecurity:   . Worried About Charity fundraiser in the Last Year: Not on file  . Ran Out of Food in the Last Year: Not on file  Transportation Needs:   . Lack of Transportation (Medical): Not on file  . Lack of Transportation (Non-Medical): Not on file  Physical Activity:   . Days of Exercise per Week: Not on file  . Minutes of Exercise per Session: Not on file  Stress:   . Feeling of Stress : Not on file  Social Connections:   . Frequency of Communication with Friends and Family: Not on file  . Frequency of Social Gatherings with Friends and Family: Not on file  . Attends Religious Services: Not on file  . Active Member of Clubs or Organizations: Not on file  . Attends Archivist Meetings: Not on file  . Marital Status: Not on file  Intimate Partner Violence:   . Fear of Current or Ex-Partner: Not on file  . Emotionally Abused: Not on file    . Physically Abused: Not on file  . Sexually Abused: Not on file    Family History:   Family History  Problem Relation Age of Onset  . Emphysema Mother   . Stroke Father   . Movement disorder Sister   . Aneurysm Brother        Thoracic aorta  . Macular degeneration Brother      ROS:  Please see the history of present illness.  No recent GI bleeding All other ROS reviewed and negative.     Physical Exam/Data:   Vitals:   11/23/19 0700 11/23/19 0826 11/23/19 0832 11/23/19 0900  BP: 100/64 112/64    Pulse: 81 (!) 103    Resp: (!) 25 (!) 37    Temp:   98.7 F (37.1 C)   TempSrc:   Oral   SpO2: 93% (!) 86%  95%  Weight:      Height:       No intake or output data in the 24 hours ending 11/23/19 0957 Last 3 Weights 11/22/2019 11/16/2019 08/13/2019  Weight (lbs) 175 lb 177 lb 12.8 oz 184 lb  Weight (kg) 79.379 kg 80.65 kg 83.462 kg     Body mass index is 27.41 kg/m.  General:  Elderly Caucasian male, on O2, SOB with convertsation  HEENT: normal Neck: no JVD, transmitted murmur Endocrine:  No thryomegaly Vascular:  FA pulses 1+ bilaterally without bruits  Cardiac: irregularly irregular rhythm with 2/6 systolic murmur AOV and LSB, diminished S2 Lungs:  Crackles 1/2 up bilateraly Abd: soft, nontender, no hepatomegaly  Ext: 2+ edema Musculoskeletal:  No deformities, BUE and BLE strength normal and equal Skin: cool and dry  Neuro:  CNs 2-12 intact, no focal abnormalities noted Psych:  Normal affect   EKG:  The EKG was personally reviewed and demonstrates:  NSR, PACs, PVCs, IVCD, RVH Telemetry:  Telemetry was personally reviewed and demonstrates:  NSR, 1st AVB, variable PR interval  Relevant CV Studies: Echo just done-  LVF normal, massive RV  Laboratory Data:  High Sensitivity Troponin:   Recent Labs  Lab 11/22/19 1626 11/22/19 1825 11/22/19 2340  TROPONINIHS 25* 30* 40*     Chemistry Recent Labs  Lab 11/22/19 1626 11/22/19 2340  NA 138  --   K 2.8*  --    CL 101  --   CO2 26  --   GLUCOSE 85  --   BUN 26*  --   CREATININE 1.08 0.93  CALCIUM 8.0*  --   GFRNONAA 58* >60  GFRAA >60 >60  ANIONGAP 11  --     No results for input(s): PROT, ALBUMIN, AST, ALT, ALKPHOS, BILITOT in the last 168 hours. Hematology Recent Labs  Lab 11/22/19 1626  WBC 16.0*  RBC 4.68  HGB 11.2*  HCT 36.6*  MCV 78.2*  MCH 23.9*  MCHC 30.6  RDW 20.8*  PLT 396   BNP Recent Labs  Lab 11/22/19 1626  BNP 474.9*    DDimer No results for input(s): DDIMER in the last 168 hours.   Radiology/Studies:  DG Chest 2 View  Result Date: 11/22/2019 CLINICAL DATA:  Shortness of breath. Left-sided chest pain. Pulmonary fibrosis. EXAM: CHEST - 2 VIEW COMPARISON:  11/16/2019 FINDINGS: Stable borderline cardiomegaly. Prior CABG. Chronic interstitial lung disease is again demonstrated. No evidence of acute infiltrate, pneumothorax, or pleural effusion. IMPRESSION: Chronic interstitial lung disease. No acute findings. Electronically Signed   By: Marlaine Hind M.D.   On: 11/22/2019 17:10   CT Angio Chest PE W and/or Wo Contrast  Result Date: 11/22/2019 CLINICAL DATA:  Shortness of breath. EXAM: CT ANGIOGRAPHY CHEST WITH CONTRAST TECHNIQUE: Multidetector CT imaging of the chest was performed using the standard protocol during bolus administration of intravenous contrast. Multiplanar CT image reconstructions and MIPs were obtained to evaluate the vascular anatomy. CONTRAST:  16mL OMNIPAQUE IOHEXOL 300 MG/ML  SOLN COMPARISON:  CT dated January 06, 2018. FINDINGS: Cardiovascular: Contrast injection is sufficient to demonstrate satisfactory opacification of the pulmonary arteries to the segmental level. There is no pulmonary embolus. The main pulmonary artery is dilated measuring approximately 3.9 cm. There is no CT evidence of acute right heart strain. Atherosclerotic changes are noted of the thoracic aorta. There is a ascending thoracic aortic aneurysm measuring approximately 4.5 cm.  Heart size is borderline enlarged. Advanced coronary artery calcifications are noted. There is reflux of contrast in the IVC consistent with some degree of cardiac dysfunction. Mediastinum/Nodes: --there are prominent mediastinal and hilar lymph nodes. For example there is a precarinal lymph node measuring approximately 1.5 cm (previously measuring approximately the same. --No axillary lymphadenopathy. --No supraclavicular lymphadenopathy. --Normal thyroid gland. --The esophagus is unremarkable Lungs/Pleura: Again noted are findings compatible with fibrotic interstitial lung disease. There is a partially calcified opacity in the posterior right upper lobe measuring approximately 2.3 cm, stable from prior study (axial series 7, image 20). Upper Abdomen: Findings are suspicious for peripancreatic fat stranding involving the partially visualized pancreas. The liver surface appears nodular. The visualized spleen is unremarkable. Musculoskeletal: No chest wall abnormality. No acute or significant osseous findings. Review of the MIP images confirms the above findings. IMPRESSION: 1. No evidence for an acute pulmonary embolism. The main pulmonary artery remains dilated which can be seen in patients with elevated pulmonary artery pressures. 2. Partially visualized peripancreatic fat stranding raising concern for acute pancreatitis. Correlation with lipase is recommended. 3. There is a 4.5 cm ascending thoracic aortic aneurysm. Ascending thoracic aortic aneurysm. Recommend semi-annual imaging followup by CTA or  MRA and referral to cardiothoracic surgery if not already obtained. This recommendation follows 2010 ACCF/AHA/AATS/ACR/ASA/SCA/SCAI/SIR/STS/SVM Guidelines for the Diagnosis and Management of Patients With Thoracic Aortic Disease. Circulation. 2010; [id number]. Aortic aneurysm NOS (ICD10-I71.9) 4. Again noted are findings of fibrotic interstitial lung disease. 5. Mild mediastinal adenopathy, likely reactive and  stable from prior study. 6. Mild cardiomegaly. There is reflux of contrast in the IVC consistent with underlying cardiac dysfunction. 7. Slightly nodular appearance of the liver which can be seen in patients with cirrhosis. Aortic Atherosclerosis (ICD10-I70.0). Electronically Signed   By: Constance Holster M.D.   On: 11/22/2019 19:40    :[id number] TIMI Risk Score for Unstable Angina or Non-ST Elevation MI:   The patient's TIMI risk score is 5, which indicates a 26% risk of all cause mortality, new or recurrent myocardial infarction or need for urgent revascularization in the next 14 days.   Assessment and Plan:   NSTEMI-? Chest pain with elevated Troponin- could be type 2 secondary to respiratory failure.   CABG 1998- Cath 2009 patent grafts, Myoview 2019- low risk  Chronic respiratory failure- Severe pulmonary HTN, OSA, RV failure, ILD, COPD. Recent exacerbation treated with steroids and ABs.   PAF- Recently noted in the office though he appears to be in NSR with PVCs and PACs today.  Not anticoagulated secondary to h/o GI bleeding.   H/O AAA- S/p stent graft in 2009- CT -4.5 cm thoracic aneurysm   Hypokalemia- K+ 2.8 on admission-replacement per primary service  AS- Mild to moderate on echo 2019  Plan: Add daily PPI.  Add low dose nitrate. Doubt invasive work up indicated.     For questions or updates, please contact [place] Please consult [email or web address] for contact info under   Addendum: After review with pharmacy, unable to use Imdur as the patient is on Sildenafil.    Angelena Form, PA-C  11/23/2019 9:57 AM

## 2019-11-23 NOTE — Progress Notes (Signed)
[facility] PROGRESS NOTE  Vincent Robinson JOA:[id number] DOB: November 17, 1922 DOA: 11/22/2019 PCP: Katherina Mires, MD  Assessment/Plan:  #1.  Acute on chronic respiratory failure with hypoxia.  Patient with a history of severe pulmonary hypertension as well as interstitial lung disease, RV failure and COPD with gradual worsening over the last 4 weeks.  He states he was treated for "bronchitis" with antibiotics and steroids and finished therapy the day prior to presentation.  He also reports his baseline oxygen saturation level is about 87% on 2 L of oxygen at home.  He has an increased oxygen demand requiring 4 L to maintain oxygen saturation level of 88%.  Chest x-ray reveals stable borderline cardiomegaly with chronic interstitial lung disease no evidence of infiltrate, pneumothorax or pleural effusion.  CT angio of the chest is negative for pulmonary embolism, partially visualized peripancreatic fat stranding raising concern for acute pancreatitis, 4.5 cm ascending thoracic aortic aneurysm, fibrotic interstitial lung disease.  This morning he continues with increased work of breathing. -Continue home Lasix for now as blood pressure allows -Continue home inhalers -Appreciate cardiology input  #2.  Chest pain.  Patient reports pain mostly resolved.  Has a history of CAD status post stents 2009, severe pulmonary hypertension, right heart failure, CAD status post CABG in 1998.  He reports he was recently diagnosed with atrial fibrillation but not on anticoagulation due to history of GI bleed.  Troponins 25, 30, 40.  Evaluated by cardiology who query NSTEMI type II secondary to respiratory failure.  EKG normal sinus rhythm occasional PVCs. -Appreciate cardiology input -Monitor  #3. Acute on chronic diastolic heart failure. May be related to recent steroids for #1 in setting of pulmonary hypertension with cor pulmonale. BNP slightly elevated.  chart review indicates echo in 2019 with an EF of 60% grade 1  diastolic dysfunction RV severe dysfunction.  Home medications include Lasix.  He states his oxygen saturation levels usually 88% on 2 L at home.  Chest x-ray with chronic interstitial lung disease.  CT angio of the chest as noted above.  2+ lower extremity pitting edema.  Followed by Dr. Haroldine Laws -Continue home Lasix for now as blood pressure will tolerate -Defer any other need for diuresis to cardiology -Daily weights -Monitor intake and output -Continue home meds  #4.  Hypokalemia.  Potassium level 2.8 on admission.  He was provided with potassium supplements p.o. and intravenously.  B met results today pending.  Chart review indicates magnesium level 2.8 -Monitor -Replete as indicated  #5.  Atrial fibrillation.  Patient reports he was recently diagnosed with A. fib.  Not on anticoagulation due to history lower GI bleed.  EKG shows normal sinus rhythm  #6.  Hypothyroidism. -Continue home meds  #7.  History of AAA.  CT reveals 4.5 cm thoracic aneurysm recommending 76-monthsurveillance  Code Status: full Family Communication: patient Disposition Plan: to be determined but likely home   Consultants:  LKerin Ransomcards  Procedures:  echo  Antibiotics:    HPI/Subjective: Sitting up in bed watching TV.  Reports chest pain "mostly gone".  Does report that his current increased work of breathing level is more than his baseline.  Objective: Vitals:   11/23/19 0900 11/23/19 1000  BP: (!) 94/49 (!) 82/47  Pulse: 96 90  Resp: (!) 32 (!) 23  Temp:    SpO2: 95% 96%   No intake or output data in the 24 hours ending 11/23/19 1116 Filed Weights   11/22/19 1536  Weight: 79.4 kg  Exam:   General: Well-nourished slightly pale no acute distress  Cardiovascular: Regular rate and rhythm +murmur 2+bilateral pitting edema LE  Respiratory: Mild to moderate increased work of breathing with conversation.  Breath sounds with fair airflow I hear crackles on the base on the left  otherwise clear no wheezes no rhonchi  Abdomen: Obese soft positive bowel sounds throughout nontender to palpation no guarding or rebounding  Musculoskeletal: Joints without swelling/erythema  Data Reviewed: Basic Metabolic Panel: Recent Labs  Lab 11/22/19 1626 11/22/19 2340  NA 138  --   K 2.8*  --   CL 101  --   CO2 26  --   GLUCOSE 85  --   BUN 26*  --   CREATININE 1.08 0.93  CALCIUM 8.0*  --   MG  --  2.8*   Liver Function Tests: No results for input(s): AST, ALT, ALKPHOS, BILITOT, PROT, ALBUMIN in the last 168 hours. No results for input(s): LIPASE, AMYLASE in the last 168 hours. No results for input(s): AMMONIA in the last 168 hours. CBC: Recent Labs  Lab 11/22/19 1626  WBC 16.0*  NEUTROABS 12.0*  HGB 11.2*  HCT 36.6*  MCV 78.2*  PLT 396   Cardiac Enzymes: No results for input(s): CKTOTAL, CKMB, CKMBINDEX, TROPONINI in the last 168 hours. BNP (last 3 results) Recent Labs    08/13/19 1428 11/22/19 1626  BNP 247.9* 474.9*    ProBNP (last 3 results) No results for input(s): PROBNP in the last 8760 hours.  CBG: No results for input(s): GLUCAP in the last 168 hours.  Recent Results (from the past 240 hour(s))  SARS CORONAVIRUS 2 (TAT 6-24 HRS) Nasopharyngeal Nasopharyngeal Swab     Status: None   Collection Time: 11/22/19  8:08 PM   Specimen: Nasopharyngeal Swab  Result Value Ref Range Status   SARS Coronavirus 2 NEGATIVE NEGATIVE Final    Comment: (NOTE) SARS-CoV-2 target nucleic acids are NOT DETECTED. The SARS-CoV-2 RNA is generally detectable in upper and lower respiratory specimens during the acute phase of infection. Negative results do not preclude SARS-CoV-2 infection, do not rule out co-infections with other pathogens, and should not be used as the sole basis for treatment or other patient management decisions. Negative results must be combined with clinical observations, patient history, and epidemiological information. The  expected result is Negative. Fact Sheet for Patients: SugarRoll.be Fact Sheet for Healthcare Providers: https://www.woods-mathews.com/ This test is not yet approved or cleared by the [place] FDA and  has been authorized for detection and/or diagnosis of SARS-CoV-2 by FDA under an Emergency Use Authorization (EUA). This EUA will remain  in effect (meaning this test can be used) for the duration of the COVID-19 declaration under Section 56 4(b)(1) of the Act, 21 U.S.C. section 360bbb-3(b)(1), unless the authorization is terminated or revoked sooner. Performed at [facility], [place] [address], [place], [place] [address]      Studies: DG Chest 2 View  Result Date: 11/22/2019 CLINICAL DATA:  Shortness of breath. Left-sided chest pain. Pulmonary fibrosis. EXAM: CHEST - 2 VIEW COMPARISON:  11/16/2019 FINDINGS: Stable borderline cardiomegaly. Prior CABG. Chronic interstitial lung disease is again demonstrated. No evidence of acute infiltrate, pneumothorax, or pleural effusion. IMPRESSION: Chronic interstitial lung disease. No acute findings. Electronically Signed   By: Marlaine Hind M.D.   On: 11/22/2019 17:10   CT Angio Chest PE W and/or Wo Contrast  Result Date: 11/22/2019 CLINICAL DATA:  Shortness of breath. EXAM: CT ANGIOGRAPHY CHEST WITH CONTRAST TECHNIQUE: Multidetector  CT imaging of the chest was performed using the standard protocol during bolus administration of intravenous contrast. Multiplanar CT image reconstructions and MIPs were obtained to evaluate the vascular anatomy. CONTRAST:  45m OMNIPAQUE IOHEXOL 300 MG/ML  SOLN COMPARISON:  CT dated January 06, 2018. FINDINGS: Cardiovascular: Contrast injection is sufficient to demonstrate satisfactory opacification of the pulmonary arteries to the segmental level. There is no pulmonary embolus. The main pulmonary artery is dilated measuring approximately 3.9 cm. There is no CT evidence of acute  right heart strain. Atherosclerotic changes are noted of the thoracic aorta. There is a ascending thoracic aortic aneurysm measuring approximately 4.5 cm. Heart size is borderline enlarged. Advanced coronary artery calcifications are noted. There is reflux of contrast in the IVC consistent with some degree of cardiac dysfunction. Mediastinum/Nodes: --there are prominent mediastinal and hilar lymph nodes. For example there is a precarinal lymph node measuring approximately 1.5 cm (previously measuring approximately the same. --No axillary lymphadenopathy. --No supraclavicular lymphadenopathy. --Normal thyroid gland. --The esophagus is unremarkable Lungs/Pleura: Again noted are findings compatible with fibrotic interstitial lung disease. There is a partially calcified opacity in the posterior right upper lobe measuring approximately 2.3 cm, stable from prior study (axial series 7, image 20). Upper Abdomen: Findings are suspicious for peripancreatic fat stranding involving the partially visualized pancreas. The liver surface appears nodular. The visualized spleen is unremarkable. Musculoskeletal: No chest wall abnormality. No acute or significant osseous findings. Review of the MIP images confirms the above findings. IMPRESSION: 1. No evidence for an acute pulmonary embolism. The main pulmonary artery remains dilated which can be seen in patients with elevated pulmonary artery pressures. 2. Partially visualized peripancreatic fat stranding raising concern for acute pancreatitis. Correlation with lipase is recommended. 3. There is a 4.5 cm ascending thoracic aortic aneurysm. Ascending thoracic aortic aneurysm. Recommend semi-annual imaging followup by CTA or MRA and referral to cardiothoracic surgery if not already obtained. This recommendation follows 2010 ACCF/AHA/AATS/ACR/ASA/SCA/SCAI/SIR/STS/SVM Guidelines for the Diagnosis and Management of Patients With Thoracic Aortic Disease. Circulation. 2010; 121:: S287-G811  Aortic aneurysm NOS (ICD10-I71.9) 4. Again noted are findings of fibrotic interstitial lung disease. 5. Mild mediastinal adenopathy, likely reactive and stable from prior study. 6. Mild cardiomegaly. There is reflux of contrast in the IVC consistent with underlying cardiac dysfunction. 7. Slightly nodular appearance of the liver which can be seen in patients with cirrhosis. Aortic Atherosclerosis (ICD10-I70.0). Electronically Signed   By: CConstance HolsterM.D.   On: 11/22/2019 19:40    Scheduled Meds: . albuterol  1 puff Inhalation BID  . aspirin  81 mg Oral Daily  . fluticasone  1 spray Each Nare Daily  . furosemide  40 mg Oral BID  . levothyroxine  112 mcg Oral QAC breakfast  . loratadine  10 mg Oral Daily  . macitentan  10 mg Oral Daily  . mometasone-formoterol  2 puff Inhalation BID  . montelukast  10 mg Oral QHS  . pantoprazole (PROTONIX) IV  40 mg Intravenous Q12H  . sildenafil  40 mg Oral TID   Continuous Infusions: . sodium chloride Stopped (11/23/19 0848)    Principal Problem:   Acute on chronic respiratory failure (HCC) Active Problems:   COPD (chronic obstructive pulmonary disease) (HCC)   [place]   Pulmonary hypertension, unspecified (HCC)   Chest pain   Hypothyroidism   Coronary artery disease   AAA (abdominal aortic aneurysm) without rupture (HCC)   S/P CABG (coronary artery bypass graft)   Lower GI bleed  Sleep apnea   Hyperlipidemia    Time spent: 31 minutes    Radene Gunning NP [facility]  If 7PM-7AM, please contact night-coverage at [email or web address] 11/23/2019, 11:16 AM  LOS: 0 days

## 2019-11-23 NOTE — ED Notes (Signed)
Breakfast ordered 

## 2019-11-23 NOTE — Progress Notes (Signed)
  Echocardiogram 2D Echocardiogram has been performed.  Vincent Robinson A Vincent Robinson 11/23/2019, 9:10 AM

## 2019-11-23 NOTE — Progress Notes (Addendum)
Pt offered BIPAP, but is refusing at this time. Advised pt to notify for RT if he changes his mind. RT Will continue to monitor.

## 2019-11-23 NOTE — ED Notes (Signed)
Lunch Tray Ordered @ 1049. °

## 2019-11-24 ENCOUNTER — Encounter (HOSPITAL_COMMUNITY): Payer: Self-pay | Admitting: Internal Medicine

## 2019-11-24 DIAGNOSIS — J849 Interstitial pulmonary disease, unspecified: Secondary | ICD-10-CM

## 2019-11-24 DIAGNOSIS — J9610 Chronic respiratory failure, unspecified whether with hypoxia or hypercapnia: Secondary | ICD-10-CM | POA: Diagnosis present

## 2019-11-24 DIAGNOSIS — L8990 Pressure ulcer of unspecified site, unspecified stage: Secondary | ICD-10-CM | POA: Insufficient documentation

## 2019-11-24 LAB — CBC
HCT: 31.9 % — ABNORMAL LOW (ref 39.0–52.0)
Hemoglobin: 9.8 g/dL — ABNORMAL LOW (ref 13.0–17.0)
MCH: 23.8 pg — ABNORMAL LOW (ref 26.0–34.0)
MCHC: 30.7 g/dL (ref 30.0–36.0)
MCV: 77.4 fL — ABNORMAL LOW (ref 80.0–100.0)
Platelets: 329 10*3/uL (ref 150–400)
RBC: 4.12 MIL/uL — ABNORMAL LOW (ref 4.22–5.81)
RDW: 20.4 % — ABNORMAL HIGH (ref 11.5–15.5)
WBC: 14.3 10*3/uL — ABNORMAL HIGH (ref 4.0–10.5)
nRBC: 0.1 % (ref 0.0–0.2)

## 2019-11-24 LAB — BASIC METABOLIC PANEL
Anion gap: 8 (ref 5–15)
BUN: 20 mg/dL (ref 8–23)
CO2: 25 mmol/L (ref 22–32)
Calcium: 7.6 mg/dL — ABNORMAL LOW (ref 8.9–10.3)
Chloride: 100 mmol/L (ref 98–111)
Creatinine, Ser: 1.1 mg/dL (ref 0.61–1.24)
GFR calc Af Amer: >60 mL/min (ref >60)
GFR calc non Af Amer: 57 mL/min — ABNORMAL LOW (ref >60)
Glucose, Bld: 87 mg/dL (ref 70–99)
Potassium: 3.8 mmol/L (ref 3.5–5.1)
Sodium: 133 mmol/L — ABNORMAL LOW (ref 135–145)

## 2019-11-24 MED ORDER — MIDODRINE HCL 5 MG PO TABS
5.0000 mg | ORAL_TABLET | Freq: Three times a day (TID) | ORAL | Status: DC
  Administered 2019-11-24 – 2019-11-25 (×3): 5 mg via ORAL
  Filled 2019-11-24 (×3): qty 1

## 2019-11-24 MED ORDER — PANTOPRAZOLE SODIUM 40 MG PO TBEC
40.0000 mg | DELAYED_RELEASE_TABLET | Freq: Two times a day (BID) | ORAL | Status: DC
  Administered 2019-11-24 – 2019-11-25 (×2): 40 mg via ORAL
  Filled 2019-11-24 (×2): qty 1

## 2019-11-24 NOTE — Progress Notes (Signed)
Progress Note    Vincent Robinson  [id number] DOB: 04/17/1923  DOA: 11/22/2019 PCP: Katherina Mires, MD    Brief Narrative:    Medical records reviewed and are as summarized below:  Vincent Robinson is a delightful 86 y.o. male [employer] veteran from [place] with known severe pulmonary hypertension and right heart failure, known CAD with prior CABG, COPD, chronic respiratory failure on 2 L nasal cannula with a baseline oxygen saturation level of 87% presents with worsening shortness of breath and pain.  Quite complicated pulmonary/cardiac patient.  Evaluated by cardiology who has concerns for worsening AS.    Assessment/Plan:   Principal Problem:   Acute on chronic respiratory failure (HCC) Active Problems:   COPD (chronic obstructive pulmonary disease) (HCC)   ILD (interstitial lung disease) ([place])   Pulmonary hypertension, unspecified (HCC)   Chest pain   Hypothyroidism   Coronary artery disease   AAA (abdominal aortic aneurysm) without rupture (HCC)   S/P CABG (coronary artery bypass graft)   Lower GI bleed   Sleep apnea   Hyperlipidemia   Pressure injury of skin  #1.  Acute on chronic respiratory failure with hypoxia.  Patient with a history of severe pulmonary hypertension as well as interstitial lung disease, RV failure and COPD with gradual worsening over the last 4 weeks. Respiratory effort improved at rest but continues with increased oxygen demand and DOE. Evaluated by cardiology who opine per patient report effort at baseline.  -Continue home Lasix for now as blood pressure allows -Continue home inhalers -Appreciate cardiology input -attempt to wean oxygen to baseline 2L keeping oxygen saturation level 87-88%.  #2.  Chest pain. Resolved.  Has a history of CAD status post stents 2009, severe pulmonary hypertension, right heart failure, CAD status post CABG in 1998.  He reports he was recently diagnosed with atrial fibrillation but not on anticoagulation due to history of GI  bleed.  Troponins 25, 30, 40.  Evaluated by cardiology who query NSTEMI type II secondary to respiratory failure.  EKG normal sinus rhythm occasional PVCs. Cardiology opine no further ischemia work up needed and recommended medical management.  -Appreciate cardiology input -Monitor  #3. Acute on chronic diastolic heart failure.   Lower extremity edema improved today. echo yesterday with an EF greater than 75%, LV with hyperdynamic function, mild LVH and grade 1 diastolic dysfunction, RV with severely reduced systolic function. Home medications include Lasix.  Followed by Dr. Haroldine Laws.  -Continue home Lasix for now as blood pressure will tolerate -Daily weights -Monitor intake and output -Continue home meds -consider palliative care consult  #4.  Hypokalemia.  Potassium level 2.8 on admission. Resolved this am.  He was provided with potassium supplements p.o. and intravenously. Magnesium level 2.8 -Monitor -Replete as indicated  #5.  Atrial fibrillation.  Patient reports he was recently diagnosed with A. fib.  Not on anticoagulation due to history lower GI bleed.  EKG shows normal sinus rhythm  #6.  Hypothyroidism. -Continue home meds  #7.  History of AAA.  CT reveals 4.5 cm thoracic aneurysm recommending 16-month surveillance  #8.  Hypotension.  Blood pressure has remained soft throughout his hospitalization. SBP range 85-108.  -monitor    Family Communication/Anticipated D/C date and plan/Code Status   DVT prophylaxis: Lovenox ordered. Code Status: Full Code.  Family Communication: patient Disposition Plan: home    Medical Consultants:    None.   Anti-Infectives:    None  Subjective:   Reports feeling "better". Denies pain/discomfort  Objective:  Vitals:   11/23/19 2330 11/24/19 0500 11/24/19 0800 11/24/19 1000  BP: (!) 91/59 (!) 99/54 (!) 93/51 (!) 96/49  Pulse:  91  79  Resp:  17    Temp:  97.7 F (36.5 C) 98 F (36.7 C) (!) 97.4 F (36.3 C)    TempSrc:  Oral Oral Oral  SpO2:  93% 93% 93%  Weight:  81.8 kg    Height:        Intake/Output Summary (Last 24 hours) at 11/24/2019 1417 Last data filed at 11/24/2019 0510 Gross per 24 hour  Intake 212.5 ml  Output 1600 ml  Net -1387.5 ml   Filed Weights   11/22/19 1536 11/24/19 0500  Weight: 79.4 kg 81.8 kg    Exam: Gen: somewhat pale and frail appearing CV: RRR +murmur trace - 1+ LE edema Resp: no increased work of breathing at rest. Mild increased work of breathing with long conversation BS with good air movement no crackles or wheezes Abd: obese soft +BS non-tender to palpation MS: joints without swelling/erythema  Data Reviewed:   I have personally reviewed following labs and imaging studies:  Labs: Labs show the following:   Basic Metabolic Panel: Recent Labs  Lab 11/22/19 1626 11/22/19 1626 11/22/19 2340 11/23/19 1022 11/24/19 0531  NA 138  --   --  137 133*  K 2.8*   < >  --  4.2 3.8  CL 101  --   --  104 100  CO2 26  --   --  24 25  GLUCOSE 85  --   --  91 87  BUN 26*  --   --  21 20  CREATININE 1.08  --  0.93 1.07 1.10  CALCIUM 8.0*  --   --  7.9* 7.6*  MG  --   --  2.8*  --   --    < > = values in this interval not displayed.   GFR Estimated Creatinine Clearance: 42.1 mL/min (by C-G formula based on SCr of 1.1 mg/dL). Liver Function Tests: No results for input(s): AST, ALT, ALKPHOS, BILITOT, PROT, ALBUMIN in the last 168 hours. No results for input(s): LIPASE, AMYLASE in the last 168 hours. No results for input(s): AMMONIA in the last 168 hours. Coagulation profile No results for input(s): INR, PROTIME in the last 168 hours.  CBC: Recent Labs  Lab 11/22/19 1626 11/24/19 0531  WBC 16.0* 14.3*  NEUTROABS 12.0*  --   HGB 11.2* 9.8*  HCT 36.6* 31.9*  MCV 78.2* 77.4*  PLT 396 329   Cardiac Enzymes: No results for input(s): CKTOTAL, CKMB, CKMBINDEX, TROPONINI in the last 168 hours. BNP (last 3 results) No results for input(s): PROBNP in  the last 8760 hours. CBG: No results for input(s): GLUCAP in the last 168 hours. D-Dimer: No results for input(s): DDIMER in the last 72 hours. Hgb A1c: No results for input(s): HGBA1C in the last 72 hours. Lipid Profile: No results for input(s): CHOL, HDL, LDLCALC, TRIG, CHOLHDL, LDLDIRECT in the last 72 hours. Thyroid function studies: No results for input(s): TSH, T4TOTAL, T3FREE, THYROIDAB in the last 72 hours.  Invalid input(s): FREET3 Anemia work up: No results for input(s): VITAMINB12, FOLATE, FERRITIN, TIBC, IRON, RETICCTPCT in the last 72 hours. Sepsis Labs: Recent Labs  Lab 11/22/19 1626 11/24/19 0531  WBC 16.0* 14.3*    Microbiology Recent Results (from the past 240 hour(s))  SARS CORONAVIRUS 2 (TAT 6-24 HRS) Nasopharyngeal Nasopharyngeal Swab     Status: None  Collection Time: 11/22/19  8:08 PM   Specimen: Nasopharyngeal Swab  Result Value Ref Range Status   SARS Coronavirus 2 NEGATIVE NEGATIVE Final    Comment: (NOTE) SARS-CoV-2 target nucleic acids are NOT DETECTED. The SARS-CoV-2 RNA is generally detectable in upper and lower respiratory specimens during the acute phase of infection. Negative results do not preclude SARS-CoV-2 infection, do not rule out co-infections with other pathogens, and should not be used as the sole basis for treatment or other patient management decisions. Negative results must be combined with clinical observations, patient history, and epidemiological information. The expected result is Negative. Fact Sheet for Patients: SugarRoll.be Fact Sheet for Healthcare Providers: https://www.woods-mathews.com/ This test is not yet approved or cleared by the [place] FDA and  has been authorized for detection and/or diagnosis of SARS-CoV-2 by FDA under an Emergency Use Authorization (EUA). This EUA will remain  in effect (meaning this test can be used) for the duration of the COVID-19  declaration under Section 56 4(b)(1) of the Act, 21 U.S.C. section 360bbb-3(b)(1), unless the authorization is terminated or revoked sooner. Performed at [facility], [place] [address] [place], [place] [address]     Procedures and diagnostic studies:  DG Chest 2 View  Result Date: 11/22/2019 CLINICAL DATA:  Shortness of breath. Left-sided chest pain. Pulmonary fibrosis. EXAM: CHEST - 2 VIEW COMPARISON:  11/16/2019 FINDINGS: Stable borderline cardiomegaly. Prior CABG. Chronic interstitial lung disease is again demonstrated. No evidence of acute infiltrate, pneumothorax, or pleural effusion. IMPRESSION: Chronic interstitial lung disease. No acute findings. Electronically Signed   By: Marlaine Hind M.D.   On: 11/22/2019 17:10   CT Angio Chest PE W and/or Wo Contrast  Result Date: 11/22/2019 CLINICAL DATA:  Shortness of breath. EXAM: CT ANGIOGRAPHY CHEST WITH CONTRAST TECHNIQUE: Multidetector CT imaging of the chest was performed using the standard protocol during bolus administration of intravenous contrast. Multiplanar CT image reconstructions and MIPs were obtained to evaluate the vascular anatomy. CONTRAST:  79mL OMNIPAQUE IOHEXOL 300 MG/ML  SOLN COMPARISON:  CT dated January 06, 2018. FINDINGS: Cardiovascular: Contrast injection is sufficient to demonstrate satisfactory opacification of the pulmonary arteries to the segmental level. There is no pulmonary embolus. The main pulmonary artery is dilated measuring approximately 3.9 cm. There is no CT evidence of acute right heart strain. Atherosclerotic changes are noted of the thoracic aorta. There is a ascending thoracic aortic aneurysm measuring approximately 4.5 cm. Heart size is borderline enlarged. Advanced coronary artery calcifications are noted. There is reflux of contrast in the IVC consistent with some degree of cardiac dysfunction. Mediastinum/Nodes: --there are prominent mediastinal and hilar lymph nodes. For example there is a precarinal  lymph node measuring approximately 1.5 cm (previously measuring approximately the same. --No axillary lymphadenopathy. --No supraclavicular lymphadenopathy. --Normal thyroid gland. --The esophagus is unremarkable Lungs/Pleura: Again noted are findings compatible with fibrotic interstitial lung disease. There is a partially calcified opacity in the posterior right upper lobe measuring approximately 2.3 cm, stable from prior study (axial series 7, image 20). Upper Abdomen: Findings are suspicious for peripancreatic fat stranding involving the partially visualized pancreas. The liver surface appears nodular. The visualized spleen is unremarkable. Musculoskeletal: No chest wall abnormality. No acute or significant osseous findings. Review of the MIP images confirms the above findings. IMPRESSION: 1. No evidence for an acute pulmonary embolism. The main pulmonary artery remains dilated which can be seen in patients with elevated pulmonary artery pressures. 2. Partially visualized peripancreatic fat stranding raising concern for acute pancreatitis. Correlation  with lipase is recommended. 3. There is a 4.5 cm ascending thoracic aortic aneurysm. Ascending thoracic aortic aneurysm. Recommend semi-annual imaging followup by CTA or MRA and referral to cardiothoracic surgery if not already obtained. This recommendation follows 2010 ACCF/AHA/AATS/ACR/ASA/SCA/SCAI/SIR/STS/SVM Guidelines for the Diagnosis and Management of Patients With Thoracic Aortic Disease. Circulation. 2010; [id number]. Aortic aneurysm NOS (ICD10-I71.9) 4. Again noted are findings of fibrotic interstitial lung disease. 5. Mild mediastinal adenopathy, likely reactive and stable from prior study. 6. Mild cardiomegaly. There is reflux of contrast in the IVC consistent with underlying cardiac dysfunction. 7. Slightly nodular appearance of the liver which can be seen in patients with cirrhosis. Aortic Atherosclerosis (ICD10-I70.0). Electronically Signed   By:  Constance Holster M.D.   On: 11/22/2019 19:40   ECHOCARDIOGRAM COMPLETE  Result Date: 11/23/2019   ECHOCARDIOGRAM REPORT   Patient Name:   ELIC ALBERTI Date of Exam: 11/23/2019 Medical Rec #:  [id number]    Height:       67.0 in Accession #:    [id number]   Weight:       175.0 lb Date of Birth:  Mar 30, 1923     BSA:          1.91 m Patient Age:    25 years     BP:           94/49 mmHg Patient Gender: M            HR:           99 bpm. Exam Location:  Inpatient Procedure: 2D Echo Indications:    Chest Pain 786.50 / R07.9  History:        Patient has prior history of Echocardiogram examinations, most                 recent 09/11/2018. Prior CABG, Pulmonary HTN; Risk Factors:Sleep                 Apnea. AAA.  Sonographer:    Vikki Ports Turrentine Referring Phys: [id number] [place] CRISTESCU  Sonographer Comments: Suboptimal apical window. Image acquisition challenging due to respiratory motion. IMPRESSIONS  1. Left ventricular ejection fraction, by visual estimation, is >75%. The left ventricle has hyperdynamic function. There is mildly increased left ventricular hypertrophy.  2. Left ventricular diastolic parameters are consistent with Grade I diastolic dysfunction (impaired relaxation).  3. The left ventricle has no regional wall motion abnormalities.  4. Global right ventricle has severely reduced systolic function.The right ventricular size is moderately enlarged.  5. Left atrial size was mildly dilated.  6. Right atrial size was moderately dilated.  7. Mild mitral annular calcification.  8. The mitral valve is normal in structure. Trivial mitral valve regurgitation. No evidence of mitral stenosis.  9. The tricuspid valve is normal in structure. Tricuspid valve regurgitation is moderate. 10. The aortic valve is tricuspid. Aortic valve regurgitation is not visualized. Moderate aortic valve stenosis. 11. The pulmonic valve was normal in structure. Pulmonic valve regurgitation is not visualized. 12. Severely elevated  pulmonary artery systolic pressure. 13. The inferior vena cava is dilated in size with >50% respiratory variability, suggesting right atrial pressure of 8 mmHg. 14. Hyperdynamic LV systolic function, grade 1 diastolic dysfunction; mild LVH; aortic valve calcified with moderate AS (mean gradient 24 mmHg); mild LAE; moderate RAE/RVE; severe RV dysfunction; moderate TR with severely elevated pulmonary pressure. FINDINGS  Left Ventricle: Left ventricular ejection fraction, by visual estimation, is >75%. The left ventricle has hyperdynamic function. The left ventricle has no regional  wall motion abnormalities. There is mildly increased left ventricular hypertrophy. Left ventricular diastolic parameters are consistent with Grade I diastolic dysfunction (impaired relaxation). Normal left atrial pressure. Right Ventricle: The right ventricular size is moderately enlarged.Global RV systolic function is has severely reduced systolic function. The tricuspid regurgitant velocity is 4.69 m/s, and with an assumed right atrial pressure of 8 mmHg, the estimated right ventricular systolic pressure is severely elevated at 96.0 mmHg. Left Atrium: Left atrial size was mildly dilated. Right Atrium: Right atrial size was moderately dilated Pericardium: There is no evidence of pericardial effusion. Mitral Valve: The mitral valve is normal in structure. Mild mitral annular calcification. Trivial mitral valve regurgitation. No evidence of mitral valve stenosis by observation. Tricuspid Valve: The tricuspid valve is normal in structure. Tricuspid valve regurgitation is moderate. Aortic Valve: The aortic valve is tricuspid. Aortic valve regurgitation is not visualized. Moderate aortic stenosis is present. Aortic valve mean gradient measures 22.0 mmHg. Aortic valve peak gradient measures 36.2 mmHg. Aortic valve area, by VTI measures 1.28 cm. Pulmonic Valve: The pulmonic valve was normal in structure. Pulmonic valve regurgitation is not  visualized. Pulmonic regurgitation is not visualized. Aorta: The aortic root is normal in size and structure. Venous: The inferior vena cava is dilated in size with greater than 50% respiratory variability, suggesting right atrial pressure of 8 mmHg. IAS/Shunts: No atrial level shunt detected by color flow Doppler. Additional Comments: Hyperdynamic LV systolic function, grade 1 diastolic dysfunction; mild LVH; aortic valve calcified with moderate AS (mean gradient 24 mmHg); mild LAE; moderate RAE/RVE; severe RV dysfunction; moderate TR with severely elevated pulmonary pressure.  LEFT VENTRICLE PLAX 2D LVIDd:         4.40 cm LVIDs:         2.50 cm LV PW:         1.00 cm LV IVS:        1.00 cm LVOT diam:     2.00 cm LV SV:         65 ml LV SV Index:   33.45 LVOT Area:     3.14 cm  RIGHT VENTRICLE RV S prime:     10.90 cm/s LEFT ATRIUM           Index LA diam:      4.80 cm 2.51 cm/m LA Vol (A4C): 68.0 ml 35.59 ml/m  AORTIC VALVE AV Area (Vmax):    1.52 cm AV Area (Vmean):   1.52 cm AV Area (VTI):     1.28 cm AV Vmax:           301.00 cm/s AV Vmean:          217.333 cm/s AV VTI:            0.590 m AV Peak Grad:      36.2 mmHg AV Mean Grad:      22.0 mmHg LVOT Vmax:         146.00 cm/s LVOT Vmean:        105.000 cm/s LVOT VTI:          0.241 m LVOT/AV VTI ratio: 0.41  AORTA Ao Root diam: 3.70 cm TRICUSPID VALVE TR Peak grad:   88.0 mmHg TR Vmax:        469.00 cm/s  SHUNTS Systemic VTI:  0.24 m Systemic Diam: 2.00 cm  Kirk Ruths MD Electronically signed by Kirk Ruths MD Signature Date/Time: 11/23/2019/12:13:49 PM    Final     Medications:   . albuterol  2.5 mg  Nebulization BID  . aspirin  81 mg Oral Daily  . fluticasone  1 spray Each Nare Daily  . furosemide  40 mg Oral BID  . levothyroxine  112 mcg Oral QAC breakfast  . loratadine  10 mg Oral Daily  . macitentan  10 mg Oral Daily  . mometasone-formoterol  2 puff Inhalation BID  . montelukast  10 mg Oral QHS  . pantoprazole  40 mg Oral BID  .  sildenafil  40 mg Oral TID   Continuous Infusions: . sodium chloride Stopped (11/23/19 0848)     LOS: 1 day   Radene Gunning NP [facility]   How to contact the [facility] Attending or Consulting provider [place] or covering provider during after hours [place], for this patient?  1. Check the care team in [facility] and look for a) attending/consulting [facility] provider listed and b) the [facility] team listed 2. Log into [email or web address] and use [place]'s universal password to access. If you do not have the password, please contact the hospital operator. 3. Locate the [facility] provider you are looking for under [facility] and page to a number that you can be directly reached. 4. If you still have difficulty reaching the provider, please page the [facility] (Director on Call) for the Hospitalists listed on amion for assistance.  11/24/2019, 2:17 PM

## 2019-11-24 NOTE — TOC Initial Note (Signed)
Transition of Care [facility]) - Initial/Assessment Note    Patient Details  Name: Vincent Robinson MRN: [id number] Date of Birth: 1923-05-17  Transition of Care [facility]) CM/SW Contact:    Alexander Mt, [place] Phone Number: 11/24/2019, 5:08 PM  Clinical Narrative:                 CSW met with pt at bedside. Pt from home with his wife [name] and a shih tzu. Their daughter lives in [place] and assists if needed. Pt does not have any home health or DME other than his 2L oxygen and CPAP at night. He confirms he would accept a walker to take home as the MD and PT have both recommended; pt prefers a rollator over a walker if he can have one. His daughter will take him home when he's medically ready.   CSW called pt wife with his permission and went over these recommendations. She is worried he wont use the walker; CSW explained I had educated him on importance of using it if recommended. Pt wife confirms daughter will pick up the pt. Per wife pt daughter has a morning appointment tomorrow but can pick up after if pt ready.   Pt wife also with concerns that pt needing 4L oxygen instead of his usual 2. Hopes to speak with MD about this prior to dc.   TOC team remains available as needed. Expected Discharge Plan: Home/Self Care Barriers to Discharge: Continued Medical Work up  Expected Discharge Plan and Services Expected Discharge Plan: Home/Self Care In-house Referral: Clinical Social Work Discharge Planning Services: CM Consult Post Acute Care Choice: Durable Medical Equipment Living arrangements for the past 2 months: Single Family Home                 DME Arranged: Oxygen, Walker rolling with seat DME Agency: [employer] Date DME Agency Contacted: 11/24/19 Time DME Agency Contacted: [phone] Representative spoke with at DME Agency: Bethanne Ginger  Prior Living Arrangements/Services Living arrangements for the past 2 months: [place] with:: Spouse Patient language and need for interpreter  reviewed:: Yes(no needs) Do you feel safe going back to the place where you live?: Yes      Need for Family Participation in Patient Care: Yes (Comment)(assistance with daily cares as needed) Care giver support system in place?: Yes (comment)(spouse)   Criminal Activity/Legal Involvement Pertinent to Current Situation/Hospitalization: No - Comment as needed  Activities of Daily Living Home Assistive Devices/Equipment: Dentures (specify type), Oxygen, CPAP(2L [place]) ADL Screening (condition at time of admission) Patient's cognitive ability adequate to safely complete daily activities?: Yes Is the patient deaf or have difficulty hearing?: Yes Does the patient have difficulty seeing, even when wearing glasses/contacts?: No Does the patient have difficulty concentrating, remembering, or making decisions?: No Patient able to express need for assistance with ADLs?: Yes Does the patient have difficulty dressing or bathing?: No Independently performs ADLs?: Yes (appropriate for developmental age) Does the patient have difficulty walking or climbing stairs?: Yes Weakness of Legs: Both Weakness of Arms/Hands: None  Permission Sought/Granted Permission sought to share information with : Family Supports Permission granted to share information with : Yes, Verbal Permission Granted  Share Information with NAME: Romano Stigger  Permission granted to share info w AGENCY: [place] granted to share info w Relationship: wife  Permission granted to share info w Contact Information: [phone]  Emotional Assessment Appearance:: Appears stated age Attitude/Demeanor/Rapport: Engaged, Gracious Affect (typically observed): Accepting, Adaptable, Pleasant Orientation: :  Oriented to Self, Oriented to Place, Oriented to  Time, Oriented to Situation Alcohol / Substance Use: Not Applicable Psych Involvement: No (comment)  Admission diagnosis:  Chest pain [R07.9] Chest pain, unspecified type  [R07.9] Patient Active Problem List   Diagnosis Date Noted  . Pressure injury of skin 11/24/2019  . Chronic respiratory failure ([place])   . Acute on chronic respiratory failure ([place]) 11/23/2019  . Right heart failure ([place])   . S/P CABG (coronary artery bypass graft) 11/22/2019  . Chest pain 11/22/2019  . Atrial fibrillation ([place]) 08/04/2019  . Pulmonary hypertension, unspecified ([place]) 10/11/2017  . Aortic stenosis, moderate 10/11/2017  . Coronary artery disease 08/30/2017  . AAA (abdominal aortic aneurysm) without rupture ([place]) 08/30/2017  . Acute blood loss anemia 06/07/2017  . Hemorrhagic shock ([place]) 06/07/2017  . Lower GI bleed 06/05/2017  . Sleep apnea 06/05/2017  . Hypothyroidism 06/05/2017  . Hyperlipidemia 06/05/2017  . Asthma 06/05/2017  . ILD (interstitial lung disease) ([place]) 05/28/2017  . Pneumothorax on right 05/20/2017  . COPD (chronic obstructive pulmonary disease) ([place]) 05/20/2017  . Pneumothorax 05/20/2017  . Skin cancer 10/23/2015   PCP:  Katherina Mires, MD Pharmacy:   [facility] # [id number] [place] [address] [place] [address] Phone: [phone] Fax: [phone]  [facility] [address] - [place], [place] - [address] [place] [address] Phone: [phone] Fax: [phone]  [facility] [place] [address], [place] [address] Phone: [phone] Fax: [phone]  [place], [place]. [place]. [place] [address] Phone: [phone] Fax: [phone]  [id number] [place], [place]: [phone]: [phone] [address] [address]: [phone]:  [phone] [id number] [address]: [phone]: [phone]  Readmission Risk Interventions Readmission Risk Prevention Plan 11/24/2019  Transportation Screening Complete  PCP or Specialist Appt within 5-7 Days Patient refused  Home Care Screening Complete  Medication Review (RN CM) Referral to Pharmacy  Some recent data might be hidden

## 2019-11-24 NOTE — Progress Notes (Signed)
Progress Note  Patient Name: Vincent Robinson Date of Encounter: 11/24/2019  Primary Cardiologist: Quay Burow, MD   Subjective   Patient states he is feeling better today. He remains on 4L O2. He put out 1.6 L urine yesterday. Feels the compression stockings have helped.   Inpatient Medications    Scheduled Meds: . albuterol  2.5 mg Nebulization BID  . aspirin  81 mg Oral Daily  . fluticasone  1 spray Each Nare Daily  . furosemide  40 mg Oral BID  . levothyroxine  112 mcg Oral QAC breakfast  . loratadine  10 mg Oral Daily  . macitentan  10 mg Oral Daily  . mometasone-formoterol  2 puff Inhalation BID  . montelukast  10 mg Oral QHS  . pantoprazole (PROTONIX) IV  40 mg Intravenous Q12H  . sildenafil  40 mg Oral TID   Continuous Infusions: . sodium chloride Stopped (11/23/19 0848)   PRN Meds: acetaminophen, morphine injection, ondansetron (ZOFRAN) IV   Vital Signs    Vitals:   11/23/19 2319 11/23/19 2322 11/23/19 2330 11/24/19 0500  BP:   (!) 91/59 (!) 99/54  Pulse: 80 62  91  Resp: 16 17  17   Temp:  98.2 F (36.8 C)  97.7 F (36.5 C)  TempSrc:  Axillary  Oral  SpO2: 92% 94%  93%  Weight:    81.8 kg  Height:        Intake/Output Summary (Last 24 hours) at 11/24/2019 0831 Last data filed at 11/24/2019 0510 Gross per 24 hour  Intake 212.5 ml  Output 1600 ml  Net -1387.5 ml   Last 3 Weights 11/24/2019 11/22/2019 11/16/2019  Weight (lbs) 180 lb 5.4 oz 175 lb 177 lb 12.8 oz  Weight (kg) 81.8 kg 79.379 kg 80.65 kg      Telemetry    NSR rates 70-80s and Afib rates mostly controlled, peak 122 bpm; frequent PVCs - Personally Reviewed  ECG    Pending - Personally Reviewed  Physical Exam   GEN: No acute distress.   Neck: No JVD Cardiac: RRR, no murmurs, rubs, or gallops.  Respiratory: Clear to auscultation bilaterally; 4L O2 GI: Soft, nontender, non-distended  MS: 2+ edema; No deformity. Neuro:  Nonfocal  Psych: Normal affect   Labs    High Sensitivity  Troponin:   Recent Labs  Lab 11/22/19 1626 11/22/19 1825 11/22/19 2340 11/23/19 1022  TROPONINIHS 25* 30* 40* 35*      Chemistry Recent Labs  Lab 11/22/19 1626 11/22/19 1626 11/22/19 2340 11/23/19 1022 11/24/19 0531  NA 138  --   --  137 133*  K 2.8*  --   --  4.2 3.8  CL 101  --   --  104 100  CO2 26  --   --  24 25  GLUCOSE 85  --   --  91 87  BUN 26*  --   --  21 20  CREATININE 1.08   < > 0.93 1.07 1.10  CALCIUM 8.0*  --   --  7.9* 7.6*  GFRNONAA 58*   < > >60 59* 57*  GFRAA >60   < > >60 >60 >60  ANIONGAP 11  --   --  9 8   < > = values in this interval not displayed.     Hematology Recent Labs  Lab 11/22/19 1626 11/24/19 0531  WBC 16.0* 14.3*  RBC 4.68 4.12*  HGB 11.2* 9.8*  HCT 36.6* 31.9*  MCV 78.2* 77.4*  MCH  23.9* 23.8*  MCHC 30.6 30.7  RDW 20.8* 20.4*  PLT 396 329    BNP Recent Labs  Lab 11/22/19 1626  BNP 474.9*     DDimer No results for input(s): DDIMER in the last 168 hours.   Radiology    DG Chest 2 View  Result Date: 11/22/2019 CLINICAL DATA:  Shortness of breath. Left-sided chest pain. Pulmonary fibrosis. EXAM: CHEST - 2 VIEW COMPARISON:  11/16/2019 FINDINGS: Stable borderline cardiomegaly. Prior CABG. Chronic interstitial lung disease is again demonstrated. No evidence of acute infiltrate, pneumothorax, or pleural effusion. IMPRESSION: Chronic interstitial lung disease. No acute findings. Electronically Signed   By: Marlaine Hind M.D.   On: 11/22/2019 17:10   CT Angio Chest PE W and/or Wo Contrast  Result Date: 11/22/2019 CLINICAL DATA:  Shortness of breath. EXAM: CT ANGIOGRAPHY CHEST WITH CONTRAST TECHNIQUE: Multidetector CT imaging of the chest was performed using the standard protocol during bolus administration of intravenous contrast. Multiplanar CT image reconstructions and MIPs were obtained to evaluate the vascular anatomy. CONTRAST:  17mL OMNIPAQUE IOHEXOL 300 MG/ML  SOLN COMPARISON:  CT dated January 06, 2018. FINDINGS:  Cardiovascular: Contrast injection is sufficient to demonstrate satisfactory opacification of the pulmonary arteries to the segmental level. There is no pulmonary embolus. The main pulmonary artery is dilated measuring approximately 3.9 cm. There is no CT evidence of acute right heart strain. Atherosclerotic changes are noted of the thoracic aorta. There is a ascending thoracic aortic aneurysm measuring approximately 4.5 cm. Heart size is borderline enlarged. Advanced coronary artery calcifications are noted. There is reflux of contrast in the IVC consistent with some degree of cardiac dysfunction. Mediastinum/Nodes: --there are prominent mediastinal and hilar lymph nodes. For example there is a precarinal lymph node measuring approximately 1.5 cm (previously measuring approximately the same. --No axillary lymphadenopathy. --No supraclavicular lymphadenopathy. --Normal thyroid gland. --The esophagus is unremarkable Lungs/Pleura: Again noted are findings compatible with fibrotic interstitial lung disease. There is a partially calcified opacity in the posterior right upper lobe measuring approximately 2.3 cm, stable from prior study (axial series 7, image 20). Upper Abdomen: Findings are suspicious for peripancreatic fat stranding involving the partially visualized pancreas. The liver surface appears nodular. The visualized spleen is unremarkable. Musculoskeletal: No chest wall abnormality. No acute or significant osseous findings. Review of the MIP images confirms the above findings. IMPRESSION: 1. No evidence for an acute pulmonary embolism. The main pulmonary artery remains dilated which can be seen in patients with elevated pulmonary artery pressures. 2. Partially visualized peripancreatic fat stranding raising concern for acute pancreatitis. Correlation with lipase is recommended. 3. There is a 4.5 cm ascending thoracic aortic aneurysm. Ascending thoracic aortic aneurysm. Recommend semi-annual imaging followup by  CTA or MRA and referral to cardiothoracic surgery if not already obtained. This recommendation follows 2010 ACCF/AHA/AATS/ACR/ASA/SCA/SCAI/SIR/STS/SVM Guidelines for the Diagnosis and Management of Patients With Thoracic Aortic Disease. Circulation. 2010; [id number]. Aortic aneurysm NOS (ICD10-I71.9) 4. Again noted are findings of fibrotic interstitial lung disease. 5. Mild mediastinal adenopathy, likely reactive and stable from prior study. 6. Mild cardiomegaly. There is reflux of contrast in the IVC consistent with underlying cardiac dysfunction. 7. Slightly nodular appearance of the liver which can be seen in patients with cirrhosis. Aortic Atherosclerosis (ICD10-I70.0). Electronically Signed   By: Constance Holster M.D.   On: 11/22/2019 19:40   ECHOCARDIOGRAM COMPLETE  Result Date: 11/23/2019   ECHOCARDIOGRAM REPORT   Patient Name:   FREDERICH STELLHORN Date of Exam: 11/23/2019 Medical Rec #:  [id number]  Height:       67.0 in Accession #:    [id number]   Weight:       175.0 lb Date of Birth:  [birth year]     BSA:          1.91 m Patient Age:    59 years     BP:           94/49 mmHg Patient Gender: M            HR:           99 bpm. Exam Location:  Inpatient Procedure: 2D Echo Indications:    Chest Pain 786.50 / R07.9  History:        Patient has prior history of Echocardiogram examinations, most                 recent 09/11/2018. Prior CABG, Pulmonary HTN; Risk Factors:Sleep                 Apnea. AAA.  Sonographer:    Vikki Ports Turrentine Referring Phys: [id number] [place] CRISTESCU  Sonographer Comments: Suboptimal apical window. Image acquisition challenging due to respiratory motion. IMPRESSIONS  1. Left ventricular ejection fraction, by visual estimation, is >75%. The left ventricle has hyperdynamic function. There is mildly increased left ventricular hypertrophy.  2. Left ventricular diastolic parameters are consistent with Grade I diastolic dysfunction (impaired relaxation).  3. The left ventricle has no  regional wall motion abnormalities.  4. Global right ventricle has severely reduced systolic function.The right ventricular size is moderately enlarged.  5. Left atrial size was mildly dilated.  6. Right atrial size was moderately dilated.  7. Mild mitral annular calcification.  8. The mitral valve is normal in structure. Trivial mitral valve regurgitation. No evidence of mitral stenosis.  9. The tricuspid valve is normal in structure. Tricuspid valve regurgitation is moderate. 10. The aortic valve is tricuspid. Aortic valve regurgitation is not visualized. Moderate aortic valve stenosis. 11. The pulmonic valve was normal in structure. Pulmonic valve regurgitation is not visualized. 12. Severely elevated pulmonary artery systolic pressure. 13. The inferior vena cava is dilated in size with >50% respiratory variability, suggesting right atrial pressure of 8 mmHg. 14. Hyperdynamic LV systolic function, grade 1 diastolic dysfunction; mild LVH; aortic valve calcified with moderate AS (mean gradient 24 mmHg); mild LAE; moderate RAE/RVE; severe RV dysfunction; moderate TR with severely elevated pulmonary pressure. FINDINGS  Left Ventricle: Left ventricular ejection fraction, by visual estimation, is >75%. The left ventricle has hyperdynamic function. The left ventricle has no regional wall motion abnormalities. There is mildly increased left ventricular hypertrophy. Left ventricular diastolic parameters are consistent with Grade I diastolic dysfunction (impaired relaxation). Normal left atrial pressure. Right Ventricle: The right ventricular size is moderately enlarged.Global RV systolic function is has severely reduced systolic function. The tricuspid regurgitant velocity is 4.69 m/s, and with an assumed right atrial pressure of 8 mmHg, the estimated right ventricular systolic pressure is severely elevated at 96.0 mmHg. Left Atrium: Left atrial size was mildly dilated. Right Atrium: Right atrial size was moderately  dilated Pericardium: There is no evidence of pericardial effusion. Mitral Valve: The mitral valve is normal in structure. Mild mitral annular calcification. Trivial mitral valve regurgitation. No evidence of mitral valve stenosis by observation. Tricuspid Valve: The tricuspid valve is normal in structure. Tricuspid valve regurgitation is moderate. Aortic Valve: The aortic valve is tricuspid. Aortic valve regurgitation is not visualized. Moderate aortic stenosis is present. Aortic valve mean gradient measures 22.0  mmHg. Aortic valve peak gradient measures 36.2 mmHg. Aortic valve area, by VTI measures 1.28 cm. Pulmonic Valve: The pulmonic valve was normal in structure. Pulmonic valve regurgitation is not visualized. Pulmonic regurgitation is not visualized. Aorta: The aortic root is normal in size and structure. Venous: The inferior vena cava is dilated in size with greater than 50% respiratory variability, suggesting right atrial pressure of 8 mmHg. IAS/Shunts: No atrial level shunt detected by color flow Doppler. Additional Comments: Hyperdynamic LV systolic function, grade 1 diastolic dysfunction; mild LVH; aortic valve calcified with moderate AS (mean gradient 24 mmHg); mild LAE; moderate RAE/RVE; severe RV dysfunction; moderate TR with severely elevated pulmonary pressure.  LEFT VENTRICLE PLAX 2D LVIDd:         4.40 cm LVIDs:         2.50 cm LV PW:         1.00 cm LV IVS:        1.00 cm LVOT diam:     2.00 cm LV SV:         65 ml LV SV Index:   33.45 LVOT Area:     3.14 cm  RIGHT VENTRICLE RV S prime:     10.90 cm/s LEFT ATRIUM           Index LA diam:      4.80 cm 2.51 cm/m LA Vol (A4C): 68.0 ml 35.59 ml/m  AORTIC VALVE AV Area (Vmax):    1.52 cm AV Area (Vmean):   1.52 cm AV Area (VTI):     1.28 cm AV Vmax:           301.00 cm/s AV Vmean:          217.333 cm/s AV VTI:            0.590 m AV Peak Grad:      36.2 mmHg AV Mean Grad:      22.0 mmHg LVOT Vmax:         146.00 cm/s LVOT Vmean:        105.000  cm/s LVOT VTI:          0.241 m LVOT/AV VTI ratio: 0.41  AORTA Ao Root diam: 3.70 cm TRICUSPID VALVE TR Peak grad:   88.0 mmHg TR Vmax:        469.00 cm/s  SHUNTS Systemic VTI:  0.24 m Systemic Diam: 2.00 cm  Kirk Ruths MD Electronically signed by Kirk Ruths MD Signature Date/Time: 11/23/2019/12:13:49 PM    Final     Cardiac Studies   Echo 11/23/19 1. Left ventricular ejection fraction, by visual estimation, is >75%. The  left ventricle has hyperdynamic function. There is mildly increased left  ventricular hypertrophy.  2. Left ventricular diastolic parameters are consistent with Grade I  diastolic dysfunction (impaired relaxation).  3. The left ventricle has no regional wall motion abnormalities.  4. Global right ventricle has severely reduced systolic function.The  right ventricular size is moderately enlarged.  5. Left atrial size was mildly dilated.  6. Right atrial size was moderately dilated.  7. Mild mitral annular calcification.  8. The mitral valve is normal in structure. Trivial mitral valve  regurgitation. No evidence of mitral stenosis.  9. The tricuspid valve is normal in structure. Tricuspid valve  regurgitation is moderate.  10. The aortic valve is tricuspid. Aortic valve regurgitation is not  visualized. Moderate aortic valve stenosis.  11. The pulmonic valve was normal in structure. Pulmonic valve  regurgitation is not visualized.  12. Severely elevated  pulmonary artery systolic pressure.  13. The inferior vena cava is dilated in size with >50% respiratory  variability, suggesting right atrial pressure of 8 mmHg.  14. Hyperdynamic LV systolic function, grade 1 diastolic dysfunction; mild  LVH; aortic valve calcified with moderate AS (mean gradient 24 mmHg); mild  LAE; moderate RAE/RVE; severe RV dysfunction; moderate TR with severely  elevated pulmonary pressure.   Right heart Cardiac cath 219 RA = 6 RV = 72/7 PA = 73/27 (44) PCW = 10 Fick cardiac  output/index = 4.1/2.1 PVR = 8.4 WU Ao sat = 91% PA sat = 58%, 58%  Assessment: 1. Moderate PAH with RV strain likely WHO Group I  Plan/Discussion:  Will start trial of sildenafil 25 tid  Patient Profile     86 y.o. male with hx of right heart failure, severe pulmonary hypertension, sleep apnea, recently diagnosed Afib (not on a/c due to h/o GI bleeding), aortic stent graft in 2009, COPD, and CAD who is being seen for chest pain and elevated troponin.  Assessment & Plan    Chest pain - Hs troponin mildly elevated, 30> 40 > 35. Trend is not consistent with ACS - EKG is unremarkable - PPI added - No nitrates due to possible hypotension (already on sildenafil) - Do not think invasive work-up is indicated Plan to continue medical management  Acute on chronic diastolic HF - BNP mildly elevated at 474 - Echo yesteray showed EF >75%, LV with hyperdynamic function, mild LVH, G1DD, RV with severely reduced systolic function - On home lasix dose, 40 mg BID. Difficult to diuresis given soft pressures - Encourage compression stockings - Since admission -1.3 L, 1.6 L urine in the last 24 hours - creatinine stable - If patient does not improve can consider Advanced HF consult - Md to see  CABG 1998 - Cath 2009 with patent grafts - Myoview in 2019 was low risk - continue ASA  Chronic respiratory failure - H/o of severe pulmonary HTN, OSA, RV failure, COPD - He had recenyly been prescribed steroids and abx for exacerbation - CXR showed stable borderline cardiomegaly with chronic ILD - CT chest negative for PE - Patient remains on 4 L O2 (at baseline he is on 2L O2)  Pulmonary HTN - on sildenafil  PAF - Recently diagnosed - Not on a/c 2/2 to GI bleed  H/o AAA - s/p stent graft in 2009 - CT angio chest showed 4.5 ascending thoracic AA  AS - mild to moderate on echo 2019  For questions or updates, please contact [place] Please consult [email or web address] for contact  info under        Signed, Bobbiejo Ishikawa Ninfa Meeker, PA-C  11/24/2019, 8:31 AM

## 2019-11-24 NOTE — Evaluation (Signed)
Physical Therapy Evaluation Patient Details Name: Vincent Robinson MRN: [id number] DOB: 28-Jan-1923 Today's Date: 11/24/2019   History of Present Illness  Vincent Robinson is a 86 y.o. male with medical history significant of COPD, interstitial lung disease, sleep apnea on BiPAP during the night, coronary artery disease status post CABG, congestive heart failure diastolic dysfunction ejection fraction 65%, severe pulmonary hypertension on Opsumit and Viagra, spontaneous pneumothorax, chronic atrial fibrillation, multiple episodes of GI bleed, thoracic and abdominal aortic aneurysm, came with a chief complaint of chest pain started this morning 7-10 on a scale 1-10 describes more like indigestion in the middle of the chest.  Clinical Impression  Pt admitted with above. Pt with noted SOB, 3/4 DOE with ambulation on 4LO2 via [place]. Even though pt doesn't need RW for stability, discussed with pt starting to use a RW for energy conservation to make it easier to amb further distances and minimize SOB. Pt with good home set up. Pt functioning at baseline with exception of worsening SOB. Acute PT to cont to follow to progress mobility.    Follow Up Recommendations No PT follow up;Supervision - Intermittent    Equipment Recommendations  Rolling walker with 5" wheels(he may have one)    Recommendations for Other Services       Precautions / Restrictions Precautions Precautions: Fall Precaution Comments: on 3lO2 via [place] and male [place]wick Restrictions Weight Bearing Restrictions: No      Mobility  Bed Mobility Overal bed mobility: Needs Assistance Bed Mobility: Supine to Sit     Supine to sit: Supervision;HOB elevated     General bed mobility comments: HOB elevated, increased time, labored effort  Transfers Overall transfer level: Needs assistance Equipment used: None Transfers: Sit to/from Stand Sit to Stand: Min guard         General transfer comment: min guard for safety and to  steady  Ambulation/Gait Ambulation/Gait assistance: Min guard Gait Distance (Feet): 100 Feet Assistive device: None Gait Pattern/deviations: Step-through pattern;Trunk flexed;Decreased stride length Gait velocity: dec Gait velocity interpretation: 1.31 - 2.62 ft/sec, indicative of limited community ambulator General Gait Details: pt with short shuffled steps, +SOB on 4Lo2 via [place], no pulse ox available to check SPO2, pt with 3 standing rest breaks, discussed using RW for energy conservation to decrease SOB and allow for increased ambulation tolerance/endurance/distance  Stairs            Wheelchair Mobility    Modified Rankin (Stroke Patients Only)       Balance Overall balance assessment: Needs assistance Sitting-balance support: Feet supported;No upper extremity supported Sitting balance-Leahy Scale: Good     Standing balance support: No upper extremity supported Standing balance-Leahy Scale: Fair Standing balance comment: suspect pt wouldn't be able to withstand pertebations                             Pertinent Vitals/Pain Pain Assessment: No/denies pain    Home Living Family/patient expects to be discharged to:: Private residence Living Arrangements: Spouse/significant other Available Help at Discharge: Family;Available 24 hours/day Type of Home: House Home Access: Stairs to enter   [employer] of Steps: 1 Home Layout: One level Home Equipment: Walker - 2 wheels;Cane - single point      Prior Function Level of Independence: Independent         Comments: wasn't using an AD, drives, order groceries online and picks them up     Hand Dominance   Dominant Hand: Right  Extremity/Trunk Assessment   Upper Extremity Assessment Upper Extremity Assessment: Generalized weakness    Lower Extremity Assessment Lower Extremity Assessment: Generalized weakness    Cervical / Trunk Assessment Cervical / Trunk Assessment: Kyphotic   Communication   Communication: No difficulties  Cognition Arousal/Alertness: Awake/alert Behavior During Therapy: WFL for tasks assessed/performed Overall Cognitive Status: Within Functional Limits for tasks assessed                                        General Comments General comments (skin integrity, edema, etc.): pt with bilat LE edema, worse in R LE, sore to touch above ankle    Exercises     Assessment/Plan    PT Assessment Patient needs continued PT services  PT Problem List Decreased strength;Decreased activity tolerance;Decreased balance;Decreased mobility       PT Treatment Interventions DME instruction;Gait training;Stair training;Functional mobility training;Therapeutic activities;Therapeutic exercise;Balance training    PT Goals (Current goals can be found in the Care Plan section)  Acute Rehab PT Goals Patient Stated Goal: home PT Goal Formulation: With patient Time For Goal Achievement: 12/08/19 Potential to Achieve Goals: Good    Frequency Min 3X/week   Barriers to discharge        Co-evaluation               AM-PAC PT "6 Clicks" Mobility  Outcome Measure Help needed turning from your back to your side while in a flat bed without using bedrails?: None Help needed moving from lying on your back to sitting on the side of a flat bed without using bedrails?: None Help needed moving to and from a bed to a chair (including a wheelchair)?: A Little Help needed standing up from a chair using your arms (e.g., wheelchair or bedside chair)?: A Little Help needed to walk in hospital room?: A Little Help needed climbing 3-5 steps with a railing? : A Little 6 Click Score: 20    End of Session Equipment Utilized During Treatment: Gait belt;Oxygen Activity Tolerance: Patient tolerated treatment well Patient left: in chair;with call bell/phone within reach Nurse Communication: Mobility status PT Visit Diagnosis: Unsteadiness on feet  (R26.81);Muscle weakness (generalized) (M62.81);Difficulty in walking, not elsewhere classified (R26.2)    Time: [id number] PT Time Calculation (min) (ACUTE ONLY): 28 min   Charges:   PT Evaluation $PT Eval Moderate Complexity: 1 Mod PT Treatments $Gait Training: 8-22 mins        Kittie Plater, PT, DPT Acute Rehabilitation Services Pager #: [phone] Office #: [phone]   Berline Lopes 11/24/2019, 11:41 AM

## 2019-11-25 ENCOUNTER — Other Ambulatory Visit: Payer: Medicare Other

## 2019-11-25 LAB — BASIC METABOLIC PANEL
Anion gap: 14 (ref 5–15)
BUN: 17 mg/dL (ref 8–23)
CO2: 27 mmol/L (ref 22–32)
Calcium: 8 mg/dL — ABNORMAL LOW (ref 8.9–10.3)
Chloride: 95 mmol/L — ABNORMAL LOW (ref 98–111)
Creatinine, Ser: 1.08 mg/dL (ref 0.61–1.24)
GFR calc Af Amer: >60 mL/min (ref >60)
GFR calc non Af Amer: 58 mL/min — ABNORMAL LOW (ref >60)
Glucose, Bld: 127 mg/dL — ABNORMAL HIGH (ref 70–99)
Potassium: 3.8 mmol/L (ref 3.5–5.1)
Sodium: 136 mmol/L (ref 135–145)

## 2019-11-25 MED ORDER — PANTOPRAZOLE SODIUM 40 MG PO TBEC: 40.0000 mg | DELAYED_RELEASE_TABLET | Freq: Two times a day (BID) | ORAL | 0 refills | Status: DC

## 2019-11-25 MED ORDER — MIDODRINE HCL 5 MG PO TABS: 5.0000 mg | ORAL_TABLET | Freq: Three times a day (TID) | ORAL | 0 refills | Status: DC

## 2019-11-25 NOTE — Discharge Instructions (Signed)
Heart Failure, Self Care Heart failure is a serious condition. This document explains the things you need to do to take care of yourself after a heart failure diagnosis. You may be asked to change your diet, take certain medicines, and make other lifestyle changes in order to stay as healthy as possible. Your health care provider may also give you more specific instructions. If you have problems or questions, contact your health care provider. What are the risks? Having heart failure puts you at higher risk for certain problems. These problems can get worse if you do not take good care of yourself. Problems may include:  Blood clotting problems. This may cause a stroke.  Damage to the kidneys, liver, or lungs.  Abnormal heart rhythms. Supplies needed:  Scale for monitoring weight.  Blood pressure monitor.  Notebook.  Medicines. How to care for yourself when you have heart failure Medicines Take over-the-counter and prescription medicines only as told by your health care provider. Medicines reduce the workload of your heart, slow the progression of heart failure, and improve symptoms. Take your medicines every day.  Do not stop taking your medicine unless your health care provider tells you to do so.  Do not skip any dose of medicine.  Refill your prescriptions before you run out of medicine. Eating and drinking   Eat heart-healthy foods. Talk with a dietitian to make an eating plan that is right for you. ? Choose foods that contain no trans fat and are low in saturated fat and cholesterol. Healthy choices include fresh or frozen fruits and vegetables, fish, lean meats, legumes, fat-free or low-fat dairy products, and whole-grain or high-fiber foods. ? Limit salt (sodium) if told by your health care provider. Sodium restriction may reduce symptoms of heart failure. Ask a dietitian to recommend heart-healthy seasonings. ? Use healthy cooking methods instead of frying. Healthy methods  include roasting, grilling, broiling, baking, poaching, steaming, and stir-frying.  Limit your fluid intake, if directed by your health care provider. Fluid restriction may reduce symptoms of heart failure. Alcohol use  Do not drink alcohol if: ? Your health care provider tells you not to drink. ? Your heart was damaged by alcohol, or you have severe heart failure. ? You are pregnant, may be pregnant, or are planning to become pregnant.  If you drink alcohol: ? Limit how much you use to:  0-1 drink a day for women.  0-2 drinks a day for men. ? Be aware of how much alcohol is in your drink. In the [place] one drink equals one 12 oz bottle of beer (355 mL), one 5 oz glass of wine (148 mL), or one 1 oz glass of hard liquor (44 mL). Lifestyle   Do not use any products that contain nicotine or tobacco, such as cigarettes, e-cigarettes, and chewing tobacco. If you need help quitting, ask your health care provider. ? Do not use nicotine gum or patches before talking to your health care provider.  Do not use illegal drugs.  Work with your health care provider to safely reach the right body weight.  Do physical activity if told by your health care provider. Talk to your health care provider before you begin an exercise if: ? You are an older adult. ? You have severe heart failure.  Learn to manage stress. If you need help to do this, ask your health care provider.  Participate in or seek rehabilitation as needed to keep or improve your independence and quality of life.  Plan  rest periods when you get tired. Monitoring important information   Weigh yourself every day. This will help you to notice if too much fluid is building up in your body. ? Weigh yourself every morning after you urinate and before you eat breakfast. ? Wear the same amount of clothing each time you weigh yourself. ? Record your daily weight. Provide your health care provider with your weight record.  Monitor and  record your pulse and blood pressure as told by your health care provider. Dealing with extreme temperatures  If the weather is extremely hot: ? Avoid vigorous physical activity. ? Use air conditioning or fans, or find a cooler location. ? Avoid caffeine and alcohol. ? Wear loose-fitting, lightweight, and light-colored clothing.  If the weather is extremely cold: ? Avoid vigorous activity. ? Layer your clothes. ? Wear mittens or gloves, a hat, and a scarf when you go outside. ? Avoid alcohol. Follow these instructions at home:  Stay up to date with vaccines. Pneumococcal and flu (influenza) vaccines are especially important in preventing infections of the airways.  Keep all follow-up visits as told by your health care provider. This is important. Contact a health care provider if you:  Have a rapid weight gain.  Have increasing shortness of breath.  Are unable to participate in your usual physical activities.  Get tired easily.  Cough more than normal, especially with physical activity.  Lose your appetite or feel nauseous.  Have any swelling or more swelling in areas such as your hands, feet, ankles, or abdomen.  Are unable to sleep because it is hard to breathe.  Feel like your heart is beating quickly (palpitations).  Become dizzy or light-headed when you stand up. Get help right away if you:  Have trouble breathing.  Notice or your family notices a change in your awareness, such as having trouble staying awake or concentrating.  Have pain or discomfort in your chest.  Have an episode of fainting (syncope). These symptoms may represent a serious problem that is an emergency. Do not wait to see if the symptoms will go away. Get medical help right away. Call your local emergency services (911 in the [place] Do not drive yourself to the hospital. Summary  Heart failure is a serious condition. To care for yourself, you may be asked to change your diet, take certain  medicines, and make other lifestyle changes.  Take your medicines every day. Do not stop taking them unless your health care provider tells you to do so.  Eat heart-healthy foods, such as fresh or frozen fruits and vegetables, fish, lean meats, legumes, fat-free or low-fat dairy products, and whole-grain or high-fiber foods.  Ask your health care provider if you have any alcohol restrictions. You may have to stop drinking alcohol if you have severe heart failure.  Contact your health care provider if you notice problems, such as rapid weight gain or a fast heartbeat. Get help right away if you faint, or have chest pain or trouble breathing. This information is not intended to replace advice given to you by your health care provider. Make sure you discuss any questions you have with your health care provider. Document Revised: 01/20/2019 Document Reviewed: 01/21/2019 [employer] Patient Education  2020 [employer]  

## 2019-11-25 NOTE — Discharge Summary (Signed)
Physician Discharge Summary  Vincent Robinson [id number] DOB: 1923-09-10 DOA: 11/22/2019  PCP: Katherina Mires, MD  Admit date: 11/22/2019 Discharge date: 11/25/2019  Admitted From: Home Discharge disposition: Home   Code Status: Partial Code  Diet Recommendation: Low-salt diet   Recommendations for Outpatient Follow-Up:   1. Follow-up with PCP 2. Follow-up with CHF clinic  Discharge Diagnosis:   Principal Problem:   Acute on chronic respiratory failure (HCC) Active Problems:   COPD (chronic obstructive pulmonary disease) (HCC)   ILD (interstitial lung disease) (HCC)   Lower GI bleed   Sleep apnea   Hypothyroidism   Hyperlipidemia   Coronary artery disease   AAA (abdominal aortic aneurysm) without rupture (HCC)   Pulmonary hypertension, unspecified (HCC)   S/P CABG (coronary artery bypass graft)   Chest pain   Pressure injury of skin   Chronic respiratory failure (HCC)    History of Present Illness / Brief narrative:  86 y.o. male with hx of right heart failure, severe pulmonary hypertension, sleep apnea, recently diagnosed Afib (not on a/c due to h/o GI bleeding), aortic stent graft in 2009, COPD on 2 L oxygen at home, and CAD. Patient presented to the ED on 1/31 with complaint of worsening shortness of breath and chest pain.   Patient was admitted under hospitalist service.  Cardiology consultation was obtained.    Hospital Course:  Chest pain - Hs troponin mildly elevated, 30> 40 > 35. Trend is not consistent with ACS - EKG is unremarkable -Suspect GERD related pain.  PPI added - No nitrates due to possible hypotension (already on sildenafil for pulmonary hypertension). -Per cardiology, invasive work-up is not indicated. -Continue medical management.  Acute on chronic diastolic HF/chronic right-sided heart failure Hypotension - BNP mildly elevated at 474 - Echo 11/23/2019 showed EF >75%, LV with hyperdynamic function, mild LVH, G1DD, RV with severely reduced  systolic function - On home lasix dose, 40 mg BID. Difficult to diurese given soft pressures - Encourage compression stockings - Since admission, patient has had more than 3 L of urine output - creatinine stable.  -Blood pressure has been running soft.  Noted that cardiology started the patient on midodrine 5 mg 3 times daily yesterday.  Will discharge on the same. -Cardiology recommended to discharge him back on his home medicines.  He will follow-up in advanced CHF clinic.  CAD s/p CABG 1998 - Cath 2009 with patent grafts - Myoview in 2019 was low risk - continue ASA  Acute on chronic respiratory failure -Baseline 2 L by nasal cannula.  Initially required high flow.  Secondary to CHF exacerbation - H/o of severe pulmonary HTN, OSA, RV failure, COPD - He had recenyly been prescribed steroids and abx for exacerbation - CXR this admission showed stable borderline cardiomegaly with chronic ILD - CT chest negative for PE -Currently, remains on baseline oxygen requirement of 2 L/min.  Pulmonary HTN - on sildenafil  PAF - Recently diagnosed - Not on a/c 2/2 to GI bleed  H/o AAA - s/p stent graft in 2009 - CT angio chest showed 4.5 ascending thoracic AA  AS - mild to moderate on echo 2019  Stable for discharge today.  Seen by physical therapy.  No PT needs at discharge. Subjective:  Seen and examined this morning.  Pleasant elderly Caucasian male.  Relatively strong physical mental status for his age.  Feels ready to go home.  Discharge Exam:   Vitals:   11/25/19 0300 11/25/19 0500 11/25/19 0600 11/25/19 0753  BP: (!) 86/47  (!) 105/59   Pulse: 62   72  Resp: 18  18 18   Temp: 98 F (36.7 C)  97.8 F (36.6 C)   TempSrc: Oral  Oral   SpO2: 94%  94% 92%  Weight:  87.1 kg    Height:        Body mass index is 30.07 kg/m.  General exam: Appears calm and comfortable.  Skin: No rashes, lesions or ulcers. HEENT: Atraumatic, normocephalic, supple neck, no obvious  bleeding Lungs: Diminished air entry at both bases, otherwise clear to auscultation CVS: Regular rate and rhythm, no murmur GI/Abd soft, nontender, nondistended, bowel sound present CNS: Alert, awake, oriented x3 Psychiatry: Mood appropriate Extremities: Bilateral pedal edema 1+.  Discharge Instructions:  Wound care: None Discharge Instructions    (HEART FAILURE PATIENTS) Call MD:  Anytime you have any of the following symptoms: 1) 3 pound weight gain in 24 hours or 5 pounds in 1 week 2) shortness of breath, with or without a dry hacking cough 3) swelling in the hands, feet or stomach 4) if you have to sleep on extra pillows at night in order to breathe.   Complete by: As directed    AMB referral to CHF clinic   Complete by: As directed    Diet - low sodium heart healthy   Complete by: As directed    Increase activity slowly   Complete by: As directed      Follow-up Information    [facility] Follow up on 12/03/2019.   Specialty: Cardiology Why: at 1:30  Contact information: [address] [id number] [place]       Katherina Mires, MD Follow up.   Specialty: Family Medicine Contact information: [place] [address] [phone]        Lorretta Harp, MD .   Specialties: Cardiology, Radiology Contact information: [address] [place] [address] [phone]          Allergies as of 11/25/2019   No Known Allergies     Medication List    STOP taking these medications   amoxicillin 500 MG tablet Commonly known as: AMOXIL   azithromycin 250 MG tablet Commonly known as: Zithromax Z-Pak     TAKE these medications   albuterol 108 (90 Base) MCG/ACT inhaler Commonly known as: VENTOLIN HFA Inhale 1 puff into the lungs 2 (two) times daily.   aspirin 81 MG chewable tablet Commonly known as: Aspirin 81 Chew 1 tablet  (81 mg total) by mouth daily. Restart in 1 week following hospitalization What changed: additional instructions   fluticasone 27.5 MCG/SPRAY nasal spray Commonly known as: VERAMYST Place 1 spray into the nose daily.   Fluticasone-Salmeterol 113-14 MCG/ACT Aepb INHALE 1 PUFF TWICE A DAY What changed: how to take this   furosemide 40 MG tablet Commonly known as: LASIX TAKE ONE TABLET BY MOUTH DAILY What changed: when to take this   levothyroxine 112 MCG tablet Commonly known as: SYNTHROID Take 112 mcg by mouth daily before breakfast.   loratadine 10 MG tablet Commonly known as: CLARITIN Take 10 mg by mouth daily.   midodrine 5 MG tablet Commonly known as: PROAMATINE Take 1 tablet (5 mg total) by mouth 3 (three) times daily with meals.   montelukast 10 MG tablet Commonly known as: SINGULAIR TAKE ONE TABLET BY MOUTH AT BEDTIME   Opsumit 10 MG tablet Generic  drug: macitentan Take 1 tablet (10 mg total) by mouth daily.   pantoprazole 40 MG tablet Commonly known as: PROTONIX Take 1 tablet (40 mg total) by mouth 2 (two) times daily.   predniSONE 20 MG tablet Commonly known as: Deltasone Take 2 tablets (40 mg total) by mouth daily with breakfast.   sildenafil 20 MG tablet Commonly known as: REVATIO Take 2 tablets (40 mg total) by mouth 3 (three) times daily.   simvastatin 10 MG tablet Commonly known as: ZOCOR Take 10 mg by mouth at bedtime.            Durable Medical Equipment  (From admission, onward)         Start     Ordered   11/24/19 1548  For home use only DME 4 wheeled rolling walker with seat  Once    Question:  Patient needs a walker to treat with the following condition  Answer:  Weakness   11/24/19 1547          Time coordinating discharge: 35 minutes  The results of significant diagnostics from this hospitalization (including imaging, microbiology, ancillary and laboratory) are listed below for reference.    Procedures and Diagnostic  Studies:   DG Chest 2 View  Result Date: 11/22/2019 CLINICAL DATA:  Shortness of breath. Left-sided chest pain. Pulmonary fibrosis. EXAM: CHEST - 2 VIEW COMPARISON:  11/16/2019 FINDINGS: Stable borderline cardiomegaly. Prior CABG. Chronic interstitial lung disease is again demonstrated. No evidence of acute infiltrate, pneumothorax, or pleural effusion. IMPRESSION: Chronic interstitial lung disease. No acute findings. Electronically Signed   By: Marlaine Hind M.D.   On: 11/22/2019 17:10   CT Angio Chest PE W and/or Wo Contrast  Result Date: 11/22/2019 CLINICAL DATA:  Shortness of breath. EXAM: CT ANGIOGRAPHY CHEST WITH CONTRAST TECHNIQUE: Multidetector CT imaging of the chest was performed using the standard protocol during bolus administration of intravenous contrast. Multiplanar CT image reconstructions and MIPs were obtained to evaluate the vascular anatomy. CONTRAST:  9mL OMNIPAQUE IOHEXOL 300 MG/ML  SOLN COMPARISON:  CT dated January 06, 2018. FINDINGS: Cardiovascular: Contrast injection is sufficient to demonstrate satisfactory opacification of the pulmonary arteries to the segmental level. There is no pulmonary embolus. The main pulmonary artery is dilated measuring approximately 3.9 cm. There is no CT evidence of acute right heart strain. Atherosclerotic changes are noted of the thoracic aorta. There is a ascending thoracic aortic aneurysm measuring approximately 4.5 cm. Heart size is borderline enlarged. Advanced coronary artery calcifications are noted. There is reflux of contrast in the IVC consistent with some degree of cardiac dysfunction. Mediastinum/Nodes: --there are prominent mediastinal and hilar lymph nodes. For example there is a precarinal lymph node measuring approximately 1.5 cm (previously measuring approximately the same. --No axillary lymphadenopathy. --No supraclavicular lymphadenopathy. --Normal thyroid gland. --The esophagus is unremarkable Lungs/Pleura: Again noted are findings  compatible with fibrotic interstitial lung disease. There is a partially calcified opacity in the posterior right upper lobe measuring approximately 2.3 cm, stable from prior study (axial series 7, image 20). Upper Abdomen: Findings are suspicious for peripancreatic fat stranding involving the partially visualized pancreas. The liver surface appears nodular. The visualized spleen is unremarkable. Musculoskeletal: No chest wall abnormality. No acute or significant osseous findings. Review of the MIP images confirms the above findings. IMPRESSION: 1. No evidence for an acute pulmonary embolism. The main pulmonary artery remains dilated which can be seen in patients with elevated pulmonary artery pressures. 2. Partially visualized peripancreatic fat stranding raising concern for acute pancreatitis.  Correlation with lipase is recommended. 3. There is a 4.5 cm ascending thoracic aortic aneurysm. Ascending thoracic aortic aneurysm. Recommend semi-annual imaging followup by CTA or MRA and referral to cardiothoracic surgery if not already obtained. This recommendation follows 2010 ACCF/AHA/AATS/ACR/ASA/SCA/SCAI/SIR/STS/SVM Guidelines for the Diagnosis and Management of Patients With Thoracic Aortic Disease. Circulation. 2010; [id number]. Aortic aneurysm NOS (ICD10-I71.9) 4. Again noted are findings of fibrotic interstitial lung disease. 5. Mild mediastinal adenopathy, likely reactive and stable from prior study. 6. Mild cardiomegaly. There is reflux of contrast in the IVC consistent with underlying cardiac dysfunction. 7. Slightly nodular appearance of the liver which can be seen in patients with cirrhosis. Aortic Atherosclerosis (ICD10-I70.0). Electronically Signed   By: Constance Holster M.D.   On: 11/22/2019 19:40   ECHOCARDIOGRAM COMPLETE  Result Date: 11/23/2019   ECHOCARDIOGRAM REPORT   Patient Name:   JAKARRI HOLZ Date of Exam: 11/23/2019 Medical Rec #:  [id number]    Height:       67.0 in Accession #:     [id number]   Weight:       175.0 lb Date of Birth:  [birth year]     BSA:          1.91 m Patient Age:    14 years     BP:           94/49 mmHg Patient Gender: M            HR:           99 bpm. Exam Location:  Inpatient Procedure: 2D Echo Indications:    Chest Pain 786.50 / R07.9  History:        Patient has prior history of Echocardiogram examinations, most                 recent 09/11/2018. Prior CABG, Pulmonary HTN; Risk Factors:Sleep                 Apnea. AAA.  Sonographer:    Vikki Ports Turrentine Referring Phys: [id number] [place] [facility]  Sonographer Comments: Suboptimal apical window. Image acquisition challenging due to respiratory motion. IMPRESSIONS  1. Left ventricular ejection fraction, by visual estimation, is >75%. The left ventricle has hyperdynamic function. There is mildly increased left ventricular hypertrophy.  2. Left ventricular diastolic parameters are consistent with Grade I diastolic dysfunction (impaired relaxation).  3. The left ventricle has no regional wall motion abnormalities.  4. Global right ventricle has severely reduced systolic function.The right ventricular size is moderately enlarged.  5. Left atrial size was mildly dilated.  6. Right atrial size was moderately dilated.  7. Mild mitral annular calcification.  8. The mitral valve is normal in structure. Trivial mitral valve regurgitation. No evidence of mitral stenosis.  9. The tricuspid valve is normal in structure. Tricuspid valve regurgitation is moderate. 10. The aortic valve is tricuspid. Aortic valve regurgitation is not visualized. Moderate aortic valve stenosis. 11. The pulmonic valve was normal in structure. Pulmonic valve regurgitation is not visualized. 12. Severely elevated pulmonary artery systolic pressure. 13. The inferior vena cava is dilated in size with >50% respiratory variability, suggesting right atrial pressure of 8 mmHg. 14. Hyperdynamic LV systolic function, grade 1 diastolic dysfunction; mild LVH; aortic  valve calcified with moderate AS (mean gradient 24 mmHg); mild LAE; moderate RAE/RVE; severe RV dysfunction; moderate TR with severely elevated pulmonary pressure. FINDINGS  Left Ventricle: Left ventricular ejection fraction, by visual estimation, is >75%. The left ventricle has hyperdynamic function. The left ventricle has no  regional wall motion abnormalities. There is mildly increased left ventricular hypertrophy. Left ventricular diastolic parameters are consistent with Grade I diastolic dysfunction (impaired relaxation). Normal left atrial pressure. Right Ventricle: The right ventricular size is moderately enlarged.Global RV systolic function is has severely reduced systolic function. The tricuspid regurgitant velocity is 4.69 m/s, and with an assumed right atrial pressure of 8 mmHg, the estimated right ventricular systolic pressure is severely elevated at 96.0 mmHg. Left Atrium: Left atrial size was mildly dilated. Right Atrium: Right atrial size was moderately dilated Pericardium: There is no evidence of pericardial effusion. Mitral Valve: The mitral valve is normal in structure. Mild mitral annular calcification. Trivial mitral valve regurgitation. No evidence of mitral valve stenosis by observation. Tricuspid Valve: The tricuspid valve is normal in structure. Tricuspid valve regurgitation is moderate. Aortic Valve: The aortic valve is tricuspid. Aortic valve regurgitation is not visualized. Moderate aortic stenosis is present. Aortic valve mean gradient measures 22.0 mmHg. Aortic valve peak gradient measures 36.2 mmHg. Aortic valve area, by VTI measures 1.28 cm. Pulmonic Valve: The pulmonic valve was normal in structure. Pulmonic valve regurgitation is not visualized. Pulmonic regurgitation is not visualized. Aorta: The aortic root is normal in size and structure. Venous: The inferior vena cava is dilated in size with greater than 50% respiratory variability, suggesting right atrial pressure of 8 mmHg.  IAS/Shunts: No atrial level shunt detected by color flow Doppler. Additional Comments: Hyperdynamic LV systolic function, grade 1 diastolic dysfunction; mild LVH; aortic valve calcified with moderate AS (mean gradient 24 mmHg); mild LAE; moderate RAE/RVE; severe RV dysfunction; moderate TR with severely elevated pulmonary pressure.  LEFT VENTRICLE PLAX 2D LVIDd:         4.40 cm LVIDs:         2.50 cm LV PW:         1.00 cm LV IVS:        1.00 cm LVOT diam:     2.00 cm LV SV:         65 ml LV SV Index:   33.45 LVOT Area:     3.14 cm  RIGHT VENTRICLE RV S prime:     10.90 cm/s LEFT ATRIUM           Index LA diam:      4.80 cm 2.51 cm/m LA Vol (A4C): 68.0 ml 35.59 ml/m  AORTIC VALVE AV Area (Vmax):    1.52 cm AV Area (Vmean):   1.52 cm AV Area (VTI):     1.28 cm AV Vmax:           301.00 cm/s AV Vmean:          217.333 cm/s AV VTI:            0.590 m AV Peak Grad:      36.2 mmHg AV Mean Grad:      22.0 mmHg LVOT Vmax:         146.00 cm/s LVOT Vmean:        105.000 cm/s LVOT VTI:          0.241 m LVOT/AV VTI ratio: 0.41  AORTA Ao Root diam: 3.70 cm TRICUSPID VALVE TR Peak grad:   88.0 mmHg TR Vmax:        469.00 cm/s  SHUNTS Systemic VTI:  0.24 m Systemic Diam: 2.00 cm  Kirk Ruths MD Electronically signed by Kirk Ruths MD Signature Date/Time: 11/23/2019/12:13:49 PM    Final      Labs:   Basic Metabolic Panel:  Recent Labs  Lab 11/22/19 1626 11/22/19 1626 11/22/19 2340 11/23/19 1022 11/23/19 1022 11/24/19 0531 11/25/19 0728  NA 138  --   --  137  --  133* 136  K 2.8*   < >  --  4.2   < > 3.8 3.8  CL 101  --   --  104  --  100 95*  CO2 26  --   --  24  --  25 27  GLUCOSE 85  --   --  91  --  87 127*  BUN 26*  --   --  21  --  20 17  CREATININE 1.08  --  0.93 1.07  --  1.10 1.08  CALCIUM 8.0*  --   --  7.9*  --  7.6* 8.0*  MG  --   --  2.8*  --   --   --   --    < > = values in this interval not displayed.   GFR Estimated Creatinine Clearance: 44.1 mL/min (by C-G formula based on SCr  of 1.08 mg/dL). Liver Function Tests: No results for input(s): AST, ALT, ALKPHOS, BILITOT, PROT, ALBUMIN in the last 168 hours. No results for input(s): LIPASE, AMYLASE in the last 168 hours. No results for input(s): AMMONIA in the last 168 hours. Coagulation profile No results for input(s): INR, PROTIME in the last 168 hours.  CBC: Recent Labs  Lab 11/22/19 1626 11/24/19 0531  WBC 16.0* 14.3*  NEUTROABS 12.0*  --   HGB 11.2* 9.8*  HCT 36.6* 31.9*  MCV 78.2* 77.4*  PLT 396 329   Cardiac Enzymes: No results for input(s): CKTOTAL, CKMB, CKMBINDEX, TROPONINI in the last 168 hours. BNP: Invalid input(s): POCBNP CBG: No results for input(s): GLUCAP in the last 168 hours. D-Dimer No results for input(s): DDIMER in the last 72 hours. Hgb A1c No results for input(s): HGBA1C in the last 72 hours. Lipid Profile No results for input(s): CHOL, HDL, LDLCALC, TRIG, CHOLHDL, LDLDIRECT in the last 72 hours. Thyroid function studies No results for input(s): TSH, T4TOTAL, T3FREE, THYROIDAB in the last 72 hours.  Invalid input(s): FREET3 Anemia work up No results for input(s): VITAMINB12, FOLATE, FERRITIN, TIBC, IRON, RETICCTPCT in the last 72 hours. Microbiology Recent Results (from the past 240 hour(s))  SARS CORONAVIRUS 2 (TAT 6-24 HRS) Nasopharyngeal Nasopharyngeal Swab     Status: None   Collection Time: 11/22/19  8:08 PM   Specimen: Nasopharyngeal Swab  Result Value Ref Range Status   SARS Coronavirus 2 NEGATIVE NEGATIVE Final    Comment: (NOTE) SARS-CoV-2 target nucleic acids are NOT DETECTED. The SARS-CoV-2 RNA is generally detectable in upper and lower respiratory specimens during the acute phase of infection. Negative results do not preclude SARS-CoV-2 infection, do not rule out co-infections with other pathogens, and should not be used as the sole basis for treatment or other patient management decisions. Negative results must be combined with clinical  observations, patient history, and epidemiological information. The expected result is Negative. Fact Sheet for Patients: SugarRoll.be Fact Sheet for Healthcare Providers: https://www.woods-mathews.com/ This test is not yet approved or cleared by the [place] FDA and  has been authorized for detection and/or diagnosis of SARS-CoV-2 by FDA under an Emergency Use Authorization (EUA). This EUA will remain  in effect (meaning this test can be used) for the duration of the COVID-19 declaration under Section 56 4(b)(1) of the Act, 21 U.S.C. section 360bbb-3(b)(1), unless the authorization is terminated or revoked sooner. Performed  at [facility], [place] [address], [place], [place] [address]     Please note: You were cared for by a hospitalist during your hospital stay. Once you are discharged, your primary care physician will handle any further medical issues. Please note that NO REFILLS for any discharge medications will be authorized once you are discharged, as it is imperative that you return to your primary care physician (or establish a relationship with a primary care physician if you do not have one) for your post hospital discharge needs so that they can reassess your need for medications and monitor your lab values.  Signed: Marlowe Aschoff Lynee Rosenbach  [facility] 11/25/2019, 11:04 AM

## 2019-11-25 NOTE — Progress Notes (Signed)
   11/25/19 0300  Vitals  Temp 98 F (36.7 C)  Temp Source Oral  BP (!) 86/47  MAP (mmHg) (!) 60  BP Location Right Arm  BP Method Automatic  Patient Position (if appropriate) Lying  Pulse Rate 62  Pulse Rate Source Dinamap  ECG Heart Rate 67  Cardiac Rhythm Atrial fibrillation  Ectopy PAC  Ectopy Frequency Frequent  Resp 18  Level of Consciousness  Level of Consciousness Alert  Oxygen Therapy  SpO2 94 %  O2 Device Nasal Cannula (Patient requested to be back on [place])  O2 Flow Rate (L/min) 3 L/min  Pulse Oximetry Type Continuous  MEWS Score  MEWS Temp 0  MEWS Systolic 1  MEWS Pulse 0  MEWS RR 0  MEWS LOC 0  MEWS Score 1  MEWS Score Color Green  MD notified of BP.

## 2019-11-25 NOTE — Progress Notes (Signed)
Physical Therapy Treatment Patient Details Name: Vincent Robinson MRN: [id number] DOB: 05-27-1923 Today's Date: 11/25/2019    History of Present Illness Demarqus Goon is a 86 y.o. male with medical history significant of COPD, interstitial lung disease, sleep apnea on BiPAP during the night, coronary artery disease status post CABG, congestive heart failure diastolic dysfunction ejection fraction 65%, severe pulmonary hypertension on Opsumit and Viagra, spontaneous pneumothorax, chronic atrial fibrillation, multiple episodes of GI bleed, thoracic and abdominal aortic aneurysm, came with a chief complaint of chest pain started this morning 7-10 on a scale 1-10 describes more like indigestion in the middle of the chest.    PT Comments    Pt with much improved ambulation distance this session, and benefited from use of rollator for UE support/steadying and rest breaks as needed to recover dyspnea. Pt with drop is SpO2 81-84% after ~100 ft increments of ambulation, pt encouraged to use purse lip breathing and seated/standing rest breaks as needed. PT also educated pt on the importance of pausing mobility PRIOR to onset of severe dyspnea as his work of breathing coincides with O2 desaturation. PT still recommending rollator for d/c, case manager updated. PT to continue to follow acutely.    Follow Up Recommendations  No PT follow up;Supervision - Intermittent     Equipment Recommendations  Rolling walker with 5" wheels(he may have one)    Recommendations for Other Services       Precautions / Restrictions Precautions Precautions: Fall Precaution Comments: on 3lO2 via [place] and male purwick Restrictions Weight Bearing Restrictions: No    Mobility  Bed Mobility Overal bed mobility: Needs Assistance Bed Mobility: Supine to Sit;Sit to Supine     Supine to sit: Supervision;HOB elevated Sit to supine: Supervision;HOB elevated   General bed mobility comments: supervision for safety, increased time  and effort with use of HOB elevation.  Transfers Overall transfer level: Needs assistance Equipment used: None Transfers: Sit to/from Stand Sit to Stand: Min guard;Supervision         General transfer comment: min guard initially on first stand attempt and when standing from rollator seat, supervision when standing from [facility]. Increased time to rise and steady.  Ambulation/Gait Ambulation/Gait assistance: Min guard;Supervision Gait Distance (Feet): 220 Feet(100+80+20+20) Assistive device: 4-wheeled walker Gait Pattern/deviations: Step-through pattern;Trunk flexed;Decreased stride length Gait velocity: decr   General Gait Details: Min guard to supervision for safety, verbal cuing for upright posture. DOE 3/4 after first 100 ft ambulation, with SpO2 81% on 2LO2, increased to 3LO2 for remainder of ambulation and instructed in pursed lip breathing to recover sats >90%. 2 rest breaks during hallway ambulation, one seated and one standing for ~2 minutes each to recover dyspnea and desat.   Stairs             Wheelchair Mobility    Modified Rankin (Stroke Patients Only)       Balance Overall balance assessment: Needs assistance Sitting-balance support: Feet supported;No upper extremity supported Sitting balance-Leahy Scale: Good Sitting balance - Comments: able to sit without PT support   Standing balance support: No upper extremity supported Standing balance-Leahy Scale: Fair Standing balance comment: able to ambulate without AD, but generally unsteady and cannot accept challenges                            Cognition Arousal/Alertness: Awake/alert Behavior During Therapy: WFL for tasks assessed/performed Overall Cognitive Status: Within Functional Limits for tasks assessed  Exercises      General Comments General comments (skin integrity, edema, etc.): SpO2 81-93% on 3LO2, recovers sats with seated  rest breaks and breathing technique      Pertinent Vitals/Pain Pain Assessment: No/denies pain    Home Living                      Prior Function            PT Goals (current goals can now be found in the care plan section) Acute Rehab PT Goals Patient Stated Goal: home PT Goal Formulation: With patient Time For Goal Achievement: 12/08/19 Potential to Achieve Goals: Good Progress towards PT goals: Progressing toward goals    Frequency    Min 3X/week      PT Plan Current plan remains appropriate    Co-evaluation              AM-PAC PT "6 Clicks" Mobility   Outcome Measure  Help needed turning from your back to your side while in a flat bed without using bedrails?: None Help needed moving from lying on your back to sitting on the side of a flat bed without using bedrails?: None Help needed moving to and from a bed to a chair (including a wheelchair)?: A Little Help needed standing up from a chair using your arms (e.g., wheelchair or bedside chair)?: None Help needed to walk in hospital room?: A Little Help needed climbing 3-5 steps with a railing? : A Little 6 Click Score: 21    End of Session Equipment Utilized During Treatment: Gait belt;Oxygen Activity Tolerance: Patient limited by fatigue Patient left: with call bell/phone within reach;in bed;with bed alarm set Nurse Communication: Mobility status PT Visit Diagnosis: Unsteadiness on feet (R26.81);Muscle weakness (generalized) (M62.81);Difficulty in walking, not elsewhere classified (R26.2)     Time: 0940-1000 PT Time Calculation (min) (ACUTE ONLY): 20 min  Charges:  $Gait Training: 8-22 mins                     Kimberleigh Mehan E, PT [place] Pager [phone]  Office [phone]   [place] 11/25/2019, 11:29 AM

## 2019-11-25 NOTE — Progress Notes (Signed)
Discharge instructions reviewed with pt and his daughter. Copy of instructions  given to pt, new scripts sent to pt's pharmacy electronically by MD, pt informed. Pt d/c'd with his rollator walker.    At 1600 Pt d/c'd via wheelchair with belongings, with daughter.            Escorted by unit NT.

## 2019-11-25 NOTE — TOC Progression Note (Signed)
Transition of Care [facility]) - Progression Note    Patient Details  Name: Vincent Robinson MRN: [id number] Date of Birth: 01-Dec-1922  Transition of Care [facility]) CM/SW [place], [place] Phone Number: 11/25/2019, 10:47 AM  Clinical Narrative:    Spoke with PT who requested rollator prior to dc for pt. DME has been ordered, will be delivered to pt room today.    Expected Discharge Plan: Home/Self Care Barriers to Discharge: Continued Medical Work up  Expected Discharge Plan and Services Expected Discharge Plan: Home/Self Care In-house Referral: Clinical Social Work Discharge Planning Services: CM Consult Post Acute Care Choice: Durable Medical Equipment Living arrangements for the past 2 months: Single Family Home                 DME Arranged: Oxygen, Walker rolling with seat DME Agency: [employer] Date DME Agency Contacted: 11/24/19 Time DME Agency Contacted: [phone] Representative spoke with at DME Agency: Bethanne Ginger     Readmission Risk Interventions Readmission Risk Prevention Plan 11/24/2019  Transportation Screening Complete  PCP or Specialist Appt within 5-7 Days Patient refused  Home Care Screening Complete  Medication Review (RN CM) Referral to Pharmacy  Some recent data might be hidden

## 2019-12-03 ENCOUNTER — Ambulatory Visit (HOSPITAL_COMMUNITY)
Admit: 2019-12-03 | Discharge: 2019-12-03 | Disposition: A | Discharge status: Home / Self Care | Payer: Medicare Other | Source: Ambulatory Visit | Attending: Adult Health | Admitting: Adult Health

## 2019-12-03 ENCOUNTER — Encounter (HOSPITAL_COMMUNITY): Payer: Self-pay

## 2019-12-03 ENCOUNTER — Other Ambulatory Visit: Payer: Self-pay

## 2019-12-03 VITALS — BP 124/78 | HR 99 | Wt 188.8 lb

## 2019-12-03 DIAGNOSIS — I4819 Other persistent atrial fibrillation: Secondary | ICD-10-CM | POA: Insufficient documentation

## 2019-12-03 DIAGNOSIS — I5082 Biventricular heart failure: Secondary | ICD-10-CM | POA: Insufficient documentation

## 2019-12-03 DIAGNOSIS — I5033 Acute on chronic diastolic (congestive) heart failure: Secondary | ICD-10-CM | POA: Diagnosis present

## 2019-12-03 DIAGNOSIS — J9610 Chronic respiratory failure, unspecified whether with hypoxia or hypercapnia: Secondary | ICD-10-CM | POA: Diagnosis not present

## 2019-12-03 DIAGNOSIS — G4733 Obstructive sleep apnea (adult) (pediatric): Secondary | ICD-10-CM | POA: Diagnosis not present

## 2019-12-03 DIAGNOSIS — I4891 Unspecified atrial fibrillation: Secondary | ICD-10-CM

## 2019-12-03 DIAGNOSIS — Z87891 Personal history of nicotine dependence: Secondary | ICD-10-CM | POA: Diagnosis not present

## 2019-12-03 DIAGNOSIS — Z79899 Other long term (current) drug therapy: Secondary | ICD-10-CM | POA: Insufficient documentation

## 2019-12-03 DIAGNOSIS — I50812 Chronic right heart failure: Secondary | ICD-10-CM | POA: Diagnosis not present

## 2019-12-03 DIAGNOSIS — E785 Hyperlipidemia, unspecified: Secondary | ICD-10-CM | POA: Diagnosis not present

## 2019-12-03 DIAGNOSIS — J449 Chronic obstructive pulmonary disease, unspecified: Secondary | ICD-10-CM | POA: Insufficient documentation

## 2019-12-03 DIAGNOSIS — I2510 Atherosclerotic heart disease of native coronary artery without angina pectoris: Secondary | ICD-10-CM | POA: Diagnosis not present

## 2019-12-03 DIAGNOSIS — Z9981 Dependence on supplemental oxygen: Secondary | ICD-10-CM | POA: Diagnosis not present

## 2019-12-03 DIAGNOSIS — I2729 Other secondary pulmonary hypertension: Secondary | ICD-10-CM | POA: Insufficient documentation

## 2019-12-03 DIAGNOSIS — I272 Pulmonary hypertension, unspecified: Secondary | ICD-10-CM | POA: Diagnosis not present

## 2019-12-03 DIAGNOSIS — I714 Abdominal aortic aneurysm, without rupture: Secondary | ICD-10-CM | POA: Insufficient documentation

## 2019-12-03 DIAGNOSIS — J849 Interstitial pulmonary disease, unspecified: Secondary | ICD-10-CM | POA: Insufficient documentation

## 2019-12-03 DIAGNOSIS — I350 Nonrheumatic aortic (valve) stenosis: Secondary | ICD-10-CM | POA: Diagnosis not present

## 2019-12-03 DIAGNOSIS — Z951 Presence of aortocoronary bypass graft: Secondary | ICD-10-CM | POA: Diagnosis not present

## 2019-12-03 DIAGNOSIS — E039 Hypothyroidism, unspecified: Secondary | ICD-10-CM | POA: Diagnosis not present

## 2019-12-03 LAB — BASIC METABOLIC PANEL
Anion gap: 7 (ref 5–15)
BUN: 17 mg/dL (ref 8–23)
CO2: 25 mmol/L (ref 22–32)
Calcium: 7.9 mg/dL — ABNORMAL LOW (ref 8.9–10.3)
Chloride: 103 mmol/L (ref 98–111)
Creatinine, Ser: 1.27 mg/dL — ABNORMAL HIGH (ref 0.61–1.24)
GFR calc Af Amer: 56 mL/min — ABNORMAL LOW (ref >60)
GFR calc non Af Amer: 48 mL/min — ABNORMAL LOW (ref >60)
Glucose, Bld: 128 mg/dL — ABNORMAL HIGH (ref 70–99)
Potassium: 3.5 mmol/L (ref 3.5–5.1)
Sodium: 135 mmol/L (ref 135–145)

## 2019-12-03 LAB — BRAIN NATRIURETIC PEPTIDE: B Natriuretic Peptide: 398.6 pg/mL — ABNORMAL HIGH (ref 0.0–100.0)

## 2019-12-03 NOTE — Patient Instructions (Signed)
Lab work done today. We will notify you of any abnormal lab work. No news is good news!  You have been referred to [facility] to have home lasix.  FOR TODAY ONLY Take Furosemide 80mg  when you get home.  Please DO NOT take any pill form of Furosemide for the next TWO DAYS while you are receiving IV Furosemide.  Please follow up with the [facility] in 2 weeks.  At the [facility], you and your health needs are our priority. As part of our continuing mission to provide you with exceptional heart care, we have created designated Provider Care Teams. These Care Teams include your primary Cardiologist (physician) and Advanced Practice Providers (APPs- Physician Assistants and Nurse Practitioners) who all work together to provide you with the care you need, when you need it.   You may see any of the following providers on your designated Care Team at your next follow up: Marland Kitchen Dr Glori Bickers . Dr Loralie Champagne . Darrick Grinder, NP . Lyda Jester, PA . Audry Riles, PharmD   Please be sure to bring in all your medications bottles to every appointment.

## 2019-12-03 NOTE — Progress Notes (Signed)
ADVANCED HF CLINIC NOTE  Primary Cardiologist: Dr. Gwenlyn Found  HF MD: Dr Marquette Saa   HPI: Vincent Robinson is 86 year old with past history of COPD, hypersomnia with sleep apnea, allergic rhinitis, cough variant asthma. . He has history of sleep apnea on BiPAP at settings of 20/6. This is report of CT of the chest from [place] which shows calcified nodule, chronic scarring. 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 He moved to [place] in 1956. Apparently his immigration was delayed due to a lung abnormality on imaging. He was eventually cleared. He does not have any history of TB, exposure to TB. He's had an episode of influenza while in [place].  Hospitalized in July, 2018 for a spontaneous pneumothorax that was treated conservatively. He was again readmitted in August 2018 for lower GI bleed (diverticular). During this admission he was noted to be in acute hypoxic respiratory failure secondary to diastolic heart failure. He improved with diuresis.  He had coronary artery bypass grafting in 1998. He had an endoluminal stent graft for abdominal aortic aneurysm in [place] 10/09 . Pre-op cath showed patent grafts.  Doppler performed 2/18 revealed aortic dimensions of approximately 4.4 x 4.6 cm.   Has been followed by Dr. Vaughan Browner. Smoked a little bit in remote past but quit a long time ago. No h/o connective tissue disease. Wears BIPAP for severe OSA. Has been on home O2 since 2017. Dr. Vaughan Browner managing  BIPAP   BiPAP download December 2017-January 2018. AHI 7.2  6MW 02/2018: 820 feet, O2 sats 89-92% on 2L, HR 98-123  Echo 11/19: EF 60-65% RV mild to moderately reduced. RVSP 69mmHG Mild AS  Last year was found to be in new onset AF. AC not started due to recent diverticular bleed (this is 3rd time)   Admitted 11/22/19 with A/C respiratory failure. Placed on midodrine for soft  BP prior to discharge. Discharged to home on 11/25/19 on  to lasix 40 mg and 3 liters continuous oxygen. Discharge weight was 192 pounds.   Today he returns for HF follow up.Overall feeling ok. Started developing lower edema and dyspnea with exertion since discharge. Denies PND/Orthopnea. Not moving around much at home. Follows low salt diet. Appetite ok. No fever or chills. Weight at home 188-190  pounds. Taking all medications but did not take lasix prior to his visit. Lives with his wife.    RHC 12/11/17 RA = 6 RV = 72/7 PA = 73/27 (44) PCW = 10 Fick cardiac output/index = 4.1/2.1 PVR = 8.4 WU Ao sat = 91% PA sat = 58%, 58%  ECHO 2021  EF >75% grade IDD, RV moderately reduced. Moderate aortic stenosis.   Echo 11/18 EF 60-65% grade I DD  RV severely dilated moderate TR RVSP 53mmHG   VQ 12/18 Normal perfusion lung scan.  Irregular peripheral ventilation in both lungs consistent with chronic parenchymal lung disease changes.  PFTs 02/28/16 FVC 3.37 (116%), FEV1 2.08 (108%), F/F 62 Mild obstructive airway disease, no broncho-dilator response  CT scan 07/15/15- 2 .3 x 1.7 cm right upper lobe calcified opacity likely to represent pleural , parenchymal scarring. Mild centrilobular, paraseptal emphysema. Subpleural reticulation, fibrosis representing mild chronic ILD High-resolution CT 09/04/16 - stable right upper lobe scarring, hyper aerated lungs. Minimal interstitial lung disease and peripheral unchanged compared to 2016.  Hi-res CT: 01/06/18: Stable ILD   Review  of systems complete and found to be negative unless listed in HPI.   Past Medical History:  Diagnosis Date  . AAA (abdominal aortic aneurysm) ([place])   . Acute respiratory failure ([place]) 11/23/2019  . Asthma   . Basal cell carcinoma   . Chronic respiratory failure ([place])   . COPD (chronic obstructive pulmonary disease) ([place])   . Coronary artery disease   . History of chronic respiratory failure   . Hyperlipidemia   .  Hypothyroidism   . Right heart failure ([place])   . Skin cancer 2017  . Sleep apnea     Current Outpatient Medications  Medication Sig Dispense Refill  . albuterol (PROVENTIL HFA;VENTOLIN HFA) 108 (90 Base) MCG/ACT inhaler Inhale 1 puff into the lungs 2 (two) times daily. 1 Inhaler 2  . aspirin (ASPIRIN 81) 81 MG chewable tablet Chew 1 tablet (81 mg total) by mouth daily. Restart in 1 week following hospitalization    . fluticasone (VERAMYST) 27.5 MCG/SPRAY nasal spray Place 1 spray into the nose daily.    . Fluticasone-Salmeterol 113-14 MCG/ACT AEPB INHALE 1 PUFF TWICE A DAY 1 each 5  . furosemide (LASIX) 40 MG tablet Take 40 mg by mouth 2 (two) times daily.    Marland Kitchen levothyroxine (SYNTHROID, LEVOTHROID) 112 MCG tablet Take 112 mcg by mouth daily before breakfast.    . loratadine (CLARITIN) 10 MG tablet Take 10 mg by mouth daily.    . midodrine (PROAMATINE) 5 MG tablet Take 1 tablet (5 mg total) by mouth 3 (three) times daily with meals. 90 tablet 0  . montelukast (SINGULAIR) 10 MG tablet TAKE ONE TABLET BY MOUTH AT BEDTIME 30 tablet 2  . pantoprazole (PROTONIX) 40 MG tablet Take 1 tablet (40 mg total) by mouth 2 (two) times daily. 60 tablet 0  . sildenafil (REVATIO) 20 MG tablet Take 2 tablets (40 mg total) by mouth 3 (three) times daily. 540 tablet 3  . simvastatin (ZOCOR) 10 MG tablet Take 10 mg by mouth at bedtime.  3  . macitentan (OPSUMIT) 10 MG tablet Take 1 tablet (10 mg total) by mouth daily. 30 tablet 0   No current facility-administered medications for this encounter.    No Known Allergies    Social History   Socioeconomic History  . Marital status: Married    Spouse name: Not on file  . Number of children: Not on file  . Years of education: Not on file  . Highest education level: Not on file  Occupational History  . Not on file  Tobacco Use  . Smoking status: Former Smoker    Packs/day: 0.50    Quit date: 08/06/1961    Years since quitting: 58.3  . Smokeless tobacco:  Never Used  Substance and Sexual Activity  . Alcohol use: Not Currently  . Drug use: Never  . Sexual activity: Not on file  Other Topics Concern  . Not on file  Social History Narrative  . Not on file   Social Determinants of Health   Financial Resource Strain:   . Difficulty of Paying Living Expenses: Not on file  Food Insecurity:   . Worried About Charity fundraiser in the Last Year: Not on file  . Ran Out of Food in the Last Year: Not on file  Transportation Needs:   . Lack of Transportation (Medical): Not on file  . Lack of Transportation (Non-Medical): Not on file  Physical Activity:   . Days of Exercise per Week: Not on file  .  Minutes of Exercise per Session: Not on file  Stress:   . Feeling of Stress : Not on file  Social Connections:   . Frequency of Communication with Friends and Family: Not on file  . Frequency of Social Gatherings with Friends and Family: Not on file  . Attends Religious Services: Not on file  . Active Member of Clubs or Organizations: Not on file  . Attends Archivist Meetings: Not on file  . Marital Status: Not on file  Intimate Partner Violence:   . Fear of Current or Ex-Partner: Not on file  . Emotionally Abused: Not on file  . Physically Abused: Not on file  . Sexually Abused: Not on file      Family History  Problem Relation Age of Onset  . Emphysema Mother   . Stroke Father   . Movement disorder Sister   . Aneurysm Brother        Thoracic aorta  . Macular degeneration Brother     Vitals:   12/03/19 1339  BP: 124/78  Pulse: 99  SpO2: 92%  Weight: 85.6 kg   Wt Readings from Last 3 Encounters:  12/03/19 85.6 kg  11/25/19 87.1 kg  11/16/19 80.6 kg    PHYSICAL EXAM: General: Elderly. No resp difficulty. Arrived in a wheel chair.  HEENT: normal Neck: supple. JVP with prominent CV waves.  Carotids 2+ bilat; no bruits. No lymphadenopathy or thryomegaly appreciated. Cor: PMI nondisplaced. Irregular rate & rhythm.  No rubs, gallops. 2/6 AS. . Lungs: RLL crackles on 3 liters [place] Abdomen: soft, nontender, nondistended. No hepatosplenomegaly. No bruits or masses. Good bowel sounds. Extremities: no cyanosis, clubbing, rash, R and LLE 2+ edema Neuro: alert & orientedx3, cranial nerves grossly intact. moves all 4 extremities w/o difficulty. Affect pleasant   ASSESSMENT & PLAN:  1. Pulmonary hypertension with cor pulmonale, A/C Diastolic HF  - Echo, CT, PFTs and VQ studies reviewed personally - He has evidence of significant pulmonary HTN with severe RV failure. Etiology remains unclear.  - CT with evidence of ILD but PFTs not that bad. VQ negative.  - Suspect PH multifactorial but PAH out of proportion to LV pressures and ILD so suspect he must have component of Group I disease - RHC 2/19 c/w moderate PAH - Continue sildenafil without evidence of shunting. Continue 40 mg tid  Can cut back to 20 tid if needed due to GI side effects or hypotension.  -- Volume overloaded. Did not take lasix prior to the visit. Refer to [facility] for IV lasix. I personally called Glory Buff NP with [facility]. I am requesting 40 mg IV lasix for the next 2 days and then reassess.  - Continue compression stockings  - Continue Opsumit  - Check BMET today.   2. Chronic respiratory failure - evidence of mild ILD on CT. PFTs not too bad - has severe OSA on Bipap. Continue BiPAP qHS - continue supplemental O2 on 3 liters continuously. Sats stable.  - managed by Pulmonary. No change.   3. CAD s/p CABG - No s/s ischemia Myoview 1/19 normal.  - Managed by Dr. Gwenlyn Found.   4. Aortic stenosis - Mild-mod on ECHO 11/23/19  5. Atrial fibrillation, persistent - Rate controlled. Not candidate for [facility] due to recurrent LGIB  Greater than 50% of the (total minutes 40) visit spent in counseling/coordination of care regarding the coordination of care.     Darrick Grinder, NP  2:58 PM

## 2019-12-10 ENCOUNTER — Telehealth (INDEPENDENT_AMBULATORY_CARE_PROVIDER_SITE_OTHER): Payer: Medicare Other | Admitting: Cardiovascular Disease

## 2019-12-10 DIAGNOSIS — G4733 Obstructive sleep apnea (adult) (pediatric): Secondary | ICD-10-CM

## 2019-12-10 DIAGNOSIS — J9611 Chronic respiratory failure with hypoxia: Secondary | ICD-10-CM

## 2019-12-10 DIAGNOSIS — I350 Nonrheumatic aortic (valve) stenosis: Secondary | ICD-10-CM | POA: Diagnosis not present

## 2019-12-10 DIAGNOSIS — I4811 Longstanding persistent atrial fibrillation: Secondary | ICD-10-CM

## 2019-12-10 DIAGNOSIS — I714 Abdominal aortic aneurysm, without rupture, unspecified: Secondary | ICD-10-CM

## 2019-12-10 DIAGNOSIS — J9621 Acute and chronic respiratory failure with hypoxia: Secondary | ICD-10-CM | POA: Diagnosis not present

## 2019-12-10 DIAGNOSIS — Z951 Presence of aortocoronary bypass graft: Secondary | ICD-10-CM

## 2019-12-10 DIAGNOSIS — I25119 Atherosclerotic heart disease of native coronary artery with unspecified angina pectoris: Secondary | ICD-10-CM

## 2019-12-10 DIAGNOSIS — R079 Chest pain, unspecified: Secondary | ICD-10-CM

## 2019-12-10 NOTE — Progress Notes (Signed)
Virtual Visit via Telephone Note   This visit type was conducted due to national recommendations for restrictions regarding the COVID-19 Pandemic (e.g. social distancing) in an effort to limit this patient's exposure and mitigate transmission in our community.  Due to his co-morbid illnesses, this patient is at least at moderate risk for complications without adequate follow up.  This format is felt to be most appropriate for this patient at this time.  The patient did not have access to video technology/had technical difficulties with video requiring transitioning to audio format only (telephone).  All issues noted in this document were discussed and addressed.  No physical exam could be performed with this format.  Please refer to the patient's chart for his  consent to telehealth for [facility].   Date:  12/10/2019   ID:  Vincent Robinson, DOB 03/24/1923, MRN [id number]  Patient Location: Home Provider Location: Home  PCP:  Katherina Mires, MD  Cardiologist:  Quay Burow, MD  Electrophysiologist:  None   Evaluation Performed:  Follow-Up Visit  Chief Complaint: Follow-up CAD, aortic stenosis and pulmonary hypertension  History of Present Illness:    Vincent Robinson is a 86 y.o. married Caucasian male, father of 2, grandfather of 2 grandchildren originally from [place] who lives in [place] now be closer to his family.He is a Theatre stage manager. He was referred by Dr. Doreene Nest for cardiovascular evaluation because of increasing dyspnea on exertion thought not to be a pulmonary etiology.I last saw him in the office  08/04/2019. [place] have a history of hyperlipidemia. He's never had a heart attack or stroke. He had coronary artery bypass grafting 5-10 years ago with recath prior to his endoluminal stent graft 10/09 that showed patent grafts. He had an endoluminal stent graft for abdominal aortic aneurysm in [place] 10/09 with recent Doppler performed 2/18 revealed  aortic dimensions of approximately 4.4 x 4.6 cm. Recently had a spontaneous pneumothorax that was treated conservatively as well as a diverticular bleed requiring significant blood transfusion. He is currently on continuous nasal oxygen over the last 2 months. He was begun on a diuretic which improved his breathing. Since I saw him about a month ago we obtained a 2-D echo that showed normal LV systolic function, mild to moderate aortic stenosis with severe pulmonary hypertension. Abdominal Dopplers revealed a abdominal aorta measuring 2.9 cm. He does relate increasing dyspnea on exertion despite supplemental oxygen. He does have 1-2+ pitting edema. There is also had a history of spontaneous pneumothorax on the right hear good breath sounds bilaterally. I'm going to obtain a PA and lateral chest x-ray, double diuretics and obtain a CT rule out chronic PE as well as apharmacologicMyoview stress test to rule out an ischemic etiology which was performed 10/23/17 and was entirely normal.I'm referring him to the advanced heart failure clinic for further evaluation .  I referred him to Dr. Jeffie Pollock who performed a right heart cath confirming pulmonary hypertension and began him on Revatio which has resulted in improvement in his symptoms.  Since I saw him in the office 5 months ago he was admitted to [facility] 11/22/2019 and discharged home 3 days later for atypical chest pain.  His troponins were fairly low.  A 2D echocardiogram performed 11/23/2019 revealed normal LV systolic function with moderate aortic stenosis, valve area of 1.28 cm with a peak gradient of 36 mmHg and severe pulmonary hypertension.  He did not think his symptoms were similar to his prebypass symptoms.  Medical therapy  was recommended.  He does have a history of atrial fibrillation but has not been on anticoagulation because of bleeding risk.  He is on chronic O2 and BiPAP at night. He is on Revatio  for his pulmonary hypertension  and sees Dr. Haroldine Laws for this.  He currently is in A. fib with controlled ventricular spots but is not a candidate for oral anticoagulation because of his age, comorbidities and recent rectal bleeding.  Since I saw him in the office 5 months ago he was admitted to the hospital on 11/22/2019 and discharged home on 11/25/2019 for chest pain.  He had minimal troponin leak and was seen by Dr. Harrell Gave and discharged home without further evaluation.  He has had no recurrent chest pain.  He did see Darrick Grinder, NP in the office on 12/03/2019 with lower extremity edema and volume overload.  She arranged outpatient IV Lasix for several days and has been placed on metolazone.  He still has lower extremity edema.  He is on continuous nasal prong O2 3 L.   The patient does not have symptoms concerning for COVID-19 infection (fever, chills, cough, or new shortness of breath).    Past Medical History:  Diagnosis Date  . AAA (abdominal aortic aneurysm) ([place])   . Acute respiratory failure ([place]) 11/23/2019  . Asthma   . Basal cell carcinoma   . Chronic respiratory failure ([place])   . COPD (chronic obstructive pulmonary disease) ([place])   . Coronary artery disease   . History of chronic respiratory failure   . Hyperlipidemia   . Hypothyroidism   . Right heart failure ([place])   . Skin cancer 2017  . Sleep apnea    Past Surgical History:  Procedure Laterality Date  . bleeding ulcer  2016  . BYPASS AXILLA/BRACHIAL ARTERY  1998  . colon bleed  2012  . COLONOSCOPY N/A 06/07/2017   Procedure: COLONOSCOPY;  Surgeon: Carol Ada, MD;  Location: [facility];  Service: Endoscopy;  Laterality: N/A;  . DEBRIDMENT OF DECUBITUS ULCER  2002  . RIGHT HEART CATH N/A 12/11/2017   Procedure: RIGHT HEART CATH;  Surgeon: Jolaine Artist, MD;  Location: [place] CV LAB;  Service: Cardiovascular;  Laterality: N/A;  . THORACIC AORTA STENT  2009     No outpatient medications have been marked as taking for the 12/10/19  encounter (Appointment) with Lorretta Harp, MD.     Allergies:   Patient has no known allergies.   Social History   Tobacco Use  . Smoking status: Former Smoker    Packs/day: 0.50    Quit date: 08/06/1961    Years since quitting: 58.3  . Smokeless tobacco: Never Used  Substance Use Topics  . Alcohol use: Not Currently  . Drug use: Never     Family Hx: The patient's family history includes Aneurysm in his brother; Emphysema in his mother; Macular degeneration in his brother; Movement disorder in his sister; Stroke in his father.  ROS:   Please see the history of present illness.     All other systems reviewed and are negative.   Prior CV studies:   The following studies were reviewed today:  2D echocardiogram performed 11/23/2019  Labs/Other Tests and Data Reviewed:    EKG:  No ECG reviewed.  Recent Labs: 07/12/2019: ALT 15 11/22/2019: Magnesium 2.8 11/24/2019: Hemoglobin 9.8; Platelets 329 12/03/2019: B Natriuretic Peptide 398.6; BUN 17; Creatinine, Ser 1.27; Potassium 3.5; Sodium 135   Recent Lipid Panel No results found for: CHOL, TRIG,  HDL, CHOLHDL, LDLCALC, LDLDIRECT  Wt Readings from Last 3 Encounters:  12/03/19 188 lb 12.8 oz (85.6 kg)  11/25/19 192 lb 0.3 oz (87.1 kg)  11/16/19 177 lb 12.8 oz (80.6 kg)     Objective:    Vital Signs:  There were no vitals taken for this visit.   VITAL SIGNS:  reviewed the patient was unable to obtain his vital signs at home.  A complete physical exam was not performed since was this was a virtual telemedicine phone visit  ASSESSMENT & PLAN:    1. Coronary artery disease-history of CAD status post CABG x5 in 1998.  He underwent recatheterization prior to an endoluminal stent graft in [place] 10/09 revealing patent grafts.  He did have a negative Myoview 10/23/2017 and is denying chest pain until the recent admission 11/22/2019 during which his troponins were essentially negative. 2. Hyperlipidemia-on simvastatin followed by  his PCP 3. Pulmonary hypertension-on Revatio  followed by Dr. Haroldine Laws. 4. Atrial fibrillation-rate controlled not on oral anticoagulation because of bleeding risk 5. Aortic stenosis-2D echo performed 11/23/2019 revealed moderate aortic stenosis with a valve area of 1.28 cm with a peak gradient of 36 mmHg 6. Obstructive sleep apnea-on BiPAP  COVID-19 Education: The signs and symptoms of COVID-19 were discussed with the patient and how to seek care for testing (follow up with PCP or arrange E-visit).  The importance of social distancing was discussed today.  Time:   Today, I have spent 6 minutes with the patient with telehealth technology discussing the above problems.     Medication Adjustments/Labs and Tests Ordered: Current medicines are reviewed at length with the patient today.  Concerns regarding medicines are outlined above.   Tests Ordered: No orders of the defined types were placed in this encounter.   Medication Changes: No orders of the defined types were placed in this encounter.   Follow Up:  In Person in 6 month(s)  Signed, Quay Burow, MD  12/10/2019 10:24 AM    [place]

## 2019-12-10 NOTE — Patient Instructions (Signed)
Medication Instructions:  Your physician recommends that you continue on your current medications as directed. Please refer to the Current Medication list given to you today.  *If you need a refill on your cardiac medications before your next appointment, please call your pharmacy*  Lab Work: NONE   Testing/Procedures: NONE   Follow-Up: At [facility], you and your health needs are our priority.  As part of our continuing mission to provide you with exceptional heart care, we have created designated Provider Care Teams.  These Care Teams include your primary Cardiologist (physician) and Advanced Practice Providers (APPs -  Physician Assistants and Nurse Practitioners) who all work together to provide you with the care you need, when you need it.  Your next appointment:   6 month(s)  The format for your next appointment:   In Person  Provider:   You may see Quay Burow, MD or one of the following Advanced Practice Providers on your designated Care Team:    Kerin Ransom, PA-C  [place], [place]  Coletta Memos, [place]   Other Instructions  [place]

## 2019-12-16 ENCOUNTER — Other Ambulatory Visit: Payer: Self-pay | Admitting: Cardiovascular Disease

## 2019-12-17 ENCOUNTER — Encounter (HOSPITAL_COMMUNITY): Payer: Medicare Other

## 2019-12-17 ENCOUNTER — Inpatient Hospital Stay (HOSPITAL_COMMUNITY)
Admission: EM | Admit: 2019-12-17 | Discharge: 2019-12-23 | DRG: 291 | Disposition: A | Discharge status: Home Health | Payer: Medicare Other | Attending: Internal Medicine | Admitting: Internal Medicine

## 2019-12-17 ENCOUNTER — Emergency Department (HOSPITAL_COMMUNITY): Payer: Medicare Other

## 2019-12-17 DIAGNOSIS — N1831 Chronic kidney disease, stage 3a: Secondary | ICD-10-CM | POA: Diagnosis present

## 2019-12-17 DIAGNOSIS — I130 Hypertensive heart and chronic kidney disease with heart failure and stage 1 through stage 4 chronic kidney disease, or unspecified chronic kidney disease: Secondary | ICD-10-CM | POA: Diagnosis not present

## 2019-12-17 DIAGNOSIS — I350 Nonrheumatic aortic (valve) stenosis: Secondary | ICD-10-CM | POA: Diagnosis present

## 2019-12-17 DIAGNOSIS — J9691 Respiratory failure, unspecified with hypoxia: Secondary | ICD-10-CM

## 2019-12-17 DIAGNOSIS — E785 Hyperlipidemia, unspecified: Secondary | ICD-10-CM | POA: Diagnosis present

## 2019-12-17 DIAGNOSIS — J9621 Acute and chronic respiratory failure with hypoxia: Secondary | ICD-10-CM | POA: Diagnosis present

## 2019-12-17 DIAGNOSIS — I2721 Secondary pulmonary arterial hypertension: Secondary | ICD-10-CM | POA: Diagnosis present

## 2019-12-17 DIAGNOSIS — J9601 Acute respiratory failure with hypoxia: Secondary | ICD-10-CM | POA: Diagnosis present

## 2019-12-17 DIAGNOSIS — I50813 Acute on chronic right heart failure: Secondary | ICD-10-CM

## 2019-12-17 DIAGNOSIS — Z7951 Long term (current) use of inhaled steroids: Secondary | ICD-10-CM

## 2019-12-17 DIAGNOSIS — I712 Thoracic aortic aneurysm, without rupture: Secondary | ICD-10-CM | POA: Diagnosis present

## 2019-12-17 DIAGNOSIS — J849 Interstitial pulmonary disease, unspecified: Secondary | ICD-10-CM | POA: Diagnosis present

## 2019-12-17 DIAGNOSIS — Z20822 Contact with and (suspected) exposure to covid-19: Secondary | ICD-10-CM | POA: Diagnosis present

## 2019-12-17 DIAGNOSIS — Z79890 Hormone replacement therapy (postmenopausal): Secondary | ICD-10-CM

## 2019-12-17 DIAGNOSIS — I5082 Biventricular heart failure: Secondary | ICD-10-CM | POA: Diagnosis present

## 2019-12-17 DIAGNOSIS — Z85828 Personal history of other malignant neoplasm of skin: Secondary | ICD-10-CM

## 2019-12-17 DIAGNOSIS — I714 Abdominal aortic aneurysm, without rupture: Secondary | ICD-10-CM | POA: Diagnosis present

## 2019-12-17 DIAGNOSIS — E876 Hypokalemia: Secondary | ICD-10-CM | POA: Diagnosis present

## 2019-12-17 DIAGNOSIS — N179 Acute kidney failure, unspecified: Secondary | ICD-10-CM | POA: Diagnosis present

## 2019-12-17 DIAGNOSIS — I4891 Unspecified atrial fibrillation: Secondary | ICD-10-CM | POA: Diagnosis present

## 2019-12-17 DIAGNOSIS — I2510 Atherosclerotic heart disease of native coronary artery without angina pectoris: Secondary | ICD-10-CM | POA: Diagnosis present

## 2019-12-17 DIAGNOSIS — Z9981 Dependence on supplemental oxygen: Secondary | ICD-10-CM

## 2019-12-17 DIAGNOSIS — Z7982 Long term (current) use of aspirin: Secondary | ICD-10-CM

## 2019-12-17 DIAGNOSIS — Z951 Presence of aortocoronary bypass graft: Secondary | ICD-10-CM

## 2019-12-17 DIAGNOSIS — I272 Pulmonary hypertension, unspecified: Secondary | ICD-10-CM | POA: Diagnosis present

## 2019-12-17 DIAGNOSIS — R0600 Dyspnea, unspecified: Secondary | ICD-10-CM

## 2019-12-17 DIAGNOSIS — D509 Iron deficiency anemia, unspecified: Secondary | ICD-10-CM | POA: Diagnosis present

## 2019-12-17 DIAGNOSIS — I5033 Acute on chronic diastolic (congestive) heart failure: Secondary | ICD-10-CM | POA: Diagnosis present

## 2019-12-17 DIAGNOSIS — Z87891 Personal history of nicotine dependence: Secondary | ICD-10-CM

## 2019-12-17 DIAGNOSIS — J449 Chronic obstructive pulmonary disease, unspecified: Secondary | ICD-10-CM | POA: Diagnosis present

## 2019-12-17 DIAGNOSIS — E039 Hypothyroidism, unspecified: Secondary | ICD-10-CM | POA: Diagnosis present

## 2019-12-17 DIAGNOSIS — I2781 Cor pulmonale (chronic): Secondary | ICD-10-CM | POA: Diagnosis present

## 2019-12-17 DIAGNOSIS — R0602 Shortness of breath: Secondary | ICD-10-CM | POA: Diagnosis not present

## 2019-12-17 DIAGNOSIS — T502X5A Adverse effect of carbonic-anhydrase inhibitors, benzothiadiazides and other diuretics, initial encounter: Secondary | ICD-10-CM | POA: Diagnosis present

## 2019-12-17 DIAGNOSIS — I9589 Other hypotension: Secondary | ICD-10-CM | POA: Diagnosis present

## 2019-12-17 DIAGNOSIS — D638 Anemia in other chronic diseases classified elsewhere: Secondary | ICD-10-CM | POA: Diagnosis present

## 2019-12-17 DIAGNOSIS — I959 Hypotension, unspecified: Secondary | ICD-10-CM

## 2019-12-17 DIAGNOSIS — I4821 Permanent atrial fibrillation: Secondary | ICD-10-CM | POA: Diagnosis present

## 2019-12-17 DIAGNOSIS — Z79899 Other long term (current) drug therapy: Secondary | ICD-10-CM

## 2019-12-17 NOTE — ED Triage Notes (Signed)
Pt arrives via [facility] from home.   Pt states that he became tight arcoss the chest and increasingly SOB at home. Pt states he had bronchitis and is on his second round of antibiotics.  With EMS pt spo2% 60 on 4L, and 94% on 6L.

## 2019-12-17 NOTE — ED Provider Notes (Signed)
[facility] EMERGENCY DEPARTMENT Provider Note   CSN: [id number] Arrival date & time: 12/17/19  2305     History Chief Complaint  Patient presents with  . Shortness of Breath    Vincent Robinson is a 86 y.o. male.  Patient presents to the emergency department for evaluation of difficulty breathing.  Patient reports that he has been having trouble for more than a month.  He was treated with a course of antibiotics for "bronchitis, finished antibiotics approximately 2 to 3 weeks ago.  He has still had cough that is productive of green sputum.  He has noticed progressively worsening shortness of breath, especially today.  He reports that he has had difficulty walking around his home tonight because he gets very short of breath and tight across his chest with minimal exertion.  Patient normally on 2 L of nasal cannula oxygen, has recently increased to 3.  EMS report that his oxygen saturations were 60% on 4 L upon arrival to his home.  Oxygen was increased during transport with improvement.  Patient also reports that he has had substantial leg swelling over the last couple of weeks.  Home health has been visiting him and wrapping his legs.        Past Medical History:  Diagnosis Date  . AAA (abdominal aortic aneurysm) ([place])   . Acute respiratory failure ([place]) 11/23/2019  . Asthma   . Basal cell carcinoma   . Chronic respiratory failure ([place])   . COPD (chronic obstructive pulmonary disease) ([place])   . Coronary artery disease   . History of chronic respiratory failure   . Hyperlipidemia   . Hypothyroidism   . Right heart failure ([place])   . Skin cancer 2017  . Sleep apnea     Patient Active Problem List   Diagnosis Date Noted  . Pressure injury of skin 11/24/2019  . Chronic respiratory failure ([place])   . Acute on chronic respiratory failure ([place]) 11/23/2019  . Right heart failure ([place])   . S/P CABG (coronary artery bypass graft) 11/22/2019  . Chest pain 11/22/2019   . Atrial fibrillation ([place]) 08/04/2019  . Pulmonary hypertension, unspecified ([place]) 10/11/2017  . Aortic stenosis, moderate 10/11/2017  . Coronary artery disease 08/30/2017  . AAA (abdominal aortic aneurysm) without rupture ([place]) 08/30/2017  . Acute blood loss anemia 06/07/2017  . Hemorrhagic shock ([place]) 06/07/2017  . Lower GI bleed 06/05/2017  . Sleep apnea 06/05/2017  . Hypothyroidism 06/05/2017  . Hyperlipidemia 06/05/2017  . Asthma 06/05/2017  . ILD (interstitial lung disease) ([place]) 05/28/2017  . Pneumothorax on right 05/20/2017  . COPD (chronic obstructive pulmonary disease) ([place]) 05/20/2017  . Pneumothorax 05/20/2017  . Skin cancer 10/23/2015    Past Surgical History:  Procedure Laterality Date  . bleeding ulcer  2016  . BYPASS AXILLA/BRACHIAL ARTERY  1998  . colon bleed  2012  . COLONOSCOPY N/A 06/07/2017   Procedure: COLONOSCOPY;  Surgeon: Carol Ada, MD;  [facility];  Service: Endoscopy;  Laterality: N/A;  . DEBRIDMENT OF DECUBITUS ULCER  2002  . RIGHT HEART CATH N/A 12/11/2017   Procedure: RIGHT HEART CATH;  Surgeon: Jolaine Artist, MD;  Location: [place] CV LAB;  Service: Cardiovascular;  Laterality: N/A;  . THORACIC AORTA STENT  2009       Family History  Problem Relation Age of Onset  . Emphysema Mother   . Stroke Father   . Movement disorder Sister   . Aneurysm Brother  Thoracic aorta  . Macular degeneration Brother     Social History   Tobacco Use  . Smoking status: Former Smoker    Packs/day: 0.50    Quit date: 08/06/1961    Years since quitting: 58.4  . Smokeless tobacco: Never Used  Substance Use Topics  . Alcohol use: Not Currently  . Drug use: Never    Home Medications Prior to Admission medications   Medication Sig Start Date End Date Taking? Authorizing Provider  albuterol (PROVENTIL HFA;VENTOLIN HFA) 108 (90 Base) MCG/ACT inhaler Inhale 1 puff into the lungs 2 (two) times daily. 05/20/17   Mannam, Hart Robinsons,  MD  aspirin (ASPIRIN 81) 81 MG chewable tablet Chew 1 tablet (81 mg total) by mouth daily. Restart in 1 week following hospitalization 07/14/19   [place] Donnamarie Poag, DO  fluticasone (VERAMYST) 27.5 MCG/SPRAY nasal spray Place 1 spray into the nose daily.    [provider]  Fluticasone-Salmeterol 113-14 MCG/ACT AEPB INHALE 1 PUFF TWICE A DAY 03/12/18   Mannam, Praveen, MD  furosemide (LASIX) 40 MG tablet Take 1 tablet (40 mg total) by mouth 2 (two) times daily. 12/16/19   Lorretta Harp, MD  levothyroxine (SYNTHROID, LEVOTHROID) 112 MCG tablet Take 112 mcg by mouth daily before breakfast.    [provider]  loratadine (CLARITIN) 10 MG tablet Take 10 mg by mouth daily.    [provider]  macitentan (OPSUMIT) 10 MG tablet Take 1 tablet (10 mg total) by mouth daily. 09/14/19   Bensimhon, Shaune Pascal, MD  midodrine (PROAMATINE) 5 MG tablet Take 1 tablet (5 mg total) by mouth 3 (three) times daily with meals. 11/25/19 12/25/19  Dahal, Marlowe Aschoff, MD  montelukast (SINGULAIR) 10 MG tablet TAKE ONE TABLET BY MOUTH AT BEDTIME 03/02/19   Mannam, Praveen, MD  pantoprazole (PROTONIX) 40 MG tablet Take 1 tablet (40 mg total) by mouth 2 (two) times daily. 11/25/19 12/25/19  Terrilee Croak, MD  sildenafil (REVATIO) 20 MG tablet Take 2 tablets (40 mg total) by mouth 3 (three) times daily. 10/21/19   Lyda Jester M, PA-C  simvastatin (ZOCOR) 10 MG tablet Take 10 mg by mouth at bedtime. 03/22/17   [provider]    Allergies    Patient has no known allergies.  Review of Systems   Review of Systems  Respiratory: Positive for cough and shortness of breath.   All other systems reviewed and are negative.   Physical Exam Updated Vital Signs BP (!) 84/50   Pulse 96   Temp 98.2 F (36.8 C)   Resp (!) 27   SpO2 94%   Physical Exam Vitals and nursing note reviewed.  Constitutional:      General: He is not in acute distress.    Appearance: Normal appearance. He is well-developed.    HENT:     Head: Normocephalic and atraumatic.     Right Ear: Hearing normal.     Left Ear: Hearing normal.     Nose: Nose normal.  Eyes:     Conjunctiva/sclera: Conjunctivae normal.     Pupils: Pupils are equal, round, and reactive to light.  Cardiovascular:     Rate and Rhythm: Regular rhythm. Tachycardia present.     Heart sounds: S1 normal and S2 normal. No murmur. No friction rub. No gallop.   Pulmonary:     Effort: Pulmonary effort is normal. No respiratory distress.     Breath sounds: Decreased breath sounds and rales present.  Chest:     Chest wall: No  tenderness.  Abdominal:     General: Bowel sounds are normal.     Palpations: Abdomen is soft.     Tenderness: There is no abdominal tenderness. There is no guarding or rebound. Negative signs include Murphy's sign and McBurney's sign.     Hernia: No hernia is present.  Musculoskeletal:        General: Normal range of motion.     Cervical back: Normal range of motion and neck supple.     Right lower leg: Edema present.     Left lower leg: Edema present.  Skin:    General: Skin is warm and dry.     Findings: No rash.  Neurological:     Mental Status: He is alert and oriented to person, place, and time.     GCS: GCS eye subscore is 4. GCS verbal subscore is 5. GCS motor subscore is 6.     Cranial Nerves: No cranial nerve deficit.     Sensory: No sensory deficit.     Coordination: Coordination normal.  Psychiatric:        Speech: Speech normal.        Behavior: Behavior normal.        Thought Content: Thought content normal.     ED Results / Procedures / Treatments   Labs (all labs ordered are listed, but only abnormal results are displayed) Labs Reviewed  CBC WITH DIFFERENTIAL/PLATELET - Abnormal; Notable for the following components:      Result Value   WBC 12.2 (*)    Hemoglobin 10.4 (*)    HCT 34.8 (*)    MCV 79.3 (*)    MCH 23.7 (*)    MCHC 29.9 (*)    RDW 22.3 (*)    Platelets 409 (*)    Neutro Abs  9.4 (*)    Monocytes Absolute 1.1 (*)    All other components within normal limits  BASIC METABOLIC PANEL - Abnormal; Notable for the following components:   Sodium 134 (*)    Potassium 3.2 (*)    CO2 21 (*)    Glucose, Bld 128 (*)    BUN 27 (*)    Creatinine, Ser 1.33 (*)    Calcium 8.6 (*)    GFR calc non Af Amer 45 (*)    GFR calc Af Amer 53 (*)    All other components within normal limits  BRAIN NATRIURETIC PEPTIDE - Abnormal; Notable for the following components:   B Natriuretic Peptide 670.0 (*)    All other components within normal limits  TROPONIN I (HIGH SENSITIVITY) - Abnormal; Notable for the following components:   Troponin I (High Sensitivity) 25 (*)    All other components within normal limits  POC SARS CORONAVIRUS 2 AG -  ED  TROPONIN I (HIGH SENSITIVITY)    EKG EKG Interpretation  Date/Time:  Thursday December 17 2019 23:17:04 EST Ventricular Rate:  114 PR Interval:    QRS Duration: 118 QT Interval:  370 QTC Calculation: 510 R Axis:   89 Text Interpretation: Atrial fibrillation Right bundle branch block Confirmed by Orpah Greek [phone]) on 12/17/2019 11:26:52 PM   Radiology DG Chest Port 1 View  Result Date: 12/17/2019 CLINICAL DATA:  Shortness of breath EXAM: PORTABLE CHEST 1 VIEW COMPARISON:  11/22/2019, 10/18/2017 FINDINGS: Post sternotomy changes. Diffuse coarse interstitial opacity consistent with chronic disease. No acute consolidation, pleural effusion or pneumothorax. Cardiomegaly with mild central congestion. IMPRESSION: 1. Cardiomegaly with mild central congestion. 2. Diffuse interstitial opacity consistent  with chronic disease. No acute airspace disease. Electronically Signed   By: Donavan Foil M.D.   On: 12/17/2019 23:40    Procedures Procedures (including critical care time)  Medications Ordered in ED Medications  furosemide (LASIX) injection 40 mg (40 mg Intravenous Given 12/18/19 0126)    ED Course  I have reviewed the triage  vital signs and the nursing notes.  Pertinent labs & imaging results that were available during my care of the patient were reviewed by me and considered in my medical decision making (see chart for details).    MDM Rules/Calculators/A&P                      Patient with history of COPD/interstitial lung disease, CAD and CHF presents to the ER for evaluation of shortness of breath.  He has been having increased difficulty for a month.  Patient reports increased cough.  He had negative Covid testing and was treated for bronchitis with antibiotics.  Patient reports persistent cough and shortness of breath, significantly worsening over the last 24 hours.  Patient was very hypoxic when first responders arrived at his home.  He is chronically oxygen dependent, requiring a significant increase in his oxygen to maintain his saturations.  Even on 6 L by nasal cannula, he significantly desaturates with minimal exertion such as standing at the bedside in his room.  Patient found to be tachycardic at arrival, appears to be in atrial fibrillation with a rapid ventricular response.  This is a new finding.  Tachycardia, however, is likely secondary to his increased work of breathing.  After providing increased supplemental oxygen, patient's heart rate has now self corrected.  He is still in atrial fibrillation but no longer exhibiting rapid ventricular response.  He does have an elevated ChadsVAsc score of 5, but does have a previous history of GI bleed.  He does have a slightly elevated troponin.  This is similar to previous troponin seen in the patient.  He has a history of pulmonary hypertension and severe dysfunction of the right ventricle.  Patient's blood pressure has been between 80 and [id number] systolic.  Reviewing his records from recent hospitalization reveals that this is a normal pressure for him.  He does have an elevated BNP, lower extremity swelling consistent with right heart failure, will provide  additional diuresis and will require hospitalization secondary to increased work of breathing and oxygen requirement.  CHA2DS2-VASc Score = 5 The patient's score is based upon: CHF History: Yes HTN History: No Age : 58 + Diabetes History: No Stroke History: No Vascular Disease History: Yes Gender: Male      ASSESSMENT AND PLAN: Paroxysmal Atrial Fibrillation (ICD10:  I48.0) The patient's CHA2DS2-VASc score is 5, indicating a 7.2% annual risk of stroke.    Secondary Hypercoagulable State (ICD10:  D68.69) The patient is at significant risk for stroke/thromboembolism based upon his CHA2DS2-VASc Score of 5.  However, the patient is not on anticoagulation due to his high bleeding risk.      Signed,  Orpah Greek, MD    12/18/2019 1:26 AM   Final Clinical Impression(s) / ED Diagnoses Final diagnoses:  Acute on chronic right-sided heart failure ([place])  New onset a-fib [facility])    Rx / DC Orders ED Discharge Orders    None       Harjot Dibello, Gwenyth Allegra, MD 12/18/19 0126

## 2019-12-18 ENCOUNTER — Encounter (HOSPITAL_COMMUNITY): Payer: Self-pay | Admitting: Internal Medicine

## 2019-12-18 ENCOUNTER — Inpatient Hospital Stay (HOSPITAL_COMMUNITY): Payer: Medicare Other

## 2019-12-18 ENCOUNTER — Other Ambulatory Visit: Payer: Self-pay

## 2019-12-18 ENCOUNTER — Ambulatory Visit: Payer: Medicare Other | Admitting: Pulmonary Disease

## 2019-12-18 DIAGNOSIS — T502X5A Adverse effect of carbonic-anhydrase inhibitors, benzothiadiazides and other diuretics, initial encounter: Secondary | ICD-10-CM | POA: Diagnosis present

## 2019-12-18 DIAGNOSIS — I350 Nonrheumatic aortic (valve) stenosis: Secondary | ICD-10-CM | POA: Diagnosis present

## 2019-12-18 DIAGNOSIS — Z85828 Personal history of other malignant neoplasm of skin: Secondary | ICD-10-CM | POA: Diagnosis not present

## 2019-12-18 DIAGNOSIS — I5082 Biventricular heart failure: Secondary | ICD-10-CM | POA: Diagnosis present

## 2019-12-18 DIAGNOSIS — I2510 Atherosclerotic heart disease of native coronary artery without angina pectoris: Secondary | ICD-10-CM | POA: Diagnosis present

## 2019-12-18 DIAGNOSIS — R609 Edema, unspecified: Secondary | ICD-10-CM | POA: Diagnosis not present

## 2019-12-18 DIAGNOSIS — E039 Hypothyroidism, unspecified: Secondary | ICD-10-CM | POA: Diagnosis present

## 2019-12-18 DIAGNOSIS — Z87891 Personal history of nicotine dependence: Secondary | ICD-10-CM | POA: Diagnosis not present

## 2019-12-18 DIAGNOSIS — Z7982 Long term (current) use of aspirin: Secondary | ICD-10-CM | POA: Diagnosis not present

## 2019-12-18 DIAGNOSIS — R0602 Shortness of breath: Secondary | ICD-10-CM | POA: Diagnosis present

## 2019-12-18 DIAGNOSIS — D649 Anemia, unspecified: Secondary | ICD-10-CM

## 2019-12-18 DIAGNOSIS — I50813 Acute on chronic right heart failure: Secondary | ICD-10-CM

## 2019-12-18 DIAGNOSIS — E876 Hypokalemia: Secondary | ICD-10-CM

## 2019-12-18 DIAGNOSIS — I130 Hypertensive heart and chronic kidney disease with heart failure and stage 1 through stage 4 chronic kidney disease, or unspecified chronic kidney disease: Secondary | ICD-10-CM | POA: Diagnosis present

## 2019-12-18 DIAGNOSIS — I712 Thoracic aortic aneurysm, without rupture: Secondary | ICD-10-CM | POA: Diagnosis present

## 2019-12-18 DIAGNOSIS — I4811 Longstanding persistent atrial fibrillation: Secondary | ICD-10-CM | POA: Diagnosis not present

## 2019-12-18 DIAGNOSIS — J9621 Acute and chronic respiratory failure with hypoxia: Secondary | ICD-10-CM | POA: Diagnosis present

## 2019-12-18 DIAGNOSIS — I9589 Other hypotension: Secondary | ICD-10-CM | POA: Diagnosis present

## 2019-12-18 DIAGNOSIS — J9601 Acute respiratory failure with hypoxia: Secondary | ICD-10-CM | POA: Diagnosis not present

## 2019-12-18 DIAGNOSIS — R778 Other specified abnormalities of plasma proteins: Secondary | ICD-10-CM

## 2019-12-18 DIAGNOSIS — Z79899 Other long term (current) drug therapy: Secondary | ICD-10-CM | POA: Diagnosis not present

## 2019-12-18 DIAGNOSIS — Z951 Presence of aortocoronary bypass graft: Secondary | ICD-10-CM

## 2019-12-18 DIAGNOSIS — N179 Acute kidney failure, unspecified: Secondary | ICD-10-CM

## 2019-12-18 DIAGNOSIS — J849 Interstitial pulmonary disease, unspecified: Secondary | ICD-10-CM | POA: Diagnosis present

## 2019-12-18 DIAGNOSIS — N189 Chronic kidney disease, unspecified: Secondary | ICD-10-CM

## 2019-12-18 DIAGNOSIS — I480 Paroxysmal atrial fibrillation: Secondary | ICD-10-CM

## 2019-12-18 DIAGNOSIS — Z20822 Contact with and (suspected) exposure to covid-19: Secondary | ICD-10-CM | POA: Diagnosis present

## 2019-12-18 DIAGNOSIS — Z7951 Long term (current) use of inhaled steroids: Secondary | ICD-10-CM | POA: Diagnosis not present

## 2019-12-18 DIAGNOSIS — I2721 Secondary pulmonary arterial hypertension: Secondary | ICD-10-CM | POA: Diagnosis not present

## 2019-12-18 DIAGNOSIS — E785 Hyperlipidemia, unspecified: Secondary | ICD-10-CM | POA: Diagnosis present

## 2019-12-18 DIAGNOSIS — I4821 Permanent atrial fibrillation: Secondary | ICD-10-CM | POA: Diagnosis present

## 2019-12-18 DIAGNOSIS — J449 Chronic obstructive pulmonary disease, unspecified: Secondary | ICD-10-CM | POA: Diagnosis present

## 2019-12-18 DIAGNOSIS — I5033 Acute on chronic diastolic (congestive) heart failure: Secondary | ICD-10-CM | POA: Diagnosis present

## 2019-12-18 DIAGNOSIS — Z79890 Hormone replacement therapy (postmenopausal): Secondary | ICD-10-CM | POA: Diagnosis not present

## 2019-12-18 LAB — BASIC METABOLIC PANEL
Anion gap: 15 (ref 5–15)
Anion gap: 13 (ref 5–15)
Anion gap: 10 (ref 5–15)
BUN: 27 mg/dL — ABNORMAL HIGH (ref 8–23)
BUN: 25 mg/dL — ABNORMAL HIGH (ref 8–23)
BUN: 27 mg/dL — ABNORMAL HIGH (ref 8–23)
CO2: 21 mmol/L — ABNORMAL LOW (ref 22–32)
CO2: 28 mmol/L (ref 22–32)
CO2: 26 mmol/L (ref 22–32)
Calcium: 8.6 mg/dL — ABNORMAL LOW (ref 8.9–10.3)
Calcium: 8.7 mg/dL — ABNORMAL LOW (ref 8.9–10.3)
Calcium: 8.2 mg/dL — ABNORMAL LOW (ref 8.9–10.3)
Chloride: 98 mmol/L (ref 98–111)
Chloride: 94 mmol/L — ABNORMAL LOW (ref 98–111)
Chloride: 93 mmol/L — ABNORMAL LOW (ref 98–111)
Creatinine, Ser: 1.33 mg/dL — ABNORMAL HIGH (ref 0.61–1.24)
Creatinine, Ser: 1.38 mg/dL — ABNORMAL HIGH (ref 0.61–1.24)
Creatinine, Ser: 1.4 mg/dL — ABNORMAL HIGH (ref 0.61–1.24)
GFR calc Af Amer: 53 mL/min — ABNORMAL LOW (ref >60)
GFR calc Af Amer: 50 mL/min — ABNORMAL LOW (ref >60)
GFR calc Af Amer: 49 mL/min — ABNORMAL LOW (ref >60)
GFR calc non Af Amer: 45 mL/min — ABNORMAL LOW (ref >60)
GFR calc non Af Amer: 43 mL/min — ABNORMAL LOW (ref >60)
GFR calc non Af Amer: 43 mL/min — ABNORMAL LOW (ref >60)
Glucose, Bld: 128 mg/dL — ABNORMAL HIGH (ref 70–99)
Glucose, Bld: 101 mg/dL — ABNORMAL HIGH (ref 70–99)
Glucose, Bld: 122 mg/dL — ABNORMAL HIGH (ref 70–99)
Potassium: 3.2 mmol/L — ABNORMAL LOW (ref 3.5–5.1)
Potassium: 2.7 mmol/L — CL (ref 3.5–5.1)
Potassium: 3.2 mmol/L — ABNORMAL LOW (ref 3.5–5.1)
Sodium: 134 mmol/L — ABNORMAL LOW (ref 135–145)
Sodium: 135 mmol/L (ref 135–145)
Sodium: 129 mmol/L — ABNORMAL LOW (ref 135–145)

## 2019-12-18 LAB — CBC WITH DIFFERENTIAL/PLATELET
Abs Immature Granulocytes: 0.06 10*3/uL (ref 0.00–0.07)
Abs Immature Granulocytes: 0.05 10*3/uL (ref 0.00–0.07)
Basophils Absolute: 0.1 10*3/uL (ref 0.0–0.1)
Basophils Absolute: 0.1 10*3/uL (ref 0.0–0.1)
Basophils Relative: 1 %
Basophils Relative: 1 %
Eosinophils Absolute: 0 10*3/uL (ref 0.0–0.5)
Eosinophils Absolute: 0.1 10*3/uL (ref 0.0–0.5)
Eosinophils Relative: 0 %
Eosinophils Relative: 1 %
HCT: 34.8 % — ABNORMAL LOW (ref 39.0–52.0)
HCT: 32.4 % — ABNORMAL LOW (ref 39.0–52.0)
Hemoglobin: 10.4 g/dL — ABNORMAL LOW (ref 13.0–17.0)
Hemoglobin: 9.9 g/dL — ABNORMAL LOW (ref 13.0–17.0)
Immature Granulocytes: 1 %
Immature Granulocytes: 1 %
Lymphocytes Relative: 13 %
Lymphocytes Relative: 20 %
Lymphs Abs: 1.5 10*3/uL (ref 0.7–4.0)
Lymphs Abs: 1.9 10*3/uL (ref 0.7–4.0)
MCH: 23.7 pg — ABNORMAL LOW (ref 26.0–34.0)
MCH: 24.1 pg — ABNORMAL LOW (ref 26.0–34.0)
MCHC: 29.9 g/dL — ABNORMAL LOW (ref 30.0–36.0)
MCHC: 30.6 g/dL (ref 30.0–36.0)
MCV: 79.3 fL — ABNORMAL LOW (ref 80.0–100.0)
MCV: 78.8 fL — ABNORMAL LOW (ref 80.0–100.0)
Monocytes Absolute: 1.1 10*3/uL — ABNORMAL HIGH (ref 0.1–1.0)
Monocytes Absolute: 1.1 10*3/uL — ABNORMAL HIGH (ref 0.1–1.0)
Monocytes Relative: 9 %
Monocytes Relative: 12 %
Neutro Abs: 9.4 10*3/uL — ABNORMAL HIGH (ref 1.7–7.7)
Neutro Abs: 6.5 10*3/uL (ref 1.7–7.7)
Neutrophils Relative %: 76 %
Neutrophils Relative %: 65 %
Platelets: 409 10*3/uL — ABNORMAL HIGH (ref 150–400)
Platelets: 357 10*3/uL (ref 150–400)
RBC: 4.39 MIL/uL (ref 4.22–5.81)
RBC: 4.11 MIL/uL — ABNORMAL LOW (ref 4.22–5.81)
RDW: 22.3 % — ABNORMAL HIGH (ref 11.5–15.5)
RDW: 22.1 % — ABNORMAL HIGH (ref 11.5–15.5)
WBC: 12.2 10*3/uL — ABNORMAL HIGH (ref 4.0–10.5)
WBC: 9.7 10*3/uL (ref 4.0–10.5)
nRBC: 0 % (ref 0.0–0.2)
nRBC: 0 % (ref 0.0–0.2)

## 2019-12-18 LAB — BLOOD GAS, ARTERIAL
Acid-Base Excess: 4.1 mmol/L — ABNORMAL HIGH (ref 0.0–2.0)
Bicarbonate: 27.8 mmol/L (ref 20.0–28.0)
Drawn by: 519031
FIO2: 48
O2 Saturation: 93.8 %
Patient temperature: 36.5
pCO2 arterial: 38.8 mmHg (ref 32.0–48.0)
pH, Arterial: 7.467 — ABNORMAL HIGH (ref 7.350–7.450)
pO2, Arterial: 70.3 mmHg — ABNORMAL LOW (ref 83.0–108.0)

## 2019-12-18 LAB — VITAMIN B12: Vitamin B-12: 266 pg/mL (ref 180–914)

## 2019-12-18 LAB — PROCALCITONIN: Procalcitonin: <0.1 ng/mL

## 2019-12-18 LAB — TROPONIN I (HIGH SENSITIVITY)
Troponin I (High Sensitivity): 25 ng/L — ABNORMAL HIGH (ref <18)
Troponin I (High Sensitivity): 40 ng/L — ABNORMAL HIGH (ref <18)
Troponin I (High Sensitivity): 41 ng/L — ABNORMAL HIGH (ref <18)
Troponin I (High Sensitivity): 41 ng/L — ABNORMAL HIGH (ref <18)

## 2019-12-18 LAB — RETICULOCYTES
Immature Retic Fract: 28.6 % — ABNORMAL HIGH (ref 2.3–15.9)
RBC.: 4.02 MIL/uL — ABNORMAL LOW (ref 4.22–5.81)
Retic Count, Absolute: 74.4 10*3/uL (ref 19.0–186.0)
Retic Ct Pct: 1.9 % (ref 0.4–3.1)

## 2019-12-18 LAB — HEPATIC FUNCTION PANEL
ALT: 11 U/L (ref 0–44)
AST: 19 U/L (ref 15–41)
Albumin: 2.6 g/dL — ABNORMAL LOW (ref 3.5–5.0)
Alkaline Phosphatase: 67 U/L (ref 38–126)
Bilirubin, Direct: 0.2 mg/dL (ref 0.0–0.2)
Indirect Bilirubin: 0.4 mg/dL (ref 0.3–0.9)
Total Bilirubin: 0.6 mg/dL (ref 0.3–1.2)
Total Protein: 7 g/dL (ref 6.5–8.1)

## 2019-12-18 LAB — BRAIN NATRIURETIC PEPTIDE: B Natriuretic Peptide: 670 pg/mL — ABNORMAL HIGH (ref 0.0–100.0)

## 2019-12-18 LAB — IRON AND TIBC
Iron: 23 ug/dL — ABNORMAL LOW (ref 45–182)
Saturation Ratios: 7 % — ABNORMAL LOW (ref 17.9–39.5)
TIBC: 353 ug/dL (ref 250–450)
UIBC: 330 ug/dL

## 2019-12-18 LAB — TSH: TSH: 2.89 u[IU]/mL (ref 0.350–4.500)

## 2019-12-18 LAB — SARS CORONAVIRUS 2 (TAT 6-24 HRS): SARS Coronavirus 2: NEGATIVE

## 2019-12-18 LAB — FOLATE: Folate: 9.7 ng/mL (ref >5.9)

## 2019-12-18 LAB — LACTIC ACID, PLASMA
Lactic Acid, Venous: 1.1 mmol/L (ref 0.5–1.9)
Lactic Acid, Venous: 1.3 mmol/L (ref 0.5–1.9)

## 2019-12-18 LAB — MAGNESIUM: Magnesium: 1.8 mg/dL (ref 1.7–2.4)

## 2019-12-18 LAB — POC SARS CORONAVIRUS 2 AG -  ED: SARS Coronavirus 2 Ag: NEGATIVE

## 2019-12-18 LAB — FERRITIN: Ferritin: 15 ng/mL — ABNORMAL LOW (ref 24–336)

## 2019-12-18 MED ORDER — MAGNESIUM SULFATE 2 GM/50ML IV SOLN
2.0000 g | Freq: Once | INTRAVENOUS | Status: AC
  Administered 2019-12-18: 2 g via INTRAVENOUS
  Filled 2019-12-18: qty 50

## 2019-12-18 MED ORDER — IPRATROPIUM-ALBUTEROL 0.5-2.5 (3) MG/3ML IN SOLN
3.0000 mL | Freq: Four times a day (QID) | RESPIRATORY_TRACT | Status: DC
  Administered 2019-12-18: 3 mL via RESPIRATORY_TRACT
  Filled 2019-12-18 (×2): qty 3

## 2019-12-18 MED ORDER — POTASSIUM CHLORIDE CRYS ER 20 MEQ PO TBCR
20.0000 meq | EXTENDED_RELEASE_TABLET | Freq: Once | ORAL | Status: AC
  Administered 2019-12-18: 20 meq via ORAL
  Filled 2019-12-18: qty 1

## 2019-12-18 MED ORDER — LORATADINE 10 MG PO TABS
10.0000 mg | ORAL_TABLET | Freq: Every day | ORAL | Status: DC
  Administered 2019-12-18 – 2019-12-23 (×6): 10 mg via ORAL
  Filled 2019-12-18 (×6): qty 1

## 2019-12-18 MED ORDER — MACITENTAN 10 MG PO TABS
10.0000 mg | ORAL_TABLET | Freq: Every day | ORAL | Status: DC
  Administered 2019-12-19 – 2019-12-23 (×5): 10 mg via ORAL
  Filled 2019-12-18 (×6): qty 1

## 2019-12-18 MED ORDER — ACETAMINOPHEN 325 MG PO TABS: 650.0000 mg | ORAL_TABLET | Freq: Four times a day (QID) | ORAL | Status: DC | PRN

## 2019-12-18 MED ORDER — ACETAMINOPHEN 650 MG RE SUPP: 650.0000 mg | Freq: Four times a day (QID) | RECTAL | Status: DC | PRN

## 2019-12-18 MED ORDER — MIDODRINE HCL 5 MG PO TABS
5.0000 mg | ORAL_TABLET | Freq: Three times a day (TID) | ORAL | Status: DC
  Administered 2019-12-18 (×3): 5 mg via ORAL
  Filled 2019-12-18 (×3): qty 1

## 2019-12-18 MED ORDER — ALBUTEROL SULFATE (2.5 MG/3ML) 0.083% IN NEBU: 2.5000 mg | INHALATION_SOLUTION | Freq: Two times a day (BID) | RESPIRATORY_TRACT | Status: DC | PRN

## 2019-12-18 MED ORDER — ONDANSETRON HCL 4 MG PO TABS: 4.0000 mg | ORAL_TABLET | Freq: Four times a day (QID) | ORAL | Status: DC | PRN

## 2019-12-18 MED ORDER — SODIUM CHLORIDE 0.9 % IV SOLN
510.0000 mg | Freq: Once | INTRAVENOUS | Status: AC
  Administered 2019-12-18: 510 mg via INTRAVENOUS
  Filled 2019-12-18: qty 17

## 2019-12-18 MED ORDER — SODIUM CHLORIDE 0.9 % IV SOLN
100.0000 mg | Freq: Two times a day (BID) | INTRAVENOUS | Status: DC
  Administered 2019-12-18: 100 mg via INTRAVENOUS
  Filled 2019-12-18 (×2): qty 100

## 2019-12-18 MED ORDER — ALBUTEROL SULFATE (2.5 MG/3ML) 0.083% IN NEBU
2.5000 mg | INHALATION_SOLUTION | Freq: Two times a day (BID) | RESPIRATORY_TRACT | Status: DC
  Administered 2019-12-18: 2.5 mg via RESPIRATORY_TRACT
  Filled 2019-12-18: qty 3

## 2019-12-18 MED ORDER — FUROSEMIDE 10 MG/ML IJ SOLN
40.0000 mg | Freq: Two times a day (BID) | INTRAMUSCULAR | Status: DC
  Administered 2019-12-18: 40 mg via INTRAVENOUS
  Filled 2019-12-18: qty 4

## 2019-12-18 MED ORDER — POTASSIUM CHLORIDE CRYS ER 20 MEQ PO TBCR: 20.0000 meq | EXTENDED_RELEASE_TABLET | Freq: Every day | ORAL | Status: DC

## 2019-12-18 MED ORDER — POTASSIUM CHLORIDE CRYS ER 20 MEQ PO TBCR
40.0000 meq | EXTENDED_RELEASE_TABLET | ORAL | Status: AC
  Administered 2019-12-18 (×3): 40 meq via ORAL
  Filled 2019-12-18 (×3): qty 2

## 2019-12-18 MED ORDER — FUROSEMIDE 10 MG/ML IJ SOLN: 40.0000 mg | Freq: Every day | INTRAMUSCULAR | Status: DC

## 2019-12-18 MED ORDER — MONTELUKAST SODIUM 10 MG PO TABS
10.0000 mg | ORAL_TABLET | Freq: Every day | ORAL | Status: DC
  Administered 2019-12-18 – 2019-12-22 (×5): 10 mg via ORAL
  Filled 2019-12-18 (×5): qty 1

## 2019-12-18 MED ORDER — SILDENAFIL CITRATE 20 MG PO TABS
40.0000 mg | ORAL_TABLET | Freq: Three times a day (TID) | ORAL | Status: DC
  Administered 2019-12-18 – 2019-12-23 (×10): 40 mg via ORAL
  Filled 2019-12-18 (×18): qty 2

## 2019-12-18 MED ORDER — MIDODRINE HCL 5 MG PO TABS
10.0000 mg | ORAL_TABLET | Freq: Three times a day (TID) | ORAL | Status: DC
  Administered 2019-12-19 – 2019-12-23 (×15): 10 mg via ORAL
  Filled 2019-12-18 (×15): qty 2

## 2019-12-18 MED ORDER — ENOXAPARIN SODIUM 40 MG/0.4ML ~~LOC~~ SOLN
40.0000 mg | SUBCUTANEOUS | Status: DC
  Administered 2019-12-18: 40 mg via SUBCUTANEOUS
  Filled 2019-12-18: qty 0.4

## 2019-12-18 MED ORDER — FUROSEMIDE 10 MG/ML IJ SOLN
40.0000 mg | Freq: Once | INTRAMUSCULAR | Status: AC
  Administered 2019-12-18: 40 mg via INTRAVENOUS
  Filled 2019-12-18: qty 4

## 2019-12-18 MED ORDER — POTASSIUM CHLORIDE 20 MEQ/15ML (10%) PO SOLN
20.0000 meq | Freq: Two times a day (BID) | ORAL | Status: DC
  Administered 2019-12-18 – 2019-12-23 (×10): 20 meq via ORAL
  Filled 2019-12-18 (×10): qty 15

## 2019-12-18 MED ORDER — MOMETASONE FURO-FORMOTEROL FUM 200-5 MCG/ACT IN AERO
2.0000 | INHALATION_SPRAY | Freq: Two times a day (BID) | RESPIRATORY_TRACT | Status: DC
  Administered 2019-12-18 – 2019-12-23 (×11): 2 via RESPIRATORY_TRACT
  Filled 2019-12-18: qty 8.8

## 2019-12-18 MED ORDER — FLUTICASONE PROPIONATE 50 MCG/ACT NA SUSP
1.0000 | Freq: Every day | NASAL | Status: DC
  Filled 2019-12-18 (×2): qty 16

## 2019-12-18 MED ORDER — ASPIRIN 81 MG PO CHEW
81.0000 mg | CHEWABLE_TABLET | Freq: Every day | ORAL | Status: DC
  Administered 2019-12-18 – 2019-12-23 (×6): 81 mg via ORAL
  Filled 2019-12-18 (×6): qty 1

## 2019-12-18 MED ORDER — PANTOPRAZOLE SODIUM 40 MG PO TBEC
40.0000 mg | DELAYED_RELEASE_TABLET | Freq: Two times a day (BID) | ORAL | Status: DC
  Administered 2019-12-18 – 2019-12-23 (×11): 40 mg via ORAL
  Filled 2019-12-18 (×11): qty 1

## 2019-12-18 MED ORDER — LEVOTHYROXINE SODIUM 112 MCG PO TABS
112.0000 ug | ORAL_TABLET | Freq: Every day | ORAL | Status: DC
  Administered 2019-12-18 – 2019-12-23 (×6): 112 ug via ORAL
  Filled 2019-12-18 (×6): qty 1

## 2019-12-18 MED ORDER — SIMVASTATIN 20 MG PO TABS
10.0000 mg | ORAL_TABLET | Freq: Every day | ORAL | Status: DC
  Administered 2019-12-18 – 2019-12-22 (×5): 10 mg via ORAL
  Filled 2019-12-18 (×5): qty 1

## 2019-12-18 MED ORDER — SPIRONOLACTONE 12.5 MG HALF TABLET
12.5000 mg | ORAL_TABLET | Freq: Every day | ORAL | Status: DC
  Administered 2019-12-18 – 2019-12-23 (×6): 12.5 mg via ORAL
  Filled 2019-12-18 (×6): qty 1

## 2019-12-18 MED ORDER — ONDANSETRON HCL 4 MG/2ML IJ SOLN: 4.0000 mg | Freq: Four times a day (QID) | INTRAMUSCULAR | Status: DC | PRN

## 2019-12-18 MED ORDER — FUROSEMIDE 10 MG/ML IJ SOLN
40.0000 mg | Freq: Two times a day (BID) | INTRAMUSCULAR | Status: DC
  Administered 2019-12-18 – 2019-12-21 (×6): 40 mg via INTRAVENOUS
  Filled 2019-12-18 (×6): qty 4

## 2019-12-18 NOTE — Progress Notes (Addendum)
PROGRESS NOTE  Vincent Robinson [id number] DOB: May 20, 1923   PCP: Katherina Mires, MD  Patient is from: Home.  Lives with his wife.  Uses walker at baseline  DOA: 12/17/2019 LOS: 0  Brief Narrative / Interim history: 86 year old male with history of CAD/CABG, diastolic CHF, pulm HTN, COPD/chronic RF on 3 L, moderate AS, AAA s/p endovascular repair, A. fib not on anticoagulation due to GIB and hypertension presenting with increased shortness of breath and edema for 2 days, and 6 pound weight gain in about a week.  In ED, soft blood pressures.  Slightly tachycardic.  Desaturated to 88% requiring 6 L. Na 134.  K3.2.  Cr 1.3 (1.27 on 2/11).  BUN 27.  BNP 670 (higher than b/l).  HS Trop 25> 40.  Lactic acid negative.  WBC 12.2.  HTN 0.4.  CXR concerning for CHF.  COVID-19 antigen and PCR negative.  EKG with TW changes in inferior and anterior leads and RBBB.  Patient was admitted for acute on chronic respiratory failure with hypoxia due to diastolic CHF exacerbation.  Started on IV Lasix 40 mg twice daily.  Subjective: Breathing and edema better this morning but still on 6 L by HFNC.  Denies chest pain or GI symptoms.  Soft blood pressures despite home midodrine.  Objective: Vitals:   12/18/19 0533 12/18/19 0744 12/18/19 0806 12/18/19 0808  BP: (!) 92/55 (!) 84/65    Pulse: 78 87    Resp: 17 (!) 26    Temp: 98.3 F (36.8 C)     TempSrc: Oral     SpO2:  90% 93% 93%  Weight: 79.5 kg     Height: 5' 7.5" (1.715 m)       Intake/Output Summary (Last 24 hours) at 12/18/2019 1012 Last data filed at 12/18/2019 0600 Gross per 24 hour  Intake 120 ml  Output --  Net 120 ml   Filed Weights   12/18/19 0500 12/18/19 0533  Weight: 79.5 kg 79.5 kg    Examination:  GENERAL: No acute distress.  Appears well.  HEENT: MMM.  Vision and hearing grossly intact.  NECK: Supple.  No apparent JVD.  RESP: 95% on 6 L by [place].  No IWOB.  Bibasilar crackles. CVS:  RRR.  2/6 SEM over RUSB and LUSB.  Heart  sounds normal.  ABD/GI/GU: Bowel sounds present. Soft. Non tender.  MSK/EXT: Both extremities and Ace wrap SKIN: no apparent skin lesion or wound NEURO: Awake, alert and oriented appropriately.  No apparent focal neuro deficit. PSYCH: Calm. Normal affect.  Procedures:  None  Assessment & Plan: Acute on chronic respiratory failure due to CHF exacerbation-requiring 6 L.  On 3 L at baseline. -Manage CHF as below -Wean oxygen as able -Encourage incentive spirometry  Acute on chronic biventricular heart failure Severe pulm hypertension  Moderate aortic stenosis -Echo on 11/23/2019 with LVEF >75%, G1 DD, severely reduced RVEF and severe pulm HTN.   -Presents with dyspnea, edema and weight gain.   -BNP > baseline.  CXR concerning for CHF.  Soft blood pressures with increased oxygen requirement.  -Creatinine slightly up trending.  -Cardiology consulted-difficult to diuresis given soft blood pressures and severely reduced RVEF. -Decrease IV Lasix to 40 mg once daily pending cardiology evaluation.  Already received morning dose today -Monitor fluid status, renal function and electrolytes closely -Continue Revatio and Opsumit for pulm HTN  Hypotension: Soft blood pressures this morning. -Continue home midodrine-not ideal given his heart failure. Could raise afterload -Decrease Lasix as above -Cardiology consulted  Paroxysmal A. Fib: Rate controlled without medication.  Not on anticoagulation due to GI bleed. -Continue monitoring  Chronic COPD: Doubt exacerbation.  Recently treated for bronchitis with antibiotic.  Procalcitonin negative. -Discontinue doxycycline -Continue breathing treatments -Wean oxygen as able  History of CAD/CABG: No chest pain.  Slightly elevated troponin likely demand ischemia from CHF.  -Continue home aspirin and statin  Anemia of chronic disease: B/l Hgb 9-10> 10.4 (admit)> 9.9 -Check anemia panel -Continue monitoring  AKI on CKD-3a/azotemia: B/l Cr ~1.1>  1.27 (on 2/11)> 1.33> 1.38 -Continue monitoring  Hypothyroidism: TSH within normal -Continue home Synthroid  AAA s/p endovascular repair -Continue started on aspirin  Hypokalemia: K2.7 this morning. -Replenish and recheck -Give 2 g of magnesium sulfate           DVT prophylaxis: Subcu Lovenox Code Status: Partial code.  No CPR or intubation but NIPPV/ACL meds/DCCV Family Communication: Patient and/or RN. Available if any question.  Discharge barrier: Acute respiratory failure with decompensated CHF Patient is from: Home Final disposition: To be determined  Consultants: Cardiology  Microbiology summarized: COVID-19 PCR negative  Sch Meds:  Scheduled Meds:  albuterol  2.5 mg Inhalation BID   aspirin  81 mg Oral Daily   enoxaparin (LOVENOX) injection  40 mg Subcutaneous Q24H   fluticasone  1 spray Each Nare Daily   furosemide  40 mg Intravenous BID   levothyroxine  112 mcg Oral QAC breakfast   loratadine  10 mg Oral Daily   macitentan  10 mg Oral Daily   midodrine  5 mg Oral TID WC   mometasone-formoterol  2 puff Inhalation BID   montelukast  10 mg Oral QHS   pantoprazole  40 mg Oral BID   potassium chloride  40 mEq Oral Q4H   sildenafil  40 mg Oral TID   simvastatin  10 mg Oral QHS   Continuous Infusions:  doxycycline (VIBRAMYCIN) IV 100 mg (12/18/19 0737)   PRN Meds:.acetaminophen **OR** acetaminophen, ondansetron **OR** ondansetron (ZOFRAN) IV  Antimicrobials: Anti-infectives (From admission, onward)   Start     Dose/Rate Route Frequency Ordered Stop   12/18/19 0630  doxycycline (VIBRAMYCIN) 100 mg in sodium chloride 0.9 % 250 mL IVPB     100 mg 125 mL/hr over 120 Minutes Intravenous 2 times daily 12/18/19 0628         I have personally reviewed the following labs and images: CBC: Recent Labs  Lab 12/17/19 2314 12/18/19 0531  WBC 12.2* 9.7  NEUTROABS 9.4* 6.5  HGB 10.4* 9.9*  HCT 34.8* 32.4*  MCV 79.3* 78.8*  PLT 409* 357    BMP &GFR Recent Labs  Lab 12/17/19 2314 12/18/19 0531  NA 134* 135  K 3.2* 2.7*  CL 98 94*  CO2 21* 28  GLUCOSE 128* 101*  BUN 27* 25*  CREATININE 1.33* 1.38*  CALCIUM 8.6* 8.7*  MG  --  1.8   Estimated Creatinine Clearance: 31.2 mL/min (A) (by C-G formula based on SCr of 1.38 mg/dL (H)). Liver & Pancreas: Recent Labs  Lab 12/18/19 0135  AST 19  ALT 11  ALKPHOS 67  BILITOT 0.6  PROT 7.0  ALBUMIN 2.6*   No results for input(s): LIPASE, AMYLASE in the last 168 hours. No results for input(s): AMMONIA in the last 168 hours. Diabetic: No results for input(s): HGBA1C in the last 72 hours. No results for input(s): GLUCAP in the last 168 hours. Cardiac Enzymes: No results for input(s): CKTOTAL, CKMB, CKMBINDEX, TROPONINI in the last 168 hours. No results  for input(s): PROBNP in the last 8760 hours. Coagulation Profile: No results for input(s): INR, PROTIME in the last 168 hours. Thyroid Function Tests: Recent Labs    12/18/19 0531  TSH 2.890   Lipid Profile: No results for input(s): CHOL, HDL, LDLCALC, TRIG, CHOLHDL, LDLDIRECT in the last 72 hours. Anemia Panel: No results for input(s): VITAMINB12, FOLATE, FERRITIN, TIBC, IRON, RETICCTPCT in the last 72 hours. Urine analysis: No results found for: COLORURINE, APPEARANCEUR, LABSPEC, PHURINE, GLUCOSEU, HGBUR, BILIRUBINUR, KETONESUR, PROTEINUR, UROBILINOGEN, NITRITE, LEUKOCYTESUR Sepsis Labs: Invalid input(s): PROCALCITONIN, [place]  Microbiology: Recent Results (from the past 240 hour(s))  SARS CORONAVIRUS 2 (TAT 6-24 HRS) Nasopharyngeal Nasopharyngeal Swab     Status: None   Collection Time: 12/18/19  2:37 AM   Specimen: Nasopharyngeal Swab  Result Value Ref Range Status   SARS Coronavirus 2 NEGATIVE NEGATIVE Final    Comment: (NOTE) SARS-CoV-2 target nucleic acids are NOT DETECTED. The SARS-CoV-2 RNA is generally detectable in upper and lower respiratory specimens during the acute phase of infection.  Negative results do not preclude SARS-CoV-2 infection, do not rule out co-infections with other pathogens, and should not be used as the sole basis for treatment or other patient management decisions. Negative results must be combined with clinical observations, patient history, and epidemiological information. The expected result is Negative. Fact Sheet for Patients: SugarRoll.be Fact Sheet for Healthcare Providers: https://www.woods-mathews.com/ This test is not yet approved or cleared by the [place] FDA and  has been authorized for detection and/or diagnosis of SARS-CoV-2 by FDA under an Emergency Use Authorization (EUA). This EUA will remain  in effect (meaning this test can be used) for the duration of the COVID-19 declaration under Section 56 4(b)(1) of the Act, 21 U.S.C. section 360bbb-3(b)(1), unless the authorization is terminated or revoked sooner. Performed at [facility], [place] [address] [place], [place] [address]     Radiology Studies: DG Chest Port 1 View  Result Date: 12/17/2019 CLINICAL DATA:  Shortness of breath EXAM: PORTABLE CHEST 1 VIEW COMPARISON:  11/22/2019, 10/18/2017 FINDINGS: Post sternotomy changes. Diffuse coarse interstitial opacity consistent with chronic disease. No acute consolidation, pleural effusion or pneumothorax. Cardiomegaly with mild central congestion. IMPRESSION: 1. Cardiomegaly with mild central congestion. 2. Diffuse interstitial opacity consistent with chronic disease. No acute airspace disease. Electronically Signed   By: Donavan Foil M.D.   On: 12/17/2019 23:40    35 minutes with more than 50% spent in reviewing records, counseling patient/family and coordinating care.   Yvone Slape T. [place]  If 7PM-7AM, please contact night-coverage [email or web address] Password [email or web address] 12/18/2019, 10:12 AM

## 2019-12-18 NOTE — Progress Notes (Signed)
Bilateral lower extremity venous duplex exam completed.  Preliminary results can be found under CV proc under chart review.  12/18/2019 1:36 PM  Ada Holness, K., RDMS, RVT

## 2019-12-18 NOTE — Progress Notes (Signed)
RT NOTES: ABG obtained and sent to lab. Lab tech [employer].

## 2019-12-18 NOTE — Consult Note (Addendum)
Cardiology Consultation:   Patient ID: Vincent Robinson MRN: [id number]; DOB: 06-07-1923  Admit date: 12/17/2019 Date of Consult: 12/18/2019  Primary Care Provider: Katherina Mires, MD Primary Cardiologist: Quay Burow, MD / Glori Bickers, MD Primary Electrophysiologist:  None    Patient Profile:   Vincent Robinson is a 86 y.o. male with a history of severe pulmonary hypertension with cor pulmonale and chronic diastolic CHF with EF of [id number] on recent Echo, CAD s/p remote CABG in 1998,  persistent atrial fibrillation (first noted in 2020) not on anticoagulation due to history of multiple diverticular bleeds, moderate aortic stenosis, AAA s/p endoluminal stent graft in 07/2008 (in [place]), interstitial lung disease/COPD/asthma with chronic respiratory failure, severe obstructive sleep apnea on BiPAP, spontaneous pneumothorax in 04/2017 treated conservatively, diverticular bleeds requiring transfusions, hyperlipidemia, and hypothyroidism who is being seen today for the evaluation of acute on chronic diastolic CHF at the request of Dr. Cyndia Skeeters.  History of Present Illness:   Vincent Robinson is a 86 year old male with the above history who is followed by Dr. Gwenlyn Found and Dr. Haroldine Laws. Patient first seen by Dr. Gwenlyn Found in 07/2017 for further evaluation of dyspnea on exertion not felt to be a pulmonary etiology. Echo at that time showed LVEF of 60-65% with grade 1 diastolic dysfunction and severe increased PASP of 85 mmHg. He was referred to the Advanced Heart Failure clinic. He was seen by Dr. Haroldine Laws and underwent right heart catheterization ion 11/2017 which was consistent with moderate pulmonary arterial hypertension with RV strain likely WHO Group 1. He was started on Sildenafil with a good response. At visit with Dr. Gwenlyn Found in 07/2019, he was noted to be in new onset atrial fibrillation. He was note started on anticoagulation due to history of significant diverticular bleeds requiring transfusions.   Patient was  recently admitted from 11/22/2019 to 11/25/2019 for acute on chronic respiratory failure. Echo during admission showed LVEF of >75% with normal wall motion, mild LVH, grade 1 diastolic dysfunction, moderate AS (mean gradient 24 mmHg), and moderate TR with severely elevated pulmonary pressures. RV was noted to be moderately enlarged with severely reduced systolic function. He was placed on Midodrine for soft BP prior to discharge. He was discharge on Lasix 40mg  daily and 3L of continuous O2. Discharge weight was 192 lbs. He was seen for follow-up in the [facility] on 12/03/2019 at which time he reported some dyspnea with exertion and lower extremity edema since discharge but denied any PND or orthopnea. Weight was stable. He was referred to [facility] for IV Lasix.  Patient presented to the ED yesterday via EMS for chest tightness and worsening shortness of breath.  Per EMS, SpO2 60% on 4L but improved to 94% on 6L.  Patient reports progressive dyspnea on exertion and lower extremity edema over the past several weeks.  Now gets extremely short of breath just walking to the door in the room.  He denies any shortness of breath at rest.  No orthopnea and PND.  He notes chest tightness activity when he gets extremely short of breath but denies any other chest pain.  He also reports some weight gain.  After last admission, he states his weight got down to high 160s but slow weight gain since that time.  Weight is 175 pounds today. However, weight at last office visit on 12/03/2019 was 188 lbs. no palpitations, lightheadedness, dizziness, presyncope/syncope.  No abnormal bleeding in urine or stools.  He notes a productive cough (coughing up green phlegm)  and nasal congestion in the morning for the past several weeks.  He was reportedly treated for bronchitis by his PCP recently and finish a course of antibiotics but these did not help.  No other fevers or recent illnesses.  No known exposure to the  coronavirus.  In the ED, BP soft with systolic BP in the [id number]. Patient tachypneic and mildly tachycardic at times. EKG showed atrial fibrillation, rate 114 bpm, with known RBBB, borderline right axis deviation, and ST depression in precordial leads. High-sensitivity troponin minimally elevated at 25 >> 40. BNP elevated at 670. Chest x-ray showed cardiomegaly with mild central congestion and diffuse interstitial opacity consistent with chronic disease. No acute airspace disease noted. WBC 12.2, Hgb 10.4, Plts 409. Na 134, K 3.2, CO2 21, Glucose 128, BUN 27, Scr 1.33. LFTs normal. Lactic acid normal x2. COVID-19 negative.  At the time of this evaluation, patient resting comfortably in bed. Now on 5L of supplemental O2. No shortness of breath at rest. Patient states lower extremity edema has improved since Lasix.  Heart Pathway Score:     Past Medical History:  Diagnosis Date  . AAA (abdominal aortic aneurysm) ([place])   . Acute respiratory failure ([place]) 11/23/2019  . Asthma   . Basal cell carcinoma   . Chronic respiratory failure ([place])   . COPD (chronic obstructive pulmonary disease) ([place])   . Coronary artery disease   . History of chronic respiratory failure   . Hyperlipidemia   . Hypothyroidism   . Right heart failure ([place])   . Skin cancer 2017  . Sleep apnea     Past Surgical History:  Procedure Laterality Date  . bleeding ulcer  2016  . BYPASS AXILLA/BRACHIAL ARTERY  1998  . colon bleed  2012  . COLONOSCOPY N/A 06/07/2017   Procedure: COLONOSCOPY;  Surgeon: Carol Ada, MD;  Location: [facility];  Service: Endoscopy;  Laterality: N/A;  . DEBRIDMENT OF DECUBITUS ULCER  2002  . RIGHT HEART CATH N/A 12/11/2017   Procedure: RIGHT HEART CATH;  Surgeon: Jolaine Artist, MD;  Location: [facility];  Service: Cardiovascular;  Laterality: N/A;  . THORACIC AORTA STENT  2009     Home Medications:  Prior to Admission medications   Medication Sig Start Date End Date Taking?  Authorizing Provider  albuterol (PROVENTIL HFA;VENTOLIN HFA) 108 (90 Base) MCG/ACT inhaler Inhale 1 puff into the lungs 2 (two) times daily. 05/20/17  Yes Mannam, Praveen, MD  aspirin (ASPIRIN 81) 81 MG chewable tablet Chew 1 tablet (81 mg total) by mouth daily. Restart in 1 week following hospitalization Patient taking differently: Chew 81 mg by mouth See admin instructions. Three times a week 07/14/19  Yes [place] Eric J, DO  fluticasone (VERAMYST) 27.5 MCG/SPRAY nasal spray Place 1 spray into the nose daily as needed for rhinitis.    Yes [provider]  Fluticasone-Salmeterol 113-14 MCG/ACT AEPB INHALE 1 PUFF TWICE A DAY Patient taking differently: Inhale 1 puff into the lungs in the morning and at bedtime.  03/12/18  Yes Mannam, Praveen, MD  furosemide (LASIX) 40 MG tablet Take 1 tablet (40 mg total) by mouth 2 (two) times daily. 12/16/19  Yes Lorretta Harp, MD  levofloxacin (LEVAQUIN) 500 MG tablet Take 500 mg by mouth daily. For 7 days   Yes [provider]  levothyroxine (SYNTHROID, LEVOTHROID) 112 MCG tablet Take 112 mcg by mouth daily before breakfast.   Yes [provider]  loratadine (CLARITIN) 10 MG tablet Take 10 mg  by mouth daily as needed for allergies.    Yes [provider]  macitentan (OPSUMIT) 10 MG tablet Take 1 tablet (10 mg total) by mouth daily. 09/14/19  Yes Jamaal Bernasconi, Shaune Pascal, MD  midodrine (PROAMATINE) 5 MG tablet Take 1 tablet (5 mg total) by mouth 3 (three) times daily with meals. 11/25/19 12/25/19 Yes Dahal, Marlowe Aschoff, MD  montelukast (SINGULAIR) 10 MG tablet TAKE ONE TABLET BY MOUTH AT BEDTIME Patient taking differently: Take 10 mg by mouth at bedtime.  03/02/19  Yes Mannam, Praveen, MD  pantoprazole (PROTONIX) 40 MG tablet Take 1 tablet (40 mg total) by mouth 2 (two) times daily. 11/25/19 12/25/19 Yes Dahal, Marlowe Aschoff, MD  sildenafil (REVATIO) 20 MG tablet Take 2 tablets (40 mg total) by mouth 3 (three) times daily. 10/21/19  Yes Lyda Jester M, PA-C  simvastatin (ZOCOR) 10 MG tablet Take 10 mg by mouth at bedtime.   Yes [provider]    Inpatient Medications: Scheduled Meds: . albuterol  2.5 mg Inhalation BID  . aspirin  81 mg Oral Daily  . enoxaparin (LOVENOX) injection  40 mg Subcutaneous Q24H  . fluticasone  1 spray Each Nare Daily  . [START ON 12/19/2019] furosemide  40 mg Intravenous Daily  . levothyroxine  112 mcg Oral QAC breakfast  . loratadine  10 mg Oral Daily  . macitentan  10 mg Oral Daily  . midodrine  5 mg Oral TID WC  . mometasone-formoterol  2 puff Inhalation BID  . montelukast  10 mg Oral QHS  . pantoprazole  40 mg Oral BID  . potassium chloride  40 mEq Oral Q4H  . sildenafil  40 mg Oral TID  . simvastatin  10 mg Oral QHS   Continuous Infusions:  PRN Meds: acetaminophen **OR** acetaminophen, ondansetron **OR** ondansetron (ZOFRAN) IV  Allergies:   No Known Allergies  Social History:   Social History   Socioeconomic History  . Marital status: Married    Spouse name: Not on file  . Number of children: Not on file  . Years of education: Not on file  . Highest education level: Not on file  Occupational History  . Not on file  Tobacco Use  . Smoking status: Former Smoker    Packs/day: 0.50    Quit date: 08/06/1961    Years since quitting: 58.4  . Smokeless tobacco: Never Used  Substance and Sexual Activity  . Alcohol use: Not Currently  . Drug use: Never  . Sexual activity: Not on file  Other Topics Concern  . Not on file  Social History Narrative  . Not on file   Social Determinants of Health   Financial Resource Strain:   . Difficulty of Paying Living Expenses: Not on file  Food Insecurity:   . Worried About Charity fundraiser in the Last Year: Not on file  . Ran Out of Food in the Last Year: Not on file  Transportation Needs:   . Lack of Transportation (Medical): Not on file  . Lack of Transportation (Non-Medical): Not on file  Physical Activity:   .  Days of Exercise per Week: Not on file  . Minutes of Exercise per Session: Not on file  Stress:   . Feeling of Stress : Not on file  Social Connections:   . Frequency of Communication with Friends and Family: Not on file  . Frequency of Social Gatherings with Friends and Family: Not on file  . Attends Religious Services: Not on file  .  Active Member of Clubs or Organizations: Not on file  . Attends Archivist Meetings: Not on file  . Marital Status: Not on file  Intimate Partner Violence:   . Fear of Current or Ex-Partner: Not on file  . Emotionally Abused: Not on file  . Physically Abused: Not on file  . Sexually Abused: Not on file    Family History:    Family History  Problem Relation Age of Onset  . Emphysema Mother   . Stroke Father   . Movement disorder Sister   . Aneurysm Brother        Thoracic aorta  . Macular degeneration Brother      ROS:  Please see the history of present illness.  All other ROS reviewed and negative.     Physical Exam/Data:   Vitals:   12/18/19 0744 12/18/19 0806 12/18/19 0808 12/18/19 1018  BP: (!) 84/65   (!) 88/53  Pulse: 87   67  Resp: (!) 26   19  Temp:    98.6 F (37 C)  TempSrc:    Oral  SpO2: 90% 93% 93% 98%  Weight:      Height:        Intake/Output Summary (Last 24 hours) at 12/18/2019 1145 Last data filed at 12/18/2019 1000 Gross per 24 hour  Intake 746.37 ml  Output 550 ml  Net 196.37 ml   Last 3 Weights 12/18/2019 12/18/2019 12/10/2019  Weight (lbs) 175 lb 4.3 oz 175 lb 4.3 oz 176 lb  Weight (kg) 79.5 kg 79.5 kg 79.833 kg     Body mass index is 27.05 kg/m.  General: 86 y.o. male resting comfortably in no acute distress. HEENT: Normocephalic and atraumatic. Sclera clear.  Neck: Supple. No carotid bruits. No JVD noted. Heart: Irregularly irregular rhythm with normal rate. Distinct S1 and S2. III/VI systolic murmur best heard at upper sternal border. No gallops or rubs. Radial and distal pedal pulses 2+ and  equal bilaterally. Lungs: No significant increased work of breathing. Currently on 6L via nasal cannula. Crackles noted in bilateral bases. No wheezes or rales. Abdomen: Soft, non-distended, and non-tender to palpation. Bowel sounds present. Extremities: 1=2+ pitting edema of bilaterally lower extremities (right > left which he states is unusual for him0 Skin: Warm and dry. Neuro: Alert and oriented x3. No focal deficits. Psych: Normal affect. Responds appropriately.   EKG:  The EKG was personally reviewed and demonstrates: Atrial fibrillation, rate 114 bpm, with known RBBB, borderline right axis deviation, and ST depression in precordial leads.  Telemetry:  Telemetry was personally reviewed and demonstrates: Atrial fibrillation and multiple PVCs. Rate in the 70's to 90's. P wave can occasional be seen but still think it is most consistent with atrial fibrillation.   Relevant CV Studies:  Lexiscan Myoview 10/23/2017:  The left ventricular ejection fraction is normal (55-65%).  Nuclear stress EF: 58%.  There was no ST segment deviation noted during stress.  The study is normal.  This is a low risk study.   Normal pharmacologic nuclear stress test with no evidence for prior infarct or ischemia. _______________  Right Heart Catheterization 12/11/2017: Findings: RA = 6 RV = 72/7 PA = 73/27 (44) PCW = 10 Fick cardiac output/index = 4.1/2.1 PVR = 8.4 WU Ao sat = 91% PA sat = 58%, 58%  Assessment: 1. Moderate PAH with RV strain likely WHO Group I  Plan/Discussion: Will start trial of sildenafil 25 tid.  _______________  Echocardiogram 11/23/2019: Impressions: 1. Left ventricular ejection  fraction, by visual estimation, is >75%. The  left ventricle has hyperdynamic function. There is mildly increased left  ventricular hypertrophy.  2. Left ventricular diastolic parameters are consistent with Grade I  diastolic dysfunction (impaired relaxation).  3. The left ventricle has  no regional wall motion abnormalities.  4. Global right ventricle has severely reduced systolic function.The  right ventricular size is moderately enlarged.  5. Left atrial size was mildly dilated.  6. Right atrial size was moderately dilated.  7. Mild mitral annular calcification.  8. The mitral valve is normal in structure. Trivial mitral valve  regurgitation. No evidence of mitral stenosis.  9. The tricuspid valve is normal in structure. Tricuspid valve  regurgitation is moderate.  10. The aortic valve is tricuspid. Aortic valve regurgitation is not  visualized. Moderate aortic valve stenosis.  11. The pulmonic valve was normal in structure. Pulmonic valve  regurgitation is not visualized.  12. Severely elevated pulmonary artery systolic pressure.  13. The inferior vena cava is dilated in size with >50% respiratory  variability, suggesting right atrial pressure of 8 mmHg.  14. Hyperdynamic LV systolic function, grade 1 diastolic dysfunction; mild  LVH; aortic valve calcified with moderate AS (mean gradient 24 mmHg); mild  LAE; moderate RAE/RVE; severe RV dysfunction; moderate TR with severely  elevated pulmonary pressure.   Laboratory Data:  High Sensitivity Troponin:   Recent Labs  Lab 11/22/19 1825 11/22/19 2340 11/23/19 1022 12/17/19 2314 12/18/19 0135  TROPONINIHS 30* 40* 35* 25* 40*     Chemistry Recent Labs  Lab 12/17/19 2314 12/18/19 0531  NA 134* 135  K 3.2* 2.7*  CL 98 94*  CO2 21* 28  GLUCOSE 128* 101*  BUN 27* 25*  CREATININE 1.33* 1.38*  CALCIUM 8.6* 8.7*  GFRNONAA 45* 43*  GFRAA 53* 50*  ANIONGAP 15 13    Recent Labs  Lab 12/18/19 0135  PROT 7.0  ALBUMIN 2.6*  AST 19  ALT 11  ALKPHOS 67  BILITOT 0.6   Hematology Recent Labs  Lab 12/17/19 2314 12/18/19 0531  WBC 12.2* 9.7  RBC 4.39 4.11*  HGB 10.4* 9.9*  HCT 34.8* 32.4*  MCV 79.3* 78.8*  MCH 23.7* 24.1*  MCHC 29.9* 30.6  RDW 22.3* 22.1*  PLT 409* 357   BNP Recent Labs   Lab 12/17/19 2314  BNP 670.0*    DDimer No results for input(s): DDIMER in the last 168 hours.   Radiology/Studies:  DG Chest Port 1 View  Result Date: 12/17/2019 CLINICAL DATA:  Shortness of breath EXAM: PORTABLE CHEST 1 VIEW COMPARISON:  11/22/2019, 10/18/2017 FINDINGS: Post sternotomy changes. Diffuse coarse interstitial opacity consistent with chronic disease. No acute consolidation, pleural effusion or pneumothorax. Cardiomegaly with mild central congestion. IMPRESSION: 1. Cardiomegaly with mild central congestion. 2. Diffuse interstitial opacity consistent with chronic disease. No acute airspace disease. Electronically Signed   By: Donavan Foil M.D.   On: 12/17/2019 23:40   Assessment and Plan:   Severe Pulmonary Hypertension with Cor Pulmonale/ Acute on Chronic Diastolic CHF - Patient admitted with acute on chronic respiratory failure. - BNP elevated in the 600's.  - Chest x-ray showed mild central congestion but no acute findings.  - Most recent Echo showed LVEF of >75% with normal wall motion, mild LVH, grade 1 diastolic dysfunction, moderate AS (mean gradient 24 mmHg), and moderate TR with severely elevated pulmonary pressures. RV was noted to be moderately enlarged with severely reduced systolic function. - Patient received 2 doses of IV Lasix 40mg . Documented urinary  output of 550 mL since admission (unsure if this is accurate). Weight 175 lbs today. Weight at recent discharge was 192 lbs but patient states he got as low as the high 160's prior to discharge. - Patient still has some evidence of volume overload with some bibasilar crackles and lower extremity edema. Will discuss additional Lasix dose with MD.  - BP is still soft with systolic BP in the 99991111 and 90's. Currently on midodrine 5mg  three times daily. Will discuss whether we need to increase Midodrine or start different inotrope with MD. Will also discuss whether PICC line is needed to help measure CO-OX. - Continue  Sildenafil 40mg  three times daily and Opsumit 10mg  daily for PAH.  - Continue to monitor daily weights, strict I/O's, and renal function.   CAD s/p Remote CABG in 1998 - EKG shows ST depressions in precordial leads. This may be due to tachycardia.  - High-sensitivity troponin 25 >> 40. - Patient notes mild chest tightness when he gets shortness of breath with activity. No other chest pain. Seems more consistent with acute CHF rather than true angina. - Do not anticipate any additional ischemic work-up. Continue aspirin and statin.  Persistent Atrial Fibrillation - Currently rate controlled without any medications. - Potassium 2.7 this morning. Goal >4.0. Repleted. - Magnesium 1.8 this morning. Goal >2.0. Repleted.  - Not on chronic anticoagulation due to history of severe diverticular bleeds requiring transfusions.   Hypotension - BP soft with systolic BP in the 99991111 to 90's. Patient asympomatic with this - good peripheral pulses and mentating well. - On Midodrine 5mg  three times daily at home.  - Will discuss with MD about increasing Midodrine vs starting new inotrope to allow for further diuresis.   Moderate Aortic Stenosis - Recent Echo showed mean gradient of 22.0 mmHg and peak gradient of 36.2 mmHg. Aortic valve area by VTI 1.28cm^2.  - Can continue to monitor as outpatient.   Thoracic Aortic Aneurysm - CTA in 10/2019 showed 4.5cm ascending thoracic aortic aneurysm. Semi-annual imaging recommended at that time.  AAA - History endoluminal stent graft in 07/2008 (in [place]). - Most recent ultrsound in 09/2017 showed patent aorta and iliac stent with abnormal dilatation of the mid and distal abdominal aorta (largest measurement 2.9 cm). - Due to repeat imaging. Can be done as outpatient.   Hyperlipidemia - Continue home Simvastatin.  Hypokalemia - Potassium 2.7 on admission. Being repleted.  - Magnesium 1.8. - Continue to monitor.   Interstitial Lung Disease/COPD/Asthma -  Initially started on Doxycycline for possible COPD exacerbation but this was felt to be less likely the etiology of acute respiratory failure and was discontinued.  - No active wheezing on exam.  - Continue breathing treatments.  - Management per prior team.  Microcytic Anemia - Hemoglobin 9.9 today. Recently baseline in 9-11 range. - Anemia panel checked. Ferritin and iron low. Absolute retic count normal. Folate and B12 normal. - Management per primary team.   AKI - Creatinine 1.38 today. Baseline 0.9 to 1.1. - Possibly pre-renal due to low BP. - Avoid Nephrotoxic agents. - Continue to monitor daily BMET.  For questions or updates, please contact [place] Please consult [email or web address] for contact info under     Signed, Darreld Mclean, PA-C  12/18/2019 11:45 AM   Patient seen and examined with the above-signed Advanced Practice Provider and/or Housestaff. I personally reviewed laboratory data, imaging studies and relevant notes. I independently examined the patient and formulated the important aspects of the plan. I  have edited the note to reflect any of my changes or salient points. I have personally discussed the plan with the patient and/or family.  86 y/o male with multiple medical issues including diastolic HF, PAH with end-stage cor pulmonale, moderate AS, permanent AF.  Recently admitted with HF. Did ok at home for a few weeks but over the last 2-3 days has gained nearly 10 pounds of fluid. Presented with severe dyspnea, hypoxia and LE edema.  Echo with LVEF 70% RV severely dilated and HK.  On sildenafil 40 tid and opsumit 10 daily at home.   Responding well to IV lasix.   General:  Elderly male. Weak appearing. No resp difficulty HEENT: normal Neck: supple. JVP to jaw. Carotids 2+ bilat; + bruits. No lymphadenopathy or thryomegaly appreciated. Cor: PMI nondisplaced. Irregular rate & rhythm. 2/6 AS s2 decreased. 2/6 TR. + RV lift Lungs: basilar  crackles Abdomen: soft, nontender, + distended. No hepatosplenomegaly. No bruits or masses. Good bowel sounds. Extremities: no cyanosis, clubbing, rash, 2+ edema Neuro: alert & orientedx3, cranial nerves grossly intact. moves all 4 extremities w/o difficulty. Affect pleasant  Options limited. Remains volume overloaded. Will increase lasix to 40 bid. Increase midodrine to 10 tid. Place TED hose. Supp K aggressively. Wil add spiro to help preserve K.   Glori Bickers, MD  3:56 PM

## 2019-12-18 NOTE — H&P (Addendum)
History and Physical    Vincent Robinson [id number] DOB: 11-12-1922 DOA: 12/17/2019  PCP: Katherina Mires, MD  Patient coming from: Home.  Chief Complaint: Shortness of breath.  HPI: Vincent Robinson is a 86 y.o. male with history of pulmonary hypertension, moderate aortic stenosis, CAD status post CABG, abdominal aortic aneurysm status post endovascular repair, hyperlipidemia presents to the ER because of worsening shortness of breath over the last 2 days.  Patient also noted increasing lower extremity edema.  Denies any chest pain.  Patient states he was treated for bronchitis 2 weeks ago and had a course of antibiotics.  Still has some greenish sputum.  Denies fever chills.  Denies chest pain.  Recently patient was having low normal blood pressure for which patient was placed on midodrine.  Patient shortness of breath is mostly exertional.  ED Course: In the ER patient has significant bilateral lower extremity edema with elevated JVD.  Chest x-ray shows congestion and chronic changes.  EKG shows A. fib initially heart rate was around 110 bpm.  Labs show WBC count 12.2 creatinine 1.32 weeks ago was 1.2.  BNP was 670 and high sensitive troponin was 25 and 40.  Lactic acid 1.1.  Hemoglobin 10.4.  Patient blood pressure was in the low normal in the 90s initially also was in the systolic [id number].  Lasix 40 mg IV given and admitted for acute CHF.  Review of Systems: As per HPI, rest all negative.   Past Medical History:  Diagnosis Date  . AAA (abdominal aortic aneurysm) ([place])   . Acute respiratory failure ([place]) 11/23/2019  . Asthma   . Basal cell carcinoma   . Chronic respiratory failure ([place])   . COPD (chronic obstructive pulmonary disease) ([place])   . Coronary artery disease   . History of chronic respiratory failure   . Hyperlipidemia   . Hypothyroidism   . Right heart failure ([place])   . Skin cancer 2017  . Sleep apnea     Past Surgical History:  Procedure Laterality Date  . bleeding ulcer   2016  . BYPASS AXILLA/BRACHIAL ARTERY  1998  . colon bleed  2012  . COLONOSCOPY N/A 06/07/2017   Procedure: COLONOSCOPY;  Surgeon: Carol Ada, MD;  Location: [facility];  Service: Endoscopy;  Laterality: N/A;  . DEBRIDMENT OF DECUBITUS ULCER  2002  . RIGHT HEART CATH N/A 12/11/2017   Procedure: RIGHT HEART CATH;  Surgeon: Jolaine Artist, MD;  Location: [facility];  Service: Cardiovascular;  Laterality: N/A;  . THORACIC AORTA STENT  2009     reports that he quit smoking about 58 years ago. He smoked 0.50 packs per day. He has never used smokeless tobacco. He reports previous alcohol use. He reports that he does not use drugs.  No Known Allergies  Family History  Problem Relation Age of Onset  . Emphysema Mother   . Stroke Father   . Movement disorder Sister   . Aneurysm Brother        Thoracic aorta  . Macular degeneration Brother     Prior to Admission medications   Medication Sig Start Date End Date Taking? Authorizing Provider  albuterol (PROVENTIL HFA;VENTOLIN HFA) 108 (90 Base) MCG/ACT inhaler Inhale 1 puff into the lungs 2 (two) times daily. 05/20/17   Mannam, Hart Robinsons, MD  aspirin (ASPIRIN 81) 81 MG chewable tablet Chew 1 tablet (81 mg total) by mouth daily. Restart in 1 week following hospitalization 07/14/19   [place] Donnamarie Poag, DO  fluticasone (  VERAMYST) 27.5 MCG/SPRAY nasal spray Place 1 spray into the nose daily.    [provider]  Fluticasone-Salmeterol 113-14 MCG/ACT AEPB INHALE 1 PUFF TWICE A DAY 03/12/18   Mannam, Praveen, MD  furosemide (LASIX) 40 MG tablet Take 1 tablet (40 mg total) by mouth 2 (two) times daily. 12/16/19   Lorretta Harp, MD  levothyroxine (SYNTHROID, LEVOTHROID) 112 MCG tablet Take 112 mcg by mouth daily before breakfast.    [provider]  loratadine (CLARITIN) 10 MG tablet Take 10 mg by mouth daily.    [provider]  macitentan (OPSUMIT) 10 MG tablet Take 1 tablet (10 mg total) by mouth daily. 09/14/19    Bensimhon, Shaune Pascal, MD  midodrine (PROAMATINE) 5 MG tablet Take 1 tablet (5 mg total) by mouth 3 (three) times daily with meals. 11/25/19 12/25/19  Dahal, Marlowe Aschoff, MD  montelukast (SINGULAIR) 10 MG tablet TAKE ONE TABLET BY MOUTH AT BEDTIME 03/02/19   Mannam, Praveen, MD  pantoprazole (PROTONIX) 40 MG tablet Take 1 tablet (40 mg total) by mouth 2 (two) times daily. 11/25/19 12/25/19  Terrilee Croak, MD  sildenafil (REVATIO) 20 MG tablet Take 2 tablets (40 mg total) by mouth 3 (three) times daily. 10/21/19   Lyda Jester M, PA-C  simvastatin (ZOCOR) 10 MG tablet Take 10 mg by mouth at bedtime. 03/22/17   [provider]    Physical Exam: Constitutional: Moderately built and nourished. Vitals:   12/18/19 0300 12/18/19 0315 12/18/19 0345 12/18/19 0400  BP: (!) 81/55 (!) 84/60 (!) 80/61 (!) 82/59  Pulse: 87 88 88 86  Resp: 19 (!) 21 (!) 26 (!) 23  Temp:      SpO2: 92% 91% 95% 93%   Eyes: Anicteric no pallor. ENMT: No discharge from the ears eyes nose or mouth. Neck: JVD elevated no mass felt. Respiratory: No rhonchi or crepitations. Cardiovascular: S1-S2 heard. Abdomen: Soft nontender bowel sound present. Musculoskeletal: Bilateral lower extremity edema present. Skin: No rash. Neurologic: Alert awake oriented to time place and person.  Moves all extremities. Psychiatric: Appears normal per normal affect.   Labs on Admission: I have personally reviewed following labs and imaging studies  CBC: Recent Labs  Lab 12/17/19 2314  WBC 12.2*  NEUTROABS 9.4*  HGB 10.4*  HCT 34.8*  MCV 79.3*  PLT [id number]*   Basic Metabolic Panel: Recent Labs  Lab 12/17/19 2314  NA 134*  K 3.2*  CL 98  CO2 21*  GLUCOSE 128*  BUN 27*  CREATININE 1.33*  CALCIUM 8.6*   GFR: Estimated Creatinine Clearance: 32.3 mL/min (A) (by C-G formula based on SCr of 1.33 mg/dL (H)). Liver Function Tests: Recent Labs  Lab 12/18/19 0135  AST 19  ALT 11  ALKPHOS 67  BILITOT 0.6  PROT 7.0  ALBUMIN 2.6*     No results for input(s): LIPASE, AMYLASE in the last 168 hours. No results for input(s): AMMONIA in the last 168 hours. Coagulation Profile: No results for input(s): INR, PROTIME in the last 168 hours. Cardiac Enzymes: No results for input(s): CKTOTAL, CKMB, CKMBINDEX, TROPONINI in the last 168 hours. BNP (last 3 results) No results for input(s): PROBNP in the last 8760 hours. HbA1C: No results for input(s): HGBA1C in the last 72 hours. CBG: No results for input(s): GLUCAP in the last 168 hours. Lipid Profile: No results for input(s): CHOL, HDL, LDLCALC, TRIG, CHOLHDL, LDLDIRECT in the last 72 hours. Thyroid Function Tests: No results for input(s): TSH, T4TOTAL, FREET4, T3FREE, THYROIDAB in the last 72  hours. Anemia Panel: No results for input(s): VITAMINB12, FOLATE, FERRITIN, TIBC, IRON, RETICCTPCT in the last 72 hours. Urine analysis: No results found for: COLORURINE, APPEARANCEUR, LABSPEC, PHURINE, GLUCOSEU, HGBUR, BILIRUBINUR, KETONESUR, PROTEINUR, UROBILINOGEN, NITRITE, LEUKOCYTESUR Sepsis Labs: @LABRCNTIP (procalcitonin:4,lacticidven:4) )No results found for this or any previous visit (from the past 240 hour(s)).   Radiological Exams on Admission: DG Chest Port 1 View  Result Date: 12/17/2019 CLINICAL DATA:  Shortness of breath EXAM: PORTABLE CHEST 1 VIEW COMPARISON:  11/22/2019, 10/18/2017 FINDINGS: Post sternotomy changes. Diffuse coarse interstitial opacity consistent with chronic disease. No acute consolidation, pleural effusion or pneumothorax. Cardiomegaly with mild central congestion. IMPRESSION: 1. Cardiomegaly with mild central congestion. 2. Diffuse interstitial opacity consistent with chronic disease. No acute airspace disease. Electronically Signed   By: Donavan Foil M.D.   On: 12/17/2019 23:40    EKG: Independently reviewed.  A. fib rate was 100 bpm.  Assessment/Plan Principal Problem:   Acute respiratory failure with hypoxia (HCC) Active Problems:    Pulmonary hypertension, unspecified (HCC)   Aortic stenosis, moderate   Atrial fibrillation (HCC)   S/P CABG (coronary artery bypass graft)   Acute on chronic right-sided heart failure ([place])    1. Acute CHF with history of pulmonary hypertension and moderate aortic stenosis last 2D echo done in November 23, 2019 showed grade 1 diastolic dysfunction with EF of more than 75% with severely reduced RV systolic function.  I have placed patient on Lasix 40 with IV every 12 will get cardiology assistance since patient blood pressures in the low normal.  Follow intake output and daily weights and metabolic panel.  Patient states he has gained about 6 pounds in the last 1 week.  Patient is also on sildenafil and OPSUMIT.  Will check Dopplers of the lower extremity. 2. Recently treated for bronchitis still has productive cough.  Will check procalcitonin.  Until then we will keep patient on doxycycline. 3. A. fib rate controlled presently.  Not on anticoagulation secondary to severe GI bleed. 4. CAD status post CABG on aspirin and statin. 5. Chronic anemia follow CBC. 6. Acute renal failure recently worsened.  Follow metabolic panel closely.  Follow UA. 7. Hypothyroidism on Synthroid.  Given that patient has significant fluid overload with the multiple comorbidities will need inpatient status.  DVT prophylaxis: Lovenox. Code Status: DO NOT INTUBATE no CPR BiPAP is okay. Family Communication: Discussed with patient. Disposition Plan: Home. Consults called: Cardiology. Admission status: Inpatient.   Rise Patience MD [facility] Pager [phone].  If 7PM-7AM, please contact night-coverage [email or web address] Password [facility]  12/18/2019, 4:55 AM

## 2019-12-18 NOTE — ED Notes (Signed)
Pt desat to 85% spo2 when rising from bed.

## 2019-12-19 DIAGNOSIS — D500 Iron deficiency anemia secondary to blood loss (chronic): Secondary | ICD-10-CM

## 2019-12-19 DIAGNOSIS — I272 Pulmonary hypertension, unspecified: Secondary | ICD-10-CM

## 2019-12-19 DIAGNOSIS — I4811 Longstanding persistent atrial fibrillation: Secondary | ICD-10-CM

## 2019-12-19 DIAGNOSIS — I5033 Acute on chronic diastolic (congestive) heart failure: Secondary | ICD-10-CM

## 2019-12-19 LAB — CBC
HCT: 29.5 % — ABNORMAL LOW (ref 39.0–52.0)
Hemoglobin: 9.2 g/dL — ABNORMAL LOW (ref 13.0–17.0)
MCH: 24.3 pg — ABNORMAL LOW (ref 26.0–34.0)
MCHC: 31.2 g/dL (ref 30.0–36.0)
MCV: 78 fL — ABNORMAL LOW (ref 80.0–100.0)
Platelets: 343 10*3/uL (ref 150–400)
RBC: 3.78 MIL/uL — ABNORMAL LOW (ref 4.22–5.81)
RDW: 21.6 % — ABNORMAL HIGH (ref 11.5–15.5)
WBC: 10 10*3/uL (ref 4.0–10.5)
nRBC: 0 % (ref 0.0–0.2)

## 2019-12-19 LAB — RENAL FUNCTION PANEL
Albumin: 2.6 g/dL — ABNORMAL LOW (ref 3.5–5.0)
Anion gap: 10 (ref 5–15)
BUN: 27 mg/dL — ABNORMAL HIGH (ref 8–23)
CO2: 30 mmol/L (ref 22–32)
Calcium: 8.4 mg/dL — ABNORMAL LOW (ref 8.9–10.3)
Chloride: 97 mmol/L — ABNORMAL LOW (ref 98–111)
Creatinine, Ser: 1.51 mg/dL — ABNORMAL HIGH (ref 0.61–1.24)
GFR calc Af Amer: 45 mL/min — ABNORMAL LOW (ref >60)
GFR calc non Af Amer: 39 mL/min — ABNORMAL LOW (ref >60)
Glucose, Bld: 105 mg/dL — ABNORMAL HIGH (ref 70–99)
Phosphorus: 4.3 mg/dL (ref 2.5–4.6)
Potassium: 3.3 mmol/L — ABNORMAL LOW (ref 3.5–5.1)
Sodium: 137 mmol/L (ref 135–145)

## 2019-12-19 LAB — MAGNESIUM: Magnesium: 1.9 mg/dL (ref 1.7–2.4)

## 2019-12-19 MED ORDER — POTASSIUM CHLORIDE CRYS ER 20 MEQ PO TBCR
40.0000 meq | EXTENDED_RELEASE_TABLET | Freq: Once | ORAL | Status: AC
  Administered 2019-12-19: 40 meq via ORAL
  Filled 2019-12-19: qty 2

## 2019-12-19 MED ORDER — IPRATROPIUM-ALBUTEROL 0.5-2.5 (3) MG/3ML IN SOLN
3.0000 mL | Freq: Three times a day (TID) | RESPIRATORY_TRACT | Status: DC
  Administered 2019-12-19 – 2019-12-23 (×14): 3 mL via RESPIRATORY_TRACT
  Filled 2019-12-19 (×13): qty 3

## 2019-12-19 MED ORDER — FERROUS SULFATE 325 (65 FE) MG PO TABS
325.0000 mg | ORAL_TABLET | Freq: Two times a day (BID) | ORAL | Status: DC
  Administered 2019-12-19 – 2019-12-23 (×9): 325 mg via ORAL
  Filled 2019-12-19 (×9): qty 1

## 2019-12-19 NOTE — Evaluation (Signed)
Occupational Therapy Evaluation Patient Details Name: Vincent Robinson MRN: [id number] DOB: [birth year] Today's Date: 12/19/2019    History of Present Illness 86 y.o. male with history of pulmonary hypertension, moderate aortic stenosis, CAD status post CABG, abdominal aortic aneurysm status post repair, interstitial lung disease/COPD/asthma with chronic respiratory failure, severe obstructive sleep apnea on BiPAP who presents to the ER due to worsening shortness of breath over the last 2 days.  Patient also noted increasing lower extremity edema.  Denies any chest pain. Of note pt with recent admit 1/31-2/3 for acute on chronic respiratory failure.    Clinical Impression   This 86 y/o male presents with the above. PTA pt reports mod independence with ADL and mobility, reports using RW since most recent discharge home from hospital earlier this month. Pt overall with limitations due to decreased activity tolerance, generalized weakness. Pt able to perform room level mobility using RW at minguard assist level, he currently requires minA for LB ADL, setup assist for seated UB ADL. Pt on 8L HFNC this session with SpO2 >/=87%, HR fluctuating 100s-130 bpm with activity. Pt will benefit from continued acute OT services and currently recommend follow up [facility] services after discharge to maximize his overall safety and independence with ADL and mobility. Will follow.     Follow Up Recommendations  Home health OT;Supervision/Assistance - 24 hour    Equipment Recommendations  Tub/shower seat           Precautions / Restrictions Precautions Precautions: Fall Precaution Comments: monitor vitals Restrictions Weight Bearing Restrictions: No      Mobility Bed Mobility Overal bed mobility: Needs Assistance Bed Mobility: Supine to Sit     Supine to sit: Min guard     General bed mobility comments: for lines and safety  Transfers Overall transfer level: Needs assistance Equipment used: Rolling  walker (2 wheeled) Transfers: Sit to/from Stand Sit to Stand: Min guard         General transfer comment: close minguard for safety and immediate standing balance, cues for safe UE placement, sit<>stand x2 from EOB    Balance Overall balance assessment: Needs assistance Sitting-balance support: Feet supported Sitting balance-Leahy Scale: Good     Standing balance support: Bilateral upper extremity supported;No upper extremity supported Standing balance-Leahy Scale: Fair Standing balance comment: able to maintain static balance without UE support                           ADL either performed or assessed with clinical judgement   ADL Overall ADL's : Needs assistance/impaired Eating/Feeding: Modified independent;Sitting Eating/Feeding Details (indicate cue type and reason): finishing breakfast upon arrival Grooming: Wash/dry face;Set up;Sitting   Upper Body Bathing: Set up;Sitting   Lower Body Bathing: Min guard;Sit to/from stand   Upper Body Dressing : Set up;Sitting   Lower Body Dressing: Sit to/from stand;Minimal assistance Lower Body Dressing Details (indicate cue type and reason): required assist to don socks Toilet Transfer: Min guard;Ambulation;RW Toilet Transfer Details (indicate cue type and reason): simulated via transfer to recliner Toileting- Clothing Manipulation and Hygiene: Min guard;Sit to/from stand       Functional mobility during ADLs: Min guard;Rolling walker                           Pertinent Vitals/Pain Pain Assessment: No/denies pain     Hand Dominance Right   Extremity/Trunk Assessment Upper Extremity Assessment Upper Extremity Assessment: Generalized weakness   Lower  Extremity Assessment Lower Extremity Assessment: Defer to PT evaluation       Communication Communication Communication: Other (comment)(mild HOH)   Cognition Arousal/Alertness: Awake/alert Behavior During Therapy: WFL for tasks  assessed/performed Overall Cognitive Status: Within Functional Limits for tasks assessed                                 General Comments: for basic tasks assessed today   General Comments  HR fluctuating 102-130 with activity, SpO2 >/=87% on 8L HFNC, BP soft but stable, initially 101/52 (68) in supine, 92/58 (69) seated EOB, 96/59 (73) post standing activity, pt denies dizziness    Exercises     Shoulder Instructions      Home Living Family/patient expects to be discharged to:: Private residence Living Arrangements: Spouse/significant other Available Help at Discharge: Family;Available 24 hours/day Type of Home: House Home Access: Stairs to enter [employer] of Steps: 1   Home Layout: One level     Bathroom Shower/Tub: Tub/shower unit;Walk-in shower   Bathroom Toilet: Standard     Home Equipment: Environmental consultant - 2 wheels;Cane - single point          Prior Functioning/Environment Level of Independence: Independent with assistive device(s)        Comments: using RW since most recent discharge home        OT Problem List: Decreased strength;Decreased activity tolerance;Impaired balance (sitting and/or standing);Decreased knowledge of use of DME or AE;Cardiopulmonary status limiting activity      OT Treatment/Interventions: Self-care/ADL training;Therapeutic exercise;Neuromuscular education;DME and/or AE instruction;Energy conservation;Therapeutic activities;Patient/family education;Balance training    OT Goals(Current goals can be found in the care plan section) Acute Rehab OT Goals Patient Stated Goal: maintain his strength and independence OT Goal Formulation: With patient Time For Goal Achievement: 01/02/20 Potential to Achieve Goals: Good  OT Frequency: Min 2X/week   Barriers to D/C:            Co-evaluation              AM-PAC OT "6 Clicks" Daily Activity     Outcome Measure Help from another person eating meals?: None Help  from another person taking care of personal grooming?: A Little Help from another person toileting, which includes using toliet, bedpan, or urinal?: A Little Help from another person bathing (including washing, rinsing, drying)?: A Little Help from another person to put on and taking off regular upper body clothing?: None Help from another person to put on and taking off regular lower body clothing?: A Little 6 Click Score: 20   End of Session Equipment Utilized During Treatment: Rolling walker Nurse Communication: Mobility status  Activity Tolerance: Patient tolerated treatment well Patient left: in chair;with call bell/phone within reach;with nursing/sitter in room  OT Visit Diagnosis: Muscle weakness (generalized) (M62.81);Unsteadiness on feet (R26.81)                Time: [id number] OT Time Calculation (min): 41 min Charges:  OT General Charges $OT Visit: 1 Visit OT Evaluation $OT Eval Moderate Complexity: 1 Mod OT Treatments $Self Care/Home Management : 23-37 mins  Lou Cal, OT Supplemental Rehabilitation Services Pager [phone] Office [phone]   Raymondo Band 12/19/2019, 10:39 AM

## 2019-12-19 NOTE — Progress Notes (Signed)
PROGRESS NOTE  Vincent Robinson [id number] DOB: [birth year]   PCP: Katherina Mires, MD  Patient is from: Home.  Lives with his wife.  Uses walker at baseline  DOA: 12/17/2019 LOS: 1  Brief Narrative / Interim history: 86 year old male with history of CAD/CABG, diastolic CHF, pulm HTN, COPD/chronic RF on 3 L, moderate AS, AAA s/p endovascular repair, A. fib not on anticoagulation due to GIB and hypertension presenting with increased shortness of breath and edema for 2 days, and 6 pound weight gain in about a week.  In ED, soft blood pressures.  Slightly tachycardic.  Desaturated to 88% requiring 6 L. Na 134.  K3.2.  Cr 1.3 (1.27 on 2/11).  BUN 27.  BNP 670 (higher than b/l).  HS Trop 25> 40.  Lactic acid negative.  WBC 12.2.  HTN 0.4.  CXR concerning for CHF.  COVID-19 antigen and PCR negative.  EKG with TW changes in inferior and anterior leads and RBBB.  Patient was admitted for acute on chronic respiratory failure with hypoxia due to diastolic CHF exacerbation.  Started on IV Lasix 40 mg twice daily.  Subjective: No major events overnight or this morning.  No complaints.  He denies shortness of breath, chest pain, GI or UTI symptoms.  He denies melena or hematochezia.  He is on 7 L by HFNC this morning.   Objective: Vitals:   12/19/19 0637 12/19/19 0638 12/19/19 0740 12/19/19 0747  BP:   92/62   Pulse: (!) 54 75    Resp: 17 (!) 21 20   Temp:   (!) 97.1 F (36.2 C)   TempSrc:   Oral   SpO2: 95% 96% 93% 92%  Weight:      Height:        Intake/Output Summary (Last 24 hours) at 12/19/2019 1059 Last data filed at 12/18/2019 2359 Gross per 24 hour  Intake 840 ml  Output 1850 ml  Net -1010 ml   Filed Weights   12/18/19 0500 12/18/19 0533  Weight: 79.5 kg 79.5 kg    Examination:  GENERAL: No acute distress.  Appears well.  HEENT: MMM.  Vision and hearing grossly intact.  NECK: Supple.  No apparent JVD.  RESP: 94% on 7 L.  No IWOB.  Bibasilar crackles. CVS:  RRR. Heart sounds  normal.  ABD/GI/GU: Bowel sounds present. Soft. Non tender.  MSK/EXT:  Moves extremities.  TED hose over both extremities. SKIN: no apparent skin lesion or wound NEURO: Awake, alert and oriented appropriately.  No apparent focal neuro deficit. PSYCH: Calm. Normal affect.  Procedures:  None  Assessment & Plan: Acute on chronic respiratory failure due to CHF exacerbation-requiring 7 L.  On 3 L at baseline.  ABG reassuring. -Wean oxygen as able -Encourage incentive spirometry -Manage CHF as below.  Acute on chronic biventricular heart failure Severe pulm hypertension  Moderate aortic stenosis -Echo on 11/23/2019 with LVEF >75%, G1 DD, severely reduced RVEF and severe pulm HTN.   -Presents with dyspnea, edema and weight gain.   -BNP > baseline.  CXR concerning for CHF.  Soft BP with increased oxygen requirement.  -Creatinine slightly up trending.  -Diuretics per cardiology-on IV Lasix 40 mg twice daily and Aldactone 12.5 mg daily. -Monitor fluid status, renal function and electrolytes closely -Continue Revatio and Opsumit for pulm HTN  Hypotension: Still with soft blood pressures. -Continue home midodrine-not ideal given his heart failure. Could raise afterload -Appreciate cardiology guidance  Paroxysmal A. Fib: Rate controlled without medication.  Not on anticoagulation due  to GI bleed. -Continue monitoring  Chronic COPD: Doubt exacerbation.  Recently treated for bronchitis with antibiotic.  Procalcitonin negative. ABG reassuring. -Discontinued doxycycline -Continue breathing treatments -Wean oxygen as able  History of CAD/CABG: No chest pain.  Slightly elevated troponin likely demand ischemia from CHF.  -Continue home aspirin and statin  Iron deficiency anemia: B/l Hgb 9-10> 10.4 (admit)> 9.9> 9.2.  Iron sat 7%.  Ferritin 15.  History of GIB but denies melena or hematochezia. -IV Feraheme on 2/26.  Additional dose in 3 to 4 days -P.o. iron -Check FOBT -Continue  monitoring  AKI on CKD-3a/azotemia: B/l Cr ~1.1> 1.27 (on 2/11)> 1.33> 1.38> 1.51 -Continue monitoring  Hypothyroidism: TSH within normal -Continue home Synthroid  AAA s/p endovascular repair -Continue started on aspirin  Hypokalemia: K 3.3 this morning. -Replenish and recheck           DVT prophylaxis: Subcu Lovenox Code Status: Partial code.  No CPR or intubation but NIPPV/ACL meds/DCCV Family Communication: Patient and/or RN. Available if any question.  Discharge barrier: Acute respiratory failure with decompensated CHF Patient is from: Home Final disposition: To be determined  Consultants: Cardiology  Microbiology summarized: COVID-19 PCR negative  Sch Meds:  Scheduled Meds: . aspirin  81 mg Oral Daily  . fluticasone  1 spray Each Nare Daily  . furosemide  40 mg Intravenous BID  . ipratropium-albuterol  3 mL Nebulization TID  . levothyroxine  112 mcg Oral QAC breakfast  . loratadine  10 mg Oral Daily  . macitentan  10 mg Oral Daily  . midodrine  10 mg Oral TID WC  . mometasone-formoterol  2 puff Inhalation BID  . montelukast  10 mg Oral QHS  . pantoprazole  40 mg Oral BID  . potassium chloride  20 mEq Oral BID  . sildenafil  40 mg Oral TID  . simvastatin  10 mg Oral QHS  . spironolactone  12.5 mg Oral Daily   Continuous Infusions:  PRN Meds:.acetaminophen **OR** acetaminophen, albuterol, ondansetron **OR** ondansetron (ZOFRAN) IV  Antimicrobials: Anti-infectives (From admission, onward)   Start     Dose/Rate Route Frequency Ordered Stop   12/18/19 0630  doxycycline (VIBRAMYCIN) 100 mg in sodium chloride 0.9 % 250 mL IVPB  Status:  Discontinued     100 mg 125 mL/hr over 120 Minutes Intravenous 2 times daily 12/18/19 0628 12/18/19 1042       I have personally reviewed the following labs and images: CBC: Recent Labs  Lab 12/17/19 2314 12/18/19 0531 12/19/19 0213  WBC 12.2* 9.7 10.0  NEUTROABS 9.4* 6.5  --   HGB 10.4* 9.9* 9.2*  HCT 34.8*  32.4* 29.5*  MCV 79.3* 78.8* 78.0*  PLT 409* 357 343   BMP &GFR Recent Labs  Lab 12/17/19 2314 12/18/19 0531 12/18/19 1737 12/19/19 0213  NA 134* 135 129* 137  K 3.2* 2.7* 3.2* 3.3*  CL 98 94* 93* 97*  CO2 21* 28 26 30   GLUCOSE 128* 101* 122* 105*  BUN 27* 25* 27* 27*  CREATININE 1.33* 1.38* 1.40* 1.51*  CALCIUM 8.6* 8.7* 8.2* 8.4*  MG  --  1.8  --  1.9  PHOS  --   --   --  4.3   Estimated Creatinine Clearance: 28.5 mL/min (A) (by C-G formula based on SCr of 1.51 mg/dL (H)). Liver & Pancreas: Recent Labs  Lab 12/18/19 0135 12/19/19 0213  AST 19  --   ALT 11  --   ALKPHOS 67  --   BILITOT 0.6  --  PROT 7.0  --   ALBUMIN 2.6* 2.6*   No results for input(s): LIPASE, AMYLASE in the last 168 hours. No results for input(s): AMMONIA in the last 168 hours. Diabetic: No results for input(s): HGBA1C in the last 72 hours. No results for input(s): GLUCAP in the last 168 hours. Cardiac Enzymes: No results for input(s): CKTOTAL, CKMB, CKMBINDEX, TROPONINI in the last 168 hours. No results for input(s): PROBNP in the last 8760 hours. Coagulation Profile: No results for input(s): INR, PROTIME in the last 168 hours. Thyroid Function Tests: Recent Labs    12/18/19 0531  TSH 2.890   Lipid Profile: No results for input(s): CHOL, HDL, LDLCALC, TRIG, CHOLHDL, LDLDIRECT in the last 72 hours. Anemia Panel: Recent Labs    12/18/19 1103  VITAMINB12 266  FOLATE 9.7  FERRITIN 15*  TIBC 353  IRON 23*  RETICCTPCT 1.9   Urine analysis: No results found for: COLORURINE, APPEARANCEUR, LABSPEC, PHURINE, GLUCOSEU, HGBUR, BILIRUBINUR, KETONESUR, PROTEINUR, UROBILINOGEN, NITRITE, LEUKOCYTESUR Sepsis Labs: Invalid input(s): PROCALCITONIN, [place]  Microbiology: Recent Results (from the past 240 hour(s))  SARS CORONAVIRUS 2 (TAT 6-24 HRS) Nasopharyngeal Nasopharyngeal Swab     Status: None   Collection Time: 12/18/19  2:37 AM   Specimen: Nasopharyngeal Swab  Result Value Ref  Range Status   SARS Coronavirus 2 NEGATIVE NEGATIVE Final    Comment: (NOTE) SARS-CoV-2 target nucleic acids are NOT DETECTED. The SARS-CoV-2 RNA is generally detectable in upper and lower respiratory specimens during the acute phase of infection. Negative results do not preclude SARS-CoV-2 infection, do not rule out co-infections with other pathogens, and should not be used as the sole basis for treatment or other patient management decisions. Negative results must be combined with clinical observations, patient history, and epidemiological information. The expected result is Negative. Fact Sheet for Patients: SugarRoll.be Fact Sheet for Healthcare Providers: https://www.woods-mathews.com/ This test is not yet approved or cleared by the [place] FDA and  has been authorized for detection and/or diagnosis of SARS-CoV-2 by FDA under an Emergency Use Authorization (EUA). This EUA will remain  in effect (meaning this test can be used) for the duration of the COVID-19 declaration under Section 56 4(b)(1) of the Act, 21 U.S.C. section 360bbb-3(b)(1), unless the authorization is terminated or revoked sooner. Performed at [facility], [place] [address] [place], [place] [address]     Radiology Studies: DG Chest Port 1 View  Result Date: 12/18/2019 CLINICAL DATA:  Short of breath, previous tobacco abuse EXAM: PORTABLE CHEST 1 VIEW COMPARISON:  12/17/2019 at 11:21 p.m. FINDINGS: Single frontal view of the chest demonstrates a stable cardiac silhouette. Postsurgical changes from prior CABG. Stable chronic diffuse interstitial scarring and fibrosis. No superimposed airspace disease, effusion, or pneumothorax. IMPRESSION: 1. Chronic interstitial lung disease.  No acute process. Electronically Signed   By: Randa Ngo M.D.   On: 12/18/2019 19:13   VAS [place] LOWER EXTREMITY VENOUS (DVT)  Result Date: 12/18/2019  Lower Venous DVTStudy Indications:  Edema, and SOB.  Risk Factors: CAD, CHF, HTN, AAA. Anticoagulation: Lovenox. Comparison Study: No prior exam. Performing Technologist: Baldwin Crown ARDMS, RVT  Examination Guidelines: A complete evaluation includes B-mode imaging, spectral Doppler, color Doppler, and power Doppler as needed of all accessible portions of each vessel. Bilateral testing is considered an integral part of a complete examination. Limited examinations for reoccurring indications may be performed as noted. The reflux portion of the exam is performed with the patient in reverse Trendelenburg.  +---------+---------------+---------+-----------+----------+--------------+ RIGHT  CompressibilityPhasicitySpontaneityPropertiesThrombus Aging +---------+---------------+---------+-----------+----------+--------------+ CFV      Full           Yes      Yes                                 +---------+---------------+---------+-----------+----------+--------------+ SFJ      Full                                                        +---------+---------------+---------+-----------+----------+--------------+ FV Prox  Full                                                        +---------+---------------+---------+-----------+----------+--------------+ FV Mid   Full                                                        +---------+---------------+---------+-----------+----------+--------------+ FV DistalFull                                                        +---------+---------------+---------+-----------+----------+--------------+ PFV      Full                                                        +---------+---------------+---------+-----------+----------+--------------+ POP      Full           Yes      Yes                                 +---------+---------------+---------+-----------+----------+--------------+ PTV      Full                                                         +---------+---------------+---------+-----------+----------+--------------+ PERO     Full                                                        +---------+---------------+---------+-----------+----------+--------------+   +---------+---------------+---------+-----------+----------+--------------+ LEFT     CompressibilityPhasicitySpontaneityPropertiesThrombus Aging +---------+---------------+---------+-----------+----------+--------------+ CFV      Full           Yes      Yes                                 +---------+---------------+---------+-----------+----------+--------------+  SFJ      Full                                                        +---------+---------------+---------+-----------+----------+--------------+ FV Prox  Full                                                        +---------+---------------+---------+-----------+----------+--------------+ FV Mid   Full                                                        +---------+---------------+---------+-----------+----------+--------------+ FV DistalFull                                                        +---------+---------------+---------+-----------+----------+--------------+ PFV      Full                                                        +---------+---------------+---------+-----------+----------+--------------+ POP      Full           Yes      Yes                                 +---------+---------------+---------+-----------+----------+--------------+ PTV      Full                                                        +---------+---------------+---------+-----------+----------+--------------+ PERO     Full                                                        +---------+---------------+---------+-----------+----------+--------------+     Summary: BILATERAL: - No evidence of deep vein thrombosis seen in the lower extremities, bilaterally.  RIGHT: - No  cystic structure found in the popliteal fossa.  LEFT: - No cystic structure found in the popliteal fossa.  *See table(s) above for measurements and observations. Electronically signed by Servando Snare MD on 12/18/2019 at 3:23:03 PM.    Final      Lakshya Mcgillicuddy T. Bee  If 7PM-7AM, please contact night-coverage [email or web address] Password [facility] 12/19/2019, 10:59 AM

## 2019-12-19 NOTE — Progress Notes (Addendum)
Progress Note  Patient Name: Vincent Robinson Date of Encounter: 12/19/2019  Primary Cardiologist: Quay Burow, MD   Subjective   Dyspnea improved. Edema improved  Inpatient Medications    Scheduled Meds:  aspirin  81 mg Oral Daily   fluticasone  1 spray Each Nare Daily   furosemide  40 mg Intravenous BID   ipratropium-albuterol  3 mL Nebulization TID   levothyroxine  112 mcg Oral QAC breakfast   loratadine  10 mg Oral Daily   macitentan  10 mg Oral Daily   midodrine  10 mg Oral TID WC   mometasone-formoterol  2 puff Inhalation BID   montelukast  10 mg Oral QHS   pantoprazole  40 mg Oral BID   potassium chloride  20 mEq Oral BID   sildenafil  40 mg Oral TID   simvastatin  10 mg Oral QHS   spironolactone  12.5 mg Oral Daily   Continuous Infusions:  PRN Meds: acetaminophen **OR** acetaminophen, albuterol, ondansetron **OR** ondansetron (ZOFRAN) IV   Vital Signs    Vitals:   12/19/19 0637 12/19/19 0638 12/19/19 0740 12/19/19 0747  BP:   92/62   Pulse: (!) 54 75    Resp: 17 (!) 21 20   Temp:   (!) 97.1 F (36.2 C)   TempSrc:   Oral   SpO2: 95% 96% 93% 92%  Weight:      Height:        Intake/Output Summary (Last 24 hours) at 12/19/2019 0956 Last data filed at 12/18/2019 2359 Gross per 24 hour  Intake 1466.37 ml  Output 2400 ml  Net -933.63 ml   Filed Weights   12/18/19 0500 12/18/19 0533  Weight: 79.5 kg 79.5 kg    Telemetry    Probable atrial fib - Personally Reviewed  ECG    none - Personally Reviewed  Physical Exam   GEN: No acute distress.   Neck: 7 cm JVD Cardiac: RRR with a loud S2  Respiratory: Clear to auscultation bilaterally. GI: Soft, nontender, non-distended  MS: No edema; No deformity. Neuro:  Nonfocal  Psych: Normal affect   Labs    Chemistry Recent Labs  Lab 12/17/19 2314 12/18/19 0135 12/18/19 0531 12/18/19 1737 12/19/19 0213  NA   < >  --  135 129* 137  K   < >  --  2.7* 3.2* 3.3*  CL   < >  --   94* 93* 97*  CO2   < >  --  28 26 30   GLUCOSE   < >  --  101* 122* 105*  BUN   < >  --  25* 27* 27*  CREATININE   < >  --  1.38* 1.40* 1.51*  CALCIUM   < >  --  8.7* 8.2* 8.4*  PROT  --  7.0  --   --   --   ALBUMIN  --  2.6*  --   --  2.6*  AST  --  19  --   --   --   ALT  --  11  --   --   --   ALKPHOS  --  67  --   --   --   BILITOT  --  0.6  --   --   --   GFRNONAA   < >  --  43* 43* 39*  GFRAA   < >  --  50* 49* 45*  ANIONGAP   < >  --  13 10 10    < > = values in this interval not displayed.     Hematology Recent Labs  Lab 12/17/19 2314 12/17/19 2314 12/18/19 0531 12/18/19 1103 12/19/19 0213  WBC 12.2*  --  9.7  --  10.0  RBC 4.39   < > 4.11* 4.02* 3.78*  HGB 10.4*  --  9.9*  --  9.2*  HCT 34.8*  --  32.4*  --  29.5*  MCV 79.3*  --  78.8*  --  78.0*  MCH 23.7*  --  24.1*  --  24.3*  MCHC 29.9*  --  30.6  --  31.2  RDW 22.3*  --  22.1*  --  21.6*  PLT 409*  --  357  --  343   < > = values in this interval not displayed.    Cardiac EnzymesNo results for input(s): TROPONINI in the last 168 hours. No results for input(s): TROPIPOC in the last 168 hours.   BNP Recent Labs  Lab 12/17/19 2314  BNP 670.0*     DDimer No results for input(s): DDIMER in the last 168 hours.   Radiology    DG Chest Port 1 View  Result Date: 12/18/2019 CLINICAL DATA:  Short of breath, previous tobacco abuse EXAM: PORTABLE CHEST 1 VIEW COMPARISON:  12/17/2019 at 11:21 p.m. FINDINGS: Single frontal view of the chest demonstrates a stable cardiac silhouette. Postsurgical changes from prior CABG. Stable chronic diffuse interstitial scarring and fibrosis. No superimposed airspace disease, effusion, or pneumothorax. IMPRESSION: 1. Chronic interstitial lung disease.  No acute process. Electronically Signed   By: Randa Ngo M.D.   On: 12/18/2019 19:13   DG Chest Port 1 View  Result Date: 12/17/2019 CLINICAL DATA:  Shortness of breath EXAM: PORTABLE CHEST 1 VIEW COMPARISON:  11/22/2019,  10/18/2017 FINDINGS: Post sternotomy changes. Diffuse coarse interstitial opacity consistent with chronic disease. No acute consolidation, pleural effusion or pneumothorax. Cardiomegaly with mild central congestion. IMPRESSION: 1. Cardiomegaly with mild central congestion. 2. Diffuse interstitial opacity consistent with chronic disease. No acute airspace disease. Electronically Signed   By: Donavan Foil M.D.   On: 12/17/2019 23:40   VAS [place] LOWER EXTREMITY VENOUS (DVT)  Result Date: 12/18/2019  Lower Venous DVTStudy Indications: Edema, and SOB.  Risk Factors: CAD, CHF, HTN, AAA. Anticoagulation: Lovenox. Comparison Study: No prior exam. Performing Technologist: Baldwin Crown ARDMS, RVT  Examination Guidelines: A complete evaluation includes B-mode imaging, spectral Doppler, color Doppler, and power Doppler as needed of all accessible portions of each vessel. Bilateral testing is considered an integral part of a complete examination. Limited examinations for reoccurring indications may be performed as noted. The reflux portion of the exam is performed with the patient in reverse Trendelenburg.  +---------+---------------+---------+-----------+----------+--------------+  RIGHT     Compressibility Phasicity Spontaneity Properties Thrombus Aging  +---------+---------------+---------+-----------+----------+--------------+  CFV       Full            Yes       Yes                                    +---------+---------------+---------+-----------+----------+--------------+  SFJ       Full                                                             +---------+---------------+---------+-----------+----------+--------------+  FV Prox   Full                                                             +---------+---------------+---------+-----------+----------+--------------+  FV Mid    Full                                                              +---------+---------------+---------+-----------+----------+--------------+  FV Distal Full                                                             +---------+---------------+---------+-----------+----------+--------------+  PFV       Full                                                             +---------+---------------+---------+-----------+----------+--------------+  POP       Full            Yes       Yes                                    +---------+---------------+---------+-----------+----------+--------------+  PTV       Full                                                             +---------+---------------+---------+-----------+----------+--------------+  PERO      Full                                                             +---------+---------------+---------+-----------+----------+--------------+   +---------+---------------+---------+-----------+----------+--------------+  LEFT      Compressibility Phasicity Spontaneity Properties Thrombus Aging  +---------+---------------+---------+-----------+----------+--------------+  CFV       Full            Yes       Yes                                    +---------+---------------+---------+-----------+----------+--------------+  SFJ       Full                                                             +---------+---------------+---------+-----------+----------+--------------+  FV Prox   Full                                                             +---------+---------------+---------+-----------+----------+--------------+  FV Mid    Full                                                             +---------+---------------+---------+-----------+----------+--------------+  FV Distal Full                                                             +---------+---------------+---------+-----------+----------+--------------+  PFV       Full                                                              +---------+---------------+---------+-----------+----------+--------------+  POP       Full            Yes       Yes                                    +---------+---------------+---------+-----------+----------+--------------+  PTV       Full                                                             +---------+---------------+---------+-----------+----------+--------------+  PERO      Full                                                             +---------+---------------+---------+-----------+----------+--------------+     Summary: BILATERAL: - No evidence of deep vein thrombosis seen in the lower extremities, bilaterally.  RIGHT: - No cystic structure found in the popliteal fossa.  LEFT: - No cystic structure found in the popliteal fossa.  *See table(s) above for measurements and observations. Electronically signed by Servando Snare MD on 12/18/2019 at 3:23:03 PM.    Final     Cardiac Studies   none  Patient Profile     86 y.o. male admitted with acute on chronic HFpEF.   Assessment & Plan    1. HFpEF - the patient is clinically improved. We do not have a weight. His renal function slightly worse but stable. We will try to get some daily weights.  2. Acute on chronic stage 2 renal insufficiency - we will follow and continue gentle diuresis 3. Hypokalemia - replete potassium.  4. Cor pulmonale - has has been treated with sildanefil 40 tid. Continue.     For questions or updates, please contact [place] Please consult [email or web address] for contact info under Cardiology/STEMI.   Signed, Cristopher Peru, MD  12/19/2019, 9:56 AM  Patient ID: Vincent Robinson, male   DOB: [birth year], 86 y.o.   MRN: [id number]

## 2019-12-19 NOTE — Evaluation (Addendum)
Physical Therapy Evaluation Patient Details Name: Vincent Robinson MRN: [id number] DOB: 08-14-1923 Today's Date: 12/19/2019   History of Present Illness  86 y.o. male with history of pulmonary hypertension, moderate aortic stenosis, CAD status post CABG, abdominal aortic aneurysm status post repair, interstitial lung disease/COPD/asthma with chronic respiratory failure, severe obstructive sleep apnea on BiPAP who presents to the ER due to worsening shortness of breath over the last 2 days.  Patient also noted increasing lower extremity edema.  Denies any chest pain. Of note pt with recent admit 1/31-2/3 for acute on chronic respiratory failure.   Clinical Impression  Prior to admission, pt reports using a Rollator for mobility and is independent with ADL's following his recent discharge home from hospital. Pt presents with decreased functional mobility secondary to decreased cardiopulmonary endurance and weakness. Session focused on warm up exercise and then pt able to ambulate 150 feet with a Rollator. Overall, pt is moving fairly well but still has high oxygen requirements. SpO2 89-97% on 8L HFNC. Pulling 1250 on IS. Will benefit from follow up HHPT to address deficits and maximize functional independence.     Follow Up Recommendations Home health PT;Supervision for mobility/OOB    Equipment Recommendations  None recommended by PT    Recommendations for Other Services       Precautions / Restrictions Precautions Precautions: Fall Precaution Comments: monitor vitals Restrictions Weight Bearing Restrictions: No      Mobility  Bed Mobility Overal bed mobility: Needs Assistance Bed Mobility: Supine to Sit     Supine to sit: Min guard     General bed mobility comments: OOB in chair  Transfers Overall transfer level: Needs assistance Equipment used: 4-wheeled walker Transfers: Sit to/from Stand Sit to Stand: Supervision         General transfer comment: close minguard for safety  and immediate standing balance, cues for safe UE placement, sit<>stand x2 from EOB  Ambulation/Gait Ambulation/Gait assistance: Min guard Gait Distance (Feet): 150 Feet Assistive device: 4-wheeled walker Gait Pattern/deviations: Step-through pattern;Decreased stride length;Trunk flexed Gait velocity: decreased   General Gait Details: Pt requiring min guard for negotiating tight spaces, no gross unsteadiness  Stairs            Wheelchair Mobility    Modified Rankin (Stroke Patients Only)       Balance Overall balance assessment: Needs assistance Sitting-balance support: Feet supported Sitting balance-Leahy Scale: Good     Standing balance support: Bilateral upper extremity supported;No upper extremity supported Standing balance-Leahy Scale: Fair Standing balance comment: able to maintain static balance without UE support                             Pertinent Vitals/Pain Pain Assessment: No/denies pain    Home Living Family/patient expects to be discharged to:: Private residence Living Arrangements: Spouse/significant other Available Help at Discharge: Family;Available 24 hours/day Type of Home: House Home Access: Stairs to enter   [employer] of Steps: 1 Home Layout: One level Home Equipment: Walker - 2 wheels;Cane - single point      Prior Function Level of Independence: Independent with assistive device(s)         Comments: using Rollator since most recent discharge home     Hand Dominance   Dominant Hand: Right    Extremity/Trunk Assessment   Upper Extremity Assessment Upper Extremity Assessment: Generalized weakness    Lower Extremity Assessment Lower Extremity Assessment: Generalized weakness    Cervical / Trunk Assessment  Cervical / Trunk Assessment: Kyphotic  Communication   Communication: Other (comment)(mild HOH)  Cognition Arousal/Alertness: Awake/alert Behavior During Therapy: WFL for tasks  assessed/performed Overall Cognitive Status: Within Functional Limits for tasks assessed                                 General Comments: for basic tasks assessed today      General Comments General comments (skin integrity, edema, etc.): HR fluctuating 102-130 with activity, SpO2 >/=87% on 8L HFNC, BP soft but stable, initially 101/52 (68) in supine, 92/58 (69) seated EOB, 96/59 (73) post standing activity, pt denies dizziness    Exercises General Exercises - Upper Extremity Shoulder Flexion: AROM;Both;10 reps;Seated General Exercises - Lower Extremity Long Arc Quad: Both;10 reps;Seated Hip Flexion/Marching: Both;10 reps;Seated   Assessment/Plan    PT Assessment Patient needs continued PT services  PT Problem List Decreased strength;Decreased activity tolerance;Decreased balance;Decreased mobility;Cardiopulmonary status limiting activity       PT Treatment Interventions DME instruction;Gait training;Stair training;Functional mobility training;Therapeutic activities;Therapeutic exercise;Balance training;Patient/family education    PT Goals (Current goals can be found in the Care Plan section)  Acute Rehab PT Goals Patient Stated Goal: maintain his strength and independence PT Goal Formulation: With patient Time For Goal Achievement: 01/02/20 Potential to Achieve Goals: Good    Frequency Min 3X/week   Barriers to discharge        Co-evaluation               AM-PAC PT "6 Clicks" Mobility  Outcome Measure Help needed turning from your back to your side while in a flat bed without using bedrails?: None Help needed moving from lying on your back to sitting on the side of a flat bed without using bedrails?: None Help needed moving to and from a bed to a chair (including a wheelchair)?: None Help needed standing up from a chair using your arms (e.g., wheelchair or bedside chair)?: None Help needed to walk in hospital room?: A Little Help needed climbing  3-5 steps with a railing? : A Little 6 Click Score: 22    End of Session Equipment Utilized During Treatment: Oxygen Activity Tolerance: Patient tolerated treatment well Patient left: in chair;with call bell/phone within reach Nurse Communication: Mobility status PT Visit Diagnosis: Muscle weakness (generalized) (M62.81);Difficulty in walking, not elsewhere classified (R26.2)    Time: [id number] PT Time Calculation (min) (ACUTE ONLY): 28 min   Charges:   PT Evaluation $PT Eval Moderate Complexity: 1 Mod PT Treatments $Therapeutic Activity: 8-22 mins          Wyona Almas, PT, DPT Acute Rehabilitation Services Pager [phone] Office [phone]   Vincent Robinson 12/19/2019, 11:09 AM

## 2019-12-20 LAB — RENAL FUNCTION PANEL
Albumin: 2.7 g/dL — ABNORMAL LOW (ref 3.5–5.0)
Anion gap: 11 (ref 5–15)
BUN: 24 mg/dL — ABNORMAL HIGH (ref 8–23)
CO2: 28 mmol/L (ref 22–32)
Calcium: 8.5 mg/dL — ABNORMAL LOW (ref 8.9–10.3)
Chloride: 94 mmol/L — ABNORMAL LOW (ref 98–111)
Creatinine, Ser: 1.44 mg/dL — ABNORMAL HIGH (ref 0.61–1.24)
GFR calc Af Amer: 48 mL/min — ABNORMAL LOW (ref >60)
GFR calc non Af Amer: 41 mL/min — ABNORMAL LOW (ref >60)
Glucose, Bld: 94 mg/dL (ref 70–99)
Phosphorus: 4 mg/dL (ref 2.5–4.6)
Potassium: 3.5 mmol/L (ref 3.5–5.1)
Sodium: 133 mmol/L — ABNORMAL LOW (ref 135–145)

## 2019-12-20 LAB — CBC
HCT: 30.4 % — ABNORMAL LOW (ref 39.0–52.0)
Hemoglobin: 9.4 g/dL — ABNORMAL LOW (ref 13.0–17.0)
MCH: 24.2 pg — ABNORMAL LOW (ref 26.0–34.0)
MCHC: 30.9 g/dL (ref 30.0–36.0)
MCV: 78.1 fL — ABNORMAL LOW (ref 80.0–100.0)
Platelets: 372 10*3/uL (ref 150–400)
RBC: 3.89 MIL/uL — ABNORMAL LOW (ref 4.22–5.81)
RDW: 21.7 % — ABNORMAL HIGH (ref 11.5–15.5)
WBC: 9.8 10*3/uL (ref 4.0–10.5)
nRBC: 0.2 % (ref 0.0–0.2)

## 2019-12-20 LAB — MAGNESIUM: Magnesium: 2 mg/dL (ref 1.7–2.4)

## 2019-12-20 NOTE — Progress Notes (Addendum)
PROGRESS NOTE  Vincent Robinson [id number] DOB: [birth year]   PCP: Katherina Mires, MD  Patient is from: Home.  Lives with his wife.  Uses walker at baseline  DOA: 12/17/2019 LOS: 2  Brief Narrative / Interim history: 86 year old male with history of CAD/CABG, diastolic CHF, pulm HTN, COPD/chronic RF on 3 L, moderate AS, AAA s/p endovascular repair, A. fib not on anticoagulation due to GIB and hypertension presenting with increased shortness of breath and edema for 2 days, and 6 pound weight gain in about a week.  In ED, soft blood pressures.  Slightly tachycardic.  Desaturated to 88% requiring 6 L. Na 134.  K3.2.  Cr 1.3 (1.27 on 2/11).  BUN 27.  BNP 670 (higher than b/l).  HS Trop 25> 40.  Lactic acid negative.  WBC 12.2.  HTN 0.4.  CXR concerning for CHF.  COVID-19 antigen and PCR negative.  EKG with TW changes in inferior and anterior leads and RBBB.  Patient was admitted for acute on chronic respiratory failure with hypoxia due to diastolic CHF exacerbation.  Started on IV Lasix 40 mg twice daily.  Assessment & Plan: Acute on chronic respiratory failure due to right-sided CHF exacerbation with preserved ejection fraction-presented with dyspnea and lower extremity edema.  Chest x-ray concerning for CHF.  Echo on 11/23/2019 with LVEF >75%, G1 DD, severely reduced RVEF and severe pulm HTN. On 3 L at baseline at home however currently on 8 L of high flow.  ABG reassuring.-Wean oxygen as able-Encourage incentive spirometry.  Total output in last 24 hours 1950 and net -2283 since admission.  Continue Lasix IV 40 mg twice daily.  Managed by cardiology.  Appreciate their help.  Hypotension: Still with soft blood pressures but much improved from before. -Continue home midodrine-not ideal given his heart failure. Could raise afterload -Appreciate cardiology guidance  Paroxysmal A. Fib: Rate controlled without medication.  Not on anticoagulation due to GI bleed-Continue monitoring  Chronic COPD: Doubt  exacerbation.  Recently treated for bronchitis with antibiotic.  Procalcitonin negative. ABG reassuring. -Discontinued doxycycline -Continue breathing treatments -Wean oxygen as able  History of CAD/CABG: No chest pain.  Slightly elevated troponin likely demand ischemia from CHF. Continue home aspirin and statin  Iron deficiency anemia: B/l Hgb 9-10> 10.4 (admit)> 9.9> 9.2> 4.  Iron sat 7%.  Ferritin 15.  History of GIB but denies melena or hematochezia. -IV Feraheme on 2/26.  Additional dose in 3 to 4 days -Continue p.o. iron -Check FOBT, pending. -Continue monitoring  AKI on CKD-3a/azotemia: B/l Cr ~1.1> 1.27 (on 2/11)> 1.33> 1.38> 1.51> 1.44 -Improving.  Continue monitoring  Hypothyroidism: TSH within normal -Continue home Synthroid  AAA s/p endovascular repair -Continue started on aspirin  Hypokalemia: Resolved.   DVT prophylaxis: Subcu Lovenox Code Status: Partial code.  No CPR or intubation but NIPPV/ACL meds/DCCV Family Communication: No family present.  Patient alert and oriented and competent.  Plan of care discussed with patient.  I called patient's daughter Neoma Laming later on.  Updated her about plan of care and current status.  She requested palliative care consult.  Discharge barrier: Acute respiratory failure with decompensated CHF requiring high amount of oxygen Patient is from: Home Final disposition: Home with home health/24-hour supervision  Consultants: Cardiology   Subjective: Patient seen and examined.  No complaints.  Objective: Vitals:   12/20/19 0027 12/20/19 0404 12/20/19 0755 12/20/19 0902  BP: (!) 97/48 (!) 89/54 (!) 109/54   Pulse: 68 73 83   Resp: 20 16 (!) 22  Temp: 98.1 F (36.7 C) 98.2 F (36.8 C) (!) 97.5 F (36.4 C)   TempSrc: Axillary Axillary Axillary   SpO2: 97% 97% 90% 92%  Weight:  78 kg    Height:        Intake/Output Summary (Last 24 hours) at 12/20/2019 1108 Last data filed at 12/20/2019 [id number] Gross per 24 hour  Intake  480 ml  Output 1950 ml  Net -1470 ml   Filed Weights   12/18/19 0500 12/18/19 0533 12/20/19 0404  Weight: 79.5 kg 79.5 kg 78 kg    Examination:  General exam: Appears calm and comfortable  Respiratory system: Diminished breath sounds at the bases but otherwise clear.  Respiratory effort normal. Cardiovascular system: S1 & S2 heard, RRR. No JVD, murmurs, rubs, gallops or clicks. No pedal edema. Gastrointestinal system: Abdomen is nondistended, soft and nontender. No organomegaly or masses felt. Normal bowel sounds heard. Central nervous system: Alert and oriented. No focal neurological deficits. Extremities: Symmetric 5 x 5 power. Skin: No rashes, lesions or ulcers.  Psychiatry: Judgement and insight appear normal. Mood & affect appropriate.   Procedures:  None   Microbiology summarized: COVID-19 PCR negative  Sch Meds:  Scheduled Meds: . aspirin  81 mg Oral Daily  . ferrous sulfate  325 mg Oral BID WC  . fluticasone  1 spray Each Nare Daily  . furosemide  40 mg Intravenous BID  . ipratropium-albuterol  3 mL Nebulization TID  . levothyroxine  112 mcg Oral QAC breakfast  . loratadine  10 mg Oral Daily  . macitentan  10 mg Oral Daily  . midodrine  10 mg Oral TID WC  . mometasone-formoterol  2 puff Inhalation BID  . montelukast  10 mg Oral QHS  . pantoprazole  40 mg Oral BID  . potassium chloride  20 mEq Oral BID  . sildenafil  40 mg Oral TID  . simvastatin  10 mg Oral QHS  . spironolactone  12.5 mg Oral Daily   Continuous Infusions:  PRN Meds:.acetaminophen **OR** acetaminophen, albuterol, ondansetron **OR** ondansetron (ZOFRAN) IV  Antimicrobials: Anti-infectives (From admission, onward)   Start     Dose/Rate Route Frequency Ordered Stop   12/18/19 0630  doxycycline (VIBRAMYCIN) 100 mg in sodium chloride 0.9 % 250 mL IVPB  Status:  Discontinued     100 mg 125 mL/hr over 120 Minutes Intravenous 2 times daily 12/18/19 0628 12/18/19 1042       I have personally  reviewed the following labs and images: CBC: Recent Labs  Lab 12/17/19 2314 12/18/19 0531 12/19/19 0213 12/20/19 0317  WBC 12.2* 9.7 10.0 9.8  NEUTROABS 9.4* 6.5  --   --   HGB 10.4* 9.9* 9.2* 9.4*  HCT 34.8* 32.4* 29.5* 30.4*  MCV 79.3* 78.8* 78.0* 78.1*  PLT 409* 357 343 372   BMP &GFR Recent Labs  Lab 12/17/19 2314 12/18/19 0531 12/18/19 1737 12/19/19 0213 12/20/19 0317  NA 134* 135 129* 137 133*  K 3.2* 2.7* 3.2* 3.3* 3.5  CL 98 94* 93* 97* 94*  CO2 21* 28 26 30 28   GLUCOSE 128* 101* 122* 105* 94  BUN 27* 25* 27* 27* 24*  CREATININE 1.33* 1.38* 1.40* 1.51* 1.44*  CALCIUM 8.6* 8.7* 8.2* 8.4* 8.5*  MG  --  1.8  --  1.9 2.0  PHOS  --   --   --  4.3 4.0   Estimated Creatinine Clearance: 29.9 mL/min (A) (by C-G formula based on SCr of 1.44 mg/dL (H)). Liver & Pancreas:  Recent Labs  Lab 12/18/19 0135 12/19/19 0213 12/20/19 0317  AST 19  --   --   ALT 11  --   --   ALKPHOS 67  --   --   BILITOT 0.6  --   --   PROT 7.0  --   --   ALBUMIN 2.6* 2.6* 2.7*   No results for input(s): LIPASE, AMYLASE in the last 168 hours. No results for input(s): AMMONIA in the last 168 hours. Diabetic: No results for input(s): HGBA1C in the last 72 hours. No results for input(s): GLUCAP in the last 168 hours. Cardiac Enzymes: No results for input(s): CKTOTAL, CKMB, CKMBINDEX, TROPONINI in the last 168 hours. No results for input(s): PROBNP in the last 8760 hours. Coagulation Profile: No results for input(s): INR, PROTIME in the last 168 hours. Thyroid Function Tests: Recent Labs    12/18/19 0531  TSH 2.890   Lipid Profile: No results for input(s): CHOL, HDL, LDLCALC, TRIG, CHOLHDL, LDLDIRECT in the last 72 hours. Anemia Panel: Recent Labs    12/18/19 1103  VITAMINB12 266  FOLATE 9.7  FERRITIN 15*  TIBC 353  IRON 23*  RETICCTPCT 1.9   Urine analysis: No results found for: COLORURINE, APPEARANCEUR, LABSPEC, PHURINE, GLUCOSEU, HGBUR, BILIRUBINUR, KETONESUR,  PROTEINUR, UROBILINOGEN, NITRITE, LEUKOCYTESUR Sepsis Labs: Invalid input(s): PROCALCITONIN, [place]  Microbiology: Recent Results (from the past 240 hour(s))  SARS CORONAVIRUS 2 (TAT 6-24 HRS) Nasopharyngeal Nasopharyngeal Swab     Status: None   Collection Time: 12/18/19  2:37 AM   Specimen: Nasopharyngeal Swab  Result Value Ref Range Status   SARS Coronavirus 2 NEGATIVE NEGATIVE Final    Comment: (NOTE) SARS-CoV-2 target nucleic acids are NOT DETECTED. The SARS-CoV-2 RNA is generally detectable in upper and lower respiratory specimens during the acute phase of infection. Negative results do not preclude SARS-CoV-2 infection, do not rule out co-infections with other pathogens, and should not be used as the sole basis for treatment or other patient management decisions. Negative results must be combined with clinical observations, patient history, and epidemiological information. The expected result is Negative. Fact Sheet for Patients: SugarRoll.be Fact Sheet for Healthcare Providers: https://www.woods-mathews.com/ This test is not yet approved or cleared by the [place] FDA and  has been authorized for detection and/or diagnosis of SARS-CoV-2 by FDA under an Emergency Use Authorization (EUA). This EUA will remain  in effect (meaning this test can be used) for the duration of the COVID-19 declaration under Section 56 4(b)(1) of the Act, 21 U.S.C. section 360bbb-3(b)(1), unless the authorization is terminated or revoked sooner. Performed at [facility], [place] [address] [place], [place] [address]     Radiology Studies: No results found.   Darliss Cheney, MD [facility]  If 7PM-7AM, please contact night-coverage [email or web address] 12/20/2019, 11:08 AM

## 2019-12-20 NOTE — Progress Notes (Signed)
Progress Note  Patient Name: Vincent Robinson Date of Encounter: 12/20/2019  Primary Cardiologist: Quay Burow, MD   Subjective   Dyspnea improved. "I can't keep my oxygen level up without 8 liters." Denies chest pain.   Inpatient Medications    Scheduled Meds: . aspirin  81 mg Oral Daily  . ferrous sulfate  325 mg Oral BID WC  . fluticasone  1 spray Each Nare Daily  . furosemide  40 mg Intravenous BID  . ipratropium-albuterol  3 mL Nebulization TID  . levothyroxine  112 mcg Oral QAC breakfast  . loratadine  10 mg Oral Daily  . macitentan  10 mg Oral Daily  . midodrine  10 mg Oral TID WC  . mometasone-formoterol  2 puff Inhalation BID  . montelukast  10 mg Oral QHS  . pantoprazole  40 mg Oral BID  . potassium chloride  20 mEq Oral BID  . sildenafil  40 mg Oral TID  . simvastatin  10 mg Oral QHS  . spironolactone  12.5 mg Oral Daily   Continuous Infusions:  PRN Meds: acetaminophen **OR** acetaminophen, albuterol, ondansetron **OR** ondansetron (ZOFRAN) IV   Vital Signs    Vitals:   12/20/19 0027 12/20/19 0404 12/20/19 0755 12/20/19 0902  BP: (!) 97/48 (!) 89/54 (!) 109/54   Pulse: 68 73 83   Resp: 20 16 (!) 22   Temp: 98.1 F (36.7 C) 98.2 F (36.8 C) (!) 97.5 F (36.4 C)   TempSrc: Axillary Axillary Axillary   SpO2: 97% 97% 90% 92%  Weight:  78 kg    Height:        Intake/Output Summary (Last 24 hours) at 12/20/2019 0933 Last data filed at 12/20/2019 [id number] Gross per 24 hour  Intake 480 ml  Output 1950 ml  Net -1470 ml   Filed Weights   12/18/19 0500 12/18/19 0533 12/20/19 0404  Weight: 79.5 kg 79.5 kg 78 kg    Telemetry    nsr - Personally Reviewed  ECG    none - Personally Reviewed  Physical Exam   GEN: elderly man, no acute distress.   Neck: 7 cm JVD Cardiac: RRR, 2/6 systolic murmurs, rubs, or gallops.  Respiratory: Clear to auscultation bilaterally. Breath sounds reduced.  GI: Soft, nontender, non-distended  MS: No edema; No  deformity. Neuro:  Nonfocal  Psych: Normal affect   Labs    Chemistry Recent Labs  Lab 12/18/19 0135 12/18/19 0531 12/18/19 1737 12/19/19 0213 12/20/19 0317  NA  --    < > 129* 137 133*  K  --    < > 3.2* 3.3* 3.5  CL  --    < > 93* 97* 94*  CO2  --    < > 26 30 28   GLUCOSE  --    < > 122* 105* 94  BUN  --    < > 27* 27* 24*  CREATININE  --    < > 1.40* 1.51* 1.44*  CALCIUM  --    < > 8.2* 8.4* 8.5*  PROT 7.0  --   --   --   --   ALBUMIN 2.6*  --   --  2.6* 2.7*  AST 19  --   --   --   --   ALT 11  --   --   --   --   ALKPHOS 67  --   --   --   --   BILITOT 0.6  --   --   --   --  GFRNONAA  --    < > 43* 39* 41*  GFRAA  --    < > 49* 45* 48*  ANIONGAP  --    < > 10 10 11    < > = values in this interval not displayed.     Hematology Recent Labs  Lab 12/18/19 0531 12/18/19 1103 12/19/19 0213 12/20/19 0317  WBC 9.7  --  10.0 9.8  RBC 4.11* 4.02* 3.78* 3.89*  HGB 9.9*  --  9.2* 9.4*  HCT 32.4*  --  29.5* 30.4*  MCV 78.8*  --  78.0* 78.1*  MCH 24.1*  --  24.3* 24.2*  MCHC 30.6  --  31.2 30.9  RDW 22.1*  --  21.6* 21.7*  PLT 357  --  343 372    Cardiac EnzymesNo results for input(s): TROPONINI in the last 168 hours. No results for input(s): TROPIPOC in the last 168 hours.   BNP Recent Labs  Lab 12/17/19 2314  BNP 670.0*     DDimer No results for input(s): DDIMER in the last 168 hours.   Radiology    DG Chest Port 1 View  Result Date: 12/18/2019 CLINICAL DATA:  Short of breath, previous tobacco abuse EXAM: PORTABLE CHEST 1 VIEW COMPARISON:  12/17/2019 at 11:21 p.m. FINDINGS: Single frontal view of the chest demonstrates a stable cardiac silhouette. Postsurgical changes from prior CABG. Stable chronic diffuse interstitial scarring and fibrosis. No superimposed airspace disease, effusion, or pneumothorax. IMPRESSION: 1. Chronic interstitial lung disease.  No acute process. Electronically Signed   By: Randa Ngo M.D.   On: 12/18/2019 19:13   VAS [place]  LOWER EXTREMITY VENOUS (DVT)  Result Date: 12/18/2019  Lower Venous DVTStudy Indications: Edema, and SOB.  Risk Factors: CAD, CHF, HTN, AAA. Anticoagulation: Lovenox. Comparison Study: No prior exam. Performing Technologist: Baldwin Crown ARDMS, RVT  Examination Guidelines: A complete evaluation includes B-mode imaging, spectral Doppler, color Doppler, and power Doppler as needed of all accessible portions of each vessel. Bilateral testing is considered an integral part of a complete examination. Limited examinations for reoccurring indications may be performed as noted. The reflux portion of the exam is performed with the patient in reverse Trendelenburg.  +---------+---------------+---------+-----------+----------+--------------+ RIGHT    CompressibilityPhasicitySpontaneityPropertiesThrombus Aging +---------+---------------+---------+-----------+----------+--------------+ CFV      Full           Yes      Yes                                 +---------+---------------+---------+-----------+----------+--------------+ SFJ      Full                                                        +---------+---------------+---------+-----------+----------+--------------+ FV Prox  Full                                                        +---------+---------------+---------+-----------+----------+--------------+ FV Mid   Full                                                        +---------+---------------+---------+-----------+----------+--------------+  FV DistalFull                                                        +---------+---------------+---------+-----------+----------+--------------+ PFV      Full                                                        +---------+---------------+---------+-----------+----------+--------------+ POP      Full           Yes      Yes                                  +---------+---------------+---------+-----------+----------+--------------+ PTV      Full                                                        +---------+---------------+---------+-----------+----------+--------------+ PERO     Full                                                        +---------+---------------+---------+-----------+----------+--------------+   +---------+---------------+---------+-----------+----------+--------------+ LEFT     CompressibilityPhasicitySpontaneityPropertiesThrombus Aging +---------+---------------+---------+-----------+----------+--------------+ CFV      Full           Yes      Yes                                 +---------+---------------+---------+-----------+----------+--------------+ SFJ      Full                                                        +---------+---------------+---------+-----------+----------+--------------+ FV Prox  Full                                                        +---------+---------------+---------+-----------+----------+--------------+ FV Mid   Full                                                        +---------+---------------+---------+-----------+----------+--------------+ FV DistalFull                                                        +---------+---------------+---------+-----------+----------+--------------+  PFV      Full                                                        +---------+---------------+---------+-----------+----------+--------------+ POP      Full           Yes      Yes                                 +---------+---------------+---------+-----------+----------+--------------+ PTV      Full                                                        +---------+---------------+---------+-----------+----------+--------------+ PERO     Full                                                         +---------+---------------+---------+-----------+----------+--------------+     Summary: BILATERAL: - No evidence of deep vein thrombosis seen in the lower extremities, bilaterally.  RIGHT: - No cystic structure found in the popliteal fossa.  LEFT: - No cystic structure found in the popliteal fossa.  *See table(s) above for measurements and observations. Electronically signed by Servando Snare MD on 12/18/2019 at 3:23:03 PM.    Final     Cardiac Studies   none  Patient Profile     86 y.o. male admitted with chest pain, sob, and volume overload.  Assessment & Plan    1. Hypoxemic respiratory failure - he is maintaining sats in the low 90's on 8 liters of oxygen by [place]. I suspect he will tolerate sats in the high 80's. This will be his biggest issue at discharge. He has a concentrator at home that delivers 4 liters. 2. Acute on chronic right more than left sided heart failure - His weight is down a Kg. Continue diuresis. He has known end stage right heart failure/cor pulmonale 3. Acute on chronic renal failure - his creatinine went from 1.5 to 1.4 today. Continue IV laisx. 4. Disp. - he is probably a day or two from optimal volume status. Hopefully his oxygen requirements will improve.     For questions or updates, please contact [place] Please consult [email or web address] for contact info under Cardiology/STEMI.      Signed, Cristopher Peru, MD  12/20/2019, 9:33 AM  Patient ID: Vincent Robinson, male   DOB: 04-01-1923, 86 y.o.   MRN: [id number]

## 2019-12-21 ENCOUNTER — Inpatient Hospital Stay (HOSPITAL_COMMUNITY): Payer: Medicare Other

## 2019-12-21 LAB — CBC
HCT: 30 % — ABNORMAL LOW (ref 39.0–52.0)
Hemoglobin: 9.3 g/dL — ABNORMAL LOW (ref 13.0–17.0)
MCH: 24.1 pg — ABNORMAL LOW (ref 26.0–34.0)
MCHC: 31 g/dL (ref 30.0–36.0)
MCV: 77.7 fL — ABNORMAL LOW (ref 80.0–100.0)
Platelets: 340 10*3/uL (ref 150–400)
RBC: 3.86 MIL/uL — ABNORMAL LOW (ref 4.22–5.81)
RDW: 21.7 % — ABNORMAL HIGH (ref 11.5–15.5)
WBC: 10 10*3/uL (ref 4.0–10.5)
nRBC: 0.3 % — ABNORMAL HIGH (ref 0.0–0.2)

## 2019-12-21 LAB — RENAL FUNCTION PANEL
Albumin: 2.6 g/dL — ABNORMAL LOW (ref 3.5–5.0)
Anion gap: 13 (ref 5–15)
BUN: 22 mg/dL (ref 8–23)
CO2: 30 mmol/L (ref 22–32)
Calcium: 8.6 mg/dL — ABNORMAL LOW (ref 8.9–10.3)
Chloride: 92 mmol/L — ABNORMAL LOW (ref 98–111)
Creatinine, Ser: 1.23 mg/dL (ref 0.61–1.24)
GFR calc Af Amer: 58 mL/min — ABNORMAL LOW (ref >60)
GFR calc non Af Amer: 50 mL/min — ABNORMAL LOW (ref >60)
Glucose, Bld: 106 mg/dL — ABNORMAL HIGH (ref 70–99)
Phosphorus: 3.5 mg/dL (ref 2.5–4.6)
Potassium: 4 mmol/L (ref 3.5–5.1)
Sodium: 135 mmol/L (ref 135–145)

## 2019-12-21 LAB — MAGNESIUM: Magnesium: 2 mg/dL (ref 1.7–2.4)

## 2019-12-21 MED ORDER — TORSEMIDE 20 MG PO TABS
40.0000 mg | ORAL_TABLET | Freq: Every day | ORAL | Status: DC
  Administered 2019-12-22 – 2019-12-23 (×2): 40 mg via ORAL
  Filled 2019-12-21 (×3): qty 2

## 2019-12-21 MED ORDER — TORSEMIDE 20 MG PO TABS
20.0000 mg | ORAL_TABLET | Freq: Every day | ORAL | Status: DC
  Administered 2019-12-21 – 2019-12-22 (×2): 20 mg via ORAL
  Filled 2019-12-21: qty 1

## 2019-12-21 MED ORDER — FUROSEMIDE 80 MG PO TABS: 80.0000 mg | ORAL_TABLET | Freq: Every day | ORAL | Status: DC

## 2019-12-21 NOTE — Care Management Important Message (Signed)
Important Message  Patient Details  Name: Vincent Robinson MRN: [id number] Date of Birth: 21-May-1923   Medicare Important Message Given:  Yes     [name] Montine Circle 12/21/2019, 12:47 PM

## 2019-12-21 NOTE — Progress Notes (Signed)
Occupational Therapy Treatment Patient Details Name: Vincent Robinson MRN: [id number] DOB: [birth year] Today's Date: 12/21/2019    History of present illness 86 y.o. male with history of pulmonary hypertension, moderate aortic stenosis, CAD status post CABG, abdominal aortic aneurysm status post repair, interstitial lung disease/COPD/asthma with chronic respiratory failure, severe obstructive sleep apnea on BiPAP who presents to the ER due to worsening shortness of breath over the last 2 days.  Patient also noted increasing lower extremity edema.  Denies any chest pain. Of note pt with recent admit 1/31-2/3 for acute on chronic respiratory failure.    OT comments  Pt needing encouragement to participate in ADL and remain up in chair. Performed grooming, toileting and UB dressing. Sp02 at 91-93% on 6L . Educated in pacing and pursed lip breathing. Pt with portable 02 concentrator that can only manage up to 4L, but he also has a large concentrator and lengthy tubing to get around his [place].   Follow Up Recommendations  Home health OT;Supervision/Assistance - 24 hour    Equipment Recommendations  Tub/shower seat    Recommendations for Other Services      Precautions / Restrictions Precautions Precautions: Fall       Mobility Bed Mobility Overal bed mobility: Modified Independent             General bed mobility comments: HOB up, increased time  Transfers Overall transfer level: Needs assistance Equipment used: Rolling walker (2 wheeled) Transfers: Sit to/from Stand Sit to Stand: Supervision         General transfer comment: supervision for safety and lines    Balance Overall balance assessment: Needs assistance Sitting-balance support: Feet supported Sitting balance-Leahy Scale: Good       Standing balance-Leahy Scale: Fair                             ADL either performed or assessed with clinical judgement   ADL Overall ADL's : Needs assistance/impaired      Grooming: Wash/dry hands;Wash/dry face;Sitting;Set up           Upper Body Dressing : Set up;Sitting       Toilet Transfer: Min guard;Ambulation;RW   Toileting- Water quality scientist and Hygiene: Min guard;Sit to/from stand       Functional mobility during ADLs: Passenger transport manager     Praxis      Cognition Arousal/Alertness: Awake/alert Behavior During Therapy: WFL for tasks assessed/performed Overall Cognitive Status: Within Functional Limits for tasks assessed                                          Exercises     Shoulder Instructions       General Comments      Pertinent Vitals/ Pain       Pain Assessment: No/denies pain  Home Living                                          Prior Functioning/Environment              Frequency  Min 2X/week        Progress Toward Goals  OT Goals(current goals can now be found  in the care plan section)  Progress towards OT goals: Progressing toward goals  Acute Rehab OT Goals Patient Stated Goal: get his 02 needs down to at least 4L OT Goal Formulation: With patient Time For Goal Achievement: 01/02/20 Potential to Achieve Goals: Good  Plan Discharge plan remains appropriate    Co-evaluation                 AM-PAC OT "6 Clicks" Daily Activity     Outcome Measure   Help from another person eating meals?: None Help from another person taking care of personal grooming?: A Little Help from another person toileting, which includes using toliet, bedpan, or urinal?: A Little Help from another person bathing (including washing, rinsing, drying)?: A Little Help from another person to put on and taking off regular upper body clothing?: None Help from another person to put on and taking off regular lower body clothing?: A Little 6 Click Score: 20    End of Session Equipment Utilized During Treatment: Rolling walker;Gait  belt;Oxygen(6L)  OT Visit Diagnosis: Muscle weakness (generalized) (M62.81);Unsteadiness on feet (R26.81)   Activity Tolerance Patient tolerated treatment well   Patient Left in chair;with call bell/phone within reach   Nurse Communication          Time: 1333-1410 OT Time Calculation (min): 37 min  Charges: OT General Charges $OT Visit: 1 Visit OT Treatments $Self Care/Home Management : 23-37 mins  Nestor Lewandowsky, OTR/L Acute Rehabilitation Services Pager: [phone] Office: [phone]   Malka So 12/21/2019, 3:24 PM

## 2019-12-21 NOTE — TOC Initial Note (Signed)
Transition of Care [facility]) - Initial/Assessment Note    Patient Details  Name: Vincent Robinson MRN: [id number] Date of Birth: 1923-09-10  Transition of Care [facility]) CM/SW Contact:    Maryclare Labrador, RN Phone Number: 12/21/2019, 1:22 PM  Clinical Narrative:    PTA independent from home with wife.  Pt confirms wife will provide 24 hour supervision at discharge.  Pt recently discharge.  Pt in agreement with [place] - medicare.gov HH list provided for choice - pt did not have a preference.  [facility] can accept pt - pt in agreement with agency.                 Expected Discharge Plan: [place] Barriers to Discharge: Continued Medical Work up   Patient Goals and CMS Choice Patient states their goals for this hospitalization and ongoing recovery are:: Pt states he is ready to get back home with his wife CMS Medicare.gov Compare Post Acute Care list provided to:: Patient Choice offered to / list presented to : Patient  Expected Discharge Plan and Services Expected Discharge Plan: [place]       Living arrangements for the past 2 months: Single Family Home                           HH Arranged: RN, PT, OT [facility] Agency: [place] Date [place]: 12/21/19 Time [place]: 1322 Representative spoke with at [place]: [place] Arrangements/Services Living arrangements for the past 2 months: [place] with:: Spouse Patient language and need for interpreter reviewed:: Yes Do you feel safe going back to the place where you live?: Yes      Need for Family Participation in Patient Care: Yes (Comment) Care giver support system in place?: Yes (comment)   Criminal Activity/Legal Involvement Pertinent to Current Situation/Hospitalization: No - Comment as needed  Activities of Daily Living Home Assistive Devices/Equipment: Gilford Rile (specify type) ADL Screening (condition at time of  admission) Patient's cognitive ability adequate to safely complete daily activities?: No Is the patient deaf or have difficulty hearing?: Yes Does the patient have difficulty seeing, even when wearing glasses/contacts?: No Does the patient have difficulty concentrating, remembering, or making decisions?: Yes Patient able to express need for assistance with ADLs?: Yes Does the patient have difficulty dressing or bathing?: No Independently performs ADLs?: Yes (appropriate for developmental age) Does the patient have difficulty walking or climbing stairs?: No Weakness of Legs: None Weakness of Arms/Hands: None  Permission Sought/Granted   Permission granted to share information with : Yes, Verbal Permission Granted              Emotional Assessment   Attitude/Demeanor/Rapport: Self-Confident, Engaged Affect (typically observed): Accepting Orientation: : Oriented to Self, Oriented to Place, Oriented to  Time, Oriented to Situation   Psych Involvement: No (comment)  Admission diagnosis:  New onset a-fib ([place]) [I48.91] Acute respiratory failure with hypoxia (HCC) [J96.01] Acute on chronic right-sided heart failure ([place]) [I50.813] Patient Active Problem List   Diagnosis Date Noted  . Acute respiratory failure with hypoxia ([place]) 12/18/2019  . Acute on chronic right-sided heart failure ([place]) 12/18/2019  . Pressure injury of skin 11/24/2019  . Chronic respiratory failure ([place])   . Acute on chronic respiratory failure ([place]) 11/23/2019  . Right heart failure ([place])   . S/P CABG (coronary artery bypass graft) 11/22/2019  . Chest pain 11/22/2019  .  Atrial fibrillation ([place]) 08/04/2019  . Pulmonary hypertension, unspecified ([place]) 10/11/2017  . Aortic stenosis, moderate 10/11/2017  . Coronary artery disease 08/30/2017  . AAA (abdominal aortic aneurysm) without rupture ([place]) 08/30/2017  . Acute blood loss anemia 06/07/2017  . Hemorrhagic shock ([place]) 06/07/2017  . Lower GI bleed  06/05/2017  . Sleep apnea 06/05/2017  . Hypothyroidism 06/05/2017  . Hyperlipidemia 06/05/2017  . Asthma 06/05/2017  . ILD (interstitial lung disease) ([place]) 05/28/2017  . Pneumothorax on right 05/20/2017  . COPD (chronic obstructive pulmonary disease) ([place]) 05/20/2017  . Pneumothorax 05/20/2017  . Skin cancer 10/23/2015   PCP:  Katherina Mires, MD Pharmacy:   [id number] - [place], [address] [place] [address] Phone: [phone] Fax: [phone]     Social Determinants of Health (SDOH) Interventions    Readmission Risk Interventions Readmission Risk Prevention Plan 11/24/2019  Transportation Screening Complete  PCP or Specialist Appt within 5-7 Days Patient refused  Home Care Screening Complete  Medication Review (RN CM) Referral to Pharmacy  Some recent data might be hidden

## 2019-12-21 NOTE — Progress Notes (Addendum)
Advanced Heart Failure Rounding Note  PCP-Cardiologist: Quay Burow, MD   Subjective:   Diuresed with IV lasix. Overall weight down 4 pounds.   Feeling better. Wants to know when oxygen can be weaned down.  Objective:   Weight Range: 78 kg Body mass index is 26.54 kg/m.   Vital Signs:   Temp:  [97.6 F (36.4 C)-98.5 F (36.9 C)] 98.1 F (36.7 C) (03/01 0700) Pulse Rate:  [49-79] 66 (03/01 0859) Resp:  [17-28] 18 (03/01 0859) BP: (91-106)/(48-58) 98/55 (03/01 0700) SpO2:  [85 %-97 %] 93 % (03/01 0859) Weight:  [78 kg] 78 kg (03/01 0445) Last BM Date: 12/19/19  Weight change: Filed Weights   12/18/19 0533 12/20/19 0404 12/21/19 0445  Weight: 79.5 kg 78 kg 78 kg    Intake/Output:   Intake/Output Summary (Last 24 hours) at 12/21/2019 1129 Last data filed at 12/20/2019 2056 Gross per 24 hour  Intake --  Output 1550 ml  Net -1550 ml      Physical Exam    General:  Elderly.  No resp difficulty HEENT: Normal anicteric Neck: Supple. JVP 8-9  . Carotids 2+ bilat; + bruits. No lymphadenopathy or thyromegaly appreciated. Cor: PMI nondisplaced. Irregular rate & rhythm. 2/6 AS Lungs: Clear on 6 liters .  Abdomen: Soft, nontender, nondistended. No hepatosplenomegaly. No bruits or masses. Good bowel sounds. Extremities: No cyanosis, clubbing, rash, trace edema Neuro: alert & oriented x 3, cranial nerves grossly intact. moves all 4 extremities w/o difficulty. Affect pleasant   Telemetry  A fib 60-70s Personally reviewed  EKG    N./a   Labs    CBC Recent Labs    12/20/19 0317 12/21/19 0316  WBC 9.8 10.0  HGB 9.4* 9.3*  HCT 30.4* 30.0*  MCV 78.1* 77.7*  PLT 372 [id number]   Basic Metabolic Panel Recent Labs    12/20/19 0317 12/21/19 0316  NA 133* 135  K 3.5 4.0  CL 94* 92*  CO2 28 30  GLUCOSE 94 106*  BUN 24* 22  CREATININE 1.44* 1.23  CALCIUM 8.5* 8.6*  MG 2.0 2.0  PHOS 4.0 3.5   Liver Function Tests Recent Labs    12/20/19 0317  12/21/19 0316  ALBUMIN 2.7* 2.6*   No results for input(s): LIPASE, AMYLASE in the last 72 hours. Cardiac Enzymes No results for input(s): CKTOTAL, CKMB, CKMBINDEX, TROPONINI in the last 72 hours.  BNP: BNP (last 3 results) Recent Labs    11/22/19 1626 12/03/19 1440 12/17/19 2314  BNP 474.9* 398.6* 670.0*    ProBNP (last 3 results) No results for input(s): PROBNP in the last 8760 hours.   D-Dimer No results for input(s): DDIMER in the last 72 hours. Hemoglobin A1C No results for input(s): HGBA1C in the last 72 hours. Fasting Lipid Panel No results for input(s): CHOL, HDL, LDLCALC, TRIG, CHOLHDL, LDLDIRECT in the last 72 hours. Thyroid Function Tests No results for input(s): TSH, T4TOTAL, T3FREE, THYROIDAB in the last 72 hours.  Invalid input(s): FREET3  Other results:   Imaging    DG CHEST PORT 1 VIEW  Result Date: 12/21/2019 CLINICAL DATA:  SOB.   Hx of respiratory failure. EXAM: PORTABLE CHEST 1 VIEW COMPARISON:  Radiograph 12/18/2019 FINDINGS: Sternal wires overlie normal cardiac silhouette. Fine interstitial pattern similar to slightly increased from comparison exam. No focal infiltrate. No pneumothorax. IMPRESSION: Mild interstitial edema superimposed on chronic interstitial lung disease. Electronically Signed   By: Suzy Bouchard M.D.   On: 12/21/2019 09:43  Medications:     Scheduled Medications: . aspirin  81 mg Oral Daily  . ferrous sulfate  325 mg Oral BID WC  . fluticasone  1 spray Each Nare Daily  . furosemide  40 mg Intravenous BID  . ipratropium-albuterol  3 mL Nebulization TID  . levothyroxine  112 mcg Oral QAC breakfast  . loratadine  10 mg Oral Daily  . macitentan  10 mg Oral Daily  . midodrine  10 mg Oral TID WC  . mometasone-formoterol  2 puff Inhalation BID  . montelukast  10 mg Oral QHS  . pantoprazole  40 mg Oral BID  . potassium chloride  20 mEq Oral BID  . sildenafil  40 mg Oral TID  . simvastatin  10 mg Oral QHS  .  spironolactone  12.5 mg Oral Daily     Infusions:   PRN Medications:  acetaminophen **OR** acetaminophen, albuterol, ondansetron **OR** ondansetron (ZOFRAN) IV    Assessment/Plan   1. Severe Pulmonary Hypertension with Cor Pulmonale/ Acute on Chronic Diastolic CHF -- Most recent Echo showed LVEF of >75% with normal wall motion, mild LVH, grade 1 diastolic dysfunction, moderate AS (mean gradient 24 mmHg), and moderate TR with severely elevated pulmonary pressures. RV was noted to be moderately enlarged with severely reduced systolic function. - Volume status improved. Stop IV lasix. Start po lasix 80 daily tomorrow.  - Continue Sildenafil 40mg  three times daily and Opsumit 10mg  daily for PAH.   CAD s/p Remote CABG in 1998 - EKG shows ST depressions in precordial leads. This may be due to tachycardia.  - High-sensitivity troponin 25 >> 40. - Continue asa + statin.  -  Persistent Atrial Fibrillation - Rate controlled.  - Not on chronic anticoagulation due to history of severe diverticular bleeds requiring transfusions.   Hypotension -SBP remain in the 90s.  - On Midodrine 10 mg three times daily at home.   Moderate Aortic Stenosis - Recent Echo showed mean gradient of 22.0 mmHg and peak gradient of 36.2 mmHg. Aortic valve area by VTI 1.28cm^2.  - Can continue to monitor as outpatient.   Thoracic Aortic Aneurysm - CTA in 10/2019 showed 4.5cm ascending thoracic aortic aneurysm. Semi-annual imaging recommended at that time.  AAA - History endoluminal stent graft in 07/2008 (in [place]). - Most recent ultrsound in 09/2017 showed patent aorta and iliac stent with abnormal dilatation of the mid and distal abdominal aorta (largest measurement 2.9 cm). - Due to repeat imaging. Can be done as outpatient.   Hyperlipidemia - Continue home Simvastatin.  Hypokalemia -K stable today.   Interstitial Lung Disease/COPD/Asthma - Initially started on Doxycycline for possible COPD  exacerbation but this was felt to be less likely the etiology of acute respiratory failure and was discontinued.  - Continue breathing treatments.  - Management per prior team.  Microcytic Anemia - Hemoglobin 9.3 today. Recently baseline in 9-11 range. - Iron sats low. Received Feraheme on 2/26.   AKI - Creatinine Baseline 0.9 to 1.1 -Today creatinine 1.23   Length of Stay: 3  Amy Clegg, NP  12/21/2019, 11:29 AM  Advanced Heart Failure Team Pager [phone] (M-F; 7a - 4p)  Please contact [facility] for night-coverage after hours (4p -7a ) and weekends on [email or web address]  Patient seen and examined with Darrick Grinder, NP. We discussed all aspects of the encounter. I agree with the assessment and plan as stated above.   Volume status improved with IV lasix.Weight down 4 pounds. Breathing is better. Creatinine is improved. Will  stop IV lasix and start torsemide 40/20. May be ready for d/c soon. Continue sildenafil and macitentan for PAH. May be ready for d/c in am.   Glori Bickers, MD  3:32 PM

## 2019-12-21 NOTE — Progress Notes (Signed)
PROGRESS NOTE  Vincent Robinson [id number] DOB: August 23, 1923   PCP: Katherina Mires, MD  Patient is from: Home.  Lives with his wife.  Uses walker at baseline  DOA: 12/17/2019 LOS: 3  Brief Narrative / Interim history: 86 year old male with history of CAD/CABG, diastolic CHF, pulm HTN, COPD/chronic RF on 3 L, moderate AS, AAA s/p endovascular repair, A. fib not on anticoagulation due to GIB and hypertension presenting with increased shortness of breath and edema for 2 days, and 6 pound weight gain in about a week.  In ED, soft blood pressures.  Slightly tachycardic.  Desaturated to 88% requiring 6 L. Na 134.  K3.2.  Cr 1.3 (1.27 on 2/11).  BUN 27.  BNP 670 (higher than b/l).  HS Trop 25> 40.  Lactic acid negative.  WBC 12.2.  HTN 0.4.  CXR concerning for CHF.  COVID-19 antigen and PCR negative.  EKG with TW changes in inferior and anterior leads and RBBB.  Patient was admitted for acute on chronic respiratory failure with hypoxia due to diastolic CHF exacerbation.  Started on IV Lasix 40 mg twice daily.  Assessment & Plan: Acute on chronic respiratory failure due to right-sided CHF exacerbation with preserved ejection fraction-presented with dyspnea and lower extremity edema.  Chest x-ray concerning for CHF.  Echo on 11/23/2019 with LVEF >75%, G1 DD, severely reduced RVEF and severe pulm HTN. On 3 L at baseline at home however currently on 6 L of high flow.  ABG reassuring.-Wean oxygen as able-Encourage incentive spirometry.  Total output in last 24 hours 5501 and net - 3833 since admission.  Continue Lasix IV 40 mg twice daily.  Managed by cardiology.  Appreciate their help.  Hypotension: Still with soft blood pressures but much improved from before. -Continue home midodrine-not ideal given his heart failure. Could raise afterload -Appreciate cardiology guidance  Paroxysmal A. Fib: Rate controlled without medication.  Not on anticoagulation due to GI bleed-Continue monitoring  Chronic COPD: Doubt  exacerbation.  Recently treated for bronchitis with antibiotic.  Procalcitonin negative. -Discontinued doxycycline -Continue breathing treatments -Wean oxygen as able  History of CAD/CABG: No chest pain.  Slightly elevated troponin likely demand ischemia from CHF. Continue home aspirin and statin  Iron deficiency anemia: B/l Hgb 9-10> 10.4 (admit)> 9.9> 9.2> 4.  Iron sat 7%.  Ferritin 15.  History of GIB but denies melena or hematochezia. -IV Feraheme on 2/26.  Additional dose in 3 to 4 days -Continue p.o. iron -Check FOBT, pending. -Continue monitoring  AKI on CKD-3a/azotemia: B/l Cr ~1.1> 1.27 (on 2/11)> 1.33> 1.38> 1.51> 1.44> 1.23 -Improving.  Continue monitoring  Hypothyroidism: TSH within normal -Continue home Synthroid  AAA s/p endovascular repair -Continue started on aspirin  Hypokalemia: Resolved.   DVT prophylaxis: Subcu Lovenox Code Status: Partial code.  No CPR or intubation but NIPPV/ACL meds/DCCV Family Communication: No family present.  Patient alert and oriented and competent.  Plan of care discussed with patient.  I called patient's daughter Hilda Blades yesterday.  Updated her about plan of care and current status.  She requested palliative care consult.  Discharge barrier: Acute respiratory failure with decompensated CHF requiring high amount of oxygen Patient is from: Home Final disposition: Home with home health/24-hour supervision.  Potentially next 1 to 2 days  Consultants: Cardiology   Subjective: Patient seen and examined.  He feels better.  No new complaint.  Objective: Vitals:   12/21/19 0100 12/21/19 0445 12/21/19 0700 12/21/19 0859  BP: (!) 106/57 (!) 95/58 (!) 98/55   Pulse:  60 (!) 49 66  Resp: 18 19 20 18   Temp:  98.5 F (36.9 C) 98.1 F (36.7 C)   TempSrc:  Axillary Oral   SpO2:  97% 94% 93%  Weight:  78 kg    Height:        Intake/Output Summary (Last 24 hours) at 12/21/2019 1048 Last data filed at 12/20/2019 2056 Gross per 24 hour    Intake --  Output 1550 ml  Net -1550 ml   Filed Weights   12/18/19 0533 12/20/19 0404 12/21/19 0445  Weight: 79.5 kg 78 kg 78 kg    Examination:  General exam: Appears calm and comfortable  Respiratory system: Diminished breath sounds at bases otherwise clear. Respiratory effort normal. Cardiovascular system: S1 & S2 heard, irregularly irregular rate and rhythm. No JVD, murmurs, rubs, gallops or clicks.  +1 pitting edema bilateral lower extremity Gastrointestinal system: Abdomen is nondistended, soft and nontender. No organomegaly or masses felt. Normal bowel sounds heard. Central nervous system: Alert and oriented. No focal neurological deficits. Extremities: Symmetric 5 x 5 power. Skin: No rashes, lesions or ulcers.  Psychiatry: Judgement and insight appear normal. Mood & affect appropriate.    Procedures:  None   Microbiology summarized: COVID-19 PCR negative  Sch Meds:  Scheduled Meds: . aspirin  81 mg Oral Daily  . ferrous sulfate  325 mg Oral BID WC  . fluticasone  1 spray Each Nare Daily  . furosemide  40 mg Intravenous BID  . ipratropium-albuterol  3 mL Nebulization TID  . levothyroxine  112 mcg Oral QAC breakfast  . loratadine  10 mg Oral Daily  . macitentan  10 mg Oral Daily  . midodrine  10 mg Oral TID WC  . mometasone-formoterol  2 puff Inhalation BID  . montelukast  10 mg Oral QHS  . pantoprazole  40 mg Oral BID  . potassium chloride  20 mEq Oral BID  . sildenafil  40 mg Oral TID  . simvastatin  10 mg Oral QHS  . spironolactone  12.5 mg Oral Daily   Continuous Infusions:  PRN Meds:.acetaminophen **OR** acetaminophen, albuterol, ondansetron **OR** ondansetron (ZOFRAN) IV  Antimicrobials: Anti-infectives (From admission, onward)   Start     Dose/Rate Route Frequency Ordered Stop   12/18/19 0630  doxycycline (VIBRAMYCIN) 100 mg in sodium chloride 0.9 % 250 mL IVPB  Status:  Discontinued     100 mg 125 mL/hr over 120 Minutes Intravenous 2 times daily  12/18/19 0628 12/18/19 1042       I have personally reviewed the following labs and images: CBC: Recent Labs  Lab 12/17/19 2314 12/18/19 0531 12/19/19 0213 12/20/19 0317 12/21/19 0316  WBC 12.2* 9.7 10.0 9.8 10.0  NEUTROABS 9.4* 6.5  --   --   --   HGB 10.4* 9.9* 9.2* 9.4* 9.3*  HCT 34.8* 32.4* 29.5* 30.4* 30.0*  MCV 79.3* 78.8* 78.0* 78.1* 77.7*  PLT 409* 357 343 372 340   BMP &GFR Recent Labs  Lab 12/18/19 0531 12/18/19 1737 12/19/19 0213 12/20/19 0317 12/21/19 0316  NA 135 129* 137 133* 135  K 2.7* 3.2* 3.3* 3.5 4.0  CL 94* 93* 97* 94* 92*  CO2 28 26 30 28 30   GLUCOSE 101* 122* 105* 94 106*  BUN 25* 27* 27* 24* 22  CREATININE 1.38* 1.40* 1.51* 1.44* 1.23  CALCIUM 8.7* 8.2* 8.4* 8.5* 8.6*  MG 1.8  --  1.9 2.0 2.0  PHOS  --   --  4.3 4.0 3.5  Estimated Creatinine Clearance: 35 mL/min (by C-G formula based on SCr of 1.23 mg/dL). Liver & Pancreas: Recent Labs  Lab 12/18/19 0135 12/19/19 0213 12/20/19 0317 12/21/19 0316  AST 19  --   --   --   ALT 11  --   --   --   ALKPHOS 67  --   --   --   BILITOT 0.6  --   --   --   PROT 7.0  --   --   --   ALBUMIN 2.6* 2.6* 2.7* 2.6*   No results for input(s): LIPASE, AMYLASE in the last 168 hours. No results for input(s): AMMONIA in the last 168 hours. Diabetic: No results for input(s): HGBA1C in the last 72 hours. No results for input(s): GLUCAP in the last 168 hours. Cardiac Enzymes: No results for input(s): CKTOTAL, CKMB, CKMBINDEX, TROPONINI in the last 168 hours. No results for input(s): PROBNP in the last 8760 hours. Coagulation Profile: No results for input(s): INR, PROTIME in the last 168 hours. Thyroid Function Tests: No results for input(s): TSH, T4TOTAL, FREET4, T3FREE, THYROIDAB in the last 72 hours. Lipid Profile: No results for input(s): CHOL, HDL, LDLCALC, TRIG, CHOLHDL, LDLDIRECT in the last 72 hours. Anemia Panel: Recent Labs    12/18/19 1103  VITAMINB12 266  FOLATE 9.7  FERRITIN 15*    TIBC 353  IRON 23*  RETICCTPCT 1.9   Urine analysis: No results found for: COLORURINE, APPEARANCEUR, LABSPEC, PHURINE, GLUCOSEU, HGBUR, BILIRUBINUR, KETONESUR, PROTEINUR, UROBILINOGEN, NITRITE, LEUKOCYTESUR Sepsis Labs: Invalid input(s): PROCALCITONIN, [place]  Microbiology: Recent Results (from the past 240 hour(s))  SARS CORONAVIRUS 2 (TAT 6-24 HRS) Nasopharyngeal Nasopharyngeal Swab     Status: None   Collection Time: 12/18/19  2:37 AM   Specimen: Nasopharyngeal Swab  Result Value Ref Range Status   SARS Coronavirus 2 NEGATIVE NEGATIVE Final    Comment: (NOTE) SARS-CoV-2 target nucleic acids are NOT DETECTED. The SARS-CoV-2 RNA is generally detectable in upper and lower respiratory specimens during the acute phase of infection. Negative results do not preclude SARS-CoV-2 infection, do not rule out co-infections with other pathogens, and should not be used as the sole basis for treatment or other patient management decisions. Negative results must be combined with clinical observations, patient history, and epidemiological information. The expected result is Negative. Fact Sheet for Patients: SugarRoll.be Fact Sheet for Healthcare Providers: https://www.woods-mathews.com/ This test is not yet approved or cleared by the [place] FDA and  has been authorized for detection and/or diagnosis of SARS-CoV-2 by FDA under an Emergency Use Authorization (EUA). This EUA will remain  in effect (meaning this test can be used) for the duration of the COVID-19 declaration under Section 56 4(b)(1) of the Act, 21 U.S.C. section 360bbb-3(b)(1), unless the authorization is terminated or revoked sooner. Performed at [facility], [place] [address], [place], [place] [address]     Radiology Studies: DG CHEST PORT 1 VIEW  Result Date: 12/21/2019 CLINICAL DATA:  SOB.   Hx of respiratory failure. EXAM: PORTABLE CHEST 1 VIEW COMPARISON:   Radiograph 12/18/2019 FINDINGS: Sternal wires overlie normal cardiac silhouette. Fine interstitial pattern similar to slightly increased from comparison exam. No focal infiltrate. No pneumothorax. IMPRESSION: Mild interstitial edema superimposed on chronic interstitial lung disease. Electronically Signed   By: Suzy Bouchard M.D.   On: 12/21/2019 09:43    Total time spent: 26 minutes Darliss Cheney, MD [facility]  If 7PM-7AM, please contact night-coverage [email or web address] 12/21/2019, 10:48 AM

## 2019-12-22 LAB — CBC
HCT: 30.3 % — ABNORMAL LOW (ref 39.0–52.0)
Hemoglobin: 9.2 g/dL — ABNORMAL LOW (ref 13.0–17.0)
MCH: 24 pg — ABNORMAL LOW (ref 26.0–34.0)
MCHC: 30.4 g/dL (ref 30.0–36.0)
MCV: 78.9 fL — ABNORMAL LOW (ref 80.0–100.0)
Platelets: 328 10*3/uL (ref 150–400)
RBC: 3.84 MIL/uL — ABNORMAL LOW (ref 4.22–5.81)
RDW: 22 % — ABNORMAL HIGH (ref 11.5–15.5)
WBC: 9.7 10*3/uL (ref 4.0–10.5)
nRBC: 0.2 % (ref 0.0–0.2)

## 2019-12-22 LAB — RENAL FUNCTION PANEL
Albumin: 2.6 g/dL — ABNORMAL LOW (ref 3.5–5.0)
Anion gap: 10 (ref 5–15)
BUN: 25 mg/dL — ABNORMAL HIGH (ref 8–23)
CO2: 28 mmol/L (ref 22–32)
Calcium: 8.5 mg/dL — ABNORMAL LOW (ref 8.9–10.3)
Chloride: 96 mmol/L — ABNORMAL LOW (ref 98–111)
Creatinine, Ser: 1.3 mg/dL — ABNORMAL HIGH (ref 0.61–1.24)
GFR calc Af Amer: 54 mL/min — ABNORMAL LOW (ref >60)
GFR calc non Af Amer: 47 mL/min — ABNORMAL LOW (ref >60)
Glucose, Bld: 96 mg/dL (ref 70–99)
Phosphorus: 4.1 mg/dL (ref 2.5–4.6)
Potassium: 4.2 mmol/L (ref 3.5–5.1)
Sodium: 134 mmol/L — ABNORMAL LOW (ref 135–145)

## 2019-12-22 LAB — MAGNESIUM: Magnesium: 1.9 mg/dL (ref 1.7–2.4)

## 2019-12-22 MED ORDER — CALCIUM GLUCONATE-NACL 1-0.675 GM/50ML-% IV SOLN
1.0000 g | Freq: Once | INTRAVENOUS | Status: AC
  Administered 2019-12-22: 1000 mg via INTRAVENOUS
  Filled 2019-12-22: qty 50

## 2019-12-22 NOTE — Progress Notes (Addendum)
Advanced Heart Failure Rounding Note  PCP-Cardiologist: Quay Burow, MD   Subjective:   Yesterday IV lasix stopped.   Frustrated. Wants to go home but he remains on 6 liters [place]. Says at home O2 sats stay in the upper 80s.    Objective:   Weight Range: 78 kg Body mass index is 26.54 kg/m.   Vital Signs:   Temp:  [97.5 F (36.4 C)-98.9 F (37.2 C)] 97.5 F (36.4 C) (03/02 0807) Pulse Rate:  [55-75] 62 (03/02 0826) Resp:  [17-24] 24 (03/02 0826) BP: (84-112)/(46-58) 104/50 (03/02 0807) SpO2:  [88 %-98 %] 92 % (03/02 0826) Last BM Date: 12/19/19  Weight change: Filed Weights   12/18/19 0533 12/20/19 0404 12/21/19 0445  Weight: 79.5 kg 78 kg 78 kg    Intake/Output:   Intake/Output Summary (Last 24 hours) at 12/22/2019 1004 Last data filed at 12/22/2019 0900 Gross per 24 hour  Intake 360 ml  Output 1400 ml  Net -1040 ml      Physical Exam    General:  Elderly. No resp difficulty HEENT: normal Neck: supple. JVP 6 Carotids 2+ bilat; + bruits. No lymphadenopathy or thryomegaly appreciated. Cor: PMI nondisplaced. Irregular rate & rhythm. No rubs, gallops. 2/6 AS . Lungs: mild crackles  Abdomen: soft, nontender, nondistended. No hepatosplenomegaly. No bruits or masses. Good bowel sounds. Extremities: no cyanosis, clubbing, rash, edema Neuro: alert & oriented x 3, cranial nerves grossly intact. moves all 4 extremities w/o difficulty. Affect pleasant   Telemetry   A fib 60-70s Personally reviewed   EKG    N./a   Labs    CBC Recent Labs    12/21/19 0316 12/22/19 0502  WBC 10.0 9.7  HGB 9.3* 9.2*  HCT 30.0* 30.3*  MCV 77.7* 78.9*  PLT 340 [id number]   Basic Metabolic Panel Recent Labs    12/21/19 0316 12/22/19 0502  NA 135 134*  K 4.0 4.2  CL 92* 96*  CO2 30 28  GLUCOSE 106* 96  BUN 22 25*  CREATININE 1.23 1.30*  CALCIUM 8.6* 8.5*  MG 2.0 1.9  PHOS 3.5 4.1   Liver Function Tests Recent Labs    12/21/19 0316 12/22/19 0502  ALBUMIN 2.6*  2.6*   No results for input(s): LIPASE, AMYLASE in the last 72 hours. Cardiac Enzymes No results for input(s): CKTOTAL, CKMB, CKMBINDEX, TROPONINI in the last 72 hours.  BNP: BNP (last 3 results) Recent Labs    11/22/19 1626 12/03/19 1440 12/17/19 2314  BNP 474.9* 398.6* 670.0*    ProBNP (last 3 results) No results for input(s): PROBNP in the last 8760 hours.   D-Dimer No results for input(s): DDIMER in the last 72 hours. Hemoglobin A1C No results for input(s): HGBA1C in the last 72 hours. Fasting Lipid Panel No results for input(s): CHOL, HDL, LDLCALC, TRIG, CHOLHDL, LDLDIRECT in the last 72 hours. Thyroid Function Tests No results for input(s): TSH, T4TOTAL, T3FREE, THYROIDAB in the last 72 hours.  Invalid input(s): FREET3  Other results:   Imaging    No results found.   Medications:     Scheduled Medications: . aspirin  81 mg Oral Daily  . ferrous sulfate  325 mg Oral BID WC  . fluticasone  1 spray Each Nare Daily  . ipratropium-albuterol  3 mL Nebulization TID  . levothyroxine  112 mcg Oral QAC breakfast  . loratadine  10 mg Oral Daily  . macitentan  10 mg Oral Daily  . midodrine  10 mg Oral TID  WC  . mometasone-formoterol  2 puff Inhalation BID  . montelukast  10 mg Oral QHS  . pantoprazole  40 mg Oral BID  . potassium chloride  20 mEq Oral BID  . sildenafil  40 mg Oral TID  . simvastatin  10 mg Oral QHS  . spironolactone  12.5 mg Oral Daily  . torsemide  20 mg Oral Q1400  . torsemide  40 mg Oral Daily    Infusions: . calcium gluconate      PRN Medications: acetaminophen **OR** acetaminophen, albuterol, ondansetron **OR** ondansetron (ZOFRAN) IV    Assessment/Plan   1. Severe Pulmonary Hypertension with Cor Pulmonale/ Acute on Chronic Diastolic CHF -- Most recent Echo showed LVEF of >75% with normal wall motion, mild LVH, grade 1 diastolic dysfunction, moderate AS (mean gradient 24 mmHg), and moderate TR with severely elevated pulmonary  pressures. RV was noted to be moderately enlarged with severely reduced systolic function. - Remain on 6 liters oxygen. Having a hard time weaning. At home he is on 4 liters [place].  -Volume status stable. Starting torsemide 40 mg /20 mg.  - Continue Sildenafil 40mg  three times daily and Opsumit 10mg  daily for PAH.   CAD s/p Remote CABG in 1998 - EKG shows ST depressions in precordial leads. This may be due to tachycardia.  - High-sensitivity troponin 25 >> 40. - Continue asa + statin.  -  Persistent Atrial Fibrillation - Rate controlled - Not on chronic anticoagulation due to history of severe diverticular bleeds requiring transfusions.   Hypotension -SBP remain low .  - On Midodrine 10 mg three times daily at home.   Moderate Aortic Stenosis - Recent Echo showed mean gradient of 22.0 mmHg and peak gradient of 36.2 mmHg. Aortic valve area by VTI 1.28cm^2.  - Can continue to monitor as outpatient.   Thoracic Aortic Aneurysm - CTA in 10/2019 showed 4.5cm ascending thoracic aortic aneurysm. Semi-annual imaging recommended at that time.  AAA - History endoluminal stent graft in 07/2008 (in [place]). - Most recent ultrsound in 09/2017 showed patent aorta and iliac stent with abnormal dilatation of the mid and distal abdominal aorta (largest measurement 2.9 cm). - Due to repeat imaging. Can be done as outpatient.   Hyperlipidemia - Continue home Simvastatin.  Hypokalemia -K stable.   Interstitial Lung Disease/COPD/Asthma - Initially started on Doxycycline for possible COPD exacerbation but this was felt to be less likely the etiology of acute respiratory failure and was discontinued.  - Continue breathing treatments.  - Management per prior team.  Microcytic Anemia - Hemoglobin 9.2 today. Recently baseline in 9-11 range. - Iron sats low. Received Feraheme on 2/26.   AKI - Creatinine Baseline 0.9 to 1.1 -Today creatinine 1.3  Length of Stay: 4  Amy Clegg, NP   12/22/2019, 10:04 AM  Advanced Heart Failure Team Pager [phone] (M-F; 7a - 4p)  Please contact [facility] for night-coverage after hours (4p -7a ) and weekends on [email or web address]  Patient seen and examined with the above-signed Advanced Practice Provider and/or Housestaff. I personally reviewed laboratory data, imaging studies and relevant notes. I independently examined the patient and formulated the important aspects of the plan. I have edited the note to reflect any of my changes or salient points. I have personally discussed the plan with the patient and/or family.  Volume status looks good. SBP and sats remain a little low but not far from his baseline. Will place TED hose. We have changed lasix to torsemide. Possibly home in am from  our standpoint.   Glori Bickers, MD  2:53 PM

## 2019-12-22 NOTE — Progress Notes (Signed)
PROGRESS NOTE  Vincent Robinson [id number] DOB: [birth year]   PCP: Katherina Mires, MD  Patient is from: Home.  Lives with his wife.  Uses walker at baseline  DOA: 12/17/2019 LOS: 4  Brief Narrative / Interim history: 86 year old male with history of CAD/CABG, diastolic CHF, pulm HTN, COPD/chronic RF on 3 L, moderate AS, AAA s/p endovascular repair, A. fib not on anticoagulation due to GIB and hypertension presenting with increased shortness of breath and edema for 2 days, and 6 pound weight gain in about a week.  In ED, soft blood pressures.  Slightly tachycardic.  Desaturated to 88% requiring 6 L. Na 134.  K3.2.  Cr 1.3 (1.27 on 2/11).  BUN 27.  BNP 670 (higher than b/l).  HS Trop 25> 40.  Lactic acid negative.  WBC 12.2.  HTN 0.4.  CXR concerning for CHF.  COVID-19 antigen and PCR negative.  EKG with TW changes in inferior and anterior leads and RBBB.  Patient was admitted for acute on chronic respiratory failure with hypoxia due to diastolic CHF exacerbation.  Started on IV Lasix 40 mg twice daily.  Assessment & Plan:   Acute on chronic respiratory failure due to right-sided CHF exacerbation with preserved ejection fraction-presented with dyspnea and lower extremity edema.  Chest x-ray concerning for CHF.  Echo on 11/23/2019 with LVEF >75%, G1 DD, severely reduced RVEF and severe pulm HTN. On 3 L at baseline at home however currently on 6 L of high flow.  ABG reassuring.-Wean oxygen as able-Encourage incentive spirometry.  Total output in last 24 hours 800 and net -5973 since admission. IV Lasix switched to torsemide yesterday. Currently on 40/20 torsemide as well as Aldactone. Managed by cardiology.  Appreciate their help. Still on 6 L of nasal oxygen. At home he is on 4 L. Frustrated that he is not getting better and wants to go home.  Hypotension: Persistently low blood pressure in [id number] systolic. Per patient, at home he is usually over 123XX123 systolic while taking midodrine. -Continue home  midodrine-not ideal given his heart failure. Could raise afterload. Florinef might also cause problems for his heart failure. This makes [place] limited in treatment options. -Appreciate cardiology guidance  Paroxysmal A. Fib: Rate controlled without medication.  Not on anticoagulation due to GI bleed-Continue monitoring  Chronic COPD: Doubt exacerbation.  Recently treated for bronchitis with antibiotic.  Procalcitonin negative. -Discontinued doxycycline -Continue breathing treatments -Wean oxygen as able  History of CAD/CABG: No chest pain.  Slightly elevated troponin likely demand ischemia from CHF. Continue home aspirin and statin  Iron deficiency anemia: B/l Hgb 9-10> 10.4 (admit)> 9.9> 9.2> 9.4> 9.3> 9.2.  Iron sat 7%.  Ferritin 15.  History of GIB but denies melena or hematochezia. -IV Feraheme on 2/26.  Additional dose in 3 to 4 days -Continue p.o. iron -Check FOBT, pending. -Continue monitoring  AKI on CKD-3a/azotemia: B/l Cr ~1.1> 1.27 (on 2/11)> 1.33> 1.38> 1.51> 1.44> 1.23> 3 0 -Improving.  Continue monitoring  Hypothyroidism: TSH within normal -Continue home Synthroid  AAA s/p endovascular repair -Continue started on aspirin  Hypokalemia: Resolved.   DVT prophylaxis: Subcu Lovenox Code Status: Partial code.  No CPR or intubation but NIPPV/ACL meds/DCCV Family Communication: No family present.  Patient alert and oriented and competent.  Plan of care discussed with patient.  I called patient's daughter Hilda Blades on 12/20/2019.  Updated her about plan of care and current status.  She requested palliative care consult.  Discharge barrier: Acute respiratory failure with decompensated CHF requiring high  amount of oxygen and persistent hypotension Patient is from: Home Final disposition: Home with home health/24-hour supervision.  Potentially next 1 to 2 days  Consultants: Cardiology and palliative care   Subjective: Seen and examined. No change in breathing. Denied any shortness  of breath. Frustrated that his oxygen demand is not improving.  Objective: Vitals:   12/21/19 2306 12/22/19 0254 12/22/19 0807 12/22/19 0826  BP:   (!) 104/50   Pulse: 69 (!) 55 65 62  Resp: (!) 22 20 19  (!) 24  Temp:   (!) 97.5 F (36.4 C)   TempSrc:   Oral   SpO2: 95% 98% 93% 92%  Weight:      Height:        Intake/Output Summary (Last 24 hours) at 12/22/2019 1059 Last data filed at 12/22/2019 0900 Gross per 24 hour  Intake 360 ml  Output 1400 ml  Net -1040 ml   Filed Weights   12/18/19 0533 12/20/19 0404 12/21/19 0445  Weight: 79.5 kg 78 kg 78 kg    Examination:  General exam: Appears calm and comfortable  Respiratory system: Clear to auscultation. Respiratory effort normal. Cardiovascular system: S1 & S2 heard, RRR. No JVD, murmurs, rubs, gallops or clicks. +1 pitting edema bilateral lower extremity Gastrointestinal system: Abdomen is nondistended, soft and nontender. No organomegaly or masses felt. Normal bowel sounds heard. Central nervous system: Alert and oriented. No focal neurological deficits. Extremities: Symmetric 5 x 5 power. Skin: No rashes, lesions or ulcers.  Psychiatry: Judgement and insight appear normal. Mood & affect appropriate.    Procedures:  None   Microbiology summarized: COVID-19 PCR negative  Sch Meds:  Scheduled Meds: . aspirin  81 mg Oral Daily  . ferrous sulfate  325 mg Oral BID WC  . fluticasone  1 spray Each Nare Daily  . ipratropium-albuterol  3 mL Nebulization TID  . levothyroxine  112 mcg Oral QAC breakfast  . loratadine  10 mg Oral Daily  . macitentan  10 mg Oral Daily  . midodrine  10 mg Oral TID WC  . mometasone-formoterol  2 puff Inhalation BID  . montelukast  10 mg Oral QHS  . pantoprazole  40 mg Oral BID  . potassium chloride  20 mEq Oral BID  . sildenafil  40 mg Oral TID  . simvastatin  10 mg Oral QHS  . spironolactone  12.5 mg Oral Daily  . torsemide  20 mg Oral Q1400  . torsemide  40 mg Oral Daily   Continuous  Infusions: . calcium gluconate 1,000 mg (12/22/19 1047)   PRN Meds:.acetaminophen **OR** acetaminophen, albuterol, ondansetron **OR** ondansetron (ZOFRAN) IV  Antimicrobials: Anti-infectives (From admission, onward)   Start     Dose/Rate Route Frequency Ordered Stop   12/18/19 0630  doxycycline (VIBRAMYCIN) 100 mg in sodium chloride 0.9 % 250 mL IVPB  Status:  Discontinued     100 mg 125 mL/hr over 120 Minutes Intravenous 2 times daily 12/18/19 0628 12/18/19 1042       I have personally reviewed the following labs and images: CBC: Recent Labs  Lab 12/17/19 2314 12/17/19 2314 12/18/19 0531 12/19/19 0213 12/20/19 0317 12/21/19 0316 12/22/19 0502  WBC 12.2*   < > 9.7 10.0 9.8 10.0 9.7  NEUTROABS 9.4*  --  6.5  --   --   --   --   HGB 10.4*   < > 9.9* 9.2* 9.4* 9.3* 9.2*  HCT 34.8*   < > 32.4* 29.5* 30.4* 30.0* 30.3*  MCV 79.3*   < >  78.8* 78.0* 78.1* 77.7* 78.9*  PLT 409*   < > 357 343 372 340 328   < > = values in this interval not displayed.   BMP &GFR Recent Labs  Lab 12/18/19 0531 12/18/19 0531 12/18/19 1737 12/19/19 0213 12/20/19 0317 12/21/19 0316 12/22/19 0502  NA 135   < > 129* 137 133* 135 134*  K 2.7*   < > 3.2* 3.3* 3.5 4.0 4.2  CL 94*   < > 93* 97* 94* 92* 96*  CO2 28   < > 26 30 28 30 28   GLUCOSE 101*   < > 122* 105* 94 106* 96  BUN 25*   < > 27* 27* 24* 22 25*  CREATININE 1.38*   < > 1.40* 1.51* 1.44* 1.23 1.30*  CALCIUM 8.7*   < > 8.2* 8.4* 8.5* 8.6* 8.5*  MG 1.8  --   --  1.9 2.0 2.0 1.9  PHOS  --   --   --  4.3 4.0 3.5 4.1   < > = values in this interval not displayed.   Estimated Creatinine Clearance: 33.1 mL/min (A) (by C-G formula based on SCr of 1.3 mg/dL (H)). Liver & Pancreas: Recent Labs  Lab 12/18/19 0135 12/19/19 0213 12/20/19 0317 12/21/19 0316 12/22/19 0502  AST 19  --   --   --   --   ALT 11  --   --   --   --   ALKPHOS 67  --   --   --   --   BILITOT 0.6  --   --   --   --   PROT 7.0  --   --   --   --   ALBUMIN 2.6*  2.6* 2.7* 2.6* 2.6*   No results for input(s): LIPASE, AMYLASE in the last 168 hours. No results for input(s): AMMONIA in the last 168 hours. Diabetic: No results for input(s): HGBA1C in the last 72 hours. No results for input(s): GLUCAP in the last 168 hours. Cardiac Enzymes: No results for input(s): CKTOTAL, CKMB, CKMBINDEX, TROPONINI in the last 168 hours. No results for input(s): PROBNP in the last 8760 hours. Coagulation Profile: No results for input(s): INR, PROTIME in the last 168 hours. Thyroid Function Tests: No results for input(s): TSH, T4TOTAL, FREET4, T3FREE, THYROIDAB in the last 72 hours. Lipid Profile: No results for input(s): CHOL, HDL, LDLCALC, TRIG, CHOLHDL, LDLDIRECT in the last 72 hours. Anemia Panel: No results for input(s): VITAMINB12, FOLATE, FERRITIN, TIBC, IRON, RETICCTPCT in the last 72 hours. Urine analysis: No results found for: COLORURINE, APPEARANCEUR, LABSPEC, PHURINE, GLUCOSEU, HGBUR, BILIRUBINUR, KETONESUR, PROTEINUR, UROBILINOGEN, NITRITE, LEUKOCYTESUR Sepsis Labs: Invalid input(s): PROCALCITONIN, [place]  Microbiology: Recent Results (from the past 240 hour(s))  SARS CORONAVIRUS 2 (TAT 6-24 HRS) Nasopharyngeal Nasopharyngeal Swab     Status: None   Collection Time: 12/18/19  2:37 AM   Specimen: Nasopharyngeal Swab  Result Value Ref Range Status   SARS Coronavirus 2 NEGATIVE NEGATIVE Final    Comment: (NOTE) SARS-CoV-2 target nucleic acids are NOT DETECTED. The SARS-CoV-2 RNA is generally detectable in upper and lower respiratory specimens during the acute phase of infection. Negative results do not preclude SARS-CoV-2 infection, do not rule out co-infections with other pathogens, and should not be used as the sole basis for treatment or other patient management decisions. Negative results must be combined with clinical observations, patient history, and epidemiological information. The expected result is Negative. Fact Sheet for  Patients: SugarRoll.be Fact Sheet for  Healthcare Providers: https://www.woods-mathews.com/ This test is not yet approved or cleared by the [place] and  has been authorized for detection and/or diagnosis of SARS-CoV-2 by FDA under an Emergency Use Authorization (EUA). This EUA will remain  in effect (meaning this test can be used) for the duration of the COVID-19 declaration under Section 56 4(b)(1) of the Act, 21 U.S.C. section 360bbb-3(b)(1), unless the authorization is terminated or revoked sooner. Performed at [facility], [place] [address] [place],  [address]     Radiology Studies: No results found.  Total time spent: 27 minutes Darliss Cheney, MD [facility]  If 7PM-7AM, please contact night-coverage [email or web address] 12/22/2019, 10:59 AM

## 2019-12-22 NOTE — Progress Notes (Signed)
Palliative-   Consult received- discussed with daughter Suzi Roots. Called daughter [name] and left message to schedule meeting time with [name] and patient's spouse.   Mariana Kaufman, AGNP-C Palliative Medicine  Please call Palliative Medicine team phone with any questions [phone]. For individual providers please see AMION.  No charge

## 2019-12-22 NOTE — Progress Notes (Signed)
Physical Therapy Treatment Patient Details Name: Vincent Robinson MRN: [id number] DOB: [birth year] Today's Date: 12/22/2019    History of Present Illness 86 y.o. male with history of pulmonary hypertension, moderate aortic stenosis, CAD status post CABG, abdominal aortic aneurysm status post repair, interstitial lung disease/COPD/asthma with chronic respiratory failure, severe obstructive sleep apnea on BiPAP who presents to the ER due to worsening shortness of breath over the last 2 days.  Patient also noted increasing lower extremity edema.  Denies any chest pain. Of note pt with recent admit 1/31-2/3 for acute on chronic respiratory failure.     PT Comments    Pt eager to d/c home, but SpO2 is current limiting factor per pt report. Pt ambulated good hallway distance on 4LO2 this session, with minimum SpO2 of 85% during ambulation which recovered with standing rest break. PT educated pt on the importance of rest breaks at home, pursed lip breathing technique to recover sats (practiced throughout ambulation), and maintaining SpO2. SpO2 85-94% on 4LO2 via [place], pt hit 85% when ambulating and PT encouraged standing rest break to recover to 88%. Per pt, his baseline comfortable breathing percentage is 88%. Pt left on 4LO2 at 93% on PT exit, RN aware. PT to continue to follow acutely.    Follow Up Recommendations  Home health PT;Supervision for mobility/OOB     Equipment Recommendations  None recommended by PT    Recommendations for Other Services       Precautions / Restrictions Precautions Precautions: Fall Restrictions Weight Bearing Restrictions: No    Mobility  Bed Mobility Overal bed mobility: Needs Assistance Bed Mobility: Supine to Sit;Sit to Supine     Supine to sit: Min assist;HOB elevated Sit to supine: HOB elevated;Supervision   General bed mobility comments: min assist for supine to sit for completion of trunk elevation, scooting to EOB.  Transfers Overall transfer level:  Needs assistance Equipment used: Rolling walker (2 wheeled) Transfers: Sit to/from Stand Sit to Stand: Supervision         General transfer comment: supervision for safety, increased time to rise.  Ambulation/Gait Ambulation/Gait assistance: Min guard Gait Distance (Feet): 160 Feet Assistive device: Rolling walker (2 wheeled) Gait Pattern/deviations: Step-through pattern;Decreased stride length;Trunk flexed Gait velocity: decr   General Gait Details: Min guard for safety, verbal cuing for placement in RW and standing rest breaks with breathing technique (pursed lip, nose instead of mouth breathing).   Stairs             Wheelchair Mobility    Modified Rankin (Stroke Patients Only)       Balance Overall balance assessment: Needs assistance Sitting-balance support: Feet supported Sitting balance-Leahy Scale: Fair     Standing balance support: Bilateral upper extremity supported;No upper extremity supported Standing balance-Leahy Scale: Poor Standing balance comment: reliant on external support                            Cognition Arousal/Alertness: Awake/alert Behavior During Therapy: WFL for tasks assessed/performed Overall Cognitive Status: Within Functional Limits for tasks assessed                                 General Comments: pt pleasant and cooperative, wants to be home      Exercises      General Comments General comments (skin integrity, edema, etc.): SpO2 85-94% on 4LO2 via [place], pt hit 85% when ambulating and  PT encouraged standing rest break to recover to 88%.      Pertinent Vitals/Pain Pain Assessment: No/denies pain    Home Living                      Prior Function            PT Goals (current goals can now be found in the care plan section) Acute Rehab PT Goals Patient Stated Goal: maintain his strength and independence PT Goal Formulation: With patient Time For Goal Achievement:  01/02/20 Potential to Achieve Goals: Good Progress towards PT goals: Progressing toward goals    Frequency    Min 3X/week      PT Plan Current plan remains appropriate    Co-evaluation              AM-PAC PT "6 Clicks" Mobility   Outcome Measure  Help needed turning from your back to your side while in a flat bed without using bedrails?: A Little Help needed moving from lying on your back to sitting on the side of a flat bed without using bedrails?: A Little Help needed moving to and from a bed to a chair (including a wheelchair)?: A Little Help needed standing up from a chair using your arms (e.g., wheelchair or bedside chair)?: None Help needed to walk in hospital room?: A Little Help needed climbing 3-5 steps with a railing? : A Little 6 Click Score: 19    End of Session Equipment Utilized During Treatment: Oxygen;Gait belt Activity Tolerance: Patient tolerated treatment well Patient left: with call bell/phone within reach;in bed;with family/visitor present Nurse Communication: Mobility status PT Visit Diagnosis: Muscle weakness (generalized) (M62.81);Difficulty in walking, not elsewhere classified (R26.2)     Time: 1521-1550 PT Time Calculation (min) (ACUTE ONLY): 29 min  Charges:  $Gait Training: 8-22 mins $Self Care/Home Management: 8-22                    Marisa Cyphers, PT Acute Rehabilitation Services Pager [phone]  Office [phone]   Shailey Butterbaugh D Luvern Mcisaac 12/22/2019, 5:05 PM

## 2019-12-23 ENCOUNTER — Other Ambulatory Visit: Payer: Self-pay | Admitting: Nurse Practitioner

## 2019-12-23 DIAGNOSIS — E876 Hypokalemia: Secondary | ICD-10-CM

## 2019-12-23 DIAGNOSIS — I959 Hypotension, unspecified: Secondary | ICD-10-CM

## 2019-12-23 DIAGNOSIS — E785 Hyperlipidemia, unspecified: Secondary | ICD-10-CM

## 2019-12-23 DIAGNOSIS — E039 Hypothyroidism, unspecified: Secondary | ICD-10-CM

## 2019-12-23 DIAGNOSIS — I9589 Other hypotension: Secondary | ICD-10-CM

## 2019-12-23 LAB — CBC
HCT: 31.9 % — ABNORMAL LOW (ref 39.0–52.0)
Hemoglobin: 9.8 g/dL — ABNORMAL LOW (ref 13.0–17.0)
MCH: 24.5 pg — ABNORMAL LOW (ref 26.0–34.0)
MCHC: 30.7 g/dL (ref 30.0–36.0)
MCV: 79.8 fL — ABNORMAL LOW (ref 80.0–100.0)
Platelets: 299 10*3/uL (ref 150–400)
RBC: 4 MIL/uL — ABNORMAL LOW (ref 4.22–5.81)
RDW: 22.6 % — ABNORMAL HIGH (ref 11.5–15.5)
WBC: 10 10*3/uL (ref 4.0–10.5)
nRBC: 0 % (ref 0.0–0.2)

## 2019-12-23 LAB — RENAL FUNCTION PANEL
Albumin: 2.8 g/dL — ABNORMAL LOW (ref 3.5–5.0)
Anion gap: 12 (ref 5–15)
BUN: 27 mg/dL — ABNORMAL HIGH (ref 8–23)
CO2: 28 mmol/L (ref 22–32)
Calcium: 8.7 mg/dL — ABNORMAL LOW (ref 8.9–10.3)
Chloride: 94 mmol/L — ABNORMAL LOW (ref 98–111)
Creatinine, Ser: 1.28 mg/dL — ABNORMAL HIGH (ref 0.61–1.24)
GFR calc Af Amer: 55 mL/min — ABNORMAL LOW (ref >60)
GFR calc non Af Amer: 48 mL/min — ABNORMAL LOW (ref >60)
Glucose, Bld: 90 mg/dL (ref 70–99)
Phosphorus: 4 mg/dL (ref 2.5–4.6)
Potassium: 3.8 mmol/L (ref 3.5–5.1)
Sodium: 134 mmol/L — ABNORMAL LOW (ref 135–145)

## 2019-12-23 LAB — MAGNESIUM: Magnesium: 2.2 mg/dL (ref 1.7–2.4)

## 2019-12-23 MED ORDER — TORSEMIDE 20 MG PO TABS: 40.0000 mg | ORAL_TABLET | Freq: Every day | ORAL | 1 refills | Status: DC

## 2019-12-23 MED ORDER — MIDODRINE HCL 10 MG PO TABS: 10.0000 mg | ORAL_TABLET | Freq: Three times a day (TID) | ORAL | 1 refills | Status: AC

## 2019-12-23 MED ORDER — POTASSIUM CHLORIDE 20 MEQ/15ML (10%) PO SOLN: 20.0000 meq | Freq: Two times a day (BID) | ORAL | 1 refills | Status: DC

## 2019-12-23 MED ORDER — FERROUS SULFATE 325 (65 FE) MG PO TABS: 325.0000 mg | ORAL_TABLET | Freq: Two times a day (BID) | ORAL | 3 refills | Status: DC

## 2019-12-23 MED ORDER — SPIRONOLACTONE 25 MG PO TABS: 12.5000 mg | ORAL_TABLET | Freq: Every day | ORAL | 1 refills | Status: DC

## 2019-12-23 NOTE — Progress Notes (Signed)
Palliative-   I called patient's daughter Vincent Robinson again in an attempt to set up time to meet for [place]. Vincent Robinson was reluctant to schedule meeting time. She states that her Dad is is likely going home today or tomorrow. As patient is stable for discharge- does not appear need for urgent Palliative consult.  Discussed referral for outpatient meeting in the home and Vincent Robinson is agreeable.  Will make TOC referral to arrange outpatient Palliative for GOC discussion at home.   Mariana Kaufman, AGNP-C Palliative Medicine  Please call Palliative Medicine team phone with any questions [phone]. For individual providers please see AMION.  No charge

## 2019-12-23 NOTE — TOC Transition Note (Signed)
Transition of Care [facility]) - CM/SW Discharge Note   Patient Details  Name: Vincent Robinson MRN: [id number] Date of Birth: September 30, 1923  [facility] CM/SW Contact:  Carles Collet, RN Phone Number: 12/23/2019, 4:07 PM   Clinical Narrative:    Damaris Schooner w patient's daughter. She verifies plan for [facility] w [facility], and home palliative services. Discussed home palliative services. They would like to try [facility], they do not provide palliative but recommend [facility]. Referral made to [place] who will call wife at home number.  No other CM needs    Final next level of care: Home w Home Health Services Barriers to Discharge: No Barriers Identified   Patient Goals and CMS Choice Patient states their goals for this hospitalization and ongoing recovery are:: Pt states he is ready to get back home with his wife CMS Medicare.gov Compare Post Acute Care list provided to:: Patient Choice offered to / list presented to : Patient  Discharge Placement                       Discharge Plan and Services                          HH Arranged: RN, PT [facility] Agency: [place] Date [place]: 12/23/19 Time [place]: [id number] Representative spoke with at [place]: [place] ([place]) Interventions     Readmission Risk Interventions Readmission Risk Prevention Plan 11/24/2019  Transportation Screening Complete  PCP or Specialist Appt within 5-7 Days Patient refused  Home Care Screening Complete  Medication Review (RN CM) Referral to Pharmacy  Some recent data might be hidden

## 2019-12-23 NOTE — Progress Notes (Signed)
Encounter opened by mistake. No charge.

## 2019-12-23 NOTE — Progress Notes (Addendum)
Advanced Heart Failure Rounding Note  PCP-Cardiologist: Quay Burow, MD   Subjective:    Oxygen weaned down to 4 liters.   Denies SOB. Wants to go home.     Objective:   Weight Range: 78 kg Body mass index is 26.54 kg/m.   Vital Signs:   Temp:  [98 F (36.7 C)-98.7 F (37.1 C)] 98.1 F (36.7 C) (03/03 1241) Pulse Rate:  [46-87] 57 (03/03 1241) Resp:  [16-29] 20 (03/03 1241) BP: (83-103)/(42-62) 83/42 (03/03 1241) SpO2:  [87 %-97 %] 91 % (03/03 1241) Last BM Date: 12/19/19  Weight change: Filed Weights   12/18/19 0533 12/20/19 0404 12/21/19 0445  Weight: 79.5 kg 78 kg 78 kg    Intake/Output:   Intake/Output Summary (Last 24 hours) at 12/23/2019 1309 Last data filed at 12/23/2019 1247 Gross per 24 hour  Intake 240 ml  Output 3350 ml  Net -3110 ml      Physical Exam    General:  Elderly male  Sitting in the chair. No resp difficulty HEENT: normal anicteric Neck: supple. JVP 6-7. Carotids 2+ bilat; + bruits. No lymphadenopathy or thryomegaly appreciated. Cor: PMI nondisplaced. Irregular rate & rhythm. No rubs, gallops. 2/6 AS . Lungs: decreased in the bases on 4 liters [place].  Abdomen: soft, nontender, nondistended. No hepatosplenomegaly. No bruits or masses. Good bowel sounds. Extremities: no cyanosis, clubbing, rash, edema Neuro: alert & oriented x 3, cranial nerves grossly intact. moves all 4 extremities w/o difficulty. Affect pleasant    Telemetry   A fib 60-70s Personally reviewed   EKG    N./a   Labs    CBC Recent Labs    12/22/19 0502 12/23/19 0746  WBC 9.7 10.0  HGB 9.2* 9.8*  HCT 30.3* 31.9*  MCV 78.9* 79.8*  PLT 328 [id number]   Basic Metabolic Panel Recent Labs    12/22/19 0502 12/23/19 0746  NA 134* 134*  K 4.2 3.8  CL 96* 94*  CO2 28 28  GLUCOSE 96 90  BUN 25* 27*  CREATININE 1.30* 1.28*  CALCIUM 8.5* 8.7*  MG 1.9 2.2  PHOS 4.1 4.0   Liver Function Tests Recent Labs    12/22/19 0502 12/23/19 0746  ALBUMIN 2.6*  2.8*   No results for input(s): LIPASE, AMYLASE in the last 72 hours. Cardiac Enzymes No results for input(s): CKTOTAL, CKMB, CKMBINDEX, TROPONINI in the last 72 hours.  BNP: BNP (last 3 results) Recent Labs    11/22/19 1626 12/03/19 1440 12/17/19 2314  BNP 474.9* 398.6* 670.0*    ProBNP (last 3 results) No results for input(s): PROBNP in the last 8760 hours.   D-Dimer No results for input(s): DDIMER in the last 72 hours. Hemoglobin A1C No results for input(s): HGBA1C in the last 72 hours. Fasting Lipid Panel No results for input(s): CHOL, HDL, LDLCALC, TRIG, CHOLHDL, LDLDIRECT in the last 72 hours. Thyroid Function Tests No results for input(s): TSH, T4TOTAL, T3FREE, THYROIDAB in the last 72 hours.  Invalid input(s): FREET3  Other results:   Imaging    No results found.   Medications:     Scheduled Medications: . aspirin  81 mg Oral Daily  . ferrous sulfate  325 mg Oral BID WC  . fluticasone  1 spray Each Nare Daily  . ipratropium-albuterol  3 mL Nebulization TID  . levothyroxine  112 mcg Oral QAC breakfast  . loratadine  10 mg Oral Daily  . macitentan  10 mg Oral Daily  . midodrine  10 mg  Oral TID WC  . mometasone-formoterol  2 puff Inhalation BID  . montelukast  10 mg Oral QHS  . pantoprazole  40 mg Oral BID  . potassium chloride  20 mEq Oral BID  . sildenafil  40 mg Oral TID  . simvastatin  10 mg Oral QHS  . spironolactone  12.5 mg Oral Daily  . torsemide  20 mg Oral Q1400  . torsemide  40 mg Oral Daily    Infusions:   PRN Medications: acetaminophen **OR** acetaminophen, albuterol, ondansetron **OR** ondansetron (ZOFRAN) IV    Assessment/Plan   1. Severe Pulmonary Hypertension with Cor Pulmonale/ Acute on Chronic Diastolic CHF -- Most recent Echo showed LVEF of >75% with normal wall motion, mild LVH, grade 1 diastolic dysfunction, moderate AS (mean gradient 24 mmHg), and moderate TR with severely elevated pulmonary pressures. RV was noted  to be moderately enlarged with severely reduced systolic function. - Oxygen has been weaned down to 4 liters .  - Volume status stable. Continue torsemide 40 mg /20 mg.  - Continue Sildenafil 40mg  three times daily and Opsumit 10mg  daily for PAH.  - Renal function stable.   CAD s/p Remote CABG in 1998 - EKG shows ST depressions in precordial leads. This may be due to tachycardia.  - High-sensitivity troponin 25 >> 40. - Continue asa + statin.  -  Persistent Atrial Fibrillation -Rate controlled.  - Not on chronic anticoagulation due to history of severe diverticular bleeds requiring transfusions.   Hypotension -Ok. Continue Midodrine 10 mg three times daily at home.   Moderate Aortic Stenosis - Recent Echo showed mean gradient of 22.0 mmHg and peak gradient of 36.2 mmHg. Aortic valve area by VTI 1.28cm^2.  - Can continue to monitor as outpatient.   Thoracic Aortic Aneurysm - CTA in 10/2019 showed 4.5cm ascending thoracic aortic aneurysm. Semi-annual imaging recommended at that time.  AAA - History endoluminal stent graft in 07/2008 (in [place]). - Most recent ultrsound in 09/2017 showed patent aorta and iliac stent with abnormal dilatation of the mid and distal abdominal aorta (largest measurement 2.9 cm). - Due to repeat imaging. Can be done as outpatient.   Hyperlipidemia - Continue home Simvastatin.  Hypokalemia -K 3.8   Interstitial Lung Disease/COPD/Asthma - Initially started on Doxycycline for possible COPD exacerbation but this was felt to be less likely the etiology of acute respiratory failure and was discontinued.  - Continue breathing treatments.  - Management per prior team.  Microcytic Anemia - Hemoglobin 9.2 today. Recently baseline in 9-11 range. - Iron sats low. Received Feraheme on 2/26.   AKI - Creatinine Baseline 0.9 to 1.1 -Today creatinine 1.3    Length of Stay: [place], NP  12/23/2019, 1:09 PM  Advanced Heart Failure  Team Pager [phone] (M-F; 7a - 4p)  Please contact [facility] for night-coverage after hours (4p -7a ) and weekends on [email or web address]   Patient seen and examined with the above-signed Advanced Practice Provider and/or Housestaff. I personally reviewed laboratory data, imaging studies and relevant notes. I independently examined the patient and formulated the important aspects of the plan. I have edited the note to reflect any of my changes or salient points. I have personally discussed the plan with the patient and/or family.  He looks great today. Volume status very good. Sats back up. SBP stable in high 80s low 90s.   Ok to go home today. Would stop lasix and use torsemide 40 daily (not 40/20). D/w Dr. Grandville Silos personally.  I will see him back in [facility] next Wednesday.   Glori Bickers, MD  2:43 PM

## 2019-12-23 NOTE — Discharge Summary (Signed)
Physician Discharge Summary  Blakelee Fulco [id number] DOB: 10-04-1923 DOA: 12/17/2019  PCP: Katherina Mires, MD  Admit date: 12/17/2019 Discharge date: 12/23/2019  Time spent: 60 minutes  Recommendations for Outpatient Follow-up:  1. Follow-up in [facility] heart [facility] vascular [facility] on 12/30/2019 at 9 AM.  On follow-up patient will need a basic metabolic profile done to follow-up on electrolytes and renal function.   Discharge Diagnoses:  Principal Problem:   Acute respiratory failure with hypoxia (HCC) Active Problems:   Pulmonary hypertension, unspecified (HCC)   Aortic stenosis, moderate   Atrial fibrillation (HCC)   S/P CABG (coronary artery bypass graft)   Acute on chronic right-sided heart failure [facility])   Discharge Condition: Stable and improved  Diet recommendation: Heart healthy  Filed Weights   12/18/19 0533 12/20/19 0404 12/21/19 0445  Weight: 79.5 kg 78 kg 78 kg    History of present illness:  HPI per Dr. Annamaria Boots Snellen is a 86 y.o. male with history of pulmonary hypertension, moderate aortic stenosis, CAD status post CABG, abdominal aortic aneurysm status post endovascular repair, hyperlipidemia presents to the ER because of worsening shortness of breath over the last 2 days.  Patient also noted increasing lower extremity edema.  Denied any chest pain.  Patient stated he was treated for bronchitis 2 weeks ago and had a course of antibiotics.  Still had some greenish sputum.  Denied fever chills.  Denied chest pain.  Recently patient was having low normal blood pressure for which patient was placed on midodrine.  Patient shortness of breath is mostly exertional.  ED Course: In the ER patient has significant bilateral lower extremity edema with elevated JVD.  Chest x-ray shows congestion and chronic changes.  EKG shows A. fib initially heart rate was around 110 bpm.  Labs show WBC count 12.2 creatinine 1.32 weeks ago was 1.2.  BNP was 670 and high sensitive  troponin was 25 and 40.  Lactic acid 1.1.  Hemoglobin 10.4.  Patient blood pressure was in the low normal in the 90s initially also was in the systolic [id number].  Lasix 40 mg IV given and admitted for acute CHF.  Hospital Course:  #1 acute on chronic respiratory failure secondary to right-sided acute on chronic diastolic CHF exacerbation/severe pulmonary hypertension Patient was admitted with acute on chronic respiratory failure with increased O2 requirements.  Patient noted on presentation in the ED to be set to 88% on 6 L and noted to have borderline blood pressure.  Chest x-ray done on admission was concerning for CHF.  Patient also noted to lower extremity edema.  COVID-19 antigen PCR was negative.  EKG showed T wave changes in inferior anterior leads and right bundle branch block.  Patient started on Lasix 40 mg IV every 12 hours and cardiology consulted.  Patient did have last 2D echo on 11/23/2019 with a EF of greater than [id number], grade 1 diastolic dysfunction, severely reduced RV EF and severe pulmonary hypertension on 3 to 4 L at baseline however was requiring 6 L high flow O2.  Patient diuresed well during the hospitalization and was -9.083 L during this hospitalization.  Daily weights were ordered however not done on day of discharge.  Last weight noted in the system on 12/21/2019 was 78 kg from admission weight of 79.5 kg.  [place]dy weights were accurate.  Patient subsequently transition from IV Lasix to oral torsemide grams in the morning and 20 mg at night.  Patient also started on Aldactone 12.5 mg daily.  Patient's  siladenafil was resumed during the hospitalization as well as OPSUMIT 10 mg daily for PAH.  Patient's O2 improvement was weaned down to 4 L nasal cannula with sats ranging from 88 to 94%.  Patient improved clinically and will be discharged home in stable and improved condition on torsemide 40 mg daily as well as Aldactone 12.5 mg daily in addition to sildenafil and OPSUMIT.  Patient will follow up  closely in the heart failure clinic in 1 week.  Patient was discharged in stable and improved condition.  2.  Coronary artery disease status post remote CABG 1998 EKG done on admission showed ST depression in the precordial leads felt likely secondary to tachycardia.  High-sensitivity troponin ranged from 25-40.  Patient was followed by cardiology and maintained on cardiac regimen of aspirin, statin and diuretics.  Close outpatient follow-up.  3.  Persistent atrial fibrillation Remained rate controlled during the hospitalization.  Patient not on chronic anticoagulation due to history of severe diverticular bleeds requiring transfusion.  Outpatient follow-up.  4.  Chronic hypotension Patient's midodrine was increased to 10 mg 3 times daily.  Outpatient follow-up.  5.  Moderate aortic stenosis Patient was followed by cardiology during the hospitalization.  Outpatient follow-up.  6.  Thoracic aortic aneurysm CT angiogram January 2021 showed a 4.5 cm ascending thoracic aneurysm.  Semiannually imaging recommended.  7.  Chronic COPD/interstitial lung disease Remained stable during the hospitalization.  Patient noted to have recently been treated for bronchitis with antibiotics.  Procalcitonin was negative.  Patient initially started on doxycycline which was subsequently discontinued.  Patient maintained on breathing treatments.  8.  Acute kidney injury on chronic kidney disease stage IIIa Likely secondary to acute CHF exacerbation.  Improved with diuresis.  Outpatient follow-up.  9.  Hypothyroidism Patient maintained on home dose Synthroid.  10.  Hypokalemia Secondary to diuresis.  Repleted.  11.  Hyperlipidemia Patient maintained on statin.  12.  AAA Status post endoluminal stent graft in 07/2008 in [place].  Patient started on aspirin.  Outpatient follow-up.  Procedures: Lower extremity Dopplers 12/18/2019  Consultations:  Cardiology: Dr. Haroldine Laws 12/18/19  Discharge  Exam: Vitals:   12/23/19 1433 12/23/19 1440  BP:    Pulse:  76  Resp:  20  Temp:    SpO2: 90% 100%    General: NAD Cardiovascular: Irregularly irregular with 3/6 SEM Respiratory: Bibasilar crackles.  Otherwise clear.  No rhonchi.  Discharge Instructions   Discharge Instructions    Diet - low sodium heart healthy   Complete by: As directed    Increase activity slowly   Complete by: As directed      Allergies as of 12/23/2019   No Known Allergies     Medication List    STOP taking these medications   furosemide 40 MG tablet Commonly known as: LASIX   levofloxacin 500 MG tablet Commonly known as: LEVAQUIN     TAKE these medications   albuterol 108 (90 Base) MCG/ACT inhaler Commonly known as: VENTOLIN HFA Inhale 1 puff into the lungs 2 (two) times daily.   aspirin 81 MG chewable tablet Commonly known as: Aspirin 81 Chew 1 tablet (81 mg total) by mouth daily. Restart in 1 week following hospitalization What changed:   when to take this  additional instructions   ferrous sulfate 325 (65 FE) MG tablet Take 1 tablet (325 mg total) by mouth 2 (two) times daily with a meal.   fluticasone 27.5 MCG/SPRAY nasal spray Commonly known as: VERAMYST Place 1 spray into the nose  daily as needed for rhinitis.   Fluticasone-Salmeterol 113-14 MCG/ACT Aepb INHALE 1 PUFF TWICE A DAY What changed: when to take this   levothyroxine 112 MCG tablet Commonly known as: SYNTHROID Take 112 mcg by mouth daily before breakfast.   loratadine 10 MG tablet Commonly known as: CLARITIN Take 10 mg by mouth daily as needed for allergies.   midodrine 10 MG tablet Commonly known as: PROAMATINE Take 1 tablet (10 mg total) by mouth 3 (three) times daily with meals. What changed:   medication strength  how much to take   montelukast 10 MG tablet Commonly known as: SINGULAIR TAKE ONE TABLET BY MOUTH AT BEDTIME   Opsumit 10 MG tablet Generic drug: macitentan Take 1 tablet (10 mg  total) by mouth daily.   pantoprazole 40 MG tablet Commonly known as: PROTONIX Take 1 tablet (40 mg total) by mouth 2 (two) times daily.   potassium chloride 20 MEQ/15ML (10%) Soln Take 15 mLs (20 mEq total) by mouth 2 (two) times daily.   sildenafil 20 MG tablet Commonly known as: REVATIO Take 2 tablets (40 mg total) by mouth 3 (three) times daily.   simvastatin 10 MG tablet Commonly known as: ZOCOR Take 10 mg by mouth at bedtime.   spironolactone 25 MG tablet Commonly known as: ALDACTONE Take 0.5 tablets (12.5 mg total) by mouth daily. Start taking on: December 24, 2019   torsemide 20 MG tablet Commonly known as: DEMADEX Take 2 tablets (40 mg total) by mouth daily. Start taking on: December 24, 2019      No Known Allergies Follow-up Information    [facility] Follow up.   Why: home health Contact information: [place] [id number] [phone]        [facility] Follow up on 12/30/2019.   Specialty: Cardiology Why: at 9:00   Contact information: [id number] [place] [phone]           The results of significant diagnostics from this hospitalization (including imaging, microbiology, ancillary and laboratory) are listed below for reference.    Significant Diagnostic Studies: DG CHEST PORT 1 VIEW  Result Date: 12/21/2019 CLINICAL DATA:  SOB.   Hx of respiratory failure. EXAM: PORTABLE CHEST 1 VIEW COMPARISON:  Radiograph 12/18/2019 FINDINGS: Sternal wires overlie normal cardiac silhouette. Fine interstitial pattern similar to slightly increased from comparison exam. No focal infiltrate. No pneumothorax. IMPRESSION: Mild interstitial edema superimposed on chronic interstitial lung disease. Electronically Signed   By: Suzy Bouchard M.D.   On: 12/21/2019 09:43   DG Chest Port 1 View  Result Date: 12/18/2019 CLINICAL DATA:  Short of  breath, previous tobacco abuse EXAM: PORTABLE CHEST 1 VIEW COMPARISON:  12/17/2019 at 11:21 p.m. FINDINGS: Single frontal view of the chest demonstrates a stable cardiac silhouette. Postsurgical changes from prior CABG. Stable chronic diffuse interstitial scarring and fibrosis. No superimposed airspace disease, effusion, or pneumothorax. IMPRESSION: 1. Chronic interstitial lung disease.  No acute process. Electronically Signed   By: Randa Ngo M.D.   On: 12/18/2019 19:13   DG Chest Port 1 View  Result Date: 12/17/2019 CLINICAL DATA:  Shortness of breath EXAM: PORTABLE CHEST 1 VIEW COMPARISON:  11/22/2019, 10/18/2017 FINDINGS: Post sternotomy changes. Diffuse coarse interstitial opacity consistent with chronic disease. No acute consolidation, pleural effusion or pneumothorax. Cardiomegaly with mild central congestion. IMPRESSION: 1. Cardiomegaly with mild central congestion. 2. Diffuse interstitial opacity consistent with chronic disease. No acute  airspace disease. Electronically Signed   By: Donavan Foil M.D.   On: 12/17/2019 23:40   VAS [place] LOWER EXTREMITY VENOUS (DVT)  Result Date: 12/18/2019  Lower Venous DVTStudy Indications: Edema, and SOB.  Risk Factors: CAD, CHF, HTN, AAA. Anticoagulation: Lovenox. Comparison Study: No prior exam. Performing Technologist: Baldwin Crown ARDMS, RVT  Examination Guidelines: A complete evaluation includes B-mode imaging, spectral Doppler, color Doppler, and power Doppler as needed of all accessible portions of each vessel. Bilateral testing is considered an integral part of a complete examination. Limited examinations for reoccurring indications may be performed as noted. The reflux portion of the exam is performed with the patient in reverse Trendelenburg.  +---------+---------------+---------+-----------+----------+--------------+ RIGHT    CompressibilityPhasicitySpontaneityPropertiesThrombus Aging  +---------+---------------+---------+-----------+----------+--------------+ CFV      Full           Yes      Yes                                 +---------+---------------+---------+-----------+----------+--------------+ SFJ      Full                                                        +---------+---------------+---------+-----------+----------+--------------+ FV Prox  Full                                                        +---------+---------------+---------+-----------+----------+--------------+ FV Mid   Full                                                        +---------+---------------+---------+-----------+----------+--------------+ FV DistalFull                                                        +---------+---------------+---------+-----------+----------+--------------+ PFV      Full                                                        +---------+---------------+---------+-----------+----------+--------------+ POP      Full           Yes      Yes                                 +---------+---------------+---------+-----------+----------+--------------+ PTV      Full                                                        +---------+---------------+---------+-----------+----------+--------------+  PERO     Full                                                        +---------+---------------+---------+-----------+----------+--------------+   +---------+---------------+---------+-----------+----------+--------------+ LEFT     CompressibilityPhasicitySpontaneityPropertiesThrombus Aging +---------+---------------+---------+-----------+----------+--------------+ CFV      Full           Yes      Yes                                 +---------+---------------+---------+-----------+----------+--------------+ SFJ      Full                                                         +---------+---------------+---------+-----------+----------+--------------+ FV Prox  Full                                                        +---------+---------------+---------+-----------+----------+--------------+ FV Mid   Full                                                        +---------+---------------+---------+-----------+----------+--------------+ FV DistalFull                                                        +---------+---------------+---------+-----------+----------+--------------+ PFV      Full                                                        +---------+---------------+---------+-----------+----------+--------------+ POP      Full           Yes      Yes                                 +---------+---------------+---------+-----------+----------+--------------+ PTV      Full                                                        +---------+---------------+---------+-----------+----------+--------------+ PERO     Full                                                        +---------+---------------+---------+-----------+----------+--------------+  Summary: BILATERAL: - No evidence of deep vein thrombosis seen in the lower extremities, bilaterally.  RIGHT: - No cystic structure found in the popliteal fossa.  LEFT: - No cystic structure found in the popliteal fossa.  *See table(s) above for measurements and observations. Electronically signed by Servando Snare MD on 12/18/2019 at 3:23:03 PM.    Final     Microbiology: Recent Results (from the past 240 hour(s))  SARS CORONAVIRUS 2 (TAT 6-24 HRS) Nasopharyngeal Nasopharyngeal Swab     Status: None   Collection Time: 12/18/19  2:37 AM   Specimen: Nasopharyngeal Swab  Result Value Ref Range Status   SARS Coronavirus 2 NEGATIVE NEGATIVE Final    Comment: (NOTE) SARS-CoV-2 target nucleic acids are NOT DETECTED. The SARS-CoV-2 RNA is generally detectable in upper and lower respiratory  specimens during the acute phase of infection. Negative results do not preclude SARS-CoV-2 infection, do not rule out co-infections with other pathogens, and should not be used as the sole basis for treatment or other patient management decisions. Negative results must be combined with clinical observations, patient history, and epidemiological information. The expected result is Negative. Fact Sheet for Patients: SugarRoll.be Fact Sheet for Healthcare Providers: https://www.woods-mathews.com/ This test is not yet approved or cleared by the [place] FDA and  has been authorized for detection and/or diagnosis of SARS-CoV-2 by FDA under an Emergency Use Authorization (EUA). This EUA will remain  in effect (meaning this test can be used) for the duration of the COVID-19 declaration under Section 56 4(b)(1) of the Act, 21 U.S.C. section 360bbb-3(b)(1), unless the authorization is terminated or revoked sooner. Performed at [facility], [place] [address], [place], [place] [address]      Labs: Basic Metabolic Panel: Recent Labs  Lab 12/19/19 [id number] 12/20/19 0317 12/21/19 0316 12/22/19 0502 12/23/19 0746  NA 137 133* 135 134* 134*  K 3.3* 3.5 4.0 4.2 3.8  CL 97* 94* 92* 96* 94*  CO2 30 28 30 28 28   GLUCOSE 105* 94 106* 96 90  BUN 27* 24* 22 25* 27*  CREATININE 1.51* 1.44* 1.23 1.30* 1.28*  CALCIUM 8.4* 8.5* 8.6* 8.5* 8.7*  MG 1.9 2.0 2.0 1.9 2.2  PHOS 4.3 4.0 3.5 4.1 4.0   Liver Function Tests: Recent Labs  Lab 12/18/19 0135 12/18/19 0135 12/19/19 0213 12/20/19 0317 12/21/19 0316 12/22/19 0502 12/23/19 0746  AST 19  --   --   --   --   --   --   ALT 11  --   --   --   --   --   --   ALKPHOS 67  --   --   --   --   --   --   BILITOT 0.6  --   --   --   --   --   --   PROT 7.0  --   --   --   --   --   --   ALBUMIN 2.6*   < > 2.6* 2.7* 2.6* 2.6* 2.8*   < > = values in this interval not displayed.   No results for  input(s): LIPASE, AMYLASE in the last 168 hours. No results for input(s): AMMONIA in the last 168 hours. CBC: Recent Labs  Lab 12/17/19 2314 12/17/19 2314 12/18/19 0531 12/18/19 0531 12/19/19 0213 12/20/19 0317 12/21/19 0316 12/22/19 0502 12/23/19 0746  WBC 12.2*   < > 9.7   < > 10.0 9.8 10.0 9.7 10.0  NEUTROABS  9.4*  --  6.5  --   --   --   --   --   --   HGB 10.4*   < > 9.9*   < > 9.2* 9.4* 9.3* 9.2* 9.8*  HCT 34.8*   < > 32.4*   < > 29.5* 30.4* 30.0* 30.3* 31.9*  MCV 79.3*   < > 78.8*   < > 78.0* 78.1* 77.7* 78.9* 79.8*  PLT 409*   < > 357   < > 343 372 340 328 299   < > = values in this interval not displayed.   Cardiac Enzymes: No results for input(s): CKTOTAL, CKMB, CKMBINDEX, TROPONINI in the last 168 hours. BNP: BNP (last 3 results) Recent Labs    11/22/19 1626 12/03/19 1440 12/17/19 2314  BNP 474.9* 398.6* 670.0*    ProBNP (last 3 results) No results for input(s): PROBNP in the last 8760 hours.  CBG: No results for input(s): GLUCAP in the last 168 hours.     Signed:  Irine Seal MD.  [facility] 12/23/2019, 3:10 PM

## 2019-12-23 NOTE — Progress Notes (Signed)
Occupational Therapy Treatment Patient Details Name: Vincent Robinson MRN: [id number] DOB: Jul 28, 1923 Today's Date: 12/23/2019    History of present illness 86 y.o. male with history of pulmonary hypertension, moderate aortic stenosis, CAD status post CABG, abdominal aortic aneurysm status post repair, interstitial lung disease/COPD/asthma with chronic respiratory failure, severe obstructive sleep apnea on BiPAP who presents to the ER due to worsening shortness of breath over the last 2 days.  Patient also noted increasing lower extremity edema.  Denies any chest pain. Of note pt with recent admit 1/31-2/3 for acute on chronic respiratory failure.    OT comments  Patient supine in bed on arrival.  He was on 3L O2 throughout session, with SpO2 between 84-90.  He demonstrated good initiation of pursed lip breathing.  Completed bed mobility with min guard and increased time.  Able to walk to bathroom with min guard and complete toileting with supervision.  Also completed standing grooming at sink.  Will continue to follow with OT acutely to address the deficits listed below.    Follow Up Recommendations  Home health OT;Supervision/Assistance - 24 hour    Equipment Recommendations  Tub/shower seat    Recommendations for Other Services      Precautions / Restrictions Precautions Precautions: Fall Precaution Comments: monitor vitals Restrictions Weight Bearing Restrictions: No       Mobility Bed Mobility Overal bed mobility: Needs Assistance Bed Mobility: Supine to Sit     Supine to sit: HOB elevated;Min guard        Transfers Overall transfer level: Needs assistance Equipment used: None Transfers: Sit to/from Stand Sit to Stand: Supervision              Balance Overall balance assessment: Needs assistance Sitting-balance support: Feet supported Sitting balance-Leahy Scale: Fair     Standing balance support: No upper extremity supported;During functional activity Standing  balance-Leahy Scale: Fair                             ADL either performed or assessed with clinical judgement   ADL Overall ADL's : Needs assistance/impaired     Grooming: Wash/dry face;Wash/dry hands;Set up;Supervision/safety;Standing                   Toilet Transfer: Min guard;Ambulation;Regular Toilet   Toileting- Water quality scientist and Hygiene: Supervision/safety;Sit to/from stand       Functional mobility during ADLs: Min guard       Vision       Perception     Praxis      Cognition Arousal/Alertness: Awake/alert Behavior During Therapy: WFL for tasks assessed/performed Overall Cognitive Status: Within Functional Limits for tasks assessed                                 General Comments: pt pleasant and cooperative, wants to be home        Exercises     Shoulder Instructions       General Comments SpO2 84-90 on 3L O2 [place].      Pertinent Vitals/ Pain       Pain Assessment: No/denies pain  Home Living                                          Prior Functioning/Environment  Frequency  Min 2X/week        Progress Toward Goals  OT Goals(current goals can now be found in the care plan section)  Progress towards OT goals: Progressing toward goals  Acute Rehab OT Goals Patient Stated Goal: maintain his strength and independence OT Goal Formulation: With patient Time For Goal Achievement: 01/02/20 Potential to Achieve Goals: Good  Plan Discharge plan remains appropriate    Co-evaluation                 AM-PAC OT "6 Clicks" Daily Activity     Outcome Measure   Help from another person eating meals?: None Help from another person taking care of personal grooming?: A Little Help from another person toileting, which includes using toliet, bedpan, or urinal?: A Little Help from another person bathing (including washing, rinsing, drying)?: A Little Help from another  person to put on and taking off regular upper body clothing?: None Help from another person to put on and taking off regular lower body clothing?: A Little 6 Click Score: 20    End of Session Equipment Utilized During Treatment: Oxygen  OT Visit Diagnosis: Muscle weakness (generalized) (M62.81);Unsteadiness on feet (R26.81)   Activity Tolerance Patient tolerated treatment well   Patient Left in chair;with call bell/phone within reach   Nurse Communication Mobility status        Time: 1110-1146 OT Time Calculation (min): 36 min  Charges: OT General Charges $OT Visit: 1 Visit OT Treatments $Self Care/Home Management : 23-37 mins  August Luz, OTR/L    Phylliss Bob 12/23/2019, 1:08 PM

## 2019-12-25 ENCOUNTER — Telehealth: Payer: Self-pay | Admitting: Internal Medicine

## 2019-12-25 NOTE — Telephone Encounter (Signed)
[facility] Palliative visit scheduled for 01-08-20 at 3:00.

## 2019-12-29 ENCOUNTER — Telehealth (HOSPITAL_COMMUNITY): Payer: Self-pay

## 2019-12-29 NOTE — Telephone Encounter (Signed)

## 2019-12-30 ENCOUNTER — Ambulatory Visit (HOSPITAL_COMMUNITY)
Admit: 2019-12-30 | Discharge: 2019-12-30 | Disposition: A | Discharge status: Home / Self Care | Payer: Medicare Other | Source: Ambulatory Visit | Attending: Internal Medicine | Admitting: Internal Medicine

## 2019-12-30 ENCOUNTER — Encounter (HOSPITAL_COMMUNITY): Payer: Self-pay | Admitting: Internal Medicine

## 2019-12-30 ENCOUNTER — Other Ambulatory Visit: Payer: Self-pay

## 2019-12-30 VITALS — BP 110/50 | HR 78 | Wt 169.4 lb

## 2019-12-30 DIAGNOSIS — I714 Abdominal aortic aneurysm, without rupture: Secondary | ICD-10-CM | POA: Insufficient documentation

## 2019-12-30 DIAGNOSIS — I50812 Chronic right heart failure: Secondary | ICD-10-CM

## 2019-12-30 DIAGNOSIS — Z79899 Other long term (current) drug therapy: Secondary | ICD-10-CM | POA: Insufficient documentation

## 2019-12-30 DIAGNOSIS — J9610 Chronic respiratory failure, unspecified whether with hypoxia or hypercapnia: Secondary | ICD-10-CM | POA: Diagnosis not present

## 2019-12-30 DIAGNOSIS — E039 Hypothyroidism, unspecified: Secondary | ICD-10-CM | POA: Diagnosis not present

## 2019-12-30 DIAGNOSIS — I50810 Right heart failure, unspecified: Secondary | ICD-10-CM | POA: Diagnosis not present

## 2019-12-30 DIAGNOSIS — Z87891 Personal history of nicotine dependence: Secondary | ICD-10-CM | POA: Diagnosis not present

## 2019-12-30 DIAGNOSIS — I272 Pulmonary hypertension, unspecified: Secondary | ICD-10-CM | POA: Diagnosis not present

## 2019-12-30 DIAGNOSIS — I2510 Atherosclerotic heart disease of native coronary artery without angina pectoris: Secondary | ICD-10-CM | POA: Insufficient documentation

## 2019-12-30 DIAGNOSIS — I350 Nonrheumatic aortic (valve) stenosis: Secondary | ICD-10-CM

## 2019-12-30 DIAGNOSIS — I2729 Other secondary pulmonary hypertension: Secondary | ICD-10-CM | POA: Insufficient documentation

## 2019-12-30 DIAGNOSIS — Z7982 Long term (current) use of aspirin: Secondary | ICD-10-CM | POA: Insufficient documentation

## 2019-12-30 DIAGNOSIS — G4733 Obstructive sleep apnea (adult) (pediatric): Secondary | ICD-10-CM | POA: Insufficient documentation

## 2019-12-30 DIAGNOSIS — I25119 Atherosclerotic heart disease of native coronary artery with unspecified angina pectoris: Secondary | ICD-10-CM

## 2019-12-30 DIAGNOSIS — I4819 Other persistent atrial fibrillation: Secondary | ICD-10-CM | POA: Insufficient documentation

## 2019-12-30 DIAGNOSIS — I5032 Chronic diastolic (congestive) heart failure: Secondary | ICD-10-CM

## 2019-12-30 DIAGNOSIS — J449 Chronic obstructive pulmonary disease, unspecified: Secondary | ICD-10-CM | POA: Insufficient documentation

## 2019-12-30 DIAGNOSIS — Z951 Presence of aortocoronary bypass graft: Secondary | ICD-10-CM | POA: Insufficient documentation

## 2019-12-30 DIAGNOSIS — E785 Hyperlipidemia, unspecified: Secondary | ICD-10-CM | POA: Diagnosis not present

## 2019-12-30 DIAGNOSIS — I4510 Unspecified right bundle-branch block: Secondary | ICD-10-CM | POA: Diagnosis not present

## 2019-12-30 DIAGNOSIS — I480 Paroxysmal atrial fibrillation: Secondary | ICD-10-CM

## 2019-12-30 LAB — BASIC METABOLIC PANEL
Anion gap: 11 (ref 5–15)
BUN: 17 mg/dL (ref 8–23)
CO2: 26 mmol/L (ref 22–32)
Calcium: 8.9 mg/dL (ref 8.9–10.3)
Chloride: 99 mmol/L (ref 98–111)
Creatinine, Ser: 1.23 mg/dL (ref 0.61–1.24)
GFR calc Af Amer: 58 mL/min — ABNORMAL LOW (ref >60)
GFR calc non Af Amer: 50 mL/min — ABNORMAL LOW (ref >60)
Glucose, Bld: 98 mg/dL (ref 70–99)
Potassium: 4.1 mmol/L (ref 3.5–5.1)
Sodium: 136 mmol/L (ref 135–145)

## 2019-12-30 LAB — CBC
HCT: 37.6 % — ABNORMAL LOW (ref 39.0–52.0)
Hemoglobin: 11.5 g/dL — ABNORMAL LOW (ref 13.0–17.0)
MCH: 25.4 pg — ABNORMAL LOW (ref 26.0–34.0)
MCHC: 30.6 g/dL (ref 30.0–36.0)
MCV: 83 fL (ref 80.0–100.0)
Platelets: 343 10*3/uL (ref 150–400)
RBC: 4.53 MIL/uL (ref 4.22–5.81)
RDW: 26.2 % — ABNORMAL HIGH (ref 11.5–15.5)
WBC: 10.9 10*3/uL — ABNORMAL HIGH (ref 4.0–10.5)
nRBC: 0 % (ref 0.0–0.2)

## 2019-12-30 LAB — BRAIN NATRIURETIC PEPTIDE: B Natriuretic Peptide: 140.2 pg/mL — ABNORMAL HIGH (ref 0.0–100.0)

## 2019-12-30 NOTE — Progress Notes (Signed)
ADVANCED HF CLINIC NOTE  Referring Physician: Dr. Gwenlyn Found  Primary Cardiologist: Dr. Gwenlyn Found   HPI:  Mr. Arslan is 86 year old with past history of COPD, hypersomnia with sleep apnea, allergic rhinitis, PAF, cough variant asthma. He recently moved from [place] to be close to his family and wants to establish care here. He is chief complaints of dyspnea on exertion. He has history of sleep apnea on BiPAP at settings of 20/6. This is report of CT of the chest from [place] which shows calcified nodule, chronic scarring. 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 He moved to [place] in 1956. Apparently his immigration was delayed due to a lung abnormality on imaging. He was eventually cleared. He does not have any history of TB, exposure to TB. He's had an episode of influenza while in [place].  Hospitalized in July, 2018 for a spontaneous pneumothorax that was treated conservatively. He was again readmitted in August 2018 for lower GI bleed (diverticular). During this admission he was noted to be in acute hypoxic respiratory failure secondary to diastolic heart failure. He improved with diuresis.  He had coronary artery bypass grafting in 1998. He had an endoluminal stent graft for abdominal aortic aneurysm in [place] 10/09 . Pre-op cath showed patent grafts.  Doppler performed 2/18 revealed aortic dimensions of approximately 4.4 x 4.6 cm.   Has been followed by Dr. Vaughan Browner. Smoked a little bit in remote past but quit a long time ago. No h/o connective tissue disease. Wears BIPAP for severe OSA. Has been on home O2 since 2017. Dr. Vaughan Browner has arranged for BIPAP titration and repeat hi-res CT. BiPAP download December 2017-January 2018. AHI 7.2  6MW 02/2018: 820 feet, O2 sats 89-92% on 2L, HR 98-123  Echo 11/19: EF 60-65% RV mild to moderately reduced. RVSP 46mmHG Mild AS   Hospitalized in 2/21 for volume overload and hypoxia. Diuresed well. Switched to torsemide. Started on midodrine for low BP.,   He returns today for f/u. Feeling much better. Weight stable at 166 (was 168 when he got home from the hospital). Denies dizziness, orthopnea or PND. Following BP, weight and pulse ox closely. SBP 95-110. Sats low 90s on 2L at rest 3L with exertion.   Studies:  RHC 12/11/17 RA = 6 RV = 72/7 PA = 73/27 (44) PCW = 10 Fick cardiac output/index = 4.1/2.1 PVR = 8.4 WU Ao sat = 91% PA sat = 58%, 58%  Echo 11/18 EF 60-65% grade I DD  RV severely dilated moderate TR RVSP 40mmHG   VQ 12/18 Normal perfusion lung scan.  Irregular peripheral ventilation in both lungs consistent with chronic parenchymal lung disease changes.  PFTs 02/28/16 FVC 3.37 (116%), FEV1 2.08 (108%), F/F 62 Mild obstructive airway disease, no broncho-dilator response  CT scan 07/15/15- 2 .3 x 1.7 cm right upper lobe calcified opacity likely to represent pleural , parenchymal scarring. Mild centrilobular, paraseptal emphysema. Subpleural reticulation, fibrosis representing mild chronic ILD High-resolution CT 09/04/16 - stable right upper lobe scarring, hyper aerated lungs. Minimal interstitial lung disease and peripheral unchanged compared to 2016.  Hi-res CT: 01/06/18: Stable ILD   Review of systems complete and found to be negative unless listed in HPI.   Past Medical History:  Diagnosis Date  . AAA (abdominal aortic aneurysm) ([place])   . Acute respiratory failure ([place]) 11/23/2019  . Asthma   .  Basal cell carcinoma   . Chronic respiratory failure ([place])   . COPD (chronic obstructive pulmonary disease) ([place])   . Coronary artery disease   . History of chronic respiratory failure   . Hyperlipidemia   . Hypothyroidism   . Right heart failure ([place])   . Skin cancer 2017  . Sleep apnea     Current Outpatient Medications  Medication Sig Dispense Refill  . albuterol (PROVENTIL  HFA;VENTOLIN HFA) 108 (90 Base) MCG/ACT inhaler Inhale 1 puff into the lungs 2 (two) times daily. 1 Inhaler 2  . aspirin 81 MG chewable tablet Chew 81 mg by mouth daily.    . ferrous sulfate 325 (65 FE) MG tablet Take 1 tablet (325 mg total) by mouth 2 (two) times daily with a meal.  3  . fluticasone (VERAMYST) 27.5 MCG/SPRAY nasal spray Place 1 spray into the nose daily as needed for rhinitis.     . Fluticasone-Salmeterol,sensor, 113-14 MCG/ACT AEPB Inhale 1 puff into the lungs 2 (two) times daily.    Marland Kitchen levothyroxine (SYNTHROID, LEVOTHROID) 112 MCG tablet Take 112 mcg by mouth daily before breakfast.    . loratadine (CLARITIN) 10 MG tablet Take 10 mg by mouth daily as needed for allergies.     . macitentan (OPSUMIT) 10 MG tablet Take 1 tablet (10 mg total) by mouth daily. 30 tablet 0  . midodrine (PROAMATINE) 10 MG tablet Take 1 tablet (10 mg total) by mouth 3 (three) times daily with meals. 90 tablet 1  . montelukast (SINGULAIR) 10 MG tablet Take 10 mg by mouth at bedtime.    . pantoprazole (PROTONIX) 40 MG tablet Take 1 tablet (40 mg total) by mouth 2 (two) times daily. 60 tablet 0  . potassium chloride 20 MEQ/15ML (10%) SOLN Take 15 mLs (20 mEq total) by mouth 2 (two) times daily. 900 mL 1  . sildenafil (REVATIO) 20 MG tablet Take 2 tablets (40 mg total) by mouth 3 (three) times daily. 540 tablet 3  . simvastatin (ZOCOR) 10 MG tablet Take 10 mg by mouth at bedtime.    Marland Kitchen spironolactone (ALDACTONE) 25 MG tablet Take 0.5 tablets (12.5 mg total) by mouth daily. 30 tablet 1  . torsemide (DEMADEX) 20 MG tablet Take 2 tablets (40 mg total) by mouth daily. 60 tablet 1   No current facility-administered medications for this encounter.    No Known Allergies    Social History   Socioeconomic History  . Marital status: Married    Spouse name: Not on file  . Number of children: Not on file  . Years of education: Not on file  . Highest education level: Not on file  Occupational History  . Not  on file  Tobacco Use  . Smoking status: Former Smoker    Packs/day: 0.50    Quit date: 08/06/1961    Years since quitting: 58.4  . Smokeless tobacco: Never Used  Substance and Sexual Activity  . Alcohol use: Not Currently  . Drug use: Never  . Sexual activity: Not on file  Other Topics Concern  . Not on file  Social History Narrative  . Not on file   Social Determinants of Health   Financial Resource Strain:   . Difficulty of Paying Living Expenses: Not on file  Food Insecurity:   . Worried About Charity fundraiser in the Last Year: Not on file  . Ran Out of Food in the Last Year: Not on file  Transportation Needs:   . Lack of  Transportation (Medical): Not on file  . Lack of Transportation (Non-Medical): Not on file  Physical Activity:   . Days of Exercise per Week: Not on file  . Minutes of Exercise per Session: Not on file  Stress:   . Feeling of Stress : Not on file  Social Connections:   . Frequency of Communication with Friends and Family: Not on file  . Frequency of Social Gatherings with Friends and Family: Not on file  . Attends Religious Services: Not on file  . Active Member of Clubs or Organizations: Not on file  . Attends Archivist Meetings: Not on file  . Marital Status: Not on file  Intimate Partner Violence:   . Fear of Current or Ex-Partner: Not on file  . Emotionally Abused: Not on file  . Physically Abused: Not on file  . Sexually Abused: Not on file      Family History  Problem Relation Age of Onset  . Emphysema Mother   . Stroke Father   . Movement disorder Sister   . Aneurysm Brother        Thoracic aorta  . Macular degeneration Brother     Vitals:   12/30/19 0925  BP: (!) 110/50  Pulse: 78  SpO2: 94%  Weight: 76.8 kg (169 lb 6.4 oz)   Wt Readings from Last 3 Encounters:  12/30/19 76.8 kg (169 lb 6.4 oz)  12/21/19 78 kg (171 lb 15.3 oz)  12/10/19 79.8 kg (176 lb)    PHYSICAL EXAM: General:  Elderly No resp  difficulty on O2 HEENT: normal Neck: supple. JVP 6 Carotids 2+ bilat; + bruits. No lymphadenopathy or thryomegaly appreciated. Cor: PMI nondisplaced. Regular rate & rhythm. 3/6 AS. Lungs: clear decreased BS Abdomen: soft, nontender, nondistended. No hepatosplenomegaly. No bruits or masses. Good bowel sounds. Extremities: no cyanosis, clubbing, rash, tr edema Neuro: alert & orientedx3, cranial nerves grossly intact. moves all 4 extremities w/o difficulty. Affect pleasant  Sinus 68 RBBB Personally reviewed  ASSESSMENT & PLAN:  1. Pulmonary hypertension with cor pulmonale - Echo, CT, PFTs and VQ studies reviewed personally - He has evidence of significant pulmonary HTN with severe RV failure. Etiology remains unclear.  - CT with evidence of ILD but PFTs not that bad. VQ negative.  - Suspect PH multifactorial but PAH out of proportion to LV pressures and ILD so suspect he must have component of Group I disease - RHC 2/19 c/w moderate PAH - Doing well with sildenafil without evidence of shunting. Continue 40 mg tid  And macitentan 10 daily - Volume status much improved on torsemide 40 daily - Check labs today - Have previously  suggested referral to Pulmonary rehab but he refuses - Continue compression stockings   2. Chronic respiratory failure - evidence of mild ILD on CT. PFTs not too bad - has severe OSA on Bipap. Continue BiPAP qHS - continue supplemental O2. sats look good today - Follows with Pulmonary   3. CAD s/p CABG - No s/s ischemia Myoview 1/19 normal.  - Managed by Dr. Gwenlyn Found. Ok to cut ASA back to every other day due to severe bruising  4. Aortic stenosis - Moderate by echo 2/21 mean gradient 22. Will follow   5. Atrial fibrillation, persistent - In NSr today Not candidate for [facility] due to recurrent LGIB   Glori Bickers, MD  10:14 AM

## 2019-12-30 NOTE — Patient Instructions (Signed)
Labs done today, we will notify you for abnormal results  Your physician recommends that you schedule a follow-up appointment in: 2 months  If you have any questions or concerns before your next appointment please send us a message through mychart or call our office at [phone].  At the [facility], you and your health needs are our priority. As part of our continuing mission to provide you with exceptional heart care, we have created designated Provider Care Teams. These Care Teams include your primary Cardiologist (physician) and Advanced Practice Providers (APPs- Physician Assistants and Nurse Practitioners) who all work together to provide you with the care you need, when you need it.   You may see any of the following providers on your designated Care Team at your next follow up: . Dr Daniel Bensimhon . Dr Dalton McLean . Amy Clegg, NP . Brittainy Simmons, PA . Lauren Kemp, PharmD   Please be sure to bring in all your medications bottles to every appointment.     

## 2020-01-08 ENCOUNTER — Other Ambulatory Visit: Payer: Medicare Other | Admitting: Internal Medicine

## 2020-01-08 ENCOUNTER — Other Ambulatory Visit: Payer: Self-pay

## 2020-01-08 DIAGNOSIS — Z515 Encounter for palliative care: Secondary | ICD-10-CM

## 2020-01-28 ENCOUNTER — Other Ambulatory Visit (HOSPITAL_COMMUNITY): Payer: Self-pay | Admitting: *Deleted

## 2020-01-28 MED ORDER — PANTOPRAZOLE SODIUM 40 MG PO TBEC: 40.0000 mg | DELAYED_RELEASE_TABLET | Freq: Two times a day (BID) | ORAL | 3 refills | Status: DC

## 2020-01-28 MED ORDER — FERROUS SULFATE 325 (65 FE) MG PO TABS: 325.0000 mg | ORAL_TABLET | Freq: Two times a day (BID) | ORAL | 3 refills | Status: DC

## 2020-02-17 ENCOUNTER — Other Ambulatory Visit: Payer: Medicare Other | Admitting: Internal Medicine

## 2020-02-19 NOTE — Progress Notes (Signed)
[facility] Consult Note Telephone: [phone]  Fax: [phone]  PATIENT NAME: Vincent Robinson DOB: 1923-01-03 MRN: [id number]  PRIMARY CARE PROVIDER:   Katherina Mires, MD  REFERRING PROVIDER:  Katherina Mires, MD [address] [place],  [place] [address]  RESPONSIBLE PARTY:   self      RECOMMENDATIONS and PLAN:  Palliative care encounter  Z51.5  1.  Advance care planning:  Reviewed palliative and hospice care.  Long term goals are to decrease shortness of breath, improve stamina and prevent re-hospitalizations.   Advance directives were reviewed.  Patient and wife would like to have opportunity to discuss MOST form and re-discuss on next visit. Palliative care will f/u with patient in aprox. 4 weeks.   2.  Shortness of breath:  Multifactorial  Exertional shortness of breath.  Gradually increase ambulation distance and exercise.  Use supplemental oxygen as prescribed with exertion.   3.  Edema:  Related to hear failure and sedentary lifestyle.  Compression socks and leg elevation.  Monitor sodium use.   I spent 60 minutes providing this consultation,  from 1500 to 1600. More than 50% of the time in this consultation was spent coordinating communication with patient, wife and daughter.  HISTORY OF PRESENT ILLNESS:  Vincent Robinson is a 86 y.o. year old male with multiple medical problems including pulmonary HTN, heart failure, afib and CAD.  He was hospitalized from 2/25-12/23/19 along with early 11/2019 due to acute respiratory failure.   Palliative Care was asked to help address goals of care.   CODE STATUS: Full Code  PPS: 50% HOSPICE ELIGIBILITY/DIAGNOSIS: TBD  PAST MEDICAL HISTORY:  Past Medical History:  Diagnosis Date  . AAA (abdominal aortic aneurysm) ([place])   . Acute respiratory failure ([place]) 11/23/2019  . Asthma   . Basal cell carcinoma   . Chronic respiratory failure ([place])   . COPD (chronic obstructive  pulmonary disease) ([place])   . Coronary artery disease   . History of chronic respiratory failure   . Hyperlipidemia   . Hypothyroidism   . Right heart failure ([place])   . Skin cancer 2017  . Sleep apnea       PERTINENT MEDICATIONS:  Outpatient Encounter Medications as of 01/08/2020  Medication Sig  . albuterol (PROVENTIL HFA;VENTOLIN HFA) 108 (90 Base) MCG/ACT inhaler Inhale 1 puff into the lungs 2 (two) times daily.  Marland Kitchen aspirin 81 MG chewable tablet Chew 81 mg by mouth daily.  . fluticasone (VERAMYST) 27.5 MCG/SPRAY nasal spray Place 1 spray into the nose daily as needed for rhinitis.   . Fluticasone-Salmeterol,sensor, 113-14 MCG/ACT AEPB Inhale 1 puff into the lungs 2 (two) times daily.  Marland Kitchen levothyroxine (SYNTHROID, LEVOTHROID) 112 MCG tablet Take 112 mcg by mouth daily before breakfast.  . loratadine (CLARITIN) 10 MG tablet Take 10 mg by mouth daily as needed for allergies.   . macitentan (OPSUMIT) 10 MG tablet Take 1 tablet (10 mg total) by mouth daily.  . [EXPIRED] midodrine (PROAMATINE) 10 MG tablet Take 1 tablet (10 mg total) by mouth 3 (three) times daily with meals.  . montelukast (SINGULAIR) 10 MG tablet Take 10 mg by mouth at bedtime.  . potassium chloride 20 MEQ/15ML (10%) SOLN Take 15 mLs (20 mEq total) by mouth 2 (two) times daily.  . sildenafil (REVATIO) 20 MG tablet Take 2 tablets (40 mg total) by mouth 3 (three) times daily.  . simvastatin (ZOCOR) 10 MG tablet Take 10 mg by mouth  at bedtime.  Marland Kitchen spironolactone (ALDACTONE) 25 MG tablet Take 0.5 tablets (12.5 mg total) by mouth daily.  Marland Kitchen torsemide (DEMADEX) 20 MG tablet Take 2 tablets (40 mg total) by mouth daily.  . [DISCONTINUED] ferrous sulfate 325 (65 FE) MG tablet Take 1 tablet (325 mg total) by mouth 2 (two) times daily with a meal.  . [DISCONTINUED] pantoprazole (PROTONIX) 40 MG tablet Take 1 tablet (40 mg total) by mouth 2 (two) times daily.   No facility-administered encounter medications on file as of 01/08/2020.     PHYSICAL EXAM:   General: NAD, Robinson appearing, elderly male in recliner Cardiovascular: regular rate and rhythm Pulmonary: scattered wheezing  O2 via [place] in use Abdomen: soft, nontender, + bowel sounds Extremities: no edema, no joint deformities Skin:  Exposed skin is intact Neurological: A&O x3, weakness but otherwise nonfocal  Gonzella Lex, NP

## 2020-02-19 NOTE — Progress Notes (Signed)
[facility] Consult Note Telephone: [phone]  Fax: [phone]  PATIENT NAME: Vincent Robinson DOB: 1923-07-01 MRN: [id number]  PRIMARY CARE PROVIDER:   Katherina Mires, MD  REFERRING PROVIDER:  Katherina Mires, MD [address] [place],  [place] [address]  RESPONSIBLE PARTY:   Self       RECOMMENDATIONS and PLAN:  Palliative care encounter  Z51.5  1.  Advance care planning: Re-addressed advanced directives since family discussion.  MOST form selections per pt were of full code, limited additional interventions, antibiotics if indicated, IV for a trial period and no tube feedings.  Document completed, uploaded to pt's chart and left in home with patient.  Wife and daughter present for discussion and agreed with his decisions.  His longterm goal is to avoid hospitalization and keep respiratory function at baseline. Palliative care will f/u in aprox 2 months.  2.  Shortness of breath:  At baseline.  Saturations in the 90's with walking farther distances.  Continue to monitor  I spent 45 minutes providing this consultation,  from 1300 to 1345. More than 50% of the time in this consultation was spent coordinating communication with patient, wife and daughter.   HISTORY OF PRESENT ILLNESS: Follow-up with Vincent Robinson .  He reports that he feels well, has been able to walk to his mailbox and been without recurrent illness or indury. Palliative Care was asked to help address goals of care.   CODE STATUS: Full Code  PPS: 50% HOSPICE ELIGIBILITY/DIAGNOSIS: TBD  PAST MEDICAL HISTORY:  Past Medical History:  Diagnosis Date  . AAA (abdominal aortic aneurysm) ([place])   . Acute respiratory failure ([place]) 11/23/2019  . Asthma   . Basal cell carcinoma   . Chronic respiratory failure ([place])   . COPD (chronic obstructive pulmonary disease) ([place])   . Coronary artery disease   . History of chronic respiratory failure   . Hyperlipidemia    . Hypothyroidism   . Right heart failure ([place])   . Skin cancer 2017  . Sleep apnea      PERTINENT MEDICATIONS:  Outpatient Encounter Medications as of 02/17/2020  Medication Sig  . albuterol (PROVENTIL HFA;VENTOLIN HFA) 108 (90 Base) MCG/ACT inhaler Inhale 1 puff into the lungs 2 (two) times daily.  Marland Kitchen aspirin 81 MG chewable tablet Chew 81 mg by mouth daily.  . ferrous sulfate 325 (65 FE) MG tablet Take 1 tablet (325 mg total) by mouth 2 (two) times daily with a meal.  . fluticasone (VERAMYST) 27.5 MCG/SPRAY nasal spray Place 1 spray into the nose daily as needed for rhinitis.   . Fluticasone-Salmeterol,sensor, 113-14 MCG/ACT AEPB Inhale 1 puff into the lungs 2 (two) times daily.  Marland Kitchen levothyroxine (SYNTHROID, LEVOTHROID) 112 MCG tablet Take 112 mcg by mouth daily before breakfast.  . loratadine (CLARITIN) 10 MG tablet Take 10 mg by mouth daily as needed for allergies.   . macitentan (OPSUMIT) 10 MG tablet Take 1 tablet (10 mg total) by mouth daily.  . montelukast (SINGULAIR) 10 MG tablet Take 10 mg by mouth at bedtime.  . pantoprazole (PROTONIX) 40 MG tablet Take 1 tablet (40 mg total) by mouth 2 (two) times daily.  . potassium chloride 20 MEQ/15ML (10%) SOLN Take 15 mLs (20 mEq total) by mouth 2 (two) times daily.  . sildenafil (REVATIO) 20 MG tablet Take 2 tablets (40 mg total) by mouth 3 (three) times daily.  . simvastatin (ZOCOR) 10 MG tablet Take 10 mg  by mouth at bedtime.  Marland Kitchen spironolactone (ALDACTONE) 25 MG tablet Take 0.5 tablets (12.5 mg total) by mouth daily.  Marland Kitchen torsemide (DEMADEX) 20 MG tablet Take 2 tablets (40 mg total) by mouth daily.   No facility-administered encounter medications on file as of 02/17/2020.    PHYSICAL EXAM:   General: NAD, chronically ill appearing elder male siting Cardiovascular: regular rate and rhythm Pulmonary: fine rales LLL otherwise clear Abdomen: soft, nontender, + bowel sounds Extremities: 1+ edema BLE, compression socks inuse. Skin: no rashes  Neurological: A&O x3, Weakness but otherwise nonfocal  Gonzella Lex, NP-C

## 2020-02-29 ENCOUNTER — Other Ambulatory Visit: Payer: Self-pay

## 2020-02-29 ENCOUNTER — Ambulatory Visit (HOSPITAL_COMMUNITY)
Admission: RE | Admit: 2020-02-29 | Discharge: 2020-02-29 | Disposition: A | Discharge status: Home / Self Care | Payer: Medicare Other | Source: Ambulatory Visit | Attending: Internal Medicine | Admitting: Internal Medicine

## 2020-02-29 VITALS — BP 108/56 | HR 83 | Wt 165.5 lb

## 2020-02-29 DIAGNOSIS — I2781 Cor pulmonale (chronic): Secondary | ICD-10-CM | POA: Insufficient documentation

## 2020-02-29 DIAGNOSIS — I50810 Right heart failure, unspecified: Secondary | ICD-10-CM | POA: Diagnosis not present

## 2020-02-29 DIAGNOSIS — I4821 Permanent atrial fibrillation: Secondary | ICD-10-CM

## 2020-02-29 DIAGNOSIS — Z7982 Long term (current) use of aspirin: Secondary | ICD-10-CM | POA: Diagnosis not present

## 2020-02-29 DIAGNOSIS — J9610 Chronic respiratory failure, unspecified whether with hypoxia or hypercapnia: Secondary | ICD-10-CM | POA: Diagnosis not present

## 2020-02-29 DIAGNOSIS — Z825 Family history of asthma and other chronic lower respiratory diseases: Secondary | ICD-10-CM | POA: Diagnosis not present

## 2020-02-29 DIAGNOSIS — Z85828 Personal history of other malignant neoplasm of skin: Secondary | ICD-10-CM | POA: Diagnosis not present

## 2020-02-29 DIAGNOSIS — E785 Hyperlipidemia, unspecified: Secondary | ICD-10-CM | POA: Diagnosis not present

## 2020-02-29 DIAGNOSIS — Z7951 Long term (current) use of inhaled steroids: Secondary | ICD-10-CM | POA: Insufficient documentation

## 2020-02-29 DIAGNOSIS — Z79899 Other long term (current) drug therapy: Secondary | ICD-10-CM | POA: Insufficient documentation

## 2020-02-29 DIAGNOSIS — I350 Nonrheumatic aortic (valve) stenosis: Secondary | ICD-10-CM | POA: Diagnosis not present

## 2020-02-29 DIAGNOSIS — J449 Chronic obstructive pulmonary disease, unspecified: Secondary | ICD-10-CM | POA: Insufficient documentation

## 2020-02-29 DIAGNOSIS — I4819 Other persistent atrial fibrillation: Secondary | ICD-10-CM | POA: Diagnosis not present

## 2020-02-29 DIAGNOSIS — Z87891 Personal history of nicotine dependence: Secondary | ICD-10-CM | POA: Diagnosis not present

## 2020-02-29 DIAGNOSIS — G4733 Obstructive sleep apnea (adult) (pediatric): Secondary | ICD-10-CM | POA: Insufficient documentation

## 2020-02-29 DIAGNOSIS — Z8249 Family history of ischemic heart disease and other diseases of the circulatory system: Secondary | ICD-10-CM | POA: Diagnosis not present

## 2020-02-29 DIAGNOSIS — Z951 Presence of aortocoronary bypass graft: Secondary | ICD-10-CM | POA: Diagnosis not present

## 2020-02-29 DIAGNOSIS — E039 Hypothyroidism, unspecified: Secondary | ICD-10-CM | POA: Diagnosis not present

## 2020-02-29 DIAGNOSIS — Z79890 Hormone replacement therapy (postmenopausal): Secondary | ICD-10-CM | POA: Diagnosis not present

## 2020-02-29 DIAGNOSIS — I2510 Atherosclerotic heart disease of native coronary artery without angina pectoris: Secondary | ICD-10-CM | POA: Diagnosis present

## 2020-02-29 DIAGNOSIS — I272 Pulmonary hypertension, unspecified: Secondary | ICD-10-CM | POA: Diagnosis not present

## 2020-02-29 DIAGNOSIS — Z823 Family history of stroke: Secondary | ICD-10-CM | POA: Insufficient documentation

## 2020-02-29 DIAGNOSIS — G4710 Hypersomnia, unspecified: Secondary | ICD-10-CM | POA: Insufficient documentation

## 2020-02-29 LAB — BASIC METABOLIC PANEL WITH GFR
Anion gap: 13 (ref 5–15)
BUN: 24 mg/dL — ABNORMAL HIGH (ref 8–23)
CO2: 25 mmol/L (ref 22–32)
Calcium: 8.8 mg/dL — ABNORMAL LOW (ref 8.9–10.3)
Chloride: 101 mmol/L (ref 98–111)
Creatinine, Ser: 1.11 mg/dL (ref 0.61–1.24)
GFR calc Af Amer: >60 mL/min
GFR calc non Af Amer: 57 mL/min — ABNORMAL LOW
Glucose, Bld: 83 mg/dL (ref 70–99)
Potassium: 3.6 mmol/L (ref 3.5–5.1)
Sodium: 139 mmol/L (ref 135–145)

## 2020-02-29 LAB — CBC
HCT: 40.5 % (ref 39.0–52.0)
Hemoglobin: 13 g/dL (ref 13.0–17.0)
MCH: 30 pg (ref 26.0–34.0)
MCHC: 32.1 g/dL (ref 30.0–36.0)
MCV: 93.3 fL (ref 80.0–100.0)
Platelets: 318 K/uL (ref 150–400)
RBC: 4.34 MIL/uL (ref 4.22–5.81)
RDW: 20.8 % — ABNORMAL HIGH (ref 11.5–15.5)
WBC: 9.5 K/uL (ref 4.0–10.5)
nRBC: 0 % (ref 0.0–0.2)

## 2020-02-29 LAB — BRAIN NATRIURETIC PEPTIDE: B Natriuretic Peptide: 161.5 pg/mL — ABNORMAL HIGH (ref 0.0–100.0)

## 2020-02-29 NOTE — Patient Instructions (Signed)
Labs done today. We will contact you only if your labs are abnormal.  No medication changes were made. Please continue all current medications as prescribed.  Your physician recommends that you schedule a follow-up appointment in: 6 months. Please contact our office in October to schedule a November appointment.  At the [facility], you and your health needs are our priority. As part of our continuing mission to provide you with exceptional heart care, we have created designated Provider Care Teams. These Care Teams include your primary Cardiologist (physician) and Advanced Practice Providers (APPs- Physician Assistants and Nurse Practitioners) who all work together to provide you with the care you need, when you need it.   You may see any of the following providers on your designated Care Team at your next follow up: Marland Kitchen Dr Glori Bickers . Dr Loralie Champagne . Darrick Grinder, NP . Lyda Jester, PA . Audry Riles, PharmD   Please be sure to bring in all your medications bottles to every appointment.

## 2020-02-29 NOTE — Progress Notes (Signed)
Pt in for his clinic appt, he has a abrasion to R hand, he states he hit it on the car on the way here. The bleeding has stopped. Site cleaned and aleve dressing applied.

## 2020-02-29 NOTE — Addendum Note (Signed)
Encounter addended by: Shonna Chock, CMA on: [id number] 3:26 PM  Actions taken: Order list changed, Diagnosis association updated, Clinical Note Signed, Charge Capture section accepted

## 2020-02-29 NOTE — Progress Notes (Signed)
ADVANCED HF CLINIC NOTE  Referring Physician: Dr. Gwenlyn Found  Primary Cardiologist: Dr. Gwenlyn Found   HPI:  [name] is 86 year old with past history of COPD, hypersomnia with sleep apnea, allergic rhinitis, PAF, cough variant asthma. He recently moved from [place] to be close to his family and wants to establish care here. He is chief complaints of dyspnea on exertion. He has history of sleep apnea on BiPAP at settings of 20/6. This is report of CT of the chest from [place] which shows calcified nodule, chronic scarring. 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 He moved to [place] in 1956. Apparently his immigration was delayed due to a lung abnormality on imaging. He was eventually cleared. He does not have any history of TB, exposure to TB. He's had an episode of influenza while in [place].  Hospitalized in July, 2018 for a spontaneous pneumothorax that was treated conservatively. He was again readmitted in August 2018 for lower GI bleed (diverticular). During this admission he was noted to be in acute hypoxic respiratory failure secondary to diastolic heart failure. He improved with diuresis.  He had coronary artery bypass grafting in 1998. He had an endoluminal stent graft for abdominal aortic aneurysm in [place] 10/09 . Pre-op cath showed patent grafts.  Doppler performed 2/18 revealed aortic dimensions of approximately 4.4 x 4.6 cm.   Has been followed by Dr. Vaughan Browner. Smoked a little bit in remote past but quit a long time ago. No h/o connective tissue disease. Wears BIPAP for severe OSA. Has been on home O2 since 2017. Dr. Vaughan Browner has arranged for BIPAP titration and repeat hi-res CT. BiPAP download December 2017-January 2018. AHI 7.2  6MW 02/2018: 820 feet, O2 sats 89-92% on 2L, HR 98-123  Echo 11/19: EF 60-65% RV mild to moderately reduced. RVSP 57mmHG Mild  AS  Hospitalized in 2/21 for volume overload and hypoxia. Diuresed well. Switched to torsemide. Started on midodrine for low BP.,   He returns today for f/u. Says he is feeling great. Can do all ADLs without problem. Sats 92-94% on 2L [place] O2. BP stable on midodrine. No dizziness. No CP. No edema. Weight stable around 162.   Studies:  RHC 12/11/17 RA = 6 RV = 72/7 PA = 73/27 (44) PCW = 10 Fick cardiac output/index = 4.1/2.1 PVR = 8.4 WU Ao sat = 91% PA sat = 58%, 58%  Echo 11/18 EF 60-65% grade I DD  RV severely dilated moderate TR RVSP 4mmHG   VQ 12/18 Normal perfusion lung scan.  Irregular peripheral ventilation in both lungs consistent with chronic parenchymal lung disease changes.  PFTs 02/28/16 FVC 3.37 (116%), FEV1 2.08 (108%), F/F 62 Mild obstructive airway disease, no broncho-dilator response  CT scan 07/15/15- 2 .3 x 1.7 cm right upper lobe calcified opacity likely to represent pleural , parenchymal scarring. Mild centrilobular, paraseptal emphysema. Subpleural reticulation, fibrosis representing mild chronic ILD High-resolution CT 09/04/16 - stable right upper lobe scarring, hyper aerated lungs. Minimal interstitial lung disease and peripheral unchanged compared to 2016.  Hi-res CT: 01/06/18: Stable ILD   Review of systems complete and found to be negative unless listed in HPI.   Past Medical History:  Diagnosis Date  . AAA (abdominal aortic aneurysm) ([place])   . Acute respiratory failure ([place]) 11/23/2019  . Asthma   . Basal cell carcinoma   . Chronic  respiratory failure ([place])   . COPD (chronic obstructive pulmonary disease) ([place])   . Coronary artery disease   . History of chronic respiratory failure   . Hyperlipidemia   . Hypothyroidism   . Right heart failure ([place])   . Skin cancer 2017  . Sleep apnea     Current Outpatient Medications  Medication Sig Dispense Refill  . albuterol (PROVENTIL HFA;VENTOLIN HFA) 108 (90 Base) MCG/ACT inhaler Inhale 1 puff  into the lungs 2 (two) times daily. 1 Inhaler 2  . aspirin 81 MG chewable tablet Chew 81 mg by mouth daily.    . ferrous sulfate 325 (65 FE) MG tablet Take 1 tablet (325 mg total) by mouth 2 (two) times daily with a meal. 60 tablet 3  . fluticasone (VERAMYST) 27.5 MCG/SPRAY nasal spray Place 1 spray into the nose daily as needed for rhinitis.     . Fluticasone-Salmeterol,sensor, 113-14 MCG/ACT AEPB Inhale 1 puff into the lungs 2 (two) times daily.    Marland Kitchen levothyroxine (SYNTHROID, LEVOTHROID) 112 MCG tablet Take 112 mcg by mouth daily before breakfast.    . loratadine (CLARITIN) 10 MG tablet Take 10 mg by mouth daily as needed for allergies.     . macitentan (OPSUMIT) 10 MG tablet Take 1 tablet (10 mg total) by mouth daily. 30 tablet 0  . midodrine (PROAMATINE) 10 MG tablet Take 10 mg by mouth 3 (three) times daily.    . montelukast (SINGULAIR) 10 MG tablet Take 10 mg by mouth at bedtime.    . pantoprazole (PROTONIX) 20 MG tablet Take 20 mg by mouth daily.    . potassium chloride 20 MEQ/15ML (10%) SOLN Take 15 mLs (20 mEq total) by mouth 2 (two) times daily. 900 mL 1  . sildenafil (REVATIO) 20 MG tablet Take 2 tablets (40 mg total) by mouth 3 (three) times daily. 540 tablet 3  . simvastatin (ZOCOR) 10 MG tablet Take 10 mg by mouth at bedtime.    Marland Kitchen spironolactone (ALDACTONE) 25 MG tablet Take 0.5 tablets (12.5 mg total) by mouth daily. 30 tablet 1  . torsemide (DEMADEX) 20 MG tablet Take 2 tablets (40 mg total) by mouth daily. 60 tablet 1   No current facility-administered medications for this encounter.    No Known Allergies    Social History   Socioeconomic History  . Marital status: Married    Spouse name: Not on file  . Number of children: Not on file  . Years of education: Not on file  . Highest education level: Not on file  Occupational History  . Not on file  Tobacco Use  . Smoking status: Former Smoker    Packs/day: 0.50    Quit date: 08/06/1961    Years since quitting: 58.6   . Smokeless tobacco: Never Used  Substance and Sexual Activity  . Alcohol use: Not Currently  . Drug use: Never  . Sexual activity: Not on file  Other Topics Concern  . Not on file  Social History Narrative  . Not on file   Social Determinants of Health   Financial Resource Strain:   . Difficulty of Paying Living Expenses:   Food Insecurity:   . Worried About Charity fundraiser in the Last Year:   . Arboriculturist in the Last Year:   Transportation Needs:   . Film/video editor (Medical):   Marland Kitchen Lack of Transportation (Non-Medical):   Physical Activity:   . Days of Exercise per Week:   . Minutes  of Exercise per Session:   Stress:   . Feeling of Stress :   Social Connections:   . Frequency of Communication with Friends and Family:   . Frequency of Social Gatherings with Friends and Family:   . Attends Religious Services:   . Active Member of Clubs or Organizations:   . Attends Archivist Meetings:   Marland Kitchen Marital Status:   Intimate Partner Violence:   . Fear of Current or Ex-Partner:   . Emotionally Abused:   Marland Kitchen Physically Abused:   . Sexually Abused:       Family History  Problem Relation Age of Onset  . Emphysema Mother   . Stroke Father   . Movement disorder Sister   . Aneurysm Brother        Thoracic aorta  . Macular degeneration Brother     Vitals:   02/29/20 1502  BP: (!) 108/56  Pulse: 83  SpO2: 92%  Weight: 75.1 kg (165 lb 8 oz)   Wt Readings from Last 3 Encounters:  02/29/20 75.1 kg (165 lb 8 oz)  12/30/19 76.8 kg (169 lb 6.4 oz)  12/21/19 78 kg (171 lb 15.3 oz)    PHYSICAL EXAM: General:  Elderly No resp difficulty on O2 HEENT: normal Neck: supple. JVP 7 Carotids 2+ bilat; no bruits. No lymphadenopathy or thryomegaly appreciated. Cor: PMI nondisplaced. Irregular rate & rhythm. 2/6 AS Lungs: clear with decreased BS throughout Abdomen: soft, nontender, nondistended. No hepatosplenomegaly. No bruits or masses. Good bowel  sounds. Extremities: no cyanosis, clubbing, rash, edema Neuro: alert & orientedx3, cranial nerves grossly intact. moves all 4 extremities w/o difficulty. Affect pleasant  ASSESSMENT & PLAN:  1. Pulmonary hypertension with cor pulmonale - Echo, CT, PFTs and VQ studies reviewed personally - He has evidence of significant pulmonary HTN with severe RV failure. Etiology remains unclear.  - CT with evidence of ILD but PFTs not that bad. VQ negative.  - Suspect PH multifactorial but PAH out of proportion to LV pressures and ILD so suspect he must have component of Group I disease - RHC 2/19 c/w moderate PAH - Doing well with sildenafil without evidence of shunting. Continue 40 mg tid  And macitentan 10 daily - Volume status stable on torsemide 40 daily - Check labs today - Continue compression stockings   2. Chronic respiratory failure - evidence of mild ILD on CT. PFTs not too bad - has severe OSA on Bipap. Continue BiPAP qHS - he uses it religiously - continue supplemental O2. sats look good today - Follows with Pulmonary   3. CAD s/p CABG - No s/s angina. Myoview 1/19 normal.  - Managed by Dr. Gwenlyn Found. On ASA and statin  4. Aortic stenosis - Moderate by echo 2/21 mean gradient 22. Will follow   5. Atrial fibrillation, persistent - Rate controlled. Not candidate for [facility] due to recurrent LGIB   Glori Bickers, MD  3:12 PM

## 2020-03-03 ENCOUNTER — Other Ambulatory Visit (HOSPITAL_COMMUNITY): Payer: Self-pay | Admitting: *Deleted

## 2020-03-03 MED ORDER — TORSEMIDE 20 MG PO TABS: 40.0000 mg | ORAL_TABLET | Freq: Every day | ORAL | 6 refills | Status: DC

## 2020-03-03 MED ORDER — MIDODRINE HCL 10 MG PO TABS: 10.0000 mg | ORAL_TABLET | Freq: Three times a day (TID) | ORAL | 6 refills | Status: DC

## 2020-03-21 ENCOUNTER — Inpatient Hospital Stay (HOSPITAL_COMMUNITY)
Admission: EM | Admit: 2020-03-21 | Discharge: 2020-03-26 | DRG: 378 | Disposition: A | Discharge status: Home / Self Care | Payer: Medicare Other | Attending: Internal Medicine | Admitting: Internal Medicine

## 2020-03-21 ENCOUNTER — Other Ambulatory Visit: Payer: Self-pay

## 2020-03-21 DIAGNOSIS — J449 Chronic obstructive pulmonary disease, unspecified: Secondary | ICD-10-CM | POA: Diagnosis present

## 2020-03-21 DIAGNOSIS — Z8679 Personal history of other diseases of the circulatory system: Secondary | ICD-10-CM

## 2020-03-21 DIAGNOSIS — I9789 Other postprocedural complications and disorders of the circulatory system, not elsewhere classified: Secondary | ICD-10-CM | POA: Diagnosis present

## 2020-03-21 DIAGNOSIS — D62 Acute posthemorrhagic anemia: Secondary | ICD-10-CM | POA: Diagnosis present

## 2020-03-21 DIAGNOSIS — Z951 Presence of aortocoronary bypass graft: Secondary | ICD-10-CM

## 2020-03-21 DIAGNOSIS — J849 Interstitial pulmonary disease, unspecified: Secondary | ICD-10-CM | POA: Diagnosis present

## 2020-03-21 DIAGNOSIS — Z79899 Other long term (current) drug therapy: Secondary | ICD-10-CM

## 2020-03-21 DIAGNOSIS — I350 Nonrheumatic aortic (valve) stenosis: Secondary | ICD-10-CM

## 2020-03-21 DIAGNOSIS — I4819 Other persistent atrial fibrillation: Secondary | ICD-10-CM | POA: Diagnosis present

## 2020-03-21 DIAGNOSIS — I5082 Biventricular heart failure: Secondary | ICD-10-CM | POA: Diagnosis present

## 2020-03-21 DIAGNOSIS — J9611 Chronic respiratory failure with hypoxia: Secondary | ICD-10-CM | POA: Diagnosis present

## 2020-03-21 DIAGNOSIS — I272 Pulmonary hypertension, unspecified: Secondary | ICD-10-CM | POA: Diagnosis present

## 2020-03-21 DIAGNOSIS — I2729 Other secondary pulmonary hypertension: Secondary | ICD-10-CM | POA: Diagnosis present

## 2020-03-21 DIAGNOSIS — Z79890 Hormone replacement therapy (postmenopausal): Secondary | ICD-10-CM

## 2020-03-21 DIAGNOSIS — Z85828 Personal history of other malignant neoplasm of skin: Secondary | ICD-10-CM

## 2020-03-21 DIAGNOSIS — K922 Gastrointestinal hemorrhage, unspecified: Secondary | ICD-10-CM | POA: Diagnosis present

## 2020-03-21 DIAGNOSIS — E785 Hyperlipidemia, unspecified: Secondary | ICD-10-CM | POA: Diagnosis present

## 2020-03-21 DIAGNOSIS — N179 Acute kidney failure, unspecified: Secondary | ICD-10-CM | POA: Diagnosis present

## 2020-03-21 DIAGNOSIS — K5731 Diverticulosis of large intestine without perforation or abscess with bleeding: Secondary | ICD-10-CM | POA: Diagnosis not present

## 2020-03-21 DIAGNOSIS — Z7951 Long term (current) use of inhaled steroids: Secondary | ICD-10-CM

## 2020-03-21 DIAGNOSIS — Z825 Family history of asthma and other chronic lower respiratory diseases: Secondary | ICD-10-CM

## 2020-03-21 DIAGNOSIS — K625 Hemorrhage of anus and rectum: Secondary | ICD-10-CM | POA: Diagnosis not present

## 2020-03-21 DIAGNOSIS — Z87891 Personal history of nicotine dependence: Secondary | ICD-10-CM

## 2020-03-21 DIAGNOSIS — Z9981 Dependence on supplemental oxygen: Secondary | ICD-10-CM

## 2020-03-21 DIAGNOSIS — Z20822 Contact with and (suspected) exposure to covid-19: Secondary | ICD-10-CM | POA: Diagnosis present

## 2020-03-21 DIAGNOSIS — E039 Hypothyroidism, unspecified: Secondary | ICD-10-CM | POA: Diagnosis present

## 2020-03-21 DIAGNOSIS — I5032 Chronic diastolic (congestive) heart failure: Secondary | ICD-10-CM | POA: Diagnosis present

## 2020-03-21 DIAGNOSIS — Y832 Surgical operation with anastomosis, bypass or graft as the cause of abnormal reaction of the patient, or of later complication, without mention of misadventure at the time of the procedure: Secondary | ICD-10-CM | POA: Diagnosis present

## 2020-03-21 DIAGNOSIS — Z823 Family history of stroke: Secondary | ICD-10-CM

## 2020-03-21 DIAGNOSIS — Z952 Presence of prosthetic heart valve: Secondary | ICD-10-CM

## 2020-03-21 DIAGNOSIS — I9589 Other hypotension: Secondary | ICD-10-CM | POA: Diagnosis present

## 2020-03-21 DIAGNOSIS — I2510 Atherosclerotic heart disease of native coronary artery without angina pectoris: Secondary | ICD-10-CM | POA: Diagnosis present

## 2020-03-21 DIAGNOSIS — I2781 Cor pulmonale (chronic): Secondary | ICD-10-CM | POA: Diagnosis present

## 2020-03-21 DIAGNOSIS — Z7982 Long term (current) use of aspirin: Secondary | ICD-10-CM

## 2020-03-22 ENCOUNTER — Observation Stay (HOSPITAL_COMMUNITY): Payer: Medicare Other

## 2020-03-22 ENCOUNTER — Encounter (HOSPITAL_COMMUNITY): Payer: Self-pay | Admitting: Internal Medicine

## 2020-03-22 ENCOUNTER — Other Ambulatory Visit: Payer: Self-pay

## 2020-03-22 DIAGNOSIS — K5731 Diverticulosis of large intestine without perforation or abscess with bleeding: Secondary | ICD-10-CM | POA: Diagnosis not present

## 2020-03-22 DIAGNOSIS — K625 Hemorrhage of anus and rectum: Secondary | ICD-10-CM | POA: Diagnosis present

## 2020-03-22 DIAGNOSIS — K922 Gastrointestinal hemorrhage, unspecified: Secondary | ICD-10-CM | POA: Diagnosis present

## 2020-03-22 DIAGNOSIS — I272 Pulmonary hypertension, unspecified: Secondary | ICD-10-CM | POA: Diagnosis not present

## 2020-03-22 DIAGNOSIS — K921 Melena: Secondary | ICD-10-CM

## 2020-03-22 HISTORY — PX: IR ANGIOGRAM SELECTIVE EACH ADDITIONAL VESSEL: IMG667

## 2020-03-22 HISTORY — PX: IR ANGIOGRAM VISCERAL SELECTIVE: IMG657

## 2020-03-22 HISTORY — PX: IR US GUIDE VASC ACCESS RIGHT: IMG2390

## 2020-03-22 LAB — CBC WITH DIFFERENTIAL/PLATELET
Abs Immature Granulocytes: 0.03 10*3/uL (ref 0.00–0.07)
Basophils Absolute: 0.1 10*3/uL (ref 0.0–0.1)
Basophils Relative: 1 %
Eosinophils Absolute: 0.3 10*3/uL (ref 0.0–0.5)
Eosinophils Relative: 3 %
HCT: 38.6 % — ABNORMAL LOW (ref 39.0–52.0)
Hemoglobin: 12.6 g/dL — ABNORMAL LOW (ref 13.0–17.0)
Immature Granulocytes: 0 %
Lymphocytes Relative: 24 %
Lymphs Abs: 2.5 10*3/uL (ref 0.7–4.0)
MCH: 31.3 pg (ref 26.0–34.0)
MCHC: 32.6 g/dL (ref 30.0–36.0)
MCV: 96 fL (ref 80.0–100.0)
Monocytes Absolute: 1.1 10*3/uL — ABNORMAL HIGH (ref 0.1–1.0)
Monocytes Relative: 11 %
Neutro Abs: 6.2 10*3/uL (ref 1.7–7.7)
Neutrophils Relative %: 61 %
Platelets: 315 10*3/uL (ref 150–400)
RBC: 4.02 MIL/uL — ABNORMAL LOW (ref 4.22–5.81)
RDW: 17.5 % — ABNORMAL HIGH (ref 11.5–15.5)
WBC: 10.2 10*3/uL (ref 4.0–10.5)
nRBC: 0 % (ref 0.0–0.2)

## 2020-03-22 LAB — CBC
HCT: 33.9 % — ABNORMAL LOW (ref 39.0–52.0)
HCT: 33.3 % — ABNORMAL LOW (ref 39.0–52.0)
HCT: 35.7 % — ABNORMAL LOW (ref 39.0–52.0)
HCT: 27 % — ABNORMAL LOW (ref 39.0–52.0)
Hemoglobin: 11.1 g/dL — ABNORMAL LOW (ref 13.0–17.0)
Hemoglobin: 11 g/dL — ABNORMAL LOW (ref 13.0–17.0)
Hemoglobin: 11.7 g/dL — ABNORMAL LOW (ref 13.0–17.0)
Hemoglobin: 8.7 g/dL — ABNORMAL LOW (ref 13.0–17.0)
MCH: 31.7 pg (ref 26.0–34.0)
MCH: 32.2 pg (ref 26.0–34.0)
MCH: 32.1 pg (ref 26.0–34.0)
MCH: 31.8 pg (ref 26.0–34.0)
MCHC: 32.7 g/dL (ref 30.0–36.0)
MCHC: 33 g/dL (ref 30.0–36.0)
MCHC: 32.8 g/dL (ref 30.0–36.0)
MCHC: 32.2 g/dL (ref 30.0–36.0)
MCV: 96.9 fL (ref 80.0–100.0)
MCV: 97.4 fL (ref 80.0–100.0)
MCV: 97.8 fL (ref 80.0–100.0)
MCV: 98.5 fL (ref 80.0–100.0)
Platelets: 272 10*3/uL (ref 150–400)
Platelets: 276 10*3/uL (ref 150–400)
Platelets: 340 10*3/uL (ref 150–400)
Platelets: 253 10*3/uL (ref 150–400)
RBC: 3.5 MIL/uL — ABNORMAL LOW (ref 4.22–5.81)
RBC: 3.42 MIL/uL — ABNORMAL LOW (ref 4.22–5.81)
RBC: 3.65 MIL/uL — ABNORMAL LOW (ref 4.22–5.81)
RBC: 2.74 MIL/uL — ABNORMAL LOW (ref 4.22–5.81)
RDW: 17.4 % — ABNORMAL HIGH (ref 11.5–15.5)
RDW: 17.4 % — ABNORMAL HIGH (ref 11.5–15.5)
RDW: 17.5 % — ABNORMAL HIGH (ref 11.5–15.5)
RDW: 17.4 % — ABNORMAL HIGH (ref 11.5–15.5)
WBC: 11.3 10*3/uL — ABNORMAL HIGH (ref 4.0–10.5)
WBC: 10.7 10*3/uL — ABNORMAL HIGH (ref 4.0–10.5)
WBC: 12.4 10*3/uL — ABNORMAL HIGH (ref 4.0–10.5)
WBC: 9.9 10*3/uL (ref 4.0–10.5)
nRBC: 0 % (ref 0.0–0.2)
nRBC: 0 % (ref 0.0–0.2)
nRBC: 0 % (ref 0.0–0.2)
nRBC: 0 % (ref 0.0–0.2)

## 2020-03-22 LAB — BASIC METABOLIC PANEL
Anion gap: 10 (ref 5–15)
Anion gap: 9 (ref 5–15)
BUN: 23 mg/dL (ref 8–23)
BUN: 23 mg/dL (ref 8–23)
CO2: 28 mmol/L (ref 22–32)
CO2: 25 mmol/L (ref 22–32)
Calcium: 8.6 mg/dL — ABNORMAL LOW (ref 8.9–10.3)
Calcium: 8 mg/dL — ABNORMAL LOW (ref 8.9–10.3)
Chloride: 102 mmol/L (ref 98–111)
Chloride: 105 mmol/L (ref 98–111)
Creatinine, Ser: 1.39 mg/dL — ABNORMAL HIGH (ref 0.61–1.24)
Creatinine, Ser: 1.24 mg/dL (ref 0.61–1.24)
GFR calc Af Amer: 50 mL/min — ABNORMAL LOW (ref >60)
GFR calc Af Amer: 57 mL/min — ABNORMAL LOW (ref >60)
GFR calc non Af Amer: 43 mL/min — ABNORMAL LOW (ref >60)
GFR calc non Af Amer: 49 mL/min — ABNORMAL LOW (ref >60)
Glucose, Bld: 112 mg/dL — ABNORMAL HIGH (ref 70–99)
Glucose, Bld: 107 mg/dL — ABNORMAL HIGH (ref 70–99)
Potassium: 3.9 mmol/L (ref 3.5–5.1)
Potassium: 3.9 mmol/L (ref 3.5–5.1)
Sodium: 140 mmol/L (ref 135–145)
Sodium: 139 mmol/L (ref 135–145)

## 2020-03-22 LAB — CBG MONITORING, ED
Glucose-Capillary: 87 mg/dL (ref 70–99)
Glucose-Capillary: 94 mg/dL (ref 70–99)

## 2020-03-22 LAB — PROTIME-INR
INR: 1.2 (ref 0.8–1.2)
Prothrombin Time: 14.9 seconds (ref 11.4–15.2)

## 2020-03-22 LAB — SARS CORONAVIRUS 2 BY RT PCR (HOSPITAL ORDER, PERFORMED IN ~~LOC~~ HOSPITAL LAB): SARS Coronavirus 2: NEGATIVE

## 2020-03-22 LAB — GLUCOSE, CAPILLARY: Glucose-Capillary: 125 mg/dL — ABNORMAL HIGH (ref 70–99)

## 2020-03-22 MED ORDER — IOHEXOL 300 MG/ML  SOLN
100.0000 mL | Freq: Once | INTRAMUSCULAR | Status: AC | PRN
  Administered 2020-03-22: 50 mL via INTRA_ARTERIAL

## 2020-03-22 MED ORDER — ONDANSETRON HCL 4 MG PO TABS: 4.0000 mg | ORAL_TABLET | Freq: Four times a day (QID) | ORAL | Status: DC | PRN

## 2020-03-22 MED ORDER — FLUTICASONE-SALMETEROL(SENSOR) 113-14 MCG/ACT IN AEPB: 1.0000 | INHALATION_SPRAY | Freq: Two times a day (BID) | RESPIRATORY_TRACT | Status: DC

## 2020-03-22 MED ORDER — SODIUM CHLORIDE 0.9 % IV SOLN: INTRAVENOUS | Status: DC

## 2020-03-22 MED ORDER — LIDOCAINE HCL 1 % IJ SOLN
INTRAMUSCULAR | Status: AC
  Filled 2020-03-22: qty 20

## 2020-03-22 MED ORDER — FERROUS SULFATE 325 (65 FE) MG PO TABS
325.0000 mg | ORAL_TABLET | Freq: Two times a day (BID) | ORAL | Status: DC
  Administered 2020-03-22: 325 mg via ORAL
  Filled 2020-03-22 (×2): qty 1

## 2020-03-22 MED ORDER — SILDENAFIL CITRATE 20 MG PO TABS
40.0000 mg | ORAL_TABLET | Freq: Three times a day (TID) | ORAL | Status: DC
  Administered 2020-03-22 – 2020-03-26 (×12): 40 mg via ORAL
  Filled 2020-03-22 (×14): qty 2

## 2020-03-22 MED ORDER — MACITENTAN 10 MG PO TABS
10.0000 mg | ORAL_TABLET | Freq: Every day | ORAL | Status: DC
  Administered 2020-03-22 – 2020-03-26 (×5): 10 mg via ORAL
  Filled 2020-03-22 (×5): qty 1

## 2020-03-22 MED ORDER — ALBUTEROL SULFATE (2.5 MG/3ML) 0.083% IN NEBU: 2.5000 mg | INHALATION_SOLUTION | RESPIRATORY_TRACT | Status: DC | PRN

## 2020-03-22 MED ORDER — SODIUM CHLORIDE (PF) 0.9 % IJ SOLN
INTRAMUSCULAR | Status: AC
  Filled 2020-03-22: qty 50

## 2020-03-22 MED ORDER — IOHEXOL 300 MG/ML  SOLN
100.0000 mL | Freq: Once | INTRAMUSCULAR | Status: AC | PRN
  Administered 2020-03-22: 20 mL via INTRA_ARTERIAL

## 2020-03-22 MED ORDER — IOHEXOL 350 MG/ML SOLN
75.0000 mL | Freq: Once | INTRAVENOUS | Status: AC | PRN
  Administered 2020-03-22: 75 mL via INTRAVENOUS

## 2020-03-22 MED ORDER — ALBUTEROL SULFATE HFA 108 (90 BASE) MCG/ACT IN AERS: 1.0000 | INHALATION_SPRAY | Freq: Two times a day (BID) | RESPIRATORY_TRACT | Status: DC

## 2020-03-22 MED ORDER — MOMETASONE FURO-FORMOTEROL FUM 100-5 MCG/ACT IN AERO
2.0000 | INHALATION_SPRAY | Freq: Two times a day (BID) | RESPIRATORY_TRACT | Status: DC
  Administered 2020-03-23 – 2020-03-26 (×7): 2 via RESPIRATORY_TRACT
  Filled 2020-03-22 (×2): qty 8.8

## 2020-03-22 MED ORDER — PANTOPRAZOLE SODIUM 20 MG PO TBEC
20.0000 mg | DELAYED_RELEASE_TABLET | Freq: Two times a day (BID) | ORAL | Status: DC
  Administered 2020-03-22 – 2020-03-26 (×9): 20 mg via ORAL
  Filled 2020-03-22 (×10): qty 1

## 2020-03-22 MED ORDER — SIMVASTATIN 10 MG PO TABS
10.0000 mg | ORAL_TABLET | Freq: Every day | ORAL | Status: DC
  Administered 2020-03-22 – 2020-03-25 (×4): 10 mg via ORAL
  Filled 2020-03-22 (×4): qty 1

## 2020-03-22 MED ORDER — ALBUTEROL SULFATE (2.5 MG/3ML) 0.083% IN NEBU
2.5000 mg | INHALATION_SOLUTION | Freq: Two times a day (BID) | RESPIRATORY_TRACT | Status: DC
  Administered 2020-03-22: 2.5 mg via RESPIRATORY_TRACT
  Filled 2020-03-22 (×2): qty 3

## 2020-03-22 MED ORDER — ONDANSETRON HCL 4 MG/2ML IJ SOLN: 4.0000 mg | Freq: Four times a day (QID) | INTRAMUSCULAR | Status: DC | PRN

## 2020-03-22 MED ORDER — LEVOTHYROXINE SODIUM 112 MCG PO TABS
112.0000 ug | ORAL_TABLET | Freq: Every day | ORAL | Status: DC
  Administered 2020-03-22 – 2020-03-26 (×5): 112 ug via ORAL
  Filled 2020-03-22 (×5): qty 1

## 2020-03-22 MED ORDER — MIDAZOLAM HCL 2 MG/2ML IJ SOLN
INTRAMUSCULAR | Status: AC
  Filled 2020-03-22: qty 2

## 2020-03-22 MED ORDER — MIDODRINE HCL 5 MG PO TABS
10.0000 mg | ORAL_TABLET | Freq: Three times a day (TID) | ORAL | Status: DC
  Administered 2020-03-22 – 2020-03-26 (×13): 10 mg via ORAL
  Filled 2020-03-22 (×17): qty 2

## 2020-03-22 NOTE — H&P (Signed)
History and Physical    Vincent Robinson [id number] DOB: 1923-08-04 DOA: 03/21/2020  PCP: Katherina Mires, MD  Patient coming from: Home.  Chief Complaint: Rectal bleeding.  HPI: Vincent Robinson is a 86 y.o. male with history of severe pulmonary hypertension with cor pulmonale and chronic diastolic CHF persistent A. fib CAD and has been previously admitted for rectal bleeding in September 2020 at that time it was attributed to possibly diverticular bleed has had sigmoidoscopy in 2018 which did not show any alarming features presents to the ER after patient had 2 large bloody bowel movements.  Denies any abdominal pain nausea vomiting or fever chills.  Has not had any chest pain or shortness of breath or dizziness.  Takes aspirin.  ED Course: In the ER patient is hemodynamically stable has not had any further episodes but given the comorbidities patient will be admitted for further observation.  Patient hemoglobin is around 12.6 which is a drop from 13 about 3 weeks ago.  Covid test was negative.  Review of Systems: As per HPI, rest all negative.   Past Medical History:  Diagnosis Date  . AAA (abdominal aortic aneurysm) ([place])   . Acute respiratory failure ([place]) 11/23/2019  . Asthma   . Basal cell carcinoma   . Chronic respiratory failure ([place])   . COPD (chronic obstructive pulmonary disease) ([place])   . Coronary artery disease   . History of chronic respiratory failure   . Hyperlipidemia   . Hypothyroidism   . Right heart failure ([place])   . Skin cancer 2017  . Sleep apnea     Past Surgical History:  Procedure Laterality Date  . bleeding ulcer  2016  . BYPASS AXILLA/BRACHIAL ARTERY  1998  . colon bleed  2012  . COLONOSCOPY N/A 06/07/2017   Procedure: COLONOSCOPY;  Surgeon: Carol Ada, MD;  Location: [facility];  Service: Endoscopy;  Laterality: N/A;  . DEBRIDMENT OF DECUBITUS ULCER  2002  . RIGHT HEART CATH N/A 12/11/2017   Procedure: RIGHT HEART CATH;  Surgeon: Jolaine Artist, MD;  Location: [facility];  Service: Cardiovascular;  Laterality: N/A;  . THORACIC AORTA STENT  2009     reports that he quit smoking about 58 years ago. He smoked 0.50 packs per day. He has never used smokeless tobacco. He reports previous alcohol use. He reports that he does not use drugs.  No Known Allergies  Family History  Problem Relation Age of Onset  . Emphysema Mother   . Stroke Father   . Movement disorder Sister   . Aneurysm Brother        Thoracic aorta  . Macular degeneration Brother     Prior to Admission medications   Medication Sig Start Date End Date Taking? Authorizing Provider  albuterol (PROVENTIL HFA;VENTOLIN HFA) 108 (90 Base) MCG/ACT inhaler Inhale 1 puff into the lungs 2 (two) times daily. 05/20/17  Yes Mannam, Praveen, MD  aspirin 81 MG chewable tablet Chew 81 mg by mouth daily.   Yes [provider]  ferrous sulfate 325 (65 FE) MG tablet Take 1 tablet (325 mg total) by mouth 2 (two) times daily with a meal. 01/28/20  Yes Bensimhon, Shaune Pascal, MD  fluticasone (VERAMYST) 27.5 MCG/SPRAY nasal spray Place 1 spray into the nose daily as needed for rhinitis.    Yes [provider]  Fluticasone-Salmeterol,sensor, 113-14 MCG/ACT AEPB Inhale 1 puff into the lungs 2 (two) times daily.   Yes [provider]  levothyroxine (SYNTHROID,  LEVOTHROID) 112 MCG tablet Take 112 mcg by mouth daily before breakfast.   Yes [provider]  loratadine (CLARITIN) 10 MG tablet Take 10 mg by mouth daily as needed for allergies.    Yes [provider]  macitentan (OPSUMIT) 10 MG tablet Take 1 tablet (10 mg total) by mouth daily. 09/14/19  Yes Bensimhon, Shaune Pascal, MD  midodrine (PROAMATINE) 10 MG tablet Take 1 tablet (10 mg total) by mouth 3 (three) times daily. 03/03/20  Yes Bensimhon, Shaune Pascal, MD  montelukast (SINGULAIR) 10 MG tablet Take 10 mg by mouth at bedtime.   Yes [provider]  pantoprazole (PROTONIX) 20 MG tablet  Take 20 mg by mouth 2 (two) times daily.    Yes [provider]  potassium chloride 20 MEQ/15ML (10%) SOLN Take 15 mLs (20 mEq total) by mouth 2 (two) times daily. 12/23/19  Yes [name], MD  sildenafil (REVATIO) 20 MG tablet Take 2 tablets (40 mg total) by mouth 3 (three) times daily. 10/21/19  Yes Lyda Jester M, PA-C  simvastatin (ZOCOR) 10 MG tablet Take 10 mg by mouth at bedtime.   Yes [provider]  spironolactone (ALDACTONE) 25 MG tablet Take 0.5 tablets (12.5 mg total) by mouth daily. 12/24/19  Yes [name], MD  torsemide (DEMADEX) 20 MG tablet Take 2 tablets (40 mg total) by mouth daily. 03/03/20  Yes Bensimhon, Shaune Pascal, MD    Physical Exam: Constitutional: Moderately built and nourished. Vitals:   03/22/20 0012 03/22/20 0200 03/22/20 0239  BP: 123/68 97/61 102/68  Pulse: 74 85 (!) 35  Resp: 18 (!) 21 (!) 30  Temp: 97.6 F (36.4 C)    TempSrc: Oral    SpO2: 91% 95% 96%   Eyes: Anicteric no pallor. ENMT: No discharge from the ears eyes nose or mouth. Neck: No mass felt.  No neck rigidity. Respiratory: No rhonchi or crepitations. Cardiovascular: S1-S2 heard. Abdomen: Soft nontender bowel sounds present. Musculoskeletal: No edema. Skin: No rash. Neurologic: Alert awake oriented to time place and person.  Moves all extremities. Psychiatric: Appears normal per normal affect.   Labs on Admission: I have personally reviewed following labs and imaging studies  CBC: Recent Labs  Lab 03/22/20 0042  WBC 10.2  NEUTROABS 6.2  HGB 12.6*  HCT 38.6*  MCV 96.0  PLT 123456   Basic Metabolic Panel: Recent Labs  Lab 03/22/20 0042  NA 140  K 3.9  CL 102  CO2 28  GLUCOSE 112*  BUN 23  CREATININE 1.39*  CALCIUM 8.6*   GFR: CrCl cannot be calculated (Unknown ideal weight.). Liver Function Tests: No results for input(s): AST, ALT, ALKPHOS, BILITOT, PROT, ALBUMIN in the last 168 hours. No results for input(s): LIPASE, AMYLASE in the  last 168 hours. No results for input(s): AMMONIA in the last 168 hours. Coagulation Profile: No results for input(s): INR, PROTIME in the last 168 hours. Cardiac Enzymes: No results for input(s): CKTOTAL, CKMB, CKMBINDEX, TROPONINI in the last 168 hours. BNP (last 3 results) No results for input(s): PROBNP in the last 8760 hours. HbA1C: No results for input(s): HGBA1C in the last 72 hours. CBG: No results for input(s): GLUCAP in the last 168 hours. Lipid Profile: No results for input(s): CHOL, HDL, LDLCALC, TRIG, CHOLHDL, LDLDIRECT in the last 72 hours. Thyroid Function Tests: No results for input(s): TSH, T4TOTAL, FREET4, T3FREE, THYROIDAB in the last 72 hours. Anemia Panel: No results for input(s): VITAMINB12, FOLATE, FERRITIN, TIBC, IRON, RETICCTPCT in the last  72 hours. Urine analysis: No results found for: COLORURINE, APPEARANCEUR, LABSPEC, PHURINE, GLUCOSEU, HGBUR, BILIRUBINUR, KETONESUR, PROTEINUR, UROBILINOGEN, NITRITE, LEUKOCYTESUR Sepsis Labs: @LABRCNTIP (procalcitonin:4,lacticidven:4) )No results found for this or any previous visit (from the past 240 hour(s)).   Radiological Exams on Admission: No results found.   Assessment/Plan Principal Problem:   Rectal bleeding Active Problems:   COPD (chronic obstructive pulmonary disease) (HCC)   ILD (interstitial lung disease) (HCC)   Pulmonary hypertension, unspecified (HCC)   Aortic stenosis, moderate   S/P CABG (coronary artery bypass graft)   Acute GI bleeding    1. Rectal bleeding -suspect likely from diverticular bleeding given the previous history of diverticular bleed.  Will hold of aspirin will follow CBC serially.  For now and keep patient n.p.o. 2. History of severe pulmonary hypertension with diastolic dysfunction cor pulmonale on Opsumit and sildenafil.  I am holding off patient's diuretics for now given the rectal bleeding.  Closely monitor. 3. Chronic hypotension on midodrine. 4. Anemia likely from GI bleed  follow CBC. 5. History of CAD denies any chest pain.  Holding aspirin due to GI bleed. 6. COPD continue inhalers. 7. Hypothyroidism on Synthroid.   DVT prophylaxis: SCDs.  Avoiding pharmacological DVT due to GI bleed. Code Status: Partial code no intubation. Family Communication: Discussed with patient. Disposition Plan: Home. Consults called: None. Admission status: Observation.   Rise Patience MD [facility] Pager [phone].  If 7PM-7AM, please contact night-coverage [email or web address] Password [facility]  03/22/2020, 3:46 AM

## 2020-03-22 NOTE — Consult Note (Addendum)
Referring Provider: Dr. Horris Latino, [facility] Primary Care Physician:  Katherina Mires, MD Primary Gastroenterologist:  Althia Forts  Reason for Consultation:  GI bleed  HPI: Vincent Robinson is a 86 y.o. male with history of severe pulmonary hypertension with cor pulmonale and chronic diastolic CHF, persistent A. Fib, CAD only on ASA 81 mg daily due to history of GI bleeding.  Has history of diverticular bleed on several occasions last in 06/2019.  He presented to [facility] long ED overnight with complaints rectal bleeding.  He says it began around 10:00 PM when he was getting ready to go to bed.  Had 2 episodes before presenting to the ED.  He is now had 2 episodes in the ED, the last about 30 minutes ago.  Describes this as being red/maroon-colored blood.  Exact same scenario as previous diverticular bleeds.  Hemoglobin stable at lacerating at 11.7 g.  Denies abdominal pain.  GI history as follows:   Hematochezia with hemodynamic shock and iron deficiency anemia requiring 4 PRBCs, pressors in 05/2017.  Nuc med bleeding scan at the time negative attributed to diverticulosis.  Diverticular bleed around 2012 when he lived in [place].  Bleeding ulcer in 2016. 05/2017 colonoscopy.  To cecum. Showed diverticulosis in transverse and descending colon.  Blood in the transverse, descending, sigmoid, rectum without clear site of bleeding.  No evaluation during 06/2019 hospitalization, resolved without intervention.   Past Medical History:  Diagnosis Date  . AAA (abdominal aortic aneurysm) ([place])   . Acute respiratory failure ([place]) 11/23/2019  . Asthma   . Basal cell carcinoma   . Chronic respiratory failure ([place])   . COPD (chronic obstructive pulmonary disease) ([place])   . Coronary artery disease   . History of chronic respiratory failure   . Hyperlipidemia   . Hypothyroidism   . Right heart failure ([place])   . Skin cancer 2017  . Sleep apnea     Past Surgical History:  Procedure Laterality Date  . bleeding  ulcer  2016  . BYPASS AXILLA/BRACHIAL ARTERY  1998  . colon bleed  2012  . COLONOSCOPY N/A 06/07/2017   Procedure: COLONOSCOPY;  Surgeon: Carol Ada, MD;  Location: [facility];  Service: Endoscopy;  Laterality: N/A;  . DEBRIDMENT OF DECUBITUS ULCER  2002  . RIGHT HEART CATH N/A 12/11/2017   Procedure: RIGHT HEART CATH;  Surgeon: Jolaine Artist, MD;  Location: [place] CV LAB;  Service: Cardiovascular;  Laterality: N/A;  . THORACIC AORTA STENT  2009    Prior to Admission medications   Medication Sig Start Date End Date Taking? Authorizing Provider  albuterol (PROVENTIL HFA;VENTOLIN HFA) 108 (90 Base) MCG/ACT inhaler Inhale 1 puff into the lungs 2 (two) times daily. 05/20/17  Yes Mannam, Praveen, MD  aspirin 81 MG chewable tablet Chew 81 mg by mouth daily.   Yes [provider]  ferrous sulfate 325 (65 FE) MG tablet Take 1 tablet (325 mg total) by mouth 2 (two) times daily with a meal. 01/28/20  Yes Bensimhon, Shaune Pascal, MD  fluticasone (VERAMYST) 27.5 MCG/SPRAY nasal spray Place 1 spray into the nose daily as needed for rhinitis.    Yes [provider]  Fluticasone-Salmeterol,sensor, 113-14 MCG/ACT AEPB Inhale 1 puff into the lungs 2 (two) times daily.   Yes [provider]  levothyroxine (SYNTHROID, LEVOTHROID) 112 MCG tablet Take 112 mcg by mouth daily before breakfast.   Yes [provider]  loratadine (CLARITIN) 10 MG tablet Take 10 mg by mouth daily as needed for  allergies.    Yes [provider]  macitentan (OPSUMIT) 10 MG tablet Take 1 tablet (10 mg total) by mouth daily. 09/14/19  Yes Bensimhon, Shaune Pascal, MD  midodrine (PROAMATINE) 10 MG tablet Take 1 tablet (10 mg total) by mouth 3 (three) times daily. 03/03/20  Yes Bensimhon, Shaune Pascal, MD  montelukast (SINGULAIR) 10 MG tablet Take 10 mg by mouth at bedtime.   Yes [provider]  pantoprazole (PROTONIX) 20 MG tablet Take 20 mg by mouth 2 (two) times daily.    Yes [provider]  potassium chloride 20 MEQ/15ML (10%) SOLN Take 15 mLs (20 mEq total) by mouth 2 (two) times daily. 12/23/19  Yes [name], MD  sildenafil (REVATIO) 20 MG tablet Take 2 tablets (40 mg total) by mouth 3 (three) times daily. 10/21/19  Yes Lyda Jester M, PA-C  simvastatin (ZOCOR) 10 MG tablet Take 10 mg by mouth at bedtime.   Yes [provider]  spironolactone (ALDACTONE) 25 MG tablet Take 0.5 tablets (12.5 mg total) by mouth daily. 12/24/19  Yes [name], MD  torsemide (DEMADEX) 20 MG tablet Take 2 tablets (40 mg total) by mouth daily. 03/03/20  Yes Bensimhon, Shaune Pascal, MD    Current Facility-Administered Medications  Medication Dose Route Frequency Provider Last Rate Last Admin  . albuterol (PROVENTIL) (2.5 MG/3ML) 0.083% nebulizer solution 2.5 mg  2.5 mg Nebulization BID Alma Friendly, MD      . ferrous sulfate tablet 325 mg  325 mg Oral BID WC Rise Patience, MD   325 mg at 03/22/20 0926  . levothyroxine (SYNTHROID) tablet 112 mcg  112 mcg Oral Q0600 Rise Patience, MD   112 mcg at 03/22/20 [id number]  . macitentan (OPSUMIT) tablet 10 mg  10 mg Oral Daily Rise Patience, MD   10 mg at 03/22/20 0954  . midodrine (PROAMATINE) tablet 10 mg  10 mg Oral TID with meals Rise Patience, MD   10 mg at 03/22/20 1145  . mometasone-formoterol (DULERA) 100-5 MCG/ACT inhaler 2 puff  2 puff Inhalation BID Alma Friendly, MD      . ondansetron [facility]) tablet 4 mg  4 mg Oral Q6H PRN Rise Patience, MD       Or  . ondansetron [facility]) injection 4 mg  4 mg Intravenous Q6H PRN Rise Patience, MD      . pantoprazole (PROTONIX) EC tablet 20 mg  20 mg Oral BID Rise Patience, MD   20 mg at 03/22/20 0926  . sildenafil (REVATIO) tablet 40 mg  40 mg Oral TID Rise Patience, MD   40 mg at 03/22/20 0954  . simvastatin (ZOCOR) tablet 10 mg  10 mg Oral QHS Rise Patience, MD       Current Outpatient Medications    Medication Sig Dispense Refill  . albuterol (PROVENTIL HFA;VENTOLIN HFA) 108 (90 Base) MCG/ACT inhaler Inhale 1 puff into the lungs 2 (two) times daily. 1 Inhaler 2  . aspirin 81 MG chewable tablet Chew 81 mg by mouth daily.    . ferrous sulfate 325 (65 FE) MG tablet Take 1 tablet (325 mg total) by mouth 2 (two) times daily with a meal. 60 tablet 3  . fluticasone (VERAMYST) 27.5 MCG/SPRAY nasal spray Place 1 spray into the nose daily as needed for rhinitis.     . Fluticasone-Salmeterol,sensor, 113-14 MCG/ACT AEPB Inhale 1 puff into the lungs 2 (two) times daily.    Marland Kitchen  levothyroxine (SYNTHROID, LEVOTHROID) 112 MCG tablet Take 112 mcg by mouth daily before breakfast.    . loratadine (CLARITIN) 10 MG tablet Take 10 mg by mouth daily as needed for allergies.     . macitentan (OPSUMIT) 10 MG tablet Take 1 tablet (10 mg total) by mouth daily. 30 tablet 0  . midodrine (PROAMATINE) 10 MG tablet Take 1 tablet (10 mg total) by mouth 3 (three) times daily. 90 tablet 6  . montelukast (SINGULAIR) 10 MG tablet Take 10 mg by mouth at bedtime.    . pantoprazole (PROTONIX) 20 MG tablet Take 20 mg by mouth 2 (two) times daily.     . potassium chloride 20 MEQ/15ML (10%) SOLN Take 15 mLs (20 mEq total) by mouth 2 (two) times daily. 900 mL 1  . sildenafil (REVATIO) 20 MG tablet Take 2 tablets (40 mg total) by mouth 3 (three) times daily. 540 tablet 3  . simvastatin (ZOCOR) 10 MG tablet Take 10 mg by mouth at bedtime.    Marland Kitchen spironolactone (ALDACTONE) 25 MG tablet Take 0.5 tablets (12.5 mg total) by mouth daily. 30 tablet 1  . torsemide (DEMADEX) 20 MG tablet Take 2 tablets (40 mg total) by mouth daily. 60 tablet 6    Allergies as of 03/21/2020  . (No Known Allergies)    Family History  Problem Relation Age of Onset  . Emphysema Mother   . Stroke Father   . Movement disorder Sister   . Aneurysm Brother        Thoracic aorta  . Macular degeneration Brother     Social History   Socioeconomic History  .  Marital status: Married    Spouse name: Not on file  . Number of children: Not on file  . Years of education: Not on file  . Highest education level: Not on file  Occupational History  . Not on file  Tobacco Use  . Smoking status: Former Smoker    Packs/day: 0.50    Quit date: 08/06/1961    Years since quitting: 58.6  . Smokeless tobacco: Never Used  Substance and Sexual Activity  . Alcohol use: Not Currently  . Drug use: Never  . Sexual activity: Not on file  Other Topics Concern  . Not on file  Social History Narrative  . Not on file   Social Determinants of Health   Financial Resource Strain:   . Difficulty of Paying Living Expenses:   Food Insecurity:   . Worried About Charity fundraiser in the Last Year:   . Arboriculturist in the Last Year:   Transportation Needs:   . Film/video editor (Medical):   Marland Kitchen Lack of Transportation (Non-Medical):   Physical Activity:   . Days of Exercise per Week:   . Minutes of Exercise per Session:   Stress:   . Feeling of Stress :   Social Connections:   . Frequency of Communication with Friends and Family:   . Frequency of Social Gatherings with Friends and Family:   . Attends Religious Services:   . Active Member of Clubs or Organizations:   . Attends Archivist Meetings:   Marland Kitchen Marital Status:   Intimate Partner Violence:   . Fear of Current or Ex-Partner:   . Emotionally Abused:   Marland Kitchen Physically Abused:   . Sexually Abused:     Review of Systems: ROS is O/W negative except as mentioned in HPI.  Physical Exam: Vital signs in last 24 hours: Temp:  [  97.6 F (36.4 C)] 97.6 F (36.4 C) (06/01 0012) Pulse Rate:  [35-95] 81 (06/01 1241) Resp:  [13-30] 20 (06/01 1241) BP: (90-123)/(49-68) 90/61 (06/01 1241) SpO2:  [91 %-100 %] 96 % (06/01 1241)   General:  Alert, Well-developed, well-nourished, pleasant and cooperative in NAD Head:  Normocephalic and atraumatic. Eyes:  Sclera clear, no icterus.   Conjunctiva  pink. Ears:  Normal auditory acuity. Mouth:  No deformity or lesions.   Lungs:  Clear throughout to auscultation.  No wheezes, crackles, or rhonchi.  Heart:  Irregularly irregular. Abdomen:  Soft, non-distended.  BS present and hyperactive.  Non-tender. Rectal:  Deferred.  "Gross blood" noted per EDP.    Msk:  Symmetrical without gross deformities. Pulses:  Normal pulses noted. Extremities:  Without clubbing or edema. Neurologic:  Alert and oriented x 4;  grossly normal neurologically. Skin:  Skin is thin with multiple small areas of bruising. Psych:  Alert and cooperative. Normal mood and affect.  Lab Results: Recent Labs    03/22/20 0042 03/22/20 0426 03/22/20 0757  WBC 10.2 11.3* 10.7*  HGB 12.6* 11.1* 11.0*  HCT 38.6* 33.9* 33.3*  PLT 315 272 276   BMET Recent Labs    03/22/20 0042 03/22/20 0426  NA 140 139  K 3.9 3.9  CL 102 105  CO2 28 25  GLUCOSE 112* 107*  BUN 23 23  CREATININE 1.39* 1.24  CALCIUM 8.6* 8.0*   IMPRESSION:  *GI bleeding: Presumed diverticular from colonic diverticulosis noted on Dr. Benson Norway colonoscopy in 2018.  Has had multiple episodes of the same, last 06/2019.  Says that this is the exact same bleeding that he has had on other occasions when it was determined to be diverticular. *History of bleeding ulcer in 2016 while on full dose aspirin. *Pulmonary hypertension, interstitial lung disease, COPD:  On oxygen, sildenafil. *Persistent afib, CAD:  Not on anticoagulation due to history of GI bleeding.  PLAN: -Will check CTA as patient just had another rather large bloody BM about 30 minutes ago.  If positive then will need IR for possible embolization. -Monitor Hgb and transfuse if needed.  Laban Emperor. Zehr  03/22/2020, 12:55 PM    Attending physician's note   I have taken a history, examined the patient and reviewed the chart. I agree with the Advanced Practitioner's note, impression and recommendations.  86 year old male with history of  severe pulmonary hypertension, A. fib, CAD, CHF presented with hematochezia.  History of recurrent admission with hematochezia thought to be secondary to diverticular hemorrhage.  Colonoscopy 2018 for evaluation of hematochezia noted left-sided diverticulosis, was unable to identify the source of bleed.  CTA positive for active extravasation in left colon, IR consulted for embolization  Monitor hemoglobin and transfuse as needed Supportive care  GI available if have any questions K. Denzil Magnuson , MD [phone]

## 2020-03-22 NOTE — ED Triage Notes (Signed)
PER EMS: Patient is coming from home with c/o rectal bleeding. States it started about two hours ago. Patient states he gets a sudden urge to use the restroom and had a sudden onset of rectal bleeding that "floods the toliet bowl". Blood is dark in coloration. Pt is currently taking iron supplements. No abdominal pain. Denies N/V. A&Ox4 and ambulatory. hx COPD wears 2L oxygen at baseline  EMS VITALS: BP 126/72 HR 92 SPO2 88% 2L via nasal cannula  RR 18 TEMP 97.5

## 2020-03-22 NOTE — ED Notes (Signed)
Attempted report call 

## 2020-03-22 NOTE — ED Provider Notes (Signed)
[facility] DEPT Provider Note   CSN: [id number] Arrival date & time: 03/21/20  2359     History No chief complaint on file.   Vincent Robinson is a 86 y.o. male.  HPI     This is a 86 year old male with a history of AAA, COPD, coronary artery disease who presents with rectal bleeding.  Patient reports that he had been feeling well.  This evening he went to the bathroom and had 2 episodes of large-volume dark bloody stools into the toilet.  He states that it filled the toilet.  He states that it was mostly blood and very limited stool.  History of the same and "they could not figure out what was wrong with me."  Denies abdominal pain or fevers.  Denies chest pain or shortness of breath.  He is not on any anticoagulants.  He lives at home with his wife.  Denies dizziness.  Patient does take iron daily.  Chart reviewed.  Patient last admitted in September 2020 for similar episode.  Presumed diverticular bleed.  At that time dropped his hemoglobin to 10.6.  Had no intervention or procedures at that time.  He does take daily iron.  Past Medical History:  Diagnosis Date  . AAA (abdominal aortic aneurysm) ([place])   . Acute respiratory failure ([place]) 11/23/2019  . Asthma   . Basal cell carcinoma   . Chronic respiratory failure ([place])   . COPD (chronic obstructive pulmonary disease) ([place])   . Coronary artery disease   . History of chronic respiratory failure   . Hyperlipidemia   . Hypothyroidism   . Right heart failure ([place])   . Skin cancer 2017  . Sleep apnea     Patient Active Problem List   Diagnosis Date Noted  . Hypokalemia   . Hypotension   . Acute respiratory failure with hypoxia ([place]) 12/18/2019  . Acute on chronic right-sided heart failure ([place]) 12/18/2019  . Pressure injury of skin 11/24/2019  . Chronic respiratory failure ([place])   . Acute on chronic respiratory failure ([place]) 11/23/2019  . Right heart failure ([place])   . S/P CABG (coronary artery  bypass graft) 11/22/2019  . Chest pain 11/22/2019  . Atrial fibrillation ([place]) 08/04/2019  . Pulmonary hypertension, unspecified ([place]) 10/11/2017  . Aortic stenosis, moderate 10/11/2017  . Coronary artery disease 08/30/2017  . AAA (abdominal aortic aneurysm) without rupture ([place]) 08/30/2017  . Acute blood loss anemia 06/07/2017  . Hemorrhagic shock ([place]) 06/07/2017  . Lower GI bleed 06/05/2017  . Sleep apnea 06/05/2017  . Hypothyroidism 06/05/2017  . Hyperlipidemia 06/05/2017  . Asthma 06/05/2017  . ILD (interstitial lung disease) ([place]) 05/28/2017  . Pneumothorax on right 05/20/2017  . COPD (chronic obstructive pulmonary disease) ([place]) 05/20/2017  . Pneumothorax 05/20/2017  . Skin cancer 10/23/2015    Past Surgical History:  Procedure Laterality Date  . bleeding ulcer  2016  . BYPASS AXILLA/BRACHIAL ARTERY  1998  . colon bleed  2012  . COLONOSCOPY N/A 06/07/2017   Procedure: COLONOSCOPY;  Surgeon: Carol Ada, MD;  [facility];  Service: Endoscopy;  Laterality: N/A;  . DEBRIDMENT OF DECUBITUS ULCER  2002  . RIGHT HEART CATH N/A 12/11/2017   Procedure: RIGHT HEART CATH;  Surgeon: Jolaine Artist, MD;  Location: [place] CV LAB;  Service: Cardiovascular;  Laterality: N/A;  . THORACIC AORTA STENT  2009       Family History  Problem Relation Age of Onset  . Emphysema Mother   .  Stroke Father   . Movement disorder Sister   . Aneurysm Brother        Thoracic aorta  . Macular degeneration Brother     Social History   Tobacco Use  . Smoking status: Former Smoker    Packs/day: 0.50    Quit date: 08/06/1961    Years since quitting: 58.6  . Smokeless tobacco: Never Used  Substance Use Topics  . Alcohol use: Not Currently  . Drug use: Never    Home Medications Prior to Admission medications   Medication Sig Start Date End Date Taking? Authorizing Provider  albuterol (PROVENTIL HFA;VENTOLIN HFA) 108 (90 Base) MCG/ACT inhaler Inhale 1 puff into the  lungs 2 (two) times daily. 05/20/17   Marshell Garfinkel, MD  aspirin 81 MG chewable tablet Chew 81 mg by mouth daily.    [provider]  ferrous sulfate 325 (65 FE) MG tablet Take 1 tablet (325 mg total) by mouth 2 (two) times daily with a meal. 01/28/20   Bensimhon, Shaune Pascal, MD  fluticasone (VERAMYST) 27.5 MCG/SPRAY nasal spray Place 1 spray into the nose daily as needed for rhinitis.     [provider]  Fluticasone-Salmeterol,sensor, 113-14 MCG/ACT AEPB Inhale 1 puff into the lungs 2 (two) times daily.    [provider]  levothyroxine (SYNTHROID, LEVOTHROID) 112 MCG tablet Take 112 mcg by mouth daily before breakfast.    [provider]  loratadine (CLARITIN) 10 MG tablet Take 10 mg by mouth daily as needed for allergies.     [provider]  macitentan (OPSUMIT) 10 MG tablet Take 1 tablet (10 mg total) by mouth daily. 09/14/19   Bensimhon, Shaune Pascal, MD  midodrine (PROAMATINE) 10 MG tablet Take 1 tablet (10 mg total) by mouth 3 (three) times daily. 03/03/20   Bensimhon, Shaune Pascal, MD  montelukast (SINGULAIR) 10 MG tablet Take 10 mg by mouth at bedtime.    [provider]  pantoprazole (PROTONIX) 20 MG tablet Take 20 mg by mouth daily.    [provider]  potassium chloride 20 MEQ/15ML (10%) SOLN Take 15 mLs (20 mEq total) by mouth 2 (two) times daily. 12/23/19   [name], MD  sildenafil (REVATIO) 20 MG tablet Take 2 tablets (40 mg total) by mouth 3 (three) times daily. 10/21/19   Lyda Jester M, PA-C  simvastatin (ZOCOR) 10 MG tablet Take 10 mg by mouth at bedtime.    [provider]  spironolactone (ALDACTONE) 25 MG tablet Take 0.5 tablets (12.5 mg total) by mouth daily. 12/24/19   [name], MD  torsemide (DEMADEX) 20 MG tablet Take 2 tablets (40 mg total) by mouth daily. 03/03/20   Bensimhon, Shaune Pascal, MD    Allergies    Patient has no known allergies.  Review of Systems   Review of Systems    Constitutional: Negative for fever.  Respiratory: Negative for shortness of breath.   Gastrointestinal: Positive for blood in stool. Negative for abdominal pain, constipation, diarrhea, nausea and vomiting.  Genitourinary: Negative for dysuria.  Neurological: Negative for dizziness.  All other systems reviewed and are negative.   Physical Exam Updated Vital Signs BP 123/68 (BP Location: Left Arm)   Pulse 74   Temp 97.6 F (36.4 C) (Oral)   Resp 18   SpO2 91%   Physical Exam Vitals and nursing note reviewed.  Constitutional:      Appearance: He is well-developed.     Comments: Elderly, chronically ill-appearing but nontoxic  HENT:  Head: Normocephalic and atraumatic.     Nose: Nose normal.     Mouth/Throat:     Mouth: Mucous membranes are moist.  Eyes:     Pupils: Pupils are equal, round, and reactive to light.  Cardiovascular:     Rate and Rhythm: Normal rate and regular rhythm.     Heart sounds: Normal heart sounds. No murmur.  Pulmonary:     Effort: Pulmonary effort is normal. No respiratory distress.     Breath sounds: Wheezing present.     Comments: Occasional wheeze Abdominal:     General: Bowel sounds are normal.     Palpations: Abdomen is soft.     Tenderness: There is no abdominal tenderness. There is no rebound.  Genitourinary:    Comments: Gross blood noted on rectal exam Musculoskeletal:     Cervical back: Neck supple.     Right lower leg: No edema.     Left lower leg: No edema.  Lymphadenopathy:     Cervical: No cervical adenopathy.  Skin:    General: Skin is warm and dry.  Neurological:     Mental Status: He is alert and oriented to person, place, and time.  Psychiatric:        Mood and Affect: Mood normal.     ED Results / Procedures / Treatments   Labs (all labs ordered are listed, but only abnormal results are displayed) Labs Reviewed  CBC WITH DIFFERENTIAL/PLATELET - Abnormal; Notable for the following components:      Result Value    RBC 4.02 (*)    Hemoglobin 12.6 (*)    HCT 38.6 (*)    RDW 17.5 (*)    Monocytes Absolute 1.1 (*)    All other components within normal limits  BASIC METABOLIC PANEL - Abnormal; Notable for the following components:   Glucose, Bld 112 (*)    Creatinine, Ser 1.39 (*)    Calcium 8.6 (*)    GFR calc non Af Amer 43 (*)    GFR calc Af Amer 50 (*)    All other components within normal limits  SARS CORONAVIRUS 2 BY RT PCR (HOSPITAL ORDER, [place] [facility])  TYPE AND SCREEN    EKG None  Radiology No results found.  Procedures Procedures (including critical care time)  Medications Ordered in ED Medications - No data to display  ED Course  I have reviewed the triage vital signs and the nursing notes.  Pertinent labs & imaging results that were available during my care of the patient were reviewed by me and considered in my medical decision making (see chart for details).    MDM Rules/Calculators/A&P                       Patient presents with bright red blood per rectum.  He is overall nontoxic and vital signs are reassuring.  He is hemodynamically stable.  On exam he has gross blood on rectal exam.  His abdomen is otherwise benign.  Suspect likely recurrent lower GI bleed.  Given benign exam, diverticular bleed would be most common.  Lab work obtained.  Hemoglobin is 12.6.  However, this does not likely reflect most recent bleeding.  No significant metabolic derangements noted.  Given evidence of active bleeding on exam, feel he would be reasonable for him to be admitted for observation for serial hemoglobins and GI evaluation if needed.  Covid testing pending.  From a respiratory standpoint, patient is on his  chronic 2 L of oxygen without respiratory distress.  Final Clinical Impression(s) / ED Diagnoses Final diagnoses:  Lower GI bleed    Rx / DC Orders ED Discharge Orders    None       Kannen Moxey, Barbette Hair, MD 03/22/20 [phone]

## 2020-03-22 NOTE — ED Notes (Signed)
Called to check on status of 1512- 5E reports room will be done in just a few minutes

## 2020-03-22 NOTE — Consult Note (Signed)
Chief Complaint: Patient was seen in consultation today for mesenteric/visceral arteriogram with possible embolization  Referring Physician(s): Ezenduka,N  Supervising Physician: Jacqulynn Cadet  Patient Status: [facility] - ED  History of Present Illness: Vincent Robinson is a 86 y.o. male with past medical history significant for AAA with stent graft, asthma, basal cell carcinoma, COPD, coronary artery disease, pulmonary hypertension with cor pulmonale/chronic diastolic CHF, atrial fibrillation and prior diverticular bleed last in September 2020.  He presented to the [facility] ED yesterday evening with rectal bleeding which began around 10 PM.  The only anticoagulant he is on presently is aspirin.  He was evaluated by GI service and CT angiogram abdomen and pelvis was ordered which revealed:  VASCULAR  1. Positive for active GI bleeding in the left colon. 2. Endovascular repair of an abdominal aortic aneurysm. The aortic stent graft is patent. 3. Aortic aneurysm sac has slightly enlarged since 2018 and there is a type 2 endoleak on the delayed images.  NON-VASCULAR  1. Enhancing lesion along the right side of the bladder. 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 Recommend urology consultation. 2. Colonic diverticulosis. 3. Question some fluid within the inguinal canals  Current labs include WBC 10.7, hemoglobin 11, platelets 276k, creatinine 1.24, COVID-19 negative  Request now received for visceral/mesenteric arteriogram with possible embolization   Past Medical History:  Diagnosis Date  . AAA (abdominal aortic aneurysm) ([place])   . Acute respiratory failure ([place]) 11/23/2019  . Asthma   . Basal cell carcinoma   . Chronic respiratory failure ([place])   . COPD (chronic obstructive pulmonary disease) ([place])   . Coronary artery disease   . History of chronic respiratory failure   . Hyperlipidemia   . Hypothyroidism   . Right heart failure ([place])   . Skin  cancer 2017  . Sleep apnea     Past Surgical History:  Procedure Laterality Date  . bleeding ulcer  2016  . BYPASS AXILLA/BRACHIAL ARTERY  1998  . colon bleed  2012  . COLONOSCOPY N/A 06/07/2017   Procedure: COLONOSCOPY;  Surgeon: Carol Ada, MD;  Location: [facility];  Service: Endoscopy;  Laterality: N/A;  . DEBRIDMENT OF DECUBITUS ULCER  2002  . RIGHT HEART CATH N/A 12/11/2017   Procedure: RIGHT HEART CATH;  Surgeon: Jolaine Artist, MD;  Location: [facility];  Service: Cardiovascular;  Laterality: N/A;  . THORACIC AORTA STENT  2009    Allergies: Patient has no known allergies.  Medications: Prior to Admission medications   Medication Sig Start Date End Date Taking? Authorizing Provider  albuterol (PROVENTIL HFA;VENTOLIN HFA) 108 (90 Base) MCG/ACT inhaler Inhale 1 puff into the lungs 2 (two) times daily. 05/20/17  Yes Mannam, Praveen, MD  aspirin 81 MG chewable tablet Chew 81 mg by mouth daily.   Yes [provider]  ferrous sulfate 325 (65 FE) MG tablet Take 1 tablet (325 mg total) by mouth 2 (two) times daily with a meal. 01/28/20  Yes Bensimhon, Shaune Pascal, MD  fluticasone (VERAMYST) 27.5 MCG/SPRAY nasal spray Place 1 spray into the nose daily as needed for rhinitis.    Yes [provider]  Fluticasone-Salmeterol,sensor, 113-14 MCG/ACT AEPB Inhale 1 puff into the lungs 2 (two) times daily.   Yes [provider]  levothyroxine (SYNTHROID, LEVOTHROID) 112 MCG tablet Take 112 mcg by mouth daily before breakfast.   Yes [provider]  loratadine (CLARITIN) 10 MG tablet Take 10 mg by mouth daily as needed for allergies.  Yes [provider]  macitentan (OPSUMIT) 10 MG tablet Take 1 tablet (10 mg total) by mouth daily. 09/14/19  Yes Bensimhon, Shaune Pascal, MD  midodrine (PROAMATINE) 10 MG tablet Take 1 tablet (10 mg total) by mouth 3 (three) times daily. 03/03/20  Yes Bensimhon, Shaune Pascal, MD  montelukast (SINGULAIR) 10 MG tablet  Take 10 mg by mouth at bedtime.   Yes [provider]  pantoprazole (PROTONIX) 20 MG tablet Take 20 mg by mouth 2 (two) times daily.    Yes [provider]  potassium chloride 20 MEQ/15ML (10%) SOLN Take 15 mLs (20 mEq total) by mouth 2 (two) times daily. 12/23/19  Yes [name], MD  sildenafil (REVATIO) 20 MG tablet Take 2 tablets (40 mg total) by mouth 3 (three) times daily. 10/21/19  Yes Lyda Jester M, PA-C  simvastatin (ZOCOR) 10 MG tablet Take 10 mg by mouth at bedtime.   Yes [provider]  spironolactone (ALDACTONE) 25 MG tablet Take 0.5 tablets (12.5 mg total) by mouth daily. 12/24/19  Yes [name], MD  torsemide (DEMADEX) 20 MG tablet Take 2 tablets (40 mg total) by mouth daily. 03/03/20  Yes Bensimhon, Shaune Pascal, MD     Family History  Problem Relation Age of Onset  . Emphysema Mother   . Stroke Father   . Movement disorder Sister   . Aneurysm Brother        Thoracic aorta  . Macular degeneration Brother     Social History   Socioeconomic History  . Marital status: Married    Spouse name: Not on file  . Number of children: Not on file  . Years of education: Not on file  . Highest education level: Not on file  Occupational History  . Not on file  Tobacco Use  . Smoking status: Former Smoker    Packs/day: 0.50    Quit date: 08/06/1961    Years since quitting: 58.6  . Smokeless tobacco: Never Used  Substance and Sexual Activity  . Alcohol use: Not Currently  . Drug use: Never  . Sexual activity: Not on file  Other Topics Concern  . Not on file  Social History Narrative  . Not on file   Social Determinants of Health   Financial Resource Strain:   . Difficulty of Paying Living Expenses:   Food Insecurity:   . Worried About Charity fundraiser in the Last Year:   . Arboriculturist in the Last Year:   Transportation Needs:   . Film/video editor (Medical):   Marland Kitchen Lack of Transportation (Non-Medical):   Physical  Activity:   . Days of Exercise per Week:   . Minutes of Exercise per Session:   Stress:   . Feeling of Stress :   Social Connections:   . Frequency of Communication with Friends and Family:   . Frequency of Social Gatherings with Friends and Family:   . Attends Religious Services:   . Active Member of Clubs or Organizations:   . Attends Archivist Meetings:   Marland Kitchen Marital Status:       Review of Systems currently denies fever, headache, chest pain, dyspnea, cough, abdominal/back pain, nausea, vomiting  Vital Signs: BP 100/61   Pulse 80   Temp 97.6 F (36.4 C) (Oral)   Resp 18   SpO2 100%   Physical Exam awake, alert.  Chest clear to auscultation bilaterally.  Heart with normal rate, irreg rhythm.  Abdomen soft, positive  bowel sounds, nontender.  No lower extremity edema.  Imaging: CT Angio Abd/Pel w/ and/or w/o  Result Date: 03/22/2020 CLINICAL DATA:  86 year old with rectal bleeding. EXAM: CT ANGIOGRAPHY ABDOMEN AND PELVIS WITH CONTRAST AND WITHOUT CONTRAST TECHNIQUE: Multidetector CT imaging of the abdomen and pelvis was performed using the standard protocol during bolus administration of intravenous contrast. Multiplanar reconstructed images and MIPs were obtained and reviewed to evaluate the vascular anatomy. CONTRAST:  106mL OMNIPAQUE IOHEXOL 350 MG/ML SOLN COMPARISON:  CT abdomen and pelvis 06/05/2017 FINDINGS: VASCULAR Aorta: Patient has undergone endovascular repair of an abdominal aortic aneurysm with bifurcated aortic stent graft. The aorta and stent graft are patent. The native aortic aneurysm sac measures up to 4.8 cm and measured roughly 4.6 cm in 2018. There is evidence for an endoleak on the venous phase of imaging. There is contrast within the aneurysm sac posterior to the graft on sequence 13, image 47. 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 Celiac: Celiac trunk is patent without significant stenosis. SMA:  Minimal plaque at the origin of the SMA without significant stenosis. Evidence for active contrast extravasation within the descending colon on sequence 6, image 69. There is accumulation of contrast in the same area on the delayed images on sequence 13, image 27. Findings are compatible with active GI bleeding. Renals: Bilateral renal arteries are patent without significant stenosis. Small accessory left renal artery. Aortic stent graft is positioned below the renal arteries. IMA: Embolization coils at the origin of the IMA likely related to previous type 2 leak treatment. Reconstitution of the distal IMA branches. Inflow: Bifurcated aortic stent graft extends into the common iliac arteries bilaterally. Both limbs are widely patent. There is flow in the internal and external iliac arteries bilaterally. Proximal Outflow: Proximal femoral arteries are patent bilaterally. Veins: Patent veins are patent. Main portal venous system is patent. Renal veins are patent. No gross abnormality to the iliac veins. Review of the MIP images confirms the above findings. NON-VASCULAR Lower chest: Prominent interstitial lung markings bilaterally suggestive for chronic changes and areas of scarring and fibrosis. No pleural effusions. Hepatobiliary: Mild dilatation of left hepatic bile ducts is unchanged. There may be some dilated peripheral bile ducts in the right hepatic lobe which are chronic. No acute abnormality to the liver or gallbladder. Pancreas: Mild dilatation of the main pancreatic duct is chronic. No evidence for pancreatic inflammation. Spleen: Spleen is small for size and stable. Adrenals/Urinary Tract: Normal appearance of the adrenal glands. No hydronephrosis. No suspicious renal lesions. There is evidence for a diverticulum involving the right posterior aspect of the urinary bladder. There is high-density material near the diverticulum that is suspicious for an enhancing lesion measuring roughly 2.3 cm. Stomach/Bowel:  Rectum is mildly distended with gas and fluid. Extensive diverticulosis in the descending colon and proximal sigmoid colon region. Evidence for active GI bleeding involving the descending colon along the left lateral abdomen. No other area suspicious for active GI bleeding. Normal appearance of the stomach. Lymphatic: No abdominal or pelvic lymphadenopathy. Reproductive: Prostate is unremarkable. Other: Negative for free fluid. There is probably some fluid within the lower inguinal canals. Musculoskeletal: No acute bone abnormality. Mild anterolisthesis at L4-L5. IMPRESSION: VASCULAR 1. Positive for active GI bleeding in the left colon. 2. Endovascular repair of an abdominal aortic aneurysm. The aortic stent graft is patent. 3. Aortic aneurysm sac has slightly enlarged since 2018 and there is a type 2 endoleak on the delayed images.  NON-VASCULAR 1. Enhancing lesion along the right side of the bladder. 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 Recommend urology consultation. 2. Colonic diverticulosis. 3. Question some fluid within the inguinal canals. These results were called by telephone at the time of interpretation on 03/22/2020 at 3:19 pm to provider Dr. Horris Latino, who verbally acknowledged these results. Electronically Signed   By: Markus Daft M.D.   On: 03/22/2020 15:22    Labs:  CBC: Recent Labs    03/22/20 0042 03/22/20 0426 03/22/20 0757 03/22/20 1244  WBC 10.2 11.3* 10.7* 12.4*  HGB 12.6* 11.1* 11.0* 11.7*  HCT 38.6* 33.9* 33.3* 35.7*  PLT 315 272 276 340    COAGS: Recent Labs    07/11/19 2349  INR 1.1  APTT 36    BMP: Recent Labs    12/30/19 1015 02/29/20 1524 03/22/20 0042 03/22/20 0426  NA 136 139 140 139  K 4.1 3.6 3.9 3.9  CL 99 101 102 105  CO2 26 25 28 25   GLUCOSE 98 83 112* 107*  BUN 17 24* 23 23  CALCIUM 8.9 8.8* 8.6* 8.0*  CREATININE 1.23 1.11 1.39* 1.24  GFRNONAA 50* 57* 43* 49*  GFRAA 58* >60 50* 57*    LIVER FUNCTION TESTS: Recent Labs     07/11/19 2349 07/11/19 2349 07/12/19 0346 07/12/19 0346 12/18/19 0135 12/19/19 0213 12/20/19 0317 12/21/19 0316 12/22/19 0502 12/23/19 0746  BILITOT 0.7  --  0.5  --  0.6  --   --   --   --   --   AST 29  --  24  --  19  --   --   --   --   --   ALT 14  --  15  --  11  --   --   --   --   --   ALKPHOS 103  --  88  --  67  --   --   --   --   --   PROT 7.7  --  6.6  --  7.0  --   --   --   --   --   ALBUMIN 3.8   < > 3.2*   < > 2.6*   < > 2.7* 2.6* 2.6* 2.8*   < > = values in this interval not displayed.    TUMOR MARKERS: No results for input(s): AFPTM, CEA, CA199, CHROMGRNA in the last 8760 hours.  Assessment and Plan:  86 y.o. male with past medical history significant for AAA with stent graft, asthma, basal cell carcinoma, COPD, coronary artery disease, pulmonary hypertension with cor pulmonale/chronic diastolic CHF, atrial fibrillation and prior diverticular bleed last in September 2020.  He presented to the [facility] ED yesterday evening with rectal bleeding which began around 10 PM.  The only anticoagulant he is on presently is aspirin.  He was evaluated by GI service and CT angiogram abdomen and pelvis was ordered which revealed:  VASCULAR  1. Positive for active GI bleeding in the left colon. 2. Endovascular repair of an abdominal aortic aneurysm. The aortic stent graft is patent. 3. Aortic aneurysm sac has slightly enlarged since 2018 and there is a type 2 endoleak on the delayed images.  NON-VASCULAR  1. Enhancing lesion along the right side of the bladder. 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 Recommend urology consultation. 2. Colonic diverticulosis. 3. Question some fluid within the inguinal canals  Current labs include WBC 10.7, hemoglobin 11, platelets  276k, creatinine 1.24, COVID-19 negative  Request now received for visceral/mesenteric arteriogram with possible embolization.  Imaging studies have been reviewed by Dr. Laurence Ferrari.Risks  and benefits of procedure were discussed with the patient/spouse including, but not limited to bleeding, infection, vascular injury or contrast induced renal failure.  This interventional procedure involves the use of X-rays and because of the nature of the planned procedure, it is possible that we will have prolonged use of X-ray fluoroscopy.  Potential radiation risks to you include (but are not limited to) the following: - A slightly elevated risk for cancer  several years later in life. This risk is typically less than 0.5% percent. This risk is low in comparison to the normal incidence of human cancer, which is 33% for women and 50% for men according to the [place]. - Radiation induced injury can include skin redness, resembling a rash, tissue breakdown / ulcers and hair loss (which can be temporary or permanent).   The likelihood of either of these occurring depends on the difficulty of the procedure and whether you are sensitive to radiation due to previous procedures, disease, or genetic conditions.   IF your procedure requires a prolonged use of radiation, you will be notified and given written instructions for further action.  It is your responsibility to monitor the irradiated area for the 2 weeks following the procedure and to notify your physician if you are concerned that you have suffered a radiation induced injury.    All of the patient's questions were answered, patient is agreeable to proceed.  Consent signed and in chart.  Procedure scheduled for this evening.    Thank you for this interesting consult.  I greatly enjoyed meeting Willard Broome and look forward to participating in their care.  A copy of this report was sent to the requesting provider on this date.  Electronically Signed: D. Rowe Robert, PA-C 03/22/2020, 5:32 PM   I spent a total of  30 minutes   in face to face in clinical consultation, greater than 50% of which was counseling/coordinating  care for mesenteric/visceral arteriogram with possible embolization

## 2020-03-22 NOTE — Progress Notes (Addendum)
   Follow Up Note  Please see full H&P done earlier this morning Briefly 86 year old male with history of severe pulmonary hypertension with cor pulmonale, chronic diastolic HF, A. fib not on AC due to rectal bleeding, CAD, presents to the ED complaining of rectal bleeding for the past day prior to arrival.  Patient denies any abdominal pain, nausea/vomiting, fever/chills, chest pain, shortness of breath.  Patient takes only aspirin.  Of note, last episode of rectal bleed was in September 2020 which was thought to be possible diverticular bleed.  In the ER, patient hemodynamically stable, had another large episode of bleeding.  Hemoglobin noted with just a 1 g drop so far.  Due to persistent bleeding, GI was consulted.  Patient was started on clear liquid diet.  Patient admitted for further management.      Exam:  General: NAD, elderly  Cardiovascular: S1, S2 present  Respiratory: CTAB  Abdomen: Soft, nontender, nondistended, bowel sounds present  Musculoskeletal: No bilateral pedal edema noted  Skin: Normal  Psychiatry: Normal mood   Present on Admission: . Rectal bleeding . COPD (chronic obstructive pulmonary disease) ([place]) . ILD (interstitial lung disease) ([place]) . Pulmonary hypertension, unspecified ([place]) . Acute GI bleeding   Assessment/plan Lower GI bleed likely 2/2 diverticular bleed Painless GI consulted, appreciate recs Monitor hemoglobin closely, type and screen Hold off IVF till significantly hypotensive due to chronic comorbidities Hold home aspirin Clear liquid diet Monitor on telemetry  Other medical problems Severe pulmonary hypertension/diastolic HF/cor pulmonale Continue Opsumit, sildenafil, hold diuretics for now  Chronic hypotension Continue midodrine  COPD Continue inhalers  Hypothyroidism Continue Synthroid   ADDENDUM:  CTA abdomen/pelvis ordered by GI was positive for active GI bleeding in the left colon.  IR consult for possible  embolization placed.  Also showed possible bladder cancer, spoke to Dr. Tresa Moore from urology, recommended outpatient work-up/follow-up. (Please place a urology outpatient follow-up upon discharge).  Also noted a type II endoleak in the aortic aneurysm sac which is of no immediate concern as per Dr. Anselm Pancoast from radiology.

## 2020-03-22 NOTE — Procedures (Signed)
Interventional Radiology Procedure Note  Procedure: Visceral angiogram  Complications: None  Estimated Blood Loss: None  Recommendations: - Negative angiogram, no active bleeding at this time.  - Very challenging anatomy s/p EVAR and occlusion of the IMA.  Further angiography unlikely to be of benefit, recommend supportive management.     Signed,  Criselda Peaches, MD

## 2020-03-22 NOTE — ED Notes (Signed)
Nurse 5E reports room is dirty

## 2020-03-23 DIAGNOSIS — J9611 Chronic respiratory failure with hypoxia: Secondary | ICD-10-CM | POA: Diagnosis present

## 2020-03-23 DIAGNOSIS — J449 Chronic obstructive pulmonary disease, unspecified: Secondary | ICD-10-CM | POA: Diagnosis present

## 2020-03-23 DIAGNOSIS — K921 Melena: Secondary | ICD-10-CM | POA: Diagnosis not present

## 2020-03-23 DIAGNOSIS — Y832 Surgical operation with anastomosis, bypass or graft as the cause of abnormal reaction of the patient, or of later complication, without mention of misadventure at the time of the procedure: Secondary | ICD-10-CM | POA: Diagnosis present

## 2020-03-23 DIAGNOSIS — N179 Acute kidney failure, unspecified: Secondary | ICD-10-CM | POA: Diagnosis present

## 2020-03-23 DIAGNOSIS — K625 Hemorrhage of anus and rectum: Secondary | ICD-10-CM | POA: Diagnosis present

## 2020-03-23 DIAGNOSIS — E785 Hyperlipidemia, unspecified: Secondary | ICD-10-CM | POA: Diagnosis present

## 2020-03-23 DIAGNOSIS — I4819 Other persistent atrial fibrillation: Secondary | ICD-10-CM | POA: Diagnosis present

## 2020-03-23 DIAGNOSIS — J849 Interstitial pulmonary disease, unspecified: Secondary | ICD-10-CM | POA: Diagnosis present

## 2020-03-23 DIAGNOSIS — I350 Nonrheumatic aortic (valve) stenosis: Secondary | ICD-10-CM | POA: Diagnosis not present

## 2020-03-23 DIAGNOSIS — Z823 Family history of stroke: Secondary | ICD-10-CM | POA: Diagnosis not present

## 2020-03-23 DIAGNOSIS — I272 Pulmonary hypertension, unspecified: Secondary | ICD-10-CM

## 2020-03-23 DIAGNOSIS — Z85828 Personal history of other malignant neoplasm of skin: Secondary | ICD-10-CM | POA: Diagnosis not present

## 2020-03-23 DIAGNOSIS — Z87891 Personal history of nicotine dependence: Secondary | ICD-10-CM | POA: Diagnosis not present

## 2020-03-23 DIAGNOSIS — Z20822 Contact with and (suspected) exposure to covid-19: Secondary | ICD-10-CM | POA: Diagnosis present

## 2020-03-23 DIAGNOSIS — Z825 Family history of asthma and other chronic lower respiratory diseases: Secondary | ICD-10-CM | POA: Diagnosis not present

## 2020-03-23 DIAGNOSIS — I2729 Other secondary pulmonary hypertension: Secondary | ICD-10-CM | POA: Diagnosis present

## 2020-03-23 DIAGNOSIS — I2781 Cor pulmonale (chronic): Secondary | ICD-10-CM | POA: Diagnosis present

## 2020-03-23 DIAGNOSIS — K922 Gastrointestinal hemorrhage, unspecified: Secondary | ICD-10-CM | POA: Diagnosis not present

## 2020-03-23 DIAGNOSIS — Z79890 Hormone replacement therapy (postmenopausal): Secondary | ICD-10-CM | POA: Diagnosis not present

## 2020-03-23 DIAGNOSIS — D62 Acute posthemorrhagic anemia: Secondary | ICD-10-CM | POA: Diagnosis present

## 2020-03-23 DIAGNOSIS — K5731 Diverticulosis of large intestine without perforation or abscess with bleeding: Secondary | ICD-10-CM | POA: Diagnosis present

## 2020-03-23 DIAGNOSIS — I5082 Biventricular heart failure: Secondary | ICD-10-CM | POA: Diagnosis present

## 2020-03-23 DIAGNOSIS — E039 Hypothyroidism, unspecified: Secondary | ICD-10-CM | POA: Diagnosis present

## 2020-03-23 DIAGNOSIS — I5032 Chronic diastolic (congestive) heart failure: Secondary | ICD-10-CM | POA: Diagnosis present

## 2020-03-23 DIAGNOSIS — Z8679 Personal history of other diseases of the circulatory system: Secondary | ICD-10-CM | POA: Diagnosis not present

## 2020-03-23 DIAGNOSIS — I2510 Atherosclerotic heart disease of native coronary artery without angina pectoris: Secondary | ICD-10-CM | POA: Diagnosis present

## 2020-03-23 DIAGNOSIS — Z951 Presence of aortocoronary bypass graft: Secondary | ICD-10-CM

## 2020-03-23 DIAGNOSIS — Z9981 Dependence on supplemental oxygen: Secondary | ICD-10-CM | POA: Diagnosis not present

## 2020-03-23 DIAGNOSIS — Z7982 Long term (current) use of aspirin: Secondary | ICD-10-CM | POA: Diagnosis not present

## 2020-03-23 DIAGNOSIS — I9789 Other postprocedural complications and disorders of the circulatory system, not elsewhere classified: Secondary | ICD-10-CM | POA: Diagnosis present

## 2020-03-23 LAB — CBC
HCT: 26.3 % — ABNORMAL LOW (ref 39.0–52.0)
Hemoglobin: 8.4 g/dL — ABNORMAL LOW (ref 13.0–17.0)
MCH: 31.3 pg (ref 26.0–34.0)
MCHC: 31.9 g/dL (ref 30.0–36.0)
MCV: 98.1 fL (ref 80.0–100.0)
Platelets: 241 10*3/uL (ref 150–400)
RBC: 2.68 MIL/uL — ABNORMAL LOW (ref 4.22–5.81)
RDW: 17.2 % — ABNORMAL HIGH (ref 11.5–15.5)
WBC: 11.9 10*3/uL — ABNORMAL HIGH (ref 4.0–10.5)
nRBC: 0 % (ref 0.0–0.2)

## 2020-03-23 LAB — BASIC METABOLIC PANEL
Anion gap: 11 (ref 5–15)
BUN: 28 mg/dL — ABNORMAL HIGH (ref 8–23)
CO2: 23 mmol/L (ref 22–32)
Calcium: 8.1 mg/dL — ABNORMAL LOW (ref 8.9–10.3)
Chloride: 104 mmol/L (ref 98–111)
Creatinine, Ser: 0.99 mg/dL (ref 0.61–1.24)
GFR calc Af Amer: >60 mL/min (ref >60)
GFR calc non Af Amer: >60 mL/min (ref >60)
Glucose, Bld: 103 mg/dL — ABNORMAL HIGH (ref 70–99)
Potassium: 4 mmol/L (ref 3.5–5.1)
Sodium: 138 mmol/L (ref 135–145)

## 2020-03-23 LAB — GLUCOSE, CAPILLARY
Glucose-Capillary: 98 mg/dL (ref 70–99)
Glucose-Capillary: 95 mg/dL (ref 70–99)
Glucose-Capillary: 135 mg/dL — ABNORMAL HIGH (ref 70–99)

## 2020-03-23 NOTE — Progress Notes (Signed)
PROGRESS NOTE    Vincent Robinson  [id number] DOB: July 30, 1923 DOA: 03/21/2020 PCP: Katherina Mires, MD    Brief Narrative:  Patient admitted to the hospital with a working diagnosis of acute blood loss anemia due to lower GI bleed/diverticular bleed.  86 year old male who presented with rectal bleeding.  He does have significant past medical history for severe pulmonary hypertension with cor pulmonale, chronic diastolic heart failure, atrial fibrillation and coronary artery disease.  Patient reported 2 large bloody bowel movements, no nausea, no vomiting and no fevers.  On his initial physical examination blood pressure 123/68, heart rate 74, respiratory 21, oxygen saturation 95%, his lungs are clear to auscultation bilaterally, heart S1-S2 present rhythmic, soft abdomen, no lower extremity edema.  Patient has remained hemodynamically stable, further work-up with CT of the abdomen pelvis show active bleeding in the left colon.  IR was consulted, angiogram was negative for active bleeding.  Not candidate for embolization, chronic occlusion of the IMA.  Patient's hemoglobin has slowly trended down.  Assessment & Plan:   Principal Problem:   Rectal bleeding Active Problems:   COPD (chronic obstructive pulmonary disease) (HCC)   ILD (interstitial lung disease) ([place])   Pulmonary hypertension, unspecified (HCC)   Aortic stenosis, moderate   S/P CABG (coronary artery bypass graft)   Acute GI bleeding   1. Acute blood loss anemia due to lower  GI bleed, diverticular bleed. Patient with no abdominal pain, continue to trend down Hgb and Hct. CT abdomen with active bleeding in the left colon. Angiography with no signs of bleeding and difficult vascular anatomy for embolization.   Will continue close follow up on Hgb and Hct, plan for PRBC transfusion if continue to trend down Hgb below 7.  Advance diet as tolerated.   2. Severe pulmonary HTN with chronic core pulmonale. Patient with chronic  hypotension, continue midodrine. Target MAP more than 60. Follow with PT and OT recommendations  3. COPD and ILD. No dyspnea or signs of acute exacerbation, continue close oxymetry monitoring.   4. CAD sp CABG/ Aortic aneurysm sp graft. Positive endoleak, no acute changes, will continue close monitoring.   Status is: Observation change to inpatient   The patient will require care spanning > 2 midnights and should be moved to inpatient because: Inpatient level of care appropriate due to severity of illness, patient need continue close monitoring of Hgb and Hct, that has been worsening and associated with hypotension.   Dispo: The patient is from: Home              Anticipated d/c is to: Home              Anticipated d/c date is: 1 day              Patient currently is not medically stable to d/c.   DVT prophylaxis: scd   Code Status:   Partial   Family Communication:  No family at the bedside     Consultants:   GI   IR   Procedures:   Angiography      Subjective: Patient with drooping Hgb and Hct, no nausea or vomiting and tolerating po well, his blood pressure has been low.   Objective: Vitals:   03/23/20 0605 03/23/20 0831 03/23/20 0902 03/23/20 1435  BP: (!) 104/54  (!) 91/55 (!) 95/58  Pulse: 95  85 75  Resp: 18  17 18   Temp: 98.2 F (36.8 C)  97.7 F (36.5 C) (!) 97.5 F (36.4  C)  TempSrc:   Oral Oral  SpO2: 90% 90% 90% 92%  Weight:      Height:        Intake/Output Summary (Last 24 hours) at 03/23/2020 1551 Last data filed at 03/23/2020 1300 Gross per 24 hour  Intake 255 ml  Output 225 ml  Net 30 ml   Filed Weights   03/22/20 2019  Weight: 76.7 kg    Examination:   General: deconditioned  Neurology: Awake and alert, non focal  E ENT: no pallor, no icterus, oral mucosa moist Cardiovascular: No JVD. S1-S2 present, rhythmic, no gallops, rubs, or murmurs. No lower extremity edema. Pulmonary: positive breath sounds bilaterally, adequate air movement,  no wheezing, rhonchi or rales. Gastrointestinal. Abdomen with no organomegaly, non tender, no rebound or guarding Skin. No rashes Musculoskeletal: no joint deformities     Data Reviewed: I have personally reviewed following labs and imaging studies  CBC: Recent Labs  Lab 03/22/20 0042 03/22/20 0042 03/22/20 0426 03/22/20 0757 03/22/20 1244 03/22/20 1733 03/23/20 0542  WBC 10.2   < > 11.3* 10.7* 12.4* 9.9 11.9*  NEUTROABS 6.2  --   --   --   --   --   --   HGB 12.6*   < > 11.1* 11.0* 11.7* 8.7* 8.4*  HCT 38.6*   < > 33.9* 33.3* 35.7* 27.0* 26.3*  MCV 96.0   < > 96.9 97.4 97.8 98.5 98.1  PLT 315   < > 272 276 340 253 241   < > = values in this interval not displayed.   Basic Metabolic Panel: Recent Labs  Lab 03/22/20 0042 03/22/20 0426 03/23/20 0542  NA 140 139 138  K 3.9 3.9 4.0  CL 102 105 104  CO2 28 25 23   GLUCOSE 112* 107* 103*  BUN 23 23 28*  CREATININE 1.39* 1.24 0.99  CALCIUM 8.6* 8.0* 8.1*   GFR: Estimated Creatinine Clearance: 42.7 mL/min (by C-G formula based on SCr of 0.99 mg/dL). Liver Function Tests: No results for input(s): AST, ALT, ALKPHOS, BILITOT, PROT, ALBUMIN in the last 168 hours. No results for input(s): LIPASE, AMYLASE in the last 168 hours. No results for input(s): AMMONIA in the last 168 hours. Coagulation Profile: Recent Labs  Lab 03/22/20 2008  INR 1.2   Cardiac Enzymes: No results for input(s): CKTOTAL, CKMB, CKMBINDEX, TROPONINI in the last 168 hours. BNP (last 3 results) No results for input(s): PROBNP in the last 8760 hours. HbA1C: No results for input(s): HGBA1C in the last 72 hours. CBG: Recent Labs  Lab 03/22/20 0754 03/22/20 1542 03/22/20 2056 03/23/20 0115 03/23/20 0753  GLUCAP 87 94 125* 98 95   Lipid Profile: No results for input(s): CHOL, HDL, LDLCALC, TRIG, CHOLHDL, LDLDIRECT in the last 72 hours. Thyroid Function Tests: No results for input(s): TSH, T4TOTAL, FREET4, T3FREE, THYROIDAB in the last 72  hours. Anemia Panel: No results for input(s): VITAMINB12, FOLATE, FERRITIN, TIBC, IRON, RETICCTPCT in the last 72 hours.    Radiology Studies: I have reviewed all of the imaging during this hospital visit personally     Scheduled Meds: . levothyroxine  112 mcg Oral Q0600  . macitentan  10 mg Oral Daily  . midodrine  10 mg Oral TID with meals  . mometasone-formoterol  2 puff Inhalation BID  . pantoprazole  20 mg Oral BID  . sildenafil  40 mg Oral TID  . simvastatin  10 mg Oral QHS   Continuous Infusions:   LOS: 0 days  Bannon Giammarco Gerome Apley, MD

## 2020-03-23 NOTE — Progress Notes (Signed)
PROGRESS NOTE  Vincent Robinson [id number] DOB: [birth year] DOA: 03/21/2020 PCP: Katherina Mires, MD   LOS: 0 days   Brief Narrative / Interim history: Vincent Robinson is a 86 year old male with a pertinent medical history of severe pulmonary hypertension with cor pulmonale, ILD, COPD, OSA, hypothyroidism, chronic diastolic HF, A. fib (not on anticoagulation due to recurrent rectal bleeding), CAD s/p CABG on ASA 81mg  QD, HLD, AAA, aortic stenosis and prior history of GI bleeds, presents to the ED complaining of rectal bleeding for the past day prior to arrival.    Last episode of rectal bleed was in September 2020 which was thought to be possible diverticular bleed. Patient says current episode began around 10:00 PM when he was getting ready to go to bed.  Had 2 episodes before presenting to the ED. He is now had 2 episodes in the ED. He describes this as being red/maroon-colored blood. Exact same scenario as previous diverticular bleeds. Patient is hemodynamically stable. Hemoglobin noted with a 3 g drop so far. GI was consulted due to persistent bleeding. Patient was started on clear liquid diet and admitted for further management.   Subjective / 24h Interval events: Patient was resting comfortably in bed on morning rounds. Patient denies any abdominal pain, nausea/vomiting, fever/chills, chest pain, shortness of breath, and is generally without any pain and states he "feels back to normal".  Assessment & Plan: Principal Problem Acute blood loss anemia in the setting of acute on chronic GI bleed: Hgb 11.7 on 6/1 and currently 8.4. Suspicion for diverticular bleed based on prior encounter diagnosis. Patient endorses 3 prior episodes, last of which in September 2020 requiring 4 units PRBC transfusions and pressors. 05/2017 colonoscopy (to cecum) showed diverticulosis in transverse and descending colon with blood in the transverse, descending, sigmoid, rectum without clear site of bleeding. No re-evaluation  since. GI consulted. CTA on 6/2 positive for left-sided GI bleed, though subsequent angio done prior to IR embolization was negative. Embolization not completed due to difficult anatomy with history of TAVR. Per GI recommendations, continue serial H&H, with transfusion threshold <7. Continue IV iron replacement with Feraheme X2. Continue pantoprazole. Continue diet as tolerated. Repeat colonoscopy only upon new active bleeding. Otherwise monitor for 24-48 hours with discharge home if improved.    Active Problems Severe pulmonary hypertension/diastolic HF/cor pulmonale/ILD: Continue Opsumi, sildenafil and oxygen supplementation. Hold diuretics for now.   Persistent A.fib, CAD: Not on anticoagulation due to history of chronic GI bleed. On ASA 81mg  QD only.  Chronic hypotension: continue Midodrine.  Hyperlipidemia: continue Simvastatin.   COPD: continue bronchodilators, oxygen.  Hypothyroidism: continue levothyroxine.  Urothelial carcinoma - urology outpatient follow-up upon discharge.   Aortic aneurism: new type II endoleak noted on CTA. Flow-up with outpatient cardiology.   Scheduled Meds: . ferrous sulfate  325 mg Oral BID WC  . levothyroxine  112 mcg Oral Q0600  . macitentan  10 mg Oral Daily  . midodrine  10 mg Oral TID with meals  . mometasone-formoterol  2 puff Inhalation BID  . pantoprazole  20 mg Oral BID  . sildenafil  40 mg Oral TID  . simvastatin  10 mg Oral QHS   Continuous Infusions: PRN Meds:.albuterol, ondansetron **OR** ondansetron (ZOFRAN) IV  DVT prophylaxis: SCDs, anticoagulation held in the setting of GI bleed. Code Status: Partial code. Family Communication: No family at bedside on rounds.  Status is: Observation  The patient will require care spanning > 2 midnights and should be moved to inpatient because: Inpatient level  of care appropriate due to severity of illness  Dispo: The patient is from: Home              Anticipated d/c is to: Home               Anticipated d/c date is: 2 days              Patient currently is not medically stable to d/c.   Consultants:  GI, much appreciated.  Procedures:  2D echo: None Foley: None BiPAP: Yes, setting of 20/6. HD: None  Microbiology  Covid negative.  Antimicrobials: None.   Objective: Vitals:   03/23/20 0114 03/23/20 0605 03/23/20 0831 03/23/20 0902  BP: 123/69 (!) 104/54  (!) 91/55  Pulse: 89 95  85  Resp: 17 18  17   Temp: 98.4 F (36.9 C) 98.2 F (36.8 C)  97.7 F (36.5 C)  TempSrc:    Oral  SpO2: 98% 90% 90% 90%  Weight:      Height:        Intake/Output Summary (Last 24 hours) at 03/23/2020 1140 Last data filed at 03/23/2020 1020 Gross per 24 hour  Intake 255 ml  Output 125 ml  Net 130 ml   Filed Weights   03/22/20 2019  Weight: 76.7 kg    Examination:  Constitutional: NAD Eyes: no scleral icterus ENMT: Mucous membranes are moist.  Neck: normal, supple Respiratory: clear to auscultation bilaterally, no wheezing, no crackles. Normal respiratory effort. No accessory muscle use.  Cardiovascular: Regular rate and rhythm, no murmurs / rubs / gallops. No LE edema. Good peripheral pulses Abdomen: non distended, no tenderness. Bowel sounds positive.  Musculoskeletal: no clubbing / cyanosis.  Skin: no rashes Neurologic: CN 2-12 grossly intact. Strength 5/5 in all 4.  Psychiatric: Normal judgment and insight. Alert and oriented x 3. Normal mood.    Data Reviewed: I have independently reviewed following labs and imaging studies   CBC: Recent Labs  Lab 03/22/20 0042 03/22/20 0042 03/22/20 0426 03/22/20 0757 03/22/20 1244 03/22/20 1733 03/23/20 0542  WBC 10.2   < > 11.3* 10.7* 12.4* 9.9 11.9*  NEUTROABS 6.2  --   --   --   --   --   --   HGB 12.6*   < > 11.1* 11.0* 11.7* 8.7* 8.4*  HCT 38.6*   < > 33.9* 33.3* 35.7* 27.0* 26.3*  MCV 96.0   < > 96.9 97.4 97.8 98.5 98.1  PLT 315   < > 272 276 340 253 241   < > = values in this interval not displayed.    Basic Metabolic Panel: Recent Labs  Lab 03/22/20 0042 03/22/20 0426 03/23/20 0542  NA 140 139 138  K 3.9 3.9 4.0  CL 102 105 104  CO2 28 25 23   GLUCOSE 112* 107* 103*  BUN 23 23 28*  CREATININE 1.39* 1.24 0.99  CALCIUM 8.6* 8.0* 8.1*   Liver Function Tests: No results for input(s): AST, ALT, ALKPHOS, BILITOT, PROT, ALBUMIN in the last 168 hours. Coagulation Profile: Recent Labs  Lab 03/22/20 2008  INR 1.2   HbA1C: No results for input(s): HGBA1C in the last 72 hours. CBG: Recent Labs  Lab 03/22/20 0754 03/22/20 1542 03/22/20 2056 03/23/20 0115 03/23/20 0753  GLUCAP 87 94 125* 98 95    Recent Results (from the past 240 hour(s))  SARS Coronavirus 2 by RT PCR (hospital order, performed in [facility] hospital lab) Nasopharyngeal Nasopharyngeal Swab     Status: None  Collection Time: 03/22/20  3:30 AM   Specimen: Nasopharyngeal Swab  Result Value Ref Range Status   SARS Coronavirus 2 NEGATIVE NEGATIVE Final    Comment: (NOTE) SARS-CoV-2 target nucleic acids are NOT DETECTED. The SARS-CoV-2 RNA is generally detectable in upper and lower respiratory specimens during the acute phase of infection. The lowest concentration of SARS-CoV-2 viral copies this assay can detect is 250 copies / mL. A negative result does not preclude SARS-CoV-2 infection and should not be used as the sole basis for treatment or other patient management decisions.  A negative result may occur with improper specimen collection / handling, submission of specimen other than nasopharyngeal swab, presence of viral mutation(s) within the areas targeted by this assay, and inadequate number of viral copies (<250 copies / mL). A negative result must be combined with clinical observations, patient history, and epidemiological information. Fact Sheet for Patients:   StrictlyIdeas.no Fact Sheet for Healthcare Providers: BankingDealers.co.za This test is  not yet approved or cleared  by the [place] FDA and has been authorized for detection and/or diagnosis of SARS-CoV-2 by FDA under an Emergency Use Authorization (EUA).  This EUA will remain in effect (meaning this test can be used) for the duration of the COVID-19 declaration under Section 564(b)(1) of the Act, 21 U.S.C. section 360bbb-3(b)(1), unless the authorization is terminated or revoked sooner. Performed at [facility], [place] [address], [place], [place] [address]      Radiology Studies: IR Angiogram Visceral Selective  Result Date: 03/23/2020 INDICATION: 86 year old male with a history of abdominal aortic aneurysm status post endovascular aortic repair. He presents with active lower GI bleeding. CTA of the abdomen and pelvis performed earlier today demonstrated a focus of active bleeding in the proximal descending colon at 15:22 p.m. Patient now presents for arteriogram and possible embolization. EXAM: SELECTIVE VISCERAL ARTERIOGRAPHY; ADDITIONAL ARTERIOGRAPHY; IR ULTRASOUND GUIDANCE VASC ACCESS RIGHT 1. Ultrasound-guided vascular access right common femoral artery 2. Catheterization of the celiac axis with arteriogram 3. Catheterization of the SMA with arteriogram 4. Catheterization of jejunal branch with arteriogram 5. Catheterization of accessory middle colic artery with arteriogram 6. Catheterization of 2nd jejunal branch with arteriogram MEDICATIONS: None ANESTHESIA/SEDATION: None CONTRAST:  17mL OMNIPAQUE IOHEXOL 300 MG/ML SOLN, 98mL OMNIPAQUE IOHEXOL 300 MG/ML SOLN, 52mL OMNIPAQUE IOHEXOL 300 MG/ML SOLN FLUOROSCOPY TIME:  Fluoroscopy Time: 17 minutes 24 seconds (1690 mGy). COMPLICATIONS: None immediate. PROCEDURE: Informed consent was obtained from the patient following explanation of the procedure, risks, benefits and alternatives. The patient understands, agrees and consents for the procedure. All questions were addressed. A time out was performed prior to the  initiation of the procedure. Maximal barrier sterile technique utilized including caps, mask, sterile gowns, sterile gloves, large sterile drape, hand hygiene, and Betadine prep. The right common femoral artery was interrogated with ultrasound and found to be widely patent. An image was obtained and stored for the medical record. Local anesthesia was attained by infiltration with 1% lidocaine. A small dermatotomy was made. Under real-time sonographic guidance, the vessel was punctured with a 21 gauge micropuncture needle. Using standard technique, the initial micro needle was exchanged over a 0.018 micro wire for a transitional 4 [place] micro sheath. The micro sheath was then exchanged over a 0.035 wire for a 5 French vascular sheath. A C2 cobra catheter was advanced over a Bentson wire into the abdominal aorta. The catheter was used to select the celiac axis. An arteriogram was performed. Celiac arterial anatomy is normal. No evidence of replaced  middle colic artery. No evidence of active hemorrhage. The catheter was next used to select the origin of the superior mesenteric artery. Digital subtraction angiography was performed. The middle colic artery is relatively hypertrophic in provide supply to the region of bleeding seen on the recent CT arteriogram. There is no evidence of active extravasation at the splenic flexure or within the descending colon at this time. In an effort to investigate further, a renegade STC microcatheter was advanced coaxially through the 5 French catheter over a Fathom 16 wire. For the next 15 minutes, extensive attempts were made to catheterize the middle colic artery without success. Other vessels catheterized and interrogated included the common trunk of 2 proximal jejunal branches. Arteriography demonstrates no evidence of active bleeding from the small bowel. Additionally, arteriography as the catheter was pulled back into the SMA confirms that the middle colic artery does not arise  from this arterial trunk. Continued interrogations of the SMA from a multiple angles and obliquities was performed. The origin of the middle colic artery overlaps the origin of several other arteries and could not be catheterized. Ultimately, the microcatheter was advanced into a small proximal artery. Arteriography demonstrates that this is an accessory middle colic artery providing arterial supply to the mid segment of the transverse colon. There is no evidence of active bleeding or vascular abnormality. Finally, the microcatheter was advanced into an arterial branch arising from the apparent site of the origin of the dominant middle colic artery. However, arteriography confirms that this is in fact another jejunal branch. No evidence of active bleeding from this arterial trunk. No communication with the middle colic artery. At this point, the microcatheter was removed. One final arteriogram was performed through the 5 French catheter at the SMA providing excellent opacification of the sigmoid colon. Again, no evidence of active bleeding. No further angiography was performed. The catheters were removed. Hemostasis was attained with the assistance of a 5 [place] Exoseal. IMPRESSION: 1. Negative multi selective visceral angiogram. No evidence of active GI bleeding. 2. Extremely tortuous vascular anatomy secondary to prior stent graft repair of the abdominal aorta and embolization of the origin of the inferior mesenteric artery resulting in collateralization through the SMA. Further angiography is unlikely to be of benefit. PLAN: Further angiography is unlikely to be of benefit. Recommend supportive care with serial evaluation of the hemoglobin and hematocrit and transfusion as necessary. Signed, Criselda Peaches, MD, [place] [facility] Radiology Electronically Signed   By: Jacqulynn Cadet M.D.   On: 03/23/2020 08:28   IR Angiogram Selective Each Additional  Vessel  Result Date: 03/23/2020 INDICATION: 86 year old male with a history of abdominal aortic aneurysm status post endovascular aortic repair. He presents with active lower GI bleeding. CTA of the abdomen and pelvis performed earlier today demonstrated a focus of active bleeding in the proximal descending colon at 15:22 p.m. Patient now presents for arteriogram and possible embolization. EXAM: SELECTIVE VISCERAL ARTERIOGRAPHY; ADDITIONAL ARTERIOGRAPHY; IR ULTRASOUND GUIDANCE VASC ACCESS RIGHT 1. Ultrasound-guided vascular access right common femoral artery 2. Catheterization of the celiac axis with arteriogram 3. Catheterization of the SMA with arteriogram 4. Catheterization of jejunal branch with arteriogram 5. Catheterization of accessory middle colic artery with arteriogram 6. Catheterization of 2nd jejunal branch with arteriogram MEDICATIONS: None ANESTHESIA/SEDATION: None CONTRAST:  7mL OMNIPAQUE IOHEXOL 300 MG/ML SOLN, 31mL OMNIPAQUE IOHEXOL 300 MG/ML SOLN, 17mL OMNIPAQUE IOHEXOL 300 MG/ML SOLN FLUOROSCOPY TIME:  Fluoroscopy Time: 17 minutes 24 seconds (1690 mGy). COMPLICATIONS: None immediate. PROCEDURE:  Informed consent was obtained from the patient following explanation of the procedure, risks, benefits and alternatives. The patient understands, agrees and consents for the procedure. All questions were addressed. A time out was performed prior to the initiation of the procedure. Maximal barrier sterile technique utilized including caps, mask, sterile gowns, sterile gloves, large sterile drape, hand hygiene, and Betadine prep. The right common femoral artery was interrogated with ultrasound and found to be widely patent. An image was obtained and stored for the medical record. Local anesthesia was attained by infiltration with 1% lidocaine. A small dermatotomy was made. Under real-time sonographic guidance, the vessel was punctured with a 21 gauge micropuncture needle. Using standard technique, the  initial micro needle was exchanged over a 0.018 micro wire for a transitional 4 [place] micro sheath. The micro sheath was then exchanged over a 0.035 wire for a 5 French vascular sheath. A C2 cobra catheter was advanced over a Bentson wire into the abdominal aorta. The catheter was used to select the celiac axis. An arteriogram was performed. Celiac arterial anatomy is normal. No evidence of replaced middle colic artery. No evidence of active hemorrhage. The catheter was next used to select the origin of the superior mesenteric artery. Digital subtraction angiography was performed. The middle colic artery is relatively hypertrophic in provide supply to the region of bleeding seen on the recent CT arteriogram. There is no evidence of active extravasation at the splenic flexure or within the descending colon at this time. In an effort to investigate further, a renegade STC microcatheter was advanced coaxially through the 5 French catheter over a Fathom 16 wire. For the next 15 minutes, extensive attempts were made to catheterize the middle colic artery without success. Other vessels catheterized and interrogated included the common trunk of 2 proximal jejunal branches. Arteriography demonstrates no evidence of active bleeding from the small bowel. Additionally, arteriography as the catheter was pulled back into the SMA confirms that the middle colic artery does not arise from this arterial trunk. Continued interrogations of the SMA from a multiple angles and obliquities was performed. The origin of the middle colic artery overlaps the origin of several other arteries and could not be catheterized. Ultimately, the microcatheter was advanced into a small proximal artery. Arteriography demonstrates that this is an accessory middle colic artery providing arterial supply to the mid segment of the transverse colon. There is no evidence of active bleeding or vascular abnormality. Finally, the microcatheter was advanced into  an arterial branch arising from the apparent site of the origin of the dominant middle colic artery. However, arteriography confirms that this is in fact another jejunal branch. No evidence of active bleeding from this arterial trunk. No communication with the middle colic artery. At this point, the microcatheter was removed. One final arteriogram was performed through the 5 French catheter at the SMA providing excellent opacification of the sigmoid colon. Again, no evidence of active bleeding. No further angiography was performed. The catheters were removed. Hemostasis was attained with the assistance of a 5 [place] Exoseal. IMPRESSION: 1. Negative multi selective visceral angiogram. No evidence of active GI bleeding. 2. Extremely tortuous vascular anatomy secondary to prior stent graft repair of the abdominal aorta and embolization of the origin of the inferior mesenteric artery resulting in collateralization through the SMA. Further angiography is unlikely to be of benefit. PLAN: Further angiography is unlikely to be of benefit. Recommend supportive care with serial evaluation of the hemoglobin and hematocrit and transfusion as necessary. Signed, Antonietta Jewel.  Laurence Ferrari, MD, [place] [facility] Radiology Electronically Signed   By: Jacqulynn Cadet M.D.   On: 03/23/2020 08:28   IR US Guide Vasc Access Right  Result Date: 03/23/2020 INDICATION: 86 year old male with a history of abdominal aortic aneurysm status post endovascular aortic repair. He presents with active lower GI bleeding. CTA of the abdomen and pelvis performed earlier today demonstrated a focus of active bleeding in the proximal descending colon at 15:22 p.m. Patient now presents for arteriogram and possible embolization. EXAM: SELECTIVE VISCERAL ARTERIOGRAPHY; ADDITIONAL ARTERIOGRAPHY; IR ULTRASOUND GUIDANCE VASC ACCESS RIGHT 1. Ultrasound-guided vascular access right common femoral artery 2.  Catheterization of the celiac axis with arteriogram 3. Catheterization of the SMA with arteriogram 4. Catheterization of jejunal branch with arteriogram 5. Catheterization of accessory middle colic artery with arteriogram 6. Catheterization of 2nd jejunal branch with arteriogram MEDICATIONS: None ANESTHESIA/SEDATION: None CONTRAST:  67mL OMNIPAQUE IOHEXOL 300 MG/ML SOLN, 57mL OMNIPAQUE IOHEXOL 300 MG/ML SOLN, 70mL OMNIPAQUE IOHEXOL 300 MG/ML SOLN FLUOROSCOPY TIME:  Fluoroscopy Time: 17 minutes 24 seconds (1690 mGy). COMPLICATIONS: None immediate. PROCEDURE: Informed consent was obtained from the patient following explanation of the procedure, risks, benefits and alternatives. The patient understands, agrees and consents for the procedure. All questions were addressed. A time out was performed prior to the initiation of the procedure. Maximal barrier sterile technique utilized including caps, mask, sterile gowns, sterile gloves, large sterile drape, hand hygiene, and Betadine prep. The right common femoral artery was interrogated with ultrasound and found to be widely patent. An image was obtained and stored for the medical record. Local anesthesia was attained by infiltration with 1% lidocaine. A small dermatotomy was made. Under real-time sonographic guidance, the vessel was punctured with a 21 gauge micropuncture needle. Using standard technique, the initial micro needle was exchanged over a 0.018 micro wire for a transitional 4 [place] micro sheath. The micro sheath was then exchanged over a 0.035 wire for a 5 French vascular sheath. A C2 cobra catheter was advanced over a Bentson wire into the abdominal aorta. The catheter was used to select the celiac axis. An arteriogram was performed. Celiac arterial anatomy is normal. No evidence of replaced middle colic artery. No evidence of active hemorrhage. The catheter was next used to select the origin of the superior mesenteric artery. Digital subtraction angiography  was performed. The middle colic artery is relatively hypertrophic in provide supply to the region of bleeding seen on the recent CT arteriogram. There is no evidence of active extravasation at the splenic flexure or within the descending colon at this time. In an effort to investigate further, a renegade STC microcatheter was advanced coaxially through the 5 French catheter over a Fathom 16 wire. For the next 15 minutes, extensive attempts were made to catheterize the middle colic artery without success. Other vessels catheterized and interrogated included the common trunk of 2 proximal jejunal branches. Arteriography demonstrates no evidence of active bleeding from the small bowel. Additionally, arteriography as the catheter was pulled back into the SMA confirms that the middle colic artery does not arise from this arterial trunk. Continued interrogations of the SMA from a multiple angles and obliquities was performed. The origin of the middle colic artery overlaps the origin of several other arteries and could not be catheterized. Ultimately, the microcatheter was advanced into a small proximal artery. Arteriography demonstrates that this is an accessory middle colic artery providing arterial supply to the mid segment of the transverse colon. There is no evidence of  active bleeding or vascular abnormality. Finally, the microcatheter was advanced into an arterial branch arising from the apparent site of the origin of the dominant middle colic artery. However, arteriography confirms that this is in fact another jejunal branch. No evidence of active bleeding from this arterial trunk. No communication with the middle colic artery. At this point, the microcatheter was removed. One final arteriogram was performed through the 5 French catheter at the SMA providing excellent opacification of the sigmoid colon. Again, no evidence of active bleeding. No further angiography was performed. The catheters were removed.  Hemostasis was attained with the assistance of a 5 [place] Exoseal. IMPRESSION: 1. Negative multi selective visceral angiogram. No evidence of active GI bleeding. 2. Extremely tortuous vascular anatomy secondary to prior stent graft repair of the abdominal aorta and embolization of the origin of the inferior mesenteric artery resulting in collateralization through the SMA. Further angiography is unlikely to be of benefit. PLAN: Further angiography is unlikely to be of benefit. Recommend supportive care with serial evaluation of the hemoglobin and hematocrit and transfusion as necessary. Signed, Criselda Peaches, MD, [place] [facility] Radiology Electronically Signed   By: Jacqulynn Cadet M.D.   On: 03/23/2020 08:28   CT Angio Abd/Pel w/ and/or w/o  Result Date: 03/22/2020 CLINICAL DATA:  86 year old with rectal bleeding. EXAM: CT ANGIOGRAPHY ABDOMEN AND PELVIS WITH CONTRAST AND WITHOUT CONTRAST TECHNIQUE: Multidetector CT imaging of the abdomen and pelvis was performed using the standard protocol during bolus administration of intravenous contrast. Multiplanar reconstructed images and MIPs were obtained and reviewed to evaluate the vascular anatomy. CONTRAST:  69mL OMNIPAQUE IOHEXOL 350 MG/ML SOLN COMPARISON:  CT abdomen and pelvis 06/05/2017 FINDINGS: VASCULAR Aorta: Patient has undergone endovascular repair of an abdominal aortic aneurysm with bifurcated aortic stent graft. The aorta and stent graft are patent. The native aortic aneurysm sac measures up to 4.8 cm and measured roughly 4.6 cm in 2018. There is evidence for an endoleak on the venous phase of imaging. There is contrast within the aneurysm sac posterior to the graft on sequence 13, image 47. 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 Celiac: Celiac trunk is patent without significant stenosis. SMA: Minimal plaque at the origin of the SMA without  significant stenosis. Evidence for active contrast extravasation within the descending colon on sequence 6, image 69. There is accumulation of contrast in the same area on the delayed images on sequence 13, image 27. Findings are compatible with active GI bleeding. Renals: Bilateral renal arteries are patent without significant stenosis. Small accessory left renal artery. Aortic stent graft is positioned below the renal arteries. IMA: Embolization coils at the origin of the IMA likely related to previous type 2 leak treatment. Reconstitution of the distal IMA branches. Inflow: Bifurcated aortic stent graft extends into the common iliac arteries bilaterally. Both limbs are widely patent. There is flow in the internal and external iliac arteries bilaterally. Proximal Outflow: Proximal femoral arteries are patent bilaterally. Veins: Patent veins are patent. Main portal venous system is patent. Renal veins are patent. No gross abnormality to the iliac veins. Review of the MIP images confirms the above findings. NON-VASCULAR Lower chest: Prominent interstitial lung markings bilaterally suggestive for chronic changes and areas of scarring and fibrosis. No pleural effusions. Hepatobiliary: Mild dilatation of left hepatic bile ducts is unchanged. There may be some dilated peripheral bile ducts in the right hepatic lobe which are chronic. No acute  abnormality to the liver or gallbladder. Pancreas: Mild dilatation of the main pancreatic duct is chronic. No evidence for pancreatic inflammation. Spleen: Spleen is small for size and stable. Adrenals/Urinary Tract: Normal appearance of the adrenal glands. No hydronephrosis. No suspicious renal lesions. There is evidence for a diverticulum involving the right posterior aspect of the urinary bladder. There is high-density material near the diverticulum that is suspicious for an enhancing lesion measuring roughly 2.3 cm. Stomach/Bowel: Rectum is mildly distended with gas and fluid.  Extensive diverticulosis in the descending colon and proximal sigmoid colon region. Evidence for active GI bleeding involving the descending colon along the left lateral abdomen. No other area suspicious for active GI bleeding. Normal appearance of the stomach. Lymphatic: No abdominal or pelvic lymphadenopathy. Reproductive: Prostate is unremarkable. Other: Negative for free fluid. There is probably some fluid within the lower inguinal canals. Musculoskeletal: No acute bone abnormality. Mild anterolisthesis at L4-L5. IMPRESSION: VASCULAR 1. Positive for active GI bleeding in the left colon. 2. Endovascular repair of an abdominal aortic aneurysm. The aortic stent graft is patent. 3. Aortic aneurysm sac has slightly enlarged since 2018 and there is a type 2 endoleak on the delayed images. NON-VASCULAR 1. Enhancing lesion along the right side of the bladder. 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 Recommend urology consultation. 2. Colonic diverticulosis. 3. Question some fluid within the inguinal canals. These results were called by telephone at the time of interpretation on 03/22/2020 at 3:19 pm to provider Dr. Horris Latino, who verbally acknowledged these results. Electronically Signed   By: Markus Daft M.D.   On: 03/22/2020 15:22

## 2020-03-23 NOTE — Progress Notes (Addendum)
[facility] Progress Note  CC:  GI bleed  Subjective:  Just had a BM/passed blood.  Was dark in color, like old blood.  Hgb did drop about 3 grams since yesterday.  Otherwise feels ok.  Eating an omelette for breakfast during our visit.  Objective:  Vital signs in last 24 hours: Temp:  [97.5 F (36.4 C)-98.4 F (36.9 C)] 98.2 F (36.8 C) (06/02 0605) Pulse Rate:  [63-95] 95 (06/02 0605) Resp:  [14-29] 18 (06/02 0605) BP: (90-123)/(50-69) 104/54 (06/02 0605) SpO2:  [90 %-100 %] 90 % (06/02 0831) Weight:  [76.7 kg] 76.7 kg (06/01 2019) Last BM Date: 03/21/20 General:  Alert, Well-developed, in NAD Heart:  Irregularly irregular. Pulm:  CTAB.  No increased WOB. Abdomen:  Soft, non-distended.  BS present.  Non-tender.   Extremities:  Without edema. Neurologic:  Alert and oriented x 4;  grossly normal neurologically. Psych:  Alert and cooperative. Normal mood and affect.  Lab Results: Recent Labs    03/22/20 1244 03/22/20 1733 03/23/20 0542  WBC 12.4* 9.9 11.9*  HGB 11.7* 8.7* 8.4*  HCT 35.7* 27.0* 26.3*  PLT 340 253 241   BMET Recent Labs    03/22/20 0042 03/22/20 0426 03/23/20 0542  NA 140 139 138  K 3.9 3.9 4.0  CL 102 105 104  CO2 28 25 23   GLUCOSE 112* 107* 103*  BUN 23 23 28*  CREATININE 1.39* 1.24 0.99  CALCIUM 8.6* 8.0* 8.1*   PT/INR Recent Labs    03/22/20 2008  LABPROT 14.9  INR 1.2   IR Angiogram Visceral Selective  Result Date: 03/23/2020 INDICATION: 86 year old male with a history of abdominal aortic aneurysm status post endovascular aortic repair. He presents with active lower GI bleeding. CTA of the abdomen and pelvis performed earlier today demonstrated a focus of active bleeding in the proximal descending colon at 15:22 p.m. Patient now presents for arteriogram and possible embolization. EXAM: SELECTIVE VISCERAL ARTERIOGRAPHY; ADDITIONAL ARTERIOGRAPHY; IR ULTRASOUND GUIDANCE VASC ACCESS RIGHT 1. Ultrasound-guided vascular  access right common femoral artery 2. Catheterization of the celiac axis with arteriogram 3. Catheterization of the SMA with arteriogram 4. Catheterization of jejunal branch with arteriogram 5. Catheterization of accessory middle colic artery with arteriogram 6. Catheterization of 2nd jejunal branch with arteriogram MEDICATIONS: None ANESTHESIA/SEDATION: None CONTRAST:  45mL OMNIPAQUE IOHEXOL 300 MG/ML SOLN, 55mL OMNIPAQUE IOHEXOL 300 MG/ML SOLN, 21mL OMNIPAQUE IOHEXOL 300 MG/ML SOLN FLUOROSCOPY TIME:  Fluoroscopy Time: 17 minutes 24 seconds (1690 mGy). COMPLICATIONS: None immediate. PROCEDURE: Informed consent was obtained from the patient following explanation of the procedure, risks, benefits and alternatives. The patient understands, agrees and consents for the procedure. All questions were addressed. A time out was performed prior to the initiation of the procedure. Maximal barrier sterile technique utilized including caps, mask, sterile gowns, sterile gloves, large sterile drape, hand hygiene, and Betadine prep. The right common femoral artery was interrogated with ultrasound and found to be widely patent. An image was obtained and stored for the medical record. Local anesthesia was attained by infiltration with 1% lidocaine. A small dermatotomy was made. Under real-time sonographic guidance, the vessel was punctured with a 21 gauge micropuncture needle. Using standard technique, the initial micro needle was exchanged over a 0.018 micro wire for a transitional 4 [place] micro sheath. The micro sheath was then exchanged over a 0.035 wire for a 5 French vascular sheath. A C2 cobra catheter was advanced over a Bentson wire into the abdominal aorta. The catheter was used  to select the celiac axis. An arteriogram was performed. Celiac arterial anatomy is normal. No evidence of replaced middle colic artery. No evidence of active hemorrhage. The catheter was next used to select the origin of the superior mesenteric  artery. Digital subtraction angiography was performed. The middle colic artery is relatively hypertrophic in provide supply to the region of bleeding seen on the recent CT arteriogram. There is no evidence of active extravasation at the splenic flexure or within the descending colon at this time. In an effort to investigate further, a renegade STC microcatheter was advanced coaxially through the 5 French catheter over a Fathom 16 wire. For the next 15 minutes, extensive attempts were made to catheterize the middle colic artery without success. Other vessels catheterized and interrogated included the common trunk of 2 proximal jejunal branches. Arteriography demonstrates no evidence of active bleeding from the small bowel. Additionally, arteriography as the catheter was pulled back into the SMA confirms that the middle colic artery does not arise from this arterial trunk. Continued interrogations of the SMA from a multiple angles and obliquities was performed. The origin of the middle colic artery overlaps the origin of several other arteries and could not be catheterized. Ultimately, the microcatheter was advanced into a small proximal artery. Arteriography demonstrates that this is an accessory middle colic artery providing arterial supply to the mid segment of the transverse colon. There is no evidence of active bleeding or vascular abnormality. Finally, the microcatheter was advanced into an arterial branch arising from the apparent site of the origin of the dominant middle colic artery. However, arteriography confirms that this is in fact another jejunal branch. No evidence of active bleeding from this arterial trunk. No communication with the middle colic artery. At this point, the microcatheter was removed. One final arteriogram was performed through the 5 French catheter at the SMA providing excellent opacification of the sigmoid colon. Again, no evidence of active bleeding. No further angiography was  performed. The catheters were removed. Hemostasis was attained with the assistance of a 5 [place] Exoseal. IMPRESSION: 1. Negative multi selective visceral angiogram. No evidence of active GI bleeding. 2. Extremely tortuous vascular anatomy secondary to prior stent graft repair of the abdominal aorta and embolization of the origin of the inferior mesenteric artery resulting in collateralization through the SMA. Further angiography is unlikely to be of benefit. PLAN: Further angiography is unlikely to be of benefit. Recommend supportive care with serial evaluation of the hemoglobin and hematocrit and transfusion as necessary. Signed, Criselda Peaches, MD, [facility] Radiology Electronically Signed   By: Jacqulynn Cadet M.D.   On: 03/23/2020 08:28   IR Angiogram Selective Each Additional Vessel  Result Date: 03/23/2020 INDICATION: 86 year old male with a history of abdominal aortic aneurysm status post endovascular aortic repair. He presents with active lower GI bleeding. CTA of the abdomen and pelvis performed earlier today demonstrated a focus of active bleeding in the proximal descending colon at 15:22 p.m. Patient now presents for arteriogram and possible embolization. EXAM: SELECTIVE VISCERAL ARTERIOGRAPHY; ADDITIONAL ARTERIOGRAPHY; IR ULTRASOUND GUIDANCE VASC ACCESS RIGHT 1. Ultrasound-guided vascular access right common femoral artery 2. Catheterization of the celiac axis with arteriogram 3. Catheterization of the SMA with arteriogram 4. Catheterization of jejunal branch with arteriogram 5. Catheterization of accessory middle colic artery with arteriogram 6. Catheterization of 2nd jejunal branch with arteriogram MEDICATIONS: None ANESTHESIA/SEDATION: None CONTRAST:  20mL OMNIPAQUE IOHEXOL 300 MG/ML SOLN, 68mL OMNIPAQUE IOHEXOL 300 MG/ML SOLN, 65mL OMNIPAQUE IOHEXOL  300 MG/ML SOLN FLUOROSCOPY TIME:  Fluoroscopy Time: 17 minutes 24 seconds (1690 mGy).  COMPLICATIONS: None immediate. PROCEDURE: Informed consent was obtained from the patient following explanation of the procedure, risks, benefits and alternatives. The patient understands, agrees and consents for the procedure. All questions were addressed. A time out was performed prior to the initiation of the procedure. Maximal barrier sterile technique utilized including caps, mask, sterile gowns, sterile gloves, large sterile drape, hand hygiene, and Betadine prep. The right common femoral artery was interrogated with ultrasound and found to be widely patent. An image was obtained and stored for the medical record. Local anesthesia was attained by infiltration with 1% lidocaine. A small dermatotomy was made. Under real-time sonographic guidance, the vessel was punctured with a 21 gauge micropuncture needle. Using standard technique, the initial micro needle was exchanged over a 0.018 micro wire for a transitional 4 [place] micro sheath. The micro sheath was then exchanged over a 0.035 wire for a 5 French vascular sheath. A C2 cobra catheter was advanced over a Bentson wire into the abdominal aorta. The catheter was used to select the celiac axis. An arteriogram was performed. Celiac arterial anatomy is normal. No evidence of replaced middle colic artery. No evidence of active hemorrhage. The catheter was next used to select the origin of the superior mesenteric artery. Digital subtraction angiography was performed. The middle colic artery is relatively hypertrophic in provide supply to the region of bleeding seen on the recent CT arteriogram. There is no evidence of active extravasation at the splenic flexure or within the descending colon at this time. In an effort to investigate further, a renegade STC microcatheter was advanced coaxially through the 5 French catheter over a Fathom 16 wire. For the next 15 minutes, extensive attempts were made to catheterize the middle colic artery without success. Other  vessels catheterized and interrogated included the common trunk of 2 proximal jejunal branches. Arteriography demonstrates no evidence of active bleeding from the small bowel. Additionally, arteriography as the catheter was pulled back into the SMA confirms that the middle colic artery does not arise from this arterial trunk. Continued interrogations of the SMA from a multiple angles and obliquities was performed. The origin of the middle colic artery overlaps the origin of several other arteries and could not be catheterized. Ultimately, the microcatheter was advanced into a small proximal artery. Arteriography demonstrates that this is an accessory middle colic artery providing arterial supply to the mid segment of the transverse colon. There is no evidence of active bleeding or vascular abnormality. Finally, the microcatheter was advanced into an arterial branch arising from the apparent site of the origin of the dominant middle colic artery. However, arteriography confirms that this is in fact another jejunal branch. No evidence of active bleeding from this arterial trunk. No communication with the middle colic artery. At this point, the microcatheter was removed. One final arteriogram was performed through the 5 French catheter at the SMA providing excellent opacification of the sigmoid colon. Again, no evidence of active bleeding. No further angiography was performed. The catheters were removed. Hemostasis was attained with the assistance of a 5 [place] Exoseal. IMPRESSION: 1. Negative multi selective visceral angiogram. No evidence of active GI bleeding. 2. Extremely tortuous vascular anatomy secondary to prior stent graft repair of the abdominal aorta and embolization of the origin of the inferior mesenteric artery resulting in collateralization through the SMA. Further angiography is unlikely to be of benefit. PLAN: Further angiography is unlikely to be of benefit.  Recommend supportive care with serial  evaluation of the hemoglobin and hematocrit and transfusion as necessary. Signed, Criselda Peaches, MD, [place] [facility] Electronically Signed   By: Jacqulynn Cadet M.D.   On: 03/23/2020 08:28   IR US Guide Vasc Access Right  Result Date: 03/23/2020 INDICATION: 86 year old male with a history of abdominal aortic aneurysm status post endovascular aortic repair. He presents with active lower GI bleeding. CTA of the abdomen and pelvis performed earlier today demonstrated a focus of active bleeding in the proximal descending colon at 15:22 p.m. Patient now presents for arteriogram and possible embolization. EXAM: SELECTIVE VISCERAL ARTERIOGRAPHY; ADDITIONAL ARTERIOGRAPHY; IR ULTRASOUND GUIDANCE VASC ACCESS RIGHT 1. Ultrasound-guided vascular access right common femoral artery 2. Catheterization of the celiac axis with arteriogram 3. Catheterization of the SMA with arteriogram 4. Catheterization of jejunal branch with arteriogram 5. Catheterization of accessory middle colic artery with arteriogram 6. Catheterization of 2nd jejunal branch with arteriogram MEDICATIONS: None ANESTHESIA/SEDATION: None CONTRAST:  65mL OMNIPAQUE IOHEXOL 300 MG/ML SOLN, 67mL OMNIPAQUE IOHEXOL 300 MG/ML SOLN, 13mL OMNIPAQUE IOHEXOL 300 MG/ML SOLN FLUOROSCOPY TIME:  Fluoroscopy Time: 17 minutes 24 seconds (1690 mGy). COMPLICATIONS: None immediate. PROCEDURE: Informed consent was obtained from the patient following explanation of the procedure, risks, benefits and alternatives. The patient understands, agrees and consents for the procedure. All questions were addressed. A time out was performed prior to the initiation of the procedure. Maximal barrier sterile technique utilized including caps, mask, sterile gowns, sterile gloves, large sterile drape, hand hygiene, and Betadine prep. The right common femoral artery was interrogated with ultrasound and found to be widely patent. An  image was obtained and stored for the medical record. Local anesthesia was attained by infiltration with 1% lidocaine. A small dermatotomy was made. Under real-time sonographic guidance, the vessel was punctured with a 21 gauge micropuncture needle. Using standard technique, the initial micro needle was exchanged over a 0.018 micro wire for a transitional 4 [place] micro sheath. The micro sheath was then exchanged over a 0.035 wire for a 5 French vascular sheath. A C2 cobra catheter was advanced over a Bentson wire into the abdominal aorta. The catheter was used to select the celiac axis. An arteriogram was performed. Celiac arterial anatomy is normal. No evidence of replaced middle colic artery. No evidence of active hemorrhage. The catheter was next used to select the origin of the superior mesenteric artery. Digital subtraction angiography was performed. The middle colic artery is relatively hypertrophic in provide supply to the region of bleeding seen on the recent CT arteriogram. There is no evidence of active extravasation at the splenic flexure or within the descending colon at this time. In an effort to investigate further, a renegade STC microcatheter was advanced coaxially through the 5 French catheter over a Fathom 16 wire. For the next 15 minutes, extensive attempts were made to catheterize the middle colic artery without success. Other vessels catheterized and interrogated included the common trunk of 2 proximal jejunal branches. Arteriography demonstrates no evidence of active bleeding from the small bowel. Additionally, arteriography as the catheter was pulled back into the SMA confirms that the middle colic artery does not arise from this arterial trunk. Continued interrogations of the SMA from a multiple angles and obliquities was performed. The origin of the middle colic artery overlaps the origin of several other arteries and could not be catheterized. Ultimately, the microcatheter was advanced  into a small proximal artery. Arteriography demonstrates that this is an accessory  middle colic artery providing arterial supply to the mid segment of the transverse colon. There is no evidence of active bleeding or vascular abnormality. Finally, the microcatheter was advanced into an arterial branch arising from the apparent site of the origin of the dominant middle colic artery. However, arteriography confirms that this is in fact another jejunal branch. No evidence of active bleeding from this arterial trunk. No communication with the middle colic artery. At this point, the microcatheter was removed. One final arteriogram was performed through the 5 French catheter at the SMA providing excellent opacification of the sigmoid colon. Again, no evidence of active bleeding. No further angiography was performed. The catheters were removed. Hemostasis was attained with the assistance of a 5 [place] Exoseal. IMPRESSION: 1. Negative multi selective visceral angiogram. No evidence of active GI bleeding. 2. Extremely tortuous vascular anatomy secondary to prior stent graft repair of the abdominal aorta and embolization of the origin of the inferior mesenteric artery resulting in collateralization through the SMA. Further angiography is unlikely to be of benefit. PLAN: Further angiography is unlikely to be of benefit. Recommend supportive care with serial evaluation of the hemoglobin and hematocrit and transfusion as necessary. Signed, Criselda Peaches, MD, [place] [facility] Radiology Electronically Signed   By: Jacqulynn Cadet M.D.   On: 03/23/2020 08:28   CT Angio Abd/Pel w/ and/or w/o  Result Date: 03/22/2020 CLINICAL DATA:  86 year old with rectal bleeding. EXAM: CT ANGIOGRAPHY ABDOMEN AND PELVIS WITH CONTRAST AND WITHOUT CONTRAST TECHNIQUE: Multidetector CT imaging of the abdomen and pelvis was performed using the standard protocol during bolus administration of  intravenous contrast. Multiplanar reconstructed images and MIPs were obtained and reviewed to evaluate the vascular anatomy. CONTRAST:  29mL OMNIPAQUE IOHEXOL 350 MG/ML SOLN COMPARISON:  CT abdomen and pelvis 06/05/2017 FINDINGS: VASCULAR Aorta: Patient has undergone endovascular repair of an abdominal aortic aneurysm with bifurcated aortic stent graft. The aorta and stent graft are patent. The native aortic aneurysm sac measures up to 4.8 cm and measured roughly 4.6 cm in 2018. There is evidence for an endoleak on the venous phase of imaging. There is contrast within the aneurysm sac posterior to the graft on sequence 13, image 47. 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 Celiac: Celiac trunk is patent without significant stenosis. SMA: Minimal plaque at the origin of the SMA without significant stenosis. Evidence for active contrast extravasation within the descending colon on sequence 6, image 69. There is accumulation of contrast in the same area on the delayed images on sequence 13, image 27. Findings are compatible with active GI bleeding. Renals: Bilateral renal arteries are patent without significant stenosis. Small accessory left renal artery. Aortic stent graft is positioned below the renal arteries. IMA: Embolization coils at the origin of the IMA likely related to previous type 2 leak treatment. Reconstitution of the distal IMA branches. Inflow: Bifurcated aortic stent graft extends into the common iliac arteries bilaterally. Both limbs are widely patent. There is flow in the internal and external iliac arteries bilaterally. Proximal Outflow: Proximal femoral arteries are patent bilaterally. Veins: Patent veins are patent. Main portal venous system is patent. Renal veins are patent. No gross abnormality to the iliac veins. Review of the MIP images confirms the above findings. NON-VASCULAR Lower chest: Prominent interstitial lung markings bilaterally  suggestive for chronic changes and areas of scarring and fibrosis. No pleural effusions. Hepatobiliary: Mild dilatation of left hepatic bile ducts is  unchanged. There may be some dilated peripheral bile ducts in the right hepatic lobe which are chronic. No acute abnormality to the liver or gallbladder. Pancreas: Mild dilatation of the main pancreatic duct is chronic. No evidence for pancreatic inflammation. Spleen: Spleen is small for size and stable. Adrenals/Urinary Tract: Normal appearance of the adrenal glands. No hydronephrosis. No suspicious renal lesions. There is evidence for a diverticulum involving the right posterior aspect of the urinary bladder. There is high-density material near the diverticulum that is suspicious for an enhancing lesion measuring roughly 2.3 cm. Stomach/Bowel: Rectum is mildly distended with gas and fluid. Extensive diverticulosis in the descending colon and proximal sigmoid colon region. Evidence for active GI bleeding involving the descending colon along the left lateral abdomen. No other area suspicious for active GI bleeding. Normal appearance of the stomach. Lymphatic: No abdominal or pelvic lymphadenopathy. Reproductive: Prostate is unremarkable. Other: Negative for free fluid. There is probably some fluid within the lower inguinal canals. Musculoskeletal: No acute bone abnormality. Mild anterolisthesis at L4-L5. IMPRESSION: VASCULAR 1. Positive for active GI bleeding in the left colon. 2. Endovascular repair of an abdominal aortic aneurysm. The aortic stent graft is patent. 3. Aortic aneurysm sac has slightly enlarged since 2018 and there is a type 2 endoleak on the delayed images. NON-VASCULAR 1. Enhancing lesion along the right side of the bladder. 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 Recommend urology consultation. 2. Colonic diverticulosis. 3. Question some fluid within the inguinal canals. These results were called by telephone at the time of  interpretation on 03/22/2020 at 3:19 pm to provider Dr. Horris Latino, who verbally acknowledged these results. Electronically Signed   By: Markus Daft M.D.   On: 03/22/2020 15:22   Assessment / Plan: *GI bleeding: Presumed diverticular from colonic diverticulosis noted on Dr. Benson Norway colonoscopy in 2018.  Has had multiple episodes of the same, last 06/2019.  Says that this is the exact same bleeding that he has had on other occasions when it was determined to be diverticular.  CTA positive for left sided bleeding yesterday but then angiogram negative. *ABLA:  Hgb down from 11.7 grams yesterday to 8.4 grams this AM. *History of bleeding ulcer in 2016 while on full dose aspirin. *Pulmonary hypertension, interstitial lung disease, COPD:  On oxygen,sildenafil. *Persistent afib, CAD:  Not on anticoagulation due to history of GI bleeding.  -Monitor Hgb and transfuse prn.   -Hopefully will resolve without intervention.  Per IR further angiography unlikely to be of benefit. -Colonoscopy only if bleeding continues.   LOS: 0 days   Laban Emperor. Zehr  03/23/2020, 9:01 AM    Attending physician's note   I have taken an interval history, reviewed the chart and examined the patient. I agree with the Advanced Practitioner's note, impression and recommendations.   CT angio positive for left colon diverticular bleed with active extravasation but subsequent angio done by IR prior to embolization was negative, empiric embolization was not done due to difficult anatomy with history of TAVR  He passed some old blood this morning otherwise hemodynamically stable.  No evidence of ongoing bleeding  Continue to monitor hemoglobin twice daily PRBC transfusion if hemoglobin less than 7 IV Feraheme X2  Continue diet as tolerated If no further bleeding the next 24 to 48 hours, okay to discharge home. Will plan for colonoscopy to intervene only if he starts actively bleeding again  GI will continue to follow  K. Denzil Magnuson , MD [phone]

## 2020-03-24 DIAGNOSIS — K5731 Diverticulosis of large intestine without perforation or abscess with bleeding: Secondary | ICD-10-CM

## 2020-03-24 LAB — GLUCOSE, CAPILLARY
Glucose-Capillary: 111 mg/dL — ABNORMAL HIGH (ref 70–99)
Glucose-Capillary: 112 mg/dL — ABNORMAL HIGH (ref 70–99)
Glucose-Capillary: 112 mg/dL — ABNORMAL HIGH (ref 70–99)
Glucose-Capillary: 120 mg/dL — ABNORMAL HIGH (ref 70–99)

## 2020-03-24 LAB — HEMOGLOBIN AND HEMATOCRIT, BLOOD
HCT: 22.5 % — ABNORMAL LOW (ref 39.0–52.0)
Hemoglobin: 7.2 g/dL — ABNORMAL LOW (ref 13.0–17.0)

## 2020-03-24 LAB — PREPARE RBC (CROSSMATCH)

## 2020-03-24 MED ORDER — SODIUM CHLORIDE 0.9 % IV SOLN
500.0000 mg | Freq: Once | INTRAVENOUS | Status: DC
  Filled 2020-03-24: qty 10

## 2020-03-24 MED ORDER — SODIUM CHLORIDE 0.9 % IV SOLN
25.0000 mg | Freq: Once | INTRAVENOUS | Status: AC
  Administered 2020-03-24: 25 mg via INTRAVENOUS
  Filled 2020-03-24: qty 0.5

## 2020-03-24 MED ORDER — SODIUM CHLORIDE 0.9% IV SOLUTION: Freq: Once | INTRAVENOUS | Status: AC

## 2020-03-24 MED ORDER — SODIUM CHLORIDE 0.9 % IV SOLN
1000.0000 mg | Freq: Once | INTRAVENOUS | Status: AC
  Administered 2020-03-24: 1000 mg via INTRAVENOUS
  Filled 2020-03-24 (×2): qty 20

## 2020-03-24 MED ORDER — SODIUM CHLORIDE 0.9 % IV SOLN
25.0000 mg | Freq: Once | INTRAVENOUS | Status: DC
  Filled 2020-03-24: qty 0.5

## 2020-03-24 NOTE — Evaluation (Signed)
Occupational Therapy Evaluation Patient Details Name: Vincent Robinson MRN: [id number] DOB: 01-Oct-1923 Today's Date: 03/24/2020    History of Present Illness 86 year old male who presented with rectal bleeding.  He does have significant past medical history for severe pulmonary hypertension with cor pulmonale, chronic diastolic heart failure, atrial fibrillation and coronary artery disease   Clinical Impression   Vincent Robinson is a 86 year old man admitted to the hospital with rectal bleeding. On evaluation he demonstrates normal ROM and strength of upper extremities, ability to perform his ADLs and perform functional mobility. Patient on 3L HFNC and oxygen maintained between 88-94% during evaluation. Patient reports no needs and feels like he is at his baseline.     Follow Up Recommendations  No OT follow up    Equipment Recommendations  None recommended by OT    Recommendations for Other Services       Precautions / Restrictions Precautions Precautions: None Restrictions Weight Bearing Restrictions: No      Mobility Bed Mobility               General bed mobility comments: independent to transfer to side of bed.  Transfers Overall transfer level: Independent               General transfer comment: Independent to stand and transfer to the recilner. Reports ambulating to bathroom without device with nursing.    Balance Overall balance assessment: No apparent balance deficits (not formally assessed)                                         ADL either performed or assessed with clinical judgement   ADL Overall ADL's : Needs assistance/impaired Eating/Feeding: Independent   Grooming: Wash/dry hands;Independent   Upper Body Bathing: Supervision/ safety   Lower Body Bathing: Supervison/ safety   Upper Body Dressing : Supervision/safety   Lower Body Dressing: Supervision/safety   Toilet Transfer: Min guard   Toileting- Clothing  Manipulation and Hygiene: Min guard               Vision Baseline Vision/History: No visual deficits       Perception     Praxis      Pertinent Vitals/Pain Pain Assessment: No/denies pain     Hand Dominance Right   Extremity/Trunk Assessment Upper Extremity Assessment Upper Extremity Assessment: Overall WFL for tasks assessed   Lower Extremity Assessment Lower Extremity Assessment: Defer to PT evaluation   Cervical / Trunk Assessment Cervical / Trunk Assessment: Normal   Communication Communication Communication: No difficulties   Cognition Arousal/Alertness: Awake/alert Behavior During Therapy: WFL for tasks assessed/performed Overall Cognitive Status: Within Functional Limits for tasks assessed                                     General Comments       Exercises     Shoulder Instructions      Home Living Family/patient expects to be discharged to:: Private residence Living Arrangements: Spouse/significant other Available Help at Discharge: Family;Available 24 hours/day   Home Access: Level entry     Home Layout: One level     Bathroom Shower/Tub: Tub/shower unit;Walk-in shower   Bathroom Toilet: Standard Bathroom Accessibility: Yes   Home Equipment: Walker - 2 wheels;Cane - single point  Prior Functioning/Environment Level of Independence: Independent        Comments: Reports he has a walker but doesn't use it.        OT Problem List:        OT Treatment/Interventions:      OT Goals(Current goals can be found in the care plan section) Acute Rehab OT Goals OT Goal Formulation: All assessment and education complete, DC therapy  OT Frequency:     Barriers to D/C:            Co-evaluation              AM-PAC OT "6 Clicks" Daily Activity     Outcome Measure Help from another person eating meals?: None Help from another person taking care of personal grooming?: None Help from another person  toileting, which includes using toliet, bedpan, or urinal?: None Help from another person bathing (including washing, rinsing, drying)?: None Help from another person to put on and taking off regular upper body clothing?: None Help from another person to put on and taking off regular lower body clothing?: None 6 Click Score: 24   End of Session Nurse Communication: Mobility status  Activity Tolerance: Patient tolerated treatment well Patient left: in chair;with call bell/phone within reach  OT Visit Diagnosis: Muscle weakness (generalized) (M62.81)                Time: 1355-1415 OT Time Calculation (min): 20 min Charges:  OT General Charges $OT Visit: 1 Visit OT Evaluation $OT Eval Low Complexity: 1 Low  Edin Skarda, OTR/L Acute Care Rehab Services  Office [phone]   Lenward Chancellor 03/24/2020, 2:44 PM

## 2020-03-24 NOTE — Progress Notes (Signed)
Pharmacy - IV iron replacement  Assessment: 83 yoM with anemia d/t GIB, 5g drop in hemoglobin. Patient also noted to be iron deficient in Feb of this year. Pharmacy consulted to replace iron intravenously.  Plan:  Iron Dextran 25 mg IV test dose x 1  If tolerated, proceed with Iron dextran 1000 mg IV x 1  Pharmacy will follow for tolerability  Reuel Boom, PharmD, BCPS [phone] 03/24/2020, 10:16 AM

## 2020-03-24 NOTE — Progress Notes (Signed)
Pt feels he left wallet in ED on day of admission. He has called down to ED and spoke with charge RN x2; unable to locate wallet. Also spoke with security who states no wallet has been turned in. Writer called ED this evening with same complaint and no wallet found since last time pt called.

## 2020-03-24 NOTE — Progress Notes (Signed)
PROGRESS NOTE    Vincent Robinson  [id number] DOB: 1923/09/25 DOA: 03/21/2020 PCP: Katherina Mires, MD    Brief Narrative:  Patient admitted to the hospital with a working diagnosis of acute blood loss anemia due to lower GI bleed/diverticular bleed.  86 year old male who presented with rectal bleeding.  He does have significant past medical history for severe pulmonary hypertension with cor pulmonale, chronic diastolic heart failure, atrial fibrillation and coronary artery disease.  Patient reported 2 large bloody bowel movements, no nausea, no vomiting and no fevers.  On his initial physical examination blood pressure 123/68, heart rate 74, respiratory 21, oxygen saturation 95%, his lungs are clear to auscultation bilaterally, heart S1-S2 present rhythmic, soft abdomen, no lower extremity edema.  Patient has remained hemodynamically stable, further work-up with CT of the abdomen pelvis show active bleeding in the left colon.  IR was consulted, angiogram was negative for active bleeding.  Not candidate for embolization, chronic occlusion of the IMA.  Patient's hemoglobin has slowly trended down, ordered today one unit PRBC and one dose of IV iron.    Assessment & Plan:   Principal Problem:   Rectal bleeding Active Problems:   COPD (chronic obstructive pulmonary disease) (HCC)   ILD (interstitial lung disease) ([place])   Pulmonary hypertension, unspecified (HCC)   Aortic stenosis, moderate   S/P CABG (coronary artery bypass graft)   Acute GI bleeding   1. Acute blood loss anemia due to lower  GI bleed, diverticular bleed/ iron deficiency anemia CT abdomen with active bleeding in the left colon. Angiography with no signs of bleeding and difficult vascular anatomy for embolization.   Patient continue with no abdominal pain, or frank melena or hematochezia, his hemoglobin today is down to 7,2 from 8.4 with Hct 22,5. Will order one unit PRBC today and and one dose of IV iron.   Follow  cell count in am.   2. Severe pulmonary HTN with chronic core pulmonale. Blood pressure has been stable low, this am 96/52, will continue target a MAP more than 60 as long patient is asymptomatic. Continue with macitentan and sildenafil.   Out of bed to chair tid with meals and PT and OT recommendations  3. COPD and ILD. No active  dyspnea or signs of acute exacerbation. Increased mobility.   Continue with Dulera.   4. CAD sp CABG/ Aortic aneurysm sp graft/ dyslipidemia. Positive endoleak, continue blood pressure monitoring, for now holding on antiplatelet therapy.  Continue with statin therapy, simvastatin 10 mg daily.   5. Hypothyroid. Continue with levothyroxine.    Status is: Inpatient  Remains inpatient appropriate because:IV treatments appropriate due to intensity of illness or inability to take PO   Dispo: The patient is from: Home              Anticipated d/c is to: Home              Anticipated d/c date is: 1 day              Patient currently is not medically stable to d/c.   DVT prophylaxis: scd   Code Status:   Partial  Family Communication:  No family at the bedside       Consultants:   GI       Subjective: Patient is feeling better, continue to be weak and deconditioned, no nausea or vomiting and tolerating po well. Positive worsening anemia,   Objective: Vitals:   03/24/20 0543 03/24/20 [id number] 03/24/20 0850 03/24/20 1025  BP: (!) 89/46   (!) 84/40  Pulse: (!) 57   75  Resp: 18   (!) 22  Temp: 98 F (36.7 C)   (!) 97.5 F (36.4 C)  TempSrc: Oral   Oral  SpO2: 96% 91% 91% 93%  Weight:      Height:        Intake/Output Summary (Last 24 hours) at 03/24/2020 1050 Last data filed at 03/24/2020 1034 Gross per 24 hour  Intake 595 ml  Output 625 ml  Net -30 ml   Filed Weights   03/22/20 2019  Weight: 76.7 kg    Examination:   General: Not in pain or dyspnea, deconditioned  Neurology: Awake and alert, non focal  E ENT: no pallor, no  icterus, oral mucosa moist Cardiovascular: No JVD. S1-S2 present, rhythmic, no gallops, rubs, or murmurs. No lower extremity edema. Pulmonary: positive breath sounds bilaterally, adequate air movement, no wheezing, rhonchi or rales. Gastrointestinal. Abdomen with no organomegaly, non tender, no rebound or guarding Skin. No rashes Musculoskeletal: no joint deformities     Data Reviewed: I have personally reviewed following labs and imaging studies  CBC: Recent Labs  Lab 03/22/20 0042 03/22/20 0042 03/22/20 0426 03/22/20 0426 03/22/20 0757 03/22/20 1244 03/22/20 1733 03/23/20 0542 03/24/20 0517  WBC 10.2   < > 11.3*  --  10.7* 12.4* 9.9 11.9*  --   NEUTROABS 6.2  --   --   --   --   --   --   --   --   HGB 12.6*   < > 11.1*   < > 11.0* 11.7* 8.7* 8.4* 7.2*  HCT 38.6*   < > 33.9*   < > 33.3* 35.7* 27.0* 26.3* 22.5*  MCV 96.0   < > 96.9  --  97.4 97.8 98.5 98.1  --   PLT 315   < > 272  --  276 340 253 241  --    < > = values in this interval not displayed.   Basic Metabolic Panel: Recent Labs  Lab 03/22/20 0042 03/22/20 0426 03/23/20 0542  NA 140 139 138  K 3.9 3.9 4.0  CL 102 105 104  CO2 28 25 23   GLUCOSE 112* 107* 103*  BUN 23 23 28*  CREATININE 1.39* 1.24 0.99  CALCIUM 8.6* 8.0* 8.1*   GFR: Estimated Creatinine Clearance: 42.7 mL/min (by C-G formula based on SCr of 0.99 mg/dL). Liver Function Tests: No results for input(s): AST, ALT, ALKPHOS, BILITOT, PROT, ALBUMIN in the last 168 hours. No results for input(s): LIPASE, AMYLASE in the last 168 hours. No results for input(s): AMMONIA in the last 168 hours. Coagulation Profile: Recent Labs  Lab 03/22/20 2008  INR 1.2   Cardiac Enzymes: No results for input(s): CKTOTAL, CKMB, CKMBINDEX, TROPONINI in the last 168 hours. BNP (last 3 results) No results for input(s): PROBNP in the last 8760 hours. HbA1C: No results for input(s): HGBA1C in the last 72 hours. CBG: Recent Labs  Lab 03/23/20 0115  03/23/20 0753 03/23/20 1657 03/24/20 0019 03/24/20 0800  GLUCAP 98 95 135* 112* 112*   Lipid Profile: No results for input(s): CHOL, HDL, LDLCALC, TRIG, CHOLHDL, LDLDIRECT in the last 72 hours. Thyroid Function Tests: No results for input(s): TSH, T4TOTAL, FREET4, T3FREE, THYROIDAB in the last 72 hours. Anemia Panel: No results for input(s): VITAMINB12, FOLATE, FERRITIN, TIBC, IRON, RETICCTPCT in the last 72 hours.    Radiology Studies: I have reviewed all of the imaging during this hospital  visit personally     Scheduled Meds: . levothyroxine  112 mcg Oral Q0600  . macitentan  10 mg Oral Daily  . midodrine  10 mg Oral TID with meals  . mometasone-formoterol  2 puff Inhalation BID  . pantoprazole  20 mg Oral BID  . sildenafil  40 mg Oral TID  . simvastatin  10 mg Oral QHS   Continuous Infusions: . iron dextran (INFED/DEXFERRUM) infusion     Followed by  . iron dextran (INFED/DEXFERRUM) infusion       LOS: 1 day        Wakisha Alberts Gerome Apley, MD

## 2020-03-24 NOTE — Plan of Care (Signed)

## 2020-03-24 NOTE — Plan of Care (Addendum)
Pt remains able to use urinal for voiding needs; expected to start using BSC once blood transfusion completed.

## 2020-03-24 NOTE — Progress Notes (Addendum)
Progress Note  CC:  GI bleed     ASSESSMENT AND PLAN:   86 yo male with multiple medical problems not limited to severe pulmonary HTN / diastolic heart failure, Afib, hypothyroidism, COPD  # GI bleed, presumably recurrent diverticular hemorrhage.  --CTA positive for left sided bleeding but angiogram negative. Empiric embolization not done due to difficult anatomy given TAVR history.   # Acute blood loss anemia.  --Hgb down from 8.4 to 7.2 yesterday, overall down ~ 5 grams this admission. May need unit of blood if continues to decline but should stabilize in absence of bleeding.   --We recommended IV iron, doesn't appeared to have been ordered so will order today. On oral iron.  --Hopefully home in am     SUBJECTIVE   Feels okay. No bleeding or BMs today. Tolerating fluids but appetite is not great.     OBJECTIVE:     Vital signs in last 24 hours: Temp:  [97.5 F (36.4 C)-98 F (36.7 C)] 98 F (36.7 C) (06/03 0543) Pulse Rate:  [57-85] 57 (06/03 0543) Resp:  [16-18] 18 (06/03 0543) BP: (89-95)/(46-58) 89/46 (06/03 0543) SpO2:  [90 %-96 %] 96 % (06/03 0543) Last BM Date: 03/21/20 General:   Alert, in NAD Heart:  Regular rate and rhythm. Loud murmur.  No lower extremity edema   Pulm: Normal respiratory effort   Abdomen:  Soft,  nontender, nondistended.  Normal bowel sounds.          Neurologic:  Alert and  oriented,  grossly normal neurologically. Psych:  Pleasant, cooperative.  Normal mood and affect.   Intake/Output from previous day: 06/02 0701 - 06/03 0700 In: 510 [P.O.:510] Out: 750 [Urine:750] Intake/Output this shift: No intake/output data recorded.  Lab Results: Recent Labs    03/22/20 1244 03/22/20 1244 03/22/20 1733 03/23/20 0542 03/24/20 0517  WBC 12.4*  --  9.9 11.9*  --   HGB 11.7*   < > 8.7* 8.4* 7.2*  HCT 35.7*   < > 27.0* 26.3* 22.5*  PLT 340  --  253 241  --    < > = values in this interval not displayed.   BMET Recent Labs   03/22/20 0042 03/22/20 0426 03/23/20 0542  NA 140 139 138  K 3.9 3.9 4.0  CL 102 105 104  CO2 28 25 23   GLUCOSE 112* 107* 103*  BUN 23 23 28*  CREATININE 1.39* 1.24 0.99  CALCIUM 8.6* 8.0* 8.1*   LFT No results for input(s): PROT, ALBUMIN, AST, ALT, ALKPHOS, BILITOT, BILIDIR, IBILI in the last 72 hours. PT/INR Recent Labs    03/22/20 2008  LABPROT 14.9  INR 1.2   Hepatitis Panel No results for input(s): HEPBSAG, HCVAB, HEPAIGM, HEPBIGM in the last 72 hours.  IR Angiogram Visceral Selective  Result Date: 03/23/2020 INDICATION: 86 year old male with a history of abdominal aortic aneurysm status post endovascular aortic repair. He presents with active lower GI bleeding. CTA of the abdomen and pelvis performed earlier today demonstrated a focus of active bleeding in the proximal descending colon at 15:22 p.m. Patient now presents for arteriogram and possible embolization. EXAM: SELECTIVE VISCERAL ARTERIOGRAPHY; ADDITIONAL ARTERIOGRAPHY; IR ULTRASOUND GUIDANCE VASC ACCESS RIGHT 1. Ultrasound-guided vascular access right common femoral artery 2. Catheterization of the celiac axis with arteriogram 3. Catheterization of the SMA with arteriogram 4. Catheterization of jejunal branch with arteriogram 5. Catheterization of accessory middle colic artery with arteriogram 6. Catheterization of 2nd jejunal branch with arteriogram MEDICATIONS: None ANESTHESIA/SEDATION:  None CONTRAST:  57mL OMNIPAQUE IOHEXOL 300 MG/ML SOLN, 28mL OMNIPAQUE IOHEXOL 300 MG/ML SOLN, 3mL OMNIPAQUE IOHEXOL 300 MG/ML SOLN FLUOROSCOPY TIME:  Fluoroscopy Time: 17 minutes 24 seconds (1690 mGy). COMPLICATIONS: None immediate. PROCEDURE: Informed consent was obtained from the patient following explanation of the procedure, risks, benefits and alternatives. The patient understands, agrees and consents for the procedure. All questions were addressed. A time out was performed prior to the initiation of the procedure. Maximal barrier  sterile technique utilized including caps, mask, sterile gowns, sterile gloves, large sterile drape, hand hygiene, and Betadine prep. The right common femoral artery was interrogated with ultrasound and found to be widely patent. An image was obtained and stored for the medical record. Local anesthesia was attained by infiltration with 1% lidocaine. A small dermatotomy was made. Under real-time sonographic guidance, the vessel was punctured with a 21 gauge micropuncture needle. Using standard technique, the initial micro needle was exchanged over a 0.018 micro wire for a transitional 4 [place] micro sheath. The micro sheath was then exchanged over a 0.035 wire for a 5 French vascular sheath. A C2 cobra catheter was advanced over a Bentson wire into the abdominal aorta. The catheter was used to select the celiac axis. An arteriogram was performed. Celiac arterial anatomy is normal. No evidence of replaced middle colic artery. No evidence of active hemorrhage. The catheter was next used to select the origin of the superior mesenteric artery. Digital subtraction angiography was performed. The middle colic artery is relatively hypertrophic in provide supply to the region of bleeding seen on the recent CT arteriogram. There is no evidence of active extravasation at the splenic flexure or within the descending colon at this time. In an effort to investigate further, a renegade STC microcatheter was advanced coaxially through the 5 French catheter over a Fathom 16 wire. For the next 15 minutes, extensive attempts were made to catheterize the middle colic artery without success. Other vessels catheterized and interrogated included the common trunk of 2 proximal jejunal branches. Arteriography demonstrates no evidence of active bleeding from the small bowel. Additionally, arteriography as the catheter was pulled back into the SMA confirms that the middle colic artery does not arise from this arterial trunk. Continued  interrogations of the SMA from a multiple angles and obliquities was performed. The origin of the middle colic artery overlaps the origin of several other arteries and could not be catheterized. Ultimately, the microcatheter was advanced into a small proximal artery. Arteriography demonstrates that this is an accessory middle colic artery providing arterial supply to the mid segment of the transverse colon. There is no evidence of active bleeding or vascular abnormality. Finally, the microcatheter was advanced into an arterial branch arising from the apparent site of the origin of the dominant middle colic artery. However, arteriography confirms that this is in fact another jejunal branch. No evidence of active bleeding from this arterial trunk. No communication with the middle colic artery. At this point, the microcatheter was removed. One final arteriogram was performed through the 5 French catheter at the SMA providing excellent opacification of the sigmoid colon. Again, no evidence of active bleeding. No further angiography was performed. The catheters were removed. Hemostasis was attained with the assistance of a 5 [place] Exoseal. IMPRESSION: 1. Negative multi selective visceral angiogram. No evidence of active GI bleeding. 2. Extremely tortuous vascular anatomy secondary to prior stent graft repair of the abdominal aorta and embolization of the origin of the inferior mesenteric artery resulting in collateralization through the  SMA. Further angiography is unlikely to be of benefit. PLAN: Further angiography is unlikely to be of benefit. Recommend supportive care with serial evaluation of the hemoglobin and hematocrit and transfusion as necessary. Signed, Criselda Peaches, MD, [facility] Radiology Electronically Signed   By: Jacqulynn Cadet M.D.   On: 03/23/2020 08:28   IR Angiogram Selective Each Additional Vessel  Result Date:  03/23/2020 INDICATION: 86 year old male with a history of abdominal aortic aneurysm status post endovascular aortic repair. He presents with active lower GI bleeding. CTA of the abdomen and pelvis performed earlier today demonstrated a focus of active bleeding in the proximal descending colon at 15:22 p.m. Patient now presents for arteriogram and possible embolization. EXAM: SELECTIVE VISCERAL ARTERIOGRAPHY; ADDITIONAL ARTERIOGRAPHY; IR ULTRASOUND GUIDANCE VASC ACCESS RIGHT 1. Ultrasound-guided vascular access right common femoral artery 2. Catheterization of the celiac axis with arteriogram 3. Catheterization of the SMA with arteriogram 4. Catheterization of jejunal branch with arteriogram 5. Catheterization of accessory middle colic artery with arteriogram 6. Catheterization of 2nd jejunal branch with arteriogram MEDICATIONS: None ANESTHESIA/SEDATION: None CONTRAST:  45mL OMNIPAQUE IOHEXOL 300 MG/ML SOLN, 58mL OMNIPAQUE IOHEXOL 300 MG/ML SOLN, 80mL OMNIPAQUE IOHEXOL 300 MG/ML SOLN FLUOROSCOPY TIME:  Fluoroscopy Time: 17 minutes 24 seconds (1690 mGy). COMPLICATIONS: None immediate. PROCEDURE: Informed consent was obtained from the patient following explanation of the procedure, risks, benefits and alternatives. The patient understands, agrees and consents for the procedure. All questions were addressed. A time out was performed prior to the initiation of the procedure. Maximal barrier sterile technique utilized including caps, mask, sterile gowns, sterile gloves, large sterile drape, hand hygiene, and Betadine prep. The right common femoral artery was interrogated with ultrasound and found to be widely patent. An image was obtained and stored for the medical record. Local anesthesia was attained by infiltration with 1% lidocaine. A small dermatotomy was made. Under real-time sonographic guidance, the vessel was punctured with a 21 gauge micropuncture needle. Using standard technique, the initial micro needle was  exchanged over a 0.018 micro wire for a transitional 4 [place] micro sheath. The micro sheath was then exchanged over a 0.035 wire for a 5 French vascular sheath. A C2 cobra catheter was advanced over a Bentson wire into the abdominal aorta. The catheter was used to select the celiac axis. An arteriogram was performed. Celiac arterial anatomy is normal. No evidence of replaced middle colic artery. No evidence of active hemorrhage. The catheter was next used to select the origin of the superior mesenteric artery. Digital subtraction angiography was performed. The middle colic artery is relatively hypertrophic in provide supply to the region of bleeding seen on the recent CT arteriogram. There is no evidence of active extravasation at the splenic flexure or within the descending colon at this time. In an effort to investigate further, a renegade STC microcatheter was advanced coaxially through the 5 French catheter over a Fathom 16 wire. For the next 15 minutes, extensive attempts were made to catheterize the middle colic artery without success. Other vessels catheterized and interrogated included the common trunk of 2 proximal jejunal branches. Arteriography demonstrates no evidence of active bleeding from the small bowel. Additionally, arteriography as the catheter was pulled back into the SMA confirms that the middle colic artery does not arise from this arterial trunk. Continued interrogations of the SMA from a multiple angles and obliquities was performed. The origin of the middle colic artery overlaps the origin of several other arteries and could not be catheterized.  Ultimately, the microcatheter was advanced into a small proximal artery. Arteriography demonstrates that this is an accessory middle colic artery providing arterial supply to the mid segment of the transverse colon. There is no evidence of active bleeding or vascular abnormality. Finally, the microcatheter was advanced into an arterial branch  arising from the apparent site of the origin of the dominant middle colic artery. However, arteriography confirms that this is in fact another jejunal branch. No evidence of active bleeding from this arterial trunk. No communication with the middle colic artery. At this point, the microcatheter was removed. One final arteriogram was performed through the 5 French catheter at the SMA providing excellent opacification of the sigmoid colon. Again, no evidence of active bleeding. No further angiography was performed. The catheters were removed. Hemostasis was attained with the assistance of a 5 [place] Exoseal. IMPRESSION: 1. Negative multi selective visceral angiogram. No evidence of active GI bleeding. 2. Extremely tortuous vascular anatomy secondary to prior stent graft repair of the abdominal aorta and embolization of the origin of the inferior mesenteric artery resulting in collateralization through the SMA. Further angiography is unlikely to be of benefit. PLAN: Further angiography is unlikely to be of benefit. Recommend supportive care with serial evaluation of the hemoglobin and hematocrit and transfusion as necessary. Signed, Criselda Peaches, MD, [facility] Radiology Electronically Signed   By: Jacqulynn Cadet M.D.   On: 03/23/2020 08:28   IR US Guide Vasc Access Right  Result Date: 03/23/2020 INDICATION: 86 year old male with a history of abdominal aortic aneurysm status post endovascular aortic repair. He presents with active lower GI bleeding. CTA of the abdomen and pelvis performed earlier today demonstrated a focus of active bleeding in the proximal descending colon at 15:22 p.m. Patient now presents for arteriogram and possible embolization. EXAM: SELECTIVE VISCERAL ARTERIOGRAPHY; ADDITIONAL ARTERIOGRAPHY; IR ULTRASOUND GUIDANCE VASC ACCESS RIGHT 1. Ultrasound-guided vascular access right common femoral artery 2. Catheterization of the celiac  axis with arteriogram 3. Catheterization of the SMA with arteriogram 4. Catheterization of jejunal branch with arteriogram 5. Catheterization of accessory middle colic artery with arteriogram 6. Catheterization of 2nd jejunal branch with arteriogram MEDICATIONS: None ANESTHESIA/SEDATION: None CONTRAST:  54mL OMNIPAQUE IOHEXOL 300 MG/ML SOLN, 38mL OMNIPAQUE IOHEXOL 300 MG/ML SOLN, 41mL OMNIPAQUE IOHEXOL 300 MG/ML SOLN FLUOROSCOPY TIME:  Fluoroscopy Time: 17 minutes 24 seconds (1690 mGy). COMPLICATIONS: None immediate. PROCEDURE: Informed consent was obtained from the patient following explanation of the procedure, risks, benefits and alternatives. The patient understands, agrees and consents for the procedure. All questions were addressed. A time out was performed prior to the initiation of the procedure. Maximal barrier sterile technique utilized including caps, mask, sterile gowns, sterile gloves, large sterile drape, hand hygiene, and Betadine prep. The right common femoral artery was interrogated with ultrasound and found to be widely patent. An image was obtained and stored for the medical record. Local anesthesia was attained by infiltration with 1% lidocaine. A small dermatotomy was made. Under real-time sonographic guidance, the vessel was punctured with a 21 gauge micropuncture needle. Using standard technique, the initial micro needle was exchanged over a 0.018 micro wire for a transitional 4 [place] micro sheath. The micro sheath was then exchanged over a 0.035 wire for a 5 French vascular sheath. A C2 cobra catheter was advanced over a Bentson wire into the abdominal aorta. The catheter was used to select the celiac axis. An arteriogram was performed. Celiac arterial anatomy is normal. No evidence of replaced middle  colic artery. No evidence of active hemorrhage. The catheter was next used to select the origin of the superior mesenteric artery. Digital subtraction angiography was performed. The middle  colic artery is relatively hypertrophic in provide supply to the region of bleeding seen on the recent CT arteriogram. There is no evidence of active extravasation at the splenic flexure or within the descending colon at this time. In an effort to investigate further, a renegade STC microcatheter was advanced coaxially through the 5 French catheter over a Fathom 16 wire. For the next 15 minutes, extensive attempts were made to catheterize the middle colic artery without success. Other vessels catheterized and interrogated included the common trunk of 2 proximal jejunal branches. Arteriography demonstrates no evidence of active bleeding from the small bowel. Additionally, arteriography as the catheter was pulled back into the SMA confirms that the middle colic artery does not arise from this arterial trunk. Continued interrogations of the SMA from a multiple angles and obliquities was performed. The origin of the middle colic artery overlaps the origin of several other arteries and could not be catheterized. Ultimately, the microcatheter was advanced into a small proximal artery. Arteriography demonstrates that this is an accessory middle colic artery providing arterial supply to the mid segment of the transverse colon. There is no evidence of active bleeding or vascular abnormality. Finally, the microcatheter was advanced into an arterial branch arising from the apparent site of the origin of the dominant middle colic artery. However, arteriography confirms that this is in fact another jejunal branch. No evidence of active bleeding from this arterial trunk. No communication with the middle colic artery. At this point, the microcatheter was removed. One final arteriogram was performed through the 5 French catheter at the SMA providing excellent opacification of the sigmoid colon. Again, no evidence of active bleeding. No further angiography was performed. The catheters were removed. Hemostasis was attained with the  assistance of a 5 [place] Exoseal. IMPRESSION: 1. Negative multi selective visceral angiogram. No evidence of active GI bleeding. 2. Extremely tortuous vascular anatomy secondary to prior stent graft repair of the abdominal aorta and embolization of the origin of the inferior mesenteric artery resulting in collateralization through the SMA. Further angiography is unlikely to be of benefit. PLAN: Further angiography is unlikely to be of benefit. Recommend supportive care with serial evaluation of the hemoglobin and hematocrit and transfusion as necessary. Signed, Criselda Peaches, MD, [place] [facility] Radiology Electronically Signed   By: Jacqulynn Cadet M.D.   On: 03/23/2020 08:28   CT Angio Abd/Pel w/ and/or w/o  Result Date: 03/22/2020 CLINICAL DATA:  86 year old with rectal bleeding. EXAM: CT ANGIOGRAPHY ABDOMEN AND PELVIS WITH CONTRAST AND WITHOUT CONTRAST TECHNIQUE: Multidetector CT imaging of the abdomen and pelvis was performed using the standard protocol during bolus administration of intravenous contrast. Multiplanar reconstructed images and MIPs were obtained and reviewed to evaluate the vascular anatomy. CONTRAST:  107mL OMNIPAQUE IOHEXOL 350 MG/ML SOLN COMPARISON:  CT abdomen and pelvis 06/05/2017 FINDINGS: VASCULAR Aorta: Patient has undergone endovascular repair of an abdominal aortic aneurysm with bifurcated aortic stent graft. The aorta and stent graft are patent. The native aortic aneurysm sac measures up to 4.8 cm and measured roughly 4.6 cm in 2018. There is evidence for an endoleak on the venous phase of imaging. There is contrast within the aneurysm sac posterior to the graft on sequence 13, image 47. Findings are compatible with a type 2 endoleak and most likely related to a  lumbar artery. Celiac: Celiac trunk is patent without significant stenosis. SMA: Minimal plaque at the origin of the SMA without significant stenosis. Evidence for  active contrast extravasation within the descending colon on sequence 6, image 69. There is accumulation of contrast in the same area on the delayed images on sequence 13, image 27. Findings are compatible with active GI bleeding. Renals: Bilateral renal arteries are patent without significant stenosis. Small accessory left renal artery. Aortic stent graft is positioned below the renal arteries. IMA: Embolization coils at the origin of the IMA likely related to previous type 2 leak treatment. Reconstitution of the distal IMA branches. Inflow: Bifurcated aortic stent graft extends into the common iliac arteries bilaterally. Both limbs are widely patent. There is flow in the internal and external iliac arteries bilaterally. Proximal Outflow: Proximal femoral arteries are patent bilaterally. Veins: Patent veins are patent. Main portal venous system is patent. Renal veins are patent. No gross abnormality to the iliac veins. Review of the MIP images confirms the above findings. NON-VASCULAR Lower chest: Prominent interstitial lung markings bilaterally suggestive for chronic changes and areas of scarring and fibrosis. No pleural effusions. Hepatobiliary: Mild dilatation of left hepatic bile ducts is unchanged. There may be some dilated peripheral bile ducts in the right hepatic lobe which are chronic. No acute abnormality to the liver or gallbladder. Pancreas: Mild dilatation of the main pancreatic duct is chronic. No evidence for pancreatic inflammation. Spleen: Spleen is small for size and stable. Adrenals/Urinary Tract: Normal appearance of the adrenal glands. No hydronephrosis. No suspicious renal lesions. There is evidence for a diverticulum involving the right posterior aspect of the urinary bladder. There is high-density material near the diverticulum that is suspicious for an enhancing lesion measuring roughly 2.3 cm. Stomach/Bowel: Rectum is mildly distended with gas and fluid. Extensive diverticulosis in the  descending colon and proximal sigmoid colon region. Evidence for active GI bleeding involving the descending colon along the left lateral abdomen. No other area suspicious for active GI bleeding. Normal appearance of the stomach. Lymphatic: No abdominal or pelvic lymphadenopathy. Reproductive: Prostate is unremarkable. Other: Negative for free fluid. There is probably some fluid within the lower inguinal canals. Musculoskeletal: No acute bone abnormality. Mild anterolisthesis at L4-L5. IMPRESSION: VASCULAR 1. Positive for active GI bleeding in the left colon. 2. Endovascular repair of an abdominal aortic aneurysm. The aortic stent graft is patent. 3. Aortic aneurysm sac has slightly enlarged since 2018 and there is a type 2 endoleak on the delayed images. NON-VASCULAR 1. Enhancing lesion along the right side of the bladder. 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 Recommend urology consultation. 2. Colonic diverticulosis. 3. Question some fluid within the inguinal canals. These results were called by telephone at the time of interpretation on 03/22/2020 at 3:19 pm to provider Dr. Horris Latino, who verbally acknowledged these results. Electronically Signed   By: Markus Daft M.D.   On: 03/22/2020 15:22      Principal Problem:   Rectal bleeding Active Problems:   COPD (chronic obstructive pulmonary disease) (HCC)   ILD (interstitial lung disease) ([place])   Pulmonary hypertension, unspecified (HCC)   Aortic stenosis, moderate   S/P CABG (coronary artery bypass graft)   Acute GI bleeding     LOS: 1 day   Tye Savoy ,NP 03/24/2020, 8:49 AM     Attending physician's note   I have taken an interval history, reviewed the chart and examined the patient. I agree with the Advanced Practitioner's note, impression and recommendations.  No further episodes of bleeding.  Hemoglobin trended down likely equilibrating s/p diverticular hemorrhage Continue to monitor hemoglobin and transfuse as  needed  If no further bleeding and hemoglobin responds appropriately to blood transfusion, can be discharged home tomorrow  K. Denzil Magnuson , MD [phone]

## 2020-03-24 NOTE — Progress Notes (Signed)
PT Cancellation Note  Patient Details Name: Xain Petska MRN: [id number] DOB: 1923/05/17   Cancelled Treatment:    Reason Eval/Treat Not Completed: Other (comment)(pt declined PT, he requested we try back another time, he'd like to have more time to adjust to having an IV. Will follow.)  Philomena Doheny PT 03/24/2020  Acute Rehabilitation Services Pager [phone] Office [phone]

## 2020-03-25 LAB — CBC WITH DIFFERENTIAL/PLATELET
Abs Immature Granulocytes: 0.08 10*3/uL — ABNORMAL HIGH (ref 0.00–0.07)
Basophils Absolute: 0 10*3/uL (ref 0.0–0.1)
Basophils Relative: 0 %
Eosinophils Absolute: 0.3 10*3/uL (ref 0.0–0.5)
Eosinophils Relative: 3 %
HCT: 22.5 % — ABNORMAL LOW (ref 39.0–52.0)
Hemoglobin: 7.4 g/dL — ABNORMAL LOW (ref 13.0–17.0)
Immature Granulocytes: 1 %
Lymphocytes Relative: 28 %
Lymphs Abs: 2.7 10*3/uL (ref 0.7–4.0)
MCH: 32.3 pg (ref 26.0–34.0)
MCHC: 32.9 g/dL (ref 30.0–36.0)
MCV: 98.3 fL (ref 80.0–100.0)
Monocytes Absolute: 1 10*3/uL (ref 0.1–1.0)
Monocytes Relative: 10 %
Neutro Abs: 5.6 10*3/uL (ref 1.7–7.7)
Neutrophils Relative %: 58 %
Platelets: 245 10*3/uL (ref 150–400)
RBC: 2.29 MIL/uL — ABNORMAL LOW (ref 4.22–5.81)
RDW: 17.6 % — ABNORMAL HIGH (ref 11.5–15.5)
WBC: 9.7 10*3/uL (ref 4.0–10.5)
nRBC: 0.3 % — ABNORMAL HIGH (ref 0.0–0.2)

## 2020-03-25 LAB — BASIC METABOLIC PANEL
Anion gap: 7 (ref 5–15)
BUN: 25 mg/dL — ABNORMAL HIGH (ref 8–23)
CO2: 24 mmol/L (ref 22–32)
Calcium: 8 mg/dL — ABNORMAL LOW (ref 8.9–10.3)
Chloride: 105 mmol/L (ref 98–111)
Creatinine, Ser: 0.97 mg/dL (ref 0.61–1.24)
GFR calc Af Amer: >60 mL/min (ref >60)
GFR calc non Af Amer: >60 mL/min (ref >60)
Glucose, Bld: 96 mg/dL (ref 70–99)
Potassium: 4.2 mmol/L (ref 3.5–5.1)
Sodium: 136 mmol/L (ref 135–145)

## 2020-03-25 LAB — GLUCOSE, CAPILLARY
Glucose-Capillary: 129 mg/dL — ABNORMAL HIGH (ref 70–99)
Glucose-Capillary: 108 mg/dL — ABNORMAL HIGH (ref 70–99)

## 2020-03-25 LAB — PREPARE RBC (CROSSMATCH)

## 2020-03-25 MED ORDER — SODIUM CHLORIDE 0.9% IV SOLUTION: Freq: Once | INTRAVENOUS | Status: DC

## 2020-03-25 NOTE — Evaluation (Signed)
Physical Therapy Evaluation Patient Details Name: Vincent Robinson MRN: [id number] DOB: 15-Nov-1922 Today's Date: 03/25/2020   History of Present Illness  86 year old male who presented with rectal bleeding.  He does have significant past medical history for severe pulmonary hypertension with cor pulmonale, chronic diastolic heart failure, atrial fibrillation and coronary artery disease  Clinical Impression   Pt presents with mild dyspnea on exertion, WFL strength for functional tasks, mild unsteadiness in standing, and decreased activity tolerance vs baseline. Pt to benefit from acute PT to address deficits. Pt ambulated hallway distance with use of RW, SpO2 ranging from 82-92% on 3LO2, pt asymptomatic. Pt close to mobility baseline. PT to progress mobility as tolerated, and will continue to follow acutely.      Follow Up Recommendations Supervision for mobility/OOB    Equipment Recommendations  None recommended by PT    Recommendations for Other Services       Precautions / Restrictions Precautions Precautions: Fall Precaution Comments: on 2-3LO2 chronically Restrictions Weight Bearing Restrictions: No      Mobility  Bed Mobility Overal bed mobility: Needs Assistance Bed Mobility: Supine to Sit     Supine to sit: Supervision     General bed mobility comments: for safety, increased time  Transfers Overall transfer level: Needs assistance   Transfers: Sit to/from Stand Sit to Stand: Supervision         General transfer comment: for safety, verbal cuing for hand placement when rising  Ambulation/Gait Ambulation/Gait assistance: Supervision;Min guard Gait Distance (Feet): 100 Feet Assistive device: Rolling walker (2 wheeled) Gait Pattern/deviations: Step-through pattern;Decreased stride length;Trunk flexed Gait velocity: decr   General Gait Details: min guard initially, transitioning to supervision for safety. Verbal cuing for breathing technique and rest breaks as  needed, slow and steady gait.  Stairs            Wheelchair Mobility    Modified Rankin (Stroke Patients Only)       Balance Overall balance assessment: Mild deficits observed, not formally tested;History of Falls                                           Pertinent Vitals/Pain Pain Assessment: No/denies pain    Home Living Family/patient expects to be discharged to:: Private residence Living Arrangements: Spouse/significant other Available Help at Discharge: Family;Available 24 hours/day Type of Home: House Home Access: Level entry     Home Layout: One level Home Equipment: Walker - 2 wheels;Cane - single point      Prior Function Level of Independence: Independent         Comments: Reports he has a walker, uses it intermittently     Hand Dominance   Dominant Hand: Right    Extremity/Trunk Assessment   Upper Extremity Assessment Upper Extremity Assessment: Defer to OT evaluation    Lower Extremity Assessment Lower Extremity Assessment: Overall WFL for tasks assessed    Cervical / Trunk Assessment Cervical / Trunk Assessment: Normal  Communication   Communication: No difficulties  Cognition Arousal/Alertness: Awake/alert Behavior During Therapy: WFL for tasks assessed/performed Overall Cognitive Status: Within Functional Limits for tasks assessed                                        General Comments General comments (skin integrity, edema, etc.): SpO2 82-92%  on 3LO2 during mobility, breathing technique encouraged (in through nose, out through mouth) - pt asymptomatic. HRmax 120 bpm    Exercises     Assessment/Plan    PT Assessment Patient needs continued PT services  PT Problem List Decreased strength;Decreased mobility;Decreased activity tolerance;Decreased balance;Decreased knowledge of use of DME;Decreased safety awareness       PT Treatment Interventions DME instruction;Therapeutic activities;Gait  training;Therapeutic exercise;Patient/family education;Balance training;Functional mobility training;Neuromuscular re-education    PT Goals (Current goals can be found in the Care Plan section)  Acute Rehab PT Goals Patient Stated Goal: go home PT Goal Formulation: With patient Time For Goal Achievement: 04/08/20 Potential to Achieve Goals: Good    Frequency Min 3X/week   Barriers to discharge        Co-evaluation               AM-PAC PT "6 Clicks" Mobility  Outcome Measure Help needed turning from your back to your side while in a flat bed without using bedrails?: None Help needed moving from lying on your back to sitting on the side of a flat bed without using bedrails?: None Help needed moving to and from a bed to a chair (including a wheelchair)?: A Little Help needed standing up from a chair using your arms (e.g., wheelchair or bedside chair)?: A Little Help needed to walk in hospital room?: A Little Help needed climbing 3-5 steps with a railing? : A Little 6 Click Score: 20    End of Session Equipment Utilized During Treatment: Gait belt;Oxygen Activity Tolerance: Patient tolerated treatment well Patient left: in chair;with call bell/phone within reach;with nursing/sitter in room Nurse Communication: Mobility status PT Visit Diagnosis: Other abnormalities of gait and mobility (R26.89);Muscle weakness (generalized) (M62.81)    Time: 0175-1025 PT Time Calculation (min) (ACUTE ONLY): 17 min   Charges:   PT Evaluation $PT Eval Low Complexity: 1 Low         Syndi Pua E, PT Acute Rehabilitation Services Pager [phone]  Office [phone]  Lashawnda Hancox D Angele Wiemann 03/25/2020, 11:19 AM

## 2020-03-25 NOTE — TOC Initial Note (Signed)
Transition of Care [facility]) - Initial/Assessment Note    Patient Details  Name: Vincent Robinson MRN: [id number] Date of Birth: 10/11/1923  Transition of [facility]) CM/SW Contact:    Shade Flood, LCSW Phone Number: 03/25/2020, 11:00 AM  Clinical Narrative:                  Pt admitted from home. Reviewed pt's record today due to high readmission risk score. Met with pt at bedside to assess. Pt alert and oriented x4 and very pleasant. Pt states that he lives with his wife and he plans to return home at dc. Pt reports being independent in ADLs at home. He has a walker but does not need to use it. Pt drives and states that he is able to get to appointments and obtain medications as needed.  Discussed dc planning needs including DME or HH and pt did not feel that he would need either one at dc. Pt aware that TOC will follow and assist if needs arise.  Expected Discharge Plan: Home/Self Care Barriers to Discharge: Continued Medical Work up   Patient Goals and CMS Choice Patient states their goals for this hospitalization and ongoing recovery are:: Return home      Expected Discharge Plan and Services Expected Discharge Plan: Home/Self Care In-house Referral: Clinical Social Work     Living arrangements for the past 2 months: Single Family Home                                      Prior Living Arrangements/Services Living arrangements for the past 2 months: Single Family Home Lives with:: Spouse Patient language and need for interpreter reviewed:: Yes Do you feel safe going back to the place where you live?: Yes      Need for Family Participation in Patient Care: No (Comment) Care giver support system in place?: No (comment) Current home services: DME Criminal Activity/Legal Involvement Pertinent to Current Situation/Hospitalization: No - Comment as needed  Activities of Daily Living Home Assistive Devices/Equipment: BIPAP, Dentures (specify type), Eyeglasses, Walker  (specify type)(upper/lower dentures, 4 wheeled walker) ADL Screening (condition at time of admission) Patient's cognitive ability adequate to safely complete daily activities?: Yes Is the patient deaf or have difficulty hearing?: No Does the patient have difficulty seeing, even when wearing glasses/contacts?: No Does the patient have difficulty concentrating, remembering, or making decisions?: No Patient able to express need for assistance with ADLs?: Yes Does the patient have difficulty dressing or bathing?: No Independently performs ADLs?: Yes (appropriate for developmental age) Does the patient have difficulty walking or climbing stairs?: No Weakness of Legs: Both Weakness of Arms/Hands: None  Permission Sought/Granted                  Emotional Assessment Appearance:: Appears younger than stated age Attitude/Demeanor/Rapport: Engaged Affect (typically observed): Pleasant Orientation: : Oriented to Self, Oriented to Place, Oriented to  Time, Oriented to Situation Alcohol / Substance Use: Not Applicable Psych Involvement: No (comment)  Admission diagnosis:  Rectal bleeding [K62.5] Acute GI bleeding [K92.2] Lower GI bleed [K92.2] Patient Active Problem List   Diagnosis Date Noted  . Diverticular hemorrhage   . Rectal bleeding 03/22/2020  . Acute GI bleeding 03/22/2020  . Hypokalemia   . Hypotension   . Acute respiratory failure with hypoxia ([place]) 12/18/2019  . Acute on chronic right-sided heart failure ([place]) 12/18/2019  . Pressure injury of skin 11/24/2019  .  Chronic respiratory failure ([place])   . Acute on chronic respiratory failure ([place]) 11/23/2019  . Right heart failure ([place])   . S/P CABG (coronary artery bypass graft) 11/22/2019  . Chest pain 11/22/2019  . Atrial fibrillation ([place]) 08/04/2019  . Pulmonary hypertension, unspecified ([place]) 10/11/2017  . Aortic stenosis, moderate 10/11/2017  . Coronary artery disease 08/30/2017  . AAA (abdominal aortic aneurysm)  without rupture ([place]) 08/30/2017  . Acute blood loss anemia 06/07/2017  . Hemorrhagic shock ([place]) 06/07/2017  . Lower GI bleed 06/05/2017  . Sleep apnea 06/05/2017  . Hypothyroidism 06/05/2017  . Hyperlipidemia 06/05/2017  . Asthma 06/05/2017  . ILD (interstitial lung disease) ([place]) 05/28/2017  . Pneumothorax on right 05/20/2017  . COPD (chronic obstructive pulmonary disease) ([place]) 05/20/2017  . Pneumothorax 05/20/2017  . Skin cancer 10/23/2015   PCP:  Katherina Mires, MD Pharmacy:   [id number] [place], [address]Phone: [phone]: [phone] [address]Phone: [phone]: [phone]    Social Determinants of Health (SDOH) Interventions    Readmission Risk Interventions Readmission Risk Prevention Plan 11/24/2019  Transportation Screening Complete  PCP or Specialist Appt within 5-7 Days Patient refused  Home Care Screening Complete  Medication Review (RN CM) Referral to Pharmacy  Some recent data might be hidden

## 2020-03-25 NOTE — Progress Notes (Signed)
PROGRESS NOTE    Vincent Robinson  HEN:[id number] DOB: [birth year] DOA: 03/21/2020 PCP: Katherina Mires, MD    Brief Narrative:  Patient admitted to the hospital with a working diagnosis of acute blood loss anemia due to lower GI bleed/diverticular bleed.  86 year old male who presented with rectal bleeding. He does have significant past medical history for severe pulmonary hypertension with cor pulmonale, chronic diastolic heart failure, atrial fibrillation and coronary artery disease. Patient reported 2 large bloody bowel movements, no nausea, no vomiting andno fevers. On his initial physical examination blood pressure 123/68, heart rate 74, respiratory 21, oxygen saturation 95%, his lungs are clear to auscultation bilaterally, heart S1-S2 present rhythmic, soft abdomen, no lower extremity edema.  Patient has remained hemodynamically stable, further work-up with CT of the abdomen pelvis show active bleeding in the left colon. IR was consulted, angiogram was negative for active bleeding. Not candidate for embolization, chronic occlusion of the IMA.  Patient's hemoglobin has slowly trended down.  Sp IV iron and 2 units PRBC transfusion.   Assessment & Plan:   Principal Problem:   Rectal bleeding Active Problems:   COPD (chronic obstructive pulmonary disease) (HCC)   ILD (interstitial lung disease) ([place])   Pulmonary hypertension, unspecified (HCC)   Aortic stenosis, moderate   S/P CABG (coronary artery bypass graft)   Acute GI bleeding   Diverticular hemorrhage   1. Acute blood loss anemia due to lower GI bleed, diverticular bleed/ iron deficiency anemia CT abdomen with active bleeding in the left colon. Angiography with no signs of bleeding and difficult vascular anatomy for embolization.   His hemoglobin has increased from 7,2 to 7,4 sp one unit PRBC, patient with no melena or hematochezia, considering his cardiopulmonary limitation due to pulmonary HTN and core pulmonale with  hypoxic respiratory failure, will target for Hgb close to 8,0.   Follow cell count in am. Out of bed to chair and physical therapy evaluation in preparation for discharge in am.   2. Severe pulmonary HTN  (mean PA 44) with chronic core pulmonale. Blood pressure continue to be low, this am 75/40 with HR 52. Patient with no hypoperfusion symptoms. Will continue to encourage out of bed.   Continue with midodrine tid. Will continue to hold on torsemide for now due to risk of worsening hypotension.   3. COPD and ILD. Oxymetry os 93% on 2 L/ min per [place]. Will continue with  [place]. No signs of exacerbation.   4. CAD sp CABG/ Aortic aneurysm sp graft/ dyslipidemia. Positive endoleak.   Plan for outpatient follow up, will continue with simvastatin 10 mg daily. Hold on antiplatelet therapy for now due to acute anemia.   5. Hypothyroid. On levothyroxine.   6. Chronic paroxysmal atrial fibrillation. Telemetry personally reviewed, atrial fibrillation rhythm, rate 65 bpm.   Will check EKG. Patient with severe pulmonary hypertension, last echocardiogram in the system (old records personally reviewed), 02/21 had preserved LV systolic function 36% and severe reduction in RV systolic function, moderately enlarged cavity. Moderate tricuspid regurgitation and moderate aortic stenosis.   Status is: Inpatient  Remains inpatient appropriate because:IV treatments appropriate due to intensity of illness or inability to take PO   Dispo: The patient is from: Home              Anticipated d/c is to: Home              Anticipated d/c date is: 1 day  Patient currently is not medically stable to d/c.    DVT prophylaxis: scd   Code Status:   Partial   Family Communication:  No family at the bedside      Consultants:   GI   Subjective: Patient is feeling better, but his Hgb still not yet back to baseline, no nausea or vomiting, no dyspnea or chest pain, denies any melena or hematochezia.    Objective: Vitals:   03/24/20 2102 03/24/20 2249 03/25/20 0523 03/25/20 0805  BP: (!) 92/41  (!) 75/40   Pulse: (!) 52 (!) 58 60   Resp: 18  19   Temp: (!) 97.4 F (36.3 C)  98.3 F (36.8 C)   TempSrc: Oral  Oral   SpO2: 100%  93% (!) 85%  Weight:      Height:        Intake/Output Summary (Last 24 hours) at 03/25/2020 0856 Last data filed at 03/25/2020 0500 Gross per 24 hour  Intake 1032 ml  Output 725 ml  Net 307 ml   Filed Weights   03/22/20 2019  Weight: 76.7 kg    Examination:   General: Not in pain or dyspnea, deconditioned  Neurology: Awake and alert, non focal  E ENT: positive pallor, no icterus, oral mucosa moist Cardiovascular: No JVD. S1-S2 present, rhythmic, no gallops, rubs, or murmurs. No lower extremity edema. Pulmonary: positive breath sounds bilaterally, adequate air movement, no wheezing, rhonchi or rales. Gastrointestinal. Abdomen with no organomegaly, non tender, no rebound or guarding Skin. No rashes Musculoskeletal: no joint deformities     Data Reviewed: I have personally reviewed following labs and imaging studies  CBC: Recent Labs  Lab 03/22/20 0042 03/22/20 0426 03/22/20 0757 03/22/20 0757 03/22/20 1244 03/22/20 1733 03/23/20 0542 03/24/20 0517 03/25/20 0514  WBC 10.2   < > 10.7*  --  12.4* 9.9 11.9*  --  9.7  NEUTROABS 6.2  --   --   --   --   --   --   --  5.6  HGB 12.6*   < > 11.0*   < > 11.7* 8.7* 8.4* 7.2* 7.4*  HCT 38.6*   < > 33.3*   < > 35.7* 27.0* 26.3* 22.5* 22.5*  MCV 96.0   < > 97.4  --  97.8 98.5 98.1  --  98.3  PLT 315   < > 276  --  340 253 241  --  245   < > = values in this interval not displayed.   Basic Metabolic Panel: Recent Labs  Lab 03/22/20 0042 03/22/20 0426 03/23/20 0542 03/25/20 0514  NA 140 139 138 136  K 3.9 3.9 4.0 4.2  CL 102 105 104 105  CO2 28 25 23 24   GLUCOSE 112* 107* 103* 96  BUN 23 23 28* 25*  CREATININE 1.39* 1.24 0.99 0.97  CALCIUM 8.6* 8.0* 8.1* 8.0*   GFR: Estimated  Creatinine Clearance: 43.5 mL/min (by C-G formula based on SCr of 0.97 mg/dL). Liver Function Tests: No results for input(s): AST, ALT, ALKPHOS, BILITOT, PROT, ALBUMIN in the last 168 hours. No results for input(s): LIPASE, AMYLASE in the last 168 hours. No results for input(s): AMMONIA in the last 168 hours. Coagulation Profile: Recent Labs  Lab 03/22/20 2008  INR 1.2   Cardiac Enzymes: No results for input(s): CKTOTAL, CKMB, CKMBINDEX, TROPONINI in the last 168 hours. BNP (last 3 results) No results for input(s): PROBNP in the last 8760 hours. HbA1C: No results for input(s): HGBA1C in  the last 72 hours. CBG: Recent Labs  Lab 03/23/20 1657 03/24/20 0019 03/24/20 0800 03/24/20 1608 03/24/20 2321  GLUCAP 135* 112* 112* 120* 111*   Lipid Profile: No results for input(s): CHOL, HDL, LDLCALC, TRIG, CHOLHDL, LDLDIRECT in the last 72 hours. Thyroid Function Tests: No results for input(s): TSH, T4TOTAL, FREET4, T3FREE, THYROIDAB in the last 72 hours. Anemia Panel: No results for input(s): VITAMINB12, FOLATE, FERRITIN, TIBC, IRON, RETICCTPCT in the last 72 hours.    Radiology Studies: I have reviewed all of the imaging during this hospital visit personally     Scheduled Meds: . sodium chloride   Intravenous Once  . levothyroxine  112 mcg Oral Q0600  . macitentan  10 mg Oral Daily  . midodrine  10 mg Oral TID with meals  . mometasone-formoterol  2 puff Inhalation BID  . pantoprazole  20 mg Oral BID  . sildenafil  40 mg Oral TID  . simvastatin  10 mg Oral QHS   Continuous Infusions:   LOS: 2 days        Brookie Wayment Gerome Apley, MD

## 2020-03-25 NOTE — Progress Notes (Signed)
Patient was yellow mews at the start of this shift.

## 2020-03-25 NOTE — Care Management Important Message (Signed)
Important Message  Patient Details  Name: Vincent Robinson MRN: [id number] Date of Birth: 20-Feb-1923   Medicare Important Message Given:    Document has been given to [place], NCM  to give to the patient.    Crista Luria 03/25/2020, 11:15 AM

## 2020-03-26 LAB — GLUCOSE, CAPILLARY
Glucose-Capillary: 90 mg/dL (ref 70–99)
Glucose-Capillary: 96 mg/dL (ref 70–99)

## 2020-03-26 LAB — HEMOGLOBIN AND HEMATOCRIT, BLOOD
HCT: 26.5 % — ABNORMAL LOW (ref 39.0–52.0)
Hemoglobin: 8.6 g/dL — ABNORMAL LOW (ref 13.0–17.0)

## 2020-03-26 NOTE — TOC Progression Note (Signed)
Transition of Care [facility]) - Progression Note    Patient Details  Name: Vincent Robinson MRN: [id number] Date of Birth: August 15, 1923  [facility]) CM/SW Contact  Joaquin Courts, RN Phone Number: 03/26/2020, 12:08 PM  Clinical Narrative:    CM spoke with patient.  [place] to provide HHPT/OT/RN services.    Expected Discharge Plan: [place] Barriers to Discharge: No Barriers Identified  Expected Discharge Plan and Services Expected Discharge Plan: [place] In-house Referral: Clinical Social Work   Post Acute Care Choice: [place] arrangements for the past 2 months: [place] Expected Discharge Date: 03/26/20                         HH Arranged: PT, OT, RN [place] Agency: [place] Date [place]: 03/26/20 Time [place]: 1208 Representative spoke with at [place]: [place] ([place]) Interventions    Readmission Risk Interventions Readmission Risk Prevention Plan 03/25/2020 11/24/2019  Transportation Screening Complete Complete  PCP or Specialist Appt within 5-7 Days - Patient refused  Home Care Screening - Complete  Medication Review (RN CM) - Referral to Pharmacy  HRI or Home Care Consult Patient refused -  Palliative Care Screening Not Applicable -  Medication Review (RN Care Manager) Complete -  Some recent data might be hidden

## 2020-03-26 NOTE — Progress Notes (Addendum)
          Daily Rounding Note  03/26/2020, 10:26 AM  LOS: 3 days   SUBJECTIVE:   Chief complaint: Lower GI bleed    Small stool yest and today, pt not sure if there was blood or not.  Just passed a soft black stool.  Feels well.  Never had ab pain.  No new weakness or dyspnea  OBJECTIVE:         Vital signs in last 24 hours:    Temp:  [97.6 F (36.4 C)-98.2 F (36.8 C)] 97.9 F (36.6 C) (06/05 0832) Pulse Rate:  [57-69] 69 (06/05 0832) Resp:  [18-22] 20 (06/05 0832) BP: (79-96)/(36-55) 96/55 (06/05 0832) SpO2:  [90 %-98 %] 90 % (06/05 0832) Last BM Date: 03/21/20 Filed Weights   03/22/20 2019  Weight: 76.7 kg   General: elderly alert, sharp mentally   Heart: RRR Chest: no labored breathing Abdomen: soft, NT, ND active BS  Extremities: No LE edema Neuro/Psych:  Oriented x 3.  Fully alert.  Good memory.    Intake/Output from previous day: 06/04 0701 - 06/05 0700 In: 402.5 [I.V.:100; Blood:302.5] Out: 475 [Urine:475]  Intake/Output this shift: Total I/O In: -  Out: 100 [Urine:100]  Lab Results: Recent Labs    03/24/20 0517 03/25/20 0514 03/26/20 0919  WBC  --  9.7  --   HGB 7.2* 7.4* 8.6*  HCT 22.5* 22.5* 26.5*  PLT  --  245  --    BMET Recent Labs    03/25/20 0514  NA 136  K 4.2  CL 105  CO2 24  GLUCOSE 96  BUN 25*  CREATININE 0.97  CALCIUM 8.0*   LFT No results for input(s): PROT, ALBUMIN, AST, ALT, ALKPHOS, BILITOT, BILIDIR, IBILI in the last 72 hours. PT/INR No results for input(s): LABPROT, INR in the last 72 hours. Hepatitis Panel No results for input(s): HEPBSAG, HCVAB, HEPAIGM, HEPBIGM in the last 72 hours.  Studies/Results: No results found.  ASSESMENT:   *  Painless hematochezia.  Presumed diverticular bleed.  Hx of same over several years: 2012, 2018.     6/1 CT angio: previous coils from  IMA embolization for leak and stent graft AAA repair.  + active L colon bleeding.  Subsequent  multi-vessell Angiography negative for active bleeding,  No intervention and further angiography not likely to be of benefit.  .      *   Blood loss anemia.   Hgb 12.6 >> 7.2 >> 8.6.  2 PRBCs so far on 6/3 and 6/4.  feraheme on 6/3.     *  Severe pulmonary htn. Cor pulmonale.  R heart CHF.       PLAN   *  Plan is discharge home today Would have CBC rechecked in 7 to 10 days.   No need of GI fup unless having issues.  Pt aware he needs to return to ED for recurrent hematochezia.      Azucena Freed  03/26/2020, 10:26 AM Phone [phone]  Reviewed and agree with documentation and assessment and plan. Damaris Hippo , MD

## 2020-03-26 NOTE — Progress Notes (Signed)
RN notified of the yellow Mews due to respiratory rate entered by RT. RN retook patient's full set of vitals and patient was found to be in [employer].

## 2020-03-26 NOTE — Discharge Summary (Addendum)
Physician Discharge Summary  Vincent Robinson HFW:[id number] DOB: [birth year] DOA: 03/21/2020  PCP: Katherina Mires, MD  Admit date: 03/21/2020 Discharge date: 03/26/2020  Admitted From: Home  Disposition:  Home   Recommendations for Outpatient Follow-up and new medication changes:  1. Follow up Dr. Doreene Nest in 7 days.  2. Holding aspiring to prevent recurrent bleeding.  3. Patient to resume diuretic therapy at discharge.    Home Health: yes   Equipment/Devices: no    Discharge Condition: stable  CODE STATUS: Partial (no CPR and no intubation) Diet recommendation: heart healthy   Brief/Interim Summary: Patient admitted to the hospital with a working diagnosis of acute blood loss anemia due to lower GI bleed/diverticular bleed.  86 year old male who presented with rectal bleeding. He does have significant past medical history for severe pulmonary hypertension with cor pulmonale, chronic diastolic heart failure, paroxysmal atrial fibrillation and coronary artery disease. Patient reported 2 large bloody bowel movements, no nausea, no vomiting andno fevers. On his initial physical examination blood pressure 123/68, heart rate 74, respiratory 21, oxygen saturation 95%, his lungs were clear to auscultation bilaterally, heart S1-S2 present rhythmic, soft abdomen, no lower extremity edema. Sodium 140, potassium 3.9, chloride 102, bicarb 28, glucose 112, BUN 23, creatinine 1.39, white count 10.2, hemoglobin 12.6, hematocrit 38.6, platelets 315.  SARS COVID-19 negative.  Patient has remained hemodynamically stable, further work-up with CT of the abdomen pelvis show active bleeding in the left colon. IR was consulted, angiogram was negative for active bleeding. Not candidate for embolization, chronic occlusion of the IMA.  Patient's hemoglobin has slowly trended down.  Sp IV iron and 2 units PRBC transfusion.   1.  Acute blood loss anemia due to lower GI bleed, possible diverticular bleed, iron  deficiency anemia. Patient admitted to the telemetry ward, he had further evaluation by IR and gastroenterology.  Her CT showed active bleeding in the left colon, further angiography with no bleeding, patient not candidate for embolization due to chronic occlusion of the IMA.  His diet was advanced progressively with good toleration.  He had a slow trend in his hemoglobin down to 7.2.  Patient received 2 units packed red blood cells and 1 dose of IV iron.  His aspirin has been discontinued and will recommend to continue hold for now. Continue oral iron supplementation.   Discharge Hgb is 8,6.   2.  Severe pulmonary hypertension with chronic cor pulmonale.  Patient blood pressure has remained low, but his map has been between 60 and 65, at discharge is 69.  No signs of hypoperfusion.  Patient will continue midodrine, macitentan and sildenafil.  At discharge patient will resume his diuretic therapy with Aldactone and torsemide.  He does follow-up with heart failure clinic as an outpatient.  3.  COPD/ILD.  No signs of acute exacerbation, patient will continue fluticasone and salmeterol.   4.  Coronary artery disease status post CABG, aortic aneurysm status post graft, dyslipidemia.  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  Which will be a closely follow-up, no acute intervention indicated per Dr Anselm Pancoast from interventional radiology.   5.  Incidental finding enhancing lesion along the right side of the bladder.  Case discussed with urology, recommendation outpatient follow-up.  6.  Hypothyroid.  Continue levothyroxine.  7.  Chronic paroxysmal atrial fibrillation.  Patient had atrial fibrillation rhythm, rate controlled.  Continue to hold anticoagulation due to risk of recurrent gastrointestinal bleeding.  8. AKI. Diuretic therapy was held and patient received supportive medical therapy, renal function  improved with discharge serum cr at 0,97 with K at 4,2 and serum bicarbonate at 24.    Discharge Diagnoses:  Principal Problem:   Rectal bleeding Active Problems:   COPD (chronic obstructive pulmonary disease) (HCC)   ILD (interstitial lung disease) ([facility])   Pulmonary hypertension, unspecified (HCC)   Aortic stenosis, moderate   S/P CABG (coronary artery bypass graft)   Acute GI bleeding   Diverticular hemorrhage    Discharge Instructions   Allergies as of 03/26/2020   No Known Allergies     Medication List    STOP taking these medications   aspirin 81 MG chewable tablet     TAKE these medications   albuterol 108 (90 Base) MCG/ACT inhaler Commonly known as: VENTOLIN HFA Inhale 1 puff into the lungs 2 (two) times daily.   ferrous sulfate 325 (65 FE) MG tablet Take 1 tablet (325 mg total) by mouth 2 (two) times daily with a meal.   fluticasone 27.5 MCG/SPRAY nasal spray Commonly known as: VERAMYST Place 1 spray into the nose daily as needed for rhinitis.   Fluticasone-Salmeterol(sensor) 113-14 MCG/ACT Aepb Inhale 1 puff into the lungs 2 (two) times daily.   levothyroxine 112 MCG tablet Commonly known as: SYNTHROID Take 112 mcg by mouth daily before breakfast.   loratadine 10 MG tablet Commonly known as: CLARITIN Take 10 mg by mouth daily as needed for allergies.   midodrine 10 MG tablet Commonly known as: PROAMATINE Take 1 tablet (10 mg total) by mouth 3 (three) times daily.   Opsumit 10 MG tablet Generic drug: macitentan Take 1 tablet (10 mg total) by mouth daily.   pantoprazole 20 MG tablet Commonly known as: PROTONIX Take 20 mg by mouth 2 (two) times daily.   potassium chloride 20 MEQ/15ML (10%) Soln Take 15 mLs (20 mEq total) by mouth 2 (two) times daily.   sildenafil 20 MG tablet Commonly known as: REVATIO Take 2 tablets (40 mg total) by mouth 3 (three) times daily.   simvastatin 10 MG tablet Commonly known as: ZOCOR Take 10 mg by mouth at bedtime.   Singulair 10 MG tablet Generic drug: montelukast Take 10 mg by mouth at  bedtime.   spironolactone 25 MG tablet Commonly known as: ALDACTONE Take 0.5 tablets (12.5 mg total) by mouth daily.   torsemide 20 MG tablet Commonly known as: DEMADEX Take 2 tablets (40 mg total) by mouth daily.      Follow-up Information    Katherina Mires, MD Follow up in 1 week(s).   Specialty: Family Medicine Contact information: [address] [place] [id number] [phone]          No Known Allergies  Consultations:  GI   Interventional radiology    Procedures/Studies: IR Angiogram Visceral Selective  Result Date: 03/23/2020 INDICATION: 86 year old male with a history of abdominal aortic aneurysm status post endovascular aortic repair. He presents with active lower GI bleeding. CTA of the abdomen and pelvis performed earlier today demonstrated a focus of active bleeding in the proximal descending colon at 15:22 p.m. Patient now presents for arteriogram and possible embolization. EXAM: SELECTIVE VISCERAL ARTERIOGRAPHY; ADDITIONAL ARTERIOGRAPHY; IR ULTRASOUND GUIDANCE VASC ACCESS RIGHT 1. Ultrasound-guided vascular access right common femoral artery 2. Catheterization of the celiac axis with arteriogram 3. Catheterization of the SMA with arteriogram 4. Catheterization of jejunal branch with arteriogram 5. Catheterization of accessory middle colic artery with arteriogram 6. Catheterization of 2nd jejunal branch with arteriogram MEDICATIONS: None ANESTHESIA/SEDATION: None CONTRAST:  61mL OMNIPAQUE  IOHEXOL 300 MG/ML SOLN, 48mL OMNIPAQUE IOHEXOL 300 MG/ML SOLN, 95mL OMNIPAQUE IOHEXOL 300 MG/ML SOLN FLUOROSCOPY TIME:  Fluoroscopy Time: 17 minutes 24 seconds (1690 mGy). COMPLICATIONS: None immediate. PROCEDURE: Informed consent was obtained from the patient following explanation of the procedure, risks, benefits and alternatives. The patient understands, agrees and consents for the procedure. All questions were addressed. A time out was performed prior to the  initiation of the procedure. Maximal barrier sterile technique utilized including caps, mask, sterile gowns, sterile gloves, large sterile drape, hand hygiene, and Betadine prep. The right common femoral artery was interrogated with ultrasound and found to be widely patent. An image was obtained and stored for the medical record. Local anesthesia was attained by infiltration with 1% lidocaine. A small dermatotomy was made. Under real-time sonographic guidance, the vessel was punctured with a 21 gauge micropuncture needle. Using standard technique, the initial micro needle was exchanged over a 0.018 micro wire for a transitional 4 [place] micro sheath. The micro sheath was then exchanged over a 0.035 wire for a 5 French vascular sheath. A C2 cobra catheter was advanced over a Bentson wire into the abdominal aorta. The catheter was used to select the celiac axis. An arteriogram was performed. Celiac arterial anatomy is normal. No evidence of replaced middle colic artery. No evidence of active hemorrhage. The catheter was next used to select the origin of the superior mesenteric artery. Digital subtraction angiography was performed. The middle colic artery is relatively hypertrophic in provide supply to the region of bleeding seen on the recent CT arteriogram. There is no evidence of active extravasation at the splenic flexure or within the descending colon at this time. In an effort to investigate further, a renegade STC microcatheter was advanced coaxially through the 5 French catheter over a Fathom 16 wire. For the next 15 minutes, extensive attempts were made to catheterize the middle colic artery without success. Other vessels catheterized and interrogated included the common trunk of 2 proximal jejunal branches. Arteriography demonstrates no evidence of active bleeding from the small bowel. Additionally, arteriography as the catheter was pulled back into the SMA confirms that the middle colic artery does not arise  from this arterial trunk. Continued interrogations of the SMA from a multiple angles and obliquities was performed. The origin of the middle colic artery overlaps the origin of several other arteries and could not be catheterized. Ultimately, the microcatheter was advanced into a small proximal artery. Arteriography demonstrates that this is an accessory middle colic artery providing arterial supply to the mid segment of the transverse colon. There is no evidence of active bleeding or vascular abnormality. Finally, the microcatheter was advanced into an arterial branch arising from the apparent site of the origin of the dominant middle colic artery. However, arteriography confirms that this is in fact another jejunal branch. No evidence of active bleeding from this arterial trunk. No communication with the middle colic artery. At this point, the microcatheter was removed. One final arteriogram was performed through the 5 French catheter at the SMA providing excellent opacification of the sigmoid colon. Again, no evidence of active bleeding. No further angiography was performed. The catheters were removed. Hemostasis was attained with the assistance of a 5 [place] Exoseal. IMPRESSION: 1. Negative multi selective visceral angiogram. No evidence of active GI bleeding. 2. Extremely tortuous vascular anatomy secondary to prior stent graft repair of the abdominal aorta and embolization of the origin of the inferior mesenteric artery resulting in collateralization through the SMA. Further angiography is unlikely  to be of benefit. PLAN: Further angiography is unlikely to be of benefit. Recommend supportive care with serial evaluation of the hemoglobin and hematocrit and transfusion as necessary. Signed, Criselda Peaches, MD, [facility] Electronically Signed   By: Jacqulynn Cadet M.D.   On: 03/23/2020 08:28   IR Angiogram Selective Each Additional  Vessel  Result Date: 03/23/2020 INDICATION: 86 year old male with a history of abdominal aortic aneurysm status post endovascular aortic repair. He presents with active lower GI bleeding. CTA of the abdomen and pelvis performed earlier today demonstrated a focus of active bleeding in the proximal descending colon at 15:22 p.m. Patient now presents for arteriogram and possible embolization. EXAM: SELECTIVE VISCERAL ARTERIOGRAPHY; ADDITIONAL ARTERIOGRAPHY; IR ULTRASOUND GUIDANCE VASC ACCESS RIGHT 1. Ultrasound-guided vascular access right common femoral artery 2. Catheterization of the celiac axis with arteriogram 3. Catheterization of the SMA with arteriogram 4. Catheterization of jejunal branch with arteriogram 5. Catheterization of accessory middle colic artery with arteriogram 6. Catheterization of 2nd jejunal branch with arteriogram MEDICATIONS: None ANESTHESIA/SEDATION: None CONTRAST:  52mL OMNIPAQUE IOHEXOL 300 MG/ML SOLN, 7mL OMNIPAQUE IOHEXOL 300 MG/ML SOLN, 33mL OMNIPAQUE IOHEXOL 300 MG/ML SOLN FLUOROSCOPY TIME:  Fluoroscopy Time: 17 minutes 24 seconds (1690 mGy). COMPLICATIONS: None immediate. PROCEDURE: Informed consent was obtained from the patient following explanation of the procedure, risks, benefits and alternatives. The patient understands, agrees and consents for the procedure. All questions were addressed. A time out was performed prior to the initiation of the procedure. Maximal barrier sterile technique utilized including caps, mask, sterile gowns, sterile gloves, large sterile drape, hand hygiene, and Betadine prep. The right common femoral artery was interrogated with ultrasound and found to be widely patent. An image was obtained and stored for the medical record. Local anesthesia was attained by infiltration with 1% lidocaine. A small dermatotomy was made. Under real-time sonographic guidance, the vessel was punctured with a 21 gauge micropuncture needle. Using standard technique, the  initial micro needle was exchanged over a 0.018 micro wire for a transitional 4 [place] micro sheath. The micro sheath was then exchanged over a 0.035 wire for a 5 French vascular sheath. A C2 cobra catheter was advanced over a Bentson wire into the abdominal aorta. The catheter was used to select the celiac axis. An arteriogram was performed. Celiac arterial anatomy is normal. No evidence of replaced middle colic artery. No evidence of active hemorrhage. The catheter was next used to select the origin of the superior mesenteric artery. Digital subtraction angiography was performed. The middle colic artery is relatively hypertrophic in provide supply to the region of bleeding seen on the recent CT arteriogram. There is no evidence of active extravasation at the splenic flexure or within the descending colon at this time. In an effort to investigate further, a renegade STC microcatheter was advanced coaxially through the 5 French catheter over a Fathom 16 wire. For the next 15 minutes, extensive attempts were made to catheterize the middle colic artery without success. Other vessels catheterized and interrogated included the common trunk of 2 proximal jejunal branches. Arteriography demonstrates no evidence of active bleeding from the small bowel. Additionally, arteriography as the catheter was pulled back into the SMA confirms that the middle colic artery does not arise from this arterial trunk. Continued interrogations of the SMA from a multiple angles and obliquities was performed. The origin of the middle colic artery overlaps the origin of several other arteries and could not be catheterized. Ultimately, the microcatheter was advanced  into a small proximal artery. Arteriography demonstrates that this is an accessory middle colic artery providing arterial supply to the mid segment of the transverse colon. There is no evidence of active bleeding or vascular abnormality. Finally, the microcatheter was advanced into  an arterial branch arising from the apparent site of the origin of the dominant middle colic artery. However, arteriography confirms that this is in fact another jejunal branch. No evidence of active bleeding from this arterial trunk. No communication with the middle colic artery. At this point, the microcatheter was removed. One final arteriogram was performed through the 5 French catheter at the SMA providing excellent opacification of the sigmoid colon. Again, no evidence of active bleeding. No further angiography was performed. The catheters were removed. Hemostasis was attained with the assistance of a 5 [place] Exoseal. IMPRESSION: 1. Negative multi selective visceral angiogram. No evidence of active GI bleeding. 2. Extremely tortuous vascular anatomy secondary to prior stent graft repair of the abdominal aorta and embolization of the origin of the inferior mesenteric artery resulting in collateralization through the SMA. Further angiography is unlikely to be of benefit. PLAN: Further angiography is unlikely to be of benefit. Recommend supportive care with serial evaluation of the hemoglobin and hematocrit and transfusion as necessary. Signed, Criselda Peaches, MD, [facility] Electronically Signed   By: Jacqulynn Cadet M.D.   On: 03/23/2020 08:28   IR US Guide Vasc Access Right  Result Date: 03/23/2020 INDICATION: 86 year old male with a history of abdominal aortic aneurysm status post endovascular aortic repair. He presents with active lower GI bleeding. CTA of the abdomen and pelvis performed earlier today demonstrated a focus of active bleeding in the proximal descending colon at 15:22 p.m. Patient now presents for arteriogram and possible embolization. EXAM: SELECTIVE VISCERAL ARTERIOGRAPHY; ADDITIONAL ARTERIOGRAPHY; IR ULTRASOUND GUIDANCE VASC ACCESS RIGHT 1. Ultrasound-guided vascular access right common femoral artery 2.  Catheterization of the celiac axis with arteriogram 3. Catheterization of the SMA with arteriogram 4. Catheterization of jejunal branch with arteriogram 5. Catheterization of accessory middle colic artery with arteriogram 6. Catheterization of 2nd jejunal branch with arteriogram MEDICATIONS: None ANESTHESIA/SEDATION: None CONTRAST:  82mL OMNIPAQUE IOHEXOL 300 MG/ML SOLN, 24mL OMNIPAQUE IOHEXOL 300 MG/ML SOLN, 29mL OMNIPAQUE IOHEXOL 300 MG/ML SOLN FLUOROSCOPY TIME:  Fluoroscopy Time: 17 minutes 24 seconds (1690 mGy). COMPLICATIONS: None immediate. PROCEDURE: Informed consent was obtained from the patient following explanation of the procedure, risks, benefits and alternatives. The patient understands, agrees and consents for the procedure. All questions were addressed. A time out was performed prior to the initiation of the procedure. Maximal barrier sterile technique utilized including caps, mask, sterile gowns, sterile gloves, large sterile drape, hand hygiene, and Betadine prep. The right common femoral artery was interrogated with ultrasound and found to be widely patent. An image was obtained and stored for the medical record. Local anesthesia was attained by infiltration with 1% lidocaine. A small dermatotomy was made. Under real-time sonographic guidance, the vessel was punctured with a 21 gauge micropuncture needle. Using standard technique, the initial micro needle was exchanged over a 0.018 micro wire for a transitional 4 [place] micro sheath. The micro sheath was then exchanged over a 0.035 wire for a 5 French vascular sheath. A C2 cobra catheter was advanced over a Bentson wire into the abdominal aorta. The catheter was used to select the celiac axis. An arteriogram was performed. Celiac arterial anatomy is normal. No evidence of replaced middle colic artery. No evidence of  active hemorrhage. The catheter was next used to select the origin of the superior mesenteric artery. Digital subtraction angiography  was performed. The middle colic artery is relatively hypertrophic in provide supply to the region of bleeding seen on the recent CT arteriogram. There is no evidence of active extravasation at the splenic flexure or within the descending colon at this time. In an effort to investigate further, a renegade STC microcatheter was advanced coaxially through the 5 French catheter over a Fathom 16 wire. For the next 15 minutes, extensive attempts were made to catheterize the middle colic artery without success. Other vessels catheterized and interrogated included the common trunk of 2 proximal jejunal branches. Arteriography demonstrates no evidence of active bleeding from the small bowel. Additionally, arteriography as the catheter was pulled back into the SMA confirms that the middle colic artery does not arise from this arterial trunk. Continued interrogations of the SMA from a multiple angles and obliquities was performed. The origin of the middle colic artery overlaps the origin of several other arteries and could not be catheterized. Ultimately, the microcatheter was advanced into a small proximal artery. Arteriography demonstrates that this is an accessory middle colic artery providing arterial supply to the mid segment of the transverse colon. There is no evidence of active bleeding or vascular abnormality. Finally, the microcatheter was advanced into an arterial branch arising from the apparent site of the origin of the dominant middle colic artery. However, arteriography confirms that this is in fact another jejunal branch. No evidence of active bleeding from this arterial trunk. No communication with the middle colic artery. At this point, the microcatheter was removed. One final arteriogram was performed through the 5 French catheter at the SMA providing excellent opacification of the sigmoid colon. Again, no evidence of active bleeding. No further angiography was performed. The catheters were removed.  Hemostasis was attained with the assistance of a 5 [place] Exoseal. IMPRESSION: 1. Negative multi selective visceral angiogram. No evidence of active GI bleeding. 2. Extremely tortuous vascular anatomy secondary to prior stent graft repair of the abdominal aorta and embolization of the origin of the inferior mesenteric artery resulting in collateralization through the SMA. Further angiography is unlikely to be of benefit. PLAN: Further angiography is unlikely to be of benefit. Recommend supportive care with serial evaluation of the hemoglobin and hematocrit and transfusion as necessary. Signed, Criselda Peaches, MD, [facility] Electronically Signed   By: Jacqulynn Cadet M.D.   On: 03/23/2020 08:28   CT Angio Abd/Pel w/ and/or w/o  Result Date: 03/22/2020 CLINICAL DATA:  86 year old with rectal bleeding. EXAM: CT ANGIOGRAPHY ABDOMEN AND PELVIS WITH CONTRAST AND WITHOUT CONTRAST TECHNIQUE: Multidetector CT imaging of the abdomen and pelvis was performed using the standard protocol during bolus administration of intravenous contrast. Multiplanar reconstructed images and MIPs were obtained and reviewed to evaluate the vascular anatomy. CONTRAST:  49mL OMNIPAQUE IOHEXOL 350 MG/ML SOLN COMPARISON:  CT abdomen and pelvis 06/05/2017 FINDINGS: VASCULAR Aorta: Patient has undergone endovascular repair of an abdominal aortic aneurysm with bifurcated aortic stent graft. The aorta and stent graft are patent. The native aortic aneurysm sac measures up to 4.8 cm and measured roughly 4.6 cm in 2018. There is evidence for an endoleak on the venous phase of imaging. There is contrast within the aneurysm sac posterior to the graft on sequence 13, image 47. 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 Celiac: Celiac trunk  is patent without significant stenosis. SMA: Minimal plaque at the origin of the SMA without  significant stenosis. Evidence for active contrast extravasation within the descending colon on sequence 6, image 69. There is accumulation of contrast in the same area on the delayed images on sequence 13, image 27. Findings are compatible with active GI bleeding. Renals: Bilateral renal arteries are patent without significant stenosis. Small accessory left renal artery. Aortic stent graft is positioned below the renal arteries. IMA: Embolization coils at the origin of the IMA likely related to previous type 2 leak treatment. Reconstitution of the distal IMA branches. Inflow: Bifurcated aortic stent graft extends into the common iliac arteries bilaterally. Both limbs are widely patent. There is flow in the internal and external iliac arteries bilaterally. Proximal Outflow: Proximal femoral arteries are patent bilaterally. Veins: Patent veins are patent. Main portal venous system is patent. Renal veins are patent. No gross abnormality to the iliac veins. Review of the MIP images confirms the above findings. NON-VASCULAR Lower chest: Prominent interstitial lung markings bilaterally suggestive for chronic changes and areas of scarring and fibrosis. No pleural effusions. Hepatobiliary: Mild dilatation of left hepatic bile ducts is unchanged. There may be some dilated peripheral bile ducts in the right hepatic lobe which are chronic. No acute abnormality to the liver or gallbladder. Pancreas: Mild dilatation of the main pancreatic duct is chronic. No evidence for pancreatic inflammation. Spleen: Spleen is small for size and stable. Adrenals/Urinary Tract: Normal appearance of the adrenal glands. No hydronephrosis. No suspicious renal lesions. There is evidence for a diverticulum involving the right posterior aspect of the urinary bladder. There is high-density material near the diverticulum that is suspicious for an enhancing lesion measuring roughly 2.3 cm. Stomach/Bowel: Rectum is mildly distended with gas and fluid.  Extensive diverticulosis in the descending colon and proximal sigmoid colon region. Evidence for active GI bleeding involving the descending colon along the left lateral abdomen. No other area suspicious for active GI bleeding. Normal appearance of the stomach. Lymphatic: No abdominal or pelvic lymphadenopathy. Reproductive: Prostate is unremarkable. Other: Negative for free fluid. There is probably some fluid within the lower inguinal canals. Musculoskeletal: No acute bone abnormality. Mild anterolisthesis at L4-L5. IMPRESSION: VASCULAR 1. Positive for active GI bleeding in the left colon. 2. Endovascular repair of an abdominal aortic aneurysm. The aortic stent graft is patent. 3. Aortic aneurysm sac has slightly enlarged since 2018 and there is a type 2 endoleak on the delayed images. NON-VASCULAR 1. Enhancing lesion along the right side of the bladder. 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 Recommend urology consultation. 2. Colonic diverticulosis. 3. Question some fluid within the inguinal canals. These results were called by telephone at the time of interpretation on 03/22/2020 at 3:19 pm to provider Dr. Horris Latino, who verbally acknowledged these results. Electronically Signed   By: Markus Daft M.D.   On: 03/22/2020 15:22      Procedures: visceral angiogram.   Subjective: Patient is feeling better, no nausea or vomiting, no dyspnea or chest pain.   Discharge Exam: Vitals:   03/26/20 0810 03/26/20 0832  BP:  (!) 96/55  Pulse: 60 69  Resp: (!) 22 20  Temp:  97.9 F (36.6 C)  SpO2: 92% 90%   Vitals:   03/25/20 2043 03/26/20 0504 03/26/20 0810 03/26/20 0832  BP: (!) 85/36 (!) 86/44  (!) 96/55  Pulse: (!) 58 (!) 57 60 69  Resp: 20 18 (!) 22 20  Temp: 97.6 F (36.4 C) 98.2 F (  36.8 C)  97.9 F (36.6 C)  TempSrc: Oral Oral  Oral  SpO2: 94% 93% 92% 90%  Weight:      Height:        General: Not in pain or dyspnea.  Neurology: Awake and alert, non focal  E ENT: no pallor,  no icterus, oral mucosa moist Cardiovascular: No JVD. S1-S2 present, rhythmic, no gallops, rubs, or murmurs. No lower extremity edema. Pulmonary: positive breath sounds bilaterally, adequate air movement, no wheezing, rhonchi or rales. Gastrointestinal. Abdomen with, no organomegaly, non tender, no rebound or guarding Skin. No rashes Musculoskeletal: no joint deformities   The results of significant diagnostics from this hospitalization (including imaging, microbiology, ancillary and laboratory) are listed below for reference.     Microbiology: Recent Results (from the past 240 hour(s))  SARS Coronavirus 2 by RT PCR (hospital order, performed in [facility] hospital lab) Nasopharyngeal Nasopharyngeal Swab     Status: None   Collection Time: 03/22/20  3:30 AM   Specimen: Nasopharyngeal Swab  Result Value Ref Range Status   SARS Coronavirus 2 NEGATIVE NEGATIVE Final    Comment: (NOTE) SARS-CoV-2 target nucleic acids are NOT DETECTED. The SARS-CoV-2 RNA is generally detectable in upper and lower respiratory specimens during the acute phase of infection. The lowest concentration of SARS-CoV-2 viral copies this assay can detect is 250 copies / mL. A negative result does not preclude SARS-CoV-2 infection and should not be used as the sole basis for treatment or other patient management decisions.  A negative result may occur with improper specimen collection / handling, submission of specimen other than nasopharyngeal swab, presence of viral mutation(s) within the areas targeted by this assay, and inadequate number of viral copies (<250 copies / mL). A negative result must be combined with clinical observations, patient history, and epidemiological information. Fact Sheet for Patients:   StrictlyIdeas.no Fact Sheet for Healthcare Providers: BankingDealers.co.za This test is not yet approved or cleared  by the [place] FDA and has been  authorized for detection and/or diagnosis of SARS-CoV-2 by FDA under an Emergency Use Authorization (EUA).  This EUA will remain in effect (meaning this test can be used) for the duration of the COVID-19 declaration under Section 564(b)(1) of the Act, 21 U.S.C. section 360bbb-3(b)(1), unless the authorization is terminated or revoked sooner. Performed at [facility], [place] [address], [place], [place] [address]      Labs: BNP (last 3 results) Recent Labs    12/17/19 2314 12/30/19 1015 02/29/20 1524  BNP 670.0* 140.2* 063.0*   Basic Metabolic Panel: Recent Labs  Lab 03/22/20 0042 03/22/20 0426 03/23/20 0542 03/25/20 0514  NA 140 139 138 136  K 3.9 3.9 4.0 4.2  CL 102 105 104 105  CO2 28 25 23 24   GLUCOSE 112* 107* 103* 96  BUN 23 23 28* 25*  CREATININE 1.39* 1.24 0.99 0.97  CALCIUM 8.6* 8.0* 8.1* 8.0*   Liver Function Tests: No results for input(s): AST, ALT, ALKPHOS, BILITOT, PROT, ALBUMIN in the last 168 hours. No results for input(s): LIPASE, AMYLASE in the last 168 hours. No results for input(s): AMMONIA in the last 168 hours. CBC: Recent Labs  Lab 03/22/20 0042 03/22/20 0426 03/22/20 0757 03/22/20 0757 03/22/20 1244 03/22/20 1733 03/23/20 0542 03/24/20 0517 03/25/20 0514  WBC 10.2   < > 10.7*  --  12.4* 9.9 11.9*  --  9.7  NEUTROABS 6.2  --   --   --   --   --   --   --  5.6  HGB 12.6*   < > 11.0*   < > 11.7* 8.7* 8.4* 7.2* 7.4*  HCT 38.6*   < > 33.3*   < > 35.7* 27.0* 26.3* 22.5* 22.5*  MCV 96.0   < > 97.4  --  97.8 98.5 98.1  --  98.3  PLT 315   < > 276  --  340 253 241  --  245   < > = values in this interval not displayed.   Cardiac Enzymes: No results for input(s): CKTOTAL, CKMB, CKMBINDEX, TROPONINI in the last 168 hours. BNP: Invalid input(s): POCBNP CBG: Recent Labs  Lab 03/24/20 2321 03/25/20 0938 03/25/20 1901 03/26/20 0005 03/26/20 0744  GLUCAP 111* 129* 108* 96 90   D-Dimer No results for input(s): DDIMER in  the last 72 hours. Hgb A1c No results for input(s): HGBA1C in the last 72 hours. Lipid Profile No results for input(s): CHOL, HDL, LDLCALC, TRIG, CHOLHDL, LDLDIRECT in the last 72 hours. Thyroid function studies No results for input(s): TSH, T4TOTAL, T3FREE, THYROIDAB in the last 72 hours.  Invalid input(s): FREET3 Anemia work up No results for input(s): VITAMINB12, FOLATE, FERRITIN, TIBC, IRON, RETICCTPCT in the last 72 hours. Urinalysis No results found for: COLORURINE, APPEARANCEUR, [place], [place], [place], [place], [place], [place], PROTEINUR, UROBILINOGEN, NITRITE, LEUKOCYTESUR Sepsis Labs Invalid input(s): PROCALCITONIN,  WBC,  LACTICIDVEN Microbiology Recent Results (from the past 240 hour(s))  SARS Coronavirus 2 by RT PCR (hospital order, performed in [facility] hospital lab) Nasopharyngeal Nasopharyngeal Swab     Status: None   Collection Time: 03/22/20  3:30 AM   Specimen: Nasopharyngeal Swab  Result Value Ref Range Status   SARS Coronavirus 2 NEGATIVE NEGATIVE Final    Comment: (NOTE) SARS-CoV-2 target nucleic acids are NOT DETECTED. The SARS-CoV-2 RNA is generally detectable in upper and lower respiratory specimens during the acute phase of infection. The lowest concentration of SARS-CoV-2 viral copies this assay can detect is 250 copies / mL. A negative result does not preclude SARS-CoV-2 infection and should not be used as the sole basis for treatment or other patient management decisions.  A negative result may occur with improper specimen collection / handling, submission of specimen other than nasopharyngeal swab, presence of viral mutation(s) within the areas targeted by this assay, and inadequate number of viral copies (<250 copies / mL). A negative result must be combined with clinical observations, patient history, and epidemiological information. Fact Sheet for Patients:   StrictlyIdeas.no Fact Sheet for Healthcare  Providers: BankingDealers.co.za This test is not yet approved or cleared  by the [place] FDA and has been authorized for detection and/or diagnosis of SARS-CoV-2 by FDA under an Emergency Use Authorization (EUA).  This EUA will remain in effect (meaning this test can be used) for the duration of the COVID-19 declaration under Section 564(b)(1) of the Act, 21 U.S.C. section 360bbb-3(b)(1), unless the authorization is terminated or revoked sooner. Performed at [facility], [place] [address] [place], [place] [address]      Time coordinating discharge: 45 minutes  SIGNED:   Tawni Millers, MD  [facility] 03/26/2020, 8:51 AM

## 2020-03-27 LAB — TYPE AND SCREEN
ABO/RH(D): A POS
Antibody Screen: NEGATIVE
Unit division: 0
Unit division: 0

## 2020-03-27 LAB — BPAM RBC
Blood Product Expiration Date: 202106102359
Blood Product Expiration Date: 202106182359
ISSUE DATE / TIME: 202106031026
ISSUE DATE / TIME: 202106040957
Unit Type and Rh: 600
Unit Type and Rh: 6200

## 2020-08-19 ENCOUNTER — Other Ambulatory Visit (HOSPITAL_COMMUNITY): Payer: Self-pay

## 2020-08-19 MED ORDER — OPSUMIT 10 MG PO TABS: 10.0000 mg | ORAL_TABLET | Freq: Every day | ORAL | 3 refills | Status: DC

## 2020-09-07 ENCOUNTER — Other Ambulatory Visit: Payer: Self-pay

## 2020-09-07 ENCOUNTER — Inpatient Hospital Stay (HOSPITAL_COMMUNITY)
Admission: EM | Admit: 2020-09-07 | Discharge: 2020-09-19 | DRG: 871 | Disposition: A | Discharge status: Home Health | Payer: Medicare Other | Attending: Critical Care Medicine | Admitting: Critical Care Medicine

## 2020-09-07 ENCOUNTER — Encounter (HOSPITAL_COMMUNITY): Payer: Self-pay | Admitting: Emergency Medicine

## 2020-09-07 ENCOUNTER — Emergency Department (HOSPITAL_COMMUNITY): Payer: Medicare Other

## 2020-09-07 DIAGNOSIS — E876 Hypokalemia: Secondary | ICD-10-CM | POA: Diagnosis present

## 2020-09-07 DIAGNOSIS — I509 Heart failure, unspecified: Secondary | ICD-10-CM

## 2020-09-07 DIAGNOSIS — J9621 Acute and chronic respiratory failure with hypoxia: Secondary | ICD-10-CM | POA: Diagnosis present

## 2020-09-07 DIAGNOSIS — K5900 Constipation, unspecified: Secondary | ICD-10-CM | POA: Diagnosis present

## 2020-09-07 DIAGNOSIS — J439 Emphysema, unspecified: Secondary | ICD-10-CM | POA: Diagnosis present

## 2020-09-07 DIAGNOSIS — R6521 Severe sepsis with septic shock: Secondary | ICD-10-CM | POA: Diagnosis present

## 2020-09-07 DIAGNOSIS — Z20822 Contact with and (suspected) exposure to covid-19: Secondary | ICD-10-CM | POA: Diagnosis present

## 2020-09-07 DIAGNOSIS — I2510 Atherosclerotic heart disease of native coronary artery without angina pectoris: Secondary | ICD-10-CM | POA: Diagnosis present

## 2020-09-07 DIAGNOSIS — I2781 Cor pulmonale (chronic): Secondary | ICD-10-CM | POA: Diagnosis present

## 2020-09-07 DIAGNOSIS — Z79899 Other long term (current) drug therapy: Secondary | ICD-10-CM

## 2020-09-07 DIAGNOSIS — R509 Fever, unspecified: Secondary | ICD-10-CM | POA: Diagnosis not present

## 2020-09-07 DIAGNOSIS — I700 Atherosclerosis of aorta: Secondary | ICD-10-CM | POA: Diagnosis present

## 2020-09-07 DIAGNOSIS — I350 Nonrheumatic aortic (valve) stenosis: Secondary | ICD-10-CM | POA: Diagnosis present

## 2020-09-07 DIAGNOSIS — C679 Malignant neoplasm of bladder, unspecified: Secondary | ICD-10-CM | POA: Diagnosis present

## 2020-09-07 DIAGNOSIS — G4733 Obstructive sleep apnea (adult) (pediatric): Secondary | ICD-10-CM | POA: Diagnosis present

## 2020-09-07 DIAGNOSIS — I480 Paroxysmal atrial fibrillation: Secondary | ICD-10-CM | POA: Diagnosis present

## 2020-09-07 DIAGNOSIS — I2721 Secondary pulmonary arterial hypertension: Secondary | ICD-10-CM | POA: Diagnosis present

## 2020-09-07 DIAGNOSIS — Z85828 Personal history of other malignant neoplasm of skin: Secondary | ICD-10-CM

## 2020-09-07 DIAGNOSIS — K810 Acute cholecystitis: Secondary | ICD-10-CM | POA: Diagnosis present

## 2020-09-07 DIAGNOSIS — J189 Pneumonia, unspecified organism: Secondary | ICD-10-CM | POA: Diagnosis present

## 2020-09-07 DIAGNOSIS — Z7951 Long term (current) use of inhaled steroids: Secondary | ICD-10-CM

## 2020-09-07 DIAGNOSIS — E785 Hyperlipidemia, unspecified: Secondary | ICD-10-CM | POA: Diagnosis present

## 2020-09-07 DIAGNOSIS — B952 Enterococcus as the cause of diseases classified elsewhere: Secondary | ICD-10-CM | POA: Diagnosis present

## 2020-09-07 DIAGNOSIS — R0902 Hypoxemia: Secondary | ICD-10-CM

## 2020-09-07 DIAGNOSIS — Z7982 Long term (current) use of aspirin: Secondary | ICD-10-CM

## 2020-09-07 DIAGNOSIS — A4159 Other Gram-negative sepsis: Principal | ICD-10-CM | POA: Diagnosis present

## 2020-09-07 DIAGNOSIS — I714 Abdominal aortic aneurysm, without rupture: Secondary | ICD-10-CM | POA: Diagnosis present

## 2020-09-07 DIAGNOSIS — I712 Thoracic aortic aneurysm, without rupture: Secondary | ICD-10-CM | POA: Diagnosis present

## 2020-09-07 DIAGNOSIS — R7989 Other specified abnormal findings of blood chemistry: Secondary | ICD-10-CM | POA: Diagnosis present

## 2020-09-07 DIAGNOSIS — I5082 Biventricular heart failure: Secondary | ICD-10-CM | POA: Diagnosis present

## 2020-09-07 DIAGNOSIS — E039 Hypothyroidism, unspecified: Secondary | ICD-10-CM | POA: Diagnosis present

## 2020-09-07 DIAGNOSIS — I493 Ventricular premature depolarization: Secondary | ICD-10-CM | POA: Diagnosis present

## 2020-09-07 DIAGNOSIS — Z951 Presence of aortocoronary bypass graft: Secondary | ICD-10-CM

## 2020-09-07 DIAGNOSIS — I5033 Acute on chronic diastolic (congestive) heart failure: Secondary | ICD-10-CM | POA: Diagnosis present

## 2020-09-07 DIAGNOSIS — A419 Sepsis, unspecified organism: Secondary | ICD-10-CM

## 2020-09-07 DIAGNOSIS — Z0184 Encounter for antibody response examination: Secondary | ICD-10-CM

## 2020-09-07 DIAGNOSIS — R739 Hyperglycemia, unspecified: Secondary | ICD-10-CM | POA: Diagnosis present

## 2020-09-07 DIAGNOSIS — Z79890 Hormone replacement therapy (postmenopausal): Secondary | ICD-10-CM

## 2020-09-07 DIAGNOSIS — Z825 Family history of asthma and other chronic lower respiratory diseases: Secondary | ICD-10-CM

## 2020-09-07 DIAGNOSIS — D649 Anemia, unspecified: Secondary | ICD-10-CM | POA: Diagnosis present

## 2020-09-07 DIAGNOSIS — Z87891 Personal history of nicotine dependence: Secondary | ICD-10-CM

## 2020-09-07 DIAGNOSIS — J9600 Acute respiratory failure, unspecified whether with hypoxia or hypercapnia: Secondary | ICD-10-CM

## 2020-09-07 LAB — URINALYSIS, ROUTINE W REFLEX MICROSCOPIC
Bacteria, UA: NONE SEEN
Bilirubin Urine: NEGATIVE
Glucose, UA: NEGATIVE mg/dL
Ketones, ur: NEGATIVE mg/dL
Leukocytes,Ua: NEGATIVE
Nitrite: NEGATIVE
Protein, ur: NEGATIVE mg/dL
Specific Gravity, Urine: 1.008 (ref 1.005–1.030)
pH: 5 (ref 5.0–8.0)

## 2020-09-07 MED ORDER — SODIUM CHLORIDE 0.9 % IV SOLN
2.0000 g | Freq: Once | INTRAVENOUS | Status: AC
  Administered 2020-09-08: 2 g via INTRAVENOUS
  Filled 2020-09-07: qty 2

## 2020-09-07 MED ORDER — VANCOMYCIN HCL 1500 MG/300ML IV SOLN
1500.0000 mg | Freq: Once | INTRAVENOUS | Status: AC
  Administered 2020-09-08: 1500 mg via INTRAVENOUS
  Filled 2020-09-07: qty 300

## 2020-09-07 MED ORDER — ACETAMINOPHEN 325 MG PO TABS
650.0000 mg | ORAL_TABLET | Freq: Once | ORAL | Status: AC
  Administered 2020-09-08: 650 mg via ORAL
  Filled 2020-09-07: qty 2

## 2020-09-07 MED ORDER — SODIUM CHLORIDE 0.9 % IV BOLUS
500.0000 mL | Freq: Once | INTRAVENOUS | Status: AC
  Administered 2020-09-08: 500 mL via INTRAVENOUS

## 2020-09-07 NOTE — ED Notes (Signed)
Pt had rectal temp of 101.7

## 2020-09-07 NOTE — ED Triage Notes (Signed)
Patient is alert and oriented x4. Symptoms started around noon. Chills, shaky, nausea/ vomiting. EMS arrived this evening patients lips and finger tips were cyanotic. SPo2 was 65% 3L. Patient wears 2L at baseline. Temp 102.0 18G LAC non rebreather.  Patient has had both covid vaccines.

## 2020-09-07 NOTE — Progress Notes (Signed)
A consult was received from an ED physician for vancomycin and cefepime per pharmacy dosing.  The patient's profile has been reviewed for ht/wt/allergies/indication/available labs.   A one time order has been placed for vancomycin 1500mg  and cefepime 2gm.     Further antibiotics/pharmacy consults should be ordered by admitting physician if indicated.                       Thank you, Dolly Rias RPh 09/07/2020, 11:54 PM

## 2020-09-07 NOTE — ED Provider Notes (Addendum)
[facility] DEPT Provider Note   CSN: [id number] Arrival date & time: 09/07/20  2113     History Chief Complaint  Patient presents with  . Fever  . Nausea  . Emesis    Vincent Robinson is a 86 y.o. male with a h/o of bladder cancer, chronic respiratory failure on 2-3 L home O2, A fib not on anticoagulation, CAD s/p CABG & stent, cough variant asthma, spontaneous pneumothorax, lower diverticular GI bleed, OSA on bipap who presents to the emergency department form home via EMS with a chief complaint of abdominal pain.  The patient developed sudden onset, epigastric pain around noon. He called his daughter earlier tonight complaining of feeling cold and that his hands were shaking. His daughter called EMS and went to his home.   Patient states that he "didn't think his oxygen take was working" so he tried to increase the oxygen level. EMS noted the patient was on 65% on 3L when the arrived, and was he was placed on a non-rebreather. He had one episode of nonbloddy emesis with EMS and was given Zofran. Fever of 102 was documented with EMS, but no anti-pyretics were administered prior to arrival in the ER.   He denies feeling more short of breath.  He has a chronic cough, but states that this has not been worse.  He denies chest pain, back pain, palpitations, leg swelling, abdominal swelling, dizziness, lightheadedness, neck pain or stiffness, rash, dysuria, hematuria, flank pain, melena, hematochezia, hematemesis, diarrhea, or constipation.  He reports that he was feeling at his baseline prior to onset of symptoms this afternoon.  He is followed by urology for bladder cancer and is not currently under going any treatment.  He has been compliant with his home medications.   The history is provided by the patient, medical records and a relative. No language interpreter was used.       Past Medical History:  Diagnosis Date  . AAA (abdominal aortic aneurysm)  ([place])   . Acute respiratory failure ([place]) 11/23/2019  . Asthma   . Basal cell carcinoma   . Chronic respiratory failure ([place])   . COPD (chronic obstructive pulmonary disease) ([place])   . Coronary artery disease   . History of chronic respiratory failure   . Hyperlipidemia   . Hypothyroidism   . Right heart failure ([place])   . Skin cancer 2017  . Sleep apnea     Patient Active Problem List   Diagnosis Date Noted  . Acute cholecystitis 09/08/2020  . Diverticular hemorrhage   . Rectal bleeding 03/22/2020  . Acute GI bleeding 03/22/2020  . Hypokalemia   . Hypotension   . Acute respiratory failure with hypoxia ([place]) 12/18/2019  . Acute on chronic right-sided heart failure ([place]) 12/18/2019  . Pressure injury of skin 11/24/2019  . Chronic respiratory failure ([place])   . Acute on chronic respiratory failure ([place]) 11/23/2019  . Right heart failure ([place])   . S/P CABG (coronary artery bypass graft) 11/22/2019  . Chest pain 11/22/2019  . Atrial fibrillation ([place]) 08/04/2019  . Pulmonary hypertension, unspecified ([place]) 10/11/2017  . Aortic stenosis, moderate 10/11/2017  . Coronary artery disease 08/30/2017  . AAA (abdominal aortic aneurysm) without rupture ([place]) 08/30/2017  . Acute blood loss anemia 06/07/2017  . Hemorrhagic shock ([place]) 06/07/2017  . Lower GI bleed 06/05/2017  . Sleep apnea 06/05/2017  . Hypothyroidism 06/05/2017  . Hyperlipidemia 06/05/2017  . Asthma 06/05/2017  . ILD (interstitial lung disease) ([place]) 05/28/2017  .  Pneumothorax on right 05/20/2017  . COPD (chronic obstructive pulmonary disease) ([place]) 05/20/2017  . Pneumothorax 05/20/2017  . Skin cancer 10/23/2015    Past Surgical History:  Procedure Laterality Date  . bleeding ulcer  2016  . BYPASS AXILLA/BRACHIAL ARTERY  1998  . colon bleed  2012  . COLONOSCOPY N/A 06/07/2017   Procedure: COLONOSCOPY;  Surgeon: Carol Ada, MD;  Location: [facility];  Service: Endoscopy;  Laterality: N/A;  . DEBRIDMENT OF  DECUBITUS ULCER  2002  . IR ANGIOGRAM SELECTIVE EACH ADDITIONAL VESSEL  03/22/2020  . IR ANGIOGRAM VISCERAL SELECTIVE  03/22/2020  . IR PERC CHOLECYSTOSTOMY  09/08/2020  . IR US GUIDE VASC ACCESS RIGHT  03/22/2020  . RIGHT HEART CATH N/A 12/11/2017   Procedure: RIGHT HEART CATH;  Surgeon: Jolaine Artist, MD;  Location: [facility];  Service: Cardiovascular;  Laterality: N/A;  . THORACIC AORTA STENT  2009       Family History  Problem Relation Age of Onset  . Emphysema Mother   . Stroke Father   . Movement disorder Sister   . Aneurysm Brother        Thoracic aorta  . Macular degeneration Brother     Social History   Tobacco Use  . Smoking status: Former Smoker    Packs/day: 0.50    Quit date: 08/06/1961    Years since quitting: 59.1  . Smokeless tobacco: Never Used  Vaping Use  . Vaping Use: Never used  Substance Use Topics  . Alcohol use: Not Currently  . Drug use: Never    Home Medications Prior to Admission medications   Medication Sig Start Date End Date Taking? Authorizing Provider  albuterol (PROVENTIL HFA;VENTOLIN HFA) 108 (90 Base) MCG/ACT inhaler Inhale 1 puff into the lungs 2 (two) times daily. 05/20/17  Yes Mannam, Praveen, MD  fluticasone (VERAMYST) 27.5 MCG/SPRAY nasal spray Place 1 spray into the nose daily as needed for rhinitis.    Yes [provider]  Fluticasone-Salmeterol,sensor, 113-14 MCG/ACT AEPB Inhale 1 puff into the lungs 2 (two) times daily.   Yes [provider]  loratadine (CLARITIN) 10 MG tablet Take 10 mg by mouth daily as needed for allergies.    Yes [provider]  macitentan (OPSUMIT) 10 MG tablet Take 1 tablet (10 mg total) by mouth daily. Needs appt for future refills 08/19/20  Yes Bensimhon, Shaune Pascal, MD  montelukast (SINGULAIR) 10 MG tablet Take 10 mg by mouth at bedtime.   Yes [provider]  pantoprazole (PROTONIX) 20 MG tablet Take 20 mg by mouth 2 (two) times daily.    Yes [provider]  simvastatin (ZOCOR) 10 MG tablet Take 10 mg by mouth at bedtime.   Yes [provider]  torsemide (DEMADEX) 20 MG tablet Take 2 tablets (40 mg total) by mouth daily. 03/03/20  Yes Bensimhon, Shaune Pascal, MD  ferrous sulfate 325 (65 FE) MG tablet Take 1 tablet (325 mg total) by mouth 2 (two) times daily with a meal. Patient not taking: Reported on 09/07/2020 01/28/20   Bensimhon, Shaune Pascal, MD  levothyroxine (SYNTHROID) 100 MCG tablet Take 1 tablet (100 mcg total) by mouth daily at 6 (six) AM. 09/20/20   Carlis Abbott, Venita Sheffield, DO  midodrine (PROAMATINE) 10 MG tablet Take 1.5 tablets (15 mg total) by mouth 3 (three) times daily after meals. 09/19/20   Noemi Chapel P, DO  piperacillin-tazobactam (ZOSYN) 3.375 GM/50ML IVPB Inject 50 mLs (3.375 g total) into the vein every 8 (  eight) hours. 09/19/20   Noemi Chapel P, DO  piperacillin-tazobactam (ZOSYN) IVPB Inject 3.375 g into the vein every 8 (eight) hours for 4 days. Indication:  cholecystitis First Dose: Yes Last Day of Therapy:  09/24/20 Labs - Once weekly:  n/a Labs - Every other week:  n/a Method of administration: Elastomeric (Continuous infusion) Method of administration may be changed at the discretion of home infusion pharmacist based upon assessment of the patient and/or caregiver's ability to self-administer the medication ordered. 09/19/20 09/23/20  Noemi Chapel P, DO  piperacillin-tazobactam (ZOSYN) IVPB Inject 10.125 g into the vein daily for 4 days. As a continuous infusion. Indication:  Cholecystitis  First Dose: No Last Day of Therapy:  09/23/2020 Labs - Once weekly:  CBC/D and BMP, Labs - Every other week:  ESR and CRP Method of administration: Elastomeric (Continuous infusion) Method of administration may be changed at the discretion of home infusion pharmacist based upon assessment of the patient and/or caregiver's ability to self-administer the medication ordered. 09/19/20 09/23/20  Julian Hy, DO  potassium chloride  20 MEQ/15ML (10%) SOLN Take 15 mLs (20 mEq total) by mouth 2 (two) times daily. Patient not taking: Reported on 09/07/2020 12/23/19   [name], MD  sildenafil (REVATIO) 20 MG tablet Take 2 tablets (40 mg total) by mouth 3 (three) times daily. Patient not taking: Reported on 09/07/2020 10/21/19   [name], PA-C    Allergies    Patient has no known allergies.  Review of Systems   Review of Systems  Constitutional: Positive for chills and fever. Negative for appetite change.  HENT: Negative for congestion and sore throat.   Eyes: Negative for visual disturbance.  Respiratory: Negative for cough, shortness of breath and wheezing.   Cardiovascular: Negative for chest pain and palpitations.  Gastrointestinal: Positive for abdominal pain, nausea and vomiting. Negative for anal bleeding, blood in stool, constipation and diarrhea.  Genitourinary: Negative for discharge, dysuria, flank pain, hematuria, penile pain and urgency.  Musculoskeletal: Negative for back pain, myalgias, neck pain and neck stiffness.  Skin: Negative for rash.  Allergic/Immunologic: Negative for immunocompromised state.  Neurological: Negative for dizziness, seizures, syncope, weakness, numbness and headaches.  Psychiatric/Behavioral: Negative for confusion.    Physical Exam Updated Vital Signs BP (!) 105/47   Pulse (!) 46   Temp (!) 97.5 F (36.4 C) (Oral)   Resp 17   Ht '5\' 7"'  (1.702 m)   Wt 68.7 kg   SpO2 100%   BMI 23.72 kg/m   Physical Exam Vitals and nursing note reviewed.  Constitutional:      Appearance: He is well-developed. He is not toxic-appearing or diaphoretic.     Comments: Pleasant, elderly male.  Chronically ill-appearing.  On a nonrebreather.  HENT:     Head: Normocephalic.     Mouth/Throat:     Mouth: Mucous membranes are dry.  Eyes:     Conjunctiva/sclera: Conjunctivae normal.  Cardiovascular:     Rate and Rhythm: Normal rate and regular rhythm.     Heart sounds:  Murmur heard.  No friction rub. No gallop.   Pulmonary:     Effort: Pulmonary effort is normal. No respiratory distress.     Breath sounds: No stridor. No wheezing, rhonchi or rales.     Comments: Lungs are clear to auscultation bilaterally.  No retractions or sensory muscle use. Chest:     Chest wall: No tenderness.  Abdominal:     General: There is no distension.  Palpations: Abdomen is soft. There is no mass.     Tenderness: There is no abdominal tenderness. There is no right CVA tenderness, left CVA tenderness, guarding or rebound.     Hernia: No hernia is present.     Comments: Abdomen soft, nontender, nondistended.  Negative Murphy sign.  No CVA tenderness bilaterally.  No tenderness over McBurney's point.  No peritoneal signs.  Hyperactive bowel sounds in all 4 quadrants.  Musculoskeletal:     Cervical back: Normal range of motion and neck supple.     Right lower leg: No edema.     Left lower leg: No edema.  Lymphadenopathy:     Cervical: No cervical adenopathy.  Skin:    General: Skin is warm and dry.     Capillary Refill: Capillary refill takes less than 2 seconds.  Neurological:     Mental Status: He is alert.     Comments: GCS 15.  Alert and oriented x4.  Answers questions in complete, fluent sentences.  Psychiatric:        Behavior: Behavior normal.     ED Results / Procedures / Treatments   Labs (all labs ordered are listed, but only abnormal results are displayed) Labs Reviewed  URINE CULTURE - Abnormal; Notable for the following components:      Result Value   Culture 3,000 COLONIES/mL ENTEROCOCCUS FAECALIS (*)    Organism ID, Bacteria ENTEROCOCCUS FAECALIS (*)    All other components within normal limits  CULTURE, BLOOD (ROUTINE X 2) - Abnormal; Notable for the following components:   Culture   (*)    Value: STAPHYLOCOCCUS HOMINIS STAPHYLOCOCCUS LUGDUNENSIS THE SIGNIFICANCE OF ISOLATING THIS ORGANISM FROM A SINGLE SET OF BLOOD CULTURES WHEN MULTIPLE SETS  ARE DRAWN IS UNCERTAIN. PLEASE NOTIFY THE MICROBIOLOGY DEPARTMENT WITHIN ONE WEEK IF SPECIATION AND SENSITIVITIES ARE REQUIRED. Performed at [facility], [place] [address], [place], [place] [address]    All other components within normal limits  BLOOD CULTURE ID PANEL (REFLEXED) - BCID2 - Abnormal; Notable for the following components:   Staphylococcus species DETECTED (*)    Staphylococcus lugdunensis DETECTED (*)    Methicillin resistance mecA/C DETECTED (*)    All other components within normal limits  CBC WITH DIFFERENTIAL/PLATELET - Abnormal; Notable for the following components:   WBC 11.1 (*)    RBC 4.15 (*)    Neutro Abs 10.3 (*)    Lymphs Abs 0.3 (*)    All other components within normal limits  BRAIN NATRIURETIC PEPTIDE - Abnormal; Notable for the following components:   B Natriuretic Peptide 234.2 (*)    All other components within normal limits  COMPREHENSIVE METABOLIC PANEL - Abnormal; Notable for the following components:   Potassium 3.3 (*)    Glucose, Bld 113 (*)    BUN 26 (*)    Calcium 8.4 (*)    AST 608 (*)    ALT 297 (*)    Alkaline Phosphatase 227 (*)    Total Bilirubin 2.7 (*)    GFR, Estimated 59 (*)    All other components within normal limits  URINALYSIS, ROUTINE W REFLEX MICROSCOPIC - Abnormal; Notable for the following components:   Hgb urine dipstick MODERATE (*)    All other components within normal limits  COMPREHENSIVE METABOLIC PANEL - Abnormal; Notable for the following components:   Glucose, Bld 129 (*)    BUN 25 (*)    Calcium 7.9 (*)    Albumin 3.2 (*)    AST 439 (*)  ALT 278 (*)    Alkaline Phosphatase 184 (*)    Total Bilirubin 3.7 (*)    GFR, Estimated 56 (*)    All other components within normal limits  LACTIC ACID, PLASMA - Abnormal; Notable for the following components:   Lactic Acid, Venous 2.1 (*)    All other components within normal limits  CBC - Abnormal; Notable for the following components:   WBC 12.6 (*)    RBC  3.78 (*)    Hemoglobin 12.1 (*)    HCT 37.3 (*)    All other components within normal limits  PROTIME-INR - Abnormal; Notable for the following components:   INR 1.3 (*)    All other components within normal limits  CBC - Abnormal; Notable for the following components:   WBC 11.9 (*)    RBC 3.79 (*)    Hemoglobin 12.1 (*)    HCT 37.9 (*)    All other components within normal limits  COMPREHENSIVE METABOLIC PANEL - Abnormal; Notable for the following components:   Sodium 133 (*)    Glucose, Bld 118 (*)    Calcium 7.9 (*)    Total Protein 6.2 (*)    Albumin 2.7 (*)    AST 147 (*)    ALT 167 (*)    Total Bilirubin 4.2 (*)    All other components within normal limits  GLUCOSE, CAPILLARY - Abnormal; Notable for the following components:   Glucose-Capillary 108 (*)    All other components within normal limits  GLUCOSE, CAPILLARY - Abnormal; Notable for the following components:   Glucose-Capillary 109 (*)    All other components within normal limits  GLUCOSE, CAPILLARY - Abnormal; Notable for the following components:   Glucose-Capillary 122 (*)    All other components within normal limits  GLUCOSE, CAPILLARY - Abnormal; Notable for the following components:   Glucose-Capillary 103 (*)    All other components within normal limits  GLUCOSE, CAPILLARY - Abnormal; Notable for the following components:   Glucose-Capillary 132 (*)    All other components within normal limits  COMPREHENSIVE METABOLIC PANEL - Abnormal; Notable for the following components:   Potassium 3.2 (*)    Glucose, Bld 107 (*)    BUN 24 (*)    Calcium 7.8 (*)    Total Protein 5.7 (*)    Albumin 2.4 (*)    AST 64 (*)    ALT 108 (*)    Total Bilirubin 3.0 (*)    All other components within normal limits  CBC - Abnormal; Notable for the following components:   RBC 3.45 (*)    Hemoglobin 11.0 (*)    HCT 34.4 (*)    All other components within normal limits  GLUCOSE, CAPILLARY - Abnormal; Notable for the  following components:   Glucose-Capillary 109 (*)    All other components within normal limits  GLUCOSE, CAPILLARY - Abnormal; Notable for the following components:   Glucose-Capillary 114 (*)    All other components within normal limits  GLUCOSE, CAPILLARY - Abnormal; Notable for the following components:   Glucose-Capillary 102 (*)    All other components within normal limits  GLUCOSE, CAPILLARY - Abnormal; Notable for the following components:   Glucose-Capillary 139 (*)    All other components within normal limits  GLUCOSE, CAPILLARY - Abnormal; Notable for the following components:   Glucose-Capillary 100 (*)    All other components within normal limits  COMPREHENSIVE METABOLIC PANEL - Abnormal; Notable for the following components:  Potassium 3.2 (*)    Chloride 96 (*)    Calcium 8.0 (*)    Albumin 2.8 (*)    ALT 64 (*)    Total Bilirubin 2.4 (*)    All other components within normal limits  CBC - Abnormal; Notable for the following components:   RBC 3.75 (*)    Hemoglobin 12.0 (*)    HCT 36.5 (*)    All other components within normal limits  GLUCOSE, CAPILLARY - Abnormal; Notable for the following components:   Glucose-Capillary 120 (*)    All other components within normal limits  BASIC METABOLIC PANEL - Abnormal; Notable for the following components:   Chloride 95 (*)    Calcium 7.7 (*)    GFR, Estimated 58 (*)    All other components within normal limits  COOXEMETRY PANEL - Abnormal; Notable for the following components:   Total hemoglobin 11.3 (*)    Carboxyhemoglobin 1.6 (*)    All other components within normal limits  BASIC METABOLIC PANEL - Abnormal; Notable for the following components:   Sodium 134 (*)    Potassium 3.4 (*)    Chloride 96 (*)    Creatinine, Ser 1.31 (*)    Calcium 8.0 (*)    GFR, Estimated 50 (*)    All other components within normal limits  CBC - Abnormal; Notable for the following components:   RBC 3.47 (*)    Hemoglobin 11.2 (*)     HCT 33.6 (*)    All other components within normal limits  BRAIN NATRIURETIC PEPTIDE - Abnormal; Notable for the following components:   B Natriuretic Peptide 1,035.8 (*)    All other components within normal limits  COOXEMETRY PANEL - Abnormal; Notable for the following components:   Total hemoglobin 11.5 (*)    All other components within normal limits  BASIC METABOLIC PANEL - Abnormal; Notable for the following components:   Potassium 3.0 (*)    Chloride 95 (*)    Glucose, Bld 115 (*)    Calcium 7.9 (*)    GFR, Estimated 55 (*)    All other components within normal limits  BRAIN NATRIURETIC PEPTIDE - Abnormal; Notable for the following components:   B Natriuretic Peptide 1,215.7 (*)    All other components within normal limits  BASIC METABOLIC PANEL - Abnormal; Notable for the following components:   Sodium 133 (*)    Potassium 2.8 (*)    Chloride 96 (*)    Glucose, Bld 114 (*)    Calcium 7.6 (*)    All other components within normal limits  BASIC METABOLIC PANEL - Abnormal; Notable for the following components:   Sodium 134 (*)    Potassium 3.2 (*)    Glucose, Bld 114 (*)    Calcium 7.6 (*)    All other components within normal limits  BRAIN NATRIURETIC PEPTIDE - Abnormal; Notable for the following components:   B Natriuretic Peptide 370.2 (*)    All other components within normal limits  COOXEMETRY PANEL - Abnormal; Notable for the following components:   Carboxyhemoglobin 1.6 (*)    All other components within normal limits  GLUCOSE, CAPILLARY - Abnormal; Notable for the following components:   Glucose-Capillary 119 (*)    All other components within normal limits  BASIC METABOLIC PANEL - Abnormal; Notable for the following components:   Sodium 133 (*)    Chloride 97 (*)    Creatinine, Ser 1.27 (*)    Calcium 8.1 (*)  GFR, Estimated 52 (*)    All other components within normal limits  CBC - Abnormal; Notable for the following components:   RBC 3.85 (*)     Hemoglobin 12.2 (*)    HCT 37.7 (*)    All other components within normal limits  BASIC METABOLIC PANEL - Abnormal; Notable for the following components:   Sodium 134 (*)    Chloride 97 (*)    Calcium 8.3 (*)    All other components within normal limits  CBG MONITORING, ED - Abnormal; Notable for the following components:   Glucose-Capillary 119 (*)    All other components within normal limits  TROPONIN I (HIGH SENSITIVITY) - Abnormal; Notable for the following components:   Troponin I (High Sensitivity) 31 (*)    All other components within normal limits  TROPONIN I (HIGH SENSITIVITY) - Abnormal; Notable for the following components:   Troponin I (High Sensitivity) 20 (*)    All other components within normal limits  CULTURE, BLOOD (ROUTINE X 2)  RESPIRATORY PANEL BY RT PCR (FLU A&B, COVID)  MRSA PCR SCREENING  AEROBIC/ANAEROBIC CULTURE (SURGICAL/DEEP WOUND)  CULTURE, BLOOD (ROUTINE X 2)  CULTURE, BLOOD (ROUTINE X 2)  LACTIC ACID, PLASMA  PROTIME-INR  APTT  SAR COV2 SEROLOGY (COVID19)AB(IGG),IA  LEGIONELLA PNEUMOPHILA SEROGP 1 UR AG  STREP PNEUMONIAE URINARY ANTIGEN  MAGNESIUM  PHOSPHORUS  LACTIC ACID, PLASMA  PROCALCITONIN  CORTISOL  HEMOGLOBIN A1C  TSH  GLUCOSE, CAPILLARY  GLUCOSE, CAPILLARY  MAGNESIUM  PHOSPHORUS  GLUCOSE, CAPILLARY  GLUCOSE, CAPILLARY  GLUCOSE, CAPILLARY  GLUCOSE, CAPILLARY  GLUCOSE, CAPILLARY  GLUCOSE, CAPILLARY  GLUCOSE, CAPILLARY  GLUCOSE, CAPILLARY  MAGNESIUM  GLUCOSE, CAPILLARY  COOXEMETRY PANEL  MAGNESIUM  MAGNESIUM  I-STAT CREATININE, ED    EKG EKG Interpretation  Date/Time:  Wednesday September 07 2020 21:50:53 EST Ventricular Rate:  88 PR Interval:    QRS Duration: 121 QT Interval:  372 QTC Calculation: 451 R Axis:   70 Text Interpretation: Sinus  rhythm Prolonged PR interval Right bundle branch block Confirmed by Randal Buba, April ([id number]) on 09/07/2020 11:17:15 PM   Radiology No results found.  Procedures .Critical  Care Performed by: Joanne Gavel, PA-C Authorized by: Joanne Gavel, PA-C   Critical care provider statement:    Critical care time (minutes):  55   Critical care time was exclusive of:  Separately billable procedures and treating other patients and teaching time   Critical care was necessary to treat or prevent imminent or life-threatening deterioration of the following conditions:  Shock and respiratory failure   Critical care was time spent personally by me on the following activities:  Ordering and performing treatments and interventions, ordering and review of laboratory studies, ordering and review of radiographic studies, pulse oximetry, re-evaluation of patient's condition, review of old charts, obtaining history from patient or surrogate, examination of patient, evaluation of patient's response to treatment, discussions with consultants, discussions with primary provider and development of treatment plan with patient or surrogate   I assumed direction of critical care for this patient from another provider in my specialty: no     (including critical care time)  Medications Ordered in ED Medications  perflutren lipid microspheres (DEFINITY) IV suspension (2 mLs Intravenous Given 09/08/20 1320)  perflutren lipid microspheres (DEFINITY) IV suspension (has no administration in time range)  0.9 %  sodium chloride infusion ( Intravenous Stopped 09/17/20 1741)  sodium chloride 0.9 % bolus 500 mL (0 mLs Intravenous Stopped 09/08/20 0039)  acetaminophen (TYLENOL) tablet 650  mg (650 mg Oral Given 09/08/20 0203)  ceFEPIme (MAXIPIME) 2 g in sodium chloride 0.9 % 100 mL IVPB (0 g Intravenous Stopped 09/08/20 0140)  vancomycin (VANCOREADY) IVPB 1500 mg/300 mL (0 mg Intravenous Stopped 09/08/20 0428)  sodium chloride 0.9 % bolus 500 mL (0 mLs Intravenous Stopped 09/08/20 0159)  sodium chloride (PF) 0.9 % injection (  Given by Other 09/08/20 0140)  iohexol (OMNIPAQUE) 350 MG/ML injection 100 mL  (100 mLs Intravenous Contrast Given 09/08/20 0100)  lactated ringers bolus 1,000 mL (0 mLs Intravenous Stopped 09/08/20 0553)  piperacillin-tazobactam (ZOSYN) IVPB 3.375 g (0 g Intravenous Stopped 09/08/20 0511)  potassium chloride SA (KLOR-CON) CR tablet 40 mEq (40 mEq Oral Given 09/08/20 0801)  lactated ringers bolus 1,000 mL ( Intravenous Stopped 09/08/20 1147)  lidocaine (XYLOCAINE) 1 % (with pres) injection (  Given by Other 09/08/20 1600)  midazolam (VERSED) injection (0.5 mg Intravenous Given 09/08/20 1607)  iohexol (OMNIPAQUE) 300 MG/ML solution 50 mL (5 mLs Per Tube Contrast Given 09/08/20 1634)  potassium chloride SA (KLOR-CON) CR tablet 40 mEq (40 mEq Oral Given 09/10/20 1430)  potassium chloride SA (KLOR-CON) CR tablet 20 mEq (20 mEq Oral Given 09/11/20 1516)  potassium chloride SA (KLOR-CON) CR tablet 40 mEq (40 mEq Oral Given 09/12/20 1158)  potassium chloride (KLOR-CON) packet 40 mEq (40 mEq Oral Given 09/14/20 0756)  potassium chloride 10 mEq in 50 mL *CENTRAL LINE* IVPB (0 mEq Intravenous Stopped 09/15/20 1347)  potassium chloride (KLOR-CON) packet 40 mEq (40 mEq Oral Given 09/15/20 0801)  senna (SENOKOT) tablet 8.6 mg (8.6 mg Oral Given 09/15/20 0930)  furosemide (LASIX) injection 40 mg (40 mg Intravenous Given 09/15/20 1828)  potassium chloride SA (KLOR-CON) CR tablet 40 mEq (40 mEq Oral Given 09/16/20 0542)  potassium chloride 10 mEq in 50 mL *CENTRAL LINE* IVPB (0 mEq Intravenous Stopped 09/16/20 1032)  potassium chloride SA (KLOR-CON) CR tablet 40 mEq (40 mEq Oral Given 09/16/20 1315)  potassium chloride 10 mEq in 50 mL *CENTRAL LINE* IVPB ( Intravenous Stopped 09/16/20 1701)  magnesium sulfate IVPB 2 g 50 mL (0 g Intravenous Stopped 09/16/20 0920)  potassium chloride 10 LKG/40NU IVPB (  Duplicate 27/25/36 6440)  potassium chloride SA (KLOR-CON) CR tablet 40 mEq (40 mEq Oral Given 09/16/20 1705)  potassium chloride SA (KLOR-CON) CR tablet 40 mEq (40 mEq Oral Given  09/18/20 0924)  potassium chloride SA (KLOR-CON) CR tablet 40 mEq (40 mEq Oral Given 09/19/20 1255)    ED Course  I have reviewed the triage vital signs and the nursing notes.  Pertinent labs & imaging results that were available during my care of the patient were reviewed by me and considered in my medical decision making (see chart for details).  Clinical Course as of Sep 21 827  Wed Sep 07, 2020  2334 RN reports rectal temperature of 101.7. Tylenol and CODE sepsis order set initiated.    [MM]  Thu Sep 08, 2020  0349 Spoke with, Dorian Pod, pharmacist.  Patient is already received cefepime and vancomycin.  CT demonstrated acute cholecystitis.  There is also a questionable left upper lobe pneumonia.  Discussed adding Flagyl onto cefepime and vancomycin.  However, Dorian Pod recommends giving the patient a dose of Zosyn since Flagyl is on national back order.  Zosyn has been ordered.   [MM]    Clinical Course User Index [MM] Gerarda Conklin, Laymond Purser, PA-C   MDM Rules/Calculators/A&P  86 year old with a h/o of bladder cancer, chronic respiratory failure on 2-3 L home O2, A fib not on anticoagulation, CAD s/p CABG & stent, cough variant asthma, spontaneous pneumothorax, lower diverticular GI bleed, OSA on bipap who presents to the emergency department by EMS from home.  He was noted to have oxygen saturation of 65% on his home oxygen set at 3 L (typically 2-3 L).  He was found to be febrile to 102 in route.  He was endorsing upper abdominal pain, nonbloody vomiting x1, and chills.  Symptoms all began less than 24 hours ago.  The patient was seen and independently evaluated by Dr. Randal Buba, attending physician.. Patient arrived on a nonrebreather by EMS. EDP was not notified that patient was on a nonrebreather on arrival until initial evaluation.  On nonrebreather, oxygen saturations in the mid 90s.  He has no tachypnea.  Rectal temp 101.7 and Tylenol was administered.    Initial  lactate was 1.5.  Patient was given 1 L of LR and 1 L of normal saline.  He did not receive a 30 cc/kg bolus since lactate was not greater than 4 and he has a history of heart failure and there was evidence of right heart failure on CT.  When patient's blood pressure did not improve after the second liter, he was started on Levophed due to downtrending blood pressures. Initial blood pressure was 94/58 with a MAP of 69, downtrending to 71/45 with a MAP of 55 at the time when Levophed was initiated.  Hypotension likely from septic shock.  Labs are notable for mild leukocytosis.  AST and ALT are 608 and 297.  Total bilirubin is 2.7. Patient's work-up revealed acute cholecystitis.  Troponin was initially elevated at 35 -->repeat 20.  Doubt ACS as troponin as always minimally elevated.  This may be secondary to previous cardiovascular procedures versus demand.  Chest x-ray was unremarkable.  However on CT there is also a questionable left upper lobe pneumonia.  There was concern that patient may an active PE given his history of atrial fibrillation and untreated bladder malignancy.  However, CT scan was negative for PE.  He received vancomycin, cefepime, and Zosyn in the ER.  He did not require any pain medication.  He has had no further episodes of vomiting.  Patient was able to be transitioned from nonrebreather to BiPAP.  Consulted Dr. Marlou Starks with general surgery.  Acute cholecystitis will likely be treated with a perc drain as patient is a poor surgical candidate.  He recommends to keep the patient n.p.o.  The surgery team will evaluate the patient in the a.m.  Consulted critical care and spoke with Dr. Chase Caller.  Critical care will come to evaluate the patient in the ER for admission.  The patient's daughter, [name], was updated on the patient's clinical status.  She is aware that the patient is critically ill.  I also have updated the patient at length on his current clinical status.  He wishes to  remain a full code at this time.  Dr. Chase Caller has evaluated the patient in the ER and will accept the patient for admission.   The patient appears reasonably stabilized for admission considering the current resources, flow, and capabilities available in the ED at this time, and I doubt any other [facility] requiring further screening and/or treatment in the ED prior to admission.  Final Clinical Impression(s) / ED Diagnoses Final diagnoses:  Septic shock ([place])  Acute cholecystitis  Community acquired pneumonia of left upper lobe  of lung    Rx / DC Orders ED Discharge Orders         Ordered    levothyroxine (SYNTHROID) 100 MCG tablet  Daily        09/19/20 1422    midodrine (PROAMATINE) 10 MG tablet  3 times daily after meals        09/19/20 1422    piperacillin-tazobactam (ZOSYN) 3.375 GM/50ML IVPB  Every 8 hours        09/19/20 1422    Home infusion instructions - [facility]        09/19/20 1422    Method of administration may be changed at the discretion of home infusion pharmacist based upon assessment of the patient and/or caregiver's ability to self-administer the medication ordered        09/19/20 1422    Anaphylaxis Kit: Provided to treat any anaphylactic reaction to the medication being provided to the patient if First Dose or when requested by physician        09/19/20 1422    Change dressing on IV access line weekly and PRN        09/19/20 1422    Flush IV access with Sodium Chloride 0.9% and Heparin 10 units/ml or 100 units/ml        09/19/20 1422    [facility] to provide Cath Flo 72m       Comments: Administer for PICC line occlusion and as ordered by physician for other access device issues.   [facility]20 [facility] to adjust dose for Vancomycin, Aminoglycosides and other anti-infective therapies as requested by physician.        09/19/20 1422    Outpatient Parenteral Antibiotic Therapy Information Antibiotic:  Piperacillin-Tazobactam (Zosyn) IVPB; Indications for use: cholecystitis; End Date: 09/23/2020        09/19/20 1422    piperacillin-tazobactam (ZOSYN) IVPB  Every 8 hours        09/19/20 1422    Home infusion instructions - [facility]        09/19/20 1552    Method of administration may be changed at the discretion of home infusion pharmacist based upon assessment of the patient and/or caregiver's ability to self-administer the medication ordered        09/19/20 1552    Anaphylaxis Kit: Provided to treat any anaphylactic reaction to the medication being provided to the patient if First Dose or when requested by physician        09/19/20 1552    Change dressing on IV access line weekly and PRN        09/19/20 1552    Flush IV access with Sodium Chloride 0.9% and Heparin 10 units/ml or 100 units/ml        09/19/20 1552    [facility] to provide Cath Flo 262m      Comments: Administer for PICC line occlusion and as ordered by physician for other access device issues.   09/19/20 [facility] pharmacist to adjust dose for Vancomycin, Aminoglycosides and other anti-infective therapies as requested by physician.        09/19/20 1552    piperacillin-tazobactam (ZOSYN) IVPB  Daily        09/19/20 1552           McJoanne GavelPA-C 09/08/20 0711    Palumbo, April, MD 09/08/20 0721    McJoanne GavelPA-C 09/20/20 [id number]  Palumbo, April, MD 09/28/20 1103

## 2020-09-08 ENCOUNTER — Inpatient Hospital Stay (HOSPITAL_COMMUNITY): Payer: Medicare Other

## 2020-09-08 ENCOUNTER — Encounter (HOSPITAL_COMMUNITY): Payer: Self-pay

## 2020-09-08 ENCOUNTER — Emergency Department (HOSPITAL_COMMUNITY): Payer: Medicare Other

## 2020-09-08 ENCOUNTER — Inpatient Hospital Stay: Payer: Self-pay

## 2020-09-08 DIAGNOSIS — Z0184 Encounter for antibody response examination: Secondary | ICD-10-CM | POA: Diagnosis not present

## 2020-09-08 DIAGNOSIS — I5033 Acute on chronic diastolic (congestive) heart failure: Secondary | ICD-10-CM | POA: Diagnosis present

## 2020-09-08 DIAGNOSIS — R6520 Severe sepsis without septic shock: Secondary | ICD-10-CM | POA: Diagnosis not present

## 2020-09-08 DIAGNOSIS — I351 Nonrheumatic aortic (valve) insufficiency: Secondary | ICD-10-CM | POA: Diagnosis not present

## 2020-09-08 DIAGNOSIS — I50810 Right heart failure, unspecified: Secondary | ICD-10-CM | POA: Diagnosis not present

## 2020-09-08 DIAGNOSIS — A419 Sepsis, unspecified organism: Secondary | ICD-10-CM

## 2020-09-08 DIAGNOSIS — D649 Anemia, unspecified: Secondary | ICD-10-CM | POA: Diagnosis present

## 2020-09-08 DIAGNOSIS — I272 Pulmonary hypertension, unspecified: Secondary | ICD-10-CM | POA: Diagnosis not present

## 2020-09-08 DIAGNOSIS — B952 Enterococcus as the cause of diseases classified elsewhere: Secondary | ICD-10-CM | POA: Diagnosis present

## 2020-09-08 DIAGNOSIS — I50811 Acute right heart failure: Secondary | ICD-10-CM | POA: Diagnosis not present

## 2020-09-08 DIAGNOSIS — Z951 Presence of aortocoronary bypass graft: Secondary | ICD-10-CM | POA: Diagnosis not present

## 2020-09-08 DIAGNOSIS — I712 Thoracic aortic aneurysm, without rupture: Secondary | ICD-10-CM | POA: Diagnosis present

## 2020-09-08 DIAGNOSIS — R509 Fever, unspecified: Secondary | ICD-10-CM | POA: Diagnosis present

## 2020-09-08 DIAGNOSIS — E876 Hypokalemia: Secondary | ICD-10-CM | POA: Diagnosis present

## 2020-09-08 DIAGNOSIS — I361 Nonrheumatic tricuspid (valve) insufficiency: Secondary | ICD-10-CM | POA: Diagnosis not present

## 2020-09-08 DIAGNOSIS — Z792 Long term (current) use of antibiotics: Secondary | ICD-10-CM | POA: Diagnosis not present

## 2020-09-08 DIAGNOSIS — E785 Hyperlipidemia, unspecified: Secondary | ICD-10-CM | POA: Diagnosis present

## 2020-09-08 DIAGNOSIS — J189 Pneumonia, unspecified organism: Secondary | ICD-10-CM | POA: Diagnosis present

## 2020-09-08 DIAGNOSIS — I493 Ventricular premature depolarization: Secondary | ICD-10-CM | POA: Diagnosis present

## 2020-09-08 DIAGNOSIS — C679 Malignant neoplasm of bladder, unspecified: Secondary | ICD-10-CM | POA: Diagnosis present

## 2020-09-08 DIAGNOSIS — R0689 Other abnormalities of breathing: Secondary | ICD-10-CM | POA: Diagnosis not present

## 2020-09-08 DIAGNOSIS — R6521 Severe sepsis with septic shock: Secondary | ICD-10-CM | POA: Diagnosis present

## 2020-09-08 DIAGNOSIS — I2729 Other secondary pulmonary hypertension: Secondary | ICD-10-CM | POA: Diagnosis not present

## 2020-09-08 DIAGNOSIS — J810 Acute pulmonary edema: Secondary | ICD-10-CM | POA: Diagnosis not present

## 2020-09-08 DIAGNOSIS — I5032 Chronic diastolic (congestive) heart failure: Secondary | ICD-10-CM | POA: Diagnosis not present

## 2020-09-08 DIAGNOSIS — A4159 Other Gram-negative sepsis: Secondary | ICD-10-CM | POA: Diagnosis present

## 2020-09-08 DIAGNOSIS — I2781 Cor pulmonale (chronic): Secondary | ICD-10-CM | POA: Diagnosis present

## 2020-09-08 DIAGNOSIS — I2609 Other pulmonary embolism with acute cor pulmonale: Secondary | ICD-10-CM | POA: Diagnosis not present

## 2020-09-08 DIAGNOSIS — J439 Emphysema, unspecified: Secondary | ICD-10-CM | POA: Diagnosis present

## 2020-09-08 DIAGNOSIS — Z20822 Contact with and (suspected) exposure to covid-19: Secondary | ICD-10-CM | POA: Diagnosis present

## 2020-09-08 DIAGNOSIS — R579 Shock, unspecified: Secondary | ICD-10-CM | POA: Diagnosis not present

## 2020-09-08 DIAGNOSIS — I270 Primary pulmonary hypertension: Secondary | ICD-10-CM | POA: Diagnosis not present

## 2020-09-08 DIAGNOSIS — G4733 Obstructive sleep apnea (adult) (pediatric): Secondary | ICD-10-CM | POA: Diagnosis present

## 2020-09-08 DIAGNOSIS — I952 Hypotension due to drugs: Secondary | ICD-10-CM | POA: Diagnosis not present

## 2020-09-08 DIAGNOSIS — K810 Acute cholecystitis: Secondary | ICD-10-CM | POA: Diagnosis not present

## 2020-09-08 DIAGNOSIS — E039 Hypothyroidism, unspecified: Secondary | ICD-10-CM | POA: Diagnosis present

## 2020-09-08 DIAGNOSIS — J9600 Acute respiratory failure, unspecified whether with hypoxia or hypercapnia: Secondary | ICD-10-CM | POA: Diagnosis not present

## 2020-09-08 DIAGNOSIS — I50812 Chronic right heart failure: Secondary | ICD-10-CM | POA: Diagnosis not present

## 2020-09-08 DIAGNOSIS — I2721 Secondary pulmonary arterial hypertension: Secondary | ICD-10-CM | POA: Diagnosis present

## 2020-09-08 DIAGNOSIS — K819 Cholecystitis, unspecified: Secondary | ICD-10-CM | POA: Diagnosis not present

## 2020-09-08 DIAGNOSIS — Z87891 Personal history of nicotine dependence: Secondary | ICD-10-CM | POA: Diagnosis not present

## 2020-09-08 DIAGNOSIS — I2510 Atherosclerotic heart disease of native coronary artery without angina pectoris: Secondary | ICD-10-CM | POA: Diagnosis present

## 2020-09-08 DIAGNOSIS — J9621 Acute and chronic respiratory failure with hypoxia: Secondary | ICD-10-CM | POA: Diagnosis not present

## 2020-09-08 DIAGNOSIS — I9589 Other hypotension: Secondary | ICD-10-CM | POA: Diagnosis not present

## 2020-09-08 DIAGNOSIS — I50813 Acute on chronic right heart failure: Secondary | ICD-10-CM | POA: Diagnosis not present

## 2020-09-08 DIAGNOSIS — K8309 Other cholangitis: Secondary | ICD-10-CM | POA: Diagnosis not present

## 2020-09-08 DIAGNOSIS — I714 Abdominal aortic aneurysm, without rupture: Secondary | ICD-10-CM | POA: Diagnosis present

## 2020-09-08 HISTORY — PX: IR PERC CHOLECYSTOSTOMY: IMG2326

## 2020-09-08 LAB — GLUCOSE, CAPILLARY
Glucose-Capillary: 99 mg/dL (ref 70–99)
Glucose-Capillary: 96 mg/dL (ref 70–99)
Glucose-Capillary: 108 mg/dL — ABNORMAL HIGH (ref 70–99)

## 2020-09-08 LAB — ECHOCARDIOGRAM COMPLETE
AR max vel: 1.64 cm2
AV Area VTI: 1.6 cm2
AV Area mean vel: 1.7 cm2
AV Mean grad: 16.3 mmHg
AV Peak grad: 28.5 mmHg
Ao pk vel: 2.67 m/s
Area-P 1/2: 5.84 cm2
Calc EF: 47 %
Height: 67 in
S' Lateral: 2.9 cm
Single Plane A2C EF: 57.4 %
Single Plane A4C EF: 37.3 %
Weight: 2480 oz

## 2020-09-08 LAB — COMPREHENSIVE METABOLIC PANEL
ALT: 297 U/L — ABNORMAL HIGH (ref 0–44)
ALT: 278 U/L — ABNORMAL HIGH (ref 0–44)
AST: 608 U/L — ABNORMAL HIGH (ref 15–41)
AST: 439 U/L — ABNORMAL HIGH (ref 15–41)
Albumin: 3.8 g/dL (ref 3.5–5.0)
Albumin: 3.2 g/dL — ABNORMAL LOW (ref 3.5–5.0)
Alkaline Phosphatase: 227 U/L — ABNORMAL HIGH (ref 38–126)
Alkaline Phosphatase: 184 U/L — ABNORMAL HIGH (ref 38–126)
Anion gap: 11 (ref 5–15)
Anion gap: 10 (ref 5–15)
BUN: 26 mg/dL — ABNORMAL HIGH (ref 8–23)
BUN: 25 mg/dL — ABNORMAL HIGH (ref 8–23)
CO2: 24 mmol/L (ref 22–32)
CO2: 24 mmol/L (ref 22–32)
Calcium: 8.4 mg/dL — ABNORMAL LOW (ref 8.9–10.3)
Calcium: 7.9 mg/dL — ABNORMAL LOW (ref 8.9–10.3)
Chloride: 102 mmol/L (ref 98–111)
Chloride: 102 mmol/L (ref 98–111)
Creatinine, Ser: 1.14 mg/dL (ref 0.61–1.24)
Creatinine, Ser: 1.19 mg/dL (ref 0.61–1.24)
GFR, Estimated: 59 mL/min — ABNORMAL LOW (ref >60)
GFR, Estimated: 56 mL/min — ABNORMAL LOW (ref >60)
Glucose, Bld: 113 mg/dL — ABNORMAL HIGH (ref 70–99)
Glucose, Bld: 129 mg/dL — ABNORMAL HIGH (ref 70–99)
Potassium: 3.3 mmol/L — ABNORMAL LOW (ref 3.5–5.1)
Potassium: 3.6 mmol/L (ref 3.5–5.1)
Sodium: 137 mmol/L (ref 135–145)
Sodium: 136 mmol/L (ref 135–145)
Total Bilirubin: 2.7 mg/dL — ABNORMAL HIGH (ref 0.3–1.2)
Total Bilirubin: 3.7 mg/dL — ABNORMAL HIGH (ref 0.3–1.2)
Total Protein: 7.8 g/dL (ref 6.5–8.1)
Total Protein: 6.9 g/dL (ref 6.5–8.1)

## 2020-09-08 LAB — RESPIRATORY PANEL BY RT PCR (FLU A&B, COVID)
Influenza A by PCR: NEGATIVE
Influenza B by PCR: NEGATIVE
SARS Coronavirus 2 by RT PCR: NEGATIVE

## 2020-09-08 LAB — PROTIME-INR
INR: 1.1 (ref 0.8–1.2)
INR: 1.3 — ABNORMAL HIGH (ref 0.8–1.2)
Prothrombin Time: 14 seconds (ref 11.4–15.2)
Prothrombin Time: 15.2 seconds (ref 11.4–15.2)

## 2020-09-08 LAB — TROPONIN I (HIGH SENSITIVITY)
Troponin I (High Sensitivity): 20 ng/L — ABNORMAL HIGH (ref <18)
Troponin I (High Sensitivity): 31 ng/L — ABNORMAL HIGH (ref <18)

## 2020-09-08 LAB — TSH: TSH: 0.827 u[IU]/mL (ref 0.350–4.500)

## 2020-09-08 LAB — CBC WITH DIFFERENTIAL/PLATELET
Abs Immature Granulocytes: 0.04 10*3/uL (ref 0.00–0.07)
Basophils Absolute: 0 10*3/uL (ref 0.0–0.1)
Basophils Relative: 0 %
Eosinophils Absolute: 0 10*3/uL (ref 0.0–0.5)
Eosinophils Relative: 0 %
HCT: 40.1 % (ref 39.0–52.0)
Hemoglobin: 13.4 g/dL (ref 13.0–17.0)
Immature Granulocytes: 0 %
Lymphocytes Relative: 3 %
Lymphs Abs: 0.3 10*3/uL — ABNORMAL LOW (ref 0.7–4.0)
MCH: 32.3 pg (ref 26.0–34.0)
MCHC: 33.4 g/dL (ref 30.0–36.0)
MCV: 96.6 fL (ref 80.0–100.0)
Monocytes Absolute: 0.5 10*3/uL (ref 0.1–1.0)
Monocytes Relative: 5 %
Neutro Abs: 10.3 10*3/uL — ABNORMAL HIGH (ref 1.7–7.7)
Neutrophils Relative %: 92 %
Platelets: 245 10*3/uL (ref 150–400)
RBC: 4.15 MIL/uL — ABNORMAL LOW (ref 4.22–5.81)
RDW: 14.8 % (ref 11.5–15.5)
WBC: 11.1 10*3/uL — ABNORMAL HIGH (ref 4.0–10.5)
nRBC: 0 % (ref 0.0–0.2)

## 2020-09-08 LAB — CBC
HCT: 37.3 % — ABNORMAL LOW (ref 39.0–52.0)
Hemoglobin: 12.1 g/dL — ABNORMAL LOW (ref 13.0–17.0)
MCH: 32 pg (ref 26.0–34.0)
MCHC: 32.4 g/dL (ref 30.0–36.0)
MCV: 98.7 fL (ref 80.0–100.0)
Platelets: 232 10*3/uL (ref 150–400)
RBC: 3.78 MIL/uL — ABNORMAL LOW (ref 4.22–5.81)
RDW: 15.1 % (ref 11.5–15.5)
WBC: 12.6 10*3/uL — ABNORMAL HIGH (ref 4.0–10.5)
nRBC: 0 % (ref 0.0–0.2)

## 2020-09-08 LAB — MRSA PCR SCREENING: MRSA by PCR: NEGATIVE

## 2020-09-08 LAB — HEMOGLOBIN A1C
Hgb A1c MFr Bld: 5.2 % (ref 4.8–5.6)
Mean Plasma Glucose: 102.54 mg/dL

## 2020-09-08 LAB — PROCALCITONIN: Procalcitonin: 6.79 ng/mL

## 2020-09-08 LAB — CBG MONITORING, ED: Glucose-Capillary: 119 mg/dL — ABNORMAL HIGH (ref 70–99)

## 2020-09-08 LAB — BRAIN NATRIURETIC PEPTIDE: B Natriuretic Peptide: 234.2 pg/mL — ABNORMAL HIGH (ref 0.0–100.0)

## 2020-09-08 LAB — LACTIC ACID, PLASMA
Lactic Acid, Venous: 1.3 mmol/L (ref 0.5–1.9)
Lactic Acid, Venous: 1.5 mmol/L (ref 0.5–1.9)
Lactic Acid, Venous: 2.1 mmol/L (ref 0.5–1.9)

## 2020-09-08 LAB — APTT: aPTT: 32 seconds (ref 24–36)

## 2020-09-08 LAB — STREP PNEUMONIAE URINARY ANTIGEN: Strep Pneumo Urinary Antigen: NEGATIVE

## 2020-09-08 LAB — CORTISOL: Cortisol, Plasma: 11.5 ug/dL

## 2020-09-08 LAB — MAGNESIUM: Magnesium: 2 mg/dL (ref 1.7–2.4)

## 2020-09-08 LAB — PHOSPHORUS: Phosphorus: 4.2 mg/dL (ref 2.5–4.6)

## 2020-09-08 LAB — I-STAT CREATININE, ED: Creatinine, Ser: 1.2 mg/dL (ref 0.61–1.24)

## 2020-09-08 MED ORDER — DOCUSATE SODIUM 100 MG PO CAPS: 100.0000 mg | ORAL_CAPSULE | Freq: Two times a day (BID) | ORAL | Status: DC | PRN

## 2020-09-08 MED ORDER — PANTOPRAZOLE SODIUM 40 MG IV SOLR
40.0000 mg | Freq: Every day | INTRAVENOUS | Status: DC
  Administered 2020-09-08 – 2020-09-11 (×4): 40 mg via INTRAVENOUS
  Filled 2020-09-08 (×4): qty 40

## 2020-09-08 MED ORDER — SODIUM CHLORIDE 0.9 % IV SOLN
250.0000 mL | INTRAVENOUS | Status: DC
  Administered 2020-09-08 – 2020-09-16 (×3): 250 mL via INTRAVENOUS

## 2020-09-08 MED ORDER — HYDROCODONE-ACETAMINOPHEN 5-325 MG PO TABS
1.0000 | ORAL_TABLET | ORAL | Status: DC | PRN
  Administered 2020-09-08: 1 via ORAL
  Filled 2020-09-08: qty 1

## 2020-09-08 MED ORDER — ORAL CARE MOUTH RINSE
15.0000 mL | Freq: Two times a day (BID) | OROMUCOSAL | Status: DC
  Administered 2020-09-08 – 2020-09-19 (×17): 15 mL via OROMUCOSAL

## 2020-09-08 MED ORDER — SODIUM CHLORIDE 0.9% FLUSH
10.0000 mL | Freq: Two times a day (BID) | INTRAVENOUS | Status: DC
  Administered 2020-09-08: 40 mL
  Administered 2020-09-09 (×2): 10 mL
  Administered 2020-09-10: 20 mL
  Administered 2020-09-11: 10 mL
  Administered 2020-09-11: 20 mL
  Administered 2020-09-12 – 2020-09-19 (×11): 10 mL

## 2020-09-08 MED ORDER — MIDODRINE HCL 5 MG PO TABS
10.0000 mg | ORAL_TABLET | Freq: Three times a day (TID) | ORAL | Status: DC
  Administered 2020-09-08 – 2020-09-12 (×12): 10 mg via ORAL
  Filled 2020-09-08 (×12): qty 2

## 2020-09-08 MED ORDER — SODIUM CHLORIDE 0.9 % IV BOLUS
500.0000 mL | Freq: Once | INTRAVENOUS | Status: AC
  Administered 2020-09-08: 500 mL via INTRAVENOUS

## 2020-09-08 MED ORDER — FENTANYL CITRATE (PF) 100 MCG/2ML IJ SOLN
INTRAMUSCULAR | Status: AC
  Filled 2020-09-08: qty 2

## 2020-09-08 MED ORDER — ALBUTEROL SULFATE (2.5 MG/3ML) 0.083% IN NEBU
2.5000 mg | INHALATION_SOLUTION | RESPIRATORY_TRACT | Status: DC
  Administered 2020-09-08 (×2): 2.5 mg via RESPIRATORY_TRACT
  Filled 2020-09-08 (×2): qty 3

## 2020-09-08 MED ORDER — NOREPINEPHRINE 4 MG/250ML-% IV SOLN
2.0000 ug/min | INTRAVENOUS | Status: DC
  Administered 2020-09-08: 5 ug/min via INTRAVENOUS
  Filled 2020-09-08: qty 250

## 2020-09-08 MED ORDER — INSULIN ASPART 100 UNIT/ML ~~LOC~~ SOLN
0.0000 [IU] | SUBCUTANEOUS | Status: DC
  Administered 2020-09-09 (×2): 2 [IU] via SUBCUTANEOUS
  Filled 2020-09-08: qty 0.15

## 2020-09-08 MED ORDER — POTASSIUM CHLORIDE CRYS ER 20 MEQ PO TBCR
40.0000 meq | EXTENDED_RELEASE_TABLET | Freq: Once | ORAL | Status: AC
  Administered 2020-09-08: 40 meq via ORAL
  Filled 2020-09-08: qty 2

## 2020-09-08 MED ORDER — MOMETASONE FURO-FORMOTEROL FUM 100-5 MCG/ACT IN AERO
2.0000 | INHALATION_SPRAY | Freq: Two times a day (BID) | RESPIRATORY_TRACT | Status: DC
  Administered 2020-09-09 – 2020-09-18 (×19): 2 via RESPIRATORY_TRACT
  Filled 2020-09-08: qty 8.8

## 2020-09-08 MED ORDER — IPRATROPIUM BROMIDE 0.02 % IN SOLN
0.5000 mg | RESPIRATORY_TRACT | Status: DC
  Administered 2020-09-08 (×2): 0.5 mg via RESPIRATORY_TRACT
  Filled 2020-09-08 (×2): qty 2.5

## 2020-09-08 MED ORDER — IOHEXOL 350 MG/ML SOLN
100.0000 mL | Freq: Once | INTRAVENOUS | Status: AC | PRN
  Administered 2020-09-08: 100 mL via INTRAVENOUS

## 2020-09-08 MED ORDER — ENOXAPARIN SODIUM 30 MG/0.3ML ~~LOC~~ SOLN
30.0000 mg | Freq: Every day | SUBCUTANEOUS | Status: DC
  Administered 2020-09-09 – 2020-09-19 (×11): 30 mg via SUBCUTANEOUS
  Filled 2020-09-08 (×11): qty 0.3

## 2020-09-08 MED ORDER — IPRATROPIUM-ALBUTEROL 0.5-2.5 (3) MG/3ML IN SOLN
3.0000 mL | Freq: Four times a day (QID) | RESPIRATORY_TRACT | Status: DC
  Administered 2020-09-08: 3 mL via RESPIRATORY_TRACT
  Filled 2020-09-08: qty 3

## 2020-09-08 MED ORDER — MIDAZOLAM HCL 2 MG/2ML IJ SOLN
INTRAMUSCULAR | Status: AC | PRN
  Administered 2020-09-08 (×2): 0.5 mg via INTRAVENOUS

## 2020-09-08 MED ORDER — PIPERACILLIN-TAZOBACTAM 3.375 G IVPB 30 MIN: 3.3750 g | Freq: Three times a day (TID) | INTRAVENOUS | Status: DC

## 2020-09-08 MED ORDER — ONDANSETRON HCL 4 MG/2ML IJ SOLN
4.0000 mg | Freq: Four times a day (QID) | INTRAMUSCULAR | Status: DC | PRN
  Administered 2020-09-08 – 2020-09-13 (×2): 4 mg via INTRAVENOUS
  Filled 2020-09-08 (×3): qty 2

## 2020-09-08 MED ORDER — LACTATED RINGERS IV BOLUS
1000.0000 mL | Freq: Once | INTRAVENOUS | Status: AC
  Administered 2020-09-08: 1000 mL via INTRAVENOUS

## 2020-09-08 MED ORDER — SODIUM CHLORIDE (PF) 0.9 % IJ SOLN
INTRAMUSCULAR | Status: AC
  Filled 2020-09-08: qty 50

## 2020-09-08 MED ORDER — NOREPINEPHRINE 4 MG/250ML-% IV SOLN
2.0000 ug/min | INTRAVENOUS | Status: DC
  Administered 2020-09-08 – 2020-09-09 (×3): 10 ug/min via INTRAVENOUS
  Administered 2020-09-09: 2 ug/min via INTRAVENOUS
  Administered 2020-09-09: 9 ug/min via INTRAVENOUS
  Filled 2020-09-08 (×5): qty 250

## 2020-09-08 MED ORDER — PERFLUTREN LIPID MICROSPHERE
1.0000 mL | INTRAVENOUS | Status: AC | PRN
  Administered 2020-09-08: 2 mL via INTRAVENOUS
  Filled 2020-09-08: qty 10

## 2020-09-08 MED ORDER — LIDOCAINE HCL 1 % IJ SOLN
INTRAMUSCULAR | Status: AC
  Filled 2020-09-08: qty 20

## 2020-09-08 MED ORDER — SODIUM CHLORIDE 0.9 % IV SOLN: 250.0000 mL | INTRAVENOUS | Status: DC

## 2020-09-08 MED ORDER — SODIUM CHLORIDE 0.9% FLUSH
5.0000 mL | Freq: Three times a day (TID) | INTRAVENOUS | Status: DC
  Administered 2020-09-08 – 2020-09-19 (×30): 5 mL

## 2020-09-08 MED ORDER — MIDAZOLAM HCL 2 MG/2ML IJ SOLN
INTRAMUSCULAR | Status: AC
  Filled 2020-09-08: qty 2

## 2020-09-08 MED ORDER — SODIUM CHLORIDE 0.9 % IV SOLN
2.0000 g | Freq: Two times a day (BID) | INTRAVENOUS | Status: DC
  Filled 2020-09-08: qty 2

## 2020-09-08 MED ORDER — DEXTROSE IN LACTATED RINGERS 5 % IV SOLN: INTRAVENOUS | Status: DC

## 2020-09-08 MED ORDER — LEVOTHYROXINE SODIUM 100 MCG PO TABS
100.0000 ug | ORAL_TABLET | Freq: Every day | ORAL | Status: DC
  Administered 2020-09-08 – 2020-09-19 (×12): 100 ug via ORAL
  Filled 2020-09-08 (×13): qty 1

## 2020-09-08 MED ORDER — CHLORHEXIDINE GLUCONATE CLOTH 2 % EX PADS
6.0000 | MEDICATED_PAD | Freq: Every day | CUTANEOUS | Status: DC
  Administered 2020-09-08 – 2020-09-19 (×11): 6 via TOPICAL

## 2020-09-08 MED ORDER — IOHEXOL 300 MG/ML  SOLN
50.0000 mL | Freq: Once | INTRAMUSCULAR | Status: AC | PRN
  Administered 2020-09-08: 5 mL

## 2020-09-08 MED ORDER — SODIUM CHLORIDE 0.9% FLUSH
10.0000 mL | INTRAVENOUS | Status: DC | PRN
  Administered 2020-09-08: 10 mL

## 2020-09-08 MED ORDER — POLYETHYLENE GLYCOL 3350 17 G PO PACK: 17.0000 g | PACK | Freq: Every day | ORAL | Status: DC | PRN

## 2020-09-08 MED ORDER — PIPERACILLIN-TAZOBACTAM 3.375 G IVPB
3.3750 g | Freq: Three times a day (TID) | INTRAVENOUS | Status: DC
  Administered 2020-09-08 – 2020-09-19 (×33): 3.375 g via INTRAVENOUS
  Filled 2020-09-08 (×33): qty 50

## 2020-09-08 MED ORDER — PIPERACILLIN-TAZOBACTAM 3.375 G IVPB 30 MIN
3.3750 g | Freq: Once | INTRAVENOUS | Status: AC
  Administered 2020-09-08: 3.375 g via INTRAVENOUS
  Filled 2020-09-08: qty 50

## 2020-09-08 NOTE — Progress Notes (Signed)
[place] Progress Note Patient Name: IMRE VECCHIONE DOB: Dec 20, 1922 MRN: [id number]   Date of Service  09/08/2020  HPI/Events of Note  Request from bedside to review CXR.  eICU Interventions  CXR shows mild pulmonary vascular congestion, patient is making good urine. Discontinue iv fluids.        Kerry Kass Bently Morath 09/08/2020, 9:38 PM

## 2020-09-08 NOTE — Consult Note (Signed)
Chief Complaint: Patient was seen in consultation today for acute cholecystitis/cholecystostomy tube placement.  Referring Physician(s): Saverio Danker ([place])  Supervising Physician: Arne [place]  Patient Status: [facility] - In-pt  History of Present Illness: Vincent Robinson is a 86 y.o. male with a past medical history of hyperlipidemia, CAD s/p CABG 1998, HF, AAA, COPD, chronic respiratory failure, asthma, hypothyroidism, bladder cancer, and OSA. He presented to [facility] ED via EMS 09/07/2020 due to abdominal pain and fever. In ED, patient in septic shock requiring levophed. CT abdomen/pelvis concerning for acute cholecystitis. He was admitted for further management. CCS was consulted who recommended IR consult for possible percutaneous cholecystostomy tube placement. Dr. Vernard Gambles reviewed case and initially requested HIDA to evaluate (due to lack of gallbladder distention on CT), however due to septic shock requiring more pressor support, decision was made to move forward with procedure without HIDA.  CT chest/abdomen/pelvis today: 1. No pulmonary embolism. 2. Morphologic changes of pulmonary arterial hypertension, elevated right heart pressures, and at least some degree of right heart failure. 3. Stable 4.5 cm thoracic aortic aneurysm. Ascending thoracic aortic aneurysm. Recommend semi-annual imaging followup by CTA or MRA and referral to cardiothoracic surgery if not already obtained. This recommendation follows 2010 ACCF/AHA/AATS/ACR/ASA/SCA/SCAI/SIR/STS/SVM Guidelines for the Diagnosis and Management of Patients With Thoracic Aortic Disease. Circulation. 2010; 121: S937-D428. Aortic aneurysm NOS (ICD10-I71.9) 4. Patchy ground-glass pulmonary infiltrate within the left upper lobe, likely infectious or inflammatory. 5. Acute cholecystitis. 6. Stable infrarenal abdominal aortic aneurysm, status post endovascular repair, at 4.5 cm. 7. 2.3 cm enhancing urothelial carcinoma within the right  posterior bladder lumen in the region of the right ureterovesicular junction. No definite evidence of transmural extension or regional lymphadenopathy. 8. Aortic aneurysm NOS (ICD10-I71.9). Aortic Atherosclerosis (ICD10-I70.0).  IR consulted by Saverio Danker, PA-C for possible image-guided percutaneous cholecystostomy tube placement. Patient awake and alert sitting in bed with no complaints at this time. Denies fever, chills, chest pain, dyspnea, abdominal pain, or headache.   Past Medical History:  Diagnosis Date  . AAA (abdominal aortic aneurysm) ([place])   . Acute respiratory failure ([place]) 11/23/2019  . Asthma   . Basal cell carcinoma   . Chronic respiratory failure ([place])   . COPD (chronic obstructive pulmonary disease) ([place])   . Coronary artery disease   . History of chronic respiratory failure   . Hyperlipidemia   . Hypothyroidism   . Right heart failure ([place])   . Skin cancer 2017  . Sleep apnea     Past Surgical History:  Procedure Laterality Date  . bleeding ulcer  2016  . BYPASS AXILLA/BRACHIAL ARTERY  1998  . colon bleed  2012  . COLONOSCOPY N/A 06/07/2017   Procedure: COLONOSCOPY;  Surgeon: Carol Ada, MD;  Location: [facility];  Service: Endoscopy;  Laterality: N/A;  . DEBRIDMENT OF DECUBITUS ULCER  2002  . IR ANGIOGRAM SELECTIVE EACH ADDITIONAL VESSEL  03/22/2020  . IR ANGIOGRAM VISCERAL SELECTIVE  03/22/2020  . IR US GUIDE VASC ACCESS RIGHT  03/22/2020  . RIGHT HEART CATH N/A 12/11/2017   Procedure: RIGHT HEART CATH;  Surgeon: Jolaine Artist, MD;  Location: [facility];  Service: Cardiovascular;  Laterality: N/A;  . THORACIC AORTA STENT  2009    Allergies: Patient has no known allergies.  Medications: Prior to Admission medications   Medication Sig Start Date End Date Taking? Authorizing Provider  albuterol (PROVENTIL HFA;VENTOLIN HFA) 108 (90 Base) MCG/ACT inhaler Inhale 1 puff into the lungs 2 (two) times daily. 05/20/17  Yes Mannam, Praveen, MD    fluticasone (VERAMYST) 27.5 MCG/SPRAY nasal spray Place 1 spray into the nose daily as needed for rhinitis.    Yes [provider]  Fluticasone-Salmeterol,sensor, 113-14 MCG/ACT AEPB Inhale 1 puff into the lungs 2 (two) times daily.   Yes [provider]  levothyroxine (SYNTHROID, LEVOTHROID) 112 MCG tablet Take 112 mcg by mouth daily before breakfast.   Yes [provider]  loratadine (CLARITIN) 10 MG tablet Take 10 mg by mouth daily as needed for allergies.    Yes [provider]  macitentan (OPSUMIT) 10 MG tablet Take 1 tablet (10 mg total) by mouth daily. Needs appt for future refills 08/19/20  Yes Bensimhon, Shaune Pascal, MD  montelukast (SINGULAIR) 10 MG tablet Take 10 mg by mouth at bedtime.   Yes [provider]  pantoprazole (PROTONIX) 20 MG tablet Take 20 mg by mouth 2 (two) times daily.    Yes [provider]  simvastatin (ZOCOR) 10 MG tablet Take 10 mg by mouth at bedtime.   Yes [provider]  torsemide (DEMADEX) 20 MG tablet Take 2 tablets (40 mg total) by mouth daily. 03/03/20  Yes Bensimhon, Shaune Pascal, MD  ferrous sulfate 325 (65 FE) MG tablet Take 1 tablet (325 mg total) by mouth 2 (two) times daily with a meal. Patient not taking: Reported on 09/07/2020 01/28/20   Bensimhon, Shaune Pascal, MD  midodrine (PROAMATINE) 10 MG tablet Take 1 tablet (10 mg total) by mouth 3 (three) times daily. Patient not taking: Reported on 09/07/2020 03/03/20   Bensimhon, Shaune Pascal, MD  potassium chloride 20 MEQ/15ML (10%) SOLN Take 15 mLs (20 mEq total) by mouth 2 (two) times daily. Patient not taking: Reported on 09/07/2020 12/23/19   [name], MD  sildenafil (REVATIO) 20 MG tablet Take 2 tablets (40 mg total) by mouth 3 (three) times daily. Patient not taking: Reported on 09/07/2020 10/21/19   [name], PA-C  spironolactone (ALDACTONE) 25 MG tablet Take 0.5 tablets (12.5 mg total) by mouth daily. Patient not taking: Reported on  09/07/2020 12/24/19   [name], MD     Family History  Problem Relation Age of Onset  . Emphysema Mother   . Stroke Father   . Movement disorder Sister   . Aneurysm Brother        Thoracic aorta  . Macular degeneration Brother     Social History   Socioeconomic History  . Marital status: Married    Spouse name: Not on file  . Number of children: Not on file  . Years of education: Not on file  . Highest education level: Not on file  Occupational History  . Not on file  Tobacco Use  . Smoking status: Former Smoker    Packs/day: 0.50    Quit date: 08/06/1961    Years since quitting: 59.1  . Smokeless tobacco: Never Used  Vaping Use  . Vaping Use: Never used  Substance and Sexual Activity  . Alcohol use: Not Currently  . Drug use: Never  . Sexual activity: Not on file  Other Topics Concern  . Not on file  Social History Narrative  . Not on file   Social Determinants of Health   Financial Resource Strain:   . Difficulty of Paying Living Expenses: Not on file  Food Insecurity:   . Worried About Charity fundraiser in the Last Year: Not on file  . Ran Out of Food in the Last Year: Not  on file  Transportation Needs:   . Lack of Transportation (Medical): Not on file  . Lack of Transportation (Non-Medical): Not on file  Physical Activity:   . Days of Exercise per Week: Not on file  . Minutes of Exercise per Session: Not on file  Stress:   . Feeling of Stress : Not on file  Social Connections:   . Frequency of Communication with Friends and Family: Not on file  . Frequency of Social Gatherings with Friends and Family: Not on file  . Attends Religious Services: Not on file  . Active Member of Clubs or Organizations: Not on file  . Attends Archivist Meetings: Not on file  . Marital Status: Not on file     Review of Systems: A 12 point ROS discussed and pertinent positives are indicated in the HPI above.  All other systems are negative.  Review  of Systems  Constitutional: Negative for chills and fever.  Respiratory: Negative for shortness of breath and wheezing.   Cardiovascular: Negative for chest pain and palpitations.  Gastrointestinal: Negative for abdominal pain.  Neurological: Negative for headaches.  Psychiatric/Behavioral: Negative for behavioral problems and confusion.    Vital Signs: BP (!) 96/44   Pulse 61   Temp (!) 96.4 F (35.8 C) (Axillary)   Resp (!) 26   Ht 5\' 7"  (1.702 m)   Wt 155 lb (70.3 kg)   SpO2 94%   BMI 24.28 kg/m   Physical Exam Vitals and nursing note reviewed.  Constitutional:      General: He is not in acute distress.    Appearance: Normal appearance.  Cardiovascular:     Rate and Rhythm: Normal rate and regular rhythm.     Heart sounds: Normal heart sounds. No murmur heard.   Pulmonary:     Effort: Pulmonary effort is normal. No respiratory distress.     Breath sounds: Normal breath sounds. No wheezing.  Skin:    General: Skin is warm and dry.  Neurological:     Mental Status: He is alert and oriented to person, place, and time.      MD Evaluation Airway: WNL Heart: WNL Abdomen: WNL Chest/ Lungs: WNL ASA  Classification: 3 Mallampati/Airway Score: Two   Imaging: CT Angio Chest PE W and/or Wo Contrast  Result Date: 09/08/2020 CLINICAL DATA:  Fever, chills, leukocytosis, thoracoabdominal aortic aneurysm, respiratory failure EXAM: CT ANGIOGRAPHY CHEST CT ABDOMEN AND PELVIS WITH CONTRAST TECHNIQUE: Multidetector CT imaging of the chest was performed using the standard protocol during bolus administration of intravenous contrast. Multiplanar CT image reconstructions and MIPs were obtained to evaluate the vascular anatomy. Multidetector CT imaging of the abdomen and pelvis was performed using the standard protocol during bolus administration of intravenous contrast. CONTRAST:  141mL OMNIPAQUE IOHEXOL 350 MG/ML SOLN COMPARISON:  CTA chest 11/22/2019, CT abdomen pelvis 07/14/2020  the TI is FINDINGS: CTA CHEST FINDINGS Cardiovascular: There is excellent opacification of the pulmonary arterial tree. No intraluminal filling defect identified to suggest acute pulmonary embolism. Central pulmonary arteries are enlarged in keeping with changes of pulmonary arterial hypertension, unchanged from prior examination. Coronary artery bypass grafting has been performed. There is mild global cardiomegaly with particular enlargement of the right atrium and right ventricle. There is reflux of contrast into the hepatic caval junction in keeping with at least some degree of right heart failure. Extensive calcification of the a aortic valve leaflets. No pericardial effusion. The ascending aorta is dilated measuring 4.5 cm in greatest dimension, stable since  prior examination. The aortic arch measures 3.1 cm in greatest dimension. The descending thoracic aorta at the level of the left atrium measures 2.8 cm in greatest dimension. Mediastinum/Nodes: No pathologic thoracic adenopathy. Esophagus unremarkable. Lungs/Pleura: Imaging is slightly limited by motion artifact. There has developed progressive subpleural reticulation throughout the lungs likely representing mild fibrotic change. There is, however, more focal superimposed ground-glass pulmonary infiltrate within the left upper lobe, likely infectious or inflammatory in the acute setting. No pneumothorax or pleural effusion. Central airways are widely patent. Musculoskeletal: The osseous structures are age-appropriate. No acute bone abnormality peer Review of the MIP images confirms the above findings. CT ABDOMEN and PELVIS FINDINGS Hepatobiliary: The gallbladder is distended, there is marked gallbladder wall thickening, and extensive pericholecystic inflammatory stranding in keeping with changes of acute cholecystitis. Focal intrahepatic biliary ductal dilation within the inferior right hepatic lobe and lateral segment of the left hepatic lobe is unchanged  from prior examination, possibly the sequela of remote trauma or inflammation. The liver is otherwise unremarkable. No extrahepatic biliary ductal dilation peer Pancreas: Unremarkable Spleen: Unremarkable Adrenals/Urinary Tract: The adrenal glands are unremarkable. The kidneys are normal. A 2.3 cm enhancing mural mass is again identified within the right posterior basal wall of the bladder compatible with a a urothelial carcinoma this appears similar to prior examination. The mass does not clearly demonstrate transmural extension at this time. The mass is in close proximity to the right ureterovesicular junction. The bladder is otherwise unremarkable. Stomach/Bowel: Extensive descending and sigmoid colonic diverticulosis. The sigmoid colon is redundant. The stomach, small bowel, and large bowel are otherwise unremarkable. The appendix is not clearly identified, however, there are no secondary signs of appendicitis within the pericecal region. No free intraperitoneal gas or fluid. Vascular/Lymphatic: Infrarenal abdominal aortic aneurysm has undergone endovascular repair. The maximal diameter of the a aneurysm sac is stable at 4.5 cm. Moderate atherosclerotic calcifications seen within the lower extremity arterial outflow. The abdominal vasculature is otherwise unremarkable. No pathologic adenopathy within the abdomen and pelvis. Reproductive: Prostate is unremarkable. Other: Rectum unremarkable Musculoskeletal: Degenerative changes are seen within the a lumbar spine. Osseous structures are age-appropriate. No lytic or blastic bone lesions are seen. Review of the MIP images confirms the above findings. IMPRESSION: No pulmonary embolism. Morphologic changes of pulmonary arterial hypertension, elevated right heart pressures, and at least some degree of right heart failure. Stable 4.5 cm thoracic aortic aneurysm. Ascending thoracic aortic aneurysm. Recommend semi-annual imaging followup by CTA or MRA and referral to  cardiothoracic surgery if not already obtained. This recommendation follows 2010 ACCF/AHA/AATS/ACR/ASA/SCA/SCAI/SIR/STS/SVM Guidelines for the Diagnosis and Management of Patients With Thoracic Aortic Disease. Circulation. 2010; 121: Q734-L937. Aortic aneurysm NOS (ICD10-I71.9) Patchy ground-glass pulmonary infiltrate within the left upper lobe, likely infectious or inflammatory. Acute cholecystitis. Stable infrarenal abdominal aortic aneurysm, status post endovascular repair, at 4.5 cm. 2.3 cm enhancing urothelial carcinoma within the right posterior bladder lumen in the region of the right ureterovesicular junction. No definite evidence of transmural extension or regional lymphadenopathy. Aortic aneurysm NOS (ICD10-I71.9). Aortic Atherosclerosis (ICD10-I70.0). Electronically Signed   By: [name] MD   On: 09/08/2020 03:09   CT ABDOMEN PELVIS W CONTRAST  Result Date: 09/08/2020 CLINICAL DATA:  Fever, chills, leukocytosis, thoracoabdominal aortic aneurysm, respiratory failure EXAM: CT ANGIOGRAPHY CHEST CT ABDOMEN AND PELVIS WITH CONTRAST TECHNIQUE: Multidetector CT imaging of the chest was performed using the standard protocol during bolus administration of intravenous contrast. Multiplanar CT image reconstructions and MIPs were obtained to evaluate the vascular anatomy. Multidetector  CT imaging of the abdomen and pelvis was performed using the standard protocol during bolus administration of intravenous contrast. CONTRAST:  156mL OMNIPAQUE IOHEXOL 350 MG/ML SOLN COMPARISON:  CTA chest 11/22/2019, CT abdomen pelvis 07/14/2020 the TI is FINDINGS: CTA CHEST FINDINGS Cardiovascular: There is excellent opacification of the pulmonary arterial tree. No intraluminal filling defect identified to suggest acute pulmonary embolism. Central pulmonary arteries are enlarged in keeping with changes of pulmonary arterial hypertension, unchanged from prior examination. Coronary artery bypass grafting has been performed.  There is mild global cardiomegaly with particular enlargement of the right atrium and right ventricle. There is reflux of contrast into the hepatic caval junction in keeping with at least some degree of right heart failure. Extensive calcification of the a aortic valve leaflets. No pericardial effusion. The ascending aorta is dilated measuring 4.5 cm in greatest dimension, stable since prior examination. The aortic arch measures 3.1 cm in greatest dimension. The descending thoracic aorta at the level of the left atrium measures 2.8 cm in greatest dimension. Mediastinum/Nodes: No pathologic thoracic adenopathy. Esophagus unremarkable. Lungs/Pleura: Imaging is slightly limited by motion artifact. There has developed progressive subpleural reticulation throughout the lungs likely representing mild fibrotic change. There is, however, more focal superimposed ground-glass pulmonary infiltrate within the left upper lobe, likely infectious or inflammatory in the acute setting. No pneumothorax or pleural effusion. Central airways are widely patent. Musculoskeletal: The osseous structures are age-appropriate. No acute bone abnormality peer Review of the MIP images confirms the above findings. CT ABDOMEN and PELVIS FINDINGS Hepatobiliary: The gallbladder is distended, there is marked gallbladder wall thickening, and extensive pericholecystic inflammatory stranding in keeping with changes of acute cholecystitis. Focal intrahepatic biliary ductal dilation within the inferior right hepatic lobe and lateral segment of the left hepatic lobe is unchanged from prior examination, possibly the sequela of remote trauma or inflammation. The liver is otherwise unremarkable. No extrahepatic biliary ductal dilation peer Pancreas: Unremarkable Spleen: Unremarkable Adrenals/Urinary Tract: The adrenal glands are unremarkable. The kidneys are normal. A 2.3 cm enhancing mural mass is again identified within the right posterior basal wall of the  bladder compatible with a a urothelial carcinoma this appears similar to prior examination. The mass does not clearly demonstrate transmural extension at this time. The mass is in close proximity to the right ureterovesicular junction. The bladder is otherwise unremarkable. Stomach/Bowel: Extensive descending and sigmoid colonic diverticulosis. The sigmoid colon is redundant. The stomach, small bowel, and large bowel are otherwise unremarkable. The appendix is not clearly identified, however, there are no secondary signs of appendicitis within the pericecal region. No free intraperitoneal gas or fluid. Vascular/Lymphatic: Infrarenal abdominal aortic aneurysm has undergone endovascular repair. The maximal diameter of the a aneurysm sac is stable at 4.5 cm. Moderate atherosclerotic calcifications seen within the lower extremity arterial outflow. The abdominal vasculature is otherwise unremarkable. No pathologic adenopathy within the abdomen and pelvis. Reproductive: Prostate is unremarkable. Other: Rectum unremarkable Musculoskeletal: Degenerative changes are seen within the a lumbar spine. Osseous structures are age-appropriate. No lytic or blastic bone lesions are seen. Review of the MIP images confirms the above findings. IMPRESSION: No pulmonary embolism. Morphologic changes of pulmonary arterial hypertension, elevated right heart pressures, and at least some degree of right heart failure. Stable 4.5 cm thoracic aortic aneurysm. Ascending thoracic aortic aneurysm. Recommend semi-annual imaging followup by CTA or MRA and referral to cardiothoracic surgery if not already obtained. This recommendation follows 2010 ACCF/AHA/AATS/ACR/ASA/SCA/SCAI/SIR/STS/SVM Guidelines for the Diagnosis and Management of Patients With Thoracic Aortic Disease. Circulation. 2010;  121: [id number]. Aortic aneurysm NOS (ICD10-I71.9) Patchy ground-glass pulmonary infiltrate within the left upper lobe, likely infectious or inflammatory. Acute  cholecystitis. Stable infrarenal abdominal aortic aneurysm, status post endovascular repair, at 4.5 cm. 2.3 cm enhancing urothelial carcinoma within the right posterior bladder lumen in the region of the right ureterovesicular junction. No definite evidence of transmural extension or regional lymphadenopathy. Aortic aneurysm NOS (ICD10-I71.9). Aortic Atherosclerosis (ICD10-I70.0). Electronically Signed   By: [name] MD   On: 09/08/2020 03:09   DG Chest Portable 1 View  Result Date: 09/08/2020 CLINICAL DATA:  Initial evaluation for acute shortness of breath. EXAM: PORTABLE CHEST 1 VIEW COMPARISON:  Prior radiograph from 12/21/2019. FINDINGS: Median sternotomy wires underlying CABG markers noted. Mild cardiomegaly, stable. Mediastinal silhouette within normal limits. Lungs are hypoinflated with probable underlying interstitial lung disease and/or fibrotic changes. No focal infiltrates or consolidative opacity. Mild perihilar vascular congestion without overt pulmonary edema. No pleural effusion. No pneumothorax. No acute osseous finding. IMPRESSION: 1. Shallow lung inflation with probable underlying interstitial lung disease and/or fibrotic changes. 2. Mild perihilar vascular congestion without overt pulmonary edema. 3. No other active cardiopulmonary disease. Electronically Signed   By: Jeannine Boga M.D.   On: 09/08/2020 01:09   [place] EKG SITE RITE  Result Date: 09/08/2020 If Site Rite image not attached, placement could not be confirmed due to current cardiac rhythm.   Labs:  CBC: Recent Labs    03/23/20 0542 03/24/20 0517 03/25/20 0514 03/26/20 0919 09/07/20 2320 09/08/20 0740  WBC 11.9*  --  9.7  --  11.1* 12.6*  HGB 8.4*   < > 7.4* 8.6* 13.4 12.1*  HCT 26.3*   < > 22.5* 26.5* 40.1 37.3*  PLT 241  --  245  --  245 232   < > = values in this interval not displayed.    COAGS: Recent Labs    03/22/20 2008 09/07/20 2355 09/08/20 0740  INR 1.2 1.1 1.3*  APTT  --  32  --      BMP: Recent Labs    03/22/20 0042 03/22/20 0042 03/22/20 0426 03/22/20 0426 03/23/20 0542 03/23/20 0542 03/25/20 0514 09/07/20 2320 09/08/20 0011 09/08/20 0740  NA 140   < > 139   < > 138  --  136 137  --  136  K 3.9   < > 3.9   < > 4.0  --  4.2 3.3*  --  3.6  CL 102   < > 105   < > 104  --  105 102  --  102  CO2 28   < > 25   < > 23  --  24 24  --  24  GLUCOSE 112*   < > 107*   < > 103*  --  96 113*  --  129*  BUN 23   < > 23   < > 28*  --  25* 26*  --  25*  CALCIUM 8.6*   < > 8.0*   < > 8.1*  --  8.0* 8.4*  --  7.9*  CREATININE 1.39*   < > 1.24   < > 0.99   < > 0.97 1.14 1.20 1.19  GFRNONAA 43*   < > 49*   < > >60  --  >60 59*  --  56*  GFRAA 50*  --  57*  --  >60  --  >60  --   --   --    < > = values  in this interval not displayed.    LIVER FUNCTION TESTS: Recent Labs    12/18/19 0135 12/19/19 0213 12/22/19 0502 12/23/19 0746 09/07/20 2320 09/08/20 0740  BILITOT 0.6  --   --   --  2.7* 3.7*  AST 19  --   --   --  608* 439*  ALT 11  --   --   --  297* 278*  ALKPHOS 67  --   --   --  227* 184*  PROT 7.0  --   --   --  7.8 6.9  ALBUMIN 2.6*   < > 2.6* 2.8* 3.8 3.2*   < > = values in this interval not displayed.     Assessment and Plan:  Sepsis secondary to acute cholecystitis. Plan for image-guided percutaneous cholecystostomy tube placement in IR tentatively for today pending IR scheduling. Patient is NPO. Afebrile. Lovenox held per IR protocol. INR 1.3 today.  Risks and benefits were discussed with the patient including, but not limited to, bleeding, infection, gallbladder perforation, bile leak, sepsis or even death. All of the patient's questions were answered, patient is agreeable to proceed. Consent signed and in chart.   Thank you for this interesting consult.  I greatly enjoyed meeting Vincent Robinson and look forward to participating in their care.  A copy of this report was sent to the requesting provider on this date.  Electronically  Signed: Earley Abide, PA-C 09/08/2020, 3:48 PM   I spent a total of 40 Minutes in face to face in clinical consultation, greater than 50% of which was counseling/coordinating care for acute cholecystitis/cholecystostomy tube placement.

## 2020-09-08 NOTE — H&P (Signed)
NAME:  Vincent Robinson, MRN:  [id number], DOB:  [birth year], LOS: 0 ADMISSION DATE:  09/07/2020, CONSULTATION DATE:  09/08/20 REFERRING MD:  Dr April P, CHIEF COMPLAINT:  Septic shock from cholecystitis  Brief History   septioc shock, resp failure, acute cholecystitis, new discovery urothelial cancer  History of present illness    89 year full code and cognitively intact male with medical problems listed below. He is on home palliative care (not hospice). Doing ADLs as of Spring/summer 2021. Originally from [place]. [facility] vet. Relocated in 2021 to [place] from [place]. S/p CABG 1998. S/p infrarenal endovascular repair of AAA 2009. 2018 s/p diverticular bleed. 2018 - spont ptx hx.  Echo 2019 with RVSP 62mmHg. [place] Feb 2019 with PCWP 10, PVR 8 and mPAP 44. 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 The repeat high res resolution CT shows stability of findings from 2016 - 2019.) and associated emphysema . Last seen Jan 2021. Also OSA on cPAP   Presented late hours of 09/07/20 with SIRS (temp 102F) and sepsis syndrome. Had worsening hypoxemia (baseline 2l [place]) and needing NRB but later [facility] after failing to tolerate bipap. CTA ruled oput PE but shouwed LUL infiltrate. CTAb showed evidence of acute cholecystitis. He was in septic shock and needing levophed via PIV. CCM asked to admit. PEr EDP - Dr Marlou Starks of [facility] aware of patient and will be seen > 7am on 09/08/20 day shift for consideration of perc drain. Patient is full code  Notable other admit finding  - urothelial cancer on bladder wall seen posteriorly without perforation (not mentioned in past hx) - no hx of covid vaccination on chart  Past Medical History     has a past medical history of AAA (abdominal aortic aneurysm) ([place]), Acute respiratory failure ([place]) (11/23/2019), Asthma, Basal cell carcinoma, Chronic respiratory failure ([place]), COPD (chronic obstructive pulmonary disease) ([place]), Coronary artery disease, History of  chronic respiratory failure, Hyperlipidemia, Hypothyroidism, Right heart failure ([place]), Skin cancer (2017), and Sleep apnea.   reports that he quit smoking about 59 years ago. He smoked 0.50 packs per day. He has never used smokeless tobacco.  Past Surgical History:  Procedure Laterality Date  . bleeding ulcer  2016  . BYPASS AXILLA/BRACHIAL ARTERY  1998  . colon bleed  2012  . COLONOSCOPY N/A 06/07/2017   Procedure: COLONOSCOPY;  Surgeon: Carol Ada, MD;  Location: [facility];  Service: Endoscopy;  Laterality: N/A;  . DEBRIDMENT OF DECUBITUS ULCER  2002  . IR ANGIOGRAM SELECTIVE EACH ADDITIONAL VESSEL  03/22/2020  . IR ANGIOGRAM VISCERAL SELECTIVE  03/22/2020  . IR US GUIDE VASC ACCESS RIGHT  03/22/2020  . RIGHT HEART CATH N/A 12/11/2017   Procedure: RIGHT HEART CATH;  Surgeon: Jolaine Artist, MD;  Location: [place] CV LAB;  Service: Cardiovascular;  Laterality: N/A;  . THORACIC AORTA STENT  2009    No Known Allergies  Immunization History  Administered Date(s) Administered  . Influenza, High Dose Seasonal PF 07/27/2016, 08/05/2017, 08/10/2019  . Pneumococcal Conjugate-13 08/05/2017  . Pneumococcal Polysaccharide-23 10/22/2002, 08/08/2018  . Pneumococcal-Unspecified 10/22/2002    Family History  Problem Relation Age of Onset  . Emphysema Mother   . Stroke Father   . Movement disorder Sister   . Aneurysm Brother        Thoracic aorta  . Macular degeneration Brother      Current Facility-Administered Medications:  .  0.9 %  sodium chloride infusion, 250 mL, Intravenous,  Continuous, McDonald, Mia A, PA-C .  norepinephrine (LEVOPHED) 4mg  in 225mL premix infusion, 2-10 mcg/min, Intravenous, Titrated, McDonald, Mia A, PA-C, Last Rate: 28.1 mL/hr at 09/08/20 0448, 7.5 mcg/min at 09/08/20 0448  Current Outpatient Medications:  .  albuterol (PROVENTIL HFA;VENTOLIN HFA) 108 (90 Base) MCG/ACT inhaler, Inhale 1 puff into the lungs 2 (two) times daily., Disp: 1 Inhaler, Rfl:  2 .  fluticasone (VERAMYST) 27.5 MCG/SPRAY nasal spray, Place 1 spray into the nose daily as needed for rhinitis. , Disp: , Rfl:  .  Fluticasone-Salmeterol,sensor, 113-14 MCG/ACT AEPB, Inhale 1 puff into the lungs 2 (two) times daily., Disp: , Rfl:  .  levothyroxine (SYNTHROID, LEVOTHROID) 112 MCG tablet, Take 112 mcg by mouth daily before breakfast., Disp: , Rfl:  .  loratadine (CLARITIN) 10 MG tablet, Take 10 mg by mouth daily as needed for allergies. , Disp: , Rfl:  .  macitentan (OPSUMIT) 10 MG tablet, Take 1 tablet (10 mg total) by mouth daily. Needs appt for future refills, Disp: 30 tablet, Rfl: 3 .  montelukast (SINGULAIR) 10 MG tablet, Take 10 mg by mouth at bedtime., Disp: , Rfl:  .  pantoprazole (PROTONIX) 20 MG tablet, Take 20 mg by mouth 2 (two) times daily. , Disp: , Rfl:  .  simvastatin (ZOCOR) 10 MG tablet, Take 10 mg by mouth at bedtime., Disp: , Rfl:  .  torsemide (DEMADEX) 20 MG tablet, Take 2 tablets (40 mg total) by mouth daily., Disp: 60 tablet, Rfl: 6 .  ferrous sulfate 325 (65 FE) MG tablet, Take 1 tablet (325 mg total) by mouth 2 (two) times daily with a meal. (Patient not taking: Reported on 09/07/2020), Disp: 60 tablet, Rfl: 3 .  midodrine (PROAMATINE) 10 MG tablet, Take 1 tablet (10 mg total) by mouth 3 (three) times daily. (Patient not taking: Reported on 09/07/2020), Disp: 90 tablet, Rfl: 6 .  potassium chloride 20 MEQ/15ML (10%) SOLN, Take 15 mLs (20 mEq total) by mouth 2 (two) times daily. (Patient not taking: Reported on 09/07/2020), Disp: 900 mL, Rfl: 1 .  sildenafil (REVATIO) 20 MG tablet, Take 2 tablets (40 mg total) by mouth 3 (three) times daily. (Patient not taking: Reported on 09/07/2020), Disp: 540 tablet, Rfl: 3 .  spironolactone (ALDACTONE) 25 MG tablet, Take 0.5 tablets (12.5 mg total) by mouth daily. (Patient not taking: Reported on 09/07/2020), Disp: 30 tablet, Rfl: [facility]   09/07/2020 - ER 11/18- admit by CCM  Consults:   x  Procedures:  x  Significant Diagnostic Tests:  x  Micro Data:   11/17 - blood    Antimicrobials:   11/17 - vanc x 1 11/17 - Cefepime >> 11/18 - Zosyn x 1      Interim history/subjective:  11/18 - seen in [place] eR bed 15  Objective   Blood pressure (!) 86/50, pulse (!) 52, temperature 98 F (36.7 C), temperature source Oral, resp. rate 16, SpO2 90 %.       No intake or output data in the 24 hours ending 09/08/20 0621 There were no vitals filed for this visit.  Examination: General: edlerly male in bed in ER. No distrress HENT: HHFNC + Lungs: CTA bilaterally. Overall dminished. No wheeze. No crack;es Cardiovascular: Tachy Abdomen: soft. Non tender Extremities: intact. No cyanosis. No clubbing. No eema Neuro: alert and oritened x 3 GU: not examined  Resolved Hospital Problem list   x  Assessment & Plan:  ASSESSMENT / PLAN:   A:  Hx of remote spontaneous pneumothorax Known emphysema and post inflammatory ILD (dr Vaughan Browner) On chroniic o2 at home OSA on cPAP at hom Severe [pulmonary hypertension - on macitentan  09/08/2020 -> Admissiin with acute on chronic resp failure. Decent wob but needing HHFNC. Did not tolerate bippa  P:   HHFNC for pulse ox > 88% intjubate if worse Hold macitentan for now BD +   A:   Neuro intact P:   monitor     A:   Circulatory septic shock  P:  Levophed for MAP > 65   A: Known D-CHF and cor pulmonale (sees Dr Jeffie Pollock)  P: Serial trop Check echo Hold home diuresis   INFECTIOUS A:   Acute cholecystici +/0- Pneumonia NOS P:   Broad abx - cefepime RVP Urine strep Urine leg Check covid igg -> if negative -> consider vaccination in the hospital but when better  A:  Incidental discovery 11/17 of urothelial cancer P:  Needs urollogy consult prior to dc or post dc   A:  Mild low k  P: replete    A:   Acute cholecystitis  P:   Await surgery input    A:  At risk anemia of  ICU   P:  - PRBC for hgb </= 6.9gm%    - exceptions are   -  if ACS susepcted/confirmed then transfuse for hgb </= 8.0gm%,  or    -  active bleeding with hemodynamic instability, then transfuse regardless of hemoglobin value   At at all times try to transfuse 1 unit prbc as possible with exception of active hemorrhage   Hypothyroidsism - on syntrhoid at hoome  Plan  - synthroid - check TSH   Best practice:  Diet: NPO but meds Pain/Anxiety/Delirium protocol (if indicated): none VAP protocol (if indicated): hob > 30 dec DVT prophylaxis: heparin GI prophylaxis: ppo Glucose control: ssi Mobility: bed rest Code Status: full code per EDP Family Communication:  Disposition: Called daughter [name] [phone] at 6.56am 09/08/20     ATTESTATION & SIGNATURE   The patient Vincent Robinson is critically ill with multiple organ systems failure and requires high complexity decision making for assessment and support, frequent evaluation and titration of therapies, application of advanced monitoring technologies and extensive interpretation of multiple databases.   Critical Care Time devoted to patient care services described in this note is  60  Minutes. This time reflects time of care of this signee Dr Brand Males. This critical care time does not reflect procedure time, or teaching time or supervisory time of PA/NP/Med student/Med Resident etc but could involve care discussion time     Dr. Brand Males, M.D., [facility].C.P Pulmonary and Critical Care Medicine Staff Physician [place] [facility] Pager: [phone], If no answer or between  15:00h - 7:00h: call [phone]  09/08/2020 6:21 AM    LABS    PULMONARY No results for input(s): PHART, PCO2ART, PO2ART, HCO3, TCO2, O2SAT in the last 168 hours.  Invalid input(s): PCO2, PO2  CBC Recent Labs  Lab 09/07/20 2320  HGB 13.4  HCT 40.1  WBC 11.1*  PLT 245     COAGULATION Recent Labs  Lab 09/07/20 2355  INR 1.1    CARDIAC  No results for input(s): TROPONINI in the last 168 hours. No results for input(s): PROBNP in the last 168 hours.   CHEMISTRY Recent Labs  Lab 09/07/20 2320 09/08/20 0011  NA 137  --  K 3.3*  --   CL 102  --   CO2 24  --   GLUCOSE 113*  --   BUN 26*  --   CREATININE 1.14 1.20  CALCIUM 8.4*  --    CrCl cannot be calculated (Unknown ideal weight.).   LIVER Recent Labs  Lab 09/07/20 2320 09/07/20 2355  AST 608*  --   ALT 297*  --   ALKPHOS 227*  --   BILITOT 2.7*  --   PROT 7.8  --   ALBUMIN 3.8  --   INR  --  1.1     INFECTIOUS Recent Labs  Lab 09/07/20 2355  LATICACIDVEN 1.5     ENDOCRINE CBG (last 3)  No results for input(s): GLUCAP in the last 72 hours.       IMAGING x48h  - image(s) personally visualized  -   highlighted in bold CT Angio Chest PE W and/or Wo Contrast  Result Date: 09/08/2020 CLINICAL DATA:  Fever, chills, leukocytosis, thoracoabdominal aortic aneurysm, respiratory failure EXAM: CT ANGIOGRAPHY CHEST CT ABDOMEN AND PELVIS WITH CONTRAST TECHNIQUE: Multidetector CT imaging of the chest was performed using the standard protocol during bolus administration of intravenous contrast. Multiplanar CT image reconstructions and MIPs were obtained to evaluate the vascular anatomy. Multidetector CT imaging of the abdomen and pelvis was performed using the standard protocol during bolus administration of intravenous contrast. CONTRAST:  129mL OMNIPAQUE IOHEXOL 350 MG/ML SOLN COMPARISON:  CTA chest 11/22/2019, CT abdomen pelvis 07/14/2020 the TI is FINDINGS: CTA CHEST FINDINGS Cardiovascular: There is excellent opacification of the pulmonary arterial tree. No intraluminal filling defect identified to suggest acute pulmonary embolism. Central pulmonary arteries are enlarged in keeping with changes of pulmonary arterial hypertension, unchanged from prior examination. Coronary artery  bypass grafting has been performed. There is mild global cardiomegaly with particular enlargement of the right atrium and right ventricle. There is reflux of contrast into the hepatic caval junction in keeping with at least some degree of right heart failure. Extensive calcification of the a aortic valve leaflets. No pericardial effusion. The ascending aorta is dilated measuring 4.5 cm in greatest dimension, stable since prior examination. The aortic arch measures 3.1 cm in greatest dimension. The descending thoracic aorta at the level of the left atrium measures 2.8 cm in greatest dimension. Mediastinum/Nodes: No pathologic thoracic adenopathy. Esophagus unremarkable. Lungs/Pleura: Imaging is slightly limited by motion artifact. There has developed progressive subpleural reticulation throughout the lungs likely representing mild fibrotic change. There is, however, more focal superimposed ground-glass pulmonary infiltrate within the left upper lobe, likely infectious or inflammatory in the acute setting. No pneumothorax or pleural effusion. Central airways are widely patent. Musculoskeletal: The osseous structures are age-appropriate. No acute bone abnormality peer Review of the MIP images confirms the above findings. CT ABDOMEN and PELVIS FINDINGS Hepatobiliary: The gallbladder is distended, there is marked gallbladder wall thickening, and extensive pericholecystic inflammatory stranding in keeping with changes of acute cholecystitis. Focal intrahepatic biliary ductal dilation within the inferior right hepatic lobe and lateral segment of the left hepatic lobe is unchanged from prior examination, possibly the sequela of remote trauma or inflammation. The liver is otherwise unremarkable. No extrahepatic biliary ductal dilation peer Pancreas: Unremarkable Spleen: Unremarkable Adrenals/Urinary Tract: The adrenal glands are unremarkable. The kidneys are normal. A 2.3 cm enhancing mural mass is again identified within  the right posterior basal wall of the bladder compatible with a a urothelial carcinoma this appears similar to prior examination. The mass does not clearly demonstrate  transmural extension at this time. The mass is in close proximity to the right ureterovesicular junction. The bladder is otherwise unremarkable. Stomach/Bowel: Extensive descending and sigmoid colonic diverticulosis. The sigmoid colon is redundant. The stomach, small bowel, and large bowel are otherwise unremarkable. The appendix is not clearly identified, however, there are no secondary signs of appendicitis within the pericecal region. No free intraperitoneal gas or fluid. Vascular/Lymphatic: Infrarenal abdominal aortic aneurysm has undergone endovascular repair. The maximal diameter of the a aneurysm sac is stable at 4.5 cm. Moderate atherosclerotic calcifications seen within the lower extremity arterial outflow. The abdominal vasculature is otherwise unremarkable. No pathologic adenopathy within the abdomen and pelvis. Reproductive: Prostate is unremarkable. Other: Rectum unremarkable Musculoskeletal: Degenerative changes are seen within the a lumbar spine. Osseous structures are age-appropriate. No lytic or blastic bone lesions are seen. Review of the MIP images confirms the above findings. IMPRESSION: No pulmonary embolism. Morphologic changes of pulmonary arterial hypertension, elevated right heart pressures, and at least some degree of right heart failure. Stable 4.5 cm thoracic aortic aneurysm. Ascending thoracic aortic aneurysm. Recommend semi-annual imaging followup by CTA or MRA and referral to cardiothoracic surgery if not already obtained. This recommendation follows 2010 ACCF/AHA/AATS/ACR/ASA/SCA/SCAI/SIR/STS/SVM Guidelines for the Diagnosis and Management of Patients With Thoracic Aortic Disease. Circulation. 2010; 121: D532-D924. Aortic aneurysm NOS (ICD10-I71.9) Patchy ground-glass pulmonary infiltrate within the left upper lobe,  likely infectious or inflammatory. Acute cholecystitis. Stable infrarenal abdominal aortic aneurysm, status post endovascular repair, at 4.5 cm. 2.3 cm enhancing urothelial carcinoma within the right posterior bladder lumen in the region of the right ureterovesicular junction. No definite evidence of transmural extension or regional lymphadenopathy. Aortic aneurysm NOS (ICD10-I71.9). Aortic Atherosclerosis (ICD10-I70.0). Electronically Signed   By: [name] MD   On: 09/08/2020 03:09   CT ABDOMEN PELVIS W CONTRAST  Result Date: 09/08/2020 CLINICAL DATA:  Fever, chills, leukocytosis, thoracoabdominal aortic aneurysm, respiratory failure EXAM: CT ANGIOGRAPHY CHEST CT ABDOMEN AND PELVIS WITH CONTRAST TECHNIQUE: Multidetector CT imaging of the chest was performed using the standard protocol during bolus administration of intravenous contrast. Multiplanar CT image reconstructions and MIPs were obtained to evaluate the vascular anatomy. Multidetector CT imaging of the abdomen and pelvis was performed using the standard protocol during bolus administration of intravenous contrast. CONTRAST:  137mL OMNIPAQUE IOHEXOL 350 MG/ML SOLN COMPARISON:  CTA chest 11/22/2019, CT abdomen pelvis 07/14/2020 the TI is FINDINGS: CTA CHEST FINDINGS Cardiovascular: There is excellent opacification of the pulmonary arterial tree. No intraluminal filling defect identified to suggest acute pulmonary embolism. Central pulmonary arteries are enlarged in keeping with changes of pulmonary arterial hypertension, unchanged from prior examination. Coronary artery bypass grafting has been performed. There is mild global cardiomegaly with particular enlargement of the right atrium and right ventricle. There is reflux of contrast into the hepatic caval junction in keeping with at least some degree of right heart failure. Extensive calcification of the a aortic valve leaflets. No pericardial effusion. The ascending aorta is dilated measuring 4.5  cm in greatest dimension, stable since prior examination. The aortic arch measures 3.1 cm in greatest dimension. The descending thoracic aorta at the level of the left atrium measures 2.8 cm in greatest dimension. Mediastinum/Nodes: No pathologic thoracic adenopathy. Esophagus unremarkable. Lungs/Pleura: Imaging is slightly limited by motion artifact. There has developed progressive subpleural reticulation throughout the lungs likely representing mild fibrotic change. There is, however, more focal superimposed ground-glass pulmonary infiltrate within the left upper lobe, likely infectious or inflammatory in the acute setting. No pneumothorax or pleural effusion.  Central airways are widely patent. Musculoskeletal: The osseous structures are age-appropriate. No acute bone abnormality peer Review of the MIP images confirms the above findings. CT ABDOMEN and PELVIS FINDINGS Hepatobiliary: The gallbladder is distended, there is marked gallbladder wall thickening, and extensive pericholecystic inflammatory stranding in keeping with changes of acute cholecystitis. Focal intrahepatic biliary ductal dilation within the inferior right hepatic lobe and lateral segment of the left hepatic lobe is unchanged from prior examination, possibly the sequela of remote trauma or inflammation. The liver is otherwise unremarkable. No extrahepatic biliary ductal dilation peer Pancreas: Unremarkable Spleen: Unremarkable Adrenals/Urinary Tract: The adrenal glands are unremarkable. The kidneys are normal. A 2.3 cm enhancing mural mass is again identified within the right posterior basal wall of the bladder compatible with a a urothelial carcinoma this appears similar to prior examination. The mass does not clearly demonstrate transmural extension at this time. The mass is in close proximity to the right ureterovesicular junction. The bladder is otherwise unremarkable. Stomach/Bowel: Extensive descending and sigmoid colonic diverticulosis. The  sigmoid colon is redundant. The stomach, small bowel, and large bowel are otherwise unremarkable. The appendix is not clearly identified, however, there are no secondary signs of appendicitis within the pericecal region. No free intraperitoneal gas or fluid. Vascular/Lymphatic: Infrarenal abdominal aortic aneurysm has undergone endovascular repair. The maximal diameter of the a aneurysm sac is stable at 4.5 cm. Moderate atherosclerotic calcifications seen within the lower extremity arterial outflow. The abdominal vasculature is otherwise unremarkable. No pathologic adenopathy within the abdomen and pelvis. Reproductive: Prostate is unremarkable. Other: Rectum unremarkable Musculoskeletal: Degenerative changes are seen within the a lumbar spine. Osseous structures are age-appropriate. No lytic or blastic bone lesions are seen. Review of the MIP images confirms the above findings. IMPRESSION: No pulmonary embolism. Morphologic changes of pulmonary arterial hypertension, elevated right heart pressures, and at least some degree of right heart failure. Stable 4.5 cm thoracic aortic aneurysm. Ascending thoracic aortic aneurysm. Recommend semi-annual imaging followup by CTA or MRA and referral to cardiothoracic surgery if not already obtained. This recommendation follows 2010 ACCF/AHA/AATS/ACR/ASA/SCA/SCAI/SIR/STS/SVM Guidelines for the Diagnosis and Management of Patients With Thoracic Aortic Disease. Circulation. 2010; 121: C144-Y185. Aortic aneurysm NOS (ICD10-I71.9) Patchy ground-glass pulmonary infiltrate within the left upper lobe, likely infectious or inflammatory. Acute cholecystitis. Stable infrarenal abdominal aortic aneurysm, status post endovascular repair, at 4.5 cm. 2.3 cm enhancing urothelial carcinoma within the right posterior bladder lumen in the region of the right ureterovesicular junction. No definite evidence of transmural extension or regional lymphadenopathy. Aortic aneurysm NOS (ICD10-I71.9).  Aortic Atherosclerosis (ICD10-I70.0). Electronically Signed   By: [name] MD   On: 09/08/2020 03:09   DG Chest Portable 1 View  Result Date: 09/08/2020 CLINICAL DATA:  Initial evaluation for acute shortness of breath. EXAM: PORTABLE CHEST 1 VIEW COMPARISON:  Prior radiograph from 12/21/2019. FINDINGS: Median sternotomy wires underlying CABG markers noted. Mild cardiomegaly, stable. Mediastinal silhouette within normal limits. Lungs are hypoinflated with probable underlying interstitial lung disease and/or fibrotic changes. No focal infiltrates or consolidative opacity. Mild perihilar vascular congestion without overt pulmonary edema. No pleural effusion. No pneumothorax. No acute osseous finding. IMPRESSION: 1. Shallow lung inflation with probable underlying interstitial lung disease and/or fibrotic changes. 2. Mild perihilar vascular congestion without overt pulmonary edema. 3. No other active cardiopulmonary disease. Electronically Signed   By: Jeannine Boga M.D.   On: 09/08/2020 01:09

## 2020-09-08 NOTE — Procedures (Signed)
  Procedure: Percutaneous cholycystostomy catheter placement 54f EBL:   minimal Complications:  none immediate  See full dictation in [facility].  Dillard Cannon MD Main # [phone] Pager  [phone] Mobile [phone]

## 2020-09-08 NOTE — Progress Notes (Signed)
MEDICATION-RELATED CONSULT NOTE   IR Procedure Consult - Anticoagulant/Antiplatelet PTA/Inpatient Med List Review by Pharmacist    Procedure: Percutaneous cholycystostomy catheter placement    Completed: 16:34  Post-Procedural bleeding risk per IR MD assessment:  standard  Antithrombotic medications on inpatient or PTA profile prior to procedure:   lovenox 30mg  SQ q24h (dose held today for procedure)    Recommended restart time per IR Post-Procedure Guidelines:   Day + 1 (next AM)    Other considerations:   Consider increasing dose to 40mg  since CrCl > 30 ml/min   Plan:    Begin Lovenox 30mg  SQ q24h tomorrow 11/19 at [place], PharmD, [place]: [phone] 09/08/2020 4:39 PM

## 2020-09-08 NOTE — Progress Notes (Addendum)
NAME:  Vincent Robinson, MRN:  [id number], DOB:  Feb 21, 1923, LOS: 0 ADMISSION DATE:  09/07/2020, CONSULTATION DATE:  09/08/20 REFERRING MD:  Dr April Pulumbo, CHIEF COMPLAINT:  Septic shock from cholecystitis  Brief History   86 y/o M admitted 11/17 with abdominal pain.  Work up concerning for septic shock secondary to acute cholecystitis, acute hypoxic resp failure.   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, seen in 03/2020  Significant Hospital Events   11/18 Admit with abd pain, concern for acute cholecystitis   Consults:    Procedures:    Significant Diagnostic Tests:  11/18 CT ABD/Pelvis w Contrast >> acute cholecystitis, 2.3 CM enhancing urothelial carcinoma within the right posterior bladder lumen in the region of the right ureterovesicular junction  11/18 CTA Chest 11/8 >> negative for PE, morphologic changes of pulmonary arterial hypertension, elevated right heart pressures, some degree of right heart failure, stable 4.5 cm thoracic aortic aneurysm, patchy ground-glass infiltrate in LUL  Micro Data:  COVID 11/18 >> negative  Influenza 11/18 >> negative BCx2 11/17 >>  U. Strep Antigen 11/18 >> negative  Legionella 11/18 >>  UC 11/18 >>   Antimicrobials:  Vanco 11/17 >>  Cefepime 11/18 >>  Zosyn 11/18 >>   Interim history/subjective:  Pt denies acute pain.  Reports he feels a little better.   Objective   Blood pressure (!) 95/52, pulse (!) 52, temperature 97.6 F (36.4 C), temperature source Oral, resp. rate 15, height 5\' 7"  (1.702 m), weight 70.3 kg, SpO2 93 %.        Intake/Output Summary (Last 24 hours) at 09/08/2020 1015 Last data filed at 09/08/2020 1013 Gross per 24 hour  Intake 271.29 ml  Output --   Net 271.29 ml   Filed Weights   09/08/20 0731  Weight: 70.3 kg    Examination: General: pleasant elderly adult male lying in bed in NAD HEENT: MM pink/moist, [place] O2, anicteric Neuro: AAOx4, speech clear, MAE CV: s1s2 RRR, no m/r/g PULM: non-labored on [place] O2, lungs bilaterally clear  GI: soft, bsx4 active  Extremities: warm/dry, no edema  Skin: no rashes or lesions   Resolved Hospital Problem list     Assessment & Plan:   Septic Shock in setting of suspected Acute Cholecystitis  On midodrine at home -levophed for MAP >65  -resume home midodrine TID  -LR bolus x1 now, judicious fluid  -monitor hemodynamics in ICU  -appreciate CCS / IR assistance with patient care  -follow cultures  Severe Pulmonary HTN  Emphysema  Post Inflammatory ILD  Acute on Chronic Hypoxic Respiratory Failure  OSA  -hold home macitentan, sildenafil, torsemide, spironolactone  -O2 to support sats >88% -continue bronchodilators -continue CPAP QHS   D-CHF and Cor Pulmonale (sees Dr Jeffie Pollock) -follow troponin  -await follow up ECHO  -hold home diuretic regimen   Hypokalemia  -monitor, replace as indicated    Suspected Urothelial Cancer (noted on CT in June 2021) -will need Urology consult prior to discharge   Hypothyroidism  -continue home synthroid  -follow up TSH    Best practice:  Diet: NPO but meds Pain/Anxiety/Delirium protocol (if indicated): n/a VAP protocol (if indicated): n/a DVT prophylaxis: heparin GI prophylaxis: PPI Glucose control: monitor glucose  Mobility: bed rest Code Status: full code   Family Communication:  Disposition: Daughter [name] [phone] updated per Dr. Chase Caller.   CC Time: n/a    Noe Gens, MSN, NP-C, AGACNP-BC [facility] 09/08/2020, 10:17 AM   Please see [email or web address] for pager details.       LABS    PULMONARY No results for input(s): PHART, PCO2ART, PO2ART, HCO3, TCO2, O2SAT in the last 168  hours.  Invalid input(s): PCO2, PO2  CBC Recent Labs  Lab 09/07/20 2320 09/08/20 0740  HGB 13.4 12.1*  HCT 40.1 37.3*  WBC 11.1* 12.6*  PLT 245 232    COAGULATION Recent Labs  Lab 09/07/20 2355 09/08/20 0740  INR 1.1 1.3*    CARDIAC  No results for input(s): TROPONINI in the last 168 hours. No results for input(s): PROBNP in the last 168 hours.   CHEMISTRY Recent Labs  Lab 09/07/20 2320 09/08/20 0011 09/08/20 0740  NA 137  --  136  K 3.3*  --  3.6  CL 102  --  102  CO2 24  --  24  GLUCOSE 113*  --  129*  BUN 26*  --  25*  CREATININE 1.14 1.20 1.19  CALCIUM 8.4*  --  7.9*  MG  --   --  2.0  PHOS  --   --  4.2   Estimated Creatinine Clearance: 34.7 mL/min (by C-G formula based on SCr of 1.19 mg/dL).   LIVER Recent Labs  Lab 09/07/20 2320 09/07/20 2355 09/08/20 0740  AST 608*  --  439*  ALT 297*  --  278*  ALKPHOS 227*  --  184*  BILITOT 2.7*  --  3.7*  PROT 7.8  --  6.9  ALBUMIN 3.8  --  3.2*  INR  --  1.1 1.3*    INFECTIOUS Recent Labs  Lab 09/07/20 2355 09/08/20 0740  LATICACIDVEN 1.5 1.3  PROCALCITON  --  6.79    ENDOCRINE CBG (last 3)  Recent Labs    09/08/20 0754  GLUCAP 119*    IMAGING   CT Angio Chest PE W and/or Wo Contrast  Result Date: 09/08/2020 CLINICAL DATA:  Fever, chills, leukocytosis, thoracoabdominal aortic aneurysm, respiratory failure EXAM: CT ANGIOGRAPHY CHEST CT ABDOMEN AND PELVIS WITH CONTRAST TECHNIQUE: Multidetector CT imaging of the chest was performed using the standard protocol during bolus administration of intravenous contrast. Multiplanar CT image reconstructions and MIPs were obtained to evaluate the vascular anatomy. Multidetector CT imaging of the abdomen and pelvis was performed using the standard protocol during bolus administration of intravenous contrast. CONTRAST:  132mL OMNIPAQUE IOHEXOL 350 MG/ML SOLN COMPARISON:  CTA chest 11/22/2019, CT abdomen pelvis 07/14/2020 the TI is FINDINGS: CTA CHEST  FINDINGS Cardiovascular: There is excellent opacification of the pulmonary arterial tree. No intraluminal filling defect identified to suggest acute pulmonary embolism. Central pulmonary arteries are enlarged in keeping with changes of pulmonary arterial hypertension, unchanged from prior examination. Coronary artery bypass grafting has been performed. There is mild global cardiomegaly with particular enlargement of the right atrium and right ventricle. There is reflux of contrast into the hepatic caval junction in keeping with at least some degree of right heart failure. Extensive calcification of the a aortic valve leaflets. No pericardial effusion. The ascending aorta is dilated measuring 4.5 cm in greatest dimension, stable since prior examination. The aortic arch measures 3.1 cm in greatest dimension. The descending thoracic aorta at the level of the left atrium measures 2.8 cm in greatest dimension.  Mediastinum/Nodes: No pathologic thoracic adenopathy. Esophagus unremarkable. Lungs/Pleura: Imaging is slightly limited by motion artifact. There has developed progressive subpleural reticulation throughout the lungs likely representing mild fibrotic change. There is, however, more focal superimposed ground-glass pulmonary infiltrate within the left upper lobe, likely infectious or inflammatory in the acute setting. No pneumothorax or pleural effusion. Central airways are widely patent. Musculoskeletal: The osseous structures are age-appropriate. No acute bone abnormality peer Review of the MIP images confirms the above findings. CT ABDOMEN and PELVIS FINDINGS Hepatobiliary: The gallbladder is distended, there is marked gallbladder wall thickening, and extensive pericholecystic inflammatory stranding in keeping with changes of acute cholecystitis. Focal intrahepatic biliary ductal dilation within the inferior right hepatic lobe and lateral segment of the left hepatic lobe is unchanged from prior examination,  possibly the sequela of remote trauma or inflammation. The liver is otherwise unremarkable. No extrahepatic biliary ductal dilation peer Pancreas: Unremarkable Spleen: Unremarkable Adrenals/Urinary Tract: The adrenal glands are unremarkable. The kidneys are normal. A 2.3 cm enhancing mural mass is again identified within the right posterior basal wall of the bladder compatible with a a urothelial carcinoma this appears similar to prior examination. The mass does not clearly demonstrate transmural extension at this time. The mass is in close proximity to the right ureterovesicular junction. The bladder is otherwise unremarkable. Stomach/Bowel: Extensive descending and sigmoid colonic diverticulosis. The sigmoid colon is redundant. The stomach, small bowel, and large bowel are otherwise unremarkable. The appendix is not clearly identified, however, there are no secondary signs of appendicitis within the pericecal region. No free intraperitoneal gas or fluid. Vascular/Lymphatic: Infrarenal abdominal aortic aneurysm has undergone endovascular repair. The maximal diameter of the a aneurysm sac is stable at 4.5 cm. Moderate atherosclerotic calcifications seen within the lower extremity arterial outflow. The abdominal vasculature is otherwise unremarkable. No pathologic adenopathy within the abdomen and pelvis. Reproductive: Prostate is unremarkable. Other: Rectum unremarkable Musculoskeletal: Degenerative changes are seen within the a lumbar spine. Osseous structures are age-appropriate. No lytic or blastic bone lesions are seen. Review of the MIP images confirms the above findings. IMPRESSION: No pulmonary embolism. Morphologic changes of pulmonary arterial hypertension, elevated right heart pressures, and at least some degree of right heart failure. Stable 4.5 cm thoracic aortic aneurysm. Ascending thoracic aortic aneurysm. Recommend semi-annual imaging followup by CTA or MRA and referral to cardiothoracic surgery if  not already obtained. This recommendation follows 2010 ACCF/AHA/AATS/ACR/ASA/SCA/SCAI/SIR/STS/SVM Guidelines for the Diagnosis and Management of Patients With Thoracic Aortic Disease. Circulation. 2010; 121: H419-F790. Aortic aneurysm NOS (ICD10-I71.9) Patchy ground-glass pulmonary infiltrate within the left upper lobe, likely infectious or inflammatory. Acute cholecystitis. Stable infrarenal abdominal aortic aneurysm, status post endovascular repair, at 4.5 cm. 2.3 cm enhancing urothelial carcinoma within the right posterior bladder lumen in the region of the right ureterovesicular junction. No definite evidence of transmural extension or regional lymphadenopathy. Aortic aneurysm NOS (ICD10-I71.9). Aortic Atherosclerosis (ICD10-I70.0). Electronically Signed   By: [name] MD   On: 09/08/2020 03:09   CT ABDOMEN PELVIS W CONTRAST  Result Date: 09/08/2020 CLINICAL DATA:  Fever, chills, leukocytosis, thoracoabdominal aortic aneurysm, respiratory failure EXAM: CT ANGIOGRAPHY CHEST CT ABDOMEN AND PELVIS WITH CONTRAST TECHNIQUE: Multidetector CT imaging of the chest was performed using the standard protocol during bolus administration of intravenous contrast. Multiplanar CT image reconstructions and MIPs were obtained to evaluate the vascular anatomy. Multidetector CT imaging of the abdomen and pelvis was performed using the standard protocol during bolus administration of intravenous contrast. CONTRAST:  127mL OMNIPAQUE IOHEXOL 350 MG/ML SOLN COMPARISON:  CTA chest 11/22/2019, CT abdomen pelvis 07/14/2020 the TI is FINDINGS: CTA CHEST FINDINGS Cardiovascular: There is excellent opacification of the pulmonary arterial tree. No intraluminal filling defect identified to suggest acute pulmonary embolism. Central pulmonary arteries are enlarged in keeping with changes of pulmonary arterial hypertension, unchanged from prior examination. Coronary artery bypass grafting has been performed. There is mild global  cardiomegaly with particular enlargement of the right atrium and right ventricle. There is reflux of contrast into the hepatic caval junction in keeping with at least some degree of right heart failure. Extensive calcification of the a aortic valve leaflets. No pericardial effusion. The ascending aorta is dilated measuring 4.5 cm in greatest dimension, stable since prior examination. The aortic arch measures 3.1 cm in greatest dimension. The descending thoracic aorta at the level of the left atrium measures 2.8 cm in greatest dimension. Mediastinum/Nodes: No pathologic thoracic adenopathy. Esophagus unremarkable. Lungs/Pleura: Imaging is slightly limited by motion artifact. There has developed progressive subpleural reticulation throughout the lungs likely representing mild fibrotic change. There is, however, more focal superimposed ground-glass pulmonary infiltrate within the left upper lobe, likely infectious or inflammatory in the acute setting. No pneumothorax or pleural effusion. Central airways are widely patent. Musculoskeletal: The osseous structures are age-appropriate. No acute bone abnormality peer Review of the MIP images confirms the above findings. CT ABDOMEN and PELVIS FINDINGS Hepatobiliary: The gallbladder is distended, there is marked gallbladder wall thickening, and extensive pericholecystic inflammatory stranding in keeping with changes of acute cholecystitis. Focal intrahepatic biliary ductal dilation within the inferior right hepatic lobe and lateral segment of the left hepatic lobe is unchanged from prior examination, possibly the sequela of remote trauma or inflammation. The liver is otherwise unremarkable. No extrahepatic biliary ductal dilation peer Pancreas: Unremarkable Spleen: Unremarkable Adrenals/Urinary Tract: The adrenal glands are unremarkable. The kidneys are normal. A 2.3 cm enhancing mural mass is again identified within the right posterior basal wall of the bladder compatible  with a a urothelial carcinoma this appears similar to prior examination. The mass does not clearly demonstrate transmural extension at this time. The mass is in close proximity to the right ureterovesicular junction. The bladder is otherwise unremarkable. Stomach/Bowel: Extensive descending and sigmoid colonic diverticulosis. The sigmoid colon is redundant. The stomach, small bowel, and large bowel are otherwise unremarkable. The appendix is not clearly identified, however, there are no secondary signs of appendicitis within the pericecal region. No free intraperitoneal gas or fluid. Vascular/Lymphatic: Infrarenal abdominal aortic aneurysm has undergone endovascular repair. The maximal diameter of the a aneurysm sac is stable at 4.5 cm. Moderate atherosclerotic calcifications seen within the lower extremity arterial outflow. The abdominal vasculature is otherwise unremarkable. No pathologic adenopathy within the abdomen and pelvis. Reproductive: Prostate is unremarkable. Other: Rectum unremarkable Musculoskeletal: Degenerative changes are seen within the a lumbar spine. Osseous structures are age-appropriate. No lytic or blastic bone lesions are seen. Review of the MIP images confirms the above findings. IMPRESSION: No pulmonary embolism. Morphologic changes of pulmonary arterial hypertension, elevated right heart pressures, and at least some degree of right heart failure. Stable 4.5 cm thoracic aortic aneurysm. Ascending thoracic aortic aneurysm. Recommend semi-annual imaging followup by CTA or MRA and referral to cardiothoracic surgery if not already obtained. This recommendation follows 2010 ACCF/AHA/AATS/ACR/ASA/SCA/SCAI/SIR/STS/SVM Guidelines for the Diagnosis and Management of Patients With Thoracic Aortic Disease. Circulation. 2010; 121: U045-W098. Aortic aneurysm NOS (ICD10-I71.9) Patchy ground-glass pulmonary infiltrate within the left upper lobe, likely infectious or inflammatory. Acute cholecystitis.  Stable infrarenal abdominal aortic aneurysm, status post  endovascular repair, at 4.5 cm. 2.3 cm enhancing urothelial carcinoma within the right posterior bladder lumen in the region of the right ureterovesicular junction. No definite evidence of transmural extension or regional lymphadenopathy. Aortic aneurysm NOS (ICD10-I71.9). Aortic Atherosclerosis (ICD10-I70.0). Electronically Signed   By: [name] MD   On: 09/08/2020 03:09   DG Chest Portable 1 View  Result Date: 09/08/2020 CLINICAL DATA:  Initial evaluation for acute shortness of breath. EXAM: PORTABLE CHEST 1 VIEW COMPARISON:  Prior radiograph from 12/21/2019. FINDINGS: Median sternotomy wires underlying CABG markers noted. Mild cardiomegaly, stable. Mediastinal silhouette within normal limits. Lungs are hypoinflated with probable underlying interstitial lung disease and/or fibrotic changes. No focal infiltrates or consolidative opacity. Mild perihilar vascular congestion without overt pulmonary edema. No pleural effusion. No pneumothorax. No acute osseous finding. IMPRESSION: 1. Shallow lung inflation with probable underlying interstitial lung disease and/or fibrotic changes. 2. Mild perihilar vascular congestion without overt pulmonary edema. 3. No other active cardiopulmonary disease. Electronically Signed   By: Jeannine Boga M.D.   On: 09/08/2020 01:09

## 2020-09-08 NOTE — Progress Notes (Signed)
Pt wants to hold off on bipap for now. He is 94% on 15L [place] high flow in no distress. Will notify nurse to call if pt gets into distress or o2 drops.Bipap on standby in room.

## 2020-09-08 NOTE — Progress Notes (Signed)
[facility] Progress Note Patient Name: Vincent Robinson DOB: 1923-01-14 MRN: [id number]   Date of Service  09/08/2020  HPI/Events of Note  Patient c/o nausea, QTc  0.42.  eICU Interventions  PRN Zofran ordered.        Kerry Kass [name] 09/08/2020, 7:51 PM

## 2020-09-08 NOTE — Progress Notes (Signed)
  Echocardiogram 2D Echocardiogram with definity has been performed.  [name] M 09/08/2020, 1:38 PM

## 2020-09-08 NOTE — Progress Notes (Signed)
Peripherally Inserted Central Catheter Placement  The IV Nurse has discussed with the patient and/or persons authorized to consent for the patient, the purpose of this procedure and the potential benefits and risks involved with this procedure.  The benefits include less needle sticks, lab draws from the catheter, and the patient may be discharged home with the catheter. Risks include, but not limited to, infection, bleeding, blood clot (thrombus formation), and puncture of an artery; nerve damage and irregular heartbeat and possibility to perform a PICC exchange if needed/ordered by physician.  Alternatives to this procedure were also discussed.  B[facility] Power PICC patient education guide, fact sheet on infection prevention and patient information card has been provided to patient /or left at bedside.    PICC Placement Documentation  PICC Double Lumen 09/08/20 PICC Right Brachial 37 cm 0 cm (Active)  Exposed Catheter (cm) 0 cm 09/08/20 1445  Site Assessment Clean;Dry;Intact 09/08/20 1445  Lumen #1 Status Flushed;Saline locked;Blood return noted 09/08/20 1445  Lumen #2 Status Flushed;Saline locked;Blood return noted 09/08/20 1445  Dressing Type Transparent;Securing device 09/08/20 1445  Dressing Status Clean;Dry;Intact 09/08/20 1445  Antimicrobial disc in place? Yes 09/08/20 1445  Safety Lock Not Applicable 92/92/44 6286  Dressing Change Due 09/15/20 09/08/20 [place] 09/08/2020, 3:39 PM

## 2020-09-08 NOTE — ED Notes (Signed)
Critical care MD made aware that pt is maxed out on Levophed. BP is stable at 127/98 at this time.

## 2020-09-08 NOTE — Progress Notes (Signed)
IR consulted by Saverio Danker, PA-C for possible image-guided percutaneous cholecystostomy tube placement.  Case/images have been reviewed by Dr. Vernard Gambles who states gallbladder does not appear distended on CT and recommends HIDA prior to approving procedure. Claiborne Billings, PA-C made aware. Will hold today's dose of Lovenox in anticipation for possible procedure following HIDA. Please keep patient NPO today as well.  IR to follow.   Bea Graff Keiasia Christianson, PA-C 09/08/2020, 8:16 AM

## 2020-09-08 NOTE — ED Notes (Signed)
Report given to [facility], RN in ICU.

## 2020-09-08 NOTE — ED Notes (Signed)
Respiratory on the way down to administer breathing treatment.

## 2020-09-08 NOTE — Consult Note (Signed)
Vincent Robinson [birth year]  [id number].    Requesting MD: Dr. Brand Males Chief Complaint/Reason for Consult: cholecystitis   HPI:  This is a pleasant 86 yo white male with a history of severe pulmonary HTN with cor pulmonale with significant RHF, a fib not on AC due to recent GI bleed, AAA with stent, CAD, s/p CABG x5, ILD requiring 2L of [place] at all times, and bladder cancer (not able to remove secondary to cardiac issues) who lives at home by himself as his wife is in SNF being treated for lung cancer.  Yesterday, he began having some upper abdominal pain.  He developed nausea and dry heaves but was unable to vomit.  He denies acute diarrhea, but says he has been having some diarrhea for "awhile now".  He states he got really cold and his hands started to shake.  He did not check his temperature.  He denies anything helping his pain or making it worse.  He call EMS who brought him to the ED where he was found to have a temp of 101.7 with hypotension meeting criteria for sepsis.  He is noted to have a slightly elevated WBC at 11K, but with elevated LFTs including TB at 2.7.  He has CT imaging c/w cholecystitis.  He is being admitted to [facility] for management of his pressor needs and sepsis.  We have been asked to evaluate his for his cholecystitis.  He states his pain has currently resolved and he feels better.  ROS: ROS: Please see HPI, otherwise all other systems have been reviewed and are currently negative, except he wears compressions stockings secondary to fluid build up in his LEs  Family History  Problem Relation Age of Onset  . Emphysema Mother   . Stroke Father   . Movement disorder Sister   . Aneurysm Brother        Thoracic aorta  . Macular degeneration Brother     Past Medical History:  Diagnosis Date  . AAA (abdominal aortic aneurysm) ([place])   . Acute respiratory failure ([place]) 11/23/2019  . Asthma   . Basal cell carcinoma   . Chronic respiratory failure ([place])   . COPD  (chronic obstructive pulmonary disease) ([place])   . Coronary artery disease   . History of chronic respiratory failure   . Hyperlipidemia   . Hypothyroidism   . Right heart failure ([place])   . Skin cancer 2017  . Sleep apnea     Past Surgical History:  Procedure Laterality Date  . bleeding ulcer  2016  . BYPASS AXILLA/BRACHIAL ARTERY  1998  . colon bleed  2012  . COLONOSCOPY N/A 06/07/2017   Procedure: COLONOSCOPY;  Surgeon: Carol Ada, MD;  Location: [facility];  Service: Endoscopy;  Laterality: N/A;  . DEBRIDMENT OF DECUBITUS ULCER  2002  . IR ANGIOGRAM SELECTIVE EACH ADDITIONAL VESSEL  03/22/2020  . IR ANGIOGRAM VISCERAL SELECTIVE  03/22/2020  . IR US GUIDE VASC ACCESS RIGHT  03/22/2020  . RIGHT HEART CATH N/A 12/11/2017   Procedure: RIGHT HEART CATH;  Surgeon: Jolaine Artist, MD;  Location: [facility];  Service: Cardiovascular;  Laterality: N/A;  . THORACIC AORTA STENT  2009    Social History:  reports that he quit smoking about 59 years ago. He smoked 0.50 packs per day. He has never used smokeless tobacco. He reports previous alcohol use. He reports that he does not use drugs.  Allergies: No Known Allergies  (Not in a hospital  admission)    Physical Exam: Blood pressure (!) 97/54, pulse (!) 50, temperature 98 F (36.7 C), temperature source Oral, resp. rate 13, height 5\' 7"  (1.702 m), weight 70.3 kg, SpO2 90 %. General: pleasant, WD, WN white male who is laying in bed in NAD HEENT: head is normocephalic, atraumatic.  Sclera are noninjected.  PERRL.  Ears and nose without any masses or lesions.  Mouth is pink and moist, dentition is poor Heart: irregularly irregular, brady in high 40s-low 50s.  Normal s1,s2. No obvious gallops, or rubs noted.  +loud murmur. Palpable radial and pedal pulses bilaterally Lungs: CTAB, no wheezes, rhonchi, or rales noted.  Respiratory effort nonlabored, but on 15L HFNC with humidifier Abd: soft, NT, ND, +BS, no masses, hernias, or  organomegaly MS: all 4 extremities are symmetrical with no cyanosis, clubbing.  Some edema in his BLE with compression socks in place. Skin: warm and dry with no masses, lesions, or rashes Neuro: Cranial nerves 2-12 grossly intact, sensation is normal throughout Psych: A&Ox3 with an appropriate affect.   Results for orders placed or performed during the hospital encounter of 09/07/20 (from the past 48 hour(s))  Troponin I (High Sensitivity)     Status: Abnormal   Collection Time: 09/07/20 11:20 PM  Result Value Ref Range   Troponin I (High Sensitivity) 31 (H) <18 ng/L    Comment: (NOTE) Elevated high sensitivity troponin I (hsTnI) values and significant  changes across serial measurements may suggest ACS but many other  chronic and acute conditions are known to elevate hsTnI results.  Refer to the "Links" section for chest pain algorithms and additional  guidance. Performed at [facility], [place] [address] [place], [place] [address]   CBC with Differential     Status: Abnormal   Collection Time: 09/07/20 11:20 PM  Result Value Ref Range   WBC 11.1 (H) 4.0 - 10.5 K/uL   RBC 4.15 (L) 4.22 - 5.81 MIL/uL   Hemoglobin 13.4 13.0 - 17.0 g/dL   HCT 40.1 39 - 52 %   MCV 96.6 80.0 - 100.0 fL   MCH 32.3 26.0 - 34.0 pg   MCHC 33.4 30.0 - 36.0 g/dL   RDW 14.8 11.5 - 15.5 %   Platelets 245 150 - 400 K/uL   nRBC 0.0 0.0 - 0.2 %   Neutrophils Relative % 92 %   Neutro Abs 10.3 (H) 1.7 - 7.7 K/uL   Lymphocytes Relative 3 %   Lymphs Abs 0.3 (L) 0.7 - 4.0 K/uL   Monocytes Relative 5 %   Monocytes Absolute 0.5 0.1 - 1.0 K/uL   Eosinophils Relative 0 %   Eosinophils Absolute 0.0 0.0 - 0.5 K/uL   Basophils Relative 0 %   Basophils Absolute 0.0 0.0 - 0.1 K/uL   Immature Granulocytes 0 %   Abs Immature Granulocytes 0.04 0.00 - 0.07 K/uL    Comment: Performed at [facility], [place] [address] [place], [place] [address]  Brain natriuretic peptide     Status:  Abnormal   Collection Time: 09/07/20 11:20 PM  Result Value Ref Range   B Natriuretic Peptide 234.2 (H) 0.0 - 100.0 pg/mL    Comment: Performed at [facility], [place] [address] [place], [place] [address]  Comprehensive metabolic panel     Status: Abnormal   Collection Time: 09/07/20 11:20 PM  Result Value Ref Range   Sodium 137 135 - 145 mmol/L   Potassium 3.3 (L) 3.5 - 5.1 mmol/L  Chloride 102 98 - 111 mmol/L   CO2 24 22 - 32 mmol/L   Glucose, Bld 113 (H) 70 - 99 mg/dL    Comment: Glucose reference range applies only to samples taken after fasting for at least 8 hours.   BUN 26 (H) 8 - 23 mg/dL   Creatinine, Ser 1.14 0.61 - 1.24 mg/dL   Calcium 8.4 (L) 8.9 - 10.3 mg/dL   Total Protein 7.8 6.5 - 8.1 g/dL   Albumin 3.8 3.5 - 5.0 g/dL   AST 608 (H) 15 - 41 U/L   ALT 297 (H) 0 - 44 U/L   Alkaline Phosphatase 227 (H) 38 - 126 U/L   Total Bilirubin 2.7 (H) 0.3 - 1.2 mg/dL   GFR, Estimated 59 (L) >60 mL/min    Comment: (NOTE) Calculated using the CKD-EPI Creatinine Equation (2021)    Anion gap 11 5 - 15    Comment: Performed at [facility], [place] [address] [place], [place] [address]  Urinalysis, Routine w reflex microscopic In/Out Cath Urine     Status: Abnormal   Collection Time: 09/07/20 11:20 PM  Result Value Ref Range   Color, Urine YELLOW YELLOW   APPearance CLEAR CLEAR   Specific Gravity, Urine 1.008 1.005 - 1.030   pH 5.0 5.0 - 8.0   Glucose, UA NEGATIVE NEGATIVE mg/dL   Hgb urine dipstick MODERATE (A) NEGATIVE   Bilirubin Urine NEGATIVE NEGATIVE   Ketones, ur NEGATIVE NEGATIVE mg/dL   Protein, ur NEGATIVE NEGATIVE mg/dL   Nitrite NEGATIVE NEGATIVE   Leukocytes,Ua NEGATIVE NEGATIVE   RBC / HPF 21-50 0 - 5 RBC/hpf   WBC, UA 0-5 0 - 5 WBC/hpf   Bacteria, UA NONE SEEN NONE SEEN   Squamous Epithelial / LPF 0-5 0 - 5   Mucus PRESENT     Comment: Performed at [facility], [place] [address], [place], [place] [address]    Lactic acid, plasma     Status: None   Collection Time: 09/07/20 11:55 PM  Result Value Ref Range   Lactic Acid, Venous 1.5 0.5 - 1.9 mmol/L    Comment: Performed at [facility], [place] [address] [place], [place] [address]  Protime-INR     Status: None   Collection Time: 09/07/20 11:55 PM  Result Value Ref Range   Prothrombin Time 14.0 11.4 - 15.2 seconds   INR 1.1 0.8 - 1.2    Comment: (NOTE) INR goal varies based on device and disease states. Performed at [facility], [place] [address], [place], [place] [address]   APTT     Status: None   Collection Time: 09/07/20 11:55 PM  Result Value Ref Range   aPTT 32 24 - 36 seconds    Comment: Performed at [facility], [place] [address], [place], [place] [address]  Blood culture (routine x 2)     Status: None (Preliminary result)   Collection Time: 09/07/20 11:55 PM   Specimen: BLOOD  Result Value Ref Range   Specimen Description      BLOOD SITE NOT SPECIFIED Performed at [facility], [place] [address] [place], [place] [address]    Special Requests      BOTTLES DRAWN AEROBIC AND ANAEROBIC Blood Culture results may not be optimal due to an excessive volume of blood received in culture bottles Performed at [place] [address], [place], [place] [address]    Culture PENDING    Report  Status PENDING   I-Stat Creatinine, ED (not at [facility])     Status: None   Collection Time: 09/08/20 12:11 AM  Result Value Ref Range   Creatinine, Ser 1.20 0.61 - 1.24 mg/dL  Troponin I (High Sensitivity)     Status: Abnormal   Collection Time: 09/08/20 12:27 AM  Result Value Ref Range   Troponin I (High Sensitivity) 20 (H) <18 ng/L    Comment: (NOTE) Elevated high sensitivity troponin I (hsTnI) values and significant  changes across serial measurements may suggest ACS but many other  chronic and acute conditions are known to elevate hsTnI results.  Refer to the "Links"  section for chest pain algorithms and additional  guidance. Performed at [facility], [place] [address] [place], [place] [address]   Respiratory Panel by RT PCR (Flu A&B, Covid) - Nasopharyngeal Swab     Status: None   Collection Time: 09/08/20  4:13 AM   Specimen: Nasopharyngeal Swab  Result Value Ref Range   SARS Coronavirus 2 by RT PCR NEGATIVE NEGATIVE    Comment: (NOTE) SARS-CoV-2 target nucleic acids are NOT DETECTED.  The SARS-CoV-2 RNA is generally detectable in upper respiratoy specimens during the acute phase of infection. The lowest concentration of SARS-CoV-2 viral copies this assay can detect is 131 copies/mL. A negative result does not preclude SARS-Cov-2 infection and should not be used as the sole basis for treatment or other patient management decisions. A negative result may occur with  improper specimen collection/handling, submission of specimen other than nasopharyngeal swab, presence of viral mutation(s) within the areas targeted by this assay, and inadequate number of viral copies (<131 copies/mL). A negative result must be combined with clinical observations, patient history, and epidemiological information. The expected result is Negative.  Fact Sheet for Patients:  PinkCheek.be  Fact Sheet for Healthcare Providers:  GravelBags.it  This test is no t yet approved or cleared by the [place] FDA and  has been authorized for detection and/or diagnosis of SARS-CoV-2 by FDA under an Emergency Use Authorization (EUA). This EUA will remain  in effect (meaning this test can be used) for the duration of the COVID-19 declaration under Section 564(b)(1) of the Act, 21 U.S.C. section 360bbb-3(b)(1), unless the authorization is terminated or revoked sooner.     Influenza A by PCR NEGATIVE NEGATIVE   Influenza B by PCR NEGATIVE NEGATIVE    Comment: (NOTE) The Xpert Xpress  SARS-CoV-2/FLU/RSV assay is intended as an aid in  the diagnosis of influenza from Nasopharyngeal swab specimens and  should not be used as a sole basis for treatment. Nasal washings and  aspirates are unacceptable for Xpert Xpress SARS-CoV-2/FLU/RSV  testing.  Fact Sheet for Patients: PinkCheek.be  Fact Sheet for Healthcare Providers: GravelBags.it  This test is not yet approved or cleared by the [place] FDA and  has been authorized for detection and/or diagnosis of SARS-CoV-2 by  FDA under an Emergency Use Authorization (EUA). This EUA will remain  in effect (meaning this test can be used) for the duration of the  Covid-19 declaration under Section 564(b)(1) of the Act, 21  U.S.C. section 360bbb-3(b)(1), unless the authorization is  terminated or revoked. Performed at [facility], [place] [address] [place], [place] [address]   CBG monitoring, ED     Status: Abnormal   Collection Time: 09/08/20  7:54 AM  Result Value Ref Range   Glucose-Capillary 119 (H) 70 - 99 mg/dL    Comment: Glucose reference range applies  only to samples taken after fasting for at least 8 hours.   CT Angio Chest PE W and/or Wo Contrast  Result Date: 09/08/2020 CLINICAL DATA:  Fever, chills, leukocytosis, thoracoabdominal aortic aneurysm, respiratory failure EXAM: CT ANGIOGRAPHY CHEST CT ABDOMEN AND PELVIS WITH CONTRAST TECHNIQUE: Multidetector CT imaging of the chest was performed using the standard protocol during bolus administration of intravenous contrast. Multiplanar CT image reconstructions and MIPs were obtained to evaluate the vascular anatomy. Multidetector CT imaging of the abdomen and pelvis was performed using the standard protocol during bolus administration of intravenous contrast. CONTRAST:  160mL OMNIPAQUE IOHEXOL 350 MG/ML SOLN COMPARISON:  CTA chest 11/22/2019, CT abdomen pelvis 07/14/2020 the TI is FINDINGS: CTA  CHEST FINDINGS Cardiovascular: There is excellent opacification of the pulmonary arterial tree. No intraluminal filling defect identified to suggest acute pulmonary embolism. Central pulmonary arteries are enlarged in keeping with changes of pulmonary arterial hypertension, unchanged from prior examination. Coronary artery bypass grafting has been performed. There is mild global cardiomegaly with particular enlargement of the right atrium and right ventricle. There is reflux of contrast into the hepatic caval junction in keeping with at least some degree of right heart failure. Extensive calcification of the a aortic valve leaflets. No pericardial effusion. The ascending aorta is dilated measuring 4.5 cm in greatest dimension, stable since prior examination. The aortic arch measures 3.1 cm in greatest dimension. The descending thoracic aorta at the level of the left atrium measures 2.8 cm in greatest dimension. Mediastinum/Nodes: No pathologic thoracic adenopathy. Esophagus unremarkable. Lungs/Pleura: Imaging is slightly limited by motion artifact. There has developed progressive subpleural reticulation throughout the lungs likely representing mild fibrotic change. There is, however, more focal superimposed ground-glass pulmonary infiltrate within the left upper lobe, likely infectious or inflammatory in the acute setting. No pneumothorax or pleural effusion. Central airways are widely patent. Musculoskeletal: The osseous structures are age-appropriate. No acute bone abnormality peer Review of the MIP images confirms the above findings. CT ABDOMEN and PELVIS FINDINGS Hepatobiliary: The gallbladder is distended, there is marked gallbladder wall thickening, and extensive pericholecystic inflammatory stranding in keeping with changes of acute cholecystitis. Focal intrahepatic biliary ductal dilation within the inferior right hepatic lobe and lateral segment of the left hepatic lobe is unchanged from prior examination,  possibly the sequela of remote trauma or inflammation. The liver is otherwise unremarkable. No extrahepatic biliary ductal dilation peer Pancreas: Unremarkable Spleen: Unremarkable Adrenals/Urinary Tract: The adrenal glands are unremarkable. The kidneys are normal. A 2.3 cm enhancing mural mass is again identified within the right posterior basal wall of the bladder compatible with a a urothelial carcinoma this appears similar to prior examination. The mass does not clearly demonstrate transmural extension at this time. The mass is in close proximity to the right ureterovesicular junction. The bladder is otherwise unremarkable. Stomach/Bowel: Extensive descending and sigmoid colonic diverticulosis. The sigmoid colon is redundant. The stomach, small bowel, and large bowel are otherwise unremarkable. The appendix is not clearly identified, however, there are no secondary signs of appendicitis within the pericecal region. No free intraperitoneal gas or fluid. Vascular/Lymphatic: Infrarenal abdominal aortic aneurysm has undergone endovascular repair. The maximal diameter of the a aneurysm sac is stable at 4.5 cm. Moderate atherosclerotic calcifications seen within the lower extremity arterial outflow. The abdominal vasculature is otherwise unremarkable. No pathologic adenopathy within the abdomen and pelvis. Reproductive: Prostate is unremarkable. Other: Rectum unremarkable Musculoskeletal: Degenerative changes are seen within the a lumbar spine. Osseous structures are age-appropriate. No lytic or blastic bone lesions are  seen. Review of the MIP images confirms the above findings. IMPRESSION: No pulmonary embolism. Morphologic changes of pulmonary arterial hypertension, elevated right heart pressures, and at least some degree of right heart failure. Stable 4.5 cm thoracic aortic aneurysm. Ascending thoracic aortic aneurysm. Recommend semi-annual imaging followup by CTA or MRA and referral to cardiothoracic surgery if  not already obtained. This recommendation follows 2010 ACCF/AHA/AATS/ACR/ASA/SCA/SCAI/SIR/STS/SVM Guidelines for the Diagnosis and Management of Patients With Thoracic Aortic Disease. Circulation. 2010; 121: M196-Q229. Aortic aneurysm NOS (ICD10-I71.9) Patchy ground-glass pulmonary infiltrate within the left upper lobe, likely infectious or inflammatory. Acute cholecystitis. Stable infrarenal abdominal aortic aneurysm, status post endovascular repair, at 4.5 cm. 2.3 cm enhancing urothelial carcinoma within the right posterior bladder lumen in the region of the right ureterovesicular junction. No definite evidence of transmural extension or regional lymphadenopathy. Aortic aneurysm NOS (ICD10-I71.9). Aortic Atherosclerosis (ICD10-I70.0). Electronically Signed   By: [name] MD   On: 09/08/2020 03:09   CT ABDOMEN PELVIS W CONTRAST  Result Date: 09/08/2020 CLINICAL DATA:  Fever, chills, leukocytosis, thoracoabdominal aortic aneurysm, respiratory failure EXAM: CT ANGIOGRAPHY CHEST CT ABDOMEN AND PELVIS WITH CONTRAST TECHNIQUE: Multidetector CT imaging of the chest was performed using the standard protocol during bolus administration of intravenous contrast. Multiplanar CT image reconstructions and MIPs were obtained to evaluate the vascular anatomy. Multidetector CT imaging of the abdomen and pelvis was performed using the standard protocol during bolus administration of intravenous contrast. CONTRAST:  155mL OMNIPAQUE IOHEXOL 350 MG/ML SOLN COMPARISON:  CTA chest 11/22/2019, CT abdomen pelvis 07/14/2020 the TI is FINDINGS: CTA CHEST FINDINGS Cardiovascular: There is excellent opacification of the pulmonary arterial tree. No intraluminal filling defect identified to suggest acute pulmonary embolism. Central pulmonary arteries are enlarged in keeping with changes of pulmonary arterial hypertension, unchanged from prior examination. Coronary artery bypass grafting has been performed. There is mild global  cardiomegaly with particular enlargement of the right atrium and right ventricle. There is reflux of contrast into the hepatic caval junction in keeping with at least some degree of right heart failure. Extensive calcification of the a aortic valve leaflets. No pericardial effusion. The ascending aorta is dilated measuring 4.5 cm in greatest dimension, stable since prior examination. The aortic arch measures 3.1 cm in greatest dimension. The descending thoracic aorta at the level of the left atrium measures 2.8 cm in greatest dimension. Mediastinum/Nodes: No pathologic thoracic adenopathy. Esophagus unremarkable. Lungs/Pleura: Imaging is slightly limited by motion artifact. There has developed progressive subpleural reticulation throughout the lungs likely representing mild fibrotic change. There is, however, more focal superimposed ground-glass pulmonary infiltrate within the left upper lobe, likely infectious or inflammatory in the acute setting. No pneumothorax or pleural effusion. Central airways are widely patent. Musculoskeletal: The osseous structures are age-appropriate. No acute bone abnormality peer Review of the MIP images confirms the above findings. CT ABDOMEN and PELVIS FINDINGS Hepatobiliary: The gallbladder is distended, there is marked gallbladder wall thickening, and extensive pericholecystic inflammatory stranding in keeping with changes of acute cholecystitis. Focal intrahepatic biliary ductal dilation within the inferior right hepatic lobe and lateral segment of the left hepatic lobe is unchanged from prior examination, possibly the sequela of remote trauma or inflammation. The liver is otherwise unremarkable. No extrahepatic biliary ductal dilation peer Pancreas: Unremarkable Spleen: Unremarkable Adrenals/Urinary Tract: The adrenal glands are unremarkable. The kidneys are normal. A 2.3 cm enhancing mural mass is again identified within the right posterior basal wall of the bladder compatible  with a a urothelial carcinoma this appears similar to prior  examination. The mass does not clearly demonstrate transmural extension at this time. The mass is in close proximity to the right ureterovesicular junction. The bladder is otherwise unremarkable. Stomach/Bowel: Extensive descending and sigmoid colonic diverticulosis. The sigmoid colon is redundant. The stomach, small bowel, and large bowel are otherwise unremarkable. The appendix is not clearly identified, however, there are no secondary signs of appendicitis within the pericecal region. No free intraperitoneal gas or fluid. Vascular/Lymphatic: Infrarenal abdominal aortic aneurysm has undergone endovascular repair. The maximal diameter of the a aneurysm sac is stable at 4.5 cm. Moderate atherosclerotic calcifications seen within the lower extremity arterial outflow. The abdominal vasculature is otherwise unremarkable. No pathologic adenopathy within the abdomen and pelvis. Reproductive: Prostate is unremarkable. Other: Rectum unremarkable Musculoskeletal: Degenerative changes are seen within the a lumbar spine. Osseous structures are age-appropriate. No lytic or blastic bone lesions are seen. Review of the MIP images confirms the above findings. IMPRESSION: No pulmonary embolism. Morphologic changes of pulmonary arterial hypertension, elevated right heart pressures, and at least some degree of right heart failure. Stable 4.5 cm thoracic aortic aneurysm. Ascending thoracic aortic aneurysm. Recommend semi-annual imaging followup by CTA or MRA and referral to cardiothoracic surgery if not already obtained. This recommendation follows 2010 ACCF/AHA/AATS/ACR/ASA/SCA/SCAI/SIR/STS/SVM Guidelines for the Diagnosis and Management of Patients With Thoracic Aortic Disease. Circulation. 2010; 121: O130-Q657. Aortic aneurysm NOS (ICD10-I71.9) Patchy ground-glass pulmonary infiltrate within the left upper lobe, likely infectious or inflammatory. Acute cholecystitis.  Stable infrarenal abdominal aortic aneurysm, status post endovascular repair, at 4.5 cm. 2.3 cm enhancing urothelial carcinoma within the right posterior bladder lumen in the region of the right ureterovesicular junction. No definite evidence of transmural extension or regional lymphadenopathy. Aortic aneurysm NOS (ICD10-I71.9). Aortic Atherosclerosis (ICD10-I70.0). Electronically Signed   By: [name] MD   On: 09/08/2020 03:09   DG Chest Portable 1 View  Result Date: 09/08/2020 CLINICAL DATA:  Initial evaluation for acute shortness of breath. EXAM: PORTABLE CHEST 1 VIEW COMPARISON:  Prior radiograph from 12/21/2019. FINDINGS: Median sternotomy wires underlying CABG markers noted. Mild cardiomegaly, stable. Mediastinal silhouette within normal limits. Lungs are hypoinflated with probable underlying interstitial lung disease and/or fibrotic changes. No focal infiltrates or consolidative opacity. Mild perihilar vascular congestion without overt pulmonary edema. No pleural effusion. No pneumothorax. No acute osseous finding. IMPRESSION: 1. Shallow lung inflation with probable underlying interstitial lung disease and/or fibrotic changes. 2. Mild perihilar vascular congestion without overt pulmonary edema. 3. No other active cardiopulmonary disease. Electronically Signed   By: Jeannine Boga M.D.   On: 09/08/2020 01:09      Assessment/Plan Right-sided HF Pulmonary HTN with cor pulmonale CAD, s/p CABG x5 A Fib, not on AC Bladder cancer ILD on O2 at home  Sepsis secondary to acute cholecystitis The patient has been seen and examined along with review of his labs and imaging.  He certainly appears to have cholecystitis on CT scan.  He currently has no abdominal pain, whether from pain medications or initiation of abx therapy.  However, given the severity of his illness, we recommend proceeding with a percutaneous cholecystostomy tube placement as he is very high risk for surgical intervention.   The patient himself states that Dr. Haroldine Laws told him he could not have surgery for his bladder cancer as he was too high risk.  The patient and I discussed this drain and that it stays for at least 6-8 weeks generally; however, in some cases it may be needed life-long.  We discussed that that determination would be made  after IR evaluation of the drain as an outpatient and follow up assessment in our office in those 6-8 weeks.  He understands and is agreeable to proceed.  Agree with abx therapy, NPO for now for IR procedure.  We will continue to follow along with you.   FEN - NPO/IVFs/levo VTE - on hold for procedure ID - Maxipime/ 1 dose of vanc   Henreitta Cea, [facility] 09/08/2020, 7:57 AM Please see Amion for pager number during day hours 7:00am-4:30pm or 7:00am -11:30am on weekends

## 2020-09-08 NOTE — Progress Notes (Signed)
Pharmacy Antibiotic Note  Vincent Robinson is a 86 y.o. male presented to the ED on 09/07/2020 with c/o fever and n/v .  Pharmacy is consulted to dose cefepime for sepsis, acute cholecystitis and suspected PNA.  -  11/18 abd/chest CT: neg for PE. Thoracic aortic aneurysm. acute cholecystitis. Patchy ground-glass pulmonary infiltrate within the left upper lobe, likely infectious or inflammatory. 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  Today, 09/08/2020: - Tmax 101.7 - scr 1.20 (crcl~34)   Plan: - cefepime 2gm IV q12h - f/u renal function, culture results and clinical status ___________________________________________  Temp (24hrs), Avg:99.5 F (37.5 C), Min:98 F (36.7 C), Max:101.7 F (38.7 C)  Recent Labs  Lab 09/07/20 2320 09/07/20 2355 09/08/20 0011  WBC 11.1*  --   --   CREATININE 1.14  --  1.20  LATICACIDVEN  --  1.5  --     CrCl cannot be calculated (Unknown ideal weight.).    No Known Allergies  Thank you for allowing pharmacy to be a part of this patient's care.  Lynelle Doctor 09/08/2020 7:20 AM

## 2020-09-09 DIAGNOSIS — K810 Acute cholecystitis: Secondary | ICD-10-CM

## 2020-09-09 LAB — COMPREHENSIVE METABOLIC PANEL
ALT: 167 U/L — ABNORMAL HIGH (ref 0–44)
AST: 147 U/L — ABNORMAL HIGH (ref 15–41)
Albumin: 2.7 g/dL — ABNORMAL LOW (ref 3.5–5.0)
Alkaline Phosphatase: 113 U/L (ref 38–126)
Anion gap: 8 (ref 5–15)
BUN: 23 mg/dL (ref 8–23)
CO2: 22 mmol/L (ref 22–32)
Calcium: 7.9 mg/dL — ABNORMAL LOW (ref 8.9–10.3)
Chloride: 103 mmol/L (ref 98–111)
Creatinine, Ser: 0.97 mg/dL (ref 0.61–1.24)
GFR, Estimated: >60 mL/min (ref >60)
Glucose, Bld: 118 mg/dL — ABNORMAL HIGH (ref 70–99)
Potassium: 3.8 mmol/L (ref 3.5–5.1)
Sodium: 133 mmol/L — ABNORMAL LOW (ref 135–145)
Total Bilirubin: 4.2 mg/dL — ABNORMAL HIGH (ref 0.3–1.2)
Total Protein: 6.2 g/dL — ABNORMAL LOW (ref 6.5–8.1)

## 2020-09-09 LAB — GLUCOSE, CAPILLARY
Glucose-Capillary: 103 mg/dL — ABNORMAL HIGH (ref 70–99)
Glucose-Capillary: 109 mg/dL — ABNORMAL HIGH (ref 70–99)
Glucose-Capillary: 109 mg/dL — ABNORMAL HIGH (ref 70–99)
Glucose-Capillary: 114 mg/dL — ABNORMAL HIGH (ref 70–99)
Glucose-Capillary: 122 mg/dL — ABNORMAL HIGH (ref 70–99)
Glucose-Capillary: 132 mg/dL — ABNORMAL HIGH (ref 70–99)

## 2020-09-09 LAB — BLOOD CULTURE ID PANEL (REFLEXED) - BCID2
A.calcoaceticus-baumannii: NOT DETECTED
Bacteroides fragilis: NOT DETECTED
Candida albicans: NOT DETECTED
Candida auris: NOT DETECTED
Candida glabrata: NOT DETECTED
Candida krusei: NOT DETECTED
Candida parapsilosis: NOT DETECTED
Candida tropicalis: NOT DETECTED
Cryptococcus neoformans/gattii: NOT DETECTED
Enterobacter cloacae complex: NOT DETECTED
Enterobacterales: NOT DETECTED
Enterococcus Faecium: NOT DETECTED
Enterococcus faecalis: NOT DETECTED
Escherichia coli: NOT DETECTED
Haemophilus influenzae: NOT DETECTED
Klebsiella aerogenes: NOT DETECTED
Klebsiella oxytoca: NOT DETECTED
Klebsiella pneumoniae: NOT DETECTED
Listeria monocytogenes: NOT DETECTED
Methicillin resistance mecA/C: DETECTED — AB
Neisseria meningitidis: NOT DETECTED
Proteus species: NOT DETECTED
Pseudomonas aeruginosa: NOT DETECTED
Salmonella species: NOT DETECTED
Serratia marcescens: NOT DETECTED
Staphylococcus aureus (BCID): NOT DETECTED
Staphylococcus epidermidis: NOT DETECTED
Staphylococcus lugdunensis: DETECTED — AB
Staphylococcus species: DETECTED — AB
Stenotrophomonas maltophilia: NOT DETECTED
Streptococcus agalactiae: NOT DETECTED
Streptococcus pneumoniae: NOT DETECTED
Streptococcus pyogenes: NOT DETECTED
Streptococcus species: NOT DETECTED

## 2020-09-09 LAB — CBC
HCT: 37.9 % — ABNORMAL LOW (ref 39.0–52.0)
Hemoglobin: 12.1 g/dL — ABNORMAL LOW (ref 13.0–17.0)
MCH: 31.9 pg (ref 26.0–34.0)
MCHC: 31.9 g/dL (ref 30.0–36.0)
MCV: 100 fL (ref 80.0–100.0)
Platelets: 204 10*3/uL (ref 150–400)
RBC: 3.79 MIL/uL — ABNORMAL LOW (ref 4.22–5.81)
RDW: 15.3 % (ref 11.5–15.5)
WBC: 11.9 10*3/uL — ABNORMAL HIGH (ref 4.0–10.5)
nRBC: 0 % (ref 0.0–0.2)

## 2020-09-09 LAB — MAGNESIUM: Magnesium: 1.7 mg/dL (ref 1.7–2.4)

## 2020-09-09 LAB — SAR COV2 SEROLOGY (COVID19)AB(IGG),IA: SARS-CoV-2 Ab, IgG: NONREACTIVE

## 2020-09-09 LAB — PHOSPHORUS: Phosphorus: 3.8 mg/dL (ref 2.5–4.6)

## 2020-09-09 MED ORDER — IPRATROPIUM-ALBUTEROL 0.5-2.5 (3) MG/3ML IN SOLN
3.0000 mL | Freq: Four times a day (QID) | RESPIRATORY_TRACT | Status: DC
  Administered 2020-09-09 – 2020-09-10 (×5): 3 mL via RESPIRATORY_TRACT
  Filled 2020-09-09 (×5): qty 3

## 2020-09-09 MED ORDER — KETOTIFEN FUMARATE 0.025 % OP SOLN
1.0000 [drp] | Freq: Two times a day (BID) | OPHTHALMIC | Status: DC
  Administered 2020-09-09 – 2020-09-19 (×17): 1 [drp] via OPHTHALMIC
  Filled 2020-09-09 (×2): qty 5

## 2020-09-09 NOTE — Progress Notes (Signed)
[facility] ([facility] Community Based [facility]iative [facility]       This patient is enrolled in our palliative care services in the community.  [facility] will continue to follow for any discharge planning needs and to coordinate continuation of palliative care.   If you have questions or need assistance, please call [phone] or contact the hospital Liaison listed on AMION.     Thank you for the opportunity to participate in this patient's care.     Chrislyn King, BSN, RN [facility] Hospital Liaison   [phone]   

## 2020-09-09 NOTE — Progress Notes (Signed)
Referring Physician(s): Saverio Danker ([place])  Supervising Physician: Aletta Edouard  Patient Status:  [facility] - In-pt  Chief Complaint: None  Subjective:  History of sepsis secondary to acute cholecystitis s/p percutaneous cholecystostomy tube placement in IR 09/08/2020. Patient awake and alert sitting in chair with no complaints at this time. Cholecystostomy tube site c/d/i.   Allergies: Patient has no known allergies.  Medications: Prior to Admission medications   Medication Sig Start Date End Date Taking? Authorizing Provider  albuterol (PROVENTIL HFA;VENTOLIN HFA) 108 (90 Base) MCG/ACT inhaler Inhale 1 puff into the lungs 2 (two) times daily. 05/20/17  Yes Mannam, Praveen, MD  fluticasone (VERAMYST) 27.5 MCG/SPRAY nasal spray Place 1 spray into the nose daily as needed for rhinitis.    Yes [provider]  Fluticasone-Salmeterol,sensor, 113-14 MCG/ACT AEPB Inhale 1 puff into the lungs 2 (two) times daily.   Yes [provider]  levothyroxine (SYNTHROID, LEVOTHROID) 112 MCG tablet Take 112 mcg by mouth daily before breakfast.   Yes [provider]  loratadine (CLARITIN) 10 MG tablet Take 10 mg by mouth daily as needed for allergies.    Yes [provider]  macitentan (OPSUMIT) 10 MG tablet Take 1 tablet (10 mg total) by mouth daily. Needs appt for future refills 08/19/20  Yes Bensimhon, Shaune Pascal, MD  montelukast (SINGULAIR) 10 MG tablet Take 10 mg by mouth at bedtime.   Yes [provider]  pantoprazole (PROTONIX) 20 MG tablet Take 20 mg by mouth 2 (two) times daily.    Yes [provider]  simvastatin (ZOCOR) 10 MG tablet Take 10 mg by mouth at bedtime.   Yes [provider]  torsemide (DEMADEX) 20 MG tablet Take 2 tablets (40 mg total) by mouth daily. 03/03/20  Yes Bensimhon, Shaune Pascal, MD  ferrous sulfate 325 (65 FE) MG tablet Take 1 tablet (325 mg total) by mouth 2 (two) times daily with a meal. Patient not taking:  Reported on 09/07/2020 01/28/20   Bensimhon, Shaune Pascal, MD  midodrine (PROAMATINE) 10 MG tablet Take 1 tablet (10 mg total) by mouth 3 (three) times daily. Patient not taking: Reported on 09/07/2020 03/03/20   Bensimhon, Shaune Pascal, MD  potassium chloride 20 MEQ/15ML (10%) SOLN Take 15 mLs (20 mEq total) by mouth 2 (two) times daily. Patient not taking: Reported on 09/07/2020 12/23/19   [name], MD  sildenafil (REVATIO) 20 MG tablet Take 2 tablets (40 mg total) by mouth 3 (three) times daily. Patient not taking: Reported on 09/07/2020 10/21/19   [name], PA-C  spironolactone (ALDACTONE) 25 MG tablet Take 0.5 tablets (12.5 mg total) by mouth daily. Patient not taking: Reported on 09/07/2020 12/24/19   [name], MD     Vital Signs: BP 124/71   Pulse 64   Temp 98.1 F (36.7 C) (Oral)   Resp (!) 22   Ht 5\' 7"  (1.702 m)   Wt 169 lb 12.1 oz (77 kg)   SpO2 90%   BMI 26.59 kg/m   Physical Exam Vitals and nursing note reviewed.  Constitutional:      General: He is not in acute distress.    Appearance: Normal appearance.  Pulmonary:     Effort: Pulmonary effort is normal. No respiratory distress.  Abdominal:     Comments: Cholecystostomy tube site without tenderness, erythema, drainage, or active bleeding; approximately 10-20 cc clear yellow bile in gravity bag.  Skin:    General: Skin is warm and dry.  Neurological:  Mental Status: He is alert and oriented to person, place, and time.     Imaging: CT Angio Chest PE W and/or Wo Contrast  Result Date: 09/08/2020 CLINICAL DATA:  Fever, chills, leukocytosis, thoracoabdominal aortic aneurysm, respiratory failure EXAM: CT ANGIOGRAPHY CHEST CT ABDOMEN AND PELVIS WITH CONTRAST TECHNIQUE: Multidetector CT imaging of the chest was performed using the standard protocol during bolus administration of intravenous contrast. Multiplanar CT image reconstructions and MIPs were obtained to evaluate the vascular anatomy.  Multidetector CT imaging of the abdomen and pelvis was performed using the standard protocol during bolus administration of intravenous contrast. CONTRAST:  164mL OMNIPAQUE IOHEXOL 350 MG/ML SOLN COMPARISON:  CTA chest 11/22/2019, CT abdomen pelvis 07/14/2020 the TI is FINDINGS: CTA CHEST FINDINGS Cardiovascular: There is excellent opacification of the pulmonary arterial tree. No intraluminal filling defect identified to suggest acute pulmonary embolism. Central pulmonary arteries are enlarged in keeping with changes of pulmonary arterial hypertension, unchanged from prior examination. Coronary artery bypass grafting has been performed. There is mild global cardiomegaly with particular enlargement of the right atrium and right ventricle. There is reflux of contrast into the hepatic caval junction in keeping with at least some degree of right heart failure. Extensive calcification of the a aortic valve leaflets. No pericardial effusion. The ascending aorta is dilated measuring 4.5 cm in greatest dimension, stable since prior examination. The aortic arch measures 3.1 cm in greatest dimension. The descending thoracic aorta at the level of the left atrium measures 2.8 cm in greatest dimension. Mediastinum/Nodes: No pathologic thoracic adenopathy. Esophagus unremarkable. Lungs/Pleura: Imaging is slightly limited by motion artifact. There has developed progressive subpleural reticulation throughout the lungs likely representing mild fibrotic change. There is, however, more focal superimposed ground-glass pulmonary infiltrate within the left upper lobe, likely infectious or inflammatory in the acute setting. No pneumothorax or pleural effusion. Central airways are widely patent. Musculoskeletal: The osseous structures are age-appropriate. No acute bone abnormality peer Review of the MIP images confirms the above findings. CT ABDOMEN and PELVIS FINDINGS Hepatobiliary: The gallbladder is distended, there is marked  gallbladder wall thickening, and extensive pericholecystic inflammatory stranding in keeping with changes of acute cholecystitis. Focal intrahepatic biliary ductal dilation within the inferior right hepatic lobe and lateral segment of the left hepatic lobe is unchanged from prior examination, possibly the sequela of remote trauma or inflammation. The liver is otherwise unremarkable. No extrahepatic biliary ductal dilation peer Pancreas: Unremarkable Spleen: Unremarkable Adrenals/Urinary Tract: The adrenal glands are unremarkable. The kidneys are normal. A 2.3 cm enhancing mural mass is again identified within the right posterior basal wall of the bladder compatible with a a urothelial carcinoma this appears similar to prior examination. The mass does not clearly demonstrate transmural extension at this time. The mass is in close proximity to the right ureterovesicular junction. The bladder is otherwise unremarkable. Stomach/Bowel: Extensive descending and sigmoid colonic diverticulosis. The sigmoid colon is redundant. The stomach, small bowel, and large bowel are otherwise unremarkable. The appendix is not clearly identified, however, there are no secondary signs of appendicitis within the pericecal region. No free intraperitoneal gas or fluid. Vascular/Lymphatic: Infrarenal abdominal aortic aneurysm has undergone endovascular repair. The maximal diameter of the a aneurysm sac is stable at 4.5 cm. Moderate atherosclerotic calcifications seen within the lower extremity arterial outflow. The abdominal vasculature is otherwise unremarkable. No pathologic adenopathy within the abdomen and pelvis. Reproductive: Prostate is unremarkable. Other: Rectum unremarkable Musculoskeletal: Degenerative changes are seen within the a lumbar spine. Osseous structures are age-appropriate. No lytic or  blastic bone lesions are seen. Review of the MIP images confirms the above findings. IMPRESSION: No pulmonary embolism. Morphologic  changes of pulmonary arterial hypertension, elevated right heart pressures, and at least some degree of right heart failure. Stable 4.5 cm thoracic aortic aneurysm. Ascending thoracic aortic aneurysm. Recommend semi-annual imaging followup by CTA or MRA and referral to cardiothoracic surgery if not already obtained. This recommendation follows 2010 ACCF/AHA/AATS/ACR/ASA/SCA/SCAI/SIR/STS/SVM Guidelines for the Diagnosis and Management of Patients With Thoracic Aortic Disease. Circulation. 2010; 121: R416-L845. Aortic aneurysm NOS (ICD10-I71.9) Patchy ground-glass pulmonary infiltrate within the left upper lobe, likely infectious or inflammatory. Acute cholecystitis. Stable infrarenal abdominal aortic aneurysm, status post endovascular repair, at 4.5 cm. 2.3 cm enhancing urothelial carcinoma within the right posterior bladder lumen in the region of the right ureterovesicular junction. No definite evidence of transmural extension or regional lymphadenopathy. Aortic aneurysm NOS (ICD10-I71.9). Aortic Atherosclerosis (ICD10-I70.0). Electronically Signed   By: [name] MD   On: 09/08/2020 03:09   CT ABDOMEN PELVIS W CONTRAST  Result Date: 09/08/2020 CLINICAL DATA:  Fever, chills, leukocytosis, thoracoabdominal aortic aneurysm, respiratory failure EXAM: CT ANGIOGRAPHY CHEST CT ABDOMEN AND PELVIS WITH CONTRAST TECHNIQUE: Multidetector CT imaging of the chest was performed using the standard protocol during bolus administration of intravenous contrast. Multiplanar CT image reconstructions and MIPs were obtained to evaluate the vascular anatomy. Multidetector CT imaging of the abdomen and pelvis was performed using the standard protocol during bolus administration of intravenous contrast. CONTRAST:  115mL OMNIPAQUE IOHEXOL 350 MG/ML SOLN COMPARISON:  CTA chest 11/22/2019, CT abdomen pelvis 07/14/2020 the TI is FINDINGS: CTA CHEST FINDINGS Cardiovascular: There is excellent opacification of the pulmonary arterial  tree. No intraluminal filling defect identified to suggest acute pulmonary embolism. Central pulmonary arteries are enlarged in keeping with changes of pulmonary arterial hypertension, unchanged from prior examination. Coronary artery bypass grafting has been performed. There is mild global cardiomegaly with particular enlargement of the right atrium and right ventricle. There is reflux of contrast into the hepatic caval junction in keeping with at least some degree of right heart failure. Extensive calcification of the a aortic valve leaflets. No pericardial effusion. The ascending aorta is dilated measuring 4.5 cm in greatest dimension, stable since prior examination. The aortic arch measures 3.1 cm in greatest dimension. The descending thoracic aorta at the level of the left atrium measures 2.8 cm in greatest dimension. Mediastinum/Nodes: No pathologic thoracic adenopathy. Esophagus unremarkable. Lungs/Pleura: Imaging is slightly limited by motion artifact. There has developed progressive subpleural reticulation throughout the lungs likely representing mild fibrotic change. There is, however, more focal superimposed ground-glass pulmonary infiltrate within the left upper lobe, likely infectious or inflammatory in the acute setting. No pneumothorax or pleural effusion. Central airways are widely patent. Musculoskeletal: The osseous structures are age-appropriate. No acute bone abnormality peer Review of the MIP images confirms the above findings. CT ABDOMEN and PELVIS FINDINGS Hepatobiliary: The gallbladder is distended, there is marked gallbladder wall thickening, and extensive pericholecystic inflammatory stranding in keeping with changes of acute cholecystitis. Focal intrahepatic biliary ductal dilation within the inferior right hepatic lobe and lateral segment of the left hepatic lobe is unchanged from prior examination, possibly the sequela of remote trauma or inflammation. The liver is otherwise  unremarkable. No extrahepatic biliary ductal dilation peer Pancreas: Unremarkable Spleen: Unremarkable Adrenals/Urinary Tract: The adrenal glands are unremarkable. The kidneys are normal. A 2.3 cm enhancing mural mass is again identified within the right posterior basal wall of the bladder compatible with a a urothelial carcinoma this  appears similar to prior examination. The mass does not clearly demonstrate transmural extension at this time. The mass is in close proximity to the right ureterovesicular junction. The bladder is otherwise unremarkable. Stomach/Bowel: Extensive descending and sigmoid colonic diverticulosis. The sigmoid colon is redundant. The stomach, small bowel, and large bowel are otherwise unremarkable. The appendix is not clearly identified, however, there are no secondary signs of appendicitis within the pericecal region. No free intraperitoneal gas or fluid. Vascular/Lymphatic: Infrarenal abdominal aortic aneurysm has undergone endovascular repair. The maximal diameter of the a aneurysm sac is stable at 4.5 cm. Moderate atherosclerotic calcifications seen within the lower extremity arterial outflow. The abdominal vasculature is otherwise unremarkable. No pathologic adenopathy within the abdomen and pelvis. Reproductive: Prostate is unremarkable. Other: Rectum unremarkable Musculoskeletal: Degenerative changes are seen within the a lumbar spine. Osseous structures are age-appropriate. No lytic or blastic bone lesions are seen. Review of the MIP images confirms the above findings. IMPRESSION: No pulmonary embolism. Morphologic changes of pulmonary arterial hypertension, elevated right heart pressures, and at least some degree of right heart failure. Stable 4.5 cm thoracic aortic aneurysm. Ascending thoracic aortic aneurysm. Recommend semi-annual imaging followup by CTA or MRA and referral to cardiothoracic surgery if not already obtained. This recommendation follows 2010  ACCF/AHA/AATS/ACR/ASA/SCA/SCAI/SIR/STS/SVM Guidelines for the Diagnosis and Management of Patients With Thoracic Aortic Disease. Circulation. 2010; 121: X381-W299. Aortic aneurysm NOS (ICD10-I71.9) Patchy ground-glass pulmonary infiltrate within the left upper lobe, likely infectious or inflammatory. Acute cholecystitis. Stable infrarenal abdominal aortic aneurysm, status post endovascular repair, at 4.5 cm. 2.3 cm enhancing urothelial carcinoma within the right posterior bladder lumen in the region of the right ureterovesicular junction. No definite evidence of transmural extension or regional lymphadenopathy. Aortic aneurysm NOS (ICD10-I71.9). Aortic Atherosclerosis (ICD10-I70.0). Electronically Signed   By: [name] MD   On: 09/08/2020 03:09   IR Perc Cholecystostomy  Result Date: 09/08/2020 CLINICAL DATA:  Sepsis, hypotension, acute cholecystitis suspected on recent CT EXAM: PERCUTANEOUS CHOLECYSTOSTOMY TUBE PLACEMENT WITH ULTRASOUND AND FLUOROSCOPIC GUIDANCE FLUOROSCOPY TIME:  30 seconds; 6 mGy TECHNIQUE: The procedure, risks (including but not limited to bleeding, infection, organ damage ), benefits, and alternatives were explained to the patient. Questions regarding the procedure were encouraged and answered. The patient understands and consents to the procedure. Survey ultrasound of the abdomen was performed and an appropriate skin entry site was identified. Skin site was marked, prepped with chlorhexidine, and draped in usual sterile fashion, and infiltrated locally with 1% lidocaine. Intravenous versed 1mg  were administered as conscious sedation during continuous monitoring of the patient's level of consciousness and physiological / cardiorespiratory status by the radiology RN, with a total moderate sedation time of 16 minutes. Under real-time ultrasound guidance, gallbladder was accessed using a lateral approach with a 21-gauge needle. Ultrasound image documentation was saved. Bile returned  through the hub. Needle was exchanged over a 018 guidewire for transitional dilator which allowed placement of 035 J wire. Over this, a 10.2 French pigtail catheter was advanced and formed centrally in the gallbladder lumen. 20 mL dark green bile aspirated, sent for culture. Small contrast injection confirmed appropriate position. Catheter secured externally with 0 Prolene suture and placed external drain bag. Patient tolerated the procedure well. COMPLICATIONS: COMPLICATIONS none IMPRESSION: 1. Technically successful percutaneous cholecystostomy tube placement with ultrasound and fluoroscopic guidance. Electronically Signed   By: Lucrezia Europe M.D.   On: 09/08/2020 16:53   DG CHEST PORT 1 VIEW  Result Date: 09/08/2020 CLINICAL DATA:  Respiratory failure EXAM: PORTABLE CHEST 1 VIEW  COMPARISON:  September 07, 2020 chest radiograph and chest CT September 08, 2020 FINDINGS: Central catheter tip is in the superior vena cava. No pneumothorax. Note that mandible obscures portions of each medial apex. There is interstitial pulmonary edema, most notably in the bases. There is no airspace consolidation. Heart is mildly enlarged with pulmonary venous hypertension. Status post median sternotomy. No adenopathy. No bone lesions. IMPRESSION: Central catheter tip in superior vena cava without pneumothorax. Mild cardiomegaly with a degree of pulmonary vascular congestion. There is a degree of interstitial edema. The overall appearance is indicative of a degree of congestive heart failure. No airspace consolidation. Electronically Signed   By: Lowella Grip III M.D.   On: 09/08/2020 19:19   DG Chest Portable 1 View  Result Date: 09/08/2020 CLINICAL DATA:  Initial evaluation for acute shortness of breath. EXAM: PORTABLE CHEST 1 VIEW COMPARISON:  Prior radiograph from 12/21/2019. FINDINGS: Median sternotomy wires underlying CABG markers noted. Mild cardiomegaly, stable. Mediastinal silhouette within normal limits. Lungs are  hypoinflated with probable underlying interstitial lung disease and/or fibrotic changes. No focal infiltrates or consolidative opacity. Mild perihilar vascular congestion without overt pulmonary edema. No pleural effusion. No pneumothorax. No acute osseous finding. IMPRESSION: 1. Shallow lung inflation with probable underlying interstitial lung disease and/or fibrotic changes. 2. Mild perihilar vascular congestion without overt pulmonary edema. 3. No other active cardiopulmonary disease. Electronically Signed   By: Jeannine Boga M.D.   On: 09/08/2020 01:09   ECHOCARDIOGRAM COMPLETE  Result Date: 09/08/2020    ECHOCARDIOGRAM REPORT   Patient Name:   Vincent Robinson Date of Exam: 09/08/2020 Medical Rec #:  [id number]      Height:       67.0 in Accession #:    [id number]     Weight:       155.0 lb Date of Birth:  [birth year]       BSA:          1.815 m Patient Age:    86 years       BP:           96/54 mmHg Patient Gender: M              HR:           59 bpm. Exam Location:  Inpatient Procedure: 2D Echo and Intracardiac Opacification Agent Indications:    Acute Respiratory Insufficiency 518.82 / R06.89  History:        Patient has prior history of Echocardiogram examinations, most                 recent 11/23/2019. CHF, Pulmonary HTN and COPD, Aortic Valve                 Disease; Risk Factors:Sleep Apnea, Former Smoker and                 Dyslipidemia. AAA s/p Thoracic Stent. Asthma. Hypothyroid.                 Chronic Hypoxic Respiratory Failure.  Sonographer:    [name] RDCS Referring Phys: [place]  1. Left ventricular ejection fraction, by estimation, is 55 to 60%. The left ventricle has normal function. There is moderate asymmetric left ventricular hypertrophy of the basal-septal segment. Left ventricular diastolic parameters are consistent with Grade II diastolic dysfunction (pseudonormalization). Elevated left atrial pressure. There is the interventricular septum is  flattened in systole and diastole, consistent with right ventricular pressure and volume overload.  2. Right ventricular systolic function is moderately reduced. The right ventricular size is severely enlarged. There is severely elevated pulmonary artery systolic pressure. The estimated right ventricular systolic pressure is 93.2 mmHg.  3. Left atrial size was mildly dilated.  4. The mitral valve is normal in structure. Mild mitral valve regurgitation. No evidence of mitral stenosis.  5. Tricuspid valve regurgitation is moderate to severe.  6. The aortic valve is calcified. There is moderate calcification of the aortic valve. Aortic valve regurgitation is mild. Moderate aortic valve stenosis. Vmax 3.0 m/s, MG 20 mmHg, AVA 1.45 cm^2, DI 0.29  7. Aortic dilatation noted. There is mild dilatation of the ascending aorta, measuring 38 mm.  8. The inferior vena cava is dilated in size with <50% respiratory variability, suggesting right atrial pressure of 15 mmHg. FINDINGS  Left Ventricle: Left ventricular ejection fraction, by estimation, is 55 to 60%. The left ventricle has normal function. The left ventricle has no regional wall motion abnormalities. Definity contrast agent was given IV to delineate the left ventricular  endocardial borders. The left ventricular internal cavity size was normal in size. There is moderate asymmetric left ventricular hypertrophy of the basal-septal segment. The interventricular septum is flattened in systole and diastole, consistent with right ventricular pressure and volume overload. Left ventricular diastolic parameters are consistent with Grade II diastolic dysfunction (pseudonormalization). Elevated left atrial pressure. Right Ventricle: The right ventricular size is severely enlarged. Right vetricular wall thickness was not assessed. Right ventricular systolic function is moderately reduced. There is severely elevated pulmonary artery systolic pressure. The tricuspid regurgitant  velocity is 4.08 m/s, and with an assumed right atrial pressure of 15 mmHg, the estimated right ventricular systolic pressure is 67.1 mmHg. Left Atrium: Left atrial size was mildly dilated. Right Atrium: Right atrial size was not well visualized. Pericardium: There is no evidence of pericardial effusion. Mitral Valve: The mitral valve is normal in structure. Mild mitral valve regurgitation. No evidence of mitral valve stenosis. Tricuspid Valve: The tricuspid valve is normal in structure. Tricuspid valve regurgitation is moderate to severe. Aortic Valve: The aortic valve is calcified. There is moderate calcification of the aortic valve. Aortic valve regurgitation is mild. Moderate aortic stenosis is present. Aortic valve mean gradient measures 16.2 mmHg. Aortic valve peak gradient measures 28.5 mmHg. Aortic valve area, by VTI measures 1.60 cm. Pulmonic Valve: The pulmonic valve was not well visualized. Pulmonic valve regurgitation is not visualized. Aorta: The aortic root is normal in size and structure and aortic dilatation noted. There is mild dilatation of the ascending aorta, measuring 38 mm. Venous: The inferior vena cava is dilated in size with less than 50% respiratory variability, suggesting right atrial pressure of 15 mmHg. IAS/Shunts: The interatrial septum was not well visualized.  LEFT VENTRICLE PLAX 2D LVIDd:         4.10 cm      Diastology LVIDs:         2.90 cm      LV e' medial:    7.27 cm/s LV PW:         1.30 cm      LV E/e' medial:  12.3 LV IVS:        1.50 cm      LV e' lateral:   9.46 cm/s LVOT diam:     2.45 cm      LV E/e' lateral: 9.4 LV SV:         94 LV SV Index:   52 LVOT Area:  4.71 cm  LV Volumes (MOD) LV vol d, MOD A2C: 98.7 ml LV vol d, MOD A4C: 103.0 ml LV vol s, MOD A2C: 42.0 ml LV vol s, MOD A4C: 64.6 ml LV SV MOD A2C:     56.7 ml LV SV MOD A4C:     103.0 ml LV SV MOD BP:      49.1 ml RIGHT VENTRICLE RV S prime:     7.45 cm/s TAPSE (M-mode): 1.6 cm LEFT ATRIUM             Index  LA diam:        4.20 cm 2.31 cm/m LA Vol (A2C):   74.8 ml 41.22 ml/m LA Vol (A4C):   59.3 ml 32.68 ml/m LA Biplane Vol: 68.3 ml 37.64 ml/m  AORTIC VALVE AV Area (Vmax):    1.64 cm AV Area (Vmean):   1.70 cm AV Area (VTI):     1.60 cm AV Vmax:           267.00 cm/s AV Vmean:          186.500 cm/s AV VTI:            0.590 m AV Peak Grad:      28.5 mmHg AV Mean Grad:      16.2 mmHg LVOT Vmax:         92.70 cm/s LVOT Vmean:        67.200 cm/s LVOT VTI:          0.200 m LVOT/AV VTI ratio: 0.34  AORTA Ao Root diam: 3.70 cm Ao Asc diam:  3.80 cm MITRAL VALVE               TRICUSPID VALVE MV Area (PHT): 5.84 cm    TR Peak grad:   66.6 mmHg MV Decel Time: 130 msec    TR Vmax:        408.00 cm/s MV E velocity: 89.10 cm/s MV A velocity: 61.70 cm/s  SHUNTS MV E/A ratio:  1.44        Systemic VTI:  0.20 m                            Systemic Diam: 2.45 cm Oswaldo Milian MD Electronically signed by Oswaldo Milian MD Signature Date/Time: 09/08/2020/7:27:55 PM    Final    [place] EKG SITE RITE  Result Date: 09/08/2020 If Site Rite image not attached, placement could not be confirmed due to current cardiac rhythm.   Labs:  CBC: Recent Labs    03/25/20 0514 03/25/20 0514 03/26/20 0919 09/07/20 2320 09/08/20 0740 09/09/20 0500  WBC 9.7  --   --  11.1* 12.6* 11.9*  HGB 7.4*   < > 8.6* 13.4 12.1* 12.1*  HCT 22.5*   < > 26.5* 40.1 37.3* 37.9*  PLT 245  --   --  245 232 204   < > = values in this interval not displayed.    COAGS: Recent Labs    03/22/20 2008 09/07/20 2355 09/08/20 0740  INR 1.2 1.1 1.3*  APTT  --  32  --     BMP: Recent Labs    03/22/20 0042 03/22/20 0042 03/22/20 0426 03/22/20 0426 03/23/20 0542 03/23/20 0542 03/25/20 0514 03/25/20 0514 09/07/20 2320 09/08/20 0011 09/08/20 0740 09/09/20 0500  NA 140   < > 139   < > 138   < > 136  --  137  --  136 133*  K 3.9   < > 3.9   < > 4.0   < > 4.2  --  3.3*  --  3.6 3.8  CL 102   < > 105   < > 104   < > 105  --   102  --  102 103  CO2 28   < > 25   < > 23   < > 24  --  24  --  24 22  GLUCOSE 112*   < > 107*   < > 103*   < > 96  --  113*  --  129* 118*  BUN 23   < > 23   < > 28*   < > 25*  --  26*  --  25* 23  CALCIUM 8.6*   < > 8.0*   < > 8.1*   < > 8.0*  --  8.4*  --  7.9* 7.9*  CREATININE 1.39*   < > 1.24   < > 0.99   < > 0.97   < > 1.14 1.20 1.19 0.97  GFRNONAA 43*   < > 49*   < > >60   < > >60  --  59*  --  56* >60  GFRAA 50*  --  57*  --  >60  --  >60  --   --   --   --   --    < > = values in this interval not displayed.    LIVER FUNCTION TESTS: Recent Labs    12/18/19 0135 12/19/19 0213 12/23/19 0746 09/07/20 2320 09/08/20 0740 09/09/20 0500  BILITOT 0.6  --   --  2.7* 3.7* 4.2*  AST 19  --   --  608* 439* 147*  ALT 11  --   --  297* 278* 167*  ALKPHOS 67  --   --  227* 184* 113  PROT 7.0  --   --  7.8 6.9 6.2*  ALBUMIN 2.6*   < > 2.8* 3.8 3.2* 2.7*   < > = values in this interval not displayed.    Assessment and Plan:  History of sepsis secondary to acute cholecystitis s/p percutaneous cholecystostomy tube placement in IR 09/08/2020. Cholecystostomy tube stable with approximately 10-20 cc clear yellow bile in gravity bag (additional 120 cc output from drain in past 24 hours per RN). Continue current drain management- continue with Qshift flushes/monitor of output. Plan for repeat cholangiogram/possible exchange in 6-8 weeks unless cholecystectomy occurs in the interim. Further plans per CCM/CCS- appreciate and agree with management. IR to follow.   Electronically Signed: Earley Abide, PA-C 09/09/2020, 4:25 PM   I spent a total of 25 Minutes at the the patient's bedside AND on the patient's hospital floor or unit, greater than 50% of which was counseling/coordinating care for acute cholecystitis s/p cholecystostomy tube placement.

## 2020-09-09 NOTE — Progress Notes (Signed)
Subjective: Patient feels well this morning.  Denies abdominal pain.  Reviewed pharmacy note regarding multiple staph species in blood cultures along with methicillin resistant mecA/C (?)   ROS: See above, otherwise other systems negative  Objective: Vital signs in last 24 hours: Temp:  [96.4 F (35.8 C)-99.3 F (37.4 C)] 98.4 F (36.9 C) (11/18 2100) Pulse Rate:  [38-98] 50 (11/19 0600) Resp:  [15-37] 17 (11/19 0600) BP: (80-164)/(21-98) 112/33 (11/19 0600) SpO2:  [80 %-100 %] 96 % (11/19 0600) FiO2 (%):  [95 %-100 %] 100 % (11/18 2022) Weight:  [77 kg] 77 kg (11/19 0500)    Intake/Output from previous day: 11/18 0701 - 11/19 0700 In: 2560.2 [P.O.:240; I.V.:1206.4; IV Piggyback:1113.8] Out: 650 [Urine:520; Drains:130] Intake/Output this shift: No intake/output data recorded.  PE: Heart: brady, but irregular, + murmur Lungs: CTAB, some decrease as bases, still on HFNC Abd: soft, NT except appropriately around his perc chole drain, drain with bilious output.  +BS  Lab Results:  Recent Labs    09/08/20 0740 09/09/20 0500  WBC 12.6* 11.9*  HGB 12.1* 12.1*  HCT 37.3* 37.9*  PLT 232 204   BMET Recent Labs    09/08/20 0740 09/09/20 0500  NA 136 133*  K 3.6 3.8  CL 102 103  CO2 24 22  GLUCOSE 129* 118*  BUN 25* 23  CREATININE 1.19 0.97  CALCIUM 7.9* 7.9*   PT/INR Recent Labs    09/07/20 2355 09/08/20 0740  LABPROT 14.0 15.2  INR 1.1 1.3*   CMP     Component Value Date/Time   NA 133 (L) 09/09/2020 0500   K 3.8 09/09/2020 0500   CL 103 09/09/2020 0500   CO2 22 09/09/2020 0500   GLUCOSE 118 (H) 09/09/2020 0500   BUN 23 09/09/2020 0500   CREATININE 0.97 09/09/2020 0500   CALCIUM 7.9 (L) 09/09/2020 0500   PROT 6.2 (L) 09/09/2020 0500   ALBUMIN 2.7 (L) 09/09/2020 0500   AST 147 (H) 09/09/2020 0500   ALT 167 (H) 09/09/2020 0500   ALKPHOS 113 09/09/2020 0500   BILITOT 4.2 (H) 09/09/2020 0500   GFRNONAA >60 09/09/2020 0500   GFRAA >60  03/25/2020 0514   Lipase  No results found for: LIPASE     Studies/Results: CT Angio Chest PE W and/or Wo Contrast  Result Date: 09/08/2020 CLINICAL DATA:  Fever, chills, leukocytosis, thoracoabdominal aortic aneurysm, respiratory failure EXAM: CT ANGIOGRAPHY CHEST CT ABDOMEN AND PELVIS WITH CONTRAST TECHNIQUE: Multidetector CT imaging of the chest was performed using the standard protocol during bolus administration of intravenous contrast. Multiplanar CT image reconstructions and MIPs were obtained to evaluate the vascular anatomy. Multidetector CT imaging of the abdomen and pelvis was performed using the standard protocol during bolus administration of intravenous contrast. CONTRAST:  141mL OMNIPAQUE IOHEXOL 350 MG/ML SOLN COMPARISON:  CTA chest 11/22/2019, CT abdomen pelvis 07/14/2020 the TI is FINDINGS: CTA CHEST FINDINGS Cardiovascular: There is excellent opacification of the pulmonary arterial tree. No intraluminal filling defect identified to suggest acute pulmonary embolism. Central pulmonary arteries are enlarged in keeping with changes of pulmonary arterial hypertension, unchanged from prior examination. Coronary artery bypass grafting has been performed. There is mild global cardiomegaly with particular enlargement of the right atrium and right ventricle. There is reflux of contrast into the hepatic caval junction in keeping with at least some degree of right heart failure. Extensive calcification of the a aortic valve leaflets. No pericardial effusion. The ascending aorta is dilated measuring 4.5  cm in greatest dimension, stable since prior examination. The aortic arch measures 3.1 cm in greatest dimension. The descending thoracic aorta at the level of the left atrium measures 2.8 cm in greatest dimension. Mediastinum/Nodes: No pathologic thoracic adenopathy. Esophagus unremarkable. Lungs/Pleura: Imaging is slightly limited by motion artifact. There has developed progressive subpleural  reticulation throughout the lungs likely representing mild fibrotic change. There is, however, more focal superimposed ground-glass pulmonary infiltrate within the left upper lobe, likely infectious or inflammatory in the acute setting. No pneumothorax or pleural effusion. Central airways are widely patent. Musculoskeletal: The osseous structures are age-appropriate. No acute bone abnormality peer Review of the MIP images confirms the above findings. CT ABDOMEN and PELVIS FINDINGS Hepatobiliary: The gallbladder is distended, there is marked gallbladder wall thickening, and extensive pericholecystic inflammatory stranding in keeping with changes of acute cholecystitis. Focal intrahepatic biliary ductal dilation within the inferior right hepatic lobe and lateral segment of the left hepatic lobe is unchanged from prior examination, possibly the sequela of remote trauma or inflammation. The liver is otherwise unremarkable. No extrahepatic biliary ductal dilation peer Pancreas: Unremarkable Spleen: Unremarkable Adrenals/Urinary Tract: The adrenal glands are unremarkable. The kidneys are normal. A 2.3 cm enhancing mural mass is again identified within the right posterior basal wall of the bladder compatible with a a urothelial carcinoma this appears similar to prior examination. The mass does not clearly demonstrate transmural extension at this time. The mass is in close proximity to the right ureterovesicular junction. The bladder is otherwise unremarkable. Stomach/Bowel: Extensive descending and sigmoid colonic diverticulosis. The sigmoid colon is redundant. The stomach, small bowel, and large bowel are otherwise unremarkable. The appendix is not clearly identified, however, there are no secondary signs of appendicitis within the pericecal region. No free intraperitoneal gas or fluid. Vascular/Lymphatic: Infrarenal abdominal aortic aneurysm has undergone endovascular repair. The maximal diameter of the a aneurysm sac is  stable at 4.5 cm. Moderate atherosclerotic calcifications seen within the lower extremity arterial outflow. The abdominal vasculature is otherwise unremarkable. No pathologic adenopathy within the abdomen and pelvis. Reproductive: Prostate is unremarkable. Other: Rectum unremarkable Musculoskeletal: Degenerative changes are seen within the a lumbar spine. Osseous structures are age-appropriate. No lytic or blastic bone lesions are seen. Review of the MIP images confirms the above findings. IMPRESSION: No pulmonary embolism. Morphologic changes of pulmonary arterial hypertension, elevated right heart pressures, and at least some degree of right heart failure. Stable 4.5 cm thoracic aortic aneurysm. Ascending thoracic aortic aneurysm. Recommend semi-annual imaging followup by CTA or MRA and referral to cardiothoracic surgery if not already obtained. This recommendation follows 2010 ACCF/AHA/AATS/ACR/ASA/SCA/SCAI/SIR/STS/SVM Guidelines for the Diagnosis and Management of Patients With Thoracic Aortic Disease. Circulation. 2010; 121: D983-J825. Aortic aneurysm NOS (ICD10-I71.9) Patchy ground-glass pulmonary infiltrate within the left upper lobe, likely infectious or inflammatory. Acute cholecystitis. Stable infrarenal abdominal aortic aneurysm, status post endovascular repair, at 4.5 cm. 2.3 cm enhancing urothelial carcinoma within the right posterior bladder lumen in the region of the right ureterovesicular junction. No definite evidence of transmural extension or regional lymphadenopathy. Aortic aneurysm NOS (ICD10-I71.9). Aortic Atherosclerosis (ICD10-I70.0). Electronically Signed   By: [name] MD   On: 09/08/2020 03:09   CT ABDOMEN PELVIS W CONTRAST  Result Date: 09/08/2020 CLINICAL DATA:  Fever, chills, leukocytosis, thoracoabdominal aortic aneurysm, respiratory failure EXAM: CT ANGIOGRAPHY CHEST CT ABDOMEN AND PELVIS WITH CONTRAST TECHNIQUE: Multidetector CT imaging of the chest was performed using the  standard protocol during bolus administration of intravenous contrast. Multiplanar CT image reconstructions and MIPs were  obtained to evaluate the vascular anatomy. Multidetector CT imaging of the abdomen and pelvis was performed using the standard protocol during bolus administration of intravenous contrast. CONTRAST:  174mL OMNIPAQUE IOHEXOL 350 MG/ML SOLN COMPARISON:  CTA chest 11/22/2019, CT abdomen pelvis 07/14/2020 the TI is FINDINGS: CTA CHEST FINDINGS Cardiovascular: There is excellent opacification of the pulmonary arterial tree. No intraluminal filling defect identified to suggest acute pulmonary embolism. Central pulmonary arteries are enlarged in keeping with changes of pulmonary arterial hypertension, unchanged from prior examination. Coronary artery bypass grafting has been performed. There is mild global cardiomegaly with particular enlargement of the right atrium and right ventricle. There is reflux of contrast into the hepatic caval junction in keeping with at least some degree of right heart failure. Extensive calcification of the a aortic valve leaflets. No pericardial effusion. The ascending aorta is dilated measuring 4.5 cm in greatest dimension, stable since prior examination. The aortic arch measures 3.1 cm in greatest dimension. The descending thoracic aorta at the level of the left atrium measures 2.8 cm in greatest dimension. Mediastinum/Nodes: No pathologic thoracic adenopathy. Esophagus unremarkable. Lungs/Pleura: Imaging is slightly limited by motion artifact. There has developed progressive subpleural reticulation throughout the lungs likely representing mild fibrotic change. There is, however, more focal superimposed ground-glass pulmonary infiltrate within the left upper lobe, likely infectious or inflammatory in the acute setting. No pneumothorax or pleural effusion. Central airways are widely patent. Musculoskeletal: The osseous structures are age-appropriate. No acute bone  abnormality peer Review of the MIP images confirms the above findings. CT ABDOMEN and PELVIS FINDINGS Hepatobiliary: The gallbladder is distended, there is marked gallbladder wall thickening, and extensive pericholecystic inflammatory stranding in keeping with changes of acute cholecystitis. Focal intrahepatic biliary ductal dilation within the inferior right hepatic lobe and lateral segment of the left hepatic lobe is unchanged from prior examination, possibly the sequela of remote trauma or inflammation. The liver is otherwise unremarkable. No extrahepatic biliary ductal dilation peer Pancreas: Unremarkable Spleen: Unremarkable Adrenals/Urinary Tract: The adrenal glands are unremarkable. The kidneys are normal. A 2.3 cm enhancing mural mass is again identified within the right posterior basal wall of the bladder compatible with a a urothelial carcinoma this appears similar to prior examination. The mass does not clearly demonstrate transmural extension at this time. The mass is in close proximity to the right ureterovesicular junction. The bladder is otherwise unremarkable. Stomach/Bowel: Extensive descending and sigmoid colonic diverticulosis. The sigmoid colon is redundant. The stomach, small bowel, and large bowel are otherwise unremarkable. The appendix is not clearly identified, however, there are no secondary signs of appendicitis within the pericecal region. No free intraperitoneal gas or fluid. Vascular/Lymphatic: Infrarenal abdominal aortic aneurysm has undergone endovascular repair. The maximal diameter of the a aneurysm sac is stable at 4.5 cm. Moderate atherosclerotic calcifications seen within the lower extremity arterial outflow. The abdominal vasculature is otherwise unremarkable. No pathologic adenopathy within the abdomen and pelvis. Reproductive: Prostate is unremarkable. Other: Rectum unremarkable Musculoskeletal: Degenerative changes are seen within the a lumbar spine. Osseous structures are  age-appropriate. No lytic or blastic bone lesions are seen. Review of the MIP images confirms the above findings. IMPRESSION: No pulmonary embolism. Morphologic changes of pulmonary arterial hypertension, elevated right heart pressures, and at least some degree of right heart failure. Stable 4.5 cm thoracic aortic aneurysm. Ascending thoracic aortic aneurysm. Recommend semi-annual imaging followup by CTA or MRA and referral to cardiothoracic surgery if not already obtained. This recommendation follows 2010 ACCF/AHA/AATS/ACR/ASA/SCA/SCAI/SIR/STS/SVM Guidelines for the Diagnosis and Management of  Patients With Thoracic Aortic Disease. Circulation. 2010; 121: E938-B017. Aortic aneurysm NOS (ICD10-I71.9) Patchy ground-glass pulmonary infiltrate within the left upper lobe, likely infectious or inflammatory. Acute cholecystitis. Stable infrarenal abdominal aortic aneurysm, status post endovascular repair, at 4.5 cm. 2.3 cm enhancing urothelial carcinoma within the right posterior bladder lumen in the region of the right ureterovesicular junction. No definite evidence of transmural extension or regional lymphadenopathy. Aortic aneurysm NOS (ICD10-I71.9). Aortic Atherosclerosis (ICD10-I70.0). Electronically Signed   By: [name] MD   On: 09/08/2020 03:09   IR Perc Cholecystostomy  Result Date: 09/08/2020 CLINICAL DATA:  Sepsis, hypotension, acute cholecystitis suspected on recent CT EXAM: PERCUTANEOUS CHOLECYSTOSTOMY TUBE PLACEMENT WITH ULTRASOUND AND FLUOROSCOPIC GUIDANCE FLUOROSCOPY TIME:  30 seconds; 6 mGy TECHNIQUE: The procedure, risks (including but not limited to bleeding, infection, organ damage ), benefits, and alternatives were explained to the patient. Questions regarding the procedure were encouraged and answered. The patient understands and consents to the procedure. Survey ultrasound of the abdomen was performed and an appropriate skin entry site was identified. Skin site was marked, prepped with  chlorhexidine, and draped in usual sterile fashion, and infiltrated locally with 1% lidocaine. Intravenous versed 1mg  were administered as conscious sedation during continuous monitoring of the patient's level of consciousness and physiological / cardiorespiratory status by the radiology RN, with a total moderate sedation time of 16 minutes. Under real-time ultrasound guidance, gallbladder was accessed using a lateral approach with a 21-gauge needle. Ultrasound image documentation was saved. Bile returned through the hub. Needle was exchanged over a 018 guidewire for transitional dilator which allowed placement of 035 J wire. Over this, a 10.2 French pigtail catheter was advanced and formed centrally in the gallbladder lumen. 20 mL dark green bile aspirated, sent for culture. Small contrast injection confirmed appropriate position. Catheter secured externally with 0 Prolene suture and placed external drain bag. Patient tolerated the procedure well. COMPLICATIONS: COMPLICATIONS none IMPRESSION: 1. Technically successful percutaneous cholecystostomy tube placement with ultrasound and fluoroscopic guidance. Electronically Signed   By: Lucrezia Europe M.D.   On: 09/08/2020 16:53   DG CHEST PORT 1 VIEW  Result Date: 09/08/2020 CLINICAL DATA:  Respiratory failure EXAM: PORTABLE CHEST 1 VIEW COMPARISON:  September 07, 2020 chest radiograph and chest CT September 08, 2020 FINDINGS: Central catheter tip is in the superior vena cava. No pneumothorax. Note that mandible obscures portions of each medial apex. There is interstitial pulmonary edema, most notably in the bases. There is no airspace consolidation. Heart is mildly enlarged with pulmonary venous hypertension. Status post median sternotomy. No adenopathy. No bone lesions. IMPRESSION: Central catheter tip in superior vena cava without pneumothorax. Mild cardiomegaly with a degree of pulmonary vascular congestion. There is a degree of interstitial edema. The overall  appearance is indicative of a degree of congestive heart failure. No airspace consolidation. Electronically Signed   By: Lowella Grip III M.D.   On: 09/08/2020 19:19   DG Chest Portable 1 View  Result Date: 09/08/2020 CLINICAL DATA:  Initial evaluation for acute shortness of breath. EXAM: PORTABLE CHEST 1 VIEW COMPARISON:  Prior radiograph from 12/21/2019. FINDINGS: Median sternotomy wires underlying CABG markers noted. Mild cardiomegaly, stable. Mediastinal silhouette within normal limits. Lungs are hypoinflated with probable underlying interstitial lung disease and/or fibrotic changes. No focal infiltrates or consolidative opacity. Mild perihilar vascular congestion without overt pulmonary edema. No pleural effusion. No pneumothorax. No acute osseous finding. IMPRESSION: 1. Shallow lung inflation with probable underlying interstitial lung disease and/or fibrotic changes. 2. Mild perihilar vascular congestion  without overt pulmonary edema. 3. No other active cardiopulmonary disease. Electronically Signed   By: Jeannine Boga M.D.   On: 09/08/2020 01:09   ECHOCARDIOGRAM COMPLETE  Result Date: 09/08/2020    ECHOCARDIOGRAM REPORT   Patient Name:   Vincent Robinson Date of Exam: 09/08/2020 Medical Rec #:  [id number]      Height:       67.0 in Accession #:    [id number]     Weight:       155.0 lb Date of Birth:  1923-06-15       BSA:          1.815 m Patient Age:    86 years       BP:           96/54 mmHg Patient Gender: M              HR:           59 bpm. Exam Location:  Inpatient Procedure: 2D Echo and Intracardiac Opacification Agent Indications:    Acute Respiratory Insufficiency 518.82 / R06.89  History:        Patient has prior history of Echocardiogram examinations, most                 recent 11/23/2019. CHF, Pulmonary HTN and COPD, Aortic Valve                 Disease; Risk Factors:Sleep Apnea, Former Smoker and                 Dyslipidemia. AAA s/p Thoracic Stent. Asthma. Hypothyroid.                  Chronic Hypoxic Respiratory Failure.  Sonographer:    [name] RDCS Referring Phys: [place]  1. Left ventricular ejection fraction, by estimation, is 55 to 60%. The left ventricle has normal function. There is moderate asymmetric left ventricular hypertrophy of the basal-septal segment. Left ventricular diastolic parameters are consistent with Grade II diastolic dysfunction (pseudonormalization). Elevated left atrial pressure. There is the interventricular septum is flattened in systole and diastole, consistent with right ventricular pressure and volume overload.  2. Right ventricular systolic function is moderately reduced. The right ventricular size is severely enlarged. There is severely elevated pulmonary artery systolic pressure. The estimated right ventricular systolic pressure is 00.7 mmHg.  3. Left atrial size was mildly dilated.  4. The mitral valve is normal in structure. Mild mitral valve regurgitation. No evidence of mitral stenosis.  5. Tricuspid valve regurgitation is moderate to severe.  6. The aortic valve is calcified. There is moderate calcification of the aortic valve. Aortic valve regurgitation is mild. Moderate aortic valve stenosis. Vmax 3.0 m/s, MG 20 mmHg, AVA 1.45 cm^2, DI 0.29  7. Aortic dilatation noted. There is mild dilatation of the ascending aorta, measuring 38 mm.  8. The inferior vena cava is dilated in size with <50% respiratory variability, suggesting right atrial pressure of 15 mmHg. FINDINGS  Left Ventricle: Left ventricular ejection fraction, by estimation, is 55 to 60%. The left ventricle has normal function. The left ventricle has no regional wall motion abnormalities. Definity contrast agent was given IV to delineate the left ventricular  endocardial borders. The left ventricular internal cavity size was normal in size. There is moderate asymmetric left ventricular hypertrophy of the basal-septal segment. The interventricular septum is  flattened in systole and diastole, consistent with right ventricular pressure and volume overload. Left ventricular diastolic parameters  are consistent with Grade II diastolic dysfunction (pseudonormalization). Elevated left atrial pressure. Right Ventricle: The right ventricular size is severely enlarged. Right vetricular wall thickness was not assessed. Right ventricular systolic function is moderately reduced. There is severely elevated pulmonary artery systolic pressure. The tricuspid regurgitant velocity is 4.08 m/s, and with an assumed right atrial pressure of 15 mmHg, the estimated right ventricular systolic pressure is 90.2 mmHg. Left Atrium: Left atrial size was mildly dilated. Right Atrium: Right atrial size was not well visualized. Pericardium: There is no evidence of pericardial effusion. Mitral Valve: The mitral valve is normal in structure. Mild mitral valve regurgitation. No evidence of mitral valve stenosis. Tricuspid Valve: The tricuspid valve is normal in structure. Tricuspid valve regurgitation is moderate to severe. Aortic Valve: The aortic valve is calcified. There is moderate calcification of the aortic valve. Aortic valve regurgitation is mild. Moderate aortic stenosis is present. Aortic valve mean gradient measures 16.2 mmHg. Aortic valve peak gradient measures 28.5 mmHg. Aortic valve area, by VTI measures 1.60 cm. Pulmonic Valve: The pulmonic valve was not well visualized. Pulmonic valve regurgitation is not visualized. Aorta: The aortic root is normal in size and structure and aortic dilatation noted. There is mild dilatation of the ascending aorta, measuring 38 mm. Venous: The inferior vena cava is dilated in size with less than 50% respiratory variability, suggesting right atrial pressure of 15 mmHg. IAS/Shunts: The interatrial septum was not well visualized.  LEFT VENTRICLE PLAX 2D LVIDd:         4.10 cm      Diastology LVIDs:         2.90 cm      LV e' medial:    7.27 cm/s LV PW:          1.30 cm      LV E/e' medial:  12.3 LV IVS:        1.50 cm      LV e' lateral:   9.46 cm/s LVOT diam:     2.45 cm      LV E/e' lateral: 9.4 LV SV:         94 LV SV Index:   52 LVOT Area:     4.71 cm  LV Volumes (MOD) LV vol d, MOD A2C: 98.7 ml LV vol d, MOD A4C: 103.0 ml LV vol s, MOD A2C: 42.0 ml LV vol s, MOD A4C: 64.6 ml LV SV MOD A2C:     56.7 ml LV SV MOD A4C:     103.0 ml LV SV MOD BP:      49.1 ml RIGHT VENTRICLE RV S prime:     7.45 cm/s TAPSE (M-mode): 1.6 cm LEFT ATRIUM             Index LA diam:        4.20 cm 2.31 cm/m LA Vol (A2C):   74.8 ml 41.22 ml/m LA Vol (A4C):   59.3 ml 32.68 ml/m LA Biplane Vol: 68.3 ml 37.64 ml/m  AORTIC VALVE AV Area (Vmax):    1.64 cm AV Area (Vmean):   1.70 cm AV Area (VTI):     1.60 cm AV Vmax:           267.00 cm/s AV Vmean:          186.500 cm/s AV VTI:            0.590 m AV Peak Grad:      28.5 mmHg AV Mean Grad:      16.2 mmHg  LVOT Vmax:         92.70 cm/s LVOT Vmean:        67.200 cm/s LVOT VTI:          0.200 m LVOT/AV VTI ratio: 0.34  AORTA Ao Root diam: 3.70 cm Ao Asc diam:  3.80 cm MITRAL VALVE               TRICUSPID VALVE MV Area (PHT): 5.84 cm    TR Peak grad:   66.6 mmHg MV Decel Time: 130 msec    TR Vmax:        408.00 cm/s MV E velocity: 89.10 cm/s MV A velocity: 61.70 cm/s  SHUNTS MV E/A ratio:  1.44        Systemic VTI:  0.20 m                            Systemic Diam: 2.45 cm Oswaldo Milian MD Electronically signed by Oswaldo Milian MD Signature Date/Time: 09/08/2020/7:27:55 PM    Final    [place] EKG SITE RITE  Result Date: 09/08/2020 If Site Rite image not attached, placement could not be confirmed due to current cardiac rhythm.   Anti-infectives: Anti-infectives (From admission, onward)   Start     Dose/Rate Route Frequency Ordered Stop   09/08/20 1400  piperacillin-tazobactam (ZOSYN) IVPB 3.375 g  Status:  Discontinued        3.375 g 100 mL/hr over 30 Minutes Intravenous Every 8 hours 09/08/20 1238 09/08/20 1313   09/08/20  1400  piperacillin-tazobactam (ZOSYN) IVPB 3.375 g        3.375 g 12.5 mL/hr over 240 Minutes Intravenous Every 8 hours 09/08/20 1313     09/08/20 1100  ceFEPIme (MAXIPIME) 2 g in sodium chloride 0.9 % 100 mL IVPB  Status:  Discontinued        2 g 200 mL/hr over 30 Minutes Intravenous Every 12 hours 09/08/20 0731 09/08/20 1238   09/08/20 0330  piperacillin-tazobactam (ZOSYN) IVPB 3.375 g        3.375 g 100 mL/hr over 30 Minutes Intravenous  Once 09/08/20 0323 09/08/20 0511   09/08/20 0000  ceFEPIme (MAXIPIME) 2 g in sodium chloride 0.9 % 100 mL IVPB        2 g 200 mL/hr over 30 Minutes Intravenous  Once 09/07/20 2353 09/08/20 0140   09/08/20 0000  vancomycin (VANCOREADY) IVPB 1500 mg/300 mL        1,500 mg 150 mL/hr over 120 Minutes Intravenous  Once 09/07/20 2353 09/08/20 0428       Assessment/Plan Right-sided HF Pulmonary HTN with cor pulmonale - IVFs stopped due to fluid overload CAD, s/p CABG x5 A Fib, not on AC Bladder cancer ILD on O2 at home Bacteremia - defer to primary team  Sepsis secondary to acute cholecystitis -LFTs trending down except his TB is up to 4.2 today.  If this continues to trend up, will need MRCP.  Hopefully this will plateau and start to trend down -s/p perc chole drain placement by IR 11/18 -still on levo, but BPs better than yesterday so far -regular diet as tolerates -cx from perc chole drain with gram + cocci, rods as well as gram - rods.  This is prelim -cont to follow closely   FEN - regular diet/levo VTE - lovenox ID - [email or web address]   LOS: 1 day    Henreitta Cea , [facility] Surgery 09/09/2020, 7:49 AM Please see  Amion for pager number during day hours 7:00am-4:30pm or 7:00am -11:30am on weekends

## 2020-09-09 NOTE — TOC Initial Note (Addendum)
Transition of Care [facility]) - Initial/Assessment Note    Patient Details  Name: Vincent Robinson MRN: [id number] Date of Birth: 03/23/1923  Transition of Care [facility]) CM/SW Contact:    [name], RN Phone Number: 09/09/2020, 8:16 AM  Clinical Narrative:                 Assessment/Plan Right-sided HF Pulmonary HTN with cor pulmonale - IVFs stopped due to fluid overload CAD, s/p CABG x5 A Fib, not on AC Bladder cancer ILD on O2 at home Bacteremia - defer to primary team  Sepsis secondary to acute cholecystitis -LFTs trending down except his TB is up to 4.2 today.  If this continues to trend up, will need MRCP.  Hopefully this will plateau and start to trend down -s/p perc chole drain placement by IR 11/18 -still on levo, but BPs better than yesterday so far -regular diet as tolerates -cx from perc chole drain with gram + cocci, rods as well as gram - rods.  This is prelim -cont to follow closely FEN -regular diet/levo VTE -lovenox ID -zosyn Patient is 86 years of age from home with wife.  Plan is to return to home Following for progression and toc needs.  iuv levophed for hypotension, bp ranging from 80/21-164/98.  Expected Discharge Plan: Home/Self Care Barriers to Discharge: No Barriers Identified   Patient Goals and CMS Choice Patient states their goals for this hospitalization and ongoing recovery are:: to go home CMS Medicare.gov Compare Post Acute Care list provided to:: Patient    Expected Discharge Plan and Services Expected Discharge Plan: Home/Self Care   Discharge Planning Services: CM Consult   Living arrangements for the past 2 months: Single Family Home                                      Prior Living Arrangements/Services Living arrangements for the past 2 months: Single Family Home Lives with:: Spouse Patient language and need for interpreter reviewed:: Yes Do you feel safe going back to the place where you live?: Yes      Need  for Family Participation in Patient Care: Yes (Comment) Care giver support system in place?: Yes (comment)   Criminal Activity/Legal Involvement Pertinent to Current Situation/Hospitalization: No - Comment as needed  Activities of Daily Living Home Assistive Devices/Equipment: BIPAP, Cane (specify quad or straight), Dentures (specify type), Walker (specify type), Other (Comment) (tub/shower unit, single point cane, walk-in shower, 2 wheeled walker, standard toilet) ADL Screening (condition at time of admission) Patient's cognitive ability adequate to safely complete daily activities?: Yes Is the patient deaf or have difficulty hearing?: No Does the patient have difficulty seeing, even when wearing glasses/contacts?: No Does the patient have difficulty concentrating, remembering, or making decisions?: No Patient able to express need for assistance with ADLs?: Yes Does the patient have difficulty dressing or bathing?: Yes Independently performs ADLs?: No Communication: Independent Dressing (OT): Needs assistance Is this a change from baseline?: Change from baseline, expected to last >3 days Grooming: Needs assistance Is this a change from baseline?: Change from baseline, expected to last >3 days Feeding: Independent Bathing: Needs assistance Is this a change from baseline?: Change from baseline, expected to last >3 days Toileting: Needs assistance Is this a change from baseline?: Change from baseline, expected to last >3days In/Out Bed: Needs assistance Is this a change from baseline?: Change from baseline, expected to last >3  days Walks in Home: Needs assistance Is this a change from baseline?: Change from baseline, expected to last >3 days Does the patient have difficulty walking or climbing stairs?: Yes (secondary to weakness and illness) Weakness of Legs: Both Weakness of Arms/Hands: None  Permission Sought/Granted                  Emotional Assessment Appearance:: Appears  stated age Attitude/Demeanor/Rapport: Engaged Affect (typically observed): Calm Orientation: : Oriented to Place, Oriented to Self, Oriented to  Time, Oriented to Situation Alcohol / Substance Use: Not Applicable Psych Involvement: Yes (comment)  Admission diagnosis:  Acute cholecystitis [K81.0] Septic shock ([place]) [A41.9, R65.21] Community acquired pneumonia of left upper lobe of lung [J18.9] Patient Active Problem List   Diagnosis Date Noted  . Acute cholecystitis 09/08/2020  . Diverticular hemorrhage   . Rectal bleeding 03/22/2020  . Acute GI bleeding 03/22/2020  . Hypokalemia   . Hypotension   . Acute respiratory failure with hypoxia ([place]) 12/18/2019  . Acute on chronic right-sided heart failure ([place]) 12/18/2019  . Pressure injury of skin 11/24/2019  . Chronic respiratory failure ([place])   . Acute on chronic respiratory failure ([place]) 11/23/2019  . Right heart failure ([place])   . S/P CABG (coronary artery bypass graft) 11/22/2019  . Chest pain 11/22/2019  . Atrial fibrillation ([place]) 08/04/2019  . Pulmonary hypertension, unspecified ([place]) 10/11/2017  . Aortic stenosis, moderate 10/11/2017  . Coronary artery disease 08/30/2017  . AAA (abdominal aortic aneurysm) without rupture ([place]) 08/30/2017  . Acute blood loss anemia 06/07/2017  . Hemorrhagic shock ([place]) 06/07/2017  . Lower GI bleed 06/05/2017  . Sleep apnea 06/05/2017  . Hypothyroidism 06/05/2017  . Hyperlipidemia 06/05/2017  . Asthma 06/05/2017  . ILD (interstitial lung disease) ([place]) 05/28/2017  . Pneumothorax on right 05/20/2017  . COPD (chronic obstructive pulmonary disease) ([place]) 05/20/2017  . Pneumothorax 05/20/2017  . Skin cancer 10/23/2015   PCP:  Katherina Mires, MD Pharmacy:   [id number] - [place], [address] [place] [address] Phone: [phone] Fax: [phone]     Social Determinants of Health (SDOH) Interventions    Readmission Risk  Interventions Readmission Risk Prevention Plan 03/25/2020 11/24/2019  Transportation Screening Complete Complete  PCP or Specialist Appt within 5-7 Days - Patient refused  Home Care Screening - Complete  Medication Review (RN CM) - Referral to Pharmacy  HRI or Home Care Consult Patient refused -  Palliative Care Screening Not Applicable -  Medication Review (RN Care Manager) Complete -  Some recent data might be hidden

## 2020-09-09 NOTE — Progress Notes (Signed)
NAME:  Vincent Robinson, MRN:  [id number], DOB:  Feb 19, 1923, LOS: 1 ADMISSION DATE:  09/07/2020, CONSULTATION DATE:  09/08/20 REFERRING MD:  Dr April Pulumbo, CHIEF COMPLAINT:  Septic shock from cholecystitis  Brief History   86 y/o M admitted 11/17 with abdominal pain.  Work up concerning for septic shock secondary to acute cholecystitis, acute hypoxic resp failure.   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 AF on monitor / EKG pending  Consults:    Procedures:    Significant Diagnostic Tests:  11/18 CT ABD/Pelvis w Contrast >> acute cholecystitis, 2.3 CM enhancing urothelial carcinoma within the right posterior bladder lumen in the region of the right ureterovesicular junction  11/18 CTA Chest 11/8 >> negative for PE, morphologic changes of pulmonary arterial hypertension, elevated right heart pressures, some degree of right heart failure, stable 4.5 cm thoracic aortic aneurysm, patchy ground-glass infiltrate in LUL 11/18 ECHO >> LVEF 55-60%, moderate asymetric LV hypertrophy of the basal-septal segment, grade II diastolic dysfunction, elevated LA pressure, interventricular septum is flattened in systole and diastole consistent with RV pressure overload, RV systolic function moderately reduced, RV severely enlarged, RV systolic pressure 81, moderate to severe TR  Micro Data:  COVID 11/18 >> negative  Influenza 11/18 >> negative BCx2 11/17 >> 1/2 with GPC's >>  U. Strep Antigen 11/18 >> negative  Legionella 11/18 >>  UC 11/18 >>  Gallbladder Culture 11/18 >> moderate GPC, few  GPR, rare GNR >>   Antimicrobials:  Vanco 11/17 >> 11/17 Cefepime 11/17 >> 11/18 Zosyn 11/18 >>   Interim history/subjective:  Pt reports feeling better, notes slight drainage from right eye  Remains on levophed 10 mcg, on midodrine  ? AF on monitor vs SR with PAC's > EKG pending  On 15L HFNC  I/O 520 ml UOP, +1.9L in last 24 hours   Objective   Blood pressure (!) 112/33, pulse (!) 50, temperature 98.4 F (36.9 C), temperature source Oral, resp. rate 17, height 5\' 7"  (1.702 m), weight 77 kg, SpO2 91 %.    FiO2 (%):  [95 %-100 %] 100 %   Intake/Output Summary (Last 24 hours) at 09/09/2020 0851 Last data filed at 09/09/2020 0536 Gross per 24 hour  Intake 2560.24 ml  Output 650 ml  Net 1910.24 ml   Filed Weights   09/08/20 0731 09/09/20 0500  Weight: 70.3 kg 77 kg    Examination: General: pleasant, elderly adult male lying in bed in NAD  HEENT: MM pink/moist, fair dentition, [place] O2, anicteric  Neuro: AAOx4, speech clear, MAE CV: s1s2 RRR, no m/r/g PULM: non-labored on [place] O2, lungs bilaterally with fine bibasilar crackles GI: soft, bsx4 active, GB drain in place with brown drainage  Extremities: warm/dry, no edema  Skin: no rashes or lesions  Resolved Hospital Problem list     Assessment & Plan:   Septic Shock in setting of suspected Acute Cholecystitis  On midodrine at home -continue levophed for MAP >65  -continue midodrine  -follow hemodynamics in ICU  -appreciate CCS / IR assistance  -follow cultures, legionella  Severe Pulmonary HTN  Emphysema  Post Inflammatory ILD  Acute on Chronic Hypoxic Respiratory Failure  OSA  -hold home macitentan, sildenafil, torsemide, spironolactone  -O2 to support sats >88% -continue bronchodilators  -CPAP QHS  D-CHF and Cor Pulmonale (sees Dr Jeffie Pollock) -follow troponin  -hold diuretics as above   Hypokalemia  -monitor, replace lytes as indicated    Suspected Urothelial Cancer (noted on CT in June 2021) -will need  Urology consult prior to discharge   Hypothyroidism  TSH 0.827 on admit  -continue synthroid   Best practice:  Diet: NPO but meds Pain/Anxiety/Delirium protocol (if indicated): n/a VAP protocol (if indicated): n/a DVT prophylaxis: heparin GI prophylaxis: PPI Glucose control: monitor glucose  Mobility: bed rest Code Status: full code   Family Communication: Patient updated on plan of care 11/19  Disposition: ICU   CC Time: 7 minutes     Noe Gens, MSN, NP-C, AGACNP-BC [facility] 09/09/2020, 8:51 AM   Please see [email or web address] for pager details.       LABS    PULMONARY No results for input(s): PHART, PCO2ART, PO2ART, HCO3, TCO2, O2SAT in the last 168 hours.  Invalid input(s): PCO2, PO2  CBC Recent Labs  Lab 09/07/20 2320 09/08/20 0740 09/09/20 0500  HGB 13.4 12.1* 12.1*  HCT 40.1 37.3* 37.9*  WBC 11.1* 12.6* 11.9*  PLT 245 232 204    COAGULATION Recent Labs  Lab 09/07/20 2355 09/08/20 0740  INR 1.1 1.3*    CARDIAC  No results for input(s): TROPONINI in the last 168 hours. No results for input(s): PROBNP in the last 168 hours.   CHEMISTRY Recent Labs  Lab 09/07/20 2320 09/07/20 2320 09/08/20 0011 09/08/20 0740 09/09/20 0500  NA 137  --   --  136 133*  K 3.3*   < >  --  3.6 3.8  CL 102  --   --  102 103  CO2 24  --   --  24 22  GLUCOSE 113*  --   --  129* 118*  BUN 26*  --   --  25* 23  CREATININE 1.14  --  1.20 1.19 0.97  CALCIUM 8.4*  --   --  7.9* 7.9*  MG  --   --   --  2.0 1.7  PHOS  --   --   --  4.2 3.8   < > = values in this interval not displayed.   Estimated Creatinine Clearance: 42.6 mL/min (by C-G formula based on SCr of 0.97 mg/dL).   LIVER Recent Labs  Lab 09/07/20 2320 09/07/20 2355 09/08/20 0740 09/09/20 0500  AST 608*  --  439* 147*  ALT 297*  --  278* 167*  ALKPHOS 227*  --  184* 113  BILITOT 2.7*  --  3.7* 4.2*  PROT 7.8  --  6.9 6.2*  ALBUMIN 3.8  --  3.2* 2.7*  INR  --  1.1  1.3*  --     INFECTIOUS Recent Labs  Lab 09/07/20 2355 09/08/20 0740 09/08/20 1043  LATICACIDVEN 1.5 1.3 2.1*  PROCALCITON  --  6.79  --     ENDOCRINE CBG (last 3)  Recent Labs    09/09/20 0008 09/09/20 0344 09/09/20 0757  GLUCAP 109* 122* 103*    IMAGING   CT Angio Chest PE W and/or Wo Contrast  Result Date: 09/08/2020 CLINICAL DATA:  Fever, chills, leukocytosis, thoracoabdominal aortic aneurysm, respiratory failure EXAM: CT ANGIOGRAPHY CHEST CT ABDOMEN AND PELVIS WITH CONTRAST TECHNIQUE:  Multidetector CT imaging of the chest was performed using the standard protocol during bolus administration of intravenous contrast. Multiplanar CT image reconstructions and MIPs were obtained to evaluate the vascular anatomy. Multidetector CT imaging of the abdomen and pelvis was performed using the standard protocol during bolus administration of intravenous contrast. CONTRAST:  121mL OMNIPAQUE IOHEXOL 350 MG/ML SOLN COMPARISON:  CTA chest 11/22/2019, CT abdomen pelvis 07/14/2020 the TI is FINDINGS: CTA CHEST FINDINGS Cardiovascular: There is excellent opacification of the pulmonary arterial tree. No intraluminal filling defect identified to suggest acute pulmonary embolism. Central pulmonary arteries are enlarged in keeping with changes of pulmonary arterial hypertension, unchanged from prior examination. Coronary artery bypass grafting has been performed. There is mild global cardiomegaly with particular enlargement of the right atrium and right ventricle. There is reflux of contrast into the hepatic caval junction in keeping with at least some degree of right heart failure. Extensive calcification of the a aortic valve leaflets. No pericardial effusion. The ascending aorta is dilated measuring 4.5 cm in greatest dimension, stable since prior examination. The aortic arch measures 3.1 cm in greatest dimension. The descending thoracic aorta at the level of the left atrium measures 2.8 cm in greatest  dimension. Mediastinum/Nodes: No pathologic thoracic adenopathy. Esophagus unremarkable. Lungs/Pleura: Imaging is slightly limited by motion artifact. There has developed progressive subpleural reticulation throughout the lungs likely representing mild fibrotic change. There is, however, more focal superimposed ground-glass pulmonary infiltrate within the left upper lobe, likely infectious or inflammatory in the acute setting. No pneumothorax or pleural effusion. Central airways are widely patent. Musculoskeletal: The osseous structures are age-appropriate. No acute bone abnormality peer Review of the MIP images confirms the above findings. CT ABDOMEN and PELVIS FINDINGS Hepatobiliary: The gallbladder is distended, there is marked gallbladder wall thickening, and extensive pericholecystic inflammatory stranding in keeping with changes of acute cholecystitis. Focal intrahepatic biliary ductal dilation within the inferior right hepatic lobe and lateral segment of the left hepatic lobe is unchanged from prior examination, possibly the sequela of remote trauma or inflammation. The liver is otherwise unremarkable. No extrahepatic biliary ductal dilation peer Pancreas: Unremarkable Spleen: Unremarkable Adrenals/Urinary Tract: The adrenal glands are unremarkable. The kidneys are normal. A 2.3 cm enhancing mural mass is again identified within the right posterior basal wall of the bladder compatible with a a urothelial carcinoma this appears similar to prior examination. The mass does not clearly demonstrate transmural extension at this time. The mass is in close proximity to the right ureterovesicular junction. The bladder is otherwise unremarkable. Stomach/Bowel: Extensive descending and sigmoid colonic diverticulosis. The sigmoid colon is redundant. The stomach, small bowel, and large bowel are otherwise unremarkable. The appendix is not clearly identified, however, there are no secondary signs of appendicitis within the  pericecal region. No free intraperitoneal gas or fluid. Vascular/Lymphatic: Infrarenal abdominal aortic aneurysm has undergone endovascular repair. The maximal diameter of the a aneurysm sac is stable at 4.5 cm. Moderate atherosclerotic calcifications seen within the lower extremity arterial outflow. The abdominal vasculature is otherwise unremarkable. No pathologic adenopathy within the abdomen and pelvis. Reproductive: Prostate is unremarkable. Other: Rectum unremarkable Musculoskeletal: Degenerative changes are seen within the a lumbar spine. Osseous structures are age-appropriate. No lytic or blastic bone lesions are seen. Review of the MIP images confirms the above findings. IMPRESSION: No pulmonary embolism. Morphologic changes of pulmonary arterial hypertension, elevated right heart pressures, and at least some degree of right heart failure. Stable 4.5 cm thoracic aortic aneurysm. Ascending thoracic aortic aneurysm. Recommend semi-annual imaging followup  by CTA or MRA and referral to cardiothoracic surgery if not already obtained. This recommendation follows 2010 ACCF/AHA/AATS/ACR/ASA/SCA/SCAI/SIR/STS/SVM Guidelines for the Diagnosis and Management of Patients With Thoracic Aortic Disease. Circulation. 2010; 121: P382-N053. Aortic aneurysm NOS (ICD10-I71.9) Patchy ground-glass pulmonary infiltrate within the left upper lobe, likely infectious or inflammatory. Acute cholecystitis. Stable infrarenal abdominal aortic aneurysm, status post endovascular repair, at 4.5 cm. 2.3 cm enhancing urothelial carcinoma within the right posterior bladder lumen in the region of the right ureterovesicular junction. No definite evidence of transmural extension or regional lymphadenopathy. Aortic aneurysm NOS (ICD10-I71.9). Aortic Atherosclerosis (ICD10-I70.0). Electronically Signed   By: [name] MD   On: 09/08/2020 03:09   CT ABDOMEN PELVIS W CONTRAST  Result Date: 09/08/2020 CLINICAL DATA:  Fever, chills,  leukocytosis, thoracoabdominal aortic aneurysm, respiratory failure EXAM: CT ANGIOGRAPHY CHEST CT ABDOMEN AND PELVIS WITH CONTRAST TECHNIQUE: Multidetector CT imaging of the chest was performed using the standard protocol during bolus administration of intravenous contrast. Multiplanar CT image reconstructions and MIPs were obtained to evaluate the vascular anatomy. Multidetector CT imaging of the abdomen and pelvis was performed using the standard protocol during bolus administration of intravenous contrast. CONTRAST:  12mL OMNIPAQUE IOHEXOL 350 MG/ML SOLN COMPARISON:  CTA chest 11/22/2019, CT abdomen pelvis 07/14/2020 the TI is FINDINGS: CTA CHEST FINDINGS Cardiovascular: There is excellent opacification of the pulmonary arterial tree. No intraluminal filling defect identified to suggest acute pulmonary embolism. Central pulmonary arteries are enlarged in keeping with changes of pulmonary arterial hypertension, unchanged from prior examination. Coronary artery bypass grafting has been performed. There is mild global cardiomegaly with particular enlargement of the right atrium and right ventricle. There is reflux of contrast into the hepatic caval junction in keeping with at least some degree of right heart failure. Extensive calcification of the a aortic valve leaflets. No pericardial effusion. The ascending aorta is dilated measuring 4.5 cm in greatest dimension, stable since prior examination. The aortic arch measures 3.1 cm in greatest dimension. The descending thoracic aorta at the level of the left atrium measures 2.8 cm in greatest dimension. Mediastinum/Nodes: No pathologic thoracic adenopathy. Esophagus unremarkable. Lungs/Pleura: Imaging is slightly limited by motion artifact. There has developed progressive subpleural reticulation throughout the lungs likely representing mild fibrotic change. There is, however, more focal superimposed ground-glass pulmonary infiltrate within the left upper lobe, likely  infectious or inflammatory in the acute setting. No pneumothorax or pleural effusion. Central airways are widely patent. Musculoskeletal: The osseous structures are age-appropriate. No acute bone abnormality peer Review of the MIP images confirms the above findings. CT ABDOMEN and PELVIS FINDINGS Hepatobiliary: The gallbladder is distended, there is marked gallbladder wall thickening, and extensive pericholecystic inflammatory stranding in keeping with changes of acute cholecystitis. Focal intrahepatic biliary ductal dilation within the inferior right hepatic lobe and lateral segment of the left hepatic lobe is unchanged from prior examination, possibly the sequela of remote trauma or inflammation. The liver is otherwise unremarkable. No extrahepatic biliary ductal dilation peer Pancreas: Unremarkable Spleen: Unremarkable Adrenals/Urinary Tract: The adrenal glands are unremarkable. The kidneys are normal. A 2.3 cm enhancing mural mass is again identified within the right posterior basal wall of the bladder compatible with a a urothelial carcinoma this appears similar to prior examination. The mass does not clearly demonstrate transmural extension at this time. The mass is in close proximity to the right ureterovesicular junction. The bladder is otherwise unremarkable. Stomach/Bowel: Extensive descending and sigmoid colonic diverticulosis. The sigmoid colon is redundant. The stomach, small bowel, and large bowel are  otherwise unremarkable. The appendix is not clearly identified, however, there are no secondary signs of appendicitis within the pericecal region. No free intraperitoneal gas or fluid. Vascular/Lymphatic: Infrarenal abdominal aortic aneurysm has undergone endovascular repair. The maximal diameter of the a aneurysm sac is stable at 4.5 cm. Moderate atherosclerotic calcifications seen within the lower extremity arterial outflow. The abdominal vasculature is otherwise unremarkable. No pathologic adenopathy  within the abdomen and pelvis. Reproductive: Prostate is unremarkable. Other: Rectum unremarkable Musculoskeletal: Degenerative changes are seen within the a lumbar spine. Osseous structures are age-appropriate. No lytic or blastic bone lesions are seen. Review of the MIP images confirms the above findings. IMPRESSION: No pulmonary embolism. Morphologic changes of pulmonary arterial hypertension, elevated right heart pressures, and at least some degree of right heart failure. Stable 4.5 cm thoracic aortic aneurysm. Ascending thoracic aortic aneurysm. Recommend semi-annual imaging followup by CTA or MRA and referral to cardiothoracic surgery if not already obtained. This recommendation follows 2010 ACCF/AHA/AATS/ACR/ASA/SCA/SCAI/SIR/STS/SVM Guidelines for the Diagnosis and Management of Patients With Thoracic Aortic Disease. Circulation. 2010; 121: P379-K240. Aortic aneurysm NOS (ICD10-I71.9) Patchy ground-glass pulmonary infiltrate within the left upper lobe, likely infectious or inflammatory. Acute cholecystitis. Stable infrarenal abdominal aortic aneurysm, status post endovascular repair, at 4.5 cm. 2.3 cm enhancing urothelial carcinoma within the right posterior bladder lumen in the region of the right ureterovesicular junction. No definite evidence of transmural extension or regional lymphadenopathy. Aortic aneurysm NOS (ICD10-I71.9). Aortic Atherosclerosis (ICD10-I70.0). Electronically Signed   By: [name] MD   On: 09/08/2020 03:09   IR Perc Cholecystostomy  Result Date: 09/08/2020 CLINICAL DATA:  Sepsis, hypotension, acute cholecystitis suspected on recent CT EXAM: PERCUTANEOUS CHOLECYSTOSTOMY TUBE PLACEMENT WITH ULTRASOUND AND FLUOROSCOPIC GUIDANCE FLUOROSCOPY TIME:  30 seconds; 6 mGy TECHNIQUE: The procedure, risks (including but not limited to bleeding, infection, organ damage ), benefits, and alternatives were explained to the patient. Questions regarding the procedure were encouraged and  answered. The patient understands and consents to the procedure. Survey ultrasound of the abdomen was performed and an appropriate skin entry site was identified. Skin site was marked, prepped with chlorhexidine, and draped in usual sterile fashion, and infiltrated locally with 1% lidocaine. Intravenous versed 1mg  were administered as conscious sedation during continuous monitoring of the patient's level of consciousness and physiological / cardiorespiratory status by the radiology RN, with a total moderate sedation time of 16 minutes. Under real-time ultrasound guidance, gallbladder was accessed using a lateral approach with a 21-gauge needle. Ultrasound image documentation was saved. Bile returned through the hub. Needle was exchanged over a 018 guidewire for transitional dilator which allowed placement of 035 J wire. Over this, a 10.2 French pigtail catheter was advanced and formed centrally in the gallbladder lumen. 20 mL dark green bile aspirated, sent for culture. Small contrast injection confirmed appropriate position. Catheter secured externally with 0 Prolene suture and placed external drain bag. Patient tolerated the procedure well. COMPLICATIONS: COMPLICATIONS none IMPRESSION: 1. Technically successful percutaneous cholecystostomy tube placement with ultrasound and fluoroscopic guidance. Electronically Signed   By: Lucrezia Europe M.D.   On: 09/08/2020 16:53   DG CHEST PORT 1 VIEW  Result Date: 09/08/2020 CLINICAL DATA:  Respiratory failure EXAM: PORTABLE CHEST 1 VIEW COMPARISON:  September 07, 2020 chest radiograph and chest CT September 08, 2020 FINDINGS: Central catheter tip is in the superior vena cava. No pneumothorax. Note that mandible obscures portions of each medial apex. There is interstitial pulmonary edema, most notably in the bases. There is no airspace consolidation. Heart is mildly  enlarged with pulmonary venous hypertension. Status post median sternotomy. No adenopathy. No bone lesions.  IMPRESSION: Central catheter tip in superior vena cava without pneumothorax. Mild cardiomegaly with a degree of pulmonary vascular congestion. There is a degree of interstitial edema. The overall appearance is indicative of a degree of congestive heart failure. No airspace consolidation. Electronically Signed   By: Lowella Grip III M.D.   On: 09/08/2020 19:19   DG Chest Portable 1 View  Result Date: 09/08/2020 CLINICAL DATA:  Initial evaluation for acute shortness of breath. EXAM: PORTABLE CHEST 1 VIEW COMPARISON:  Prior radiograph from 12/21/2019. FINDINGS: Median sternotomy wires underlying CABG markers noted. Mild cardiomegaly, stable. Mediastinal silhouette within normal limits. Lungs are hypoinflated with probable underlying interstitial lung disease and/or fibrotic changes. No focal infiltrates or consolidative opacity. Mild perihilar vascular congestion without overt pulmonary edema. No pleural effusion. No pneumothorax. No acute osseous finding. IMPRESSION: 1. Shallow lung inflation with probable underlying interstitial lung disease and/or fibrotic changes. 2. Mild perihilar vascular congestion without overt pulmonary edema. 3. No other active cardiopulmonary disease. Electronically Signed   By: Jeannine Boga M.D.   On: 09/08/2020 01:09   ECHOCARDIOGRAM COMPLETE  Result Date: 09/08/2020    ECHOCARDIOGRAM REPORT   Patient Name:   WILLETT LEFEBER Date of Exam: 09/08/2020 Medical Rec #:  [id number]      Height:       67.0 in Accession #:    [id number]     Weight:       155.0 lb Date of Birth:  07/07/1923       BSA:          1.815 m Patient Age:    89 years       BP:           96/54 mmHg Patient Gender: M              HR:           59 bpm. Exam Location:  Inpatient Procedure: 2D Echo and Intracardiac Opacification Agent Indications:    Acute Respiratory Insufficiency 518.82 / R06.89  History:        Patient has prior history of Echocardiogram examinations, most                 recent 11/23/2019.  CHF, Pulmonary HTN and COPD, Aortic Valve                 Disease; Risk Factors:Sleep Apnea, Former Smoker and                 Dyslipidemia. AAA s/p Thoracic Stent. Asthma. Hypothyroid.                 Chronic Hypoxic Respiratory Failure.  Sonographer:    [name] RDCS Referring Phys: [place]  1. Left ventricular ejection fraction, by estimation, is 55 to 60%. The left ventricle has normal function. There is moderate asymmetric left ventricular hypertrophy of the basal-septal segment. Left ventricular diastolic parameters are consistent with Grade II diastolic dysfunction (pseudonormalization). Elevated left atrial pressure. There is the interventricular septum is flattened in systole and diastole, consistent with right ventricular pressure and volume overload.  2. Right ventricular systolic function is moderately reduced. The right ventricular size is severely enlarged. There is severely elevated pulmonary artery systolic pressure. The estimated right ventricular systolic pressure is 46.6 mmHg.  3. Left atrial size was mildly dilated.  4. The mitral valve is normal in structure. Mild mitral valve  regurgitation. No evidence of mitral stenosis.  5. Tricuspid valve regurgitation is moderate to severe.  6. The aortic valve is calcified. There is moderate calcification of the aortic valve. Aortic valve regurgitation is mild. Moderate aortic valve stenosis. Vmax 3.0 m/s, MG 20 mmHg, AVA 1.45 cm^2, DI 0.29  7. Aortic dilatation noted. There is mild dilatation of the ascending aorta, measuring 38 mm.  8. The inferior vena cava is dilated in size with <50% respiratory variability, suggesting right atrial pressure of 15 mmHg. FINDINGS  Left Ventricle: Left ventricular ejection fraction, by estimation, is 55 to 60%. The left ventricle has normal function. The left ventricle has no regional wall motion abnormalities. Definity contrast agent was given IV to delineate the left ventricular   endocardial borders. The left ventricular internal cavity size was normal in size. There is moderate asymmetric left ventricular hypertrophy of the basal-septal segment. The interventricular septum is flattened in systole and diastole, consistent with right ventricular pressure and volume overload. Left ventricular diastolic parameters are consistent with Grade II diastolic dysfunction (pseudonormalization). Elevated left atrial pressure. Right Ventricle: The right ventricular size is severely enlarged. Right vetricular wall thickness was not assessed. Right ventricular systolic function is moderately reduced. There is severely elevated pulmonary artery systolic pressure. The tricuspid regurgitant velocity is 4.08 m/s, and with an assumed right atrial pressure of 15 mmHg, the estimated right ventricular systolic pressure is 81.2 mmHg. Left Atrium: Left atrial size was mildly dilated. Right Atrium: Right atrial size was not well visualized. Pericardium: There is no evidence of pericardial effusion. Mitral Valve: The mitral valve is normal in structure. Mild mitral valve regurgitation. No evidence of mitral valve stenosis. Tricuspid Valve: The tricuspid valve is normal in structure. Tricuspid valve regurgitation is moderate to severe. Aortic Valve: The aortic valve is calcified. There is moderate calcification of the aortic valve. Aortic valve regurgitation is mild. Moderate aortic stenosis is present. Aortic valve mean gradient measures 16.2 mmHg. Aortic valve peak gradient measures 28.5 mmHg. Aortic valve area, by VTI measures 1.60 cm. Pulmonic Valve: The pulmonic valve was not well visualized. Pulmonic valve regurgitation is not visualized. Aorta: The aortic root is normal in size and structure and aortic dilatation noted. There is mild dilatation of the ascending aorta, measuring 38 mm. Venous: The inferior vena cava is dilated in size with less than 50% respiratory variability, suggesting right atrial pressure  of 15 mmHg. IAS/Shunts: The interatrial septum was not well visualized.  LEFT VENTRICLE PLAX 2D LVIDd:         4.10 cm      Diastology LVIDs:         2.90 cm      LV e' medial:    7.27 cm/s LV PW:         1.30 cm      LV E/e' medial:  12.3 LV IVS:        1.50 cm      LV e' lateral:   9.46 cm/s LVOT diam:     2.45 cm      LV E/e' lateral: 9.4 LV SV:         94 LV SV Index:   52 LVOT Area:     4.71 cm  LV Volumes (MOD) LV vol d, MOD A2C: 98.7 ml LV vol d, MOD A4C: 103.0 ml LV vol s, MOD A2C: 42.0 ml LV vol s, MOD A4C: 64.6 ml LV SV MOD A2C:     56.7 ml LV SV MOD A4C:  103.0 ml LV SV MOD BP:      49.1 ml RIGHT VENTRICLE RV S prime:     7.45 cm/s TAPSE (M-mode): 1.6 cm LEFT ATRIUM             Index LA diam:        4.20 cm 2.31 cm/m LA Vol (A2C):   74.8 ml 41.22 ml/m LA Vol (A4C):   59.3 ml 32.68 ml/m LA Biplane Vol: 68.3 ml 37.64 ml/m  AORTIC VALVE AV Area (Vmax):    1.64 cm AV Area (Vmean):   1.70 cm AV Area (VTI):     1.60 cm AV Vmax:           267.00 cm/s AV Vmean:          186.500 cm/s AV VTI:            0.590 m AV Peak Grad:      28.5 mmHg AV Mean Grad:      16.2 mmHg LVOT Vmax:         92.70 cm/s LVOT Vmean:        67.200 cm/s LVOT VTI:          0.200 m LVOT/AV VTI ratio: 0.34  AORTA Ao Root diam: 3.70 cm Ao Asc diam:  3.80 cm MITRAL VALVE               TRICUSPID VALVE MV Area (PHT): 5.84 cm    TR Peak grad:   66.6 mmHg MV Decel Time: 130 msec    TR Vmax:        408.00 cm/s MV E velocity: 89.10 cm/s MV A velocity: 61.70 cm/s  SHUNTS MV E/A ratio:  1.44        Systemic VTI:  0.20 m                            Systemic Diam: 2.45 cm Oswaldo Milian MD Electronically signed by Oswaldo Milian MD Signature Date/Time: 09/08/2020/7:27:55 PM    Final    [place] EKG SITE RITE  Result Date: 09/08/2020 If Site Rite image not attached, placement could not be confirmed due to current cardiac rhythm.

## 2020-09-09 NOTE — Progress Notes (Signed)
PHARMACY - PHYSICIAN COMMUNICATION CRITICAL VALUE ALERT - BLOOD CULTURE IDENTIFICATION (BCID)  UNNAMED ZEIEN is an 86 y.o. male who presented to [facility] on 09/07/2020 with a chief complaint of cholecystitis  Name of physician (or Provider) Contacted: Ogan  Current antibiotics: zosyn  Changes to prescribed antibiotics recommended:  none  Results for orders placed or performed during the hospital encounter of 09/07/20  Blood Culture ID Panel (Reflexed) (Collected: 09/07/2020 11:55 PM)  Result Value Ref Range   Enterococcus faecalis NOT DETECTED NOT DETECTED   Enterococcus Faecium NOT DETECTED NOT DETECTED   Listeria monocytogenes NOT DETECTED NOT DETECTED   Staphylococcus species DETECTED (A) NOT DETECTED   Staphylococcus aureus (BCID) NOT DETECTED NOT DETECTED   Staphylococcus epidermidis NOT DETECTED NOT DETECTED   Staphylococcus lugdunensis DETECTED (A) NOT DETECTED   Streptococcus species NOT DETECTED NOT DETECTED   Streptococcus agalactiae NOT DETECTED NOT DETECTED   Streptococcus pneumoniae NOT DETECTED NOT DETECTED   Streptococcus pyogenes NOT DETECTED NOT DETECTED   A.calcoaceticus-baumannii NOT DETECTED NOT DETECTED   Bacteroides fragilis NOT DETECTED NOT DETECTED   Enterobacterales NOT DETECTED NOT DETECTED   Enterobacter cloacae complex NOT DETECTED NOT DETECTED   Escherichia coli NOT DETECTED NOT DETECTED   Klebsiella aerogenes NOT DETECTED NOT DETECTED   Klebsiella oxytoca NOT DETECTED NOT DETECTED   Klebsiella pneumoniae NOT DETECTED NOT DETECTED   Proteus species NOT DETECTED NOT DETECTED   Salmonella species NOT DETECTED NOT DETECTED   Serratia marcescens NOT DETECTED NOT DETECTED   Haemophilus influenzae NOT DETECTED NOT DETECTED   Neisseria meningitidis NOT DETECTED NOT DETECTED   Pseudomonas aeruginosa NOT DETECTED NOT DETECTED   Stenotrophomonas maltophilia NOT DETECTED NOT DETECTED   Candida albicans NOT DETECTED NOT DETECTED   Candida auris NOT  DETECTED NOT DETECTED   Candida glabrata NOT DETECTED NOT DETECTED   Candida krusei NOT DETECTED NOT DETECTED   Candida parapsilosis NOT DETECTED NOT DETECTED   Candida tropicalis NOT DETECTED NOT DETECTED   Cryptococcus neoformans/gattii NOT DETECTED NOT DETECTED   Methicillin resistance mecA/C DETECTED (A) NOT DETECTED    Dolly Rias RPh 09/09/2020, 2:30 AM

## 2020-09-10 DIAGNOSIS — I272 Pulmonary hypertension, unspecified: Secondary | ICD-10-CM

## 2020-09-10 DIAGNOSIS — I5032 Chronic diastolic (congestive) heart failure: Secondary | ICD-10-CM

## 2020-09-10 DIAGNOSIS — I2781 Cor pulmonale (chronic): Secondary | ICD-10-CM | POA: Diagnosis not present

## 2020-09-10 DIAGNOSIS — J9621 Acute and chronic respiratory failure with hypoxia: Secondary | ICD-10-CM

## 2020-09-10 DIAGNOSIS — K810 Acute cholecystitis: Secondary | ICD-10-CM | POA: Diagnosis not present

## 2020-09-10 DIAGNOSIS — A419 Sepsis, unspecified organism: Secondary | ICD-10-CM | POA: Diagnosis not present

## 2020-09-10 LAB — URINE CULTURE: Culture: 3000 — AB

## 2020-09-10 LAB — COMPREHENSIVE METABOLIC PANEL
ALT: 108 U/L — ABNORMAL HIGH (ref 0–44)
AST: 64 U/L — ABNORMAL HIGH (ref 15–41)
Albumin: 2.4 g/dL — ABNORMAL LOW (ref 3.5–5.0)
Alkaline Phosphatase: 82 U/L (ref 38–126)
Anion gap: 9 (ref 5–15)
BUN: 24 mg/dL — ABNORMAL HIGH (ref 8–23)
CO2: 24 mmol/L (ref 22–32)
Calcium: 7.8 mg/dL — ABNORMAL LOW (ref 8.9–10.3)
Chloride: 102 mmol/L (ref 98–111)
Creatinine, Ser: 1.11 mg/dL (ref 0.61–1.24)
GFR, Estimated: >60 mL/min (ref >60)
Glucose, Bld: 107 mg/dL — ABNORMAL HIGH (ref 70–99)
Potassium: 3.2 mmol/L — ABNORMAL LOW (ref 3.5–5.1)
Sodium: 135 mmol/L (ref 135–145)
Total Bilirubin: 3 mg/dL — ABNORMAL HIGH (ref 0.3–1.2)
Total Protein: 5.7 g/dL — ABNORMAL LOW (ref 6.5–8.1)

## 2020-09-10 LAB — GLUCOSE, CAPILLARY
Glucose-Capillary: 97 mg/dL (ref 70–99)
Glucose-Capillary: 102 mg/dL — ABNORMAL HIGH (ref 70–99)
Glucose-Capillary: 84 mg/dL (ref 70–99)
Glucose-Capillary: 139 mg/dL — ABNORMAL HIGH (ref 70–99)
Glucose-Capillary: 92 mg/dL (ref 70–99)

## 2020-09-10 LAB — CBC
HCT: 34.4 % — ABNORMAL LOW (ref 39.0–52.0)
Hemoglobin: 11 g/dL — ABNORMAL LOW (ref 13.0–17.0)
MCH: 31.9 pg (ref 26.0–34.0)
MCHC: 32 g/dL (ref 30.0–36.0)
MCV: 99.7 fL (ref 80.0–100.0)
Platelets: 163 10*3/uL (ref 150–400)
RBC: 3.45 MIL/uL — ABNORMAL LOW (ref 4.22–5.81)
RDW: 15.3 % (ref 11.5–15.5)
WBC: 6.7 10*3/uL (ref 4.0–10.5)
nRBC: 0 % (ref 0.0–0.2)

## 2020-09-10 LAB — LEGIONELLA PNEUMOPHILA SEROGP 1 UR AG: L. pneumophila Serogp 1 Ur Ag: NEGATIVE

## 2020-09-10 LAB — CULTURE, BLOOD (ROUTINE X 2)

## 2020-09-10 MED ORDER — POTASSIUM CHLORIDE CRYS ER 20 MEQ PO TBCR
40.0000 meq | EXTENDED_RELEASE_TABLET | Freq: Once | ORAL | Status: AC
  Administered 2020-09-10: 40 meq via ORAL
  Filled 2020-09-10: qty 2

## 2020-09-10 MED ORDER — INSULIN ASPART 100 UNIT/ML ~~LOC~~ SOLN: 0.0000 [IU] | Freq: Three times a day (TID) | SUBCUTANEOUS | Status: DC

## 2020-09-10 MED ORDER — IPRATROPIUM-ALBUTEROL 0.5-2.5 (3) MG/3ML IN SOLN
3.0000 mL | Freq: Two times a day (BID) | RESPIRATORY_TRACT | Status: DC
  Administered 2020-09-10 – 2020-09-19 (×18): 3 mL via RESPIRATORY_TRACT
  Filled 2020-09-10 (×18): qty 3

## 2020-09-10 MED ORDER — TORSEMIDE 20 MG PO TABS
40.0000 mg | ORAL_TABLET | Freq: Every day | ORAL | Status: DC
  Administered 2020-09-10 – 2020-09-14 (×5): 40 mg via ORAL
  Filled 2020-09-10 (×5): qty 2

## 2020-09-10 MED ORDER — SILDENAFIL CITRATE 20 MG PO TABS
20.0000 mg | ORAL_TABLET | Freq: Three times a day (TID) | ORAL | Status: DC
  Administered 2020-09-10 – 2020-09-14 (×12): 20 mg via ORAL
  Filled 2020-09-10 (×13): qty 1

## 2020-09-10 NOTE — Evaluation (Signed)
Physical Therapy Evaluation Patient Details Name: Vincent Robinson MRN: [id number] DOB: 1923-01-12 Today's Date: 09/10/2020   History of Present Illness  86 year old with asthma, ILD, pulmonary hypertension, OSA, CABG Admitted 09/07/20  with septic shock, acute cholecystitis. S/p perc chole drain placement by IR 11/18  Clinical Impression  The patient resting in bed on 10 L HFNC, SPO2 88%. Patient asking to wait to get up this PM. Encouraged patient that mobilizing will speed his progress. Patient assisted  To sitting on bed side. Patient did require steadying assistance and use  Of RW for standing and taking a few steps to recliner. SPO2 87% with activity. HR 81.  Patient resides in a modular home , alone as  His wife is in [facility], for cancer treatments. Patient states  Daughter will  Be available as needed.  Pt admitted with above diagnosis.  Pt currently with functional limitations due to the deficits listed below (see PT Problem List). Pt will benefit from skilled PT to increase their independence and safety with mobility to allow discharge to the venue listed below.      Follow Up Recommendations Home health PT;Supervision/Assistance - 24 hour vs  SNF(depends on caregivers and progress)    Equipment Recommendations  None recommended by PT    Recommendations for Other Services OT consult     Precautions / Restrictions Precautions Precautions: Fall Precaution Comments: R flank drain Restrictions Weight Bearing Restrictions: No      Mobility  Bed Mobility Overal bed mobility: Needs Assistance Bed Mobility: Supine to Sit     Supine to sit: Min assist     General bed mobility comments: gentle HHA to sit upright    Transfers Overall transfer level: Needs assistance Equipment used: Rolling walker (2 wheeled) Transfers: Sit to/from [facility] Sit to Stand: Min assist Stand pivot transfers: Min assist       General transfer comment: steady  assistance to stand from bed x 2, lost balance and sat down, then stood up.Encouraged use of RW to steady for transfer to rcliner. Marched in place x 10 prior to sitting down.  Ambulation/Gait             General Gait Details: tba  Stairs            Wheelchair Mobility    Modified Rankin (Stroke Patients Only)       Balance Overall balance assessment: Needs assistance Sitting-balance support: Feet supported;Bilateral upper extremity supported Sitting balance-Leahy Scale: Good     Standing balance support: During functional activity;No upper extremity supported Standing balance-Leahy Scale: Poor Standing balance comment: steadied self on RW with improved balance                             Pertinent Vitals/Pain Pain Assessment: No/denies pain    Home Living Family/patient expects to be discharged to:: Private residence Living Arrangements: Alone;Spouse/significant other   Type of Home:  (modular pod) Home Access: Level entry     Home Layout: One level Home Equipment: Walker - 4 wheels Additional Comments: wife is in [employer] x 2 mos with cancer, daughter local and available to assist per pt.    Prior Function Level of Independence: Independent               Hand Dominance        Extremity/Trunk Assessment   Upper Extremity Assessment Upper Extremity Assessment: Generalized weakness    Lower Extremity  Assessment Lower Extremity Assessment: Generalized weakness    Cervical / Trunk Assessment Cervical / Trunk Assessment: Kyphotic  Communication   Communication: No difficulties;HOH  Cognition Arousal/Alertness: Awake/alert Behavior During Therapy: WFL for tasks assessed/performed Overall Cognitive Status: Within Functional Limits for tasks assessed                                        General Comments      Exercises     Assessment/Plan    PT Assessment Patient needs continued PT services  PT  Problem List Decreased strength;Decreased knowledge of use of DME;Decreased activity tolerance;Decreased balance;Decreased knowledge of precautions;Decreased mobility       PT Treatment Interventions DME instruction;Gait training;Functional mobility training;Patient/family education;Therapeutic activities;Therapeutic exercise    PT Goals (Current goals can be found in the Care Plan section)  Acute Rehab PT Goals Patient Stated Goal: to go home. visit his wife PT Goal Formulation: With patient Time For Goal Achievement: 09/24/20 Potential to Achieve Goals: Good    Frequency Min 3X/week   Barriers to discharge        Co-evaluation               AM-PAC PT "6 Clicks" Mobility  Outcome Measure Help needed turning from your back to your side while in a flat bed without using bedrails?: A Little Help needed moving from lying on your back to sitting on the side of a flat bed without using bedrails?: A Little Help needed moving to and from a bed to a chair (including a wheelchair)?: A Little Help needed standing up from a chair using your arms (e.g., wheelchair or bedside chair)?: A Little Help needed to walk in hospital room?: A Lot Help needed climbing 3-5 steps with a railing? : A Lot 6 Click Score: 16    End of Session Equipment Utilized During Treatment: Oxygen Activity Tolerance: Patient tolerated treatment well Patient left: in chair;with call bell/phone within reach Nurse Communication: Mobility status PT Visit Diagnosis: Unsteadiness on feet (R26.81);Difficulty in walking, not elsewhere classified (R26.2)    Time: 6270-3500 PT Time Calculation (min) (ACUTE ONLY): 31 min   Charges:   PT Evaluation $PT Eval Low Complexity: 1 Low PT Treatments $Therapeutic Activity: 8-22 mins        Tresa Endo PT Acute Rehabilitation Services Pager [phone] Office [phone]   Claretha Cooper 09/10/2020, 9:06 AM

## 2020-09-10 NOTE — Progress Notes (Signed)
NAME:  Vincent Robinson, MRN:  [id number], DOB:  04-26-1923, LOS: 2 ADMISSION DATE:  09/07/2020, CONSULTATION DATE:  09/08/20 REFERRING MD:  Dr April Pulumbo, CHIEF COMPLAINT:  Septic shock from cholecystitis  Brief History   86 y/o M admitted 11/17 with abdominal pain.  Work up concerning for septic shock secondary to acute cholecystitis, acute hypoxic resp failure.   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 AF on monitor / EKG pending  Consults:  Surgery IR  Procedures:  Perc bili drain  Significant Diagnostic Tests:  11/18 CT ABD/Pelvis w Contrast >> acute cholecystitis, 2.3 CM enhancing urothelial carcinoma within the right posterior bladder lumen in the region of the right ureterovesicular junction  11/18 CTA Chest 11/8 >> negative for PE, morphologic changes of pulmonary arterial hypertension, elevated right heart pressures, some degree of right heart failure, stable 4.5 cm thoracic aortic aneurysm, patchy ground-glass infiltrate in LUL 11/18 ECHO >> LVEF 55-60%, moderate asymetric LV hypertrophy of the basal-septal segment, grade II diastolic dysfunction, elevated LA pressure, interventricular septum is flattened in systole and diastole consistent with RV pressure overload, RV systolic function moderately reduced, RV severely enlarged, RV systolic pressure 81, moderate to severe TR  Micro Data:  COVID 11/18 >> negative  Influenza 11/18 >> negative BCx2 11/17 >> 1/2 with GPC's >> Staph hominis and staph lugdunesis U. Strep Antigen 11/18 >> negative  Legionella 11/18 >>    UC 11/18 >>  Gallbladder Culture 11/18 >> moderate GPC, few GPR, rare GNR >> Enterococcus faecalis, enterobacter aerogenes  Antimicrobials:  Vanco 11/17 >> 11/17 Cefepime 11/17 >> 11/18 Zosyn 11/18 >>   Interim history/subjective:  Off norepi this morning. He feels well and is glad to be down to 11L O2 but wants to go back to his home 2L. Was able to get up to chair with PT.  Objective   Blood pressure (!) 157/100, pulse (!) 57, temperature 97.7 F (36.5 C), temperature source Axillary, resp. rate 19, height 5\' 7"  (1.702 m), weight 75.7 kg, SpO2 96 %.        Intake/Output Summary (Last 24 hours) at 09/10/2020 1556 Last data filed at 09/10/2020 1457 Gross per 24 hour  Intake 673.57 ml  Output 1020 ml  Net -346.43 ml   Filed Weights   09/08/20 0731 09/09/20 0500 09/10/20 0500  Weight: 70.3 kg 77 kg 75.7 kg    Examination: General: elderly man sitting up in the chair watching TV in NAD HEENT: [place]/AT, eyes anicteric, oral mucosa moist.  Neuro: awake and alert, answering questions appropriately, moving all extremities CV: S1S2, irreg rhythm, regular rate. Sinus rhythm with PVCs on tele. PULM: non-labored on [place] O2, lungs bilaterally with fine bibasilar crackles GI: soft, NT, cholecystostomy drain in place draining bilious nonpurulent drainage. Extremities: warm/dry, no edema  Skin: no rashes or lesions  Resolved Hospital Problem list     Assessment & Plan:   Sepsis due to acute cholecystitis due to Enterococcus and Enterobacter; shock resolved On midodrine PTA -weaned off pressors this morning; con't to monitor in the ICU while  restarting pulmonary vasodilators -con't middrine -appreciate IR & surgery's assistance. Will need OP surgery follow up. -con't perc bili drain -repeat blood cultures to ensure no persistent of staph species, which were likely contaminants.  Severe Pulmonary HTN - chronic;  Emphysema  Post Inflammatory ILD  Acute on Chronic Hypoxic Respiratory  Failure  OSA on CPAP -hold home macitentan, sildenafil, torsemide >> restarting low dose sildenafil today. -verified with pharmacy-> has not been on spironolactone since May; will remove from PTA med list -asking family to bring macitentan tomorrow to start if he can tolerate it hemodynamically -supplemental O2 to support sats >88% -con't bronchodilators -CPAP QHS -Restarting PH meds slowly; may be significantly affecting his oxygen requirements acutely, which should improve as he restarts pulm vasodilators  Chronic HFpEF and Cor Pulmonale (sees Dr Jeffie Pollock as an outpatient) -restarting torsemide  Hypokalemia  -monitor, replaced today   Suspected Urothelial Cancer (noted on CT in June 2021) -will need Urology consult prior to discharge   Hypothyroidism  TSH 0.827 on admit  -continue synthroid   Mild hyperglycemia -accuchecks TIDAC  Chronic anemia; currently Hb above baseline -con't to monitor -transfuse for Hb <7 or hemodynamically significant bleeding  Best practice:  Diet: regular diet Pain/Anxiety/Delirium protocol (if indicated): n/a VAP protocol (if indicated): n/a DVT prophylaxis: heparin GI prophylaxis: PPI Glucose control: monitor glucose  Mobility: bed rest Code Status: full code   Family Communication: Patient updated on plan of care 11/20 Disposition: ICU  LABS    PULMONARY No results for input(s): PHART, PCO2ART, PO2ART, HCO3, TCO2, O2SAT in the last 168 hours.  Invalid input(s): PCO2, PO2  CBC Recent Labs  Lab 09/08/20 0740 09/09/20 0500 09/10/20 0500  HGB 12.1* 12.1* 11.0*  HCT 37.3* 37.9* 34.4*  WBC 12.6* 11.9* 6.7  PLT 232 204 163    COAGULATION Recent Labs  Lab 09/07/20 2355 09/08/20 0740  INR 1.1 1.3*    CARDIAC  No results for input(s): TROPONINI in the last 168 hours. No results for input(s): PROBNP in the last 168 hours.   CHEMISTRY Recent Labs  Lab 09/07/20 2320 09/07/20 2320 09/08/20 0011 09/08/20 0740  09/08/20 0740 09/09/20 0500 09/10/20 0500  NA 137  --   --  136  --  133* 135  K 3.3*   < >  --  3.6   < > 3.8 3.2*  CL 102  --   --  102  --  103 102  CO2 24  --   --  24  --  22 24  GLUCOSE 113*  --   --  129*  --  118* 107*  BUN 26*  --   --  25*  --  23 24*  CREATININE 1.14  --  1.20 1.19  --  0.97 1.11  CALCIUM 8.4*  --   --  7.9*  --  7.9* 7.8*  MG  --   --   --  2.0  --  1.7  --   PHOS  --   --   --  4.2  --  3.8  --    < > = values in this interval not displayed.   Estimated Creatinine Clearance: 37.2 mL/min (by C-G formula based on SCr of 1.11 mg/dL).   LIVER Recent Labs  Lab 09/07/20 2320 09/07/20 2355 09/08/20 0740 09/09/20 0500 09/10/20 0500  AST 608*  --  439* 147* 64*  ALT 297*  --  278* 167* 108*  ALKPHOS 227*  --  184* 113 82  BILITOT 2.7*  --  3.7* 4.2* 3.0*  PROT 7.8  --  6.9 6.2* 5.7*  ALBUMIN 3.8  --  3.2* 2.7* 2.4*  INR  --  1.1 1.3*  --   --     INFECTIOUS Recent Labs  Lab 09/07/20 2355 09/08/20 0740 09/08/20 1043  LATICACIDVEN 1.5 1.3 2.1*  PROCALCITON  --  6.79  --     ENDOCRINE CBG (last 3)  Recent Labs    09/10/20 0329 09/10/20 0803 09/10/20 1139  GLUCAP 102* 84 139*      This patient is critically ill with multiple organ system failure which requires frequent high complexity decision making, assessment, support, evaluation, and titration of therapies. This was completed through the application of advanced monitoring technologies and extensive interpretation of multiple databases. During this encounter critical care time was devoted to patient care services described in this note for 40 minutes.  Julian Hy, DO 09/10/20 4:29 PM [facility]

## 2020-09-10 NOTE — Progress Notes (Signed)
Subjective/Chief Complaint: Feels well, no ab pain, no n/v, having flatus   Objective: Vital signs in last 24 hours: Temp:  [97.5 F (36.4 C)-98.5 F (36.9 C)] 98.4 F (36.9 C) (11/20 0350) Pulse Rate:  [37-80] 63 (11/20 0730) Resp:  [14-34] 22 (11/20 0730) BP: (84-150)/(36-81) 104/48 (11/20 0730) SpO2:  [78 %-97 %] 90 % (11/20 0746) Weight:  [75.7 kg] 75.7 kg (11/20 0500) Last BM Date: 09/09/20  Intake/Output from previous day: 11/19 0701 - 11/20 0700 In: 1675.1 [P.O.:1080; I.V.:436.9; IV Piggyback:153.2] Out: 1145 [Urine:770; Drains:375] Intake/Output this shift: No intake/output data recorded.  GI: soft nontender nondistended perc chole in place draining  Lab Results:  Recent Labs    09/09/20 0500 09/10/20 0500  WBC 11.9* 6.7  HGB 12.1* 11.0*  HCT 37.9* 34.4*  PLT 204 163   BMET Recent Labs    09/09/20 0500 09/10/20 0500  NA 133* 135  K 3.8 3.2*  CL 103 102  CO2 22 24  GLUCOSE 118* 107*  BUN 23 24*  CREATININE 0.97 1.11  CALCIUM 7.9* 7.8*   PT/INR Recent Labs    09/07/20 2355 09/08/20 0740  LABPROT 14.0 15.2  INR 1.1 1.3*   ABG No results for input(s): PHART, HCO3 in the last 72 hours.  Invalid input(s): PCO2, PO2  Studies/Results: IR Perc Cholecystostomy  Result Date: 09/08/2020 CLINICAL DATA:  Sepsis, hypotension, acute cholecystitis suspected on recent CT EXAM: PERCUTANEOUS CHOLECYSTOSTOMY TUBE PLACEMENT WITH ULTRASOUND AND FLUOROSCOPIC GUIDANCE FLUOROSCOPY TIME:  30 seconds; 6 mGy TECHNIQUE: The procedure, risks (including but not limited to bleeding, infection, organ damage ), benefits, and alternatives were explained to the patient. Questions regarding the procedure were encouraged and answered. The patient understands and consents to the procedure. Survey ultrasound of the abdomen was performed and an appropriate skin entry site was identified. Skin site was marked, prepped with chlorhexidine, and draped in usual sterile fashion, and  infiltrated locally with 1% lidocaine. Intravenous versed 1mg  were administered as conscious sedation during continuous monitoring of the patient's level of consciousness and physiological / cardiorespiratory status by the radiology RN, with a total moderate sedation time of 16 minutes. Under real-time ultrasound guidance, gallbladder was accessed using a lateral approach with a 21-gauge needle. Ultrasound image documentation was saved. Bile returned through the hub. Needle was exchanged over a 018 guidewire for transitional dilator which allowed placement of 035 J wire. Over this, a 10.2 French pigtail catheter was advanced and formed centrally in the gallbladder lumen. 20 mL dark green bile aspirated, sent for culture. Small contrast injection confirmed appropriate position. Catheter secured externally with 0 Prolene suture and placed external drain bag. Patient tolerated the procedure well. COMPLICATIONS: COMPLICATIONS none IMPRESSION: 1. Technically successful percutaneous cholecystostomy tube placement with ultrasound and fluoroscopic guidance. Electronically Signed   By: Lucrezia Europe M.D.   On: 09/08/2020 16:53   DG CHEST PORT 1 VIEW  Result Date: 09/08/2020 CLINICAL DATA:  Respiratory failure EXAM: PORTABLE CHEST 1 VIEW COMPARISON:  September 07, 2020 chest radiograph and chest CT September 08, 2020 FINDINGS: Central catheter tip is in the superior vena cava. No pneumothorax. Note that mandible obscures portions of each medial apex. There is interstitial pulmonary edema, most notably in the bases. There is no airspace consolidation. Heart is mildly enlarged with pulmonary venous hypertension. Status post median sternotomy. No adenopathy. No bone lesions. IMPRESSION: Central catheter tip in superior vena cava without pneumothorax. Mild cardiomegaly with a degree of pulmonary vascular congestion. There is a degree of  interstitial edema. The overall appearance is indicative of a degree of congestive heart  failure. No airspace consolidation. Electronically Signed   By: Lowella Grip III M.D.   On: 09/08/2020 19:19   ECHOCARDIOGRAM COMPLETE  Result Date: 09/08/2020    ECHOCARDIOGRAM REPORT   Patient Name:   Vincent Robinson Date of Exam: 09/08/2020 Medical Rec #:  [id number]      Height:       67.0 in Accession #:    [id number]     Weight:       155.0 lb Date of Birth:  [birth year]       BSA:          1.815 m Patient Age:    86 years       BP:           96/54 mmHg Patient Gender: M              HR:           59 bpm. Exam Location:  Inpatient Procedure: 2D Echo and Intracardiac Opacification Agent Indications:    Acute Respiratory Insufficiency 518.82 / R06.89  History:        Patient has prior history of Echocardiogram examinations, most                 recent 11/23/2019. CHF, Pulmonary HTN and COPD, Aortic Valve                 Disease; Risk Factors:Sleep Apnea, Former Smoker and                 Dyslipidemia. AAA s/p Thoracic Stent. Asthma. Hypothyroid.                 Chronic Hypoxic Respiratory Failure.  Sonographer:    [name] RDCS Referring Phys: [place]  1. Left ventricular ejection fraction, by estimation, is 55 to 60%. The left ventricle has normal function. There is moderate asymmetric left ventricular hypertrophy of the basal-septal segment. Left ventricular diastolic parameters are consistent with Grade II diastolic dysfunction (pseudonormalization). Elevated left atrial pressure. There is the interventricular septum is flattened in systole and diastole, consistent with right ventricular pressure and volume overload.  2. Right ventricular systolic function is moderately reduced. The right ventricular size is severely enlarged. There is severely elevated pulmonary artery systolic pressure. The estimated right ventricular systolic pressure is 88.5 mmHg.  3. Left atrial size was mildly dilated.  4. The mitral valve is normal in structure. Mild mitral valve regurgitation. No  evidence of mitral stenosis.  5. Tricuspid valve regurgitation is moderate to severe.  6. The aortic valve is calcified. There is moderate calcification of the aortic valve. Aortic valve regurgitation is mild. Moderate aortic valve stenosis. Vmax 3.0 m/s, MG 20 mmHg, AVA 1.45 cm^2, DI 0.29  7. Aortic dilatation noted. There is mild dilatation of the ascending aorta, measuring 38 mm.  8. The inferior vena cava is dilated in size with <50% respiratory variability, suggesting right atrial pressure of 15 mmHg. FINDINGS  Left Ventricle: Left ventricular ejection fraction, by estimation, is 55 to 60%. The left ventricle has normal function. The left ventricle has no regional wall motion abnormalities. Definity contrast agent was given IV to delineate the left ventricular  endocardial borders. The left ventricular internal cavity size was normal in size. There is moderate asymmetric left ventricular hypertrophy of the basal-septal segment. The interventricular septum is flattened in systole and diastole, consistent with right ventricular  pressure and volume overload. Left ventricular diastolic parameters are consistent with Grade II diastolic dysfunction (pseudonormalization). Elevated left atrial pressure. Right Ventricle: The right ventricular size is severely enlarged. Right vetricular wall thickness was not assessed. Right ventricular systolic function is moderately reduced. There is severely elevated pulmonary artery systolic pressure. The tricuspid regurgitant velocity is 4.08 m/s, and with an assumed right atrial pressure of 15 mmHg, the estimated right ventricular systolic pressure is 56.3 mmHg. Left Atrium: Left atrial size was mildly dilated. Right Atrium: Right atrial size was not well visualized. Pericardium: There is no evidence of pericardial effusion. Mitral Valve: The mitral valve is normal in structure. Mild mitral valve regurgitation. No evidence of mitral valve stenosis. Tricuspid Valve: The tricuspid  valve is normal in structure. Tricuspid valve regurgitation is moderate to severe. Aortic Valve: The aortic valve is calcified. There is moderate calcification of the aortic valve. Aortic valve regurgitation is mild. Moderate aortic stenosis is present. Aortic valve mean gradient measures 16.2 mmHg. Aortic valve peak gradient measures 28.5 mmHg. Aortic valve area, by VTI measures 1.60 cm. Pulmonic Valve: The pulmonic valve was not well visualized. Pulmonic valve regurgitation is not visualized. Aorta: The aortic root is normal in size and structure and aortic dilatation noted. There is mild dilatation of the ascending aorta, measuring 38 mm. Venous: The inferior vena cava is dilated in size with less than 50% respiratory variability, suggesting right atrial pressure of 15 mmHg. IAS/Shunts: The interatrial septum was not well visualized.  LEFT VENTRICLE PLAX 2D LVIDd:         4.10 cm      Diastology LVIDs:         2.90 cm      LV e' medial:    7.27 cm/s LV PW:         1.30 cm      LV E/e' medial:  12.3 LV IVS:        1.50 cm      LV e' lateral:   9.46 cm/s LVOT diam:     2.45 cm      LV E/e' lateral: 9.4 LV SV:         94 LV SV Index:   52 LVOT Area:     4.71 cm  LV Volumes (MOD) LV vol d, MOD A2C: 98.7 ml LV vol d, MOD A4C: 103.0 ml LV vol s, MOD A2C: 42.0 ml LV vol s, MOD A4C: 64.6 ml LV SV MOD A2C:     56.7 ml LV SV MOD A4C:     103.0 ml LV SV MOD BP:      49.1 ml RIGHT VENTRICLE RV S prime:     7.45 cm/s TAPSE (M-mode): 1.6 cm LEFT ATRIUM             Index LA diam:        4.20 cm 2.31 cm/m LA Vol (A2C):   74.8 ml 41.22 ml/m LA Vol (A4C):   59.3 ml 32.68 ml/m LA Biplane Vol: 68.3 ml 37.64 ml/m  AORTIC VALVE AV Area (Vmax):    1.64 cm AV Area (Vmean):   1.70 cm AV Area (VTI):     1.60 cm AV Vmax:           267.00 cm/s AV Vmean:          186.500 cm/s AV VTI:            0.590 m AV Peak Grad:      28.5 mmHg AV Mean  Grad:      16.2 mmHg LVOT Vmax:         92.70 cm/s LVOT Vmean:        67.200 cm/s LVOT VTI:           0.200 m LVOT/AV VTI ratio: 0.34  AORTA Ao Root diam: 3.70 cm Ao Asc diam:  3.80 cm MITRAL VALVE               TRICUSPID VALVE MV Area (PHT): 5.84 cm    TR Peak grad:   66.6 mmHg MV Decel Time: 130 msec    TR Vmax:        408.00 cm/s MV E velocity: 89.10 cm/s MV A velocity: 61.70 cm/s  SHUNTS MV E/A ratio:  1.44        Systemic VTI:  0.20 m                            Systemic Diam: 2.45 cm Oswaldo Milian MD Electronically signed by Oswaldo Milian MD Signature Date/Time: 09/08/2020/7:27:55 PM    Final    [place] EKG SITE RITE  Result Date: 09/08/2020 If Site Rite image not attached, placement could not be confirmed due to current cardiac rhythm.   Anti-infectives: Anti-infectives (From admission, onward)   Start     Dose/Rate Route Frequency Ordered Stop   09/08/20 1400  piperacillin-tazobactam (ZOSYN) IVPB 3.375 g  Status:  Discontinued        3.375 g 100 mL/hr over 30 Minutes Intravenous Every 8 hours 09/08/20 1238 09/08/20 1313   09/08/20 1400  piperacillin-tazobactam (ZOSYN) IVPB 3.375 g        3.375 g 12.5 mL/hr over 240 Minutes Intravenous Every 8 hours 09/08/20 1313     09/08/20 1100  ceFEPIme (MAXIPIME) 2 g in sodium chloride 0.9 % 100 mL IVPB  Status:  Discontinued        2 g 200 mL/hr over 30 Minutes Intravenous Every 12 hours 09/08/20 0731 09/08/20 1238   09/08/20 0330  piperacillin-tazobactam (ZOSYN) IVPB 3.375 g        3.375 g 100 mL/hr over 30 Minutes Intravenous  Once 09/08/20 0323 09/08/20 0511   09/08/20 0000  ceFEPIme (MAXIPIME) 2 g in sodium chloride 0.9 % 100 mL IVPB        2 g 200 mL/hr over 30 Minutes Intravenous  Once 09/07/20 2353 09/08/20 0140   09/08/20 0000  vancomycin (VANCOREADY) IVPB 1500 mg/300 mL        1,500 mg 150 mL/hr over 120 Minutes Intravenous  Once 09/07/20 2353 09/08/20 0428      Assessment/Plan:  Sepsis secondary to acute cholecystitis -LFTs trending down will recheck in am, no need for imaging -s/p perc chole drain placement by  IR 11/18 -regular diet as tolerates -cx from perc chole drain with gram + cocci, rods as well as gram - rods -cont to follow closely -will follow up Dr Kae Heller eventually  FEN -regular diet/ VTE -lovenox ID -zosyn   Right-sided HF Pulmonary HTN with cor pulmonale  CAD, s/p CABG x5 A Fib, not on [facility] Bladder cancer ILD on O2 at home Bacteremia - defer to primary team  Rolm Bookbinder 09/10/2020

## 2020-09-10 NOTE — Progress Notes (Signed)
Patient could not tolerate our CPAP mask. Patient stated that he would get his daughter to bring his home mask

## 2020-09-11 DIAGNOSIS — A419 Sepsis, unspecified organism: Secondary | ICD-10-CM | POA: Diagnosis not present

## 2020-09-11 DIAGNOSIS — K8309 Other cholangitis: Secondary | ICD-10-CM

## 2020-09-11 DIAGNOSIS — K810 Acute cholecystitis: Secondary | ICD-10-CM | POA: Diagnosis not present

## 2020-09-11 DIAGNOSIS — I50812 Chronic right heart failure: Secondary | ICD-10-CM | POA: Diagnosis not present

## 2020-09-11 DIAGNOSIS — I272 Pulmonary hypertension, unspecified: Secondary | ICD-10-CM | POA: Diagnosis not present

## 2020-09-11 LAB — GLUCOSE, CAPILLARY
Glucose-Capillary: 95 mg/dL (ref 70–99)
Glucose-Capillary: 100 mg/dL — ABNORMAL HIGH (ref 70–99)
Glucose-Capillary: 83 mg/dL (ref 70–99)

## 2020-09-11 MED ORDER — POTASSIUM CHLORIDE CRYS ER 20 MEQ PO TBCR
20.0000 meq | EXTENDED_RELEASE_TABLET | Freq: Once | ORAL | Status: AC
  Administered 2020-09-11: 20 meq via ORAL
  Filled 2020-09-11: qty 1

## 2020-09-11 NOTE — Progress Notes (Signed)
NAME:  Vincent Robinson, MRN:  [id number], DOB:  [birth year], LOS: 3 ADMISSION DATE:  09/07/2020, CONSULTATION DATE:  09/08/20 REFERRING MD:  Dr April Pulumbo, CHIEF COMPLAINT:  Septic shock from cholecystitis  Brief History   86 y/o M admitted 11/17 with abdominal pain.  Work up concerning for septic shock secondary to acute cholecystitis, acute hypoxic resp failure.   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 AF on monitor / EKG pending  Consults:  Surgery IR  Procedures:  Perc bili drain  Significant Diagnostic Tests:  11/18 CT ABD/Pelvis w Contrast >> acute cholecystitis, 2.3 CM enhancing urothelial carcinoma within the right posterior bladder lumen in the region of the right ureterovesicular junction  11/18 CTA Chest 11/8 >> negative for PE, morphologic changes of pulmonary arterial hypertension, elevated right heart pressures, some degree of right heart failure, stable 4.5 cm thoracic aortic aneurysm, patchy ground-glass infiltrate in LUL 11/18 ECHO >> LVEF 55-60%, moderate asymetric LV hypertrophy of the basal-septal segment, grade II diastolic dysfunction, elevated LA pressure, interventricular septum is flattened in systole and diastole consistent with RV pressure overload, RV systolic function moderately reduced, RV severely enlarged, RV systolic pressure 81, moderate to severe TR  Micro Data:  COVID 11/18 >> negative  Influenza 11/18 >> negative BCx2 11/17 >> 1/2 with GPC's >> Staph hominis and staph lugdunesis U. Strep Antigen 11/18 >> negative  Legionella 11/18 >>    UC 11/18 >>  Gallbladder Culture 11/18 >> moderate GPC, few GPR, rare GNR >> Enterococcus faecalis, enterobacter aerogenes Blood culture 11/21>  Antimicrobials:  Vanco 11/17 >> 11/17 Cefepime 11/17 >> 11/18 Zosyn 11/18 >>   Interim history/subjective:  He denies complaints this morning. No SOB. No abdominal pain.  Objective   Blood pressure (!) 109/38, pulse (!) 54, temperature 97.9 F (36.6 C), temperature source Axillary, resp. rate 15, height 5\' 7"  (1.702 m), weight 74.6 kg, SpO2 97 %.    FiO2 (%):  [95 %] 95 %   Intake/Output Summary (Last 24 hours) at 09/11/2020 1158 Last data filed at 09/11/2020 1120 Gross per 24 hour  Intake 527.85 ml  Output 1825 ml  Net -1297.15 ml   Filed Weights   09/09/20 0500 09/10/20 0500 09/11/20 0416  Weight: 77 kg 75.7 kg 74.6 kg    Examination: General: frail appearing elderly man sitting up in the chair in NAD HEENT: [place]/AT, eyes anicteric Neuro: Awake and alert, answering questions appropriately.  Moving all extremities. CV: S1-S2, irregular rhythm, regular rate. PULM: Breathing comfortably on 10 L nasal cannula.  CTAB anteriorly GI: Percutaneous biliary drain with thin green output, abdomen soft and nontender Extremities: Mild pedal edema, no cyanosis Skin: No rashes  Resolved Hospital Problem list     Assessment & Plan:   Sepsis due to acute cholecystitis due to Enterococcus and Enterobacter; shock resolved On midodrine PTA -Remains off vasopressors.  Soft blood pressures with low-dose sildenafil being restarted. -con't middrine -appreciate IR & surgery's assistance. Will need OP surgery follow up. -con't perc bili drain -repeat blood cultures drawn this morning to ensure  no persistent of staph species, which were likely contaminants.  Severe Pulmonary HTN - chronic;  Emphysema  Post Inflammatory ILD  Acute on Chronic Hypoxic Respiratory Failure  OSA on CPAP -Continue to hold hold home macitentan.  Okay to continue  reduced dose sildenafil 3 times daily at daily torsemide. -supplemental O2 to support sats >88% -con't bronchodilators -CPAP QHS -Patient requires ongoing ICU care due to concern for hypotension as we restart pulmonary vasodilators.  Chronic HFpEF and Cor Pulmonale (sees Dr Jeffie Pollock as an outpatient) -Continue torsemide  Hypokalemia  -low-dose repletion -Continue to monitor tomorrow   Suspected Urothelial Cancer (noted on CT in June 2021) -will need Urology consult prior to discharge   Hypothyroidism  TSH 0.827 on admit  -continue synthroid   Mild hyperglycemia -accuchecks TIDAC  Chronic anemia; currently Hb above baseline -con't to monitor -transfuse for Hb <7 or hemodynamically significant bleeding  Best practice:  Diet: regular diet Pain/Anxiety/Delirium protocol (if indicated): n/a VAP protocol (if indicated): n/a DVT prophylaxis: heparin GI prophylaxis: PPI Glucose control: monitor glucose  Mobility: bed rest Code Status: full code   Family Communication: Patient updated on plan of care 11/21 Disposition: ICU  LABS    PULMONARY No results for input(s): PHART, PCO2ART, PO2ART, HCO3, TCO2, O2SAT in the last 168 hours.  Invalid input(s): PCO2, PO2  CBC Recent Labs  Lab 09/08/20 0740 09/09/20 0500 09/10/20 0500  HGB 12.1* 12.1* 11.0*  HCT 37.3* 37.9* 34.4*  WBC 12.6* 11.9* 6.7  PLT 232 204 163    COAGULATION Recent Labs  Lab 09/07/20 2355 09/08/20 0740  INR 1.1 1.3*    CARDIAC  No results for input(s): TROPONINI in the last 168 hours. No results for input(s): PROBNP in the last 168 hours.   CHEMISTRY Recent Labs  Lab 09/07/20 2320 09/07/20 2320 09/08/20 0011 09/08/20 0740 09/08/20 0740 09/09/20 0500 09/10/20 0500  NA 137  --   --  136  --  133* 135  K 3.3*   < >  --  3.6   < > 3.8 3.2*  CL 102  --   --  102  --  103 102  CO2 24  --   --  24  --  22 24  GLUCOSE 113*  --   --  129*  --  118* 107*  BUN 26*  --   --  25*  --  23 24*   CREATININE 1.14  --  1.20 1.19  --  0.97 1.11  CALCIUM 8.4*  --   --  7.9*  --  7.9* 7.8*  MG  --   --   --  2.0  --  1.7  --   PHOS  --   --   --  4.2  --  3.8  --    < > = values in this interval not displayed.   Estimated Creatinine Clearance: 37.2 mL/min (by C-G formula based on SCr of 1.11 mg/dL).   LIVER Recent Labs  Lab 09/07/20 2320 09/07/20 2355 09/08/20 0740 09/09/20 0500 09/10/20 0500  AST 608*  --  439* 147* 64*  ALT 297*  --  278* 167* 108*  ALKPHOS 227*  --  184* 113 82  BILITOT 2.7*  --  3.7* 4.2* 3.0*  PROT 7.8  --  6.9 6.2* 5.7*  ALBUMIN 3.8  --  3.2* 2.7* 2.4*  INR  --  1.1 1.3*  --   --     INFECTIOUS Recent Labs  Lab 09/07/20  2355 09/08/20 0740 09/08/20 1043  LATICACIDVEN 1.5 1.3 2.1*  PROCALCITON  --  6.79  --     ENDOCRINE CBG (last 3)  Recent Labs    09/10/20 1139 09/10/20 1657 09/11/20 0801  GLUCAP 139* 92 95      This patient is critically ill with multiple organ system failure which requires frequent high complexity decision making, assessment, support, evaluation, and titration of therapies. This was completed through the application of advanced monitoring technologies and extensive interpretation of multiple databases. During this encounter critical care time was devoted to patient care services described in this note for 40 minutes.  Julian Hy, DO 09/11/20 11:58 AM [facility]

## 2020-09-11 NOTE — Progress Notes (Signed)
[facility] ([facility] Community Based Palliative [facility]   °    °This patient is enrolled in our palliative care services in the community.  [facility] will continue to follow for any discharge planning needs and to coordinate continuation of palliative care.   °If you have questions or need assistance, please call [phone] or contact the hospital Liaison listed on AMION.    ° °Thank you for the opportunity to participate in this patient’s care. °    °Chrislyn King, BSN, RN °[facility] Hospital Liaison   °[phone] (24h on call) ° °

## 2020-09-11 NOTE — Evaluation (Signed)
Occupational Therapy Evaluation Patient Details Name: Vincent Robinson MRN: [id number] DOB: July 18, 1923 Today's Date: 09/11/2020    History of Present Illness 86 year old with asthma, ILD, pulmonary hypertension, OSA, CABG Admitted 09/07/20  with septic shock, acute cholecystitis. S/p perc chole drain placement by IR 11/18   Clinical Impression   Vincent Robinson is a 86 year old man normally independent who presents with above pertinent medical history. On evaluation he demonstrates normal ROM and strength of upper extremities, transferred to sitting with supervision and min guard for standing and short ambulation with RW. Patient on 10 L HFNC and vital signs stayed [facility] during evaluation and treatment. Mild complaints of dizziness with sitting. Patient predominantly limited by lines and leads and urinary frequency - limiting further ambulation and self care tasks out of sitting. Patient set up for UB ADLs, min assist for LB bathing and mod assist for LB dressing.Patient will benefit from skilled OT services while in hospital to improve deficits and learn compensatory strategies as needed in order to return home at discharge. Expect patient to recover well in order to return home. Would recommend 24/7 assistance at least initially for safety at home.    Follow Up Recommendations  Home health OT;Supervision/Assistance - 24 hour    Equipment Recommendations  None recommended by OT    Recommendations for Other Services       Precautions / Restrictions Precautions Precautions: Fall Precaution Comments: R flank drain Restrictions Weight Bearing Restrictions: No      Mobility Bed Mobility Overal bed mobility: Needs Assistance Bed Mobility: Supine to Sit     Supine to sit: Supervision     General bed mobility comments: use of bedrail and increased time    Transfers Overall transfer level: Needs assistance Equipment used: Rolling walker (2 wheeled) Transfers: Sit to/from [employer] Sit to Stand: Min guard Stand pivot transfers: Min guard       General transfer comment: Sit to stand min guard with RW, able to walk approx 5 feet with Rw and min guard forward and then back up to recliner. No loss of balance. Activity and distance limited by lines/leads/urininary incontinence    Balance Overall balance assessment: Mild deficits observed, not formally tested                                         ADL either performed or assessed with clinical judgement   ADL Overall ADL's : Needs assistance/impaired Eating/Feeding: Set up;Sitting   Grooming: Set up;Sitting   Upper Body Bathing: Set up;Sitting   Lower Body Bathing: Sitting/lateral leans;Minimal assistance   Upper Body Dressing : Set up;Sitting   Lower Body Dressing: Moderate assistance;Sit to/from stand;Set up   Toilet Transfer: Min guard;Ambulation;RW;BSC   Toileting- Water quality scientist and Hygiene: Min guard;Sit to/from stand       Functional mobility during ADLs: Min guard;Rolling walker       Vision Patient Visual Report: No change from baseline       Perception     Praxis      Pertinent Vitals/Pain Pain Assessment: No/denies pain     Hand Dominance Right   Extremity/Trunk Assessment Upper Extremity Assessment Upper Extremity Assessment: RUE deficits/detail;LUE deficits/detail RUE Deficits / Details: normal ROM and strength RUE Sensation: WNL RUE Coordination: WNL LUE Deficits / Details: normal ROM and strength LUE Sensation: WNL LUE Coordination: WNL   Lower Extremity Assessment  Lower Extremity Assessment: Defer to PT evaluation   Cervical / Trunk Assessment Cervical / Trunk Assessment: Kyphotic   Communication Communication Communication: No difficulties;HOH   Cognition Arousal/Alertness: Awake/alert Behavior During Therapy: WFL for tasks assessed/performed Overall Cognitive Status: Within Functional Limits for tasks assessed                                      General Comments       Exercises     Shoulder Instructions      Home Living Family/patient expects to be discharged to:: Private residence Living Arrangements: Alone;Spouse/significant other Available Help at Discharge: Available PRN/intermittently;Family Type of Home:  (modular pod) Home Access: Level entry     Home Layout: One level     Bathroom Shower/Tub: Walk-in shower;Tub/shower unit   Bathroom Toilet: Standard Bathroom Accessibility: Yes   Home Equipment: Walker - 4 wheels   Additional Comments: wife is in [employer] x 2 mos with cancer      Prior Functioning/Environment Level of Independence: Independent with assistive device(s)        Comments: Reports he has a walker, uses it intermittently. Independent with ADLs - increased difficulty with donning socks/compression hose.        OT Problem List: Decreased activity tolerance;Impaired balance (sitting and/or standing);Cardiopulmonary status limiting activity      OT Treatment/Interventions: Self-care/ADL training;Therapeutic exercise;Therapeutic activities;Patient/family education;Balance training    OT Goals(Current goals can be found in the care plan section) Acute Rehab OT Goals Patient Stated Goal: to go home. visit his wife OT Goal Formulation: With patient Time For Goal Achievement: 09/25/20 Potential to Achieve Goals: Good  OT Frequency: Min 2X/week   Barriers to D/C:            Co-evaluation              AM-PAC OT "6 Clicks" Daily Activity     Outcome Measure Help from another person eating meals?: A Little Help from another person taking care of personal grooming?: A Little Help from another person toileting, which includes using toliet, bedpan, or urinal?: A Little Help from another person bathing (including washing, rinsing, drying)?: A Little Help from another person to put on and taking off regular upper body clothing?: A  Little Help from another person to put on and taking off regular lower body clothing?: A Lot 6 Click Score: 17   End of Session Equipment Utilized During Treatment: Gait belt;Rolling walker;Oxygen Nurse Communication: Mobility status  Activity Tolerance: Patient tolerated treatment well Patient left: in chair;with call bell/phone within reach;with chair alarm set  OT Visit Diagnosis: Unsteadiness on feet (R26.81)                Time: 0086-7619 OT Time Calculation (min): 30 min Charges:  OT General Charges $OT Visit: 1 Visit OT Evaluation $OT Eval Moderate Complexity: 1 Mod OT Treatments $Therapeutic Activity: 8-22 mins  Rashaun Wichert, OTR/[facility]  Office [phone] Pager: [place] 09/11/2020, 1:00 PM

## 2020-09-11 NOTE — Progress Notes (Signed)
° °  Subjective/Chief Complaint: No complaints, some flatus no bm, on oxygen, pressors off   Objective: Vital signs in last 24 hours: Temp:  [97.3 F (36.3 C)-98 F (36.7 C)] 98 F (36.7 C) (11/21 0000) Pulse Rate:  [40-131] 53 (11/21 0500) Resp:  [13-27] 17 (11/21 0500) BP: (86-163)/(36-103) 92/36 (11/21 0500) SpO2:  [88 %-100 %] 98 % (11/21 0500) FiO2 (%):  [95 %] 95 % (11/20 2038) Weight:  [74.6 kg] 74.6 kg (11/21 0416) Last BM Date: 09/09/20  Intake/Output from previous day: 11/20 0701 - 11/21 0700 In: 529.1 [P.O.:240; I.V.:143; IV Piggyback:146.2] Out: 1825 [Urine:1450; Drains:375] Intake/Output this shift: No intake/output data recorded.  GI: perc chole draining bilious fluid, soft nontender  Lab Results:  Recent Labs    09/09/20 0500 09/10/20 0500  WBC 11.9* 6.7  HGB 12.1* 11.0*  HCT 37.9* 34.4*  PLT 204 163   BMET Recent Labs    09/09/20 0500 09/10/20 0500  NA 133* 135  K 3.8 3.2*  CL 103 102  CO2 22 24  GLUCOSE 118* 107*  BUN 23 24*  CREATININE 0.97 1.11  CALCIUM 7.9* 7.8*   PT/INR No results for input(s): LABPROT, INR in the last 72 hours. ABG No results for input(s): PHART, HCO3 in the last 72 hours.  Invalid input(s): PCO2, PO2  Studies/Results: No results found.  Anti-infectives: Anti-infectives (From admission, onward)   Start     Dose/Rate Route Frequency Ordered Stop   09/08/20 1400  piperacillin-tazobactam (ZOSYN) IVPB 3.375 g  Status:  Discontinued        3.375 g 100 mL/hr over 30 Minutes Intravenous Every 8 hours 09/08/20 1238 09/08/20 1313   09/08/20 1400  piperacillin-tazobactam (ZOSYN) IVPB 3.375 g        3.375 g 12.5 mL/hr over 240 Minutes Intravenous Every 8 hours 09/08/20 1313     09/08/20 1100  ceFEPIme (MAXIPIME) 2 g in sodium chloride 0.9 % 100 mL IVPB  Status:  Discontinued        2 g 200 mL/hr over 30 Minutes Intravenous Every 12 hours 09/08/20 0731 09/08/20 1238   09/08/20 0330  piperacillin-tazobactam (ZOSYN)  IVPB 3.375 g        3.375 g 100 mL/hr over 30 Minutes Intravenous  Once 09/08/20 0323 09/08/20 0511   09/08/20 0000  ceFEPIme (MAXIPIME) 2 g in sodium chloride 0.9 % 100 mL IVPB        2 g 200 mL/hr over 30 Minutes Intravenous  Once 09/07/20 2353 09/08/20 0140   09/08/20 0000  vancomycin (VANCOREADY) IVPB 1500 mg/300 mL        1,500 mg 150 mL/hr over 120 Minutes Intravenous  Once 09/07/20 2353 09/08/20 0428      Assessment/Plan: Sepsis secondary to acute cholecystitis -LFTs trending down will recheck tomorrow -s/p perc chole drain placement by IR 11/18 -regular diet as tolerates -cx from perc chole drain with gram + cocci, rods as well as gram - rods and bcx pos rechecking  -cont to follow closely -will follow up Dr Kae Heller eventually  FEN -regular diet VTE -lovenox ID -[id number]   Right-sided HF Pulmonary HTN with cor pulmonale CAD, s/p CABG x5 A Fib, not on [facility] Bladder cancer ILD on O2 at home   Rolm Bookbinder 09/11/2020

## 2020-09-12 DIAGNOSIS — I952 Hypotension due to drugs: Secondary | ICD-10-CM | POA: Diagnosis not present

## 2020-09-12 DIAGNOSIS — J9600 Acute respiratory failure, unspecified whether with hypoxia or hypercapnia: Secondary | ICD-10-CM

## 2020-09-12 DIAGNOSIS — R6520 Severe sepsis without septic shock: Secondary | ICD-10-CM

## 2020-09-12 DIAGNOSIS — I272 Pulmonary hypertension, unspecified: Secondary | ICD-10-CM | POA: Diagnosis not present

## 2020-09-12 DIAGNOSIS — A419 Sepsis, unspecified organism: Secondary | ICD-10-CM | POA: Diagnosis not present

## 2020-09-12 DIAGNOSIS — K810 Acute cholecystitis: Secondary | ICD-10-CM | POA: Diagnosis not present

## 2020-09-12 LAB — COMPREHENSIVE METABOLIC PANEL
ALT: 64 U/L — ABNORMAL HIGH (ref 0–44)
AST: 25 U/L (ref 15–41)
Albumin: 2.8 g/dL — ABNORMAL LOW (ref 3.5–5.0)
Alkaline Phosphatase: 73 U/L (ref 38–126)
Anion gap: 12 (ref 5–15)
BUN: 15 mg/dL (ref 8–23)
CO2: 27 mmol/L (ref 22–32)
Calcium: 8 mg/dL — ABNORMAL LOW (ref 8.9–10.3)
Chloride: 96 mmol/L — ABNORMAL LOW (ref 98–111)
Creatinine, Ser: 0.99 mg/dL (ref 0.61–1.24)
GFR, Estimated: >60 mL/min (ref >60)
Glucose, Bld: 89 mg/dL (ref 70–99)
Potassium: 3.2 mmol/L — ABNORMAL LOW (ref 3.5–5.1)
Sodium: 135 mmol/L (ref 135–145)
Total Bilirubin: 2.4 mg/dL — ABNORMAL HIGH (ref 0.3–1.2)
Total Protein: 6.9 g/dL (ref 6.5–8.1)

## 2020-09-12 LAB — GLUCOSE, CAPILLARY
Glucose-Capillary: 98 mg/dL (ref 70–99)
Glucose-Capillary: 120 mg/dL — ABNORMAL HIGH (ref 70–99)
Glucose-Capillary: 90 mg/dL (ref 70–99)
Glucose-Capillary: 97 mg/dL (ref 70–99)

## 2020-09-12 LAB — CBC
HCT: 36.5 % — ABNORMAL LOW (ref 39.0–52.0)
Hemoglobin: 12 g/dL — ABNORMAL LOW (ref 13.0–17.0)
MCH: 32 pg (ref 26.0–34.0)
MCHC: 32.9 g/dL (ref 30.0–36.0)
MCV: 97.3 fL (ref 80.0–100.0)
Platelets: 204 10*3/uL (ref 150–400)
RBC: 3.75 MIL/uL — ABNORMAL LOW (ref 4.22–5.81)
RDW: 14.8 % (ref 11.5–15.5)
WBC: 7 10*3/uL (ref 4.0–10.5)
nRBC: 0 % (ref 0.0–0.2)

## 2020-09-12 MED ORDER — NOREPINEPHRINE 4 MG/250ML-% IV SOLN
0.0000 ug/min | INTRAVENOUS | Status: DC | PRN
  Filled 2020-09-12: qty 250

## 2020-09-12 MED ORDER — MACITENTAN 10 MG PO TABS
10.0000 mg | ORAL_TABLET | Freq: Every day | ORAL | Status: DC
  Administered 2020-09-12 – 2020-09-19 (×8): 10 mg via ORAL
  Filled 2020-09-12 (×5): qty 1

## 2020-09-12 MED ORDER — POTASSIUM CHLORIDE CRYS ER 20 MEQ PO TBCR
40.0000 meq | EXTENDED_RELEASE_TABLET | Freq: Once | ORAL | Status: AC
  Administered 2020-09-12: 40 meq via ORAL
  Filled 2020-09-12: qty 2

## 2020-09-12 MED ORDER — PANTOPRAZOLE SODIUM 40 MG PO TBEC
40.0000 mg | DELAYED_RELEASE_TABLET | ORAL | Status: DC
  Administered 2020-09-12 – 2020-09-18 (×7): 40 mg via ORAL
  Filled 2020-09-12 (×7): qty 1

## 2020-09-12 MED ORDER — MIDODRINE HCL 5 MG PO TABS
15.0000 mg | ORAL_TABLET | Freq: Three times a day (TID) | ORAL | Status: DC
  Administered 2020-09-12 – 2020-09-19 (×22): 15 mg via ORAL
  Filled 2020-09-12 (×23): qty 3

## 2020-09-12 NOTE — TOC Progression Note (Addendum)
Transition of Care [facility]) - Progression Note    Patient Details  Name: Vincent Robinson MRN: [id number] Date of Birth: 22-Jul-1923  Transition of Care [facility] CM/SW Contact  [name], RN Phone Number: 09/12/2020, 8:50 AM  Clinical Narrative:    86 y/o M admitted 11/17 with abdominal pain.  Work up concerning for septic shock secondary to acute cholecystitis, acute hypoxic resp failure.  total bilirubin=2.4 hfnc at 10l/min, iv zosyn, Patient is active with [facility] [place] is to return to home with spouse Following for progression.   Expected Discharge Plan: Home/Self Care Barriers to Discharge: No Barriers Identified  Expected Discharge Plan and Services Expected Discharge Plan: Home/Self Care   Discharge Planning Services: CM Consult   Living arrangements for the past 2 months: Single Family Home                                       Social Determinants of Health (SDOH) Interventions    Readmission Risk Interventions Readmission Risk Prevention Plan 03/25/2020 11/24/2019  Transportation Screening Complete Complete  PCP or Specialist Appt within 5-7 Days - Patient refused  Home Care Screening - Complete  Medication Review (RN CM) - Referral to Pharmacy  HRI or Home Care Consult Patient refused -  Palliative Care Screening Not Applicable -  Medication Review (RN Care Manager) Complete -  Some recent data might be hidden

## 2020-09-12 NOTE — Progress Notes (Signed)
Referring Physician(s): Connor,C  Supervising Physician: Daryll Brod  Patient Status:  Vincent Robinson  Chief Complaint:  cholecystitis  Subjective:  Pt sitting up in chair; denies sig abd pain,N/V; off pressors  Allergies: Patient has no known allergies.  Medications: Prior to Admission medications   Medication Sig Start Date End Date Taking? Authorizing Provider  albuterol (PROVENTIL HFA;VENTOLIN HFA) 108 (90 Base) MCG/ACT inhaler Inhale 1 puff into the lungs 2 (two) times daily. 05/20/17  Yes Mannam, Praveen, MD  fluticasone (VERAMYST) 27.5 MCG/SPRAY nasal spray Place 1 spray into the nose daily as needed for rhinitis.    Yes [provider]  Fluticasone-Salmeterol,sensor, 113-14 MCG/ACT AEPB Inhale 1 puff into the lungs 2 (two) times daily.   Yes [provider]  levothyroxine (SYNTHROID, LEVOTHROID) 112 MCG tablet Take 112 mcg by mouth daily before breakfast.   Yes [provider]  loratadine (CLARITIN) 10 MG tablet Take 10 mg by mouth daily as needed for allergies.    Yes [provider]  macitentan (OPSUMIT) 10 MG tablet Take 1 tablet (10 mg total) by mouth daily. Needs appt for future refills 08/19/20  Yes Bensimhon, Shaune Pascal, MD  montelukast (SINGULAIR) 10 MG tablet Take 10 mg by mouth at bedtime.   Yes [provider]  pantoprazole (PROTONIX) 20 MG tablet Take 20 mg by mouth 2 (two) times daily.    Yes [provider]  simvastatin (ZOCOR) 10 MG tablet Take 10 mg by mouth at bedtime.   Yes [provider]  torsemide (DEMADEX) 20 MG tablet Take 2 tablets (40 mg total) by mouth daily. 03/03/20  Yes Bensimhon, Shaune Pascal, MD  ferrous sulfate 325 (65 FE) MG tablet Take 1 tablet (325 mg total) by mouth 2 (two) times daily with a meal. Patient not taking: Reported on 09/07/2020 01/28/20   Bensimhon, Shaune Pascal, MD  midodrine (PROAMATINE) 10 MG tablet Take 1 tablet (10 mg total) by mouth 3 (three) times daily. Patient not  taking: Reported on 09/07/2020 03/03/20   Bensimhon, Shaune Pascal, MD  potassium chloride 20 MEQ/15ML (10%) SOLN Take 15 mLs (20 mEq total) by mouth 2 (two) times daily. Patient not taking: Reported on 09/07/2020 12/23/19   [name], MD  sildenafil (REVATIO) 20 MG tablet Take 2 tablets (40 mg total) by mouth 3 (three) times daily. Patient not taking: Reported on 09/07/2020 10/21/19   [name], PA-C     Vital Signs: BP (!) 119/46 (BP Location: Left Leg)   Pulse (!) 57   Temp (!) 97.4 F (36.3 C) (Oral)   Resp (!) 23   Ht 5\' 7"  (1.702 m)   Wt 164 lb 3.9 oz (74.5 kg)   SpO2 93%   BMI 25.72 kg/m   Physical Exam awake/alert; GB drain intact, insertion site ok, NT,OP 475 cc green bile  Imaging: IR Perc Cholecystostomy  Result Date: 09/08/2020 CLINICAL DATA:  Sepsis, hypotension, acute cholecystitis suspected on recent CT EXAM: PERCUTANEOUS CHOLECYSTOSTOMY TUBE PLACEMENT WITH ULTRASOUND AND FLUOROSCOPIC GUIDANCE FLUOROSCOPY TIME:  30 seconds; 6 mGy TECHNIQUE: The procedure, risks (including but not limited to bleeding, infection, organ damage ), benefits, and alternatives were explained to the patient. Questions regarding the procedure were encouraged and answered. The patient understands and consents to the procedure. Survey ultrasound of the abdomen was performed and an appropriate skin entry site was identified. Skin site was marked, prepped with chlorhexidine, and draped in usual sterile fashion, and infiltrated locally with 1% lidocaine. Intravenous versed  1mg  were administered as conscious sedation during continuous monitoring of the patient's level of consciousness and physiological / cardiorespiratory status by the radiology RN, with a total moderate sedation time of 16 minutes. Under real-time ultrasound guidance, gallbladder was accessed using a lateral approach with a 21-gauge needle. Ultrasound image documentation was saved. Bile returned through the hub. Needle was  exchanged over a 018 guidewire for transitional dilator which allowed placement of 035 J wire. Over this, a 10.2 French pigtail catheter was advanced and formed centrally in the gallbladder lumen. 20 mL dark green bile aspirated, sent for culture. Small contrast injection confirmed appropriate position. Catheter secured externally with 0 Prolene suture and placed external drain bag. Patient tolerated the procedure well. COMPLICATIONS: COMPLICATIONS none IMPRESSION: 1. Technically successful percutaneous cholecystostomy tube placement with ultrasound and fluoroscopic guidance. Electronically Signed   By: Lucrezia Europe M.D.   On: 09/08/2020 16:53   DG CHEST PORT 1 VIEW  Result Date: 09/08/2020 CLINICAL DATA:  Respiratory failure EXAM: PORTABLE CHEST 1 VIEW COMPARISON:  September 07, 2020 chest radiograph and chest CT September 08, 2020 FINDINGS: Central catheter tip is in the superior vena cava. No pneumothorax. Note that mandible obscures portions of each medial apex. There is interstitial pulmonary edema, most notably in the bases. There is no airspace consolidation. Heart is mildly enlarged with pulmonary venous hypertension. Status post median sternotomy. No adenopathy. No bone lesions. IMPRESSION: Central catheter tip in superior vena cava without pneumothorax. Mild cardiomegaly with a degree of pulmonary vascular congestion. There is a degree of interstitial edema. The overall appearance is indicative of a degree of congestive heart failure. No airspace consolidation. Electronically Signed   By: Lowella Grip III M.D.   On: 09/08/2020 19:19   ECHOCARDIOGRAM COMPLETE  Result Date: 09/08/2020    ECHOCARDIOGRAM REPORT   Patient Name:   Vincent Robinson Date of Exam: 09/08/2020 Medical Rec #:  [id number]      Height:       67.0 in Accession #:    [id number]     Weight:       155.0 lb Date of Birth:  [birth year]       BSA:          1.815 m Patient Age:    86 years       BP:           96/54 mmHg Patient Gender: M               HR:           59 bpm. Exam Location:  Inpatient Procedure: 2D Echo and Intracardiac Opacification Agent Indications:    Acute Respiratory Insufficiency 518.82 / R06.89  History:        Patient has prior history of Echocardiogram examinations, most                 recent 11/23/2019. CHF, Pulmonary HTN and COPD, Aortic Valve                 Disease; Risk Factors:Sleep Apnea, Former Smoker and                 Dyslipidemia. AAA s/p Thoracic Stent. Asthma. Hypothyroid.                 Chronic Hypoxic Respiratory Failure.  Sonographer:    [name] RDCS Referring Phys: [place]  1. Left ventricular ejection fraction, by estimation, is 55 to 60%. The left ventricle has normal  function. There is moderate asymmetric left ventricular hypertrophy of the basal-septal segment. Left ventricular diastolic parameters are consistent with Grade II diastolic dysfunction (pseudonormalization). Elevated left atrial pressure. There is the interventricular septum is flattened in systole and diastole, consistent with right ventricular pressure and volume overload.  2. Right ventricular systolic function is moderately reduced. The right ventricular size is severely enlarged. There is severely elevated pulmonary artery systolic pressure. The estimated right ventricular systolic pressure is 00.8 mmHg.  3. Left atrial size was mildly dilated.  4. The mitral valve is normal in structure. Mild mitral valve regurgitation. No evidence of mitral stenosis.  5. Tricuspid valve regurgitation is moderate to severe.  6. The aortic valve is calcified. There is moderate calcification of the aortic valve. Aortic valve regurgitation is mild. Moderate aortic valve stenosis. Vmax 3.0 m/s, MG 20 mmHg, AVA 1.45 cm^2, DI 0.29  7. Aortic dilatation noted. There is mild dilatation of the ascending aorta, measuring 38 mm.  8. The inferior vena cava is dilated in size with <50% respiratory variability, suggesting right atrial  pressure of 15 mmHg. FINDINGS  Left Ventricle: Left ventricular ejection fraction, by estimation, is 55 to 60%. The left ventricle has normal function. The left ventricle has no regional wall motion abnormalities. Definity contrast agent was given IV to delineate the left ventricular  endocardial borders. The left ventricular internal cavity size was normal in size. There is moderate asymmetric left ventricular hypertrophy of the basal-septal segment. The interventricular septum is flattened in systole and diastole, consistent with right ventricular pressure and volume overload. Left ventricular diastolic parameters are consistent with Grade II diastolic dysfunction (pseudonormalization). Elevated left atrial pressure. Right Ventricle: The right ventricular size is severely enlarged. Right vetricular wall thickness was not assessed. Right ventricular systolic function is moderately reduced. There is severely elevated pulmonary artery systolic pressure. The tricuspid regurgitant velocity is 4.08 m/s, and with an assumed right atrial pressure of 15 mmHg, the estimated right ventricular systolic pressure is 67.6 mmHg. Left Atrium: Left atrial size was mildly dilated. Right Atrium: Right atrial size was not well visualized. Pericardium: There is no evidence of pericardial effusion. Mitral Valve: The mitral valve is normal in structure. Mild mitral valve regurgitation. No evidence of mitral valve stenosis. Tricuspid Valve: The tricuspid valve is normal in structure. Tricuspid valve regurgitation is moderate to severe. Aortic Valve: The aortic valve is calcified. There is moderate calcification of the aortic valve. Aortic valve regurgitation is mild. Moderate aortic stenosis is present. Aortic valve mean gradient measures 16.2 mmHg. Aortic valve peak gradient measures 28.5 mmHg. Aortic valve area, by VTI measures 1.60 cm. Pulmonic Valve: The pulmonic valve was not well visualized. Pulmonic valve regurgitation is not  visualized. Aorta: The aortic root is normal in size and structure and aortic dilatation noted. There is mild dilatation of the ascending aorta, measuring 38 mm. Venous: The inferior vena cava is dilated in size with less than 50% respiratory variability, suggesting right atrial pressure of 15 mmHg. IAS/Shunts: The interatrial septum was not well visualized.  LEFT VENTRICLE PLAX 2D LVIDd:         4.10 cm      Diastology LVIDs:         2.90 cm      LV e' medial:    7.27 cm/s LV PW:         1.30 cm      LV E/e' medial:  12.3 LV IVS:        1.50 cm  LV e' lateral:   9.46 cm/s LVOT diam:     2.45 cm      LV E/e' lateral: 9.4 LV SV:         94 LV SV Index:   52 LVOT Area:     4.71 cm  LV Volumes (MOD) LV vol d, MOD A2C: 98.7 ml LV vol d, MOD A4C: 103.0 ml LV vol s, MOD A2C: 42.0 ml LV vol s, MOD A4C: 64.6 ml LV SV MOD A2C:     56.7 ml LV SV MOD A4C:     103.0 ml LV SV MOD BP:      49.1 ml RIGHT VENTRICLE RV S prime:     7.45 cm/s TAPSE (M-mode): 1.6 cm LEFT ATRIUM             Index LA diam:        4.20 cm 2.31 cm/m LA Vol (A2C):   74.8 ml 41.22 ml/m LA Vol (A4C):   59.3 ml 32.68 ml/m LA Biplane Vol: 68.3 ml 37.64 ml/m  AORTIC VALVE AV Area (Vmax):    1.64 cm AV Area (Vmean):   1.70 cm AV Area (VTI):     1.60 cm AV Vmax:           267.00 cm/s AV Vmean:          186.500 cm/s AV VTI:            0.590 m AV Peak Grad:      28.5 mmHg AV Mean Grad:      16.2 mmHg LVOT Vmax:         92.70 cm/s LVOT Vmean:        67.200 cm/s LVOT VTI:          0.200 m LVOT/AV VTI ratio: 0.34  AORTA Ao Root diam: 3.70 cm Ao Asc diam:  3.80 cm MITRAL VALVE               TRICUSPID VALVE MV Area (PHT): 5.84 cm    TR Peak grad:   66.6 mmHg MV Decel Time: 130 msec    TR Vmax:        408.00 cm/s MV E velocity: 89.10 cm/s MV A velocity: 61.70 cm/s  SHUNTS MV E/A ratio:  1.44        Systemic VTI:  0.20 m                            Systemic Diam: 2.45 cm Oswaldo Milian MD Electronically signed by Oswaldo Milian MD Signature  Date/Time: 09/08/2020/7:27:55 PM    Final     Labs:  CBC: Recent Labs    09/08/20 0740 09/09/20 0500 09/10/20 0500 09/12/20 0653  WBC 12.6* 11.9* 6.7 7.0  HGB 12.1* 12.1* 11.0* 12.0*  HCT 37.3* 37.9* 34.4* 36.5*  PLT 232 204 163 204    COAGS: Recent Labs    03/22/20 2008 09/07/20 2355 09/08/20 0740  INR 1.2 1.1 1.3*  APTT  --  32  --     BMP: Recent Labs    03/22/20 0042 03/22/20 0042 03/22/20 0426 03/22/20 0426 03/23/20 0542 03/23/20 0542 03/25/20 0514 09/07/20 2320 09/08/20 0740 09/09/20 0500 09/10/20 0500 09/12/20 0653  NA 140   < > 139   < > 138   < > 136   < > 136 133* 135 135  K 3.9   < > 3.9   < > 4.0   < >  4.2   < > 3.6 3.8 3.2* 3.2*  CL 102   < > 105   < > 104   < > 105   < > 102 103 102 96*  CO2 28   < > 25   < > 23   < > 24   < > 24 22 24 27   GLUCOSE 112*   < > 107*   < > 103*   < > 96   < > 129* 118* 107* 89  BUN 23   < > 23   < > 28*   < > 25*   < > 25* 23 24* 15  CALCIUM 8.6*   < > 8.0*   < > 8.1*   < > 8.0*   < > 7.9* 7.9* 7.8* 8.0*  CREATININE 1.39*   < > 1.24   < > 0.99   < > 0.97   < > 1.19 0.97 1.11 0.99  GFRNONAA 43*   < > 49*   < > >60   < > >60   < > 56* >60 >60 >60  GFRAA 50*  --  57*  --  >60  --  >60  --   --   --   --   --    < > = values in this interval not displayed.    LIVER FUNCTION TESTS: Recent Labs    09/08/20 0740 09/09/20 0500 09/10/20 0500 09/12/20 0653  BILITOT 3.7* 4.2* 3.0* 2.4*  AST 439* 147* 64* 25  ALT 278* 167* 108* 64*  ALKPHOS 184* 113 82 73  PROT 6.9 6.2* 5.7* 6.9  ALBUMIN 3.2* 2.7* 2.4* 2.8*    Assessment and Plan: Pt with hx sepsis/acute cholecystitis; s/p perc GB drain 11/18; afebrile; WBC nl; hgb 12(11), K 3.2- replace; creat nl; bile cx- enterococcus/enterobacter; cont current tx/drain irrigation; pt to keep drain at least 4-6 weeks unless GB removed surgically in interim; f/u cholangiogram in about 6 weeks or sooner if any problems arise   Electronically Signed: D. Rowe Robert,  PA-C 09/12/2020, 11:59 AM   I spent a total of  15 minutes at the the patient's bedside AND on the patient's hospital floor or unit, greater than 50% of which was counseling/coordinating care for gallbladder drain    Patient ID: Vincent Robinson, male   DOB: [birth year], 86 y.o.   MRN: [id number]

## 2020-09-12 NOTE — Progress Notes (Signed)
Physical Therapy Treatment Patient Details Name: Vincent Robinson MRN: [id number] DOB: 1923/02/04 Today's Date: 09/12/2020    History of Present Illness 86 year old with asthma, ILD, pulmonary hypertension, OSA, CABG Admitted 09/07/20  with septic shock, acute cholecystitis. S/p perc chole drain placement by IR 11/18    PT Comments    Pt AxO x 3 pleasant eating breakfast and very willing to participate.  Currently on 10 HFNC oxygen at 94%. Vitals during session Supine    BP 119/60   HR 85  sats 92%  10 HFNC  EOB       BP 95/39     HR 88  sats 90%   10 HFNC mild c/o dizziness Standing  BP 89/50    HR 89  sats 85%   10 HFNC Final       BP 97/39     HR 82   sats 90%   10 HFNC Assisted OOB to recliner only.  Did not attempt amb due to hypotension.  Pt did take a few steps to recliner.  Positioned in recliner to comfort.   Follow Up Recommendations  Home health PT;Supervision/Assistance - 24 hour     Equipment Recommendations  None recommended by PT    Recommendations for Other Services       Precautions / Restrictions Precautions Precautions: Fall Precaution Comments: R flank drain    Mobility  Bed Mobility Overal bed mobility: Needs Assistance Bed Mobility: Supine to Sit     Supine to sit: Min guard     General bed mobility comments: use of bedrail and increased time  Transfers Overall transfer level: Needs assistance Equipment used: Rolling walker (2 wheeled) Transfers: Sit to/from [facility] Sit to Stand: +2 safety/equipment;Min guard Stand pivot transfers: Min guard;+2 safety/equipment       General transfer comment: Sit to stand min guard with RW, able to walk approx 2 feet with RW and min guard forward and then back up to recliner. No loss of balance. Activity and distance limited by Hypotension  Ambulation/Gait Ambulation/Gait assistance: Min guard Gait Distance (Feet): 2 Feet Assistive device: Rolling walker (2 wheeled) Gait  Pattern/deviations: Step-to pattern     General Gait Details: a few steps from bed to recliner only due to hypotension   Stairs             Wheelchair Mobility    Modified Rankin (Stroke Patients Only)       Balance                                            Cognition   Behavior During Therapy: WFL for tasks assessed/performed Overall Cognitive Status: Within Functional Limits for tasks assessed                                 General Comments: AxO x 3 pleasant/motivated      Exercises      General Comments        Pertinent Vitals/Pain Pain Assessment: No/denies pain    Home Living                      Prior Function            PT Goals (current goals can now be found in the care plan section) Progress  towards PT goals: Progressing toward goals    Frequency    Min 3X/week      PT Plan Current plan remains appropriate    Co-evaluation              AM-PAC PT "6 Clicks" Mobility   Outcome Measure  Help needed turning from your back to your side while in a flat bed without using bedrails?: A Little Help needed moving from lying on your back to sitting on the side of a flat bed without using bedrails?: A Little Help needed moving to and from a bed to a chair (including a wheelchair)?: A Little Help needed standing up from a chair using your arms (e.g., wheelchair or bedside chair)?: A Little Help needed to walk in hospital room?: A Lot Help needed climbing 3-5 steps with a railing? : A Lot 6 Click Score: 16    End of Session Equipment Utilized During Treatment: Oxygen;Gait belt Activity Tolerance: Patient tolerated treatment well Patient left: in chair;with call bell/phone within reach;with chair alarm set Nurse Communication: Mobility status PT Visit Diagnosis: Unsteadiness on feet (R26.81);Difficulty in walking, not elsewhere classified (R26.2)     Time: 1000-1027 PT Time Calculation  (min) (ACUTE ONLY): 27 min  Charges:  $Therapeutic Activity: 23-37 mins                     Rica Koyanagi  PTA Acute  Rehabilitation Services Pager      [phone] Office      [phone]

## 2020-09-12 NOTE — Progress Notes (Signed)
NAME:  Vincent Robinson, MRN:  [id number], DOB:  1923/04/14, LOS: 4 ADMISSION DATE:  09/07/2020, CONSULTATION DATE:  09/08/20 REFERRING MD:  Dr April Pulumbo, CHIEF COMPLAINT:  Septic shock from cholecystitis  Brief History   86 y/o M admitted 11/17 with abdominal pain.  Work up concerning for septic shock secondary to acute cholecystitis, acute hypoxic resp failure.   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 AF on monitor / EKG pending  Consults:  Surgery IR  Procedures:  Perc bili drain  Significant Diagnostic Tests:  11/18 CT ABD/Pelvis w Contrast >> acute cholecystitis, 2.3 CM enhancing urothelial carcinoma within the right posterior bladder lumen in the region of the right ureterovesicular junction  11/18 CTA Chest 11/8 >> negative for PE, morphologic changes of pulmonary arterial hypertension, elevated right heart pressures, some degree of right heart failure, stable 4.5 cm thoracic aortic aneurysm, patchy ground-glass infiltrate in LUL 11/18 ECHO >> LVEF 55-60%, moderate asymetric LV hypertrophy of the basal-septal segment, grade II diastolic dysfunction, elevated LA pressure, interventricular septum is flattened in systole and diastole consistent with RV pressure overload, RV systolic function moderately reduced, RV severely enlarged, RV systolic pressure 81, moderate to severe TR  Micro Data:  COVID 11/18 >> negative  Influenza 11/18 >> negative BCx2 11/17 >> 1/2 with GPC's >> Staph hominis and staph lugdunesis U. Strep Antigen 11/18 >> negative  Legionella 11/18 >>    UC 11/18 >>  Gallbladder Culture 11/18 >> moderate GPC, few GPR, rare GNR >> Enterococcus faecalis (pan-sens), enterobacter aerogenes Blood culture 11/21>  Antimicrobials:  Vanco 11/17 >> 11/17 Cefepime 11/17 >> 11/18 Zosyn 11/18 >>   Interim history/subjective:  Remains on 10L O2 at rest and desaturates with movement.  He denies new complaints.  He continues to have low blood pressures, but only has mild dizziness when he first gets out of bed after waking up in the morning.  Objective   Blood pressure (!) 119/46, pulse (!) 57, temperature (!) 97.4 F (36.3 C), temperature source Oral, resp. rate (!) 23, height 5\' 7"  (1.702 m), weight 74.5 kg, SpO2 93 %.        Intake/Output Summary (Last 24 hours) at 09/12/2020 0953 Last data filed at 09/12/2020 0556 Gross per 24 hour  Intake 340.64 ml  Output 3875 ml  Net -3534.36 ml   Filed Weights   09/10/20 0500 09/11/20 0416 09/12/20 0607  Weight: 75.7 kg 74.6 kg 74.5 kg    Examination: General: Frail appearing elderly man sitting up in the chair no acute distress HEENT: [place]/AT, eyes anicteric, oral mucosa moist. Neuro: Awake and alert, answering questions appropriately, moving all extremities.  Needs minimal assistance with transferring from bed to chair CV: S1-S2, regular rhythm, regular rate.  Mild systolic left sternal border murmur PULM: Breathing comfortably on 10 L nasal cannula.  No rhonchi or wheezing.  Mild conversational dyspnea. GI: Abdominal pain, percutaneous biliary drain with thin brown drainage Extremities: Mild pedal edema, no pretibial edema, no cyanosis or clubbing. Skin: No rashes or bleeding  Resolved Hospital Problem list  Septic shock  Assessment & Plan:   Sepsis due to acute cholecystitis due to Enterococcus and Enterobacter; shock resolved. LFTs downtrending appropriately. On midodrine PTA -Remains off vasopressors.  Continued soft blood pressures with low-dose sildenafil being restarted.  May require  norepinephrine temporarily to restart macitentan.  Increase midodrine to 15 mg 3 times daily -appreciate IR & surgery's assistance. Will need OP surgery follow up. -con't perc bili drain -Continue to follow repeat blood cultures to ensure they are negative  Severe Pulmonary HTN - chronic;  Emphysema  Post Inflammatory ILD  Acute on Chronic Hypoxic Respiratory Failure  OSA on CPAP -Restart home macitentan-may require low-dose vasopressors or inotropes to facilitate this. Cntinue reduced dose sildenafil 3 times daily and daily torsemide. -supplemental O2 to support sats >88%; okay to titrate up her mobility -con't bronchodilators -CPAP QHS -Patient requires ongoing ICU care due to concern for hypotension as we restart pulmonary vasodilators.  Chronic HFpEF and Cor Pulmonale (sees Dr Jeffie Pollock as an outpatient) -Continue torsemide -Continue low-dose sildenafil -Restarting macitentan.  Hypokalemia  -Repleted -Continue to monitor    Suspected Urothelial Cancer (noted on CT in June 2021) -will need Urology consult prior to discharge   Hypothyroidism  TSH 0.827 on admit  -continue synthroid   Mild hyperglycemia -accuchecks TIDAC  Chronic anemia; currently Hb above baseline and stable -con't to monitor -transfuse for Hb <7 or hemodynamically significant bleeding  Best practice:  Diet: regular diet Pain/Anxiety/Delirium protocol (if indicated): n/a VAP protocol (if indicated): n/a DVT prophylaxis: heparin GI prophylaxis: PPI Glucose control: monitor glucose  Mobility: bed rest Code Status: full code   Family Communication: Patient updated on plan of care 11/22 Disposition: ICU  LABS    PULMONARY No results for input(s): PHART, PCO2ART, PO2ART, HCO3, TCO2, O2SAT in the last 168 hours.  Invalid input(s): PCO2, PO2  CBC Recent Labs  Lab 09/09/20 0500 09/10/20 0500 09/12/20 0653  HGB 12.1* 11.0* 12.0*  HCT 37.9* 34.4* 36.5*  WBC 11.9* 6.7 7.0  PLT 204 163 204     COAGULATION Recent Labs  Lab 09/07/20 2355 09/08/20 0740  INR 1.1 1.3*    CARDIAC  No results for input(s): TROPONINI in the last 168 hours. No results for input(s): PROBNP in the last 168 hours.   CHEMISTRY Recent Labs  Lab 09/07/20 2320 09/07/20 2320 09/08/20 0011 09/08/20 0740 09/08/20 0740 09/09/20 0500 09/09/20 0500 09/10/20 0500 09/12/20 0653  NA 137  --   --  136  --  133*  --  135 135  K 3.3*   < >  --  3.6   < > 3.8   < > 3.2* 3.2*  CL 102  --   --  102  --  103  --  102 96*  CO2 24  --   --  24  --  22  --  24 27  GLUCOSE 113*  --   --  129*  --  118*  --  107* 89  BUN 26*  --   --  25*  --  23  --  24* 15  CREATININE 1.14   < > 1.20 1.19  --  0.97  --  1.11 0.99  CALCIUM 8.4*  --   --  7.9*  --  7.9*  --  7.8* 8.0*  MG  --   --   --  2.0  --  1.7  --   --   --   PHOS  --   --   --  4.2  --  3.8  --   --   --    < > = values in this interval not displayed.   Estimated Creatinine Clearance: 41.7 mL/min (by C-G formula based on SCr of 0.99 mg/dL).   LIVER Recent Labs  Lab 09/07/20 2320 09/07/20 2355 09/08/20 0740 09/09/20 0500 09/10/20 0500 09/12/20 0653  AST 608*  --  439* 147* 64* 25  ALT 297*  --  278* 167* 108* 64*  ALKPHOS 227*  --  184* 113 82 73  BILITOT 2.7*  --  3.7* 4.2* 3.0* 2.4*  PROT 7.8  --  6.9 6.2* 5.7* 6.9  ALBUMIN 3.8  --  3.2* 2.7* 2.4* 2.8*  INR  --  1.1 1.3*  --   --   --     INFECTIOUS Recent Labs  Lab 09/07/20 2355 09/08/20 0740 09/08/20 1043  LATICACIDVEN 1.5 1.3 2.1*  PROCALCITON  --  6.79  --     ENDOCRINE CBG (last 3)  Recent Labs    09/11/20 1223 09/11/20 1602 09/12/20 0834  GLUCAP 100* 83 98    This patient is critically ill with multiple organ system failure which requires frequent high complexity decision making, assessment, support, evaluation, and titration of therapies. This was completed through the application of advanced monitoring technologies and extensive interpretation of multiple  databases. During this encounter critical care time was devoted to patient care services described in this note for 46 minutes.   Julian Hy, DO 09/12/20 9:53 AM [facility]

## 2020-09-12 NOTE — Progress Notes (Signed)
[facility] Progress Note     Subjective: CC-  Up in chair. No abdominal complaints today. Denies pain, nausea, vomiting. Tolerating diet. BM yesterday. WBC 7, LFTs continue to down trend. Perc chole drain in place with 475cc bilious output last 24 hours. Off pressors.  Objective: Vital signs in last 24 hours: Temp:  [97.4 F (36.3 C)-99.4 F (37.4 C)] 97.4 F (36.3 C) (11/22 0800) Pulse Rate:  [44-158] 57 (11/22 0800) Resp:  [13-25] 23 (11/22 0800) BP: (82-145)/(33-102) 119/46 (11/22 0800) SpO2:  [86 %-100 %] 93 % (11/22 0838) Weight:  [74.5 kg] 74.5 kg (11/22 0607) Last BM Date: 09/09/20  Intake/Output from previous day: 11/21 0701 - 11/22 0700 In: 340.6 [I.V.:219.7; IV Piggyback:111] Out: 6962 [Urine:3400; Drains:475] Intake/Output this shift: No intake/output data recorded.  PE: Gen:  Alert, NAD, pleasant Pulm: breathing comfortably on 10L [place] Abd: Soft, NT, +BS, perc chole draining bilious fluid Skin: warm and dry  Lab Results:  Recent Labs    09/10/20 0500 09/12/20 0653  WBC 6.7 7.0  HGB 11.0* 12.0*  HCT 34.4* 36.5*  PLT 163 204   BMET Recent Labs    09/10/20 0500 09/12/20 0653  NA 135 135  K 3.2* 3.2*  CL 102 96*  CO2 24 27  GLUCOSE 107* 89  BUN 24* 15  CREATININE 1.11 0.99  CALCIUM 7.8* 8.0*   PT/INR No results for input(s): LABPROT, INR in the last 72 hours. CMP     Component Value Date/Time   NA 135 09/12/2020 0653   K 3.2 (L) 09/12/2020 0653   CL 96 (L) 09/12/2020 0653   CO2 27 09/12/2020 0653   GLUCOSE 89 09/12/2020 0653   BUN 15 09/12/2020 0653   CREATININE 0.99 09/12/2020 0653   CALCIUM 8.0 (L) 09/12/2020 0653   PROT 6.9 09/12/2020 0653   ALBUMIN 2.8 (L) 09/12/2020 0653   AST 25 09/12/2020 0653   ALT 64 (H) 09/12/2020 0653   ALKPHOS 73 09/12/2020 0653   BILITOT 2.4 (H) 09/12/2020 0653   GFRNONAA >60 09/12/2020 0653   GFRAA >60 03/25/2020 0514   Lipase  No results found for:  LIPASE     Studies/Results: No results found.  Anti-infectives: Anti-infectives (From admission, onward)   Start     Dose/Rate Route Frequency Ordered Stop   09/08/20 1400  piperacillin-tazobactam (ZOSYN) IVPB 3.375 g  Status:  Discontinued        3.375 g 100 mL/hr over 30 Minutes Intravenous Every 8 hours 09/08/20 1238 09/08/20 1313   09/08/20 1400  piperacillin-tazobactam (ZOSYN) IVPB 3.375 g        3.375 g 12.5 mL/hr over 240 Minutes Intravenous Every 8 hours 09/08/20 1313     09/08/20 1100  ceFEPIme (MAXIPIME) 2 g in sodium chloride 0.9 % 100 mL IVPB  Status:  Discontinued        2 g 200 mL/hr over 30 Minutes Intravenous Every 12 hours 09/08/20 0731 09/08/20 1238   09/08/20 0330  piperacillin-tazobactam (ZOSYN) IVPB 3.375 g        3.375 g 100 mL/hr over 30 Minutes Intravenous  Once 09/08/20 0323 09/08/20 0511   09/08/20 0000  ceFEPIme (MAXIPIME) 2 g in sodium chloride 0.9 % 100 mL IVPB        2 g 200 mL/hr over 30 Minutes Intravenous  Once 09/07/20 2353 09/08/20 0140   09/08/20 0000  vancomycin (VANCOREADY) IVPB 1500 mg/300 mL        1,500 mg 150 mL/hr over 120 Minutes  Intravenous  Once 09/07/20 2353 09/08/20 0428       Assessment/Plan Sepsis secondary to acute cholecystitis -s/p perc chole drain placement by IR 11/18 -cx ENTEROCOCCUS FAECALIS, report pending >>continue antibiotics, recommend 14 day course -LFTs continue to trend down -tolerating regular diet and abdominal exam benign -Continue drain and antibiotics. We will sign off, please call with any concerns. Follow up in about 1 month with Dr. Kae Heller.  FEN -regular diet VTE-lovenox ID-zosyn 11/18>>day#5 Foley - none Follow up - Dr. Kae Heller, IR    LOS: 4 days    Wellington Hampshire, [facility] 09/12/2020, 11:41 AM Please see Amion for pager number during day hours 7:00am-4:30pm

## 2020-09-13 DIAGNOSIS — K810 Acute cholecystitis: Secondary | ICD-10-CM | POA: Diagnosis not present

## 2020-09-13 DIAGNOSIS — I2729 Other secondary pulmonary hypertension: Secondary | ICD-10-CM | POA: Diagnosis not present

## 2020-09-13 DIAGNOSIS — J9621 Acute and chronic respiratory failure with hypoxia: Secondary | ICD-10-CM | POA: Diagnosis not present

## 2020-09-13 LAB — BASIC METABOLIC PANEL
Anion gap: 11 (ref 5–15)
BUN: 15 mg/dL (ref 8–23)
CO2: 29 mmol/L (ref 22–32)
Calcium: 7.7 mg/dL — ABNORMAL LOW (ref 8.9–10.3)
Chloride: 95 mmol/L — ABNORMAL LOW (ref 98–111)
Creatinine, Ser: 1.16 mg/dL (ref 0.61–1.24)
GFR, Estimated: 58 mL/min — ABNORMAL LOW (ref >60)
Glucose, Bld: 89 mg/dL (ref 70–99)
Potassium: 3.6 mmol/L (ref 3.5–5.1)
Sodium: 135 mmol/L (ref 135–145)

## 2020-09-13 LAB — CULTURE, BLOOD (ROUTINE X 2)
Culture: NO GROWTH
Special Requests: ADEQUATE

## 2020-09-13 LAB — AEROBIC/ANAEROBIC CULTURE W GRAM STAIN (SURGICAL/DEEP WOUND): Gram Stain: NONE SEEN

## 2020-09-13 LAB — COOXEMETRY PANEL
Carboxyhemoglobin: 1.6 % — ABNORMAL HIGH (ref 0.5–1.5)
Methemoglobin: 0.7 % (ref 0.0–1.5)
O2 Saturation: 66.3 %
Total hemoglobin: 11.3 g/dL — ABNORMAL LOW (ref 12.0–16.0)

## 2020-09-13 LAB — MAGNESIUM: Magnesium: 1.8 mg/dL (ref 1.7–2.4)

## 2020-09-13 LAB — GLUCOSE, CAPILLARY: Glucose-Capillary: 85 mg/dL (ref 70–99)

## 2020-09-13 NOTE — Progress Notes (Signed)
Referring Physician(s): Connor,C  Supervising Physician: Dr. Laurence Ferrari  Patient Status:  [facility] - In-pt  Chief Complaint:  cholecystitis  Subjective:  Pt sitting up in chair Feels pretty good, tolerating diet  Allergies: Patient has no known allergies.  Medications:  Current Facility-Administered Medications:  .  0.9 %  sodium chloride infusion, 250 mL, Intravenous, Continuous, McDonald, Mia A, PA-C, Stopped at 09/13/20 0644 .  Chlorhexidine Gluconate Cloth 2 % PADS 6 each, 6 each, Topical, Daily, Mannam, Praveen, MD, 6 each at 09/13/20 1030 .  docusate sodium (COLACE) capsule 100 mg, 100 mg, Oral, BID PRN, Brand Males, MD .  enoxaparin (LOVENOX) injection 30 mg, 30 mg, Subcutaneous, Daily, Mannam, Praveen, MD, 30 mg at 09/13/20 0939 .  HYDROcodone-acetaminophen (NORCO/VICODIN) 5-325 MG per tablet 1-2 tablet, 1-2 tablet, Oral, Q4H PRN, Arne Cleveland, MD, 1 tablet at 09/08/20 1900 .  ipratropium-albuterol (DUONEB) 0.5-2.5 (3) MG/3ML nebulizer solution 3 mL, 3 mL, Nebulization, BID, Mannam, Praveen, MD, 3 mL at 09/13/20 0909 .  ketotifen (ZADITOR) 0.025 % ophthalmic solution 1 drop, 1 drop, Right Eye, BID, Ollis, Brandi L, NP, 1 drop at 09/13/20 0944 .  levothyroxine (SYNTHROID) tablet 100 mcg, 100 mcg, Oral, Q0600, Brand Males, MD, 100 mcg at 09/13/20 0716 .  macitentan (OPSUMIT) tablet 10 mg, 10 mg, Oral, Daily, Noemi Chapel P, DO, 10 mg at 09/13/20 1005 .  MEDLINE mouth rinse, 15 mL, Mouth Rinse, BID, Mannam, Praveen, MD, 15 mL at 09/13/20 0940 .  midodrine (PROAMATINE) tablet 15 mg, 15 mg, Oral, TID PC, Julian Hy, DO, 15 mg at 09/13/20 0939 .  mometasone-formoterol (DULERA) 100-5 MCG/ACT inhaler 2 puff, 2 puff, Inhalation, BID, Frederik Pear, MD, 2 puff at 09/13/20 0915 .  norepinephrine (LEVOPHED) 4mg  in 265mL premix infusion, 0-40 mcg/min, Intravenous, PRN, Noemi Chapel P, DO .  ondansetron (ZOFRAN) injection 4 mg, 4 mg, Intravenous, Q6H PRN, Frederik Pear, MD, 4 mg at 09/13/20 0402 .  pantoprazole (PROTONIX) EC tablet 40 mg, 40 mg, Oral, Q24H, Julian Hy, DO, 40 mg at 09/12/20 2140 .  piperacillin-tazobactam (ZOSYN) IVPB 3.375 g, 3.375 g, Intravenous, Q8H, Saverio Danker, PA-C, Last Rate: 12.5 mL/hr at 09/13/20 0900, Rate Verify at 09/13/20 0900 .  polyethylene glycol (MIRALAX / GLYCOLAX) packet 17 g, 17 g, Oral, Daily PRN, Brand Males, MD .  sildenafil (REVATIO) tablet 20 mg, 20 mg, Oral, TID, Noemi Chapel P, DO, 20 mg at 09/13/20 0940 .  sodium chloride flush (NS) 0.9 % injection 10-40 mL, 10-40 mL, Intracatheter, Q12H, Mannam, Praveen, MD, 10 mL at 09/13/20 0940 .  sodium chloride flush (NS) 0.9 % injection 10-40 mL, 10-40 mL, Intracatheter, PRN, Mannam, Praveen, MD, 10 mL at 09/08/20 2149 .  sodium chloride flush (NS) 0.9 % injection 5 mL, 5 mL, Intracatheter, Q8H, Arne Cleveland, MD, 5 mL at 09/13/20 [phone] .  torsemide (DEMADEX) tablet 40 mg, 40 mg, Oral, Daily, Julian Hy, DO, 40 mg at 09/13/20 0939    Vital Signs: BP (!) 81/49   Pulse 100   Temp 97.6 F (36.4 C) (Oral)   Resp 14   Ht 5\' 7"  (1.702 m)   Wt 74.5 kg   SpO2 (!) 84%   BMI 25.72 kg/m   Physical Exam awake/alert; GB drain intact, insertion site ok, NT, Thin bilious output  Imaging: No results found.  Labs:  CBC: Recent Labs    09/08/20 0740 09/09/20 0500 09/10/20 0500 09/12/20 0653  WBC 12.6* 11.9* 6.7 7.0  HGB  12.1* 12.1* 11.0* 12.0*  HCT 37.3* 37.9* 34.4* 36.5*  PLT 232 204 163 204    COAGS: Recent Labs    03/22/20 2008 09/07/20 2355 09/08/20 0740  INR 1.2 1.1 1.3*  APTT  --  32  --     BMP: Recent Labs    03/22/20 0042 03/22/20 0042 03/22/20 0426 03/22/20 0426 03/23/20 0542 03/23/20 0542 03/25/20 0514 09/07/20 2320 09/09/20 0500 09/10/20 0500 09/12/20 0653 09/13/20 0825  NA 140   < > 139   < > 138   < > 136   < > 133* 135 135 135  K 3.9   < > 3.9   < > 4.0   < > 4.2   < > 3.8 3.2* 3.2* 3.6  CL 102   <  > 105   < > 104   < > 105   < > 103 102 96* 95*  CO2 28   < > 25   < > 23   < > 24   < > 22 24 27 29   GLUCOSE 112*   < > 107*   < > 103*   < > 96   < > 118* 107* 89 89  BUN 23   < > 23   < > 28*   < > 25*   < > 23 24* 15 15  CALCIUM 8.6*   < > 8.0*   < > 8.1*   < > 8.0*   < > 7.9* 7.8* 8.0* 7.7*  CREATININE 1.39*   < > 1.24   < > 0.99   < > 0.97   < > 0.97 1.11 0.99 1.16  GFRNONAA 43*   < > 49*   < > >60   < > >60   < > >60 >60 >60 58*  GFRAA 50*  --  57*  --  >60  --  >60  --   --   --   --   --    < > = values in this interval not displayed.    LIVER FUNCTION TESTS: Recent Labs    09/08/20 0740 09/09/20 0500 09/10/20 0500 09/12/20 0653  BILITOT 3.7* 4.2* 3.0* 2.4*  AST 439* 147* 64* 25  ALT 278* 167* 108* 64*  ALKPHOS 184* 113 82 73  PROT 6.9 6.2* 5.7* 6.9  ALBUMIN 3.2* 2.7* 2.4* 2.8*    Assessment and Plan: Pt with hx sepsis/acute cholecystitis; s/p perc GB drain 11/18;  Doing well. Plan for pt to keep drain at least 4-6 weeks unless GB removed surgically in interim; f/u cholangiogram in about 6 weeks or sooner if any problems arise   Electronically Signed: Ascencion Dike, PA-C 09/13/2020, 10:28 AM   I spent a total of  15 minutes at the the patient's bedside AND on the patient's hospital floor or unit, greater than 50% of which was counseling/coordinating care for gallbladder drain

## 2020-09-13 NOTE — Progress Notes (Addendum)
NAME:  Vincent Robinson, MRN:  [id number], DOB:  11/11/1922, LOS: 5 ADMISSION DATE:  09/07/2020, CONSULTATION DATE:  09/08/20 REFERRING MD:  Dr April Pulumbo, CHIEF COMPLAINT:  Septic shock from cholecystitis  Brief History   86 y/o M admitted 11/17 with abdominal pain.  Work up concerning for septic shock secondary to acute cholecystitis, acute hypoxic resp failure.   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 AF on monitor / EKG pending  Consults:  Surgery IR  Procedures:  Perc bili drain  Significant Diagnostic Tests:  11/18 CT ABD/Pelvis w Contrast >> acute cholecystitis, 2.3 CM enhancing urothelial carcinoma within the right posterior bladder lumen in the region of the right ureterovesicular junction  11/18 CTA Chest 11/8 >> negative for PE, morphologic changes of pulmonary arterial hypertension, elevated right heart pressures, some degree of right heart failure, stable 4.5 cm thoracic aortic aneurysm, patchy ground-glass infiltrate in LUL 11/18 ECHO >> LVEF 55-60%, moderate asymetric LV hypertrophy of the basal-septal segment, grade II diastolic dysfunction, elevated LA pressure, interventricular septum is flattened in systole and diastole consistent with RV pressure overload, RV systolic function moderately reduced, RV severely enlarged, RV systolic pressure 81, moderate to severe TR  Micro Data:  COVID 11/18 >> negative  Influenza 11/18 >> negative BCx2 11/17 >> 1/2 with GPC's >> Staph hominis and staph lugdunesis U. Strep Antigen 11/18 >> negative  Legionella 11/18 >>    UC 11/18 >>  Gallbladder Culture 11/18 >> moderate GPC, few GPR, rare GNR >> Enterococcus faecalis, enterobacter aerogenes Blood culture 11/21>  Antimicrobials:  Vanco 11/17 >> 11/17 Cefepime 11/17 >> 11/18 Zosyn 11/18 >>   Interim history/subjective:  NAEO Remains off pressors  Episodic nausea this morning  Objective   Blood pressure (!) 83/55, pulse (!) 50, temperature 97.9 F (36.6 C), temperature source Oral, resp. rate 19, height 5\' 7"  (1.702 m), weight 74.5 kg, SpO2 93 %.        Intake/Output Summary (Last 24 hours) at 09/13/2020 0802 Last data filed at 09/13/2020 0500 Gross per 24 hour  Intake 277.7 ml  Output 2700 ml  Net -2422.3 ml   Filed Weights   09/10/20 0500 09/11/20 0416 09/12/20 0607  Weight: 75.7 kg 74.6 kg 74.5 kg    Examination: General: Chronically ill, frail appearing elderly M, reclined in bed NAD  HEENT: NCAT anicteric sclera. Poor dentition, trachea midline  Neuro: AAOx3 following commands. No focal deficits  CV:s1s2 no rgm cap refill < 3 seconds  PULM: Symmetrical chest expansion, even unlabored respirations. No adventitious sounds  GI: Perc biliary drain. Abdomen soft round ndnt + bowel sounds  Extremities: BLE stockings and mild edema. No obvious joint deformity  Skin: dry, thin appearing. No rash. Clean, dry, warm   Resolved Hospital Problem list   Shock   Assessment & Plan:   Sepsis due to acute cholecystitis  due to Enterococcus and Enterobacter On midodrine PTA -repeat BCx 11/20 without growth (11/17 BCx w staph likely contaminant)  P -s/p perc GB drain with IR 11/18 -- cont for 4-6wk  -cont  zosyn - 14d course  -cont midodrine  -OP surgery follow up in approx 1 month -Cholangiogram in approx 6wk  -cont PTA midodrine  -remains off pressors. Goal MAP > 55 -- restart pressors if hypotensive with pulm hypertension meds   Severe Pulmonary HTN - chronic Emphysema  Post Inflammatory ILD  Acute on Chronic Hypoxic Respiratory  Failure  OSA on CPAP -continue sildenafil TID (currently at reduced dose) -opsumit  -Patient requires ongoing ICU care due to concern for hypotension as we restart pulmonary vasodilators -MAP goal > 55, can start pressors if needed to continue pulm HTN meds  -supplemental O2 to support sats >88% -Dulera, Duoneb -CPAP QHS  Chronic HFpEF and Cor Pulmonale (sees Dr Jeffie Pollock as an outpatient) -Cont Demadex   Hypokalemia  -trend -replace PRN   Suspected Urothelial Cancer (noted on CT in June 2021) -will need Urology consult prior to discharge   Hypothyroidism  TSH 0.827 on admit  -continue synthroid   Mild hyperglycemia, improved -dc SSI  Chronic anemia currently Hb above baseline -monitor  -transfuse for Hb <7 or hemodynamically significant bleeding  Nausea pt feels due to taking midodrine without food -snack w midodrine -PRN anti emetic   Best practice:  Diet: regular diet Pain/Anxiety/Delirium protocol (if indicated): n/a VAP protocol (if indicated): n/a DVT prophylaxis: lovenox  GI prophylaxis: PPI Glucose control: monitor  Mobility: PT, OT  Code Status: full code   Family Communication: pt updated 11/23  Disposition: ICU  LABS    PULMONARY No results for input(s): PHART, PCO2ART, PO2ART, HCO3, TCO2, O2SAT in the last 168 hours.  Invalid input(s): PCO2, PO2  CBC Recent Labs  Lab 09/09/20 0500 09/10/20 0500 09/12/20 0653  HGB 12.1* 11.0* 12.0*  HCT 37.9* 34.4* 36.5*  WBC 11.9* 6.7 7.0  PLT 204 163 204    COAGULATION Recent Labs  Lab 09/07/20 2355 09/08/20 0740  INR 1.1 1.3*    CARDIAC  No results for input(s): TROPONINI in the last 168 hours. No results for input(s): PROBNP in the last 168 hours.   CHEMISTRY Recent Labs  Lab 09/07/20 2320 09/07/20 2320 09/08/20 0011 09/08/20 0740 09/08/20 0740 09/09/20 0500 09/09/20 0500 09/10/20 0500 09/12/20 0653  NA 137  --   --  136  --  133*  --  135 135  K 3.3*   < >  --  3.6   < >  3.8   < > 3.2* 3.2*  CL 102  --   --  102  --  103  --  102 96*  CO2 24  --   --  24  --  22  --  24 27  GLUCOSE 113*  --   --  129*  --  118*  --  107* 89  BUN 26*  --   --  25*  --  23  --  24* 15  CREATININE 1.14   < > 1.20 1.19  --  0.97  --  1.11 0.99  CALCIUM 8.4*  --   --  7.9*  --  7.9*  --  7.8* 8.0*  MG  --   --   --  2.0  --  1.7  --   --   --   PHOS  --   --   --  4.2  --  3.8  --   --   --    < > = values in this interval not displayed.   Estimated Creatinine Clearance: 41.7 mL/min (by C-G  formula based on SCr of 0.99 mg/dL).   LIVER Recent Labs  Lab 09/07/20 2320 09/07/20 2355 09/08/20 0740 09/09/20 0500 09/10/20 0500 09/12/20 0653  AST 608*  --  439* 147* 64* 25  ALT 297*  --  278* 167* 108* 64*  ALKPHOS 227*  --  184* 113 82 73  BILITOT 2.7*  --  3.7* 4.2* 3.0* 2.4*  PROT 7.8  --  6.9 6.2* 5.7* 6.9  ALBUMIN 3.8  --  3.2* 2.7* 2.4* 2.8*  INR  --  1.1 1.3*  --   --   --     INFECTIOUS Recent Labs  Lab 09/07/20 2355 09/08/20 0740 09/08/20 1043  LATICACIDVEN 1.5 1.3 2.1*  PROCALCITON  --  6.79  --     ENDOCRINE CBG (last 3)  Recent Labs    09/12/20 1200 09/12/20 1636 09/12/20 2133  GLUCAP 120* 90 97      CRITICAL CARE Performed by: Cristal Generous   Total critical care time: 40 minutes  Critical care time was exclusive of separately billable procedures and treating other patients. Critical care was necessary to treat or prevent imminent or life-threatening deterioration.  Critical care was time spent personally by me on the following activities: development of treatment plan with patient and/or surrogate as well as nursing, discussions with consultants, evaluation of patient's response to treatment, examination of patient, obtaining history from patient or surrogate, ordering and performing treatments and interventions, ordering and review of laboratory studies, ordering and review of radiographic studies, pulse oximetry and re-evaluation of  patient's condition.  Eliseo Gum MSN, AGACNP-BC [place] [phone] If no answer, [phone] 09/13/2020, 8:02 AM   Patient seen and examined. Mild nausea with meds this morning, now resolved. Soft Bps overnight, but no dizziness or malaise. Denies new complaints.  BP (!) 107/50   Pulse (!) 43   Temp (!) 97.3 F (36.3 C) (Oral) Comment: warm blankets applied  Resp (!) 21   Ht 5\' 7"  (1.702 m)   Wt 74.5 kg   SpO2 90%   BMI 25.72 kg/m  Elderly man laying in bed in NAD eating breakfast [place]/AT, eyes anicteric  reg rate and rhythm Mild tachypnea CTAB Mild foot edema, no pretibial or dependent edema Awake and alert, answering questions appropriately   coox 66% BUN 15 Cr 1.16  Assessment & plan: Acute on chronic respiratory failure with hypoxia, remains on 10L but 2L is home requirement Chronic right heart failure due to [place] -CXR today -coox & CVP -may need more aggressive diuresis -checking BNP tomorrow -pulmonary hygiene, OOB mobility -con't macitentan and sildenafil; will start NE if MAP <55 -needs ongoing ICU monitoring for low Bps while escalating PH vasodilator therapy.  Sepsis due to cholangitis -appreciate IR's assistance -con't pip-tazo    Julian Hy, DO 09/13/20 2:16 PM [facility]

## 2020-09-13 NOTE — Plan of Care (Signed)
RN will continue to monitor patient's progression of care plan.  

## 2020-09-14 ENCOUNTER — Inpatient Hospital Stay (HOSPITAL_COMMUNITY): Payer: Medicare Other

## 2020-09-14 ENCOUNTER — Other Ambulatory Visit (HOSPITAL_COMMUNITY): Payer: Medicare Other

## 2020-09-14 DIAGNOSIS — I9589 Other hypotension: Secondary | ICD-10-CM

## 2020-09-14 DIAGNOSIS — I270 Primary pulmonary hypertension: Secondary | ICD-10-CM

## 2020-09-14 DIAGNOSIS — I50813 Acute on chronic right heart failure: Secondary | ICD-10-CM

## 2020-09-14 LAB — CBC
HCT: 33.6 % — ABNORMAL LOW (ref 39.0–52.0)
Hemoglobin: 11.2 g/dL — ABNORMAL LOW (ref 13.0–17.0)
MCH: 32.3 pg (ref 26.0–34.0)
MCHC: 33.3 g/dL (ref 30.0–36.0)
MCV: 96.8 fL (ref 80.0–100.0)
Platelets: 249 10*3/uL (ref 150–400)
RBC: 3.47 MIL/uL — ABNORMAL LOW (ref 4.22–5.81)
RDW: 14.7 % (ref 11.5–15.5)
WBC: 7 10*3/uL (ref 4.0–10.5)
nRBC: 0 % (ref 0.0–0.2)

## 2020-09-14 LAB — BASIC METABOLIC PANEL
Anion gap: 8 (ref 5–15)
BUN: 18 mg/dL (ref 8–23)
CO2: 30 mmol/L (ref 22–32)
Calcium: 8 mg/dL — ABNORMAL LOW (ref 8.9–10.3)
Chloride: 96 mmol/L — ABNORMAL LOW (ref 98–111)
Creatinine, Ser: 1.31 mg/dL — ABNORMAL HIGH (ref 0.61–1.24)
GFR, Estimated: 50 mL/min — ABNORMAL LOW (ref >60)
Glucose, Bld: 88 mg/dL (ref 70–99)
Potassium: 3.4 mmol/L — ABNORMAL LOW (ref 3.5–5.1)
Sodium: 134 mmol/L — ABNORMAL LOW (ref 135–145)

## 2020-09-14 LAB — COOXEMETRY PANEL
Carboxyhemoglobin: 1.4 % (ref 0.5–1.5)
Methemoglobin: 0.7 % (ref 0.0–1.5)
O2 Saturation: 69.2 %
Total hemoglobin: 11.5 g/dL — ABNORMAL LOW (ref 12.0–16.0)

## 2020-09-14 LAB — BRAIN NATRIURETIC PEPTIDE: B Natriuretic Peptide: 1035.8 pg/mL — ABNORMAL HIGH (ref 0.0–100.0)

## 2020-09-14 MED ORDER — NOREPINEPHRINE 4 MG/250ML-% IV SOLN
4.0000 ug/min | INTRAVENOUS | Status: DC | PRN
  Administered 2020-09-14 – 2020-09-17 (×5): 4 ug/min via INTRAVENOUS
  Filled 2020-09-14 (×4): qty 250

## 2020-09-14 MED ORDER — TORSEMIDE 20 MG PO TABS
40.0000 mg | ORAL_TABLET | Freq: Two times a day (BID) | ORAL | Status: DC
  Administered 2020-09-14 – 2020-09-15 (×3): 40 mg via ORAL
  Filled 2020-09-14 (×4): qty 2

## 2020-09-14 MED ORDER — POTASSIUM CHLORIDE 20 MEQ PO PACK
40.0000 meq | PACK | Freq: Once | ORAL | Status: AC
  Administered 2020-09-14: 40 meq via ORAL
  Filled 2020-09-14: qty 2

## 2020-09-14 MED ORDER — POTASSIUM CHLORIDE CRYS ER 20 MEQ PO TBCR: 40.0000 meq | EXTENDED_RELEASE_TABLET | Freq: Once | ORAL | Status: DC

## 2020-09-14 MED ORDER — SILDENAFIL CITRATE 20 MG PO TABS
40.0000 mg | ORAL_TABLET | Freq: Three times a day (TID) | ORAL | Status: DC
  Administered 2020-09-14 – 2020-09-19 (×16): 40 mg via ORAL
  Filled 2020-09-14 (×17): qty 2

## 2020-09-14 NOTE — Progress Notes (Signed)
Referring Physician(s): Connor,C  Supervising Physician: Sandi Mariscal  Patient Status:  [name] - In-pt  Chief Complaint:  Cholecystitis  Subjective: Patient currently eating, reports no new complaints.   Allergies: Patient has no known allergies.  Medications: Prior to Admission medications   Medication Sig Start Date End Date Taking? Authorizing Provider  albuterol (PROVENTIL HFA;VENTOLIN HFA) 108 (90 Base) MCG/ACT inhaler Inhale 1 puff into the lungs 2 (two) times daily. 05/20/17  Yes Mannam, Praveen, MD  fluticasone (VERAMYST) 27.5 MCG/SPRAY nasal spray Place 1 spray into the nose daily as needed for rhinitis.    Yes [provider]  Fluticasone-Salmeterol,sensor, 113-14 MCG/ACT AEPB Inhale 1 puff into the lungs 2 (two) times daily.   Yes [provider]  levothyroxine (SYNTHROID, LEVOTHROID) 112 MCG tablet Take 112 mcg by mouth daily before breakfast.   Yes [provider]  loratadine (CLARITIN) 10 MG tablet Take 10 mg by mouth daily as needed for allergies.    Yes [provider]  macitentan (OPSUMIT) 10 MG tablet Take 1 tablet (10 mg total) by mouth daily. Needs appt for future refills 08/19/20  Yes Bensimhon, Shaune Pascal, MD  montelukast (SINGULAIR) 10 MG tablet Take 10 mg by mouth at bedtime.   Yes [provider]  pantoprazole (PROTONIX) 20 MG tablet Take 20 mg by mouth 2 (two) times daily.    Yes [provider]  simvastatin (ZOCOR) 10 MG tablet Take 10 mg by mouth at bedtime.   Yes [provider]  torsemide (DEMADEX) 20 MG tablet Take 2 tablets (40 mg total) by mouth daily. 03/03/20  Yes Bensimhon, Shaune Pascal, MD  ferrous sulfate 325 (65 FE) MG tablet Take 1 tablet (325 mg total) by mouth 2 (two) times daily with a meal. Patient not taking: Reported on 09/07/2020 01/28/20   Bensimhon, Shaune Pascal, MD  midodrine (PROAMATINE) 10 MG tablet Take 1 tablet (10 mg total) by mouth 3 (three) times daily. Patient not taking:  Reported on 09/07/2020 03/03/20   Bensimhon, Shaune Pascal, MD  potassium chloride 20 MEQ/15ML (10%) SOLN Take 15 mLs (20 mEq total) by mouth 2 (two) times daily. Patient not taking: Reported on 09/07/2020 12/23/19   [name], MD  sildenafil (REVATIO) 20 MG tablet Take 2 tablets (40 mg total) by mouth 3 (three) times daily. Patient not taking: Reported on 09/07/2020 10/21/19   [name], PA-C     Vital Signs: BP (!) 147/58   Pulse (!) 48   Temp (!) 97.5 F (36.4 C) (Oral)   Resp (!) 21   Ht 5\' 7"  (1.702 m)   Wt 164 lb 3.9 oz (74.5 kg)   SpO2 (!) 87%   BMI 25.72 kg/m   Physical Exam awake, alert.  Gallbladder drain intact, insertion site okay, nontender, output 650 cc green bile  Imaging: No results found.  Labs:  CBC: Recent Labs    09/09/20 0500 09/10/20 0500 09/12/20 0653 09/14/20 0515  WBC 11.9* 6.7 7.0 7.0  HGB 12.1* 11.0* 12.0* 11.2*  HCT 37.9* 34.4* 36.5* 33.6*  PLT 204 163 204 249    COAGS: Recent Labs    03/22/20 2008 09/07/20 2355 09/08/20 0740  INR 1.2 1.1 1.3*  APTT  --  32  --     BMP: Recent Labs    03/22/20 0042 03/22/20 0042 03/22/20 0426 03/22/20 0426 03/23/20 0542 03/23/20 0542 03/25/20 0514 09/07/20 2320 09/10/20 0500 09/12/20 0653 09/13/20 0825 09/14/20 0515  NA 140   < >  139   < > 138   < > 136   < > 135 135 135 134*  K 3.9   < > 3.9   < > 4.0   < > 4.2   < > 3.2* 3.2* 3.6 3.4*  CL 102   < > 105   < > 104   < > 105   < > 102 96* 95* 96*  CO2 28   < > 25   < > 23   < > 24   < > 24 27 29 30   GLUCOSE 112*   < > 107*   < > 103*   < > 96   < > 107* 89 89 88  BUN 23   < > 23   < > 28*   < > 25*   < > 24* 15 15 18   CALCIUM 8.6*   < > 8.0*   < > 8.1*   < > 8.0*   < > 7.8* 8.0* 7.7* 8.0*  CREATININE 1.39*   < > 1.24   < > 0.99   < > 0.97   < > 1.11 0.99 1.16 1.31*  GFRNONAA 43*   < > 49*   < > >60   < > >60   < > >60 >60 58* 50*  GFRAA 50*  --  57*  --  >60  --  >60  --   --   --   --   --    < > = values in this  interval not displayed.    LIVER FUNCTION TESTS: Recent Labs    09/08/20 0740 09/09/20 0500 09/10/20 0500 09/12/20 0653  BILITOT 3.7* 4.2* 3.0* 2.4*  AST 439* 147* 64* 25  ALT 278* 167* 108* 64*  ALKPHOS 184* 113 82 73  PROT 6.9 6.2* 5.7* 6.9  ALBUMIN 3.2* 2.7* 2.4* 2.8*    Assessment and Plan: Pt with hx sepsis/acute cholecystitis; s/p perc GB drain 11/18; afebrile; bradycardic; WBC nl; hgb 11.2(12), K 3.4; creat 1.31 (1.16); bile cx- enterococcus/enterobacter; CXR pend; cont current tx/drain irrigation; pt to keep drain at least 4-6 weeks unless GB removed surgically in interim; f/u cholangiogram in about 6 weeks or sooner if any problems arise   Electronically Signed: D. Rowe Robert, PA-C 09/14/2020, 12:43 PM   I spent a total of 15 minutes at the the patient's bedside AND on the patient's hospital floor or unit, greater than 50% of which was counseling/coordinating care for gallbladder drain    Patient ID: Vincent Robinson, male   DOB: [birth year], 86 y.o.   MRN: [id number]

## 2020-09-14 NOTE — TOC Progression Note (Signed)
Transition of Care [facility]) - Progression Note    Patient Details  Name: Vincent Robinson MRN: [id number] Date of Birth: [birth year]  Transition of Care [facility]) CM/SW Contact  [name], RN Phone Number: 09/14/2020, 8:18 AM  Clinical Narrative:    [place] Hospital Events   11/18 Admit with abd pain, concern for acute cholecystitis  11/19 Remains on levophed 10 mcg, drain in place, ? AF on monitor / EKG pending hfnc at 15l/min, iv levophed,iv zosyn, bld cultures x2 x4days=neg, bnp-1035.8 wbc 7.0 Plan following for poss dc needs hope is to return home once infection cleared. Following for progression.  Expected Discharge Plan: Home/Self Care Barriers to Discharge: No Barriers Identified  Expected Discharge Plan and Services Expected Discharge Plan: Home/Self Care   Discharge Planning Services: CM Consult   Living arrangements for the past 2 months: Single Family Home                                       Social Determinants of Health (SDOH) Interventions    Readmission Risk Interventions Readmission Risk Prevention Plan 03/25/2020 11/24/2019  Transportation Screening Complete Complete  PCP or Specialist Appt within 5-7 Days - Patient refused  Home Care Screening - Complete  Medication Review (RN CM) - Referral to Pharmacy  HRI or Home Care Consult Patient refused -  Palliative Care Screening Not Applicable -  Medication Review (RN Care Manager) Complete -  Some recent data might be hidden

## 2020-09-14 NOTE — Progress Notes (Signed)
CVP 22 at this time; Dr. Carlis Abbott notified by this RN. No new orders since pt has been receiving scheduled diuretics.

## 2020-09-14 NOTE — Progress Notes (Signed)
K+ 3.4 Replaced per protocol  

## 2020-09-14 NOTE — Progress Notes (Signed)
NAME:  Vincent Robinson, MRN:  [id number], DOB:  05/23/1923, LOS: 6 ADMISSION DATE:  09/07/2020, CONSULTATION DATE:  09/08/20 REFERRING MD:  Dr April Pulumbo, CHIEF COMPLAINT:  Septic shock from cholecystitis  Brief History   86 y/o M admitted 11/17 with abdominal pain.  Work up concerning for septic shock secondary to acute cholecystitis, acute hypoxic resp failure.   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 AF on monitor / EKG pending  Consults:  Surgery IR  Procedures:  Perc bili drain  Significant Diagnostic Tests:  11/18 CT ABD/Pelvis w Contrast >> acute cholecystitis, 2.3 CM enhancing urothelial carcinoma within the right posterior bladder lumen in the region of the right ureterovesicular junction  11/18 CTA Chest 11/8 >> negative for PE, morphologic changes of pulmonary arterial hypertension, elevated right heart pressures, some degree of right heart failure, stable 4.5 cm thoracic aortic aneurysm, patchy ground-glass infiltrate in LUL 11/18 ECHO >> LVEF 55-60%, moderate asymetric LV hypertrophy of the basal-septal segment, grade II diastolic dysfunction, elevated LA pressure, interventricular septum is flattened in systole and diastole consistent with RV pressure overload, RV systolic function moderately reduced, RV severely enlarged, RV systolic pressure 81, moderate to severe TR  Micro Data:  COVID 11/18 >> negative  Influenza 11/18 >> negative BCx2 11/17 >> 1/2 with GPC's >> Staph hominis and staph lugdunesis U. Strep Antigen 11/18 >> negative  Legionella 11/18 >>    UC 11/18 >>  Gallbladder Culture 11/18 >> moderate GPC, few GPR, rare GNR >> Enterococcus faecalis, enterobacter aerogenes Blood culture 11/21>  Antimicrobials:  Vanco 11/17 >> 11/17 Cefepime 11/17 >> 11/18 Zosyn 11/18 >>   Interim history/subjective:  coox 11/23 66%  Remains off of pressors overnight O2 requirement has increased from 10 to 12L   BNP 1035 Cr 1.31 from 1.16  Objective   Blood pressure (!) 82/43, pulse 65, temperature (!) 97.3 F (36.3 C), temperature source Oral, resp. rate (!) 21, height 5\' 7"  (1.702 m), weight 74.5 kg, SpO2 90 %.        Intake/Output Summary (Last 24 hours) at 09/14/2020 1025 Last data filed at 09/14/2020 0800 Gross per 24 hour  Intake 752.6 ml  Output 1300 ml  Net -547.4 ml   Filed Weights   09/10/20 0500 09/11/20 0416 09/12/20 0607  Weight: 75.7 kg 74.6 kg 74.5 kg    Examination: General: Chronically ill, frail appearing elderly M, reclined in bed NAD  HEENT: NCAT anicteric sclera. Poor dentition, trachea midline  Neuro: AAOx3 following commands. No focal deficits  CV:s1s2 no rgm cap refill < 3 seconds  PULM: Symmetrical chest expansion, even unlabored respirations. No adventitious sounds  GI: Perc bili drain with  Extremities: No deformity. BLE in compression stockings. RUE PICC Skin: thin, dry, no rash   Resolved Hospital Problem list   Shock  Hyperglycemia  Assessment & Plan:   Sepsis due to acute cholecystitis  due to Enterococcus and Enterobacter On midodrine PTA -repeat BCx 11/20 without growth (11/17 BCx w staph likely contaminant)  P -s/p perc GB drain with IR 11/18 --  cont for 4-6wk  -cont zosyn - 14d course  -cont midodrine  -OP surgery follow up in approx 1 month -Cholangiogram in approx 6wk  -cont PTA midodrine   Hypotension -cardiac component + restarting pulm HTN meds Chronic HFpEF and Cor Pulmonale (sees Dr Jeffie Pollock as an outpatient) -BNP 1035 on 11/24  -suspect driven by R heart, but possible L  heart P -increasing demadex to BID -ECHO -will start a low dose of levophed -- will aim to maintain a fixed dose of NE, instead of titrating.  -MAP goal > 55   Severe Pulmonary HTN - chronic Emphysema  Post Inflammatory ILD  Acute on Chronic Hypoxic Respiratory Failure  OSA on CPAP -increasing O2 requirement P -pending CXR -supplemental O2 to support sats >88% -Dulera, Duoneb -continue sildenafil TID (currently at reduced dose) -opsumit  -Patient requires ongoing ICU care due to concern for hypotension as we restart pulmonary vasodilators -MAP goal > 55, plan to start fixed dose of levo and continue pulm HTN meds  -CPAP QHS  Hypokalemia  P -trend -replace PRN   Suspected Urothelial Cancer (noted on CT in June 2021) P -will need Urology consult prior to discharge   Hypothyroidism  TSH 0.827 on admit  P -continue synthroid   Chronic anemia currently Hb above baseline P -monitor  -transfuse for Hb <7 or hemodynamically significant bleeding  Nausea pt feels due to taking midodrine without food P -snack w midodrine -PRN anti emetic   Best practice:  Diet: regular diet Pain/Anxiety/Delirium protocol (if indicated): n/a VAP protocol (if indicated): n/a DVT prophylaxis: lovenox  GI prophylaxis: PPI Glucose control: monitor  Mobility: PT, OT  Code Status: full code   Family Communication: pt updated 11/24 Disposition: ICU  LABS    PULMONARY Recent Labs  Lab 09/13/20 1111  O2SAT 66.3    CBC Recent Labs  Lab 09/10/20 0500 09/12/20 0653 09/14/20 0515  HGB 11.0* 12.0* 11.2*  HCT 34.4* 36.5* 33.6*  WBC 6.7 7.0 7.0  PLT 163 204 249    COAGULATION Recent Labs  Lab 09/07/20 2355 09/08/20 0740  INR 1.1 1.3*    CARDIAC  No results for input(s): TROPONINI in the last 168 hours. No results for input(s): PROBNP in the last 168 hours.   CHEMISTRY Recent Labs  Lab 09/08/20 0740 09/08/20 0740 09/09/20 0500 09/09/20 0500 09/10/20 0500  09/10/20 0500 09/12/20 0653 09/12/20 0653 09/13/20 0825 09/14/20 0515  NA 136   < > 133*  --  135  --  135  --  135 134*  K 3.6   < > 3.8   < > 3.2*   < > 3.2*   < > 3.6 3.4*  CL 102   < > 103  --  102  --  96*  --  95* 96*  CO2 24   < > 22  --  24  --  27  --  29 30  GLUCOSE 129*   < > 118*  --  107*  --  89  --  89 88  BUN 25*   < > 23  --  24*  --  15  --  15 18  CREATININE 1.19   < > 0.97  --  1.11  --  0.99  --  1.16 1.31*  CALCIUM 7.9*   < > 7.9*  --  7.8*  --  8.0*  --  7.7* 8.0*  MG 2.0  --  1.7  --   --   --   --   --  1.8  --   PHOS 4.2  --  3.8  --   --   --   --   --   --   --    < > = values in this interval not displayed.   Estimated Creatinine Clearance: 31.5 mL/min (A) (by C-G formula based on SCr of 1.31 mg/dL (H)).   LIVER Recent Labs  Lab 09/07/20 2320 09/07/20 2355 09/08/20 0740 09/09/20 0500 09/10/20 0500 09/12/20 0653  AST 608*  --  439* 147* 64* 25  ALT 297*  --  278* 167* 108* 64*  ALKPHOS 227*  --  184* 113 82 73  BILITOT 2.7*  --  3.7* 4.2* 3.0* 2.4*  PROT 7.8  --  6.9 6.2* 5.7* 6.9  ALBUMIN 3.8  --  3.2* 2.7* 2.4* 2.8*  INR  --  1.1 1.3*  --   --   --     INFECTIOUS Recent Labs  Lab 09/07/20 2355 09/08/20 0740 09/08/20 1043  LATICACIDVEN 1.5 1.3 2.1*  PROCALCITON  --  6.79  --     ENDOCRINE CBG (last 3)  Recent Labs    09/12/20 1636 09/12/20 2133 09/13/20 0818  GLUCAP 90 97 85   CRITICAL CARE Performed by: Cristal Generous   Total critical care time: 38 minutes  Critical care time was exclusive of separately billable procedures and treating other patients. Critical care was necessary to treat or prevent imminent or life-threatening deterioration.  Critical care was time spent personally by me on the following activities: development of treatment plan with patient and/or surrogate as well as nursing, discussions with consultants, evaluation of patient's response to treatment, examination of patient, obtaining history from  patient or surrogate, ordering and performing treatments and interventions, ordering and review of laboratory studies, ordering and review of radiographic studies, pulse oximetry and re-evaluation of patient's condition.   Eliseo Gum MSN, AGACNP-BC [place] [phone] If no answer, [phone] 09/14/2020, 10:25 AM

## 2020-09-14 NOTE — Progress Notes (Signed)
Occupational Therapy Treatment Patient Details Name: Vincent Robinson MRN: [id number] DOB: 1922/12/31 Today's Date: 09/14/2020    History of present illness 86 year old with asthma, ILD, pulmonary hypertension, OSA, CABG Admitted 09/07/20  with septic shock, acute cholecystitis. S/p perc chole drain placement by IR 11/18   OT comments  Patient highly motivated "I'm very active, I like to be up and about." Min G for safety to sit up at EOB with increase time for recovery to monitor BP. Patient was min A to stand from EOB with cues for hand placement and side step with RW for stability. After seated exercise and recovery break patient min A to stand from EOB and walk forward/back ~43ft in total reports legs feel "a little wobbly." At end of session educate patient on bed level exercises to maintain strength, pt verbalize understanding "I do that throughout the day." Continue to recommend [place] at D/C   BP sitting 85/40 (54) BP sitting after side stepping HOB 99/43 (61) BP standing 84/48 (59)  Patient mildly symptomatic with initial sitting up at EOB, denies dizziness in standing just "wobbly legs"   Follow Up Recommendations  Home health OT;Supervision/Assistance - 24 hour    Equipment Recommendations  None recommended by OT       Precautions / Restrictions Precautions Precautions: Fall Precaution Comments: R flank drain, monitor vitals       Mobility Bed Mobility Overal bed mobility: Needs Assistance Bed Mobility: Supine to Sit;Sit to Supine     Supine to sit: Min guard Sit to supine: Mod assist   General bed mobility comments: mod A to lift LEs back into bed   Transfers Overall transfer level: Needs assistance Equipment used: Rolling walker (2 wheeled) Transfers: Sit to/from Stand Sit to Stand: Min assist;+2 safety/equipment         General transfer comment: please see toilet transfer in ADL section    Balance Overall balance assessment: Needs  assistance Sitting-balance support: No upper extremity supported Sitting balance-Leahy Scale: Good     Standing balance support: Bilateral upper extremity supported Standing balance-Leahy Scale: Poor Standing balance comment: reliant on RW                           ADL either performed or assessed with clinical judgement   ADL Overall ADL's : Needs assistance/impaired                         Toilet Transfer: Minimal assistance;Comfort height toilet;RW Toilet Transfer Details (indicate cue type and reason): cue for hand placement to push from bed, patient min A for steadying to side step to [facility]. after seated rest, patient min A to stand and take steps forward/back from EOB approx 6 ft in total with cues to reach back with sitting          Functional mobility during ADLs: Minimal assistance;Rolling walker                 Cognition Arousal/Alertness: Awake/alert Behavior During Therapy: WFL for tasks assessed/performed Overall Cognitive Status: Within Functional Limits for tasks assessed                                 General Comments: AxO x 3 pleasant/motivated        Exercises Exercises: General Lower Extremity General Exercises - Lower Extremity Ankle Circles/Pumps: Both;10 reps;Supine;AROM Heel Slides: AROM;Both;5  reps;Supine Hip Flexion/Marching: Strengthening;10 reps;Seated      General Comments O2 improved with sitting EOB and in standing maintaining in low 90s on 15L, upon arrival patient in mid 12s.     Pertinent Vitals/ Pain       Pain Assessment: No/denies pain         Frequency  Min 2X/week        Progress Toward Goals  OT Goals(current goals can now be found in the care plan section)  Progress towards OT goals: Progressing toward goals  Acute Rehab OT Goals Patient Stated Goal: to go home. visit his wife OT Goal Formulation: With patient Time For Goal Achievement: 09/25/20 Potential to Achieve Goals:  Good ADL Goals Pt Will Transfer to Toilet: with supervision;ambulating;regular height toilet;grab bars Pt Will Perform Toileting - Clothing Manipulation and hygiene: with supervision;sit to/from stand Additional ADL Goal #1: Patient will perform 10 min functional activity or exercise activity as evidence of improving activity tolerance  Plan Discharge plan remains appropriate       AM-PAC OT "6 Clicks" Daily Activity     Outcome Measure   Help from another person eating meals?: A Little Help from another person taking care of personal grooming?: A Little Help from another person toileting, which includes using toliet, bedpan, or urinal?: A Little Help from another person bathing (including washing, rinsing, drying)?: A Little Help from another person to put on and taking off regular upper body clothing?: A Little Help from another person to put on and taking off regular lower body clothing?: A Lot 6 Click Score: 17    End of Session Equipment Utilized During Treatment: Rolling walker;Oxygen  OT Visit Diagnosis: Unsteadiness on feet (R26.81)   Activity Tolerance Patient tolerated treatment well   Patient Left in bed;with call bell/phone within reach;with bed alarm set   Nurse Communication Mobility status        Time: 1017-5102 OT Time Calculation (min): 27 min  Charges: OT General Charges $OT Visit: 1 Visit OT Treatments $Self Care/Home Management : 23-37 mins  Delbert Phenix OT OT pager: [phone]   Rosemary Holms 09/14/2020, 2:29 PM

## 2020-09-15 ENCOUNTER — Inpatient Hospital Stay (HOSPITAL_COMMUNITY): Payer: Medicare Other

## 2020-09-15 DIAGNOSIS — I361 Nonrheumatic tricuspid (valve) insufficiency: Secondary | ICD-10-CM

## 2020-09-15 DIAGNOSIS — I351 Nonrheumatic aortic (valve) insufficiency: Secondary | ICD-10-CM

## 2020-09-15 DIAGNOSIS — I50811 Acute right heart failure: Secondary | ICD-10-CM

## 2020-09-15 DIAGNOSIS — I9589 Other hypotension: Secondary | ICD-10-CM

## 2020-09-15 DIAGNOSIS — R579 Shock, unspecified: Secondary | ICD-10-CM

## 2020-09-15 LAB — CULTURE, BLOOD (ROUTINE X 2)
Culture: NO GROWTH
Culture: NO GROWTH
Special Requests: ADEQUATE
Special Requests: ADEQUATE

## 2020-09-15 LAB — ECHOCARDIOGRAM LIMITED
Area-P 1/2: 3.63 cm2
Calc EF: 68.5 %
Height: 67 in
S' Lateral: 3.7 cm
Single Plane A2C EF: 72.5 %
Single Plane A4C EF: 63.8 %
Weight: 2557.34 oz

## 2020-09-15 LAB — BASIC METABOLIC PANEL
Anion gap: 11 (ref 5–15)
BUN: 15 mg/dL (ref 8–23)
CO2: 29 mmol/L (ref 22–32)
Calcium: 7.9 mg/dL — ABNORMAL LOW (ref 8.9–10.3)
Chloride: 95 mmol/L — ABNORMAL LOW (ref 98–111)
Creatinine, Ser: 1.21 mg/dL (ref 0.61–1.24)
GFR, Estimated: 55 mL/min — ABNORMAL LOW (ref >60)
Glucose, Bld: 115 mg/dL — ABNORMAL HIGH (ref 70–99)
Potassium: 3 mmol/L — ABNORMAL LOW (ref 3.5–5.1)
Sodium: 135 mmol/L (ref 135–145)

## 2020-09-15 LAB — COOXEMETRY PANEL
Carboxyhemoglobin: 1.4 % (ref 0.5–1.5)
Methemoglobin: 0.6 % (ref 0.0–1.5)
O2 Saturation: 76 %
Total hemoglobin: 12.8 g/dL (ref 12.0–16.0)

## 2020-09-15 LAB — BRAIN NATRIURETIC PEPTIDE: B Natriuretic Peptide: 1215.7 pg/mL — ABNORMAL HIGH (ref 0.0–100.0)

## 2020-09-15 MED ORDER — SENNA 8.6 MG PO TABS
1.0000 | ORAL_TABLET | Freq: Once | ORAL | Status: AC
  Administered 2020-09-15: 8.6 mg via ORAL
  Filled 2020-09-15: qty 1

## 2020-09-15 MED ORDER — POTASSIUM CHLORIDE 20 MEQ PO PACK
40.0000 meq | PACK | Freq: Once | ORAL | Status: AC
  Administered 2020-09-15: 40 meq via ORAL
  Filled 2020-09-15: qty 2

## 2020-09-15 MED ORDER — FUROSEMIDE 10 MG/ML IJ SOLN
40.0000 mg | Freq: Once | INTRAMUSCULAR | Status: AC
  Administered 2020-09-15: 40 mg via INTRAVENOUS
  Filled 2020-09-15: qty 4

## 2020-09-15 MED ORDER — PERFLUTREN LIPID MICROSPHERE
1.0000 mL | INTRAVENOUS | Status: AC | PRN
  Filled 2020-09-15: qty 10

## 2020-09-15 MED ORDER — PERFLUTREN LIPID MICROSPHERE: 1.0000 mL | INTRAVENOUS | Status: DC | PRN

## 2020-09-15 MED ORDER — POLYETHYLENE GLYCOL 3350 17 G PO PACK
17.0000 g | PACK | Freq: Every day | ORAL | Status: DC
  Filled 2020-09-15: qty 1

## 2020-09-15 MED ORDER — POTASSIUM CHLORIDE 10 MEQ/50ML IV SOLN
10.0000 meq | INTRAVENOUS | Status: AC
  Administered 2020-09-15 (×4): 10 meq via INTRAVENOUS
  Filled 2020-09-15 (×4): qty 50

## 2020-09-15 NOTE — Plan of Care (Signed)
  Problem: Respiratory: Goal: Levels of oxygenation will improve Outcome: Progressing   

## 2020-09-15 NOTE — Progress Notes (Signed)
NAME:  Vincent Robinson, MRN:  [id number], DOB:  [birth year], LOS: 7 ADMISSION DATE:  09/07/2020, CONSULTATION DATE:  09/08/20 REFERRING MD:  Dr April Pulumbo, CHIEF COMPLAINT:  Septic shock from cholecystitis  Brief History   86 y/o M admitted 11/17 with abdominal pain.  Work up concerning for septic shock secondary to acute cholecystitis, acute hypoxic resp failure.   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 AF on monitor / EKG pending  Consults:  Surgery IR  Procedures:  Perc bili drain  Significant Diagnostic Tests:  11/18 CT ABD/Pelvis w Contrast >> acute cholecystitis, 2.3 CM enhancing urothelial carcinoma within the right posterior bladder lumen in the region of the right ureterovesicular junction  11/18 CTA Chest 11/8 >> negative for PE, morphologic changes of pulmonary arterial hypertension, elevated right heart pressures, some degree of right heart failure, stable 4.5 cm thoracic aortic aneurysm, patchy ground-glass infiltrate in LUL 11/18 ECHO >> LVEF 55-60%, moderate asymetric LV hypertrophy of the basal-septal segment, grade II diastolic dysfunction, elevated LA pressure, interventricular septum is flattened in systole and diastole consistent with RV pressure overload, RV systolic function moderately reduced, RV severely enlarged, RV systolic pressure 81, moderate to severe TR  Micro Data:  COVID 11/18 >> negative  Influenza 11/18 >> negative BCx2 11/17 >> 1/2 with GPC's >> Staph hominis and staph lugdunesis U. Strep Antigen 11/18 >> negative  Legionella 11/18 >>    UC 11/18 >>  Gallbladder Culture 11/18 >> moderate GPC, few GPR, rare GNR >> Enterococcus faecalis, enterobacter aerogenes Blood culture 11/21> NG, final  Antimicrobials:  Vanco 11/17 >> 11/17 Cefepime 11/17 >> 11/18 Zosyn 11/18 >>   Interim history/subjective:  He had some transient abdominal pain this morning that has resolved. Hasn't had a BM in several days. He denies new complaints.  Objective   Blood pressure (!) 104/50, pulse (!) 31, temperature (!) 97.4 F (36.3 C), temperature source Oral, resp. rate 19, height 5\' 7"  (1.702 m), weight 72.5 kg, SpO2 98 %. CVP:  [6 mmHg-22 mmHg] 6 mmHg      Intake/Output Summary (Last 24 hours) at 09/15/2020 0844 Last data filed at 09/15/2020 0536 Gross per 24 hour  Intake 1652.54 ml  Output 2575 ml  Net -922.46 ml   Filed Weights   09/11/20 0416 09/12/20 0607 09/15/20 0500  Weight: 74.6 kg 74.5 kg 72.5 kg    Examination: General: Frail appearing elderly man sitting up in bed no acute distress HEENT: [place]/AT, eyes anicteric, oral mucosa moist Neuro: Awake and alert, answering questions appropriately, moving all extremities spontaneously, globally weak. CV: Regular rhythm, bradycardic PULM: Breathing comfortably on 10L, mild desaturations when talking for a prolonged period GI: perc bili drain with dark thin output Extremities: RUE PICC, no significant edema Skin: thin, dry, no rashes  Resolved Hospital Problem list   Shock  Hyperglycemia  Assessment & Plan:   Sepsis due to acute cholecystitis  due to Enterococcus and Enterobacter On midodrine PTA -repeat BCx 11/20 without growth (11/17 BCx w staph likely contaminant)  P -s/p perc GB drain with  IR 11/18 -- cont for 4-6wk  -cont zosyn - 14d course planned -cont midodrine; increased this admission to uptitrate [place] meds -OP surgery follow up in approx 1 month -Cholangiogram in approx 6wk   Hypotension -cardiac component + restarting pulm HTN meds Chronic HFpEF and Cor  Pulmonale (sees Dr Jeffie Pollock as an outpatient) -BNP 1035 on 11/24  -suspect driven by R heart, but possible L heart P -increasing demadex to BID; goal net negative ~ 1L today -repeat ECHO ordered to reevaluate R heart -will start a low dose of levophed -- will aim to maintain a fixed dose of NE -MAP goal > 55   Severe Pulmonary HTN - chronic Emphysema  Post Inflammatory ILD  Acute on Chronic Hypoxic Respiratory Failure  OSA on CPAP Acute pulmonary edena -increasing O2 requirement P -supplemental O2 to support sats >88% -Dulera, Duoneb -continue sildenafil TID; increased to full dose on 11/24 -Con't opsumit 10mg  daily  -Patient requires ongoing ICU care due to concern for hypotension as we restart pulmonary vasodilators -MAP goal > 55, plan to start fixed dose of levo and continue pulm HTN meds  -CPAP QHS  Hypokalemia  P -con't to trend -repleted    Suspected Urothelial Cancer (noted on CT in June 2021) P -will need Urology consult prior to discharge   Hypothyroidism  TSH 0.827 on admit  P -continue synthroid   Chronic anemia currently Hb above baseline P -con't to monitor periodically -transfuse for Hb <7 or hemodynamically significant bleeding  Constipation -Daily MiraLAX w/ hold parameters -Senna today  Best practice:  Diet: regular diet Pain/Anxiety/Delirium protocol (if indicated): n/a VAP protocol (if indicated): n/a DVT prophylaxis: lovenox  GI prophylaxis: PPI Glucose control: monitor  Mobility: PT, OT  Code Status: full code   Family Communication: pt updated 11/25 Disposition: ICU  LABS    PULMONARY Recent Labs  Lab 09/13/20 1111 09/14/20 1029  O2SAT 66.3 69.2    CBC Recent Labs  Lab 09/10/20 0500 09/12/20 0653 09/14/20 0515  HGB 11.0* 12.0* 11.2*  HCT 34.4* 36.5* 33.6*  WBC 6.7 7.0 7.0  PLT 163 204 249    COAGULATION No results for input(s): INR in the last 168 hours.  CARDIAC  No results for input(s): TROPONINI in the  last 168 hours. No results for input(s): PROBNP in the last 168 hours.   CHEMISTRY Recent Labs  Lab 09/09/20 0500 09/09/20 0500 09/10/20 0500 09/10/20 0500 09/12/20 0653 09/12/20 0653 09/13/20 0825 09/13/20 0825 09/14/20 0515 09/15/20 0527  NA 133*   < > 135  --  135  --  135  --  134* 135  K 3.8   < > 3.2*   < > 3.2*   < > 3.6   < > 3.4* 3.0*  CL 103   < > 102  --  96*  --  95*  --  96* 95*  CO2 22   < > 24  --  27  --  29  --  30 29  GLUCOSE 118*   < > 107*  --  89  --  89  --  88 115*  BUN 23   < > 24*  --  15  --  15  --  18 15  CREATININE 0.97   < > 1.11  --  0.99  --  1.16  --  1.31* 1.21  CALCIUM 7.9*   < > 7.8*  --  8.0*  --  7.7*  --  8.0* 7.9*  MG  1.7  --   --   --   --   --  1.8  --   --   --   PHOS 3.8  --   --   --   --   --   --   --   --   --    < > = values in this interval not displayed.   Estimated Creatinine Clearance: 34.1 mL/min (by C-G formula based on SCr of 1.21 mg/dL).   LIVER Recent Labs  Lab 09/09/20 0500 09/10/20 0500 09/12/20 0653  AST 147* 64* 25  ALT 167* 108* 64*  ALKPHOS 113 82 73  BILITOT 4.2* 3.0* 2.4*  PROT 6.2* 5.7* 6.9  ALBUMIN 2.7* 2.4* 2.8*    INFECTIOUS Recent Labs  Lab 09/08/20 1043  LATICACIDVEN 2.1*    ENDOCRINE CBG (last 3)  Recent Labs    09/12/20 1636 09/12/20 2133 09/13/20 0818  GLUCAP 90 97 85     This patient is critically ill with multiple organ system failure which requires frequent high complexity decision making, assessment, support, evaluation, and titration of therapies. This was completed through the application of advanced monitoring technologies and extensive interpretation of multiple databases. During this encounter critical care time was devoted to patient care services described in this note for 35 minutes.  Julian Hy, DO 09/15/20 9:29 AM [facility]

## 2020-09-15 NOTE — Progress Notes (Signed)
  Echocardiogram 2D Echocardiogram limited with deifnity has been performed.  [name] M 09/15/2020, 12:27 PM

## 2020-09-16 DIAGNOSIS — I2721 Secondary pulmonary arterial hypertension: Secondary | ICD-10-CM | POA: Diagnosis not present

## 2020-09-16 DIAGNOSIS — I2609 Other pulmonary embolism with acute cor pulmonale: Secondary | ICD-10-CM

## 2020-09-16 DIAGNOSIS — K819 Cholecystitis, unspecified: Secondary | ICD-10-CM

## 2020-09-16 DIAGNOSIS — K810 Acute cholecystitis: Secondary | ICD-10-CM | POA: Diagnosis not present

## 2020-09-16 LAB — BASIC METABOLIC PANEL
Anion gap: 11 (ref 5–15)
Anion gap: 11 (ref 5–15)
BUN: 13 mg/dL (ref 8–23)
BUN: 13 mg/dL (ref 8–23)
CO2: 26 mmol/L (ref 22–32)
CO2: 23 mmol/L (ref 22–32)
Calcium: 7.6 mg/dL — ABNORMAL LOW (ref 8.9–10.3)
Calcium: 7.6 mg/dL — ABNORMAL LOW (ref 8.9–10.3)
Chloride: 96 mmol/L — ABNORMAL LOW (ref 98–111)
Chloride: 100 mmol/L (ref 98–111)
Creatinine, Ser: 1.08 mg/dL (ref 0.61–1.24)
Creatinine, Ser: 0.95 mg/dL (ref 0.61–1.24)
GFR, Estimated: >60 mL/min (ref >60)
GFR, Estimated: >60 mL/min (ref >60)
Glucose, Bld: 114 mg/dL — ABNORMAL HIGH (ref 70–99)
Glucose, Bld: 114 mg/dL — ABNORMAL HIGH (ref 70–99)
Potassium: 2.8 mmol/L — ABNORMAL LOW (ref 3.5–5.1)
Potassium: 3.2 mmol/L — ABNORMAL LOW (ref 3.5–5.1)
Sodium: 133 mmol/L — ABNORMAL LOW (ref 135–145)
Sodium: 134 mmol/L — ABNORMAL LOW (ref 135–145)

## 2020-09-16 LAB — GLUCOSE, CAPILLARY: Glucose-Capillary: 119 mg/dL — ABNORMAL HIGH (ref 70–99)

## 2020-09-16 LAB — COOXEMETRY PANEL
Carboxyhemoglobin: 1.6 % — ABNORMAL HIGH (ref 0.5–1.5)
Methemoglobin: 0.5 % (ref 0.0–1.5)
O2 Saturation: 97.9 %
Total hemoglobin: 13.5 g/dL (ref 12.0–16.0)

## 2020-09-16 LAB — BRAIN NATRIURETIC PEPTIDE: B Natriuretic Peptide: 370.2 pg/mL — ABNORMAL HIGH (ref 0.0–100.0)

## 2020-09-16 MED ORDER — POTASSIUM CHLORIDE 10 MEQ/50ML IV SOLN
INTRAVENOUS | Status: AC
  Filled 2020-09-16: qty 50

## 2020-09-16 MED ORDER — SENNA 8.6 MG PO TABS: 1.0000 | ORAL_TABLET | Freq: Every evening | ORAL | Status: DC | PRN

## 2020-09-16 MED ORDER — POTASSIUM CHLORIDE 10 MEQ/50ML IV SOLN
10.0000 meq | INTRAVENOUS | Status: AC
  Administered 2020-09-16 (×4): 10 meq via INTRAVENOUS
  Filled 2020-09-16 (×5): qty 50

## 2020-09-16 MED ORDER — SENNA 8.6 MG PO TABS: 1.0000 | ORAL_TABLET | Freq: Every day | ORAL | Status: DC

## 2020-09-16 MED ORDER — CHLORHEXIDINE GLUCONATE 0.12 % MT SOLN
OROMUCOSAL | Status: AC
  Filled 2020-09-16: qty 15

## 2020-09-16 MED ORDER — MAGNESIUM SULFATE 2 GM/50ML IV SOLN
2.0000 g | Freq: Once | INTRAVENOUS | Status: AC
  Administered 2020-09-16: 2 g via INTRAVENOUS
  Filled 2020-09-16: qty 50

## 2020-09-16 MED ORDER — POTASSIUM CHLORIDE CRYS ER 20 MEQ PO TBCR
40.0000 meq | EXTENDED_RELEASE_TABLET | Freq: Once | ORAL | Status: AC
  Administered 2020-09-16: 40 meq via ORAL
  Filled 2020-09-16: qty 2

## 2020-09-16 MED ORDER — FUROSEMIDE 10 MG/ML IJ SOLN
40.0000 mg | Freq: Two times a day (BID) | INTRAMUSCULAR | Status: DC
  Filled 2020-09-16: qty 4

## 2020-09-16 MED ORDER — POTASSIUM CHLORIDE 10 MEQ/50ML IV SOLN
10.0000 meq | INTRAVENOUS | Status: AC
  Administered 2020-09-16 (×4): 10 meq via INTRAVENOUS
  Filled 2020-09-16 (×4): qty 50

## 2020-09-16 MED ORDER — POLYETHYLENE GLYCOL 3350 17 G PO PACK: 17.0000 g | PACK | Freq: Every day | ORAL | Status: DC | PRN

## 2020-09-16 MED ORDER — TORSEMIDE 20 MG PO TABS
40.0000 mg | ORAL_TABLET | Freq: Every day | ORAL | Status: DC
  Administered 2020-09-17: 40 mg via ORAL
  Filled 2020-09-16: qty 2

## 2020-09-16 NOTE — Progress Notes (Signed)
Patient transferred to [facility] from [facility] via [place].  Alert and oriented x4.  Privacy word set up.  Called patient's daughter Hilda Blades to inform of transfer, she requested a call from the doctor for update on the plan of care, Dr. Haroldine Laws notified.  Patient denies any further needs at this time.

## 2020-09-16 NOTE — Progress Notes (Signed)
PT Cancellation Note  Patient Details Name: Vincent Robinson MRN: [id number] DOB: Apr 11, 1923   Cancelled Treatment:     PT deferred this pm - transport in room to transfer to [facility].  [facility]     Epsie Walthall 09/16/2020, 2:06 PM

## 2020-09-16 NOTE — Progress Notes (Signed)
K+2.8 Replaced per protocol

## 2020-09-16 NOTE — TOC Progression Note (Signed)
Transition of Care [facility]) - Progression Note    Patient Details  Name: [name] MRN: [id number] Date of Birth: 20-Jan-1923  Transition of Care [facility]) CM/SW Contact  [name], RN Phone Number: 09/16/2020, 9:14 AM  Clinical Narrative:    [facility] Events   11/18 Admit with abd pain, concern for acute cholecystitis  11/19 Remains on levophed 10 mcg, drain in place, ? AF on monitor / EKG pending    Expected Discharge Plan: Home/Self Care Barriers to Discharge: No Barriers Identified  Expected Discharge Plan and Services Expected Discharge Plan: Home/Self Care   Discharge Planning Services: CM Consult   Living arrangements for the past 2 months: Single Family Home                                       Social Determinants of Health (SDOH) Interventions    Readmission Risk Interventions Readmission Risk Prevention Plan 03/25/2020 11/24/2019  Transportation Screening Complete Complete  PCP or Specialist Appt within 5-7 Days - Patient refused  Home Care Screening - Complete  Medication Review (RN CM) - Referral to Pharmacy  HRI or Home Care Consult Patient refused -  Palliative Care Screening Not Applicable -  Medication Review (RN Care Manager) Complete -  Some recent data might be hidden

## 2020-09-16 NOTE — Plan of Care (Signed)

## 2020-09-16 NOTE — Progress Notes (Signed)
NAME:  Vincent Robinson, MRN:  [id number], DOB:  October 11, 1923, LOS: 8 ADMISSION DATE:  09/07/2020, CONSULTATION DATE:  09/08/20 REFERRING MD:  Dr April Pulumbo, CHIEF COMPLAINT:  Septic shock from cholecystitis  Brief History   86 y/o M admitted 11/17 with abdominal pain.  Work up concerning for septic shock secondary to acute cholecystitis, acute hypoxic resp failure.   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 AF on monitor / EKG pending  Consults:  Surgery IR  Procedures:  Perc bili drain  Significant Diagnostic Tests:  11/18 CT ABD/Pelvis w Contrast >> acute cholecystitis, 2.3 CM enhancing urothelial carcinoma within the right posterior bladder lumen in the region of the right ureterovesicular junction  11/18 CTA Chest 11/8 >> negative for PE, morphologic changes of pulmonary arterial hypertension, elevated right heart pressures, some degree of right heart failure, stable 4.5 cm thoracic aortic aneurysm, patchy ground-glass infiltrate in LUL 11/18 ECHO >> LVEF 55-60%, moderate asymetric LV hypertrophy of the basal-septal segment, grade II diastolic dysfunction, elevated LA pressure, interventricular septum is flattened in systole and diastole consistent with RV pressure overload, RV systolic function moderately reduced, RV severely enlarged, RV systolic pressure 81, moderate to severe TR  ECHO 11/25> LVEF 60-65%, normal LV function with mild LVH. Grade II diastolic dysfunction. Elevated LA pressure.  RV systolic function is moderately reduced. RV enlarged, severely elevated pulm  artery systolic pressure. RVSP 85.9 mmHg.  Mild MVR Severe TVR Severe AV calcification and thickening, mild AVR  Micro Data:  COVID 11/18 >> negative  Influenza 11/18 >> negative BCx2 11/17 >> 1/2 with GPC's >> Staph hominis and staph lugdunesis U. Strep Antigen 11/18 >> negative  Legionella 11/18 >>  UC 11/18 >>  Gallbladder Culture 11/18 >> moderate GPC, few GPR, rare GNR >> Enterococcus faecalis, enterobacter aerogenes Blood culture 11/21> NG, final  Antimicrobials:  Vanco 11/17 >> 11/17 Cefepime 11/17 >> 11/18 Zosyn 11/18 >>   Interim history/subjective:  K replaced this morning 3.7 L UOP in last 24 hours with increased diuretic reg  Continues on fixed dose NE  Tolerating home doses of PH meds   O2 requirements still quite above baseline, is on 10L HFNC this morning. SpO2 ranging from 87-97% on this today.   Objective   Blood pressure (!) 100/50, pulse (!) 43, temperature 97.8 F (36.6 C), temperature source Oral, resp. rate 19, height 5\' 7"  (1.702 m), weight 71.7 kg, SpO2 (!) 87 %. CVP:  [9 mmHg-10 mmHg] 10 mmHg      Intake/Output Summary (Last 24 hours) at 09/16/2020 0920 Last data filed at 09/16/2020 0651 Gross per 24 hour  Intake 1467.94 ml  Output 4400 ml  Net -2932.06 ml   Filed Weights   09/12/20 0607 09/15/20 0500 09/16/20 0452  Weight: 74.5 kg 72.5 kg 71.7 kg    Examination: General: Frail, elderly appearing M, reclined in bed NAD   HEENT: [place]/AT, eyes anicteric, oral mucosa moist Neuro: AAOx3 following commands. Globally weak/ deconditioned  CV: Reg rhythm, bradycardic rate. S1s2. Cap refill < 3 seconds  PULM: Symmetrical chest  expansion, no accessory use on 10L HFNC.  GI: Per bili drain with brown bilious output. Site clean and dry Extremities: No obvious joint deformity. RUE PICC. No edema  Skin: thin, clean. Dry with flaking BLE  Psych: Good insight. Pleasant mood. Engaged. Appropriate for age and situation   Resolved Hospital Problem list     Shock  Hyperglycemia   Assessment & Plan:   Sepsis due to acute cholecystitis  due to Enterococcus and Enterobacter On midodrine PTA -repeat BCx 11/20 without growth (11/17 BCx w staph likely contaminant)  P -s/p perc GB drain with IR 11/18 -- cont for 4-6wk  -cont zosyn - 14d course planned -cont midodrine; increased this admission to uptitrate PH meds -OP surgery follow up in approx 1 month -Cholangiogram in approx 6wk   Severe Pulmonary HTN - chronic Emphysema  Post Inflammatory ILD  Acute on Chronic Hypoxic Respiratory Failure  OSA on CPAP  Acute pulmonary edena -increasing O2 requirement P -Plan to transfer to CVICU at [facility] 11/26 for ongoing management of [place]. To remain on PCCM service as primary -supplemental O2 to support sats >88%. Ok to increase this during mobility efforts  -Ruthe Mannan, Duoneb -continue sildenafil TID; increased to full dose on 11/24 -continue opsumit 10mg  daily  -MAP goal > 55, plan to start fixed dose of levo and continue pulm HTN meds  -CPAP QHS  Hypotension -cardiac component + restarting pulm HTN meds Chronic HFpEF and Cor Pulmonale (sees Dr Jeffie Pollock as an outpatient) -BNP 1215 on 11/25, received additional Lasix -repeat ECHO without significant changes from prior -- LVEF 60-65%, Grade II LV diastolic dysfunction.  Enlarged RV, RVSP 85.9  -coox continues to up-trend on fixed NE  P -continue BID demadex -PRN additional Lasix -will start a low dose of levophed -- will aim to maintain a fixed dose of NE to allow diuresis + tolerance of PH meds  -MAP goal > 55  -Midodrine 15mg  TID with meals  -Notify Adv HF when gets to [facility] CVICU   Hypokalemia  P -con't to trend -repleted 11/26   Suspected Urothelial Cancer (noted on CT in June 2021) P -will need Urology consult prior to discharge   Hypothyroidism  TSH 0.827 on admit  P -continue synthroid   Chronic anemia currently Hb above baseline P -con't to monitor  periodically -transfuse for Hb <7 or hemodynamically significant bleeding  Constipation -Daily MiraLAX w/ hold parameters -Senna   Best practice:  Diet: regular diet Pain/Anxiety/Delirium protocol (if indicated): n/a VAP protocol (if indicated): n/a DVT prophylaxis: lovenox  GI prophylaxis: PPI Glucose control: monitor  Mobility: PT, OT  Code Status: full code   Family Communication: pt updated 11/26, daughter [name] updated 11/26.  Disposition: Transferring to [facility] CVICU 11/26. Will remain on PCCM service   [place]  Lab 09/13/20 1111 09/14/20 1029 09/15/20 0952  O2SAT 66.3 69.2 76.0    CBC Recent Labs  Lab 09/10/20 0500 09/12/20 0653 09/14/20 0515  HGB 11.0* 12.0* 11.2*  HCT 34.4* 36.5* 33.6*  WBC 6.7 7.0 7.0  PLT 163 204 249    COAGULATION No results for input(s): INR in the last 168 hours.  CARDIAC  No results for input(s): TROPONINI in the last 168 hours. No results for input(s): PROBNP in the last 168 hours.   CHEMISTRY Recent Labs  Lab 09/12/20 0786 09/12/20 7544 09/13/20 0825 09/13/20 0825 09/14/20 0515 09/14/20 0515 09/15/20 0527 09/16/20 0350  NA 135  --  135  --  134*  --  135 133*  K 3.2*   < > 3.6   < > 3.4*   < > 3.0* 2.8*  CL 96*  --  95*  --  96*  --  95* 96*  CO2 27  --  29  --  30  --  29 26  GLUCOSE 89  --  89  --  88  --  115* 114*  BUN 15  --  15  --  18  --  15 13  CREATININE 0.99  --  1.16  --  1.31*  --  1.21 1.08  CALCIUM 8.0*  --  7.7*  --  8.0*  --  7.9* 7.6*  MG  --   --  1.8  --   --   --   --   --    < > = values in this interval not displayed.   Estimated Creatinine Clearance: 38.3 mL/min (by C-G formula based on SCr of 1.08 mg/dL).   LIVER Recent Labs  Lab 09/10/20 0500 09/12/20 0653  AST 64* 25  ALT 108* 64*  ALKPHOS 82 73  BILITOT 3.0* 2.4*  PROT 5.7* 6.9  ALBUMIN 2.4* 2.8*    INFECTIOUS No results for input(s): LATICACIDVEN, PROCALCITON in the last 168  hours.  ENDOCRINE CBG (last 3)  No results for input(s): GLUCAP in the last 72 hours.  CRITICAL CARE Performed by: Cristal Generous   Total critical care time: 37 minutes  Critical care time was exclusive of separately billable procedures and treating other patients. Critical care was necessary to treat or prevent imminent or life-threatening deterioration.  Critical care was time spent personally by me on the following activities: development of treatment plan with patient and/or surrogate as well as nursing, discussions with consultants, evaluation of patient's response to treatment, examination of patient, obtaining history from patient or surrogate, ordering and performing treatments and interventions, ordering and review of laboratory studies, ordering and review of radiographic studies, pulse oximetry and re-evaluation of patient's condition.  Eliseo Gum MSN, AGACNP-BC [place] [phone] If no answer, [phone] 09/16/2020, 9:20 AM

## 2020-09-16 NOTE — Consult Note (Signed)
ADVANCED HF TEAM CONSULT NOTE  Patient ID: Vincent Robinson MRN: [id number]; DOB: [birth year]  Admit date: 09/07/2020 Date of Consult: 09/16/2020  Primary Care Provider: Katherina Mires, MD Primary Cardiologist: Quay Burow, MD / Glori Bickers, MD Primary Electrophysiologist:  None   Referring: Dr. Carlis Abbott (CCM)  History of Present Illness:   Vincent Robinson is a 86 year old male with mild ILD, diastolic HF, moderate AS, PAF, PAH on pulmonary vasodilators and chronic hypoxic respiratory failure on 2L O2. We are asked to consult for help with management of PAH and volume overload by Dr. Carlis Abbott.    He was admitted on 11/17 with septic shock requiring vasopressors and his pulmonary vasodilators to be held. Found to have acute cholecystitis and underwent perc tube by IR on 11/18 with good response.  He was able to be weaned off vasopressors and slowly had his sildenafil and macitentan added back. Has been on NE to support his BP. Coox has remained reassuring (76%) but echo confirms ongoing acute on chronic right heart failure.  Echo 09/15/20: EF 60% RV moderately dilated with mod-severe RV dysfunction. Sever TR. RVSP ~38mmHG. AoV severely calcified with reduced motion. No gradient measured.   Says he is feeling better. Denies SOB, orthopnea or PND. Weight has trended down 164 -> 158 (last weight in clinic was 165 lbs)  Review of Systems: [y] = yes, [ ]  = no    General: Weight gain [] ; Weight loss Blue.Reese ]; Anorexia Blue.Reese ]; Fatigue [y]; Fever [ ] ; Chills [ ] ; Weakness Blue.Reese ]   Cardiac: Chest pain/pressure [ ] ; Resting SOB [ y]; Exertional SOB [y]; Orthopnea [ ] ; Pedal Edema [y]; Palpitations [ ] ; Syncope [ ] ; Presyncope [ ] ; Paroxysmal nocturnal dyspnea[ ]    Pulmonary: Cough [ y]; Wheezing[ y]; Hemoptysis[ ] ; Sputum [ ] ; Snoring Blue.Reese ]   GI: Vomiting[ ] ; Dysphagia[ ] ; Melena[ ] ; Hematochezia [ ] ; Heartburn[ ] ; Abdominal pain Blue.Reese ]; Constipation [ ] ; Diarrhea [ ] ; BRBPR [ ]    GU: Hematuria[ ] ;  Dysuria [ ] ; Nocturia[ ]   Vascular: Pain in legs with walking [ ] ; Pain in feet with lying flat [ ] ; Non-healing sores [ ] ; Stroke [ ] ; TIA [ ] ; Slurred speech [ ] ;   Neuro: Headaches[ ] ; Vertigo[ ] ; Seizures[ ] ; Paresthesias[ ] ;Blurred vision [ ] ; Diplopia [ ] ; Vision changes [ ]    Ortho/Skin: Arthritis [y]; Joint pain [y]; Muscle pain [ ] ; Joint swelling [ ] ; Back Pain [ y]; Rash [ ]    Psych: Depression[ ] ; Anxiety[ ]    Heme: Bleeding problems [ ] ; Clotting disorders [ ] ; Anemia [ ]    Endocrine: Diabetes [ ] ; Thyroid dysfunction[ ]         Past Medical History:  Diagnosis Date  . AAA (abdominal aortic aneurysm) ([place])   . Acute respiratory failure ([place]) 11/23/2019  . Asthma   . Basal cell carcinoma   . Chronic respiratory failure ([place])   . COPD (chronic obstructive pulmonary disease) ([place])   . Coronary artery disease   . History of chronic respiratory failure   . Hyperlipidemia   . Hypothyroidism   . Right heart failure ([place])   . Skin cancer 2017  . Sleep apnea     Past Surgical History:  Procedure Laterality Date  . bleeding ulcer  2016  . BYPASS AXILLA/BRACHIAL ARTERY  1998  . colon bleed  2012  . COLONOSCOPY N/A 06/07/2017   Procedure: COLONOSCOPY;  Surgeon: Carol Ada, MD;  Location: [facility];  Service: Endoscopy;  Laterality: N/A;  . DEBRIDMENT OF DECUBITUS ULCER  2002  . IR ANGIOGRAM SELECTIVE EACH ADDITIONAL VESSEL  03/22/2020  . IR ANGIOGRAM VISCERAL SELECTIVE  03/22/2020  . IR PERC CHOLECYSTOSTOMY  09/08/2020  . IR US GUIDE VASC ACCESS RIGHT  03/22/2020  . RIGHT HEART CATH N/A 12/11/2017   Procedure: RIGHT HEART CATH;  Surgeon: Jolaine Artist, MD;  Location: [facility];  Service: Cardiovascular;  Laterality: N/A;  . THORACIC AORTA STENT  2009     Home Medications:  Prior to Admission medications   Medication Sig Start Date End Date Taking? Authorizing Provider  albuterol (PROVENTIL HFA;VENTOLIN HFA) 108 (90 Base) MCG/ACT inhaler Inhale 1  puff into the lungs 2 (two) times daily. 05/20/17  Yes Mannam, Praveen, MD  aspirin (ASPIRIN 81) 81 MG chewable tablet Chew 1 tablet (81 mg total) by mouth daily. Restart in 1 week following hospitalization Patient taking differently: Chew 81 mg by mouth See admin instructions. Three times a week 07/14/19  Yes [place] Eric J, DO  fluticasone (VERAMYST) 27.5 MCG/SPRAY nasal spray Place 1 spray into the nose daily as needed for rhinitis.    Yes [provider]  Fluticasone-Salmeterol 113-14 MCG/ACT AEPB INHALE 1 PUFF TWICE A DAY Patient taking differently: Inhale 1 puff into the lungs in the morning and at bedtime.  03/12/18  Yes Mannam, Praveen, MD  furosemide (LASIX) 40 MG tablet Take 1 tablet (40 mg total) by mouth 2 (two) times daily. 12/16/19  Yes Lorretta Harp, MD  levofloxacin (LEVAQUIN) 500 MG tablet Take 500 mg by mouth daily. For 7 days   Yes [provider]  levothyroxine (SYNTHROID, LEVOTHROID) 112 MCG tablet Take 112 mcg by mouth daily before breakfast.   Yes [provider]  loratadine (CLARITIN) 10 MG tablet Take 10 mg by mouth daily as needed for allergies.    Yes [provider]  macitentan (OPSUMIT) 10 MG tablet Take 1 tablet (10 mg total) by mouth daily. 09/14/19  Yes Towana Stenglein, Shaune Pascal, MD  midodrine (PROAMATINE) 5 MG tablet Take 1 tablet (5 mg total) by mouth 3 (three) times daily with meals. 11/25/19 12/25/19 Yes Dahal, Marlowe Aschoff, MD  montelukast (SINGULAIR) 10 MG tablet TAKE ONE TABLET BY MOUTH AT BEDTIME Patient taking differently: Take 10 mg by mouth at bedtime.  03/02/19  Yes Mannam, Praveen, MD  pantoprazole (PROTONIX) 40 MG tablet Take 1 tablet (40 mg total) by mouth 2 (two) times daily. 11/25/19 12/25/19 Yes Dahal, Marlowe Aschoff, MD  sildenafil (REVATIO) 20 MG tablet Take 2 tablets (40 mg total) by mouth 3 (three) times daily. 10/21/19  Yes Lyda Jester M, PA-C  simvastatin (ZOCOR) 10 MG tablet Take 10 mg by mouth at bedtime.   Yes [provider]    Inpatient Medications: Scheduled Meds: . Chlorhexidine Gluconate Cloth  6 each Topical Daily  . enoxaparin (LOVENOX) injection  30 mg Subcutaneous Daily  . furosemide  40 mg Intravenous BID  . ipratropium-albuterol  3 mL Nebulization BID  . ketotifen  1 drop Right Eye BID  . levothyroxine  100 mcg Oral Q0600  . macitentan  10 mg Oral Daily  . mouth rinse  15 mL Mouth Rinse BID  . midodrine  15 mg Oral TID PC  . mometasone-formoterol  2 puff Inhalation BID  . pantoprazole  40 mg Oral Q24H  . potassium chloride  40 mEq Oral Once  . sildenafil  40 mg Oral TID  . sodium chloride flush  10-40 mL Intracatheter Q12H  . sodium chloride flush  5 mL Intracatheter Q8H   Continuous Infusions: . sodium chloride 10 mL/hr at 09/15/20 0400  . norepinephrine (LEVOPHED) Adult infusion 4 mcg/min (09/16/20 0618)  . piperacillin-tazobactam (ZOSYN)  IV Stopped (09/16/20 0953)  . potassium chloride 10 mEq (09/16/20 1058)   PRN Meds: docusate sodium, HYDROcodone-acetaminophen, norepinephrine (LEVOPHED) Adult infusion, ondansetron (ZOFRAN) IV, polyethylene glycol, senna, sodium chloride flush  Allergies:   No Known Allergies  Social History:   Social History   Socioeconomic History  . Marital status: Married    Spouse name: Not on file  . Number of children: Not on file  . Years of education: Not on file  . Highest education level: Not on file  Occupational History  . Not on file  Tobacco Use  . Smoking status: Former Smoker    Packs/day: 0.50    Quit date: 08/06/1961    Years since quitting: 59.1  . Smokeless tobacco: Never Used  Vaping Use  . Vaping Use: Never used  Substance and Sexual Activity  . Alcohol use: Not Currently  . Drug use: Never  . Sexual activity: Not on file  Other Topics Concern  . Not on file  Social History Narrative  . Not on file   Social Determinants of Health   Financial Resource Strain:   . Difficulty of Paying Living Expenses: Not on  file  Food Insecurity:   . Worried About Charity fundraiser in the Last Year: Not on file  . Ran Out of Food in the Last Year: Not on file  Transportation Needs:   . Lack of Transportation (Medical): Not on file  . Lack of Transportation (Non-Medical): Not on file  Physical Activity:   . Days of Exercise per Week: Not on file  . Minutes of Exercise per Session: Not on file  Stress:   . Feeling of Stress : Not on file  Social Connections:   . Frequency of Communication with Friends and Family: Not on file  . Frequency of Social Gatherings with Friends and Family: Not on file  . Attends Religious Services: Not on file  . Active Member of Clubs or Organizations: Not on file  . Attends Archivist Meetings: Not on file  . Marital Status: Not on file  Intimate Partner Violence:   . Fear of Current or Ex-Partner: Not on file  . Emotionally Abused: Not on file  . Physically Abused: Not on file  . Sexually Abused: Not on file    Family History:    Family History  Problem Relation Age of Onset  . Emphysema Mother   . Stroke Father   . Movement disorder Sister   . Aneurysm Brother        Thoracic aorta  . Macular degeneration Brother      Physical Exam/Data:   Vitals:   09/16/20 0615 09/16/20 0630 09/16/20 0800 09/16/20 0918  BP: 130/60 (!) 100/50    Pulse: (!) 53 (!) 43    Resp: 15 19    Temp:   97.8 F (36.6 C)   TempSrc:   Oral   SpO2: 93% (!) 87%  92%  Weight:      Height:        Intake/Output Summary (Last 24 hours) at 09/16/2020 1227 Last data filed at 09/16/2020 0651 Gross per 24 hour  Intake 987.94 ml  Output 3500 ml  Net -2512.06 ml   Last 3 Weights 09/16/2020 09/15/2020 09/12/2020  Weight (lbs) 158 lb 1.1 oz 159 lb 13.3 oz 164 lb 3.9 oz  Weight (kg) 71.7 kg 72.5 kg 74.5 kg     Body mass index is 24.76 kg/m.  General:  Elderly male lying in bed. No resp difficulty  Neck: supple. JVP t6-7 Carotids 2+ bilat;+ bruits. No lymphadenopathy or  thryomegaly appreciated. Cor: PMI nondisplaced. Regular rate & rhythm. 2/6 AS s2 diminished. 2/6 TR Lungs: mild crackles Abdomen: soft, nontender, nondistended. No hepatosplenomegaly. No bruits or masses. Good bowel sounds. + c-tube Extremities: no cyanosis, clubbing, rash, no edema +xerosis Neuro: alert & orientedx3, cranial nerves grossly intact. moves all 4 extremities w/o difficulty. Affect pleasant   EKG:  NSR 64 1AVB RBBB No ST-T wave abnormalities. Personally reviewed   Telemetry:  Sinus brady 50s Personally reviewed  Relevant CV Studies:   Right Heart Catheterization 12/11/2017: Findings: RA = 6 RV = 72/7 PA = 73/27 (44) PCW = 10 Fick cardiac output/index = 4.1/2.1 PVR = 8.4 WU Ao sat = 91% PA sat = 58%, 58%  Assessment: 1. Moderate PAH with RV strain likely WHO Group I   _______________   Laboratory Data:  High Sensitivity Troponin:   Recent Labs  Lab 09/07/20 2320 09/08/20 0027  TROPONINIHS 31* 20*     Chemistry Recent Labs  Lab 09/14/20 0515 09/15/20 0527 09/16/20 0350  NA 134* 135 133*  K 3.4* 3.0* 2.8*  CL 96* 95* 96*  CO2 30 29 26   GLUCOSE 88 115* 114*  BUN 18 15 13   CREATININE 1.31* 1.21 1.08  CALCIUM 8.0* 7.9* 7.6*  GFRNONAA 50* 55* >60  ANIONGAP 8 11 11     Recent Labs  Lab 09/10/20 0500 09/12/20 0653  PROT 5.7* 6.9  ALBUMIN 2.4* 2.8*  AST 64* 25  ALT 108* 64*  ALKPHOS 82 73  BILITOT 3.0* 2.4*   Hematology Recent Labs  Lab 09/10/20 0500 09/12/20 0653 09/14/20 0515  WBC 6.7 7.0 7.0  RBC 3.45* 3.75* 3.47*  HGB 11.0* 12.0* 11.2*  HCT 34.4* 36.5* 33.6*  MCV 99.7 97.3 96.8  MCH 31.9 32.0 32.3  MCHC 32.0 32.9 33.3  RDW 15.3 14.8 14.7  PLT 163 204 249   BNP Recent Labs  Lab 09/14/20 0515 09/15/20 0527  BNP 1,035.8* 1,215.7*    DDimer No results for input(s): DDIMER in the last 168 hours.   Radiology/Studies:  DG Chest 2 View  Result Date: 09/14/2020 CLINICAL DATA:  Hypoxia. EXAM: CHEST - 2 VIEW COMPARISON:   September 08, 2020. FINDINGS: Stable cardiomegaly. Sternotomy wires are noted. No pneumothorax is noted. Stable bilateral lung opacities are noted concerning for pulmonary edema, with probable small right pleural effusion. Bony thorax is unremarkable. IMPRESSION: Stable bilateral lung opacities concerning for pulmonary edema, with probable small right pleural effusion. Electronically Signed   By: Marijo Conception M.D.   On: 09/14/2020 12:50   ECHOCARDIOGRAM LIMITED  Result Date: 09/15/2020    ECHOCARDIOGRAM LIMITED REPORT   Patient Name:   Vincent Robinson Date of Exam: 09/15/2020 Medical Rec #:  [id number]      Height:       67.0 in Accession #:    [id number]     Weight:       159.8 lb Date of Birth:  06/18/1923       BSA:          1.838 m Patient Age:    89 years       BP:  100/38 mmHg Patient Gender: M              HR:           53 bpm. Exam Location:  Inpatient Procedure: Limited Color Doppler, Cardiac Doppler, Limited Echo and Intracardiac            Opacification Agent Indications:    Hypotension [[id number]]  History:        Patient has prior history of Echocardiogram examinations, most                 recent 09/08/2020. CHF, Pulmonary HTN and COPD, Aortic Valve                 Disease; Risk Factors:Sleep Apnea, Former Smoker and                 Dyslipidemia. AAA s/p Thoracic Stent. Asthma. Hypothyroid.                 Chronic Hypoxic Respiratory Failure.  Sonographer:    [name] RDCS Referring Phys: [phone] [place]  1. Left ventricular ejection fraction, by estimation, is 60 to 65%. The left ventricle has normal function. There is mild left ventricular hypertrophy. Left ventricular diastolic parameters are consistent with Grade II diastolic dysfunction (pseudonormalization). Elevated left atrial pressure.  2. Right ventricular systolic function is moderately reduced. The right ventricular size is moderately enlarged. There is severely elevated pulmonary artery systolic pressure.  The estimated right ventricular systolic pressure is 24.2 mmHg.  3. Left atrial size was mildly dilated.  4. Right atrial size was moderately dilated.  5. The mitral valve is degenerative. Mild mitral valve regurgitation.  6. Tricuspid valve regurgitation is severe.  7. The aortic valve is calcified. There is severe calcifcation of the aortic valve. There is severe thickening of the aortic valve. Aortic valve regurgitation is mild.  8. The inferior vena cava is dilated in size with <50% respiratory variability, suggesting right atrial pressure of 15 mmHg. Comparison(s): No significant change from prior study. Prior images reviewed side by side. FINDINGS  Left Ventricle: Left ventricular ejection fraction, by estimation, is 60 to 65%. The left ventricle has normal function. Definity contrast agent was given IV to delineate the left ventricular endocardial borders. There is mild left ventricular hypertrophy. Left ventricular diastolic parameters are consistent with Grade II diastolic dysfunction (pseudonormalization). Elevated left atrial pressure. Right Ventricle: The right ventricular size is moderately enlarged. Right ventricular systolic function is moderately reduced. There is severely elevated pulmonary artery systolic pressure. The tricuspid regurgitant velocity is 4.21 m/s, and with an assumed right atrial pressure of 15 mmHg, the estimated right ventricular systolic pressure is 68.3 mmHg. Left Atrium: Left atrial size was mildly dilated. Right Atrium: Right atrial size was moderately dilated. Mitral Valve: The mitral valve is degenerative in appearance. There is mild thickening of the mitral valve leaflet(s). There is mild calcification of the mitral valve leaflet(s). Mild mitral annular calcification. Mild mitral valve regurgitation. Tricuspid Valve: The tricuspid valve is not well visualized. Tricuspid valve regurgitation is severe. Aortic Valve: The aortic valve is calcified. There is severe calcifcation  of the aortic valve. There is severe thickening of the aortic valve. Aortic valve regurgitation is mild. Pulmonic Valve: The pulmonic valve was not well visualized. Pulmonic valve regurgitation is mild. Venous: The inferior vena cava is dilated in size with less than 50% respiratory variability, suggesting right atrial pressure of 15 mmHg. LEFT VENTRICLE PLAX 2D LVIDd:  4.70 cm     Diastology LVIDs:         3.70 cm     LV e' medial:    5.22 cm/s LV PW:         1.30 cm     LV E/e' medial:  17.4 LV IVS:        1.30 cm     LV e' lateral:   10.10 cm/s                            LV E/e' lateral: 9.0  LV Volumes (MOD) LV vol d, MOD A2C: 69.8 ml LV vol d, MOD A4C: 79.2 ml LV vol s, MOD A2C: 19.2 ml LV vol s, MOD A4C: 28.7 ml LV SV MOD A2C:     50.6 ml LV SV MOD A4C:     79.2 ml LV SV MOD BP:      50.8 ml RIGHT VENTRICLE TAPSE (M-mode): 1.4 cm LEFT ATRIUM         Index LA diam:    4.40 cm 2.39 cm/m  AORTIC VALVE LVOT Vmax:   75.10 cm/s LVOT Vmean:  55.800 cm/s LVOT VTI:    0.165 m MITRAL VALVE               TRICUSPID VALVE MV Area (PHT): 3.63 cm    TR Peak grad:   70.9 mmHg MV Decel Time: 209 msec    TR Vmax:        421.00 cm/s MV E velocity: 90.80 cm/s MV A velocity: 61.30 cm/s  SHUNTS MV E/A ratio:  1.48        Systemic VTI: 0.16 m Candee Furbish MD Electronically signed by Candee Furbish MD Signature Date/Time: 09/15/2020/1:27:33 PM    Final    Assessment and Plan:   1. Severe Pulmonary Hypertension with Cor Pulmonale/ Acute on Chronic Diastolic CHF - Suspect mainly WHO Group I based on w/u - echo as above with RVSP 46mmHG - back on macitentan and sildenafil 40 tid. Can cut sildenafil back as needed. - co-ox ok - wean NE as tolerated.  - volume status looks pretty good. Suspect may be as dry as we are going to get him. Will check CVP. If < 10 will stop IV lasix, Need to avoid over diuresis with RV failure. (home dose is torsemide 40 daily)  2. Acute cholecystitis - s/p pecutaneous C-tube - improved.  Managed by primary team  3. Acute on chronic hypoxic respiratory failure on home O2 - multifactorial: PAH/diastolic HF/Interstitial Lung Disease/COPD/Asthma - improving - mobilize. continue pulmonary toilet  4. CAD s/p Remote CABG in 1998 - no s/s ischemia - will need to resume ASA/statin  5. Paroxysmal Atrial Fibrillation - now in sinus brady - Not on chronic anticoagulation due to history of severe diverticular bleeds requiring transfusions.   6. Moderate Aortic Stenosis - Echo 09/08/20 MG 20 mmHg, AVA 1.45 cm^2, DI 0.29   7. Thoracic Aortic Aneurysm - CTA in 10/2019 showed 4.5cm ascending thoracic aortic aneurysm. Semi-annual imaging recommended at that time.  8. AAA - History endoluminal stent graft in 07/2008 (in [place]). - Most recent ultrsound in 09/2017 showed patent aorta and iliac stent with abnormal dilatation of the mid and distal abdominal aorta (largest measurement 2.9 cm). - Due to repeat imaging. Can be done as outpatient.   9. Hypokalemia - supp aggressively. Can add spiro as needed.    For questions or updates, please  contact [place] Please consult [email or web address] for contact info under     Signed, Glori Bickers, MD  09/16/2020 12:27 PM

## 2020-09-16 NOTE — Progress Notes (Signed)
Pharmacy Consult for Pulmonary Hypertension Treatment   Indication - Continuation of prior to admission medication   Patient is 86 y.o.  with history of PAH on chronic Macitentan (Opsumit) PTA and will be continued while hospitalized.   Continuing this medication order as an inpatient requires that monitoring parameters per REMS requirements must be met.  1. Chronic therapy is under the supervision of Dr Haroldine Laws who is enrolled in the REMS program and is being notified of continuation of therapy. A staff message in EPIC has been sent notifying the certified prescriber.  2. Per patient report has previously been educated on Hepatotoxicity . On admission pregnancy risk has been assessed and no monitoring required. 3. Hepatic function has been evaluated. AST/ALT appropriate to continue medication at this time.  Hepatic Function Latest Ref Rng & Units 09/12/2020 09/10/2020 09/09/2020  Total Protein 6.5 - 8.1 g/dL 6.9 5.7(L) 6.2(L)  Albumin 3.5 - 5.0 g/dL 2.8(L) 2.4(L) 2.7(L)  AST 15 - 41 U/L 25 64(H) 147(H)  ALT 0 - 44 U/L 64(H) 108(H) 167(H)  Alk Phosphatase 38 - 126 U/L 73 82 113  Total Bilirubin 0.3 - 1.2 mg/dL 2.4(H) 3.0(H) 4.2(H)  Bilirubin, Direct 0.0 - 0.2 mg/dL - - -   If any question arise or pregnancy is identified during hospitalization, contact for bosentan and macitentan: [phone]; ambrisentan: [phone].  Thank for you allowing [place] to participate in the care of this patient.  Antonietta Jewel, PharmD, [place] Clinical Pharmacist  Phone: [phone] 09/16/2020 3:29 PM  Please check AMION for all [place] phone numbers After 10:00 PM, call [place] [phone]

## 2020-09-16 NOTE — Progress Notes (Signed)
Referring Physician(s): Vincent Robinson,C  Supervising Physician: Jacqulynn Robinson  Patient Status:  Vincent Robinson - In-pt  Chief Complaint:  cholecystitis  Subjective: Pt resting quietly, no acute c/o; daughter in room; awaiting transfer to [facility] for management of [place]; denies sig abd pain,N/V   Allergies: Patient has no known allergies.  Medications: Prior to Admission medications   Medication Sig Start Date End Date Taking? Authorizing Provider  albuterol (PROVENTIL HFA;VENTOLIN HFA) 108 (90 Base) MCG/ACT inhaler Inhale 1 puff into the lungs 2 (two) times daily. 05/20/17  Yes Vincent Robinson, Prave[place], Robinson  fluticasone (VERAMYST) 27.5 MCG/SPRAY nasal spray Place 1 spray into the nose daily as needed for rhinitis.    Yes Vincent Robinson  Fluticasone-Salmeterol,sensor, 113-14 MCG/ACT AEPB Inhale 1 puff into the lungs 2 (two) times daily.   Yes Vincent Robinson  levothyroxine (SYNTHROID, LEVOTHROID) 112 MCG tablet Take 112 mcg by mouth daily before breakfast.   Yes Vincent Robinson  loratadine (CLARITIN) 10 MG tablet Take 10 mg by mouth daily as needed for allergies.    Yes Vincent Robinson  macitentan (OPSUMIT) 10 MG tablet Take 1 tablet (10 mg total) by mouth daily. Needs appt for future refills 08/19/20  Yes Vincent Robinson  montelukast (SINGULAIR) 10 MG tablet Take 10 mg by mouth at bedtime.   Yes Vincent Robinson  pantoprazole (PROTONIX) 20 MG tablet Take 20 mg by mouth 2 (two) times daily.    Yes Vincent Robinson  simvastatin (ZOCOR) 10 MG tablet Take 10 mg by mouth at bedtime.   Yes Vincent Robinson  torsemide (DEMADEX) 20 MG tablet Take 2 tablets (40 mg total) by mouth daily. 03/03/20  Yes Vincent Robinson  ferrous sulfate 325 (65 FE) MG tablet Take 1 tablet (325 mg total) by mouth 2 (two) times daily with a meal. Patient not taking: Reported on 09/07/2020 01/28/20   Vincent Robinson  midodrine (PROAMATINE) 10 MG tablet Take 1  tablet (10 mg total) by mouth 3 (three) times daily. Patient not taking: Reported on 09/07/2020 03/03/20   Vincent Robinson  potassium chloride 20 MEQ/15ML (10%) SOLN Take 15 mLs (20 mEq total) by mouth 2 (two) times daily. Patient not taking: Reported on 09/07/2020 12/23/19   Vincent Robinson  sildenafil (REVATIO) 20 MG tablet Take 2 tablets (40 mg total) by mouth 3 (three) times daily. Patient not taking: Reported on 09/07/2020 10/21/19   Vincent Robinson     Vital Signs: BP (!) 100/50   Pulse (!) 43   Temp 97.9 F (36.6 C) (Oral)   Resp 19   Ht 5\' 7"  (1.702 m)   Wt 158 lb 1.1 oz (71.7 kg)   SpO2 92%   BMI 24.76 kg/m   PE: awake/alert; GB drain intact, insertion site ok, not sig tender, OP 700 cc golden bile  Imaging: DG Chest 2 View  Result Date: 09/14/2020 CLINICAL DATA:  Hypoxia. EXAM: CHEST - 2 VIEW COMPARISON:  September 08, 2020. FINDINGS: Stable cardiomegaly. Sternotomy wires are noted. No pneumothorax is noted. Stable bilateral lung opacities are noted concerning for pulmonary edema, with probable small right pleural effusion. Bony thorax is unremarkable. IMPRESSION: Stable bilateral lung opacities concerning for pulmonary edema, with probable small right pleural effusion. Electronically Signed   By: Vincent Conception M.D.   On: 09/14/2020 12:50   ECHOCARDIOGRAM LIMITED  Result Date: 09/15/2020    ECHOCARDIOGRAM LIMITED REPORT   Patient Name:  Vincent Robinson Date of Exam: 09/15/2020 Medical Rec #:  [id number]      Height:       67.0 in Accession #:    [id number]     Weight:       159.8 lb Date of Birth:  1923-07-09       BSA:          1.838 m Patient Age:    86 years       BP:           100/38 mmHg Patient Gender: M              HR:           53 bpm. Exam Location:  Inpatient Procedure: Limited Color Doppler, Cardiac Doppler, Limited Echo and Intracardiac            Opacification Agent Indications:    Hypotension [[id number]]  History:        Patient has prior  history of Echocardiogram examinations, most                 recent 09/08/2020. CHF, Pulmonary HTN and COPD, Aortic Valve                 Disease; Risk Factors:Sleep Apnea, Former Smoker and                 Dyslipidemia. AAA s/p Thoracic Stent. Asthma. Hypothyroid.                 Chronic Hypoxic Respiratory Failure.  Sonographer:    Vincent Robinson RDCS Referring Phys: [phone] [place]  1. Left ventricular ejection fraction, by estimation, is 60 to 65%. The left ventricle has normal function. There is mild left ventricular hypertrophy. Left ventricular diastolic parameters are consistent with Grade II diastolic dysfunction (pseudonormalization). Elevated left atrial pressure.  2. Right ventricular systolic function is moderately reduced. The right ventricular size is moderately enlarged. There is severely elevated pulmonary artery systolic pressure. The estimated right ventricular systolic pressure is 35.3 mmHg.  3. Left atrial size was mildly dilated.  4. Right atrial size was moderately dilated.  5. The mitral valve is degenerative. Mild mitral valve regurgitation.  6. Tricuspid valve regurgitation is severe.  7. The aortic valve is calcified. There is severe calcifcation of the aortic valve. There is severe thickening of the aortic valve. Aortic valve regurgitation is mild.  8. The inferior vena cava is dilated in size with <50% respiratory variability, suggesting right atrial pressure of 15 mmHg. Comparison(s): No significant change from prior study. Prior images reviewed side by side. FINDINGS  Left Ventricle: Left ventricular ejection fraction, by estimation, is 60 to 65%. The left ventricle has normal function. Definity contrast agent was given IV to delineate the left ventricular endocardial borders. There is mild left ventricular hypertrophy. Left ventricular diastolic parameters are consistent with Grade II diastolic dysfunction (pseudonormalization). Elevated left atrial pressure. Right  Ventricle: The right ventricular size is moderately enlarged. Right ventricular systolic function is moderately reduced. There is severely elevated pulmonary artery systolic pressure. The tricuspid regurgitant velocity is 4.21 m/s, and with an assumed right atrial pressure of 15 mmHg, the estimated right ventricular systolic pressure is 61.4 mmHg. Left Atrium: Left atrial size was mildly dilated. Right Atrium: Right atrial size was moderately dilated. Mitral Valve: The mitral valve is degenerative in appearance. There is mild thickening of the mitral valve leaflet(s). There is mild calcification of the mitral valve leaflet(s). Mild mitral  annular calcification. Mild mitral valve regurgitation. Tricuspid Valve: The tricuspid valve is not well visualized. Tricuspid valve regurgitation is severe. Aortic Valve: The aortic valve is calcified. There is severe calcifcation of the aortic valve. There is severe thickening of the aortic valve. Aortic valve regurgitation is mild. Pulmonic Valve: The pulmonic valve was not well visualized. Pulmonic valve regurgitation is mild. Venous: The inferior vena cava is dilated in size with less than 50% respiratory variability, suggesting right atrial pressure of 15 mmHg. LEFT VENTRICLE PLAX 2D LVIDd:         4.70 cm     Diastology LVIDs:         3.70 cm     LV e' medial:    5.22 cm/s LV PW:         1.30 cm     LV E/e' medial:  17.4 LV IVS:        1.30 cm     LV e' lateral:   10.10 cm/s                            LV E/e' lateral: 9.0  LV Volumes (MOD) LV vol d, MOD A2C: 69.8 ml LV vol d, MOD A4C: 79.2 ml LV vol s, MOD A2C: 19.2 ml LV vol s, MOD A4C: 28.7 ml LV SV MOD A2C:     50.6 ml LV SV MOD A4C:     79.2 ml LV SV MOD BP:      50.8 ml RIGHT VENTRICLE TAPSE (M-mode): 1.4 cm LEFT ATRIUM         Index LA diam:    4.40 cm 2.39 cm/m  AORTIC VALVE LVOT Vmax:   75.10 cm/s LVOT Vmean:  55.800 cm/s LVOT VTI:    0.165 m MITRAL VALVE               TRICUSPID VALVE MV Area (PHT): 3.63 cm    TR  Peak grad:   70.9 mmHg MV Decel Time: 209 msec    TR Vmax:        421.00 cm/s MV E velocity: 90.80 cm/s MV A velocity: 61.30 cm/s  SHUNTS MV E/A ratio:  1.48        Systemic VTI: 0.16 m Candee Furbish Robinson Electronically signed by Candee Furbish Robinson Signature Date/Time: 09/15/2020/1:27:33 PM    Final     Labs:  CBC: Recent Labs    09/09/20 0500 09/10/20 0500 09/12/20 0653 09/14/20 0515  WBC 11.9* 6.7 7.0 7.0  HGB 12.1* 11.0* 12.0* 11.2*  HCT 37.9* 34.4* 36.5* 33.6*  PLT 204 163 204 249    COAGS: Recent Labs    03/22/20 2008 09/07/20 2355 09/08/20 0740  INR 1.2 1.1 1.3*  APTT  --  32  --     BMP: Recent Labs    03/22/20 0042 03/22/20 0042 03/22/20 0426 03/22/20 0426 03/23/20 0542 03/23/20 0542 03/25/20 0514 09/07/20 2320 09/13/20 0825 09/14/20 0515 09/15/20 0527 09/16/20 0350  NA 140   < > 139   < > 138   < > 136   < > 135 134* 135 133*  K 3.9   < > 3.9   < > 4.0   < > 4.2   < > 3.6 3.4* 3.0* 2.8*  CL 102   < > 105   < > 104   < > 105   < > 95* 96* 95* 96*  CO2 28   < >  25   < > 23   < > 24   < > 29 30 29 26   GLUCOSE 112*   < > 107*   < > 103*   < > 96   < > 89 88 115* 114*  BUN 23   < > 23   < > 28*   < > 25*   < > 15 18 15 13   CALCIUM 8.6*   < > 8.0*   < > 8.1*   < > 8.0*   < > 7.7* 8.0* 7.9* 7.6*  CREATININE 1.39*   < > 1.24   < > 0.99   < > 0.97   < > 1.16 1.31* 1.21 1.08  GFRNONAA 43*   < > 49*   < > >60   < > >60   < > 58* 50* 55* >60  GFRAA 50*  --  57*  --  >60  --  >60  --   --   --   --   --    < > = values in this interval not displayed.    LIVER FUNCTION TESTS: Recent Labs    09/08/20 0740 09/09/20 0500 09/10/20 0500 09/12/20 0653  BILITOT 3.7* 4.2* 3.0* 2.4*  AST 439* 147* 64* 25  ALT 278* 167* 108* 64*  ALKPHOS 184* 113 82 73  PROT 6.9 6.2* 5.7* 6.9  ALBUMIN 3.2* 2.7* 2.4* 2.8*    Assessment and Plan: Pt with hx sepsis/acute cholecystitis; s/p perc GB drain 11/18; afebrile; bradycardic; last WBC nl; K 2.8- replace; creat nl; bile cx-  enterococcus/enterobacter; cont current tx/drain irrigation; pt to keep drain at least 4-6 weeks unless GB removed surgically in interim; f/u cholangiogram in about 6 weeks or sooner if any problems arise   Electronically Signed: D. Rowe Robert, Robinson 09/16/2020, 1:42 PM   I spent a total of 15 minutes at the the patient's bedside AND on the patient's Robinson floor or unit, greater than 50% of which was counseling/coordinating care for gallbladder drain    Patient ID: Vincent Robinson, male   DOB: 1922-12-31, 86 y.o.   MRN: [id number]

## 2020-09-17 DIAGNOSIS — I50810 Right heart failure, unspecified: Secondary | ICD-10-CM

## 2020-09-17 DIAGNOSIS — I270 Primary pulmonary hypertension: Secondary | ICD-10-CM | POA: Diagnosis not present

## 2020-09-17 DIAGNOSIS — I9589 Other hypotension: Secondary | ICD-10-CM | POA: Diagnosis not present

## 2020-09-17 DIAGNOSIS — I50813 Acute on chronic right heart failure: Secondary | ICD-10-CM | POA: Diagnosis not present

## 2020-09-17 DIAGNOSIS — K810 Acute cholecystitis: Secondary | ICD-10-CM | POA: Diagnosis not present

## 2020-09-17 DIAGNOSIS — J9621 Acute and chronic respiratory failure with hypoxia: Secondary | ICD-10-CM | POA: Diagnosis not present

## 2020-09-17 LAB — BASIC METABOLIC PANEL
Anion gap: 10 (ref 5–15)
BUN: 13 mg/dL (ref 8–23)
CO2: 26 mmol/L (ref 22–32)
Calcium: 8.1 mg/dL — ABNORMAL LOW (ref 8.9–10.3)
Chloride: 97 mmol/L — ABNORMAL LOW (ref 98–111)
Creatinine, Ser: 1.27 mg/dL — ABNORMAL HIGH (ref 0.61–1.24)
GFR, Estimated: 52 mL/min — ABNORMAL LOW (ref >60)
Glucose, Bld: 98 mg/dL (ref 70–99)
Potassium: 4.4 mmol/L (ref 3.5–5.1)
Sodium: 133 mmol/L — ABNORMAL LOW (ref 135–145)

## 2020-09-17 LAB — CBC
HCT: 37.7 % — ABNORMAL LOW (ref 39.0–52.0)
Hemoglobin: 12.2 g/dL — ABNORMAL LOW (ref 13.0–17.0)
MCH: 31.7 pg (ref 26.0–34.0)
MCHC: 32.4 g/dL (ref 30.0–36.0)
MCV: 97.9 fL (ref 80.0–100.0)
Platelets: 329 10*3/uL (ref 150–400)
RBC: 3.85 MIL/uL — ABNORMAL LOW (ref 4.22–5.81)
RDW: 14.6 % (ref 11.5–15.5)
WBC: 8.5 10*3/uL (ref 4.0–10.5)
nRBC: 0 % (ref 0.0–0.2)

## 2020-09-17 LAB — MAGNESIUM: Magnesium: 2.1 mg/dL (ref 1.7–2.4)

## 2020-09-17 MED ORDER — ASPIRIN 81 MG PO CHEW
81.0000 mg | CHEWABLE_TABLET | Freq: Every day | ORAL | Status: DC
  Administered 2020-09-17 – 2020-09-19 (×3): 81 mg via ORAL
  Filled 2020-09-17 (×3): qty 1

## 2020-09-17 MED ORDER — POLYETHYLENE GLYCOL 3350 17 G PO PACK: 17.0000 g | PACK | Freq: Every day | ORAL | Status: DC | PRN

## 2020-09-17 MED ORDER — SIMVASTATIN 20 MG PO TABS
10.0000 mg | ORAL_TABLET | Freq: Every day | ORAL | Status: DC
  Administered 2020-09-17 – 2020-09-19 (×3): 10 mg via ORAL
  Filled 2020-09-17 (×3): qty 1

## 2020-09-17 MED ORDER — SODIUM CHLORIDE 0.9 % IV SOLN: INTRAVENOUS | Status: AC

## 2020-09-17 NOTE — Progress Notes (Signed)
Pt placed on BIPAP dream station for the night on home setting of 14/6 w/6lpm bled into the system. Pt respiratory status is stable w/no distress noted at this time. RT will continue to monitor.

## 2020-09-17 NOTE — Progress Notes (Addendum)
Patient ID: Vincent Robinson, male   DOB: [birth year], 86 y.o.   MRN: [id number]     Advanced Heart Failure Rounding Note  PCP-Cardiologist: Quay Burow, MD   Subjective:    He remains on norepinephrine 4 this morning.  CVP line not working.  Creatinine stable 1.27.  He is on 10L oxygen but saturation upper 90s.   No complaints, no dyspnea or lightheadedness.    Echo: EF 60-65%, moderately dilated and moderately dysfunctional RV, PASP 86 mmHg, moderate AS.   Objective:   Weight Range: 69.6 kg Body mass index is 24.03 kg/m.   Vital Signs:   Temp:  [97.4 F (36.3 C)-98.3 F (36.8 C)] 97.4 F (36.3 C) (11/27 0812) Pulse Rate:  [36-58] 49 (11/27 0700) Resp:  [11-23] 18 (11/27 0700) BP: (77-149)/(41-77) 120/57 (11/27 0700) SpO2:  [79 %-99 %] 96 % (11/27 0812) Weight:  [69.6 kg] 69.6 kg (11/27 0500) Last BM Date: 09/16/20  Weight change: Filed Weights   09/15/20 0500 09/16/20 0452 09/17/20 0500  Weight: 72.5 kg 71.7 kg 69.6 kg    Intake/Output:   Intake/Output Summary (Last 24 hours) at 09/17/2020 0850 Last data filed at 09/17/2020 0600 Gross per 24 hour  Intake 914.65 ml  Output 1860 ml  Net -945.35 ml      Physical Exam    General:  Well appearing. No resp difficulty HEENT: Normal Neck: Supple. JVP difficult (sitting on commode) but appears elevated. Carotids 2+ bilat; no bruits. No lymphadenopathy or thyromegaly appreciated. Cor: PMI nondisplaced. Regular rate & rhythm. No rubs, gallops. 2/6 SEM RUSB.  Lungs: Mild crackles at bases.  Abdomen: Soft, nontender, nondistended. No hepatosplenomegaly. No bruits or masses. Good bowel sounds. Extremities: No cyanosis, clubbing, rash, edema Neuro: Alert & orientedx3, cranial nerves grossly intact. moves all 4 extremities w/o difficulty. Affect pleasant   Telemetry   NSR 70s (personally reviewed).   Labs    CBC Recent Labs    09/17/20 0351  WBC 8.5  HGB 12.2*  HCT 37.7*  MCV 97.9  PLT 517   Basic  Metabolic Panel Recent Labs    09/16/20 1337 09/17/20 0351  NA 134* 133*  K 3.2* 4.4  CL 100 97*  CO2 23 26  GLUCOSE 114* 98  BUN 13 13  CREATININE 0.95 1.27*  CALCIUM 7.6* 8.1*  MG  --  2.1   Liver Function Tests No results for input(s): AST, ALT, ALKPHOS, BILITOT, PROT, ALBUMIN in the last 72 hours. No results for input(s): LIPASE, AMYLASE in the last 72 hours. Cardiac Enzymes No results for input(s): CKTOTAL, CKMB, CKMBINDEX, TROPONINI in the last 72 hours.  BNP: BNP (last 3 results) Recent Labs    09/14/20 0515 09/15/20 0527 09/16/20 1337  BNP 1,035.8* 1,215.7* 370.2*    ProBNP (last 3 results) No results for input(s): PROBNP in the last 8760 hours.   D-Dimer No results for input(s): DDIMER in the last 72 hours. Hemoglobin A1C No results for input(s): HGBA1C in the last 72 hours. Fasting Lipid Panel No results for input(s): CHOL, HDL, LDLCALC, TRIG, CHOLHDL, LDLDIRECT in the last 72 hours. Thyroid Function Tests No results for input(s): TSH, T4TOTAL, T3FREE, THYROIDAB in the last 72 hours.  Invalid input(s): FREET3  Other results:   Imaging     No results found.   Medications:     Scheduled Medications: . Chlorhexidine Gluconate Cloth  6 each Topical Daily  . enoxaparin (LOVENOX) injection  30 mg Subcutaneous Daily  . ipratropium-albuterol  3 mL Nebulization  BID  . ketotifen  1 drop Right Eye BID  . levothyroxine  100 mcg Oral Q0600  . macitentan  10 mg Oral Daily  . mouth rinse  15 mL Mouth Rinse BID  . midodrine  15 mg Oral TID PC  . mometasone-formoterol  2 puff Inhalation BID  . pantoprazole  40 mg Oral Q24H  . sildenafil  40 mg Oral TID  . sodium chloride flush  10-40 mL Intracatheter Q12H  . sodium chloride flush  5 mL Intracatheter Q8H  . torsemide  40 mg Oral Daily     Infusions: . sodium chloride 250 mL (09/16/20 1704)  . norepinephrine (LEVOPHED) Adult infusion 4 mcg/min (09/17/20 6237)  . piperacillin-tazobactam (ZOSYN)   IV Stopped (09/17/20 0411)     PRN Medications:  HYDROcodone-acetaminophen, norepinephrine (LEVOPHED) Adult infusion, ondansetron (ZOFRAN) IV, polyethylene glycol, senna, sodium chloride flush    Assessment/Plan   1. Severe Pulmonary Hypertension with Cor Pulmonale/ Acute on Chronic Diastolic CHF - Suspect mainly WHO Group I based on w/u - echo as above with RVSP 66mmHG - back on macitentan and sildenafil 40 tid. Continue for now.  - co-ox ok 11/25.  - Work on weaning NE, on midodrine 15 mg tid and will aim to keep SBP > 85.  - CVP line not working today, Marine scientist to troubleshoot.  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  Will restart home torsemide 40 mg daily today.  - Suspect we can wean oxygen, he only wears 2L [place] at home.   2. Acute cholecystitis - s/p percutaneous C-tube, will need 4-6 wks.  - improved. Managed by primary team - Continue Zosyn.   3. Acute on chronic hypoxic respiratory failure on home O2 - multifactorial: PAH/diastolic HF/Interstitial Lung Disease/COPD/Asthma - improving, wean oxygen today.  - mobilize. continue pulmonary toilet  4. CAD s/p Remote CABG in 1998 - no s/s ischemia - Resumed home ASA/statin  5. Paroxysmal Atrial Fibrillation - now in sinus brady - Not on chronic anticoagulation due to history of severe diverticular bleeds requiring transfusions.   6. Moderate Aortic Stenosis - Echo 09/08/20 MG 20 mmHg, AVA 1.45 cm^2, DI 0.29   7. Thoracic Aortic Aneurysm - CTA in 10/2019 showed 4.5cm ascending thoracic aortic aneurysm. Semi-annual imaging recommended at that time.  8. AAA - History endoluminal stent graft in 07/2008 (in [place]). - Most recent ultrsound in 09/2017 showed patent aorta and iliac stent with abnormal dilatation of the mid and distal abdominal aorta (largest measurement 2.9 cm). - Due to repeat imaging. Can be done as outpatient.   9. Hypokalemia - Stable.   CRITICAL CARE Performed by: Loralie Champagne  Total critical care  time: 35 minutes  Critical care time was exclusive of separately billable procedures and treating other patients.  Critical care was necessary to treat or prevent imminent or life-threatening deterioration.  Critical care was time spent personally by me on the following activities: development of treatment plan with patient and/or surrogate as well as nursing, discussions with consultants, evaluation of patient's response to treatment, examination of patient, obtaining history from patient or surrogate, ordering and performing treatments and interventions, ordering and review of laboratory studies, ordering and review of radiographic studies, pulse oximetry and re-evaluation of patient's condition.   Length of Stay: 9  Loralie Champagne, MD  09/17/2020, 8:50 AM  Advanced Heart Failure Team Pager [phone] (M-F; 7a - 4p)  Please contact [facility] for night-coverage after hours (4p -7a ) and weekends on [email or web address]

## 2020-09-17 NOTE — Progress Notes (Signed)
Physical Therapy Treatment Patient Details Name: Vincent Robinson MRN: [id number] DOB: Mar 12, 1923 Today's Date: 09/17/2020    History of Present Illness 86 year old with asthma, ILD, pulmonary hypertension, OSA, CABG Admitted 09/07/20  with septic shock, acute cholecystitis. S/p perc chole drain placement by IR 11/18.  Transferred from [facility] to [facility] on 11/26 due to pulmonary HTN.     PT Comments    Pt admitted with above diagnosis. Pt was able to ambulate with RW a few feet forward and back and needed frequent rests due to desaturation with activity to 84% on 10LHFNC.  Pt also performed marching and squats. Pt very motivated.  Just has poor endurance for activity.  Pt currently with functional limitations due to balance and endurance deficits. Pt will benefit from skilled PT to increase their independence and safety with mobility to allow discharge to the venue listed below.     Follow Up Recommendations  Home health PT;Supervision/Assistance - 24 hour     Equipment Recommendations  None recommended by PT    Recommendations for Other Services OT consult     Precautions / Restrictions Precautions Precautions: Fall Precaution Comments: R flank drain, monitor O2 Restrictions Weight Bearing Restrictions: No    Mobility  Bed Mobility               General bed mobility comments: Up in chair on arrival  Transfers Overall transfer level: Needs assistance Equipment used: Rolling walker (2 wheeled) Transfers: Sit to/from Stand Sit to Stand: Min assist         General transfer comment: Pt stood x 2 to RW with min assist to power up.  Performed marching, squats and knee lifts.  Each attempt, desat to 84% on 10LHFNc but recovered with seated rest break.    Ambulation/Gait Ambulation/Gait assistance: Min assist Gait Distance (Feet): 8 Feet (4 feet forward and back x 2) Assistive device: Rolling walker (2 wheeled) Gait Pattern/deviations: Step-through pattern;Decreased stride  length;Trunk flexed   Gait velocity interpretation: <1.31 ft/sec, indicative of household ambulator General Gait Details: Pt took 4 steps forward and back x2 limited by lines somewhat as he didnot want his primofit removed due to frquent urination.  Pt needed rest after performing this x 2 as he desat to 84%.  Recovereed to 93% with rest on 10LHFNC.     Stairs             Wheelchair Mobility    Modified Rankin (Stroke Patients Only)       Balance Overall balance assessment: Needs assistance Sitting-balance support: No upper extremity supported Sitting balance-Leahy Scale: Good     Standing balance support: Bilateral upper extremity supported Standing balance-Leahy Scale: Poor Standing balance comment: reliant on RW                            Cognition Arousal/Alertness: Awake/alert Behavior During Therapy: WFL for tasks assessed/performed Overall Cognitive Status: Within Functional Limits for tasks assessed                                 General Comments: AxO x 3 pleasant/motivated      Exercises General Exercises - Lower Extremity Ankle Circles/Pumps: Both;10 reps;Supine;AROM Hip Flexion/Marching: Strengthening;Standing;AROM;Both;20 reps Mini-Sqauts: AROM;Both;10 reps;Standing    General Comments General comments (skin integrity, edema, etc.): HR 42-52 bpm, 84-93% O2 on 10LHFNC,  129/53 initial BP, 105/49 during treatment and 123/50 at end  of treatment.       Pertinent Vitals/Pain Pain Assessment: No/denies pain    Home Living                      Prior Function            PT Goals (current goals can now be found in the care plan section) Acute Rehab PT Goals Patient Stated Goal: to go home. visit his wife Progress towards PT goals: Progressing toward goals    Frequency    Min 3X/week      PT Plan Current plan remains appropriate    Co-evaluation              AM-PAC PT "6 Clicks" Mobility    Outcome Measure  Help needed turning from your back to your side while in a flat bed without using bedrails?: A Little Help needed moving from lying on your back to sitting on the side of a flat bed without using bedrails?: A Little Help needed moving to and from a bed to a chair (including a wheelchair)?: A Little Help needed standing up from a chair using your arms (e.g., wheelchair or bedside chair)?: A Little Help needed to walk in hospital room?: A Little Help needed climbing 3-5 steps with a railing? : A Lot 6 Click Score: 17    End of Session Equipment Utilized During Treatment: Oxygen;Gait belt Activity Tolerance: Patient tolerated treatment well Patient left: in chair;with call bell/phone within reach;with chair alarm set Nurse Communication: Mobility status PT Visit Diagnosis: Unsteadiness on feet (R26.81);Difficulty in walking, not elsewhere classified (R26.2)     Time: 1610-9604 PT Time Calculation (min) (ACUTE ONLY): 19 min  Charges:  $Gait Training: 8-22 mins                     Marveline Profeta W,PT [place] Pager:  [phone]  Office:  [place] 09/17/2020, 3:57 PM

## 2020-09-17 NOTE — Progress Notes (Addendum)
NAME:  Vincent Robinson, MRN:  [id number], DOB:  1923/04/06, LOS: 9 ADMISSION DATE:  09/07/2020, CONSULTATION DATE:  09/08/20 REFERRING MD:  Dr April Pulumbo, CHIEF COMPLAINT:  Septic shock from cholecystitis  Brief History   86 y/o M admitted 11/17 with abdominal pain.  Work up concerning for septic shock secondary to acute cholecystitis, acute hypoxic resp failure.   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 AF on monitor / EKG pending  Consults:  Surgery IR  Procedures:  Perc bili drain  Significant Diagnostic Tests:  11/18 CT ABD/Pelvis w Contrast >> acute cholecystitis, 2.3 CM enhancing urothelial carcinoma within the right posterior bladder lumen in the region of the right ureterovesicular junction  11/18 CTA Chest 11/8 >> negative for PE, morphologic changes of pulmonary arterial hypertension, elevated right heart pressures, some degree of right heart failure, stable 4.5 cm thoracic aortic aneurysm, patchy ground-glass infiltrate in LUL 11/18 ECHO >> LVEF 55-60%, moderate asymetric LV hypertrophy of the basal-septal segment, grade II diastolic dysfunction, elevated LA pressure, interventricular septum is flattened in systole and diastole consistent with RV pressure overload, RV systolic function moderately reduced, RV severely enlarged, RV systolic pressure 81, moderate to severe TR  ECHO 11/25> LVEF 60-65%, normal LV function with mild LVH. Grade II diastolic dysfunction. Elevated LA pressure.  RV systolic function is moderately reduced. RV enlarged, severely elevated pulm  artery systolic pressure. RVSP 85.9 mmHg.  Mild MVR Severe TVR Severe AV calcification and thickening, mild AVR  Micro Data:  COVID 11/18 >> negative  Influenza 11/18 >> negative BCx2 11/17 >> 1/2 with GPC's >> Staph hominis and staph lugdunesis U. Strep Antigen 11/18 >> negative  Legionella 11/18 >>  UC 11/18 >>  Gallbladder Culture 11/18 >> moderate GPC, few GPR, rare GNR >> Enterococcus faecalis, enterobacter aerogenes Blood culture 11/21> NG, final  Antimicrobials:  Vanco 11/17 >> 11/17 Cefepime 11/17 >> 11/18 Zosyn 11/18 >>   Interim history/subjective:   Transferred from [place] for further follow-up of well compensated pulmonary hypertension. Patient feels well and denies dyspnea.  Denies abdominal pain.  Objective   Blood pressure (!) 97/39, pulse (!) 40, temperature (!) 96.3 F (35.7 C), temperature source Oral, resp. rate 20, height 5\' 7"  (1.702 m), weight 69.6 kg, SpO2 91 %. CVP:  [0 mmHg-22 mmHg] 8 mmHg      Intake/Output Summary (Last 24 hours) at 09/17/2020 1452 Last data filed at 09/17/2020 1000 Gross per 24 hour  Intake 548.96 ml  Output 1230 ml  Net -681.04 ml   Filed Weights   09/15/20 0500 09/16/20 0452 09/17/20 0500  Weight: 72.5 kg 71.7 kg 69.6 kg    Examination: General: Elderly man sitting in chair in no distress, in good spirits HEENT: [place]/AT, eyes anicteric, oral mucosa moist Neuro: AAOx3 following commands. Globally weak/ deconditioned  CV: Reg rhythm, normal S1, 3/6 late peaking crescendo decrescendo murmur merging with the second heart sound and radiating to the carotids.  No P2 accentuation, no RV lift cap refill < 3 seconds  PULM: Symmetrical chest expansion, no accessory  use on 5 L HFNC.  Bibasilar fine crackles GI: Percutaneous biliary  drain with brown bilious output. Site clean and dry.  No abdominal pain Extremities: No obvious joint deformity. RUE PICC. No edema  Skin: thin, clean. Dry with flaking BLE  Psych: Good insight.  Pleasant mood. Engaged. Appropriate for age and situation   Resolved Hospital Problem list   Shock  Hyperglycemia   Assessment & Plan:   Critically ill due to sepsis due to acute cholecystitis requiring titration of norepinephrine with Enterococcus and Enterobacter bacteremia On midodrine PTA -s/p perc GB drain with IR 11/18 -- cont for 4-6wk  -cont zosyn - 14d course planned -cont midodrine; increased this admission to uptitrate PH meds -OP surgery follow up in approx 1 month -Cholangiogram in approx 6wk  -Should be able to wean norepinephrine to off.  Severe Pulmonary HTN - chronic Emphysema  Post Inflammatory ILD  Acute on Chronic Hypoxic Respiratory Failure  OSA on CPAP  Acute pulmonary edema improving O2 requirement -Continue to wean supplemental O2 to support sats >88%. Ok to increase this during mobility efforts  -Ruthe Mannan, Duoneb -continue sildenafil TID; increased to full dose on 11/24 -continue opsumit 10mg  daily  -MAP goal > 55, plan to start fixed dose of levo and continue pulm HTN meds  -CPAP QHS  Chronic HFpEF and Cor Pulmonale (sees Dr Jeffie Pollock as an outpatient) -Appears euvolemic.  No additional diuresis today -Gentle fluid challenge given increasing creatinine and ongoing vasopressor requirement.  Hypokalemia repleted 11/26 -Continue to monitor.  Suspected Urothelial Cancer (noted on CT in June 2021) -will need Urology consult prior to discharge   Hypothyroidism  TSH 0.827 on admit  -Continue synthroid   Chronic anemia currently Hb above baseline -Con't to monitor periodically -Transfuse for Hb <7 or hemodynamically significant bleeding  Constipation -Daily MiraLAX w/ hold parameters -Senna   Best practice:  Diet: regular diet Pain/Anxiety/Delirium protocol (if indicated): n/a VAP protocol (if indicated): n/a DVT prophylaxis: lovenox  GI prophylaxis: PPI Glucose control: monitor  Mobility: PT, OT  Code Status: full code   Family  Communication: pt updated 11/26, daughter [name] updated 11/26.  Daughter Hilda Blades updated 11/27 Disposition: Transferring to [facility]ICU 11/26. Will remain on [place]  Lab 09/13/20 1111 09/14/20 1029 09/15/20 0952 09/16/20 1337  O2SAT 66.3 69.2 76.0 97.9    CBC Recent Labs  Lab 09/12/20 0653 09/14/20 0515 09/17/20 0351  HGB 12.0* 11.2* 12.2*  HCT 36.5* 33.6* 37.7*  WBC 7.0 7.0 8.5  PLT 204 249 329    COAGULATION No results for input(s): INR in the last 168 hours.  CARDIAC  No results for input(s): TROPONINI in the last 168 hours. No results for input(s): PROBNP in the last 168 hours.   CHEMISTRY Recent Labs  Lab 09/13/20 0825 09/13/20 0825 09/14/20 0515 09/14/20 0515 09/15/20 1517 09/15/20 0527 09/16/20 0350 09/16/20 0350 09/16/20 1337 09/17/20 0351  NA 135   < > 134*  --  135  --  133*  --  134* 133*  K 3.6   < > 3.4*   < > 3.0*   < > 2.8*   < > 3.2* 4.4  CL 95*   < > 96*  --  95*  --  96*  --  100 97*  CO2 29   < > 30  --  29  --  26  --  23 26  GLUCOSE 89   < > 88  --  115*  --  114*  --  114* 98  BUN 15   < > 18  --  15  --  13  --  13 13  CREATININE 1.16   < > 1.31*  --  1.21  --  1.08  --  0.95 1.27*  CALCIUM 7.7*   < > 8.0*  --  7.9*  --  7.6*  --  7.6* 8.1*  MG 1.8  --   --   --   --   --   --   --   --  2.1   < > = values in this interval not displayed.   Estimated Creatinine Clearance: 32.5 mL/min (A) (by C-G formula based on SCr of 1.27 mg/dL (H)).   LIVER Recent Labs  Lab 09/12/20 0653  AST 25  ALT 64*  ALKPHOS 73  BILITOT 2.4*  PROT 6.9  ALBUMIN 2.8*    INFECTIOUS No results for input(s): LATICACIDVEN, PROCALCITON in the last 168 hours.  ENDOCRINE CBG (last 3)  Recent Labs    09/16/20 1347  GLUCAP 119*    CRITICAL CARE Performed by: Kipp Brood   Total critical care time: 40 minutes  Critical care time was exclusive of separately billable procedures and treating other  patients. Critical care was necessary to treat or prevent imminent or life-threatening deterioration.  Critical care was time spent personally by me on the following activities: development of treatment plan with patient and/or surrogate as well as nursing, discussions with consultants, evaluation of patient's response to treatment, examination of patient, obtaining history from patient or surrogate, ordering and performing treatments and interventions, ordering and review of laboratory studies, ordering and review of radiographic studies, pulse oximetry and re-evaluation of patient's condition.  Kipp Brood, MD [facility] ICU Physician [place]  Pager: [phone] Or Epic Secure Chat After hours: [phone].  09/17/2020, 3:00 PM      09/17/2020, 2:52 PM

## 2020-09-17 NOTE — Progress Notes (Signed)
NAME:  Vincent Robinson, MRN:  [id number], DOB:  04-24-1923, LOS: 9 ADMISSION DATE:  09/07/2020, CONSULTATION DATE:  09/08/20 REFERRING MD:  Dr April Pulumbo, CHIEF COMPLAINT:  Septic shock from cholecystitis  Brief History   86 y/o M admitted 11/17 with abdominal pain.  Work up concerning for septic shock secondary to acute cholecystitis, acute hypoxic resp failure.   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 AF on monitor / EKG pending  Consults:  Surgery IR  Procedures:  Perc bili drain  Significant Diagnostic Tests:  11/18 CT ABD/Pelvis w Contrast >> acute cholecystitis, 2.3 CM enhancing urothelial carcinoma within the right posterior bladder lumen in the region of the right ureterovesicular junction  11/18 CTA Chest 11/8 >> negative for PE, morphologic changes of pulmonary arterial hypertension, elevated right heart pressures, some degree of right heart failure, stable 4.5 cm thoracic aortic aneurysm, patchy ground-glass infiltrate in LUL 11/18 ECHO >> LVEF 55-60%, moderate asymetric LV hypertrophy of the basal-septal segment, grade II diastolic dysfunction, elevated LA pressure, interventricular septum is flattened in systole and diastole consistent with RV pressure overload, RV systolic function moderately reduced, RV severely enlarged, RV systolic pressure 81, moderate to severe TR  ECHO 11/25> LVEF 60-65%, normal LV function with mild LVH. Grade II diastolic dysfunction. Elevated LA pressure.  RV systolic function is moderately reduced. RV enlarged, severely elevated pulm  artery systolic pressure. RVSP 85.9 mmHg.  Mild MVR Severe TVR Severe AV calcification and thickening, mild AVR  Micro Data:  COVID 11/18 >> negative  Influenza 11/18 >> negative BCx2 11/17 >> 1/2 with GPC's >> Staph hominis and staph lugdunesis U. Strep Antigen 11/18 >> negative  Legionella 11/18 >>  UC 11/18 >>  Gallbladder Culture 11/18 >> moderate GPC, few GPR, rare GNR >> Enterococcus faecalis, enterobacter aerogenes Blood culture 11/21> NG, final  Antimicrobials:  Vanco 11/17 >> 11/17 Cefepime 11/17 >> 11/18 Zosyn 11/18 >>   Interim history/subjective:   Transferred from [place] for further follow-up of well compensated pulmonary hypertension. Patient feels well and denies dyspnea.  Denies abdominal pain.  Objective   Blood pressure (!) 97/39, pulse (!) 40, temperature (!) 96.3 F (35.7 C), temperature source Oral, resp. rate 20, height 5\' 7"  (1.702 m), weight 69.6 kg, SpO2 91 %. CVP:  [0 mmHg-22 mmHg] 8 mmHg      Intake/Output Summary (Last 24 hours) at 09/17/2020 1518 Last data filed at 09/17/2020 1000 Gross per 24 hour  Intake 541.51 ml  Output 1230 ml  Net -688.49 ml   Filed Weights   09/15/20 0500 09/16/20 0452 09/17/20 0500  Weight: 72.5 kg 71.7 kg 69.6 kg    Examination: General: Elderly man sitting in chair in no distress, in good spirits HEENT: [place]/AT, eyes anicteric, oral mucosa moist Neuro: AAOx3 following commands. Globally weak/ deconditioned  CV: Reg rhythm, normal S1, 3/6 late peaking crescendo decrescendo murmur merging with the second heart sound and radiating to the carotids.  No P2 accentuation, no RV lift cap refill < 3 seconds  PULM: Symmetrical chest expansion, no accessory  use on 5 L HFNC.  Bibasilar fine crackles GI: Percutaneous biliary  drain with brown bilious output. Site clean and dry.  No abdominal pain Extremities: No obvious joint deformity. RUE PICC. No edema  Skin: thin, clean. Dry with flaking BLE  Psych: Good insight.  Pleasant mood. Engaged. Appropriate for age and situation   Resolved Hospital Problem list   Shock  Hyperglycemia   Assessment & Plan:   Critically ill due to sepsis due to acute cholecystitis requiring titration of norepinephrine with Enterococcus and Enterobacter bacteremia On midodrine PTA -s/p perc GB drain with IR 11/18 -- cont for 4-6wk  -cont zosyn - 14d course planned -cont midodrine; increased this admission to uptitrate PH meds -OP surgery follow up in approx 1 month -Cholangiogram in approx 6wk  -Should be able to wean norepinephrine to off.  Severe Pulmonary HTN - chronic Emphysema  Post Inflammatory ILD  Acute on Chronic Hypoxic Respiratory Failure  OSA on CPAP  Acute pulmonary edema improving O2 requirement -Continue to wean supplemental O2 to support sats >88%. Ok to increase this during mobility efforts  -Ruthe Mannan, Duoneb -continue sildenafil TID; increased to full dose on 11/24 -continue opsumit 10mg  daily  -MAP goal > 55, plan to start fixed dose of levo and continue pulm HTN meds  -CPAP QHS  Chronic HFpEF and Cor Pulmonale (sees Dr Jeffie Pollock as an outpatient) -Appears euvolemic.  No additional diuresis today -Gentle fluid challenge given increasing creatinine and ongoing vasopressor requirement.  Hypokalemia repleted 11/26 -Continue to monitor.  Suspected Urothelial Cancer (noted on CT in June 2021) -will need Urology consult prior to discharge   Hypothyroidism  TSH 0.827 on admit  -Continue synthroid   Chronic anemia currently Hb above baseline -Con't to monitor periodically -Transfuse for Hb <7 or hemodynamically significant bleeding  Constipation -Daily MiraLAX w/ hold parameters -Senna   Best practice:  Diet: regular diet Pain/Anxiety/Delirium protocol (if indicated): n/a VAP protocol (if indicated): n/a DVT prophylaxis: lovenox  GI prophylaxis: PPI Glucose control: monitor  Mobility: PT, OT  Code Status: full code   Family  Communication: pt updated 11/26, daughter [name] updated 11/26.  Daughter Hilda Blades updated 11/27 Disposition: Transferring to [facility] CVICU 11/26. Will remain on [place]  Lab 09/13/20 1111 09/14/20 1029 09/15/20 0952 09/16/20 1337  O2SAT 66.3 69.2 76.0 97.9    CBC Recent Labs  Lab 09/12/20 0653 09/14/20 0515 09/17/20 0351  HGB 12.0* 11.2* 12.2*  HCT 36.5* 33.6* 37.7*  WBC 7.0 7.0 8.5  PLT 204 249 329    COAGULATION No results for input(s): INR in the last 168 hours.  CARDIAC  No results for input(s): TROPONINI in the last 168 hours. No results for input(s): PROBNP in the last 168 hours.   CHEMISTRY Recent Labs  Lab 09/13/20 0825 09/13/20 0825 09/14/20 0515 09/14/20 0515 09/15/20 3734 09/15/20 0527 09/16/20 0350 09/16/20 0350 09/16/20 1337 09/17/20 0351  NA 135   < > 134*  --  135  --  133*  --  134* 133*  K 3.6   < > 3.4*   < > 3.0*   < > 2.8*   < > 3.2* 4.4  CL 95*   < > 96*  --  95*  --  96*  --  100 97*  CO2 29   < > 30  --  29  --  26  --  23 26  GLUCOSE 89   < > 88  --  115*  --  114*  --  114* 98  BUN 15   < > 18  --  15  --  13  --  13 13  CREATININE 1.16   < > 1.31*  --  1.21  --  1.08  --  0.95 1.27*  CALCIUM 7.7*   < > 8.0*  --  7.9*  --  7.6*  --  7.6* 8.1*  MG 1.8  --   --   --   --   --   --   --   --  2.1   < > = values in this interval not displayed.   Estimated Creatinine Clearance: 32.5 mL/min (A) (by C-G formula based on SCr of 1.27 mg/dL (H)).   LIVER Recent Labs  Lab 09/12/20 0653  AST 25  ALT 64*  ALKPHOS 73  BILITOT 2.4*  PROT 6.9  ALBUMIN 2.8*    INFECTIOUS No results for input(s): LATICACIDVEN, PROCALCITON in the last 168 hours.  ENDOCRINE CBG (last 3)  Recent Labs    09/16/20 1347  GLUCAP 119*    CRITICAL CARE Performed by: Kipp Brood   Total critical care time: 40 minutes  Critical care time was exclusive of separately billable procedures and treating other  patients. Critical care was necessary to treat or prevent imminent or life-threatening deterioration.  Critical care was time spent personally by me on the following activities: development of treatment plan with patient and/or surrogate as well as nursing, discussions with consultants, evaluation of patient's response to treatment, examination of patient, obtaining history from patient or surrogate, ordering and performing treatments and interventions, ordering and review of laboratory studies, ordering and review of radiographic studies, pulse oximetry and re-evaluation of patient's condition.  Kipp Brood, MD [facility] ICU Physician [place]  Pager: [phone] Or Epic Secure Chat After hours: [phone].  09/17/2020, 3:18 PM

## 2020-09-18 ENCOUNTER — Inpatient Hospital Stay (HOSPITAL_COMMUNITY): Payer: Medicare Other

## 2020-09-18 DIAGNOSIS — I270 Primary pulmonary hypertension: Secondary | ICD-10-CM | POA: Diagnosis not present

## 2020-09-18 DIAGNOSIS — K810 Acute cholecystitis: Secondary | ICD-10-CM | POA: Diagnosis not present

## 2020-09-18 DIAGNOSIS — I9589 Other hypotension: Secondary | ICD-10-CM | POA: Diagnosis not present

## 2020-09-18 DIAGNOSIS — I50810 Right heart failure, unspecified: Secondary | ICD-10-CM | POA: Diagnosis not present

## 2020-09-18 DIAGNOSIS — I50813 Acute on chronic right heart failure: Secondary | ICD-10-CM | POA: Diagnosis not present

## 2020-09-18 DIAGNOSIS — I272 Pulmonary hypertension, unspecified: Secondary | ICD-10-CM | POA: Diagnosis not present

## 2020-09-18 LAB — BASIC METABOLIC PANEL
Anion gap: 13 (ref 5–15)
BUN: 14 mg/dL (ref 8–23)
CO2: 24 mmol/L (ref 22–32)
Calcium: 8.3 mg/dL — ABNORMAL LOW (ref 8.9–10.3)
Chloride: 97 mmol/L — ABNORMAL LOW (ref 98–111)
Creatinine, Ser: 1.09 mg/dL (ref 0.61–1.24)
GFR, Estimated: >60 mL/min (ref >60)
Glucose, Bld: 94 mg/dL (ref 70–99)
Potassium: 3.8 mmol/L (ref 3.5–5.1)
Sodium: 134 mmol/L — ABNORMAL LOW (ref 135–145)

## 2020-09-18 LAB — MAGNESIUM: Magnesium: 2 mg/dL (ref 1.7–2.4)

## 2020-09-18 MED ORDER — POTASSIUM CHLORIDE CRYS ER 20 MEQ PO TBCR
40.0000 meq | EXTENDED_RELEASE_TABLET | Freq: Once | ORAL | Status: AC
  Administered 2020-09-18: 40 meq via ORAL
  Filled 2020-09-18: qty 2

## 2020-09-18 MED ORDER — TORSEMIDE 20 MG PO TABS
40.0000 mg | ORAL_TABLET | Freq: Every day | ORAL | Status: DC
  Administered 2020-09-18 – 2020-09-19 (×2): 40 mg via ORAL
  Filled 2020-09-18 (×2): qty 2

## 2020-09-18 NOTE — Progress Notes (Signed)
Patient ID: Vincent Robinson, male   DOB: Sep 13, 1923, 86 y.o.   MRN: [id number]     Advanced Heart Failure Rounding Note  PCP-Cardiologist: Quay Burow, MD   Subjective:    NE weaned yesterday but back up to 4 overnight. BP 95/71 this morning.  CVP line not set up again and patient on bedside commode. CVP 9 for nurse.  Creatinine stable 1.09.  He is on 10L oxygen still.    No complaints, no dyspnea or lightheadedness.  Walked in room.   Echo: EF 60-65%, moderately dilated and moderately dysfunctional RV, PASP 86 mmHg, moderate AS.   Objective:   Weight Range: 69.1 kg Body mass index is 23.86 kg/m.   Vital Signs:   Temp:  [96.3 F (35.7 C)-97.9 F (36.6 C)] 97.4 F (36.3 C) (11/27 2331) Pulse Rate:  [40-81] 69 (11/28 0700) Resp:  [9-29] 20 (11/28 0700) BP: (76-115)/(32-67) 80/48 (11/28 0645) SpO2:  [82 %-98 %] 87 % (11/28 0700) Weight:  [69.1 kg] 69.1 kg (11/28 0500) Last BM Date: 09/17/20  Weight change: Filed Weights   09/16/20 0452 09/17/20 0500 09/18/20 0500  Weight: 71.7 kg 69.6 kg 69.1 kg    Intake/Output:   Intake/Output Summary (Last 24 hours) at 09/18/2020 0756 Last data filed at 09/18/2020 0700 Gross per 24 hour  Intake 712.43 ml  Output 1670 ml  Net -957.57 ml      Physical Exam    General: NAD Neck: No JVD (sitting on commode), no thyromegaly or thyroid nodule.  Lungs: Decreased at bases bilaterally.  CV: Nondisplaced PMI.  Heart regular S1/S2, no S3/S4, 2/6 SEM RUSB.  No peripheral edema.   Abdomen: Soft, nontender, no hepatosplenomegaly, no distention.  Skin: Intact without lesions or rashes.  Neurologic: Alert and oriented x 3.  Psych: Normal affect. Extremities: No clubbing or cyanosis.  HEENT: Normal.    Telemetry   NSR 80s with frequent PACs (personally reviewed).   Labs    CBC Recent Labs    09/17/20 0351  WBC 8.5  HGB 12.2*  HCT 37.7*  MCV 97.9  PLT 443   Basic Metabolic Panel Recent Labs    09/17/20 0351  09/18/20 0124 09/18/20 0322  NA 133* 134*  --   K 4.4 3.8  --   CL 97* 97*  --   CO2 26 24  --   GLUCOSE 98 94  --   BUN 13 14  --   CREATININE 1.27* 1.09  --   CALCIUM 8.1* 8.3*  --   MG 2.1  --  2.0   Liver Function Tests No results for input(s): AST, ALT, ALKPHOS, BILITOT, PROT, ALBUMIN in the last 72 hours. No results for input(s): LIPASE, AMYLASE in the last 72 hours. Cardiac Enzymes No results for input(s): CKTOTAL, CKMB, CKMBINDEX, TROPONINI in the last 72 hours.  BNP: BNP (last 3 results) Recent Labs    09/14/20 0515 09/15/20 0527 09/16/20 1337  BNP 1,035.8* 1,215.7* 370.2*    ProBNP (last 3 results) No results for input(s): PROBNP in the last 8760 hours.   D-Dimer No results for input(s): DDIMER in the last 72 hours. Hemoglobin A1C No results for input(s): HGBA1C in the last 72 hours. Fasting Lipid Panel No results for input(s): CHOL, HDL, LDLCALC, TRIG, CHOLHDL, LDLDIRECT in the last 72 hours. Thyroid Function Tests No results for input(s): TSH, T4TOTAL, T3FREE, THYROIDAB in the last 72 hours.  Invalid input(s): FREET3  Other results:   Imaging    No results found.  Medications:     Scheduled Medications: . aspirin  81 mg Oral Daily  . Chlorhexidine Gluconate Cloth  6 each Topical Daily  . enoxaparin (LOVENOX) injection  30 mg Subcutaneous Daily  . ipratropium-albuterol  3 mL Nebulization BID  . ketotifen  1 drop Right Eye BID  . levothyroxine  100 mcg Oral Q0600  . macitentan  10 mg Oral Daily  . mouth rinse  15 mL Mouth Rinse BID  . midodrine  15 mg Oral TID PC  . mometasone-formoterol  2 puff Inhalation BID  . pantoprazole  40 mg Oral Q24H  . sildenafil  40 mg Oral TID  . simvastatin  10 mg Oral q1800  . sodium chloride flush  10-40 mL Intracatheter Q12H  . sodium chloride flush  5 mL Intracatheter Q8H    Infusions: . sodium chloride 250 mL (09/16/20 1704)  . piperacillin-tazobactam (ZOSYN)  IV Stopped (09/18/20 0455)    PRN  Medications: HYDROcodone-acetaminophen, ondansetron (ZOFRAN) IV, polyethylene glycol, senna, sodium chloride flush    Assessment/Plan   1. Severe Pulmonary Hypertension with Cor Pulmonale/ Acute on Chronic Diastolic CHF - Suspect mainly WHO Group I based on w/u - echo as above with RVSP 86 mmHG - back on macitentan and sildenafil 40 tid. Continue for now.  - co-ox ok 11/25.  - Stop NE this morning.  Continue midodrine 15 mg tid.  Tolerate SBP 85 and above if no dizziness.  - Set up CVP.  Was 9 last night for nurse.  Would start back on torsemide 40 mg daily (home dose).  - Try to wean oxygen, he only wears 2L [place] at home. Repeat CXR today.   2. Acute cholecystitis - s/p percutaneous C-tube, will need 4-6 wks.  - improved. Managed by primary team - Continue Zosyn x 14 days.   3. Acute on chronic hypoxic respiratory failure on home O2 - multifactorial: PAH/diastolic HF/Interstitial Lung Disease/COPD/Asthma - CXR today.  - Wean oxygen.  - mobilize. continue pulmonary toilet  4. CAD s/p Remote CABG in 1998 - no s/s ischemia - Resumed home ASA/statin  5. Paroxysmal Atrial Fibrillation - now in NSR with PACs.  - Not on chronic anticoagulation due to history of severe diverticular bleeds requiring transfusions.   6. Moderate Aortic Stenosis - Echo 09/08/20 MG 20 mmHg, AVA 1.45 cm^2, DI 0.29   7. Thoracic Aortic Aneurysm - CTA in 10/2019 showed 4.5cm ascending thoracic aortic aneurysm. Semi-annual imaging recommended at that time.  8. AAA - History endoluminal stent graft in 07/2008 (in [place]). - Most recent ultrsound in 09/2017 showed patent aorta and iliac stent with abnormal dilatation of the mid and distal abdominal aorta (largest measurement 2.9 cm). - Due to repeat imaging. Can be done as outpatient.   9. Hypokalemia - Stable.     Length of Stay: [place], MD  09/18/2020, 7:56 AM  Advanced Heart Failure Team Pager [phone] (M-F; 7a - 4p)    Please contact [facility] for night-coverage after hours (4p -7a ) and weekends on [email or web address]

## 2020-09-18 NOTE — Progress Notes (Signed)
NAME:  Vincent Robinson, MRN:  [id number], DOB:  16-Jul-1923, LOS: 14 ADMISSION DATE:  09/07/2020, CONSULTATION DATE:  09/08/20 REFERRING MD:  Dr April Pulumbo, CHIEF COMPLAINT:  Septic shock from cholecystitis  Brief History   86 y/o M admitted 11/17 with abdominal pain.  Work up concerning for septic shock secondary to acute cholecystitis, acute hypoxic resp failure.   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 AF on monitor / EKG pending  Consults:  Surgery IR  Procedures:  Perc bili drain  Significant Diagnostic Tests:  11/18 CT ABD/Pelvis w Contrast >> acute cholecystitis, 2.3 CM enhancing urothelial carcinoma within the right posterior bladder lumen in the region of the right ureterovesicular junction  11/18 CTA Chest 11/8 >> negative for PE, morphologic changes of pulmonary arterial hypertension, elevated right heart pressures, some degree of right heart failure, stable 4.5 cm thoracic aortic aneurysm, patchy ground-glass infiltrate in LUL 11/18 ECHO >> LVEF 55-60%, moderate asymetric LV hypertrophy of the basal-septal segment, grade II diastolic dysfunction, elevated LA pressure, interventricular septum is flattened in systole and diastole consistent with RV pressure overload, RV systolic function moderately reduced, RV severely enlarged, RV systolic pressure 81, moderate to severe TR  ECHO 11/25> LVEF 60-65%, normal LV function with mild LVH. Grade II diastolic dysfunction. Elevated LA pressure.  RV systolic function is moderately reduced. RV enlarged, severely elevated pulm  artery systolic pressure. RVSP 85.9 mmHg.  Mild MVR Severe TVR Severe AV calcification and thickening, mild AVR  Micro Data:  COVID 11/18 >> negative  Influenza 11/18 >> negative BCx2 11/17 >> 1/2 with GPC's >> Staph hominis and staph lugdunesis U. Strep Antigen 11/18 >> negative  Legionella 11/18 >>  UC 11/18 >>  Gallbladder Culture 11/18 >> moderate GPC, few GPR, rare GNR >> Enterococcus faecalis, enterobacter aerogenes Blood culture 11/21> NG, final  Antimicrobials:  Vanco 11/17 >> 11/17 Cefepime 11/17 >> 11/18 Zosyn 11/18 >>   Interim history/subjective:   Transferred from [place] for further follow-up of well compensated pulmonary hypertension. Patient feels well and denies dyspnea.  Denies abdominal pain.  Objective   Blood pressure (!) 96/34, pulse (!) 57, temperature (!) 96.9 F (36.1 C), temperature source Axillary, resp. rate 16, height 5\' 7"  (1.702 m), weight 69.1 kg, SpO2 93 %. CVP:  [9 mmHg-12 mmHg] 12 mmHg      Intake/Output Summary (Last 24 hours) at 09/18/2020 1423 Last data filed at 09/18/2020 1200 Gross per 24 hour  Intake 509.7 ml  Output 1670 ml  Net -1160.3 ml   Filed Weights   09/16/20 0452 09/17/20 0500 09/18/20 0500  Weight: 71.7 kg 69.6 kg 69.1 kg    Examination: General: Elderly man sitting in chair in no distress, in good spirits HEENT: [place]/AT, eyes anicteric, oral mucosa moist Neuro: AAOx3 following commands. Globally weak/ deconditioned  CV: Reg rhythm, normal S1, 3/6 late peaking crescendo decrescendo murmur merging with the second heart sound and radiating to the carotids.  No P2 accentuation, no RV lift cap refill < 3 seconds  PULM: Symmetrical chest expansion, no accessory  use on 5 L HFNC.  Bibasilar fine crackles GI: Percutaneous biliary  drain with brown bilious output. Site clean and dry.  No abdominal pain Extremities: No obvious joint deformity. RUE PICC. No edema  Skin: thin, clean. Dry with flaking BLE  Psych: Good  insight. Pleasant mood. Engaged. Appropriate for age and situation   Resolved Hospital Problem list   Shock  Hyperglycemia   Assessment & Plan:   Was critically ill critically ill due to sepsis due to acute cholecystitis requiring titration of norepinephrine with Enterococcus and Enterobacter bacteremia On midodrine PTA -s/p perc GB drain with IR 11/18 -- cont for 4-6wk  -cont zosyn - 14d course planned -cont midodrine; increased this admission to uptitrate PH meds -OP surgery follow up in approx 1 month -Cholangiogram in approx 6wk  -Now off norepinephrine.  Severe Pulmonary HTN - chronic Emphysema  Post Inflammatory ILD  Acute on Chronic Hypoxic Respiratory Failure  OSA on CPAP  Acute pulmonary edema improving O2 requirement -Continue to wean supplemental O2 to support sats >88%. Ok to increase this during mobility efforts  -Ruthe Mannan, Duoneb -continue sildenafil TID; increased to full dose on 11/24 -continue opsumit 10mg  daily  -MAP goal > 55, plan to start fixed dose of levo and continue pulm HTN meds  -CPAP QHS  Chronic HFpEF and Cor Pulmonale (sees Dr Jeffie Pollock as an outpatient) -Appears euvolemic.  No additional diuresis today  Hypokalemia repleted 11/26 -Continue to monitor.  Suspected Urothelial Cancer (noted on CT in June 2021) -will need Urology consult prior to discharge   Hypothyroidism  TSH 0.827 on admit  -Continue synthroid   Chronic anemia currently Hb above baseline -Con't to monitor periodically -Transfuse for Hb <7 or hemodynamically significant bleeding  Constipation -Daily MiraLAX w/ hold parameters -Senna   Best practice:  Diet: regular diet Pain/Anxiety/Delirium protocol (if indicated): n/a VAP protocol (if indicated): n/a DVT prophylaxis: lovenox  GI prophylaxis: PPI Glucose control: monitor  Mobility: PT, OT  Code Status: full code   Family Communication: pt updated 11/26, daughter [name] updated 11/26.  Daughter Hilda Blades updated  11/27 Disposition: Ready for transfer to floor if clinically remains well perfused overnight off norepinephrine.  LABS    PULMONARY Recent Labs  Lab 09/13/20 1111 09/14/20 1029 09/15/20 0952 09/16/20 1337  O2SAT 66.3 69.2 76.0 97.9    CBC Recent Labs  Lab 09/12/20 0653 09/14/20 0515 09/17/20 0351  HGB 12.0* 11.2* 12.2*  HCT 36.5* 33.6* 37.7*  WBC 7.0 7.0 8.5  PLT 204 249 329    COAGULATION No results for input(s): INR in the last 168 hours.  CARDIAC  No results for input(s): TROPONINI in the last 168 hours. No results for input(s): PROBNP in the last 168 hours.   CHEMISTRY Recent Labs  Lab 09/13/20 0825 09/14/20 0515 09/15/20 0527 09/15/20 0527 09/16/20 0350 09/16/20 0350 09/16/20 1337 09/16/20 1337 09/17/20 0351 09/18/20 0124 09/18/20 0322  NA 135   < > 135  --  133*  --  134*  --  133* 134*  --   K 3.6   < > 3.0*   < > 2.8*   < > 3.2*   < > 4.4 3.8  --   CL 95*   < > 95*  --  96*  --  100  --  97* 97*  --   CO2 29   < > 29  --  26  --  23  --  26 24  --   GLUCOSE 89   < >  115*  --  114*  --  114*  --  98 94  --   BUN 15   < > 15  --  13  --  13  --  13 14  --   CREATININE 1.16   < > 1.21  --  1.08  --  0.95  --  1.27* 1.09  --   CALCIUM 7.7*   < > 7.9*  --  7.6*  --  7.6*  --  8.1* 8.3*  --   MG 1.8  --   --   --   --   --   --   --  2.1  --  2.0   < > = values in this interval not displayed.   Estimated Creatinine Clearance: 37.9 mL/min (by C-G formula based on SCr of 1.09 mg/dL).   LIVER Recent Labs  Lab 09/12/20 0653  AST 25  ALT 64*  ALKPHOS 73  BILITOT 2.4*  PROT 6.9  ALBUMIN 2.8*    INFECTIOUS No results for input(s): LATICACIDVEN, PROCALCITON in the last 168 hours.  ENDOCRINE CBG (last 3)  Recent Labs    09/16/20 1347  GLUCAP 119*    CRITICAL CARE Performed by: Kipp Brood   Total critical care time: 35 minutes  Critical care time was exclusive of separately billable procedures and treating other  patients. Critical care was necessary to treat or prevent imminent or life-threatening deterioration.  Critical care was time spent personally by me on the following activities: development of treatment plan with patient and/or surrogate as well as nursing, discussions with consultants, evaluation of patient's response to treatment, examination of patient, obtaining history from patient or surrogate, ordering and performing treatments and interventions, ordering and review of laboratory studies, ordering and review of radiographic studies, pulse oximetry and re-evaluation of patient's condition.  Kipp Brood, MD [facility] ICU Physician [place]  Pager: [phone] Or Epic Secure Chat After hours: [phone].  09/18/2020, 2:23 PM

## 2020-09-18 NOTE — Progress Notes (Signed)
RT placed pt on BIPAP dream station for the night on home setting of 14/6 w/6 Lpm bled into the system. Pt respiratory status is stable w/no distress noted at this time. RT will continue to monitor.

## 2020-09-18 NOTE — Plan of Care (Signed)
  Problem: Education: Goal: Knowledge of General Education information will improve Description Including pain rating scale, medication(s)/side effects and non-pharmacologic comfort measures Outcome: Progressing   

## 2020-09-19 ENCOUNTER — Other Ambulatory Visit: Payer: Self-pay | Admitting: Radiology

## 2020-09-19 DIAGNOSIS — J810 Acute pulmonary edema: Secondary | ICD-10-CM

## 2020-09-19 DIAGNOSIS — I2721 Secondary pulmonary arterial hypertension: Secondary | ICD-10-CM | POA: Diagnosis not present

## 2020-09-19 DIAGNOSIS — K810 Acute cholecystitis: Secondary | ICD-10-CM

## 2020-09-19 DIAGNOSIS — Z792 Long term (current) use of antibiotics: Secondary | ICD-10-CM | POA: Diagnosis not present

## 2020-09-19 DIAGNOSIS — J9621 Acute and chronic respiratory failure with hypoxia: Secondary | ICD-10-CM | POA: Diagnosis not present

## 2020-09-19 DIAGNOSIS — K819 Cholecystitis, unspecified: Secondary | ICD-10-CM | POA: Diagnosis not present

## 2020-09-19 MED ORDER — PIPERACILLIN-TAZOBACTAM IV (FOR PTA / DISCHARGE USE ONLY): 3.3750 g | Freq: Three times a day (TID) | INTRAVENOUS | 0 refills | Status: AC

## 2020-09-19 MED ORDER — MIDODRINE HCL 10 MG PO TABS
15.0000 mg | ORAL_TABLET | Freq: Three times a day (TID) | ORAL | 1 refills | Status: DC
Start: 2020-09-19 — End: 2020-12-19

## 2020-09-19 MED ORDER — POTASSIUM CHLORIDE CRYS ER 20 MEQ PO TBCR
40.0000 meq | EXTENDED_RELEASE_TABLET | Freq: Once | ORAL | Status: AC
  Administered 2020-09-19: 40 meq via ORAL
  Filled 2020-09-19: qty 2

## 2020-09-19 MED ORDER — PIPERACILLIN-TAZOBACTAM IV (FOR PTA / DISCHARGE USE ONLY): 10.1250 g | Freq: Every day | INTRAVENOUS | 0 refills | Status: AC

## 2020-09-19 MED ORDER — LEVOTHYROXINE SODIUM 100 MCG PO TABS
100.0000 ug | ORAL_TABLET | Freq: Every day | ORAL | 1 refills | Status: DC
Start: 2020-09-20 — End: 2021-07-08

## 2020-09-19 MED ORDER — PIPERACILLIN-TAZOBACTAM 3.375 G IVPB: 3.3750 g | Freq: Three times a day (TID) | INTRAVENOUS | 10 refills | Status: DC

## 2020-09-19 NOTE — Progress Notes (Signed)
Patient seen by case manager, home health arranged.  Patient seen by the IV therapy nurse, IV antibiotics administration discussed.  AVS given and instructions provided, patient own supply medications returned, left accompanied by daughter via wheelchair.

## 2020-09-19 NOTE — TOC Transition Note (Addendum)
Transition of Care [facility]) - CM/SW Discharge Note   Patient Details  Name: Vincent Robinson MRN: [id number] Date of Birth: April 21, 1923  Transition of Care [facility]) CM/SW Contact:  [name], RN Phone Number: 09/19/2020, 3:30 PM   Clinical Narrative:     NCM informed that patient is for dc today, needs [facility] services set up. NCM offered choice to patient he chose [facility] for [facility] for iv abx, and HHPT, HHOT. NCM made referral to [facility] with Alvis Lemmings , he is able to take referral.  Carolynn Sayers will also do the teaching with patient and his daughter, [name], she is actually on her way here to the hospital.  Patient is going to Sharon's address of [place] [address], her phone is [phone].  Final next level of care: [place] Barriers to Discharge: No Barriers Identified   Patient Goals and CMS Choice Patient states their goals for this hospitalization and ongoing recovery are:: to go home CMS Medicare.gov Compare Post Acute Care list provided to:: Patient Choice offered to / list presented to : Patient  Discharge Placement                       Discharge Plan and Services   Discharge Planning Services: CM Consult Post Acute Care Choice: Home Health            DME Agency: NA       HH Arranged: RN, PT, OT, IV Antibiotics HH Agency: [place] Date [facility] Agency Contacted: 09/19/20 Time [place]: [id number] Representative spoke with at [place]: [place] ([place]) Interventions     Readmission Risk Interventions Readmission Risk Prevention Plan 09/19/2020 03/25/2020 11/24/2019  Transportation Screening Complete Complete Complete  PCP or Specialist Appt within 5-7 Days - - Patient refused  Home Care Screening - - Complete  Medication Review (RN CM) - - Referral to Pharmacy  [place] or Home Care Consult Complete Patient refused -  Social Work Consult for [place] Planning/Counseling Complete - -   [place] Screening Complete Not Applicable -  Medication Review Press photographer) Complete Complete -  Some recent data might be hidden

## 2020-09-19 NOTE — TOC Progression Note (Addendum)
Transition of Care [facility]) - Progression Note    Patient Details  Name: JOEANTHONY SEELING MRN: [id number] Date of Birth: [birth year]  [facility]) CM/SW Contact  [name], RN Phone Number: 09/19/2020, 2:55 PM  Clinical Narrative:    NCM spoke with patient at bedside and his Staff RN, patient lives with spouse at home but will be going to his daughter's address at dc.  Daughter is a Therapist, sports.  Patient will have iv abx thru 12/4.  He also has a bili drain that will need to be emptied as needed.  He has a follow up apt with IR for the bili drain as well.  NCM offered choice, he chose [facility] for HHRN,HHPT, [place],  NCM made referral to [facility] with [facility] , awaiting to hear back.  NCM made referral to Carolynn Sayers with [facility] to supply the medication and teaching.  Pam will come by to see the patient today.  Patient 's daughter address is  [place], [place] [address].     Expected Discharge Plan: Home/Self Care Barriers to Discharge: No Barriers Identified  Expected Discharge Plan and Services Expected Discharge Plan: Home/Self Care   Discharge Planning Services: CM Consult   Living arrangements for the past 2 months: Single Family Home                                       Social Determinants of Health (SDOH) Interventions    Readmission Risk Interventions Readmission Risk Prevention Plan 03/25/2020 11/24/2019  Transportation Screening Complete Complete  PCP or Specialist Appt within 5-7 Days - Patient refused  Home Care Screening - Complete  Medication Review (RN CM) - Referral to Pharmacy  [place] or Home Care Consult Patient refused -  Palliative Care Screening Not Applicable -  Medication Review (RN Care Manager) Complete -  Some recent data might be hidden

## 2020-09-19 NOTE — Discharge Instructions (Addendum)
Cholecystostomy, Care After This sheet gives you information about how to care for yourself after your procedure. Your health care provider may also give you more specific instructions. If you have problems or questions, contact your health care provider. What can I expect after the procedure? After your procedure, it is common to have soreness near the incision site of your drainage tube (catheter). Follow these instructions at home: Incision care   Follow instructions from your health care provider about how to take care of your incision site where the catheter was inserted. Make sure you: ? Wash your hands with soap and water before and after you change your bandage (dressing). If soap and water are not available, use hand sanitizer. ? Change your dressing as told by your health care provider.  Check the incision site every day for signs of infection. Check for: ? Redness, swelling, or pain. ? Fluid or blood. ? Warmth. ? Pus or a bad smell.  Do not take baths, swim, or use a hot tub until your health care provider approves. Ask your health care provider if you may take showers. You may only be allowed to take sponge baths. General instructions  Follow instructions from your health care provider about how to care for your catheter and collection bag at home.  Your health care provider will show you: ? How to record the amount of drainage from the catheter. ? How to flush the catheter. ? How to care for the catheter incision site.  Follow instructions from your health care provider about eating or drinking restrictions.  Take over-the-counter and prescription medicines only as told by your health care provider.  Keep all follow-up visits as told by your health care provider. This is important. Contact a health care provider if:  You have redness, swelling, or pain around the catheter incision site.  You have nausea or vomiting. Get help right away if:  Your abdominal pain  gets worse.  You feel dizzy or you faint while standing.  You have fluid or blood coming from the catheter incision site.  The area around the catheter incision site feels warm to the touch.  You have pus or a bad smell coming from the catheter incision site.  You have a fever.  You have shortness of breath.  You have a rapid heartbeat.  Your nausea or vomiting does not go away.  Your catheter becomes blocked.  Your catheter comes out of your abdomen. Summary  After your procedure, it is common to have soreness near the incision site of your drainage tube (catheter).  Wash your hands with soap and water before and after you change your bandage (dressing). Change your dressing as told by your health care provider.  Check the catheter incision site every day for signs of infection. Check for redness, swelling, pain, fluid, blood, warmth, pus, or a bad smell.  Contact your health care provider if you have nausea or vomiting, or if you have redness, swelling, or pain around your catheter incision site.  Get help right away if your abdominal pain gets worse, you feel dizzy, you have blood or fluid coming from the catheter incision site, you have a fever, or you have shortness of breath. This information is not intended to replace advice given to you by your health care provider. Make sure you discuss any questions you have with your health care provider. Document Revised: 05/05/2018 Document Reviewed: 05/05/2018 Elsevier Patient Education  2020 Elsevier Inc.  

## 2020-09-19 NOTE — Progress Notes (Signed)
PHARMACY CONSULT NOTE FOR:  OUTPATIENT  PARENTERAL ANTIBIOTIC THERAPY (OPAT)  Indication: cholecystitis Regimen: Zosyn 3.375g IV q8h End date: 09/23/20  IV antibiotic discharge orders are pended. To discharging provider:  please sign these orders via discharge navigator,  Select New Orders & click on the button choice - Manage This Unsigned Work.     Thank you for allowing pharmacy to be a part of this patient's care.  Arrie Senate, PharmD, BCPS, [facility] Clinical Pharmacist [phone] Please check AMION for all [place] numbers 09/19/2020

## 2020-09-19 NOTE — Progress Notes (Signed)
[facility] ([facility]) Community Based Palliative Care  This patient is enrolled in our palliative care services in the community.   [facility] will continue to follow for any discharge planning needs and to coordinate continuation of palliative care. If you have questions or need assistance, please call [phone] or contact the hospital Liaison listed on AMION.  Sherry Gibson, RN [facility] Hospital Liaison [phone] 

## 2020-09-19 NOTE — Progress Notes (Signed)
Occupational Therapy Treatment Patient Details Name: Vincent Robinson MRN: [id number] DOB: [birth year] Today's Date: 09/19/2020    History of present illness 86 year old with asthma, ILD, pulmonary hypertension, OSA, CABG Admitted 09/07/20  with septic shock, acute cholecystitis. S/p perc chole drain placement by IR 11/18.  [facility] on 11/26 due to pulmonary HTN.    OT comments  Pt progressing towards established OT goals and is very motivated to participate in therapy to return to PLOF and home. Pt agreeable to mobility in hallway. Pt performing functional transfers with Min Guard A and functional mobility with Min Guard-Min A using RW. Pt stating "I almost feel normal!" Pt requiring 6L O2 to maintain SpO2 >90% during activity. Continue to recommend dc to home with HHOT and will continue to follow acutely as admitted.     Follow Up Recommendations  Home health OT;Supervision/Assistance - 24 hour    Equipment Recommendations  None recommended by OT    Recommendations for Other Services      Precautions / Restrictions Precautions Precautions: Fall Precaution Comments: R flank drain, monitor O2       Mobility Bed Mobility               General bed mobility comments: Up in chair on arrival  Transfers Overall transfer level: Needs assistance Equipment used: Rolling walker (2 wheeled) Transfers: Sit to/from Stand Sit to Stand: Min guard         General transfer comment: Min Guard A for safety in standing    Balance Overall balance assessment: Needs assistance Sitting-balance support: No upper extremity supported Sitting balance-Leahy Scale: Good     Standing balance support: Bilateral upper extremity supported Standing balance-Leahy Scale: Poor Standing balance comment: reliant on RW                           ADL either performed or assessed with clinical judgement   ADL Overall ADL's : Needs assistance/impaired                      Lower Body Dressing: Minimal assistance;Sit to/from stand Lower Body Dressing Details (indicate cue type and reason): Pt able to bend forward to adjust socks while seated in recliner Toilet Transfer: Minimal assistance;Ambulation;RW;Min guard (simulated to recliner) Toilet Transfer Details (indicate cue type and reason): Min Guard A - Min A for balance         Functional mobility during ADLs: Minimal assistance;Rolling walker General ADL Comments: Pt very motivated. Agreeable to mobility in hallway with Min Guard-Min A and RW.      Vision       Perception     Praxis      Cognition Arousal/Alertness: Awake/alert Behavior During Therapy: WFL for tasks assessed/performed Overall Cognitive Status: Within Functional Limits for tasks assessed                                 General Comments: AxO x 3 pleasant/motivated        Exercises     Shoulder Instructions       General Comments BP 102/ 52, HR 104, SpO2 100% on 6L. Attempt to perform mobility in 4L but pt quickly dropping to 90%; return to 6L    Pertinent Vitals/ Pain       Pain Assessment: No/denies pain  Home Living  Prior Functioning/Environment              Frequency  Min 2X/week        Progress Toward Goals  OT Goals(current goals can now be found in the care plan section)  Progress towards OT goals: Progressing toward goals  Acute Rehab OT Goals Patient Stated Goal: to go home. visit his wife OT Goal Formulation: With patient Time For Goal Achievement: 09/25/20 Potential to Achieve Goals: Good ADL Goals Pt Will Transfer to Toilet: with supervision;ambulating;regular height toilet;grab bars Pt Will Perform Toileting - Clothing Manipulation and hygiene: with supervision;sit to/from stand Additional ADL Goal #1: Patient will perform 10 min functional activity or exercise activity as evidence of improving activity  tolerance  Plan Discharge plan remains appropriate    Co-evaluation                 AM-PAC OT "6 Clicks" Daily Activity     Outcome Measure   Help from another person eating meals?: A Little Help from another person taking care of personal grooming?: A Little Help from another person toileting, which includes using toliet, bedpan, or urinal?: A Little Help from another person bathing (including washing, rinsing, drying)?: A Little Help from another person to put on and taking off regular upper body clothing?: A Little Help from another person to put on and taking off regular lower body clothing?: A Lot 6 Click Score: 17    End of Session Equipment Utilized During Treatment: Rolling walker;Oxygen  OT Visit Diagnosis: Unsteadiness on feet (R26.81)   Activity Tolerance Patient tolerated treatment well   Patient Left with call bell/phone within reach;in chair   Nurse Communication Mobility status        Time: 3419-3790 OT Time Calculation (min): 27 min  Charges: OT General Charges $OT Visit: 1 Visit OT Treatments $Therapeutic Activity: 23-37 mins  [place], OTR/L Acute Rehab Pager: [phone] Office: [place] 09/19/2020, 1:11 PM

## 2020-09-19 NOTE — Discharge Summary (Signed)
Physician Discharge Summary  Patient ID: Vincent Robinson MRN: [id number] DOB/AGE: [birth year] 84 y.o.  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  Work up concerning for septic shock secondary to acute cholecystitis, patient was also found to be in acute hypoxic resp failure requiring up to 10 L [place]. Percutaneous gallbladder drain placed with IR 11/18.  1/2 Blood Cx 11/17 with staph hominis and staph lugdunesis thought to be contaminate.  Gallbladder cultures grew enterococcus and enterobacter. He has been treated with Zosyn, which he will complete on 12/3 for a total of 14 day course. PICC line in place for antibiotics and will need to be removed after antibiotics course completed.  For his pulmonary hypertension and chronic HFpEF, he was continued on sildenafil and diuresis.  Oxygen requirements improved with diuresis and patient has returned to 2-3 L [place].  He did require increase in midodrine to 15 mg TID due to persistent levophed requirements which were stopped on 11/28. Patient reviewed by Dr Haroldine Laws and Dr Carlis Abbott who both agree patient is stable for discharge.  Discharge instruction reviewed at bedside with patient and Dr Carlis Abbott.   Incidentally, patient was noted on imaging in June of this year to have evolving bladder lesion,  he will need outpatient urology follow up.   Follow up appointments have been listed below:  General Surgery Interventional radiology Urology [facility].   Consults: pulmonary/intensive care, general surgery and radiology  Significant Diagnostic Studies:  11/18 CT ABD/Pelvis w Contrast >> acute cholecystitis, 2.3 CM enhancing urothelial carcinoma within the right posterior bladder lumen in the region of the right ureterovesicular junction  11/18 CTA Chest 11/8 >> negative for PE, morphologic changes of pulmonary arterial hypertension, elevated right heart pressures, some degree of right heart failure, stable 4.5 cm thoracic aortic aneurysm, patchy ground-glass infiltrate in LUL 11/18 ECHO >> LVEF 55-60%, moderate asymetric LV hypertrophy of the basal-septal segment, grade II diastolic dysfunction, elevated LA pressure, interventricular septum is flattened in systole and diastole consistent with RV pressure overload, RV systolic function moderately reduced, RV severely enlarged, RV systolic pressure 81, moderate to severe TR ECHO 11/25> LVEF 60-65%, normal LV function with mild LVH. Grade II diastolic dysfunction. Elevated LA pressure. RV systolic function is moderately reduced. RV enlarged, severely elevated pulm artery systolic pressure. RVSP 85.9 mmHg.  Mild MVR. Severe TVR. Severe AV calcification and thickening, mild AVR  Treatments: antibiotics: Zosyn and PT, OT  Discharge Exam:  Blood Pressure: 107/62, pulse 49, temperature 97.5, temperature source oral. resp rate 20. SpO2 100% on [place] Head: Normocephalic, without obvious abnormality, atraumatic Resp: clear to auscultation bilaterally and decreased at bases Cardio: irregularly irregular rhythm, S1: normal and systolic murmur: late systolic 2/6, crescendo right upper sternal border GI: soft, non-tender; bowel sounds normal; no masses,  no organomegaly Neurologic: Mental status: Alert, oriented, thought content  appropriate Gallbladder  drain in place  Disposition: Discharge disposition: 06-Home-Health Care Svc       Discharge Instructions    [facility] pharmacist to adjust dose for Vancomycin, Aminoglycosides and other anti-infective therapies as requested by physician.   Complete by: As directed    [facility] pharmacist to adjust dose for Vancomycin, Aminoglycosides and other anti-infective therapies as requested by physician.   Complete by: As directed    [facility] to provide Cath Flo 2mg    Complete by: As directed    Administer for PICC line occlusion and as ordered by physician for other access device issues.   [facility] to provide Cath Flo 2mg    Complete by: As directed    Administer for PICC line occlusion and as ordered by physician for other access device issues.   Anaphylaxis Kit: Provided to treat any anaphylactic reaction to the medication being provided to the patient if First Dose or when requested by physician   Complete by: As directed    Epinephrine 1mg /ml vial / amp: Administer 0.3mg  (0.51ml) subcutaneously once for moderate to severe anaphylaxis, nurse to call physician and pharmacy when reaction occurs and call 911 if needed for immediate care   Diphenhydramine 50mg /ml IV vial: Administer 25-50mg  IV/IM PRN for first dose reaction, rash, itching, mild reaction, nurse to call physician and pharmacy when reaction occurs   Sodium Chloride 0.9% NS 58ml IV: Administer if needed for hypovolemic blood pressure drop or as ordered by physician after call to physician with anaphylactic reaction   Anaphylaxis Kit: Provided to treat any anaphylactic reaction to the medication being provided to the patient if First Dose or when requested by physician   Complete by: As directed    Epinephrine 1mg /ml vial / amp: Administer 0.3mg  (0.65ml) subcutaneously once for moderate to severe anaphylaxis, nurse to call physician and pharmacy when reaction  occurs and call 911 if needed for immediate care   Diphenhydramine 50mg /ml IV vial: Administer 25-50mg  IV/IM PRN for first dose reaction, rash, itching, mild reaction, nurse to call physician and pharmacy when reaction occurs   Sodium Chloride 0.9% NS 552ml IV: Administer if needed for hypovolemic blood pressure drop or as ordered by physician after call to physician with anaphylactic reaction   Change dressing on IV access line weekly and PRN   Complete by: As directed    Change dressing on IV access line weekly and PRN   Complete by: As directed    Flush IV access with Sodium Chloride 0.9% and Heparin 10 units/ml or 100 units/ml   Complete by: As directed    Flush IV access with Sodium Chloride 0.9% and Heparin 10 units/ml or 100 units/ml   Complete by: As directed    Home infusion instructions - [facility]   Complete by: As directed    Instructions: Flush IV access with Sodium Chloride 0.9% and Heparin 10units/ml or 100units/ml   Change dressing on IV access line: Weekly and PRN   Instructions Cath Flo 2mg : Administer for PICC Line occlusion and as ordered by physician for other access device   [facility] pharmacist to adjust dose for: Vancomycin, Aminoglycosides and other anti-infective therapies as requested by physician   Home infusion instructions - [facility]   Complete by: As directed    Instructions: Flush IV access with Sodium Chloride 0.9% and Heparin 10units/ml or 100units/ml   Change dressing on IV access line: Weekly and PRN   Instructions Cath Flo 2mg : Administer for  PICC Line occlusion and as ordered by physician for other access device   [facility] pharmacist to adjust dose for: Vancomycin, Aminoglycosides and other anti-infective therapies as requested by physician   Method of administration may be changed at the discretion of home infusion pharmacist based upon assessment of the patient and/or caregiver's ability to  self-administer the medication ordered   Complete by: As directed    Method of administration may be changed at the discretion of home infusion pharmacist based upon assessment of the patient and/or caregiver's ability to self-administer the medication ordered   Complete by: As directed    Outpatient Parenteral Antibiotic Therapy Information Antibiotic: Piperacillin-Tazobactam (Zosyn) IVPB; Indications for use: cholecystitis; End Date: 09/23/2020   Complete by: As directed    Antibiotic: Piperacillin-Tazobactam (Zosyn) IVPB   Indications for use: cholecystitis   End Date: 09/23/2020     Allergies as of 09/19/2020   No Known Allergies     Medication List    TAKE these medications   albuterol 108 (90 Base) MCG/ACT inhaler Commonly known as: VENTOLIN HFA Inhale 1 puff into the lungs 2 (two) times daily.   ferrous sulfate 325 (65 FE) MG tablet Take 1 tablet (325 mg total) by mouth 2 (two) times daily with a meal.   fluticasone 27.5 MCG/SPRAY nasal spray Commonly known as: VERAMYST Place 1 spray into the nose daily as needed for rhinitis.   Fluticasone-Salmeterol(sensor) 113-14 MCG/ACT Aepb Inhale 1 puff into the lungs 2 (two) times daily.   levothyroxine 100 MCG tablet Commonly known as: SYNTHROID Take 1 tablet (100 mcg total) by mouth daily at 6 (six) AM. Start taking on: September 20, 2020 What changed:   medication strength  how much to take  when to take this   loratadine 10 MG tablet Commonly known as: CLARITIN Take 10 mg by mouth daily as needed for allergies.   midodrine 10 MG tablet Commonly known as: PROAMATINE Take 1.5 tablets (15 mg total) by mouth 3 (three) times daily after meals. What changed:   how much to take  when to take this   Opsumit 10 MG tablet Generic drug: macitentan Take 1 tablet (10 mg total) by mouth daily. Needs appt for future refills   pantoprazole 20 MG tablet Commonly known as: PROTONIX Take 20 mg by mouth 2 (two) times daily.    piperacillin-tazobactam  IVPB Commonly known as: ZOSYN Inject 3.375 g into the vein every 8 (eight) hours for 4 days. Indication:  cholecystitis First Dose: Yes Last Day of Therapy:  09/24/20 Labs - Once weekly:  n/a Labs - Every other week:  n/a Method of administration: Elastomeric (Continuous infusion) Method of administration may be changed at the discretion of home infusion pharmacist based upon assessment of the patient and/or caregiver's ability to self-administer the medication ordered.   piperacillin-tazobactam  IVPB Commonly known as: ZOSYN Inject 10.125 g into the vein daily for 4 days. As a continuous infusion. Indication:  Cholecystitis  First Dose: No Last Day of Therapy:  09/23/2020 Labs - Once weekly:  CBC/D and BMP, Labs - Every other week:  ESR and CRP Method of administration: Elastomeric (Continuous infusion) Method of administration may be changed at the discretion of home infusion pharmacist based upon assessment of the patient and/or caregiver's ability to self-administer the medication ordered.   piperacillin-tazobactam 3.375 GM/50ML IVPB Commonly known as: ZOSYN Inject 50 mLs (3.375 g total) into the vein every 8 (eight) hours.   potassium chloride 20 MEQ/15ML (10%) Soln Take  15 mLs (20 mEq total) by mouth 2 (two) times daily.   sildenafil 20 MG tablet Commonly known as: REVATIO Take 2 tablets (40 mg total) by mouth 3 (three) times daily.   simvastatin 10 MG tablet Commonly known as: ZOCOR Take 10 mg by mouth at bedtime.   Singulair 10 MG tablet Generic drug: montelukast Take 10 mg by mouth at bedtime.   torsemide 20 MG tablet Commonly known as: DEMADEX Take 2 tablets (40 mg total) by mouth daily.            Discharge Care Instructions  (From admission, onward)         Start     Ordered   09/19/20 0000  Change dressing on IV access line weekly and PRN  (Home infusion instructions - [facility] )        09/19/20 1422    09/19/20 0000  Change dressing on IV access line weekly and PRN  (Home infusion instructions - [facility] )        09/19/20 1552          Follow-up Information    Clovis Riley, MD. Go on 10/12/2020.   Specialty: General Surgery Why: Your appointment is 12/22 at 10:10 am Please arrive 30 minutes prior to your appointment to check in and fill out paperwork. Bring photo ID and insurance information. Contact information: [address] [place] [address] [phone]        Arne Cleveland, MD. Go in 4 week(s).   Specialties: Interventional Radiology, Radiology Why: Clinic will call you to schedule follow up. Contact information: [address] [place] [address] [phone]        [facility] Follow up on 09/29/2020.   Specialty: Cardiology Why: 10:00 AM  [facility] Parking Garage Code 5555 Contact information: [address] [id number] [place] [phone]       [facility] Follow up.   Specialty: Home Health Services Why: HHRN, HHPT, HHOT  Contact information: [address] [address] [phone]        [facility] Follow up.   Why: outpatient palliatibve services Contact information: [place] [phone]       [facility] Follow up.   Why: Enhancing lesion on right side of bladder found on CT Scan in June, recommend follow up with Urology as outpatient              Signed: Minda Ditto 09/19/2020, 4:22 PM

## 2020-09-19 NOTE — Progress Notes (Addendum)
Patient ID: Vincent Robinson, male   DOB: 12/27/1922, 86 y.o.   MRN: [id number]     Advanced Heart Failure Rounding Note  PCP-Cardiologist: Quay Burow, MD   Subjective:    No events overnight.   OOB sitting in chair. Feels well. Eager to go home.    Echo: EF 60-65%, moderately dilated and moderately dysfunctional RV, PASP 86 mmHg, moderate AS.   Objective:   Weight Range: 68.7 kg Body mass index is 23.72 kg/m.   Vital Signs:   Temp:  [96.9 F (36.1 C)-97.9 F (36.6 C)] 97.8 F (36.6 C) (11/28 2336) Pulse Rate:  [45-99] 56 (11/29 0600) Resp:  [14-27] 16 (11/29 0600) BP: (77-100)/(34-61) 95/45 (11/29 0600) SpO2:  [88 %-100 %] 91 % (11/29 0600) Weight:  [68.7 kg] 68.7 kg (11/29 0500) Last BM Date: 09/18/20  Weight change: Filed Weights   09/17/20 0500 09/18/20 0500 09/19/20 0500  Weight: 69.6 kg 69.1 kg 68.7 kg    Intake/Output:   Intake/Output Summary (Last 24 hours) at 09/19/2020 0754 Last data filed at 09/19/2020 0100 Gross per 24 hour  Intake 216.7 ml  Output 1400 ml  Net -1183.3 ml      Physical Exam    PHYSICAL EXAM: General:  Well appearing, elderly male sitting up in chair. Kyphotic posture No respiratory difficulty HEENT: normal Neck: supple. no JVD. Carotids 2+ bilat; no bruits. No lymphadenopathy or thyromegaly appreciated. Cor: PMI nondisplaced. Irregularly irregular rhythm and rate. 2/6 SEM RUSB murmurs. Lungs: decreases BS at bases bilaterally  Abdomen: soft, nontender, nondistended. No hepatosplenomegaly. No bruits or masses. Good bowel sounds. Extremities: no cyanosis, clubbing, rash, edema Neuro: alert & oriented x 3, cranial nerves grossly intact. moves all 4 extremities w/o difficulty. Affect pleasant.    Telemetry   Afib 80s (personally reviewed).   Labs    CBC Recent Labs    09/17/20 0351  WBC 8.5  HGB 12.2*  HCT 37.7*  MCV 97.9  PLT 782   Basic Metabolic Panel Recent Labs    09/17/20 0351 09/18/20 0124  09/18/20 0322  NA 133* 134*  --   K 4.4 3.8  --   CL 97* 97*  --   CO2 26 24  --   GLUCOSE 98 94  --   BUN 13 14  --   CREATININE 1.27* 1.09  --   CALCIUM 8.1* 8.3*  --   MG 2.1  --  2.0   Liver Function Tests No results for input(s): AST, ALT, ALKPHOS, BILITOT, PROT, ALBUMIN in the last 72 hours. No results for input(s): LIPASE, AMYLASE in the last 72 hours. Cardiac Enzymes No results for input(s): CKTOTAL, CKMB, CKMBINDEX, TROPONINI in the last 72 hours.  BNP: BNP (last 3 results) Recent Labs    09/14/20 0515 09/15/20 0527 09/16/20 1337  BNP 1,035.8* 1,215.7* 370.2*    ProBNP (last 3 results) No results for input(s): PROBNP in the last 8760 hours.   D-Dimer No results for input(s): DDIMER in the last 72 hours. Hemoglobin A1C No results for input(s): HGBA1C in the last 72 hours. Fasting Lipid Panel No results for input(s): CHOL, HDL, LDLCALC, TRIG, CHOLHDL, LDLDIRECT in the last 72 hours. Thyroid Function Tests No results for input(s): TSH, T4TOTAL, T3FREE, THYROIDAB in the last 72 hours.  Invalid input(s): FREET3  Other results:   Imaging    DG CHEST PORT 1 VIEW  Result Date: 09/18/2020 CLINICAL DATA:  Congestive heart failure, fever. EXAM: PORTABLE CHEST 1 VIEW COMPARISON:  September 14, 2020 FINDINGS: Stable cardiomediastinal silhouette. Stable postsurgical changes from prior CABG. Low lung volumes.  Chronic coarsening of the interstitium. Osseous structures are without acute abnormality. Soft tissues are grossly normal. IMPRESSION: 1. Low lung volumes with chronic coarsening of the interstitium. 2. No definite focal airspace consolidation. Electronically Signed   By: [name] M.D.   On: 09/18/2020 09:34     Medications:     Scheduled Medications: . aspirin  81 mg Oral Daily  . Chlorhexidine Gluconate Cloth  6 each Topical Daily  . enoxaparin (LOVENOX) injection  30 mg Subcutaneous Daily  . ipratropium-albuterol  3 mL Nebulization BID  .  ketotifen  1 drop Right Eye BID  . levothyroxine  100 mcg Oral Q0600  . macitentan  10 mg Oral Daily  . mouth rinse  15 mL Mouth Rinse BID  . midodrine  15 mg Oral TID PC  . mometasone-formoterol  2 puff Inhalation BID  . pantoprazole  40 mg Oral Q24H  . sildenafil  40 mg Oral TID  . simvastatin  10 mg Oral q1800  . sodium chloride flush  10-40 mL Intracatheter Q12H  . sodium chloride flush  5 mL Intracatheter Q8H  . torsemide  40 mg Oral Daily    Infusions: . sodium chloride 250 mL (09/16/20 1704)  . piperacillin-tazobactam (ZOSYN)  IV 3.375 g (09/19/20 0100)    PRN Medications: HYDROcodone-acetaminophen, ondansetron (ZOFRAN) IV, polyethylene glycol, senna, sodium chloride flush    Assessment/Plan   1. Severe Pulmonary Hypertension with Cor Pulmonale/ Acute on Chronic Diastolic CHF - Suspect mainly WHO Group I based on w/u - echo as above with RVSP 86 mmHG - back on macitentan and sildenafil 40 tid. Continue for now.  - co-ox ok 11/25.  - Continue midodrine 15 mg tid for BP support.  Tolerate SBP 85 and above if no dizziness.  - Continue torsemide 40 mg daily (home dose).  - Try to wean oxygen, he only wears 2L [place] at home.  2. Acute cholecystitis - s/p percutaneous C-tube, will need 4-6 wks.  - improved. Managed by primary team - Continue Zosyn x 14 days.   3. Acute on chronic hypoxic respiratory failure on home O2 - multifactorial: PAH/diastolic HF/Interstitial Lung Disease/COPD/Asthma - CXR yesterday showed low lung volumes with chronic coarsening of the interstitium. No definite focal airspace consolidation. - Wean oxygen.  - mobilize. continue pulmonary toilet  4. CAD s/p Remote CABG in 1998 - no s/s ischemia - Resumed home ASA/statin  5. Paroxysmal Atrial Fibrillation - in and out of Afib and NSR w/ frequent PACs.  - Not on chronic anticoagulation due to history of severe diverticular bleeds requiring transfusions.   6. Moderate Aortic Stenosis - Echo  09/08/20 MG 20 mmHg, AVA 1.45 cm^2, DI 0.29   7. Thoracic Aortic Aneurysm - CTA in 10/2019 showed 4.5cm ascending thoracic aortic aneurysm. Semi-annual imaging recommended at that time.  8. AAA - History endoluminal stent graft in 07/2008 (in [place]). - Most recent ultrsound in 09/2017 showed patent aorta and iliac stent with abnormal dilatation of the mid and distal abdominal aorta (largest measurement 2.9 cm). - Due to repeat imaging. Can be done as outpatient.   9. Hypokalemia - resolved. K 3.8  Dispo: home today. Will arrange clinic f/u   Cardiac/ PH meds for discharge  ASA 81 mg daily  macitentan 10 mg daily  Midodrine 15 mg tid Sildenafil 40 mg tid Simvastatin 10 mg qhs  Torsemide 40 mg daily  Length of Stay: [address] PA-C  09/19/2020, 7:54 AM  Advanced Heart Failure Team Pager [phone] (M-F; 7a - 4p)  Please contact [facility] for night-coverage after hours (4p -7a ) and weekends on [email or web address]  Patient seen and examined with the above-signed Advanced Practice Provider and/or Housestaff. I personally reviewed laboratory data, imaging studies and relevant notes. I independently examined the patient and formulated the important aspects of the plan. I have edited the note to reflect any of my changes or salient points. I have personally discussed the plan with the patient and/or family.  Looks and feels much better. Eager to go home. BP soft but stable. Oxygenation improved.   General:  Elderly male No resp difficulty HEENT: normal Neck: supple. no JVD. Carotids 2+ bilat; + bruits. No lymphadenopathy or thryomegaly appreciated. Cor: PMI nondisplaced. Regular rate & rhythm. 2/6 AS 2/6 TR Lungs: coarse Abdomen: soft, nontender, nondistended. No hepatosplenomegaly. No bruits or masses. Good bowel sounds. Chole tube draining bilious fluid Extremities: no cyanosis, clubbing, rash, edema Neuro: alert & orientedx3, cranial nerves grossly intact. moves  all 4 extremities w/o difficulty. Affect pleasant  He is much improved. Stable for d/c from our standpoint with HHPT/OT support. D/w CCM team.   Glori Bickers, MD  8:12 AM

## 2020-09-19 NOTE — TOC Initial Note (Signed)
Transition of Care [facility]) - Initial/Assessment Note    Patient Details  Name: Vincent Robinson MRN: [id number] Date of Birth: 1923/05/23  Transition of Care [facility]) CM/SW Contact:    [name], RN Phone Number: 09/19/2020, 3:24 PM  Clinical Narrative:                 NCM informed that patient is for dc today, needs [facility] services set up. NCM offered choice to patient he chose [facility] for [facility] for iv abx, and HHPT, HHOT. NCM made referral to [facility] with Alvis Lemmings , he is able to take referral.  Carolynn Sayers will also do the teaching with patient and his daughter, [name], she is actually on her way here to the hospital.  Patient is going to Sharon's address of [address] [place] [address], her phone is [phone].    Expected Discharge Plan: [place] Barriers to Discharge: No Barriers Identified   Patient Goals and CMS Choice Patient states their goals for this hospitalization and ongoing recovery are:: to go home CMS Medicare.gov Compare Post Acute Care list provided to:: Patient Choice offered to / list presented to : Patient  Expected Discharge Plan and Services Expected Discharge Plan: [place]   Discharge Planning Services: CM Consult Post Acute Care Choice: [place] arrangements for the past 2 months: Single Family Home                   DME Agency: NA       HH Arranged: RN, PT, OT, IV Antibiotics HH Agency: [place] Date [facility] Agency Contacted: 09/19/20 Time HH Agency Contacted: 20 Representative spoke with at [place]: Tommi Rumps  Prior Living Arrangements/Services Living arrangements for the past 2 months: [place] with:: Spouse Patient language and need for interpreter reviewed:: Yes Do you feel safe going back to the place where you live?: Yes      Need for Family Participation in Patient Care: Yes (Comment) Care giver support system in place?: Yes (comment)   Criminal  Activity/Legal Involvement Pertinent to Current Situation/Hospitalization: No - Comment as needed  Activities of Daily Living Home Assistive Devices/Equipment: BIPAP, Cane (specify quad or straight), Dentures (specify type), Walker (specify type), Other (Comment) (tub/shower unit, single point cane, walk-in shower, 2 wheeled walker, standard toilet) ADL Screening (condition at time of admission) Patient's cognitive ability adequate to safely complete daily activities?: Yes Is the patient deaf or have difficulty hearing?: No Does the patient have difficulty seeing, even when wearing glasses/contacts?: No Does the patient have difficulty concentrating, remembering, or making decisions?: No Patient able to express need for assistance with ADLs?: Yes Does the patient have difficulty dressing or bathing?: Yes Independently performs ADLs?: No Communication: Independent Dressing (OT): Needs assistance Is this a change from baseline?: Change from baseline, expected to last >3 days Grooming: Needs assistance Is this a change from baseline?: Change from baseline, expected to last >3 days Feeding: Independent Bathing: Needs assistance Is this a change from baseline?: Change from baseline, expected to last >3 days Toileting: Needs assistance Is this a change from baseline?: Change from baseline, expected to last >3days In/Out Bed: Needs assistance Is this a change from baseline?: Change from baseline, expected to last >3 days Walks in Home: Needs assistance Is this a change from baseline?: Change from baseline, expected to last >3 days Does the patient have difficulty walking or climbing stairs?: Yes (secondary to  weakness and illness) Weakness of Legs: Both Weakness of Arms/Hands: None  Permission Sought/Granted                  Emotional Assessment Appearance:: Appears stated age Attitude/Demeanor/Rapport: Engaged Affect (typically observed): Appropriate Orientation: : Oriented to  Self, Oriented to Place, Oriented to  Time, Oriented to Situation Alcohol / Substance Use: Not Applicable Psych Involvement: No (comment)  Admission diagnosis:  Acute cholecystitis [K81.0] Septic shock ([place]) [A41.9, R65.21] Community acquired pneumonia of left upper lobe of lung [J18.9] Patient Active Problem List   Diagnosis Date Noted  . Acute cholecystitis 09/08/2020  . Diverticular hemorrhage   . Rectal bleeding 03/22/2020  . Acute GI bleeding 03/22/2020  . Hypokalemia   . Hypotension   . Acute respiratory failure with hypoxia ([place]) 12/18/2019  . Acute on chronic right-sided heart failure ([place]) 12/18/2019  . Pressure injury of skin 11/24/2019  . Chronic respiratory failure ([place])   . Acute on chronic respiratory failure ([place]) 11/23/2019  . Right heart failure ([place])   . S/P CABG (coronary artery bypass graft) 11/22/2019  . Chest pain 11/22/2019  . Atrial fibrillation ([place]) 08/04/2019  . Pulmonary hypertension, unspecified ([place]) 10/11/2017  . Aortic stenosis, moderate 10/11/2017  . Coronary artery disease 08/30/2017  . AAA (abdominal aortic aneurysm) without rupture ([place]) 08/30/2017  . Acute blood loss anemia 06/07/2017  . Hemorrhagic shock ([place]) 06/07/2017  . Lower GI bleed 06/05/2017  . Sleep apnea 06/05/2017  . Hypothyroidism 06/05/2017  . Hyperlipidemia 06/05/2017  . Asthma 06/05/2017  . ILD (interstitial lung disease) ([place]) 05/28/2017  . Pneumothorax on right 05/20/2017  . COPD (chronic obstructive pulmonary disease) ([place]) 05/20/2017  . Pneumothorax 05/20/2017  . Skin cancer 10/23/2015   PCP:  Katherina Mires, MD Pharmacy:   [id number] [place], [place] [address] Phone: [phone] Fax: [phone]     Social Determinants of Health (SDOH) Interventions    Readmission Risk Interventions Readmission Risk Prevention Plan 09/19/2020 03/25/2020 11/24/2019  Transportation Screening Complete Complete  Complete  PCP or Specialist Appt within 5-7 Days - - Patient refused  Home Care Screening - - Complete  Medication Review (RN CM) - - Referral to Pharmacy  HRI or Home Care Consult Complete Patient refused -  Social Work Consult for [place] Planning/Counseling Complete - -  [place] Screening Complete Not Applicable -  Medication Review (RN Care Manager) Complete Complete -  Some recent data might be hidden

## 2020-09-19 NOTE — Progress Notes (Signed)
Supervising Physician: Dr. Annamaria Boots  Patient Status:  [facility]-In[facility]  Chief Complaint:  cholecystitis  Subjective: Pt resting quietly, no acute c/o; daughter in room;  Feels good.  States he is supposed to be discharged today   Allergies: Patient has no known allergies.  Medications: Prior to Admission medications   Medication Sig Start Date End Date Taking? Authorizing Provider  albuterol (PROVENTIL HFA;VENTOLIN HFA) 108 (90 Base) MCG/ACT inhaler Inhale 1 puff into the lungs 2 (two) times daily. 05/20/17  Yes Mannam, Praveen, MD  fluticasone (VERAMYST) 27.5 MCG/SPRAY nasal spray Place 1 spray into the nose daily as needed for rhinitis.    Yes [provider]  Fluticasone-Salmeterol,sensor, 113-14 MCG/ACT AEPB Inhale 1 puff into the lungs 2 (two) times daily.   Yes [provider]  levothyroxine (SYNTHROID, LEVOTHROID) 112 MCG tablet Take 112 mcg by mouth daily before breakfast.   Yes [provider]  loratadine (CLARITIN) 10 MG tablet Take 10 mg by mouth daily as needed for allergies.    Yes [provider]  macitentan (OPSUMIT) 10 MG tablet Take 1 tablet (10 mg total) by mouth daily. Needs appt for future refills 08/19/20  Yes Bensimhon, Shaune Pascal, MD  montelukast (SINGULAIR) 10 MG tablet Take 10 mg by mouth at bedtime.   Yes [provider]  pantoprazole (PROTONIX) 20 MG tablet Take 20 mg by mouth 2 (two) times daily.    Yes [provider]  simvastatin (ZOCOR) 10 MG tablet Take 10 mg by mouth at bedtime.   Yes [provider]  torsemide (DEMADEX) 20 MG tablet Take 2 tablets (40 mg total) by mouth daily. 03/03/20  Yes Bensimhon, Shaune Pascal, MD  ferrous sulfate 325 (65 FE) MG tablet Take 1 tablet (325 mg total) by mouth 2 (two) times daily with a meal. Patient not taking: Reported on 09/07/2020 01/28/20   Bensimhon, Shaune Pascal, MD  midodrine (PROAMATINE) 10 MG tablet Take 1 tablet (10 mg total) by mouth 3 (three) times  daily. Patient not taking: Reported on 09/07/2020 03/03/20   Bensimhon, Shaune Pascal, MD  potassium chloride 20 MEQ/15ML (10%) SOLN Take 15 mLs (20 mEq total) by mouth 2 (two) times daily. Patient not taking: Reported on 09/07/2020 12/23/19   [name], MD  sildenafil (REVATIO) 20 MG tablet Take 2 tablets (40 mg total) by mouth 3 (three) times daily. Patient not taking: Reported on 09/07/2020 10/21/19   [name], PA-C     Vital Signs: BP (!) 105/58 (BP Location: Left Leg)   Pulse (!) 59   Temp 97.7 F (36.5 C) (Oral)   Resp 18   Ht 5\' 7"  (1.702 m)   Wt 68.7 kg   SpO2 100%   BMI 23.72 kg/m   PE: awake/alert; GB drain intact, insertion site ok, not sig tender,  Bag full of thin bilious output  Imaging: DG CHEST PORT 1 VIEW  Result Date: 09/18/2020 CLINICAL DATA:  Congestive heart failure, fever. EXAM: PORTABLE CHEST 1 VIEW COMPARISON:  September 14, 2020 FINDINGS: Stable cardiomediastinal silhouette. Stable postsurgical changes from prior CABG. Low lung volumes.  Chronic coarsening of the interstitium. Osseous structures are without acute abnormality. Soft tissues are grossly normal. IMPRESSION: 1. Low lung volumes with chronic coarsening of the interstitium. 2. No definite focal airspace consolidation. Electronically Signed   By: [name] M.D.   On: 09/18/2020 09:34   ECHOCARDIOGRAM LIMITED  Result Date: 09/15/2020    ECHOCARDIOGRAM LIMITED REPORT   Patient Name:  Vincent Robinson Date of Exam: 09/15/2020 Medical Rec #:  [id number]      Height:       67.0 in Accession #:    [id number]     Weight:       159.8 lb Date of Birth:  08-07-1923       BSA:          1.838 m Patient Age:    86 years       BP:           100/38 mmHg Patient Gender: M              HR:           53 bpm. Exam Location:  Inpatient Procedure: Limited Color Doppler, Cardiac Doppler, Limited Echo and Intracardiac            Opacification Agent Indications:    Hypotension [[id number]]  History:         Patient has prior history of Echocardiogram examinations, most                 recent 09/08/2020. CHF, Pulmonary HTN and COPD, Aortic Valve                 Disease; Risk Factors:Sleep Apnea, Former Smoker and                 Dyslipidemia. AAA s/p Thoracic Stent. Asthma. Hypothyroid.                 Chronic Hypoxic Respiratory Failure.  Sonographer:    [name] RDCS Referring Phys: [id number] [place]  1. Left ventricular ejection fraction, by estimation, is 60 to 65%. The left ventricle has normal function. There is mild left ventricular hypertrophy. Left ventricular diastolic parameters are consistent with Grade II diastolic dysfunction (pseudonormalization). Elevated left atrial pressure.  2. Right ventricular systolic function is moderately reduced. The right ventricular size is moderately enlarged. There is severely elevated pulmonary artery systolic pressure. The estimated right ventricular systolic pressure is 84.1 mmHg.  3. Left atrial size was mildly dilated.  4. Right atrial size was moderately dilated.  5. The mitral valve is degenerative. Mild mitral valve regurgitation.  6. Tricuspid valve regurgitation is severe.  7. The aortic valve is calcified. There is severe calcifcation of the aortic valve. There is severe thickening of the aortic valve. Aortic valve regurgitation is mild.  8. The inferior vena cava is dilated in size with <50% respiratory variability, suggesting right atrial pressure of 15 mmHg. Comparison(s): No significant change from prior study. Prior images reviewed side by side. FINDINGS  Left Ventricle: Left ventricular ejection fraction, by estimation, is 60 to 65%. The left ventricle has normal function. Definity contrast agent was given IV to delineate the left ventricular endocardial borders. There is mild left ventricular hypertrophy. Left ventricular diastolic parameters are consistent with Grade II diastolic dysfunction (pseudonormalization). Elevated left  atrial pressure. Right Ventricle: The right ventricular size is moderately enlarged. Right ventricular systolic function is moderately reduced. There is severely elevated pulmonary artery systolic pressure. The tricuspid regurgitant velocity is 4.21 m/s, and with an assumed right atrial pressure of 15 mmHg, the estimated right ventricular systolic pressure is 32.4 mmHg. Left Atrium: Left atrial size was mildly dilated. Right Atrium: Right atrial size was moderately dilated. Mitral Valve: The mitral valve is degenerative in appearance. There is mild thickening of the mitral valve leaflet(s). There is mild calcification of the mitral valve leaflet(s). Mild mitral  annular calcification. Mild mitral valve regurgitation. Tricuspid Valve: The tricuspid valve is not well visualized. Tricuspid valve regurgitation is severe. Aortic Valve: The aortic valve is calcified. There is severe calcifcation of the aortic valve. There is severe thickening of the aortic valve. Aortic valve regurgitation is mild. Pulmonic Valve: The pulmonic valve was not well visualized. Pulmonic valve regurgitation is mild. Venous: The inferior vena cava is dilated in size with less than 50% respiratory variability, suggesting right atrial pressure of 15 mmHg. LEFT VENTRICLE PLAX 2D LVIDd:         4.70 cm     Diastology LVIDs:         3.70 cm     LV e' medial:    5.22 cm/s LV PW:         1.30 cm     LV E/e' medial:  17.4 LV IVS:        1.30 cm     LV e' lateral:   10.10 cm/s                            LV E/e' lateral: 9.0  LV Volumes (MOD) LV vol d, MOD A2C: 69.8 ml LV vol d, MOD A4C: 79.2 ml LV vol s, MOD A2C: 19.2 ml LV vol s, MOD A4C: 28.7 ml LV SV MOD A2C:     50.6 ml LV SV MOD A4C:     79.2 ml LV SV MOD BP:      50.8 ml RIGHT VENTRICLE TAPSE (M-mode): 1.4 cm LEFT ATRIUM         Index LA diam:    4.40 cm 2.39 cm/m  AORTIC VALVE LVOT Vmax:   75.10 cm/s LVOT Vmean:  55.800 cm/s LVOT VTI:    0.165 m MITRAL VALVE               TRICUSPID VALVE MV Area  (PHT): 3.63 cm    TR Peak grad:   70.9 mmHg MV Decel Time: 209 msec    TR Vmax:        421.00 cm/s MV E velocity: 90.80 cm/s MV A velocity: 61.30 cm/s  SHUNTS MV E/A ratio:  1.48        Systemic VTI: 0.16 m Candee Furbish MD Electronically signed by Candee Furbish MD Signature Date/Time: 09/15/2020/1:27:33 PM    Final     Labs:  CBC: Recent Labs    09/10/20 0500 09/12/20 0653 09/14/20 0515 09/17/20 0351  WBC 6.7 7.0 7.0 8.5  HGB 11.0* 12.0* 11.2* 12.2*  HCT 34.4* 36.5* 33.6* 37.7*  PLT 163 204 249 329    COAGS: Recent Labs    03/22/20 2008 09/07/20 2355 09/08/20 0740  INR 1.2 1.1 1.3*  APTT  --  32  --     BMP: Recent Labs    03/22/20 0042 03/22/20 0042 03/22/20 0426 03/22/20 0426 03/23/20 0542 03/23/20 0542 03/25/20 0514 09/07/20 2320 09/16/20 0350 09/16/20 1337 09/17/20 0351 09/18/20 0124  NA 140   < > 139   < > 138   < > 136   < > 133* 134* 133* 134*  K 3.9   < > 3.9   < > 4.0   < > 4.2   < > 2.8* 3.2* 4.4 3.8  CL 102   < > 105   < > 104   < > 105   < > 96* 100 97* 97*  CO2 28   < >  25   < > 23   < > 24   < > 26 23 26 24   GLUCOSE 112*   < > 107*   < > 103*   < > 96   < > 114* 114* 98 94  BUN 23   < > 23   < > 28*   < > 25*   < > 13 13 13 14   CALCIUM 8.6*   < > 8.0*   < > 8.1*   < > 8.0*   < > 7.6* 7.6* 8.1* 8.3*  CREATININE 1.39*   < > 1.24   < > 0.99   < > 0.97   < > 1.08 0.95 1.27* 1.09  GFRNONAA 43*   < > 49*   < > >60   < > >60   < > >60 >60 52* >60  GFRAA 50*  --  57*  --  >60  --  >60  --   --   --   --   --    < > = values in this interval not displayed.    LIVER FUNCTION TESTS: Recent Labs    09/08/20 0740 09/09/20 0500 09/10/20 0500 09/12/20 0653  BILITOT 3.7* 4.2* 3.0* 2.4*  AST 439* 147* 64* 25  ALT 278* 167* 108* 64*  ALKPHOS 184* 113 82 73  PROT 6.9 6.2* 5.7* 6.9  ALBUMIN 3.2* 2.7* 2.4* 2.8*    Assessment and Plan: Pt with hx sepsis/acute cholecystitis; s/p perc GB drain 11/18;   pt to keep drain at least 4-6 weeks unless GB removed  surgically in interim; f/u cholangiogram in about 6 weeks or sooner if any problems arise. Discharge instructions and follow up plan/orders placed.   Electronically Signed: Ascencion Dike, PA-C 09/19/2020, 12:14 PM   I spent a total of 15 minutes at the the patient's bedside AND on the patient's hospital floor or unit, greater than 50% of which was counseling/coordinating care for gallbladder drain

## 2020-09-20 ENCOUNTER — Other Ambulatory Visit: Payer: Self-pay | Admitting: Surgery

## 2020-09-20 ENCOUNTER — Telehealth: Payer: Self-pay | Admitting: Critical Care Medicine

## 2020-09-20 DIAGNOSIS — K810 Acute cholecystitis: Secondary | ICD-10-CM

## 2020-09-20 NOTE — Telephone Encounter (Signed)
Called spoke with pharmacy and let them know to cancel the order, per Dr. Carlis Abbott and the case manager at the hospital Tomi Bamberger RN .   Nothing further needed at this time.

## 2020-09-22 ENCOUNTER — Other Ambulatory Visit (HOSPITAL_COMMUNITY): Payer: Self-pay | Admitting: Surgery

## 2020-09-22 MED FILL — NORMAL SALINE FLUSH SYRINGE: 0.9 | 30 days supply | Qty: 600 | Fill #0

## 2020-09-29 ENCOUNTER — Ambulatory Visit (HOSPITAL_COMMUNITY)
Admit: 2020-09-29 | Discharge: 2020-09-29 | Disposition: A | Discharge status: Home / Self Care | Payer: Medicare Other | Source: Ambulatory Visit | Attending: Cardiology | Admitting: Cardiology

## 2020-09-29 ENCOUNTER — Other Ambulatory Visit: Payer: Self-pay

## 2020-09-29 VITALS — BP 107/76 | HR 81 | Wt 154.6 lb

## 2020-09-29 DIAGNOSIS — K819 Cholecystitis, unspecified: Secondary | ICD-10-CM | POA: Insufficient documentation

## 2020-09-29 DIAGNOSIS — Z951 Presence of aortocoronary bypass graft: Secondary | ICD-10-CM | POA: Diagnosis not present

## 2020-09-29 DIAGNOSIS — Z79899 Other long term (current) drug therapy: Secondary | ICD-10-CM | POA: Insufficient documentation

## 2020-09-29 DIAGNOSIS — I714 Abdominal aortic aneurysm, without rupture: Secondary | ICD-10-CM | POA: Insufficient documentation

## 2020-09-29 DIAGNOSIS — R509 Fever, unspecified: Secondary | ICD-10-CM | POA: Insufficient documentation

## 2020-09-29 DIAGNOSIS — Z85828 Personal history of other malignant neoplasm of skin: Secondary | ICD-10-CM | POA: Diagnosis not present

## 2020-09-29 DIAGNOSIS — Z87891 Personal history of nicotine dependence: Secondary | ICD-10-CM | POA: Insufficient documentation

## 2020-09-29 DIAGNOSIS — I50813 Acute on chronic right heart failure: Secondary | ICD-10-CM | POA: Diagnosis not present

## 2020-09-29 DIAGNOSIS — J849 Interstitial pulmonary disease, unspecified: Secondary | ICD-10-CM | POA: Insufficient documentation

## 2020-09-29 DIAGNOSIS — I712 Thoracic aortic aneurysm, without rupture: Secondary | ICD-10-CM | POA: Insufficient documentation

## 2020-09-29 DIAGNOSIS — Z79890 Hormone replacement therapy (postmenopausal): Secondary | ICD-10-CM | POA: Diagnosis not present

## 2020-09-29 DIAGNOSIS — Z7982 Long term (current) use of aspirin: Secondary | ICD-10-CM | POA: Insufficient documentation

## 2020-09-29 DIAGNOSIS — I350 Nonrheumatic aortic (valve) stenosis: Secondary | ICD-10-CM | POA: Insufficient documentation

## 2020-09-29 DIAGNOSIS — J449 Chronic obstructive pulmonary disease, unspecified: Secondary | ICD-10-CM | POA: Diagnosis not present

## 2020-09-29 DIAGNOSIS — J9611 Chronic respiratory failure with hypoxia: Secondary | ICD-10-CM | POA: Insufficient documentation

## 2020-09-29 DIAGNOSIS — I272 Pulmonary hypertension, unspecified: Secondary | ICD-10-CM

## 2020-09-29 DIAGNOSIS — E785 Hyperlipidemia, unspecified: Secondary | ICD-10-CM | POA: Insufficient documentation

## 2020-09-29 DIAGNOSIS — I2510 Atherosclerotic heart disease of native coronary artery without angina pectoris: Secondary | ICD-10-CM | POA: Insufficient documentation

## 2020-09-29 DIAGNOSIS — I5032 Chronic diastolic (congestive) heart failure: Secondary | ICD-10-CM | POA: Diagnosis not present

## 2020-09-29 DIAGNOSIS — I480 Paroxysmal atrial fibrillation: Secondary | ICD-10-CM | POA: Diagnosis not present

## 2020-09-29 DIAGNOSIS — Z8551 Personal history of malignant neoplasm of bladder: Secondary | ICD-10-CM | POA: Insufficient documentation

## 2020-09-29 DIAGNOSIS — Z8249 Family history of ischemic heart disease and other diseases of the circulatory system: Secondary | ICD-10-CM | POA: Diagnosis not present

## 2020-09-29 DIAGNOSIS — I5082 Biventricular heart failure: Secondary | ICD-10-CM | POA: Insufficient documentation

## 2020-09-29 DIAGNOSIS — Z9981 Dependence on supplemental oxygen: Secondary | ICD-10-CM | POA: Diagnosis not present

## 2020-09-29 LAB — CBC
HCT: 38.7 % — ABNORMAL LOW (ref 39.0–52.0)
Hemoglobin: 13.2 g/dL (ref 13.0–17.0)
MCH: 32.3 pg (ref 26.0–34.0)
MCHC: 34.1 g/dL (ref 30.0–36.0)
MCV: 94.6 fL (ref 80.0–100.0)
Platelets: 381 10*3/uL (ref 150–400)
RBC: 4.09 MIL/uL — ABNORMAL LOW (ref 4.22–5.81)
RDW: 14.6 % (ref 11.5–15.5)
WBC: 10.5 10*3/uL (ref 4.0–10.5)
nRBC: 0 % (ref 0.0–0.2)

## 2020-09-29 LAB — BASIC METABOLIC PANEL
Anion gap: 12 (ref 5–15)
BUN: 24 mg/dL — ABNORMAL HIGH (ref 8–23)
CO2: 25 mmol/L (ref 22–32)
Calcium: 9 mg/dL (ref 8.9–10.3)
Chloride: 101 mmol/L (ref 98–111)
Creatinine, Ser: 1.06 mg/dL (ref 0.61–1.24)
GFR, Estimated: >60 mL/min (ref >60)
Glucose, Bld: 97 mg/dL (ref 70–99)
Potassium: 3.6 mmol/L (ref 3.5–5.1)
Sodium: 138 mmol/L (ref 135–145)

## 2020-09-29 MED ORDER — FLUTICASONE-SALMETEROL(SENSOR) 113-14 MCG/ACT IN AEPB
1.0000 | INHALATION_SPRAY | Freq: Two times a day (BID) | RESPIRATORY_TRACT | 1 refills | Status: DC
Start: 2020-09-29 — End: 2021-07-08

## 2020-09-29 NOTE — Patient Instructions (Signed)
It was great to see you today! No medication changes are needed at this time.  Labs today We will only contact you if something comes back abnormal or we need to make some changes. Otherwise no news is good news!  Your physician recommends that you schedule a follow-up appointment in: 2-3 months with Dr Haroldine Laws  If you have any questions or concerns before your next appointment please send [place] a message through [place] or call our office at [phone].    TO LEAVE A MESSAGE FOR THE NURSE SELECT OPTION 2, PLEASE LEAVE A MESSAGE INCLUDING: . YOUR NAME . DATE OF BIRTH . CALL BACK NUMBER . REASON FOR CALL**this is important as we prioritize the call backs  YOU WILL RECEIVE A CALL BACK THE SAME DAY AS LONG AS YOU CALL BEFORE 4:00 PM

## 2020-09-29 NOTE — Progress Notes (Signed)
Advanced Heart Failure Clinic Note   Referring Physician: PCP: Katherina Mires, MD PCP-Cardiologist: Quay Burow, MD  [facility]: Dr. Haroldine Laws   HPI:  Mr. Amadon is a 86 year old male with mild ILD, diastolic HF, moderate AS, PAF, PAH on pulmonary vasodilators and chronic hypoxic respiratory failure on 2L O2.  He was admitted on 11/17 with septic shock requiring vasopressors and his pulmonary vasodilators were held. Found to have acute cholecystitis and underwent perc tube by IR on 11/18 with good response.  1/2Blood Cx 11/17 with staph hominis and staph lugdunesis thought to be contaminate.  Gallbladder cultures grew enterococcus and enterobacter. Treated w/ Zosyn x 14 days course (completed therapy at home through PICC line, now removed).   During hospitalization, He was able to be weaned off vasopressors and slowly had his sildenafil and macitentan added back. Has been on NE to support his BP. Coox remained reassuring (76%) but echo confirmed ongoing acute on chronic right heart failure. He was diuresed and placed back on torsemide. Midodrine was titrated to 15 mg tid to help w/ NE wean.   Also of note, he was noted on imaging to have an evolving bladder lesion, concerning for urothelia cancer. Recommendations were to f/u w/ outpatient urology.   Here today w/ daughter. Doing well from cardiac/pulmonary standpoint. Continues on home O2 at 4L/min (baseline). No increased O2 requirements. Denies dyspnea. No CP. BP stable on midodrine. No orthostatic symptoms. BP today 107/76. EKG shows NSR w/ PACs. No edema on exam. Wt stable since d/c.   He has completed abx. Continues w/ bilary drain. Has f/u w/ IR next week. Denies pain near drain but has noticed increased pressure around site this am. [facility] RN did dressing change yesterday and his daughter reports that RN stated that site looked ok. He denies chills. Had subjective fever yesterday.   He has f/u w/ urology next month.   Cardiac Studies   Echo 09/15/20: EF 60% RV moderately dilated with mod-severe RV dysfunction. Sever TR. RVSP ~38mmHG. AoV severely calcified with reduced motion. No gradient measured.   Review of systems complete and found to be negative unless listed in HPI.      Past Medical History:  Diagnosis Date  . AAA (abdominal aortic aneurysm) ([place])   . Acute respiratory failure ([place]) 11/23/2019  . Asthma   . Basal cell carcinoma   . Chronic respiratory failure ([place])   . COPD (chronic obstructive pulmonary disease) ([place])   . Coronary artery disease   . History of chronic respiratory failure   . Hyperlipidemia   . Hypothyroidism   . Right heart failure ([place])   . Skin cancer 2017  . Sleep apnea     Current Outpatient Medications  Medication Sig Dispense Refill  . albuterol (PROVENTIL HFA;VENTOLIN HFA) 108 (90 Base) MCG/ACT inhaler Inhale 1 puff into the lungs 2 (two) times daily. (Patient taking differently: Inhale 1 puff into the lungs every 4 (four) hours as needed.) 1 Inhaler 2  . fluticasone (VERAMYST) 27.5 MCG/SPRAY nasal spray Place 1 spray into the nose daily as needed for rhinitis.     . Fluticasone-Salmeterol,sensor, 113-14 MCG/ACT AEPB Inhale 1 puff into the lungs 2 (two) times daily.    Marland Kitchen levothyroxine (SYNTHROID) 100 MCG tablet Take 1 tablet (100 mcg total) by mouth daily at 6 (six) AM. 30 tablet 1  . loratadine (CLARITIN) 10 MG tablet Take 10 mg by mouth daily as needed for allergies.     . macitentan (OPSUMIT) 10 MG  tablet Take 1 tablet (10 mg total) by mouth daily. Needs appt for future refills 30 tablet 3  . midodrine (PROAMATINE) 10 MG tablet Take 1.5 tablets (15 mg total) by mouth 3 (three) times daily after meals. 135 tablet 1  . montelukast (SINGULAIR) 10 MG tablet Take 10 mg by mouth at bedtime.    . pantoprazole (PROTONIX) 20 MG tablet Take 20 mg by mouth 2 (two) times daily.     . potassium chloride 20 MEQ/15ML (10%) SOLN Take 15 mLs (20 mEq total) by mouth 2 (two) times daily. 900 mL  1  . sildenafil (REVATIO) 20 MG tablet Take 2 tablets (40 mg total) by mouth 3 (three) times daily. 540 tablet 3  . simvastatin (ZOCOR) 10 MG tablet Take 10 mg by mouth at bedtime.    . torsemide (DEMADEX) 20 MG tablet Take 2 tablets (40 mg total) by mouth daily. 60 tablet 6   No current facility-administered medications for this encounter.    No Known Allergies    Social History   Socioeconomic History  . Marital status: Married    Spouse name: Not on file  . Number of children: Not on file  . Years of education: Not on file  . Highest education level: Not on file  Occupational History  . Not on file  Tobacco Use  . Smoking status: Former Smoker    Packs/day: 0.50    Quit date: 08/06/1961    Years since quitting: 59.1  . Smokeless tobacco: Never Used  Vaping Use  . Vaping Use: Never used  Substance and Sexual Activity  . Alcohol use: Not Currently  . Drug use: Never  . Sexual activity: Not on file  Other Topics Concern  . Not on file  Social History Narrative  . Not on file   Social Determinants of Health   Financial Resource Strain: Not on file  Food Insecurity: Not on file  Transportation Needs: Not on file  Physical Activity: Not on file  Stress: Not on file  Social Connections: Not on file  Intimate Partner Violence: Not on file      Family History  Problem Relation Age of Onset  . Emphysema Mother   . Stroke Father   . Movement disorder Sister   . Aneurysm Brother        Thoracic aorta  . Macular degeneration Brother     Vitals:   09/29/20 1017  BP: 107/76  Pulse: 81  SpO2: 95%  Weight: 70.1 kg     PHYSICAL EXAM: General:  Well appearing elderly WM in wheel chair. No respiratory difficulty HEENT: normal Neck: supple. no JVD. Carotids 2+ bilat; no bruits. No lymphadenopathy or thyromegaly appreciated. Cor: PMI nondisplaced. Regular rate & rhythm. No rubs, gallops or murmurs. Lungs: clear Abdomen: soft, nontender, nondistended. No  hepatosplenomegaly. No bruits or masses. Good bowel sounds. + IJ drain RQ  Extremities: no cyanosis, clubbing, rash, edema Neuro: alert & oriented x 3, cranial nerves grossly intact. moves all 4 extremities w/o difficulty. Affect pleasant.  ECG: NSR w/ PACs    ASSESSMENT & PLAN:  1.Severe Pulmonary Hypertension with Cor Pulmonale/ Chronic Diastolic CHF -Suspect mainly WHO Group I based on w/u - recent echo w/ RVSP 86 mmHG, RV moderately reduced  - respiratory status stable. Continues on chronic home O2 (4L/min baseline). No increased dyspnea/ increased O2 requirements  - continue macitentan 10 mg daily  and sildenafil 40 tid.  - Continue midodrine 15 mg tid for BP support. -  Continue torsemide 40 mg daily. Wt stable since discharge - Continue home O2   2. Cholecystitis - recent admit 11/21 for acute cholecystitis  - s/p percutaneous C-tube (remains in place), has f/u w/ IR next week  - I has completed 2 week course of abx therapy w/  Zosyn.  - denies pain but reports subjective fever yesterday  - check CBC today   3. Chronic hypoxic respiratory failure on home O2 - multifactorial: PAH/diastolic HF/Interstitial Lung Disease/COPD/Asthma - respiratory status stable since d/c  - continue home O2   4.CAD s/p Remote CABG in 1998 - denies CP  - Resumed home ASA/statin  5.ParoxysmalAtrial Fibrillation - in NSR on EKG today, rate controlled  - Not on chronic anticoagulation due to history of severe diverticular bleeds requiring transfusions.  6.Moderate Aortic Stenosis -Echo 11/18/21MG  20 mmHg, AVA 1.45 cm^2, DI 0.29  7.Thoracic Aortic Aneurysm - CTA in 10/2019 showed 4.5cm ascending thoracic aortic aneurysm. - Semi-annual imaging recommended  - BP well controlled   8.AAA - History endoluminal stent graft in 07/2008 (in [place]). - Most recent ultrsound in 09/2017 showed patent aorta and iliac stent with abnormal dilatation of the mid and distal abdominal aorta  (largest measurement 2.9 cm). - recent CT of abdomen 11/21 showed AAA diameter 4.5 cm  - has f/u w/ his primary cardiologist Dr. Gwenlyn Found 1/21, referral to VS per Dr. Gwenlyn Found  - BP is well controlled.   9. Bladder Mass - h/o bladder cancer - followed by urology  - has f/u next month   F/u in 6-8 weeks      Lyda Jester, PA-C 09/29/20

## 2020-09-30 ENCOUNTER — Telehealth (HOSPITAL_COMMUNITY): Payer: Self-pay | Admitting: Pharmacy Technician

## 2020-09-30 NOTE — Telephone Encounter (Signed)
A representative from [employer] called in wanting an updated phone number for the patient. They are trying to ensure that the patient received his re-enrollment application for [employer] assistance. I provided all the numbers we have on file.   They are supposed to fax [place] the provider's portion as well.   Charlann Boxer, CPhT

## 2020-10-06 ENCOUNTER — Other Ambulatory Visit: Payer: Self-pay | Admitting: Surgery

## 2020-10-06 ENCOUNTER — Telehealth (HOSPITAL_COMMUNITY): Payer: Self-pay | Admitting: Pharmacy Technician

## 2020-10-06 ENCOUNTER — Ambulatory Visit
Admission: RE | Admit: 2020-10-06 | Discharge: 2020-10-06 | Disposition: A | Discharge status: Home / Self Care | Payer: Medicare Other | Source: Ambulatory Visit | Attending: Surgery | Admitting: Surgery

## 2020-10-06 ENCOUNTER — Ambulatory Visit
Admission: RE | Admit: 2020-10-06 | Discharge: 2020-10-06 | Disposition: A | Discharge status: Home / Self Care | Payer: Medicare Other | Source: Ambulatory Visit | Attending: Radiology | Admitting: Radiology

## 2020-10-06 ENCOUNTER — Encounter: Payer: Self-pay | Admitting: Radiology

## 2020-10-06 DIAGNOSIS — K810 Acute cholecystitis: Secondary | ICD-10-CM

## 2020-10-06 HISTORY — PX: IR RADIOLOGIST EVAL & MGMT: IMG5224

## 2020-10-06 NOTE — Progress Notes (Signed)
Referring Physician(s): Dr. Donne Hazel  Chief Complaint: The patient is seen in follow up today s/p acute cholecystitis  History of present illness: Vincent Robinson a 86year old male with mild ILD, diastolic HF, moderate AS, PAF,PAH on pulmonary vasodilatorsandchronic hypoxic respiratory failure on 4LO2 at home.  He was admitted on 11/17with septic shock due to acute cholecystitis.  Due to his above listed cardiopulmonary disease, he was not felt to be a candidate for surgery and underwent percutaneous cholecystostomy tube by Dr. Vernard Gambles on 11/18.  Gallbladder cultures grew enterococcus and enterobacter. He was treated w/ Zosyn x14 days at home and has completed this.  He now presents to IR drain clinic for evaluation and management of his percutaneous cholecystostomy.   Vincent Robinson presents to IR today in his usual state of health.  He reports ongoing poor appetite, but does feel this is steadily improving and he has been doing well at home. Denies fever, chills, nausea, vomiting, abdominal pain. He has completed his antibiotics at home.  His daughter assists with drain care and has been flushing 1-2 times per day.  He is having significant output at 200+ mL/day. No complaints related to drain.   Past Medical History:  Diagnosis Date  . AAA (abdominal aortic aneurysm) ([place])   . Acute respiratory failure ([place]) 11/23/2019  . Asthma   . Basal cell carcinoma   . Chronic respiratory failure ([place])   . COPD (chronic obstructive pulmonary disease) ([place])   . Coronary artery disease   . History of chronic respiratory failure   . Hyperlipidemia   . Hypothyroidism   . Right heart failure ([place])   . Skin cancer 2017  . Sleep apnea     Past Surgical History:  Procedure Laterality Date  . bleeding ulcer  2016  . BYPASS AXILLA/BRACHIAL ARTERY  1998  . colon bleed  2012  . COLONOSCOPY N/A 06/07/2017   Procedure: COLONOSCOPY;  Surgeon: Carol Ada, MD;  Location: [facility];  Service:  Endoscopy;  Laterality: N/A;  . DEBRIDMENT OF DECUBITUS ULCER  2002  . IR ANGIOGRAM SELECTIVE EACH ADDITIONAL VESSEL  03/22/2020  . IR ANGIOGRAM VISCERAL SELECTIVE  03/22/2020  . IR PERC CHOLECYSTOSTOMY  09/08/2020  . IR RADIOLOGIST EVAL & MGMT  10/06/2020  . IR US GUIDE VASC ACCESS RIGHT  03/22/2020  . RIGHT HEART CATH N/A 12/11/2017   Procedure: RIGHT HEART CATH;  Surgeon: Jolaine Artist, MD;  Location: [facility];  Service: Cardiovascular;  Laterality: N/A;  . THORACIC AORTA STENT  2009    Allergies: Patient has no known allergies.  Medications: Prior to Admission medications   Medication Sig Start Date End Date Taking? Authorizing Provider  albuterol (PROVENTIL HFA;VENTOLIN HFA) 108 (90 Base) MCG/ACT inhaler Inhale 1 puff into the lungs 2 (two) times daily. Patient taking differently: Inhale 1 puff into the lungs every 4 (four) hours as needed. 05/20/17   Mannam, Hart Robinsons, MD  fluticasone (VERAMYST) 27.5 MCG/SPRAY nasal spray Place 1 spray into the nose daily as needed for rhinitis.     [provider]  Fluticasone-Salmeterol,sensor, 113-14 MCG/ACT AEPB Inhale 1 puff into the lungs 2 (two) times daily. 09/29/20   [name], PA-C  levothyroxine (SYNTHROID) 100 MCG tablet Take 1 tablet (100 mcg total) by mouth daily at 6 (six) AM. 09/20/20   Carlis Abbott, Venita Sheffield, DO  loratadine (CLARITIN) 10 MG tablet Take 10 mg by mouth daily as needed for allergies.     [provider]  macitentan (OPSUMIT) 10  MG tablet Take 1 tablet (10 mg total) by mouth daily. Needs appt for future refills 08/19/20   Bensimhon, Shaune Pascal, MD  midodrine (PROAMATINE) 10 MG tablet Take 1.5 tablets (15 mg total) by mouth 3 (three) times daily after meals. 09/19/20   Noemi Chapel P, DO  montelukast (SINGULAIR) 10 MG tablet Take 10 mg by mouth at bedtime.    [provider]  pantoprazole (PROTONIX) 20 MG tablet Take 20 mg by mouth 2 (two) times daily.     [provider]   potassium chloride 20 MEQ/15ML (10%) SOLN Take 15 mLs (20 mEq total) by mouth 2 (two) times daily. 12/23/19   [name], MD  sildenafil (REVATIO) 20 MG tablet Take 2 tablets (40 mg total) by mouth 3 (three) times daily. 10/21/19   Lyda Jester M, PA-C  simvastatin (ZOCOR) 10 MG tablet Take 10 mg by mouth at bedtime.    [provider]  torsemide (DEMADEX) 20 MG tablet Take 2 tablets (40 mg total) by mouth daily. 03/03/20   Bensimhon, Shaune Pascal, MD     Family History  Problem Relation Age of Onset  . Emphysema Mother   . Stroke Father   . Movement disorder Sister   . Aneurysm Brother        Thoracic aorta  . Macular degeneration Brother     Social History   Socioeconomic History  . Marital status: Married    Spouse name: Not on file  . Number of children: Not on file  . Years of education: Not on file  . Highest education level: Not on file  Occupational History  . Not on file  Tobacco Use  . Smoking status: Former Smoker    Packs/day: 0.50    Quit date: 08/06/1961    Years since quitting: 59.2  . Smokeless tobacco: Never Used  Vaping Use  . Vaping Use: Never used  Substance and Sexual Activity  . Alcohol use: Not Currently  . Drug use: Never  . Sexual activity: Not on file  Other Topics Concern  . Not on file  Social History Narrative  . Not on file   Social Determinants of Health   Financial Resource Strain: Not on file  Food Insecurity: Not on file  Transportation Needs: Not on file  Physical Activity: Not on file  Stress: Not on file  Social Connections: Not on file     Vital Signs: There were no vitals taken for this visit.  Physical Exam  NAD, alert Abdomen: soft, non-tender.  RUQ drain in place.  Insertion site I/c/d.  Yellow-brown bilious output in gravity bag.   Imaging: IR Radiologist Eval & Mgmt  Result Date: 10/06/2020 Please refer to notes tab for details about interventional procedure. (Op  Note)   Labs:  CBC: Recent Labs    09/12/20 0653 09/14/20 0515 09/17/20 0351 09/29/20 1042  WBC 7.0 7.0 8.5 10.5  HGB 12.0* 11.2* 12.2* 13.2  HCT 36.5* 33.6* 37.7* 38.7*  PLT 204 249 329 381    COAGS: Recent Labs    03/22/20 2008 09/07/20 2355 09/08/20 0740  INR 1.2 1.1 1.3*  APTT  --  32  --     BMP: Recent Labs    03/22/20 0042 03/22/20 0426 03/23/20 0542 03/25/20 0514 09/07/20 2320 09/16/20 1337 09/17/20 0351 09/18/20 0124 09/29/20 1042  NA 140 139 138 136   < > 134* 133* 134* 138  K 3.9 3.9 4.0 4.2   < > 3.2* 4.4 3.8  3.6  CL 102 105 104 105   < > 100 97* 97* 101  CO2 28 25 23 24    < > 23 26 24 25   GLUCOSE 112* 107* 103* 96   < > 114* 98 94 97  BUN 23 23 28* 25*   < > 13 13 14  24*  CALCIUM 8.6* 8.0* 8.1* 8.0*   < > 7.6* 8.1* 8.3* 9.0  CREATININE 1.39* 1.24 0.99 0.97   < > 0.95 1.27* 1.09 1.06  GFRNONAA 43* 49* >60 >60   < > >60 52* >60 >60  GFRAA 50* 57* >60 >60  --   --   --   --   --    < > = values in this interval not displayed.    LIVER FUNCTION TESTS: Recent Labs    09/08/20 0740 09/09/20 0500 09/10/20 0500 09/12/20 0653  BILITOT 3.7* 4.2* 3.0* 2.4*  AST 439* 147* 64* 25  ALT 278* 167* 108* 64*  ALKPHOS 184* 113 82 73  PROT 6.9 6.2* 5.7* 6.9  ALBUMIN 3.2* 2.7* 2.4* 2.8*    Assessment: Acute cholecystitis s/p percutaneous cholecystostomy tube placement 09/08/20 Vincent Robinson presents today for follow-up of his perc cholecystostomy tube.  He has been doing well in recovery at home.  He has completed antibiotic therapy with no fever, chills, abdominal pain.  He has been able to advance his diet with no nausea or vomiting.  He does endorse a small appetite compared to his normal, but overall feels this is improving as well.  He understands that he is not a surgical candidate for cholecystectomy.  Drain injection performed by Dr. Pascal Lux and demonstrates an open biliary ductal system.  After discussion with patient and his daughter, plan made  for capping trial at home.  Patient educated on symptoms of cholecystitis and given a gravity drainage bag to be used PRN at home. Drain was capped during visit today.  No flushes needed at this time.  Patient and daughter verbalize understanding.  Follow-up visit made for 10/27/19 to reassess for possible drain removal based on tolerance of capping trial.   Signed: Docia Barrier, PA 10/06/2020, 1:49 PM   Please refer to Dr. Pascal Lux attestation of this note for management and plan.

## 2020-10-06 NOTE — Telephone Encounter (Signed)
Spoke with [name], she is going to drop off the patient's portion of the [employer] re-enrollment application.  Will follow up.

## 2020-10-26 ENCOUNTER — Encounter: Payer: Self-pay | Admitting: Radiology

## 2020-10-26 ENCOUNTER — Ambulatory Visit
Admission: RE | Admit: 2020-10-26 | Discharge: 2020-10-26 | Disposition: A | Discharge status: Home / Self Care | Payer: Medicare Other | Source: Ambulatory Visit | Attending: Surgery | Admitting: Surgery

## 2020-10-26 DIAGNOSIS — K810 Acute cholecystitis: Secondary | ICD-10-CM

## 2020-10-26 HISTORY — PX: IR RADIOLOGIST EVAL & MGMT: IMG5224

## 2020-10-26 NOTE — Progress Notes (Signed)
Referring Physician(s): Connor,Chelsea A  Chief Complaint: The patient is seen in follow up today s/p percutaneous cholecystostomy drain placed 09/08/20  History of present illness:  Last visit to [facility] 10/06/20 Imaging then:  1. Appropriately positioned and functioning cholecystostomy tube. No exchange performed. 2. No evidence of cholelithiasis or choledocholithiasis.  Pt had no complaints; No pain; fever; chills Denied pain; N/V Drain was capped with instructions to family member to replace to bag if needed.  Today pt is scheduled for recheck-- re injection of perc chole tube drain It is reported per family member that after leaving clinic 12/16 with capped drain-- pt developed some abd pain and nausea. Bag was replaced 12/18  with resolution of symptoms. Was seen by [facility] Dr Kae Heller 12/22. Pt was doing well. She approved re capping when family felt appropriate. They recapped drain 10/21/20. Has been capped since then  Today (10/26/20), pt has no complaints. Denies pain; fever; chills Denies N/V  When drain was uncapped for evaluation-- copious fluid drained out. Bile like fluid. Injection does reveal distended gallbladder and no flow to cystic or common bile duct is evident.  150 cc bile drained into bag immediately when replaced.   Past Medical History:  Diagnosis Date  . AAA (abdominal aortic aneurysm) ([place])   . Acute respiratory failure ([place]) 11/23/2019  . Asthma   . Basal cell carcinoma   . Chronic respiratory failure ([place])   . COPD (chronic obstructive pulmonary disease) ([place])   . Coronary artery disease   . History of chronic respiratory failure   . Hyperlipidemia   . Hypothyroidism   . Right heart failure ([place])   . Skin cancer 2017  . Sleep apnea     Past Surgical History:  Procedure Laterality Date  . bleeding ulcer  2016  . BYPASS AXILLA/BRACHIAL ARTERY  1998  . colon bleed  2012  . COLONOSCOPY N/A 06/07/2017   Procedure: COLONOSCOPY;   Surgeon: Carol Ada, MD;  Location: [facility];  Service: Endoscopy;  Laterality: N/A;  . DEBRIDMENT OF DECUBITUS ULCER  2002  . IR ANGIOGRAM SELECTIVE EACH ADDITIONAL VESSEL  03/22/2020  . IR ANGIOGRAM VISCERAL SELECTIVE  03/22/2020  . IR PERC CHOLECYSTOSTOMY  09/08/2020  . IR RADIOLOGIST EVAL & MGMT  10/06/2020  . IR US GUIDE VASC ACCESS RIGHT  03/22/2020  . RIGHT HEART CATH N/A 12/11/2017   Procedure: RIGHT HEART CATH;  Surgeon: Jolaine Artist, MD;  Location: [place] [facility];  Service: Cardiovascular;  Laterality: N/A;  . THORACIC AORTA STENT  2009    Allergies: Patient has no known allergies.  Medications: Prior to Admission medications   Medication Sig Start Date End Date Taking? Authorizing Provider  albuterol (PROVENTIL HFA;VENTOLIN HFA) 108 (90 Base) MCG/ACT inhaler Inhale 1 puff into the lungs 2 (two) times daily. Patient taking differently: Inhale 1 puff into the lungs every 4 (four) hours as needed. 05/20/17   Mannam, Hart Robinsons, MD  fluticasone (VERAMYST) 27.5 MCG/SPRAY nasal spray Place 1 spray into the nose daily as needed for rhinitis.     [provider]  Fluticasone-Salmeterol,sensor, 113-14 MCG/ACT AEPB Inhale 1 puff into the lungs 2 (two) times daily. 09/29/20   [name], PA-C  levothyroxine (SYNTHROID) 100 MCG tablet Take 1 tablet (100 mcg total) by mouth daily at 6 (six) AM. 09/20/20   Carlis Abbott, Venita Sheffield, DO  loratadine (CLARITIN) 10 MG tablet Take 10 mg by mouth daily as needed for allergies.     [provider]  macitentan (OPSUMIT) 10 MG tablet Take 1 tablet (10 mg total) by mouth daily. Needs appt for future refills 08/19/20   Bensimhon, Shaune Pascal, MD  midodrine (PROAMATINE) 10 MG tablet Take 1.5 tablets (15 mg total) by mouth 3 (three) times daily after meals. 09/19/20   Noemi Chapel P, DO  montelukast (SINGULAIR) 10 MG tablet Take 10 mg by mouth at bedtime.    [provider]  pantoprazole (PROTONIX) 20 MG tablet Take 20 mg by  mouth 2 (two) times daily.     [provider]  potassium chloride 20 MEQ/15ML (10%) SOLN Take 15 mLs (20 mEq total) by mouth 2 (two) times daily. 12/23/19   [name], MD  sildenafil (REVATIO) 20 MG tablet Take 2 tablets (40 mg total) by mouth 3 (three) times daily. 10/21/19   Lyda Jester M, PA-C  simvastatin (ZOCOR) 10 MG tablet Take 10 mg by mouth at bedtime.    [provider]  torsemide (DEMADEX) 20 MG tablet Take 2 tablets (40 mg total) by mouth daily. 03/03/20   Bensimhon, Shaune Pascal, MD     Family History  Problem Relation Age of Onset  . Emphysema Mother   . Stroke Father   . Movement disorder Sister   . Aneurysm Brother        Thoracic aorta  . Macular degeneration Brother     Social History   Socioeconomic History  . Marital status: Married    Spouse name: Not on file  . Number of children: Not on file  . Years of education: Not on file  . Highest education level: Not on file  Occupational History  . Not on file  Tobacco Use  . Smoking status: Former Smoker    Packs/day: 0.50    Quit date: 08/06/1961    Years since quitting: 59.2  . Smokeless tobacco: Never Used  Vaping Use  . Vaping Use: Never used  Substance and Sexual Activity  . Alcohol use: Not Currently  . Drug use: Never  . Sexual activity: Not on file  Other Topics Concern  . Not on file  Social History Narrative  . Not on file   Social Determinants of Health   Financial Resource Strain: Not on file  Food Insecurity: Not on file  Transportation Needs: Not on file  Physical Activity: Not on file  Stress: Not on file  Social Connections: Not on file     Vital Signs: There were no vitals taken for this visit.  Physical Exam Skin:    General: Skin is warm.     Comments: Site of drain is clean and dry NT no bleeding No sign of infection  Drain injection reveals distended gall bladder and NO flow to cystic or common bile duct per Dr Anselm Pancoast      Imaging: No  results found.  Labs:  CBC: Recent Labs    09/12/20 0653 09/14/20 0515 09/17/20 0351 09/29/20 1042  WBC 7.0 7.0 8.5 10.5  HGB 12.0* 11.2* 12.2* 13.2  HCT 36.5* 33.6* 37.7* 38.7*  PLT 204 249 329 381    COAGS: Recent Labs    03/22/20 2008 09/07/20 2355 09/08/20 0740  INR 1.2 1.1 1.3*  APTT  --  32  --     BMP: Recent Labs    03/22/20 0042 03/22/20 0426 03/23/20 0542 03/25/20 0514 09/07/20 2320 09/16/20 1337 09/17/20 0351 09/18/20 0124 09/29/20 1042  NA 140 139 138 136   < > 134* 133* 134* 138  K  3.9 3.9 4.0 4.2   < > 3.2* 4.4 3.8 3.6  CL 102 105 104 105   < > 100 97* 97* 101  CO2 28 25 23 24    < > 23 26 24 25   GLUCOSE 112* 107* 103* 96   < > 114* 98 94 97  BUN 23 23 28* 25*   < > 13 13 14  24*  CALCIUM 8.6* 8.0* 8.1* 8.0*   < > 7.6* 8.1* 8.3* 9.0  CREATININE 1.39* 1.24 0.99 0.97   < > 0.95 1.27* 1.09 1.06  GFRNONAA 43* 49* >60 >60   < > >60 52* >60 >60  GFRAA 50* 57* >60 >60  --   --   --   --   --    < > = values in this interval not displayed.    LIVER FUNCTION TESTS: Recent Labs    09/08/20 0740 09/09/20 0500 09/10/20 0500 09/12/20 0653  BILITOT 3.7* 4.2* 3.0* 2.4*  AST 439* 147* 64* 25  ALT 278* 167* 108* 64*  ALKPHOS 184* 113 82 73  PROT 6.9 6.2* 5.7* 6.9  ALBUMIN 3.2* 2.7* 2.4* 2.8*    Assessment:  Percutaneous cholecystostomy drain placed in IR 09/08/20 Drain injection 12/16 revealing good flow to biliary system-- was capped. Today for re injection and evaluation Injection today shows gallbladder distension and no flow to biliary system. Drain bag replaced with immediate collection of 150 cc bile. Plan: keep appt with Dr 09/14/20 in next few days. Continue to flush daily 5 cc sterile saline. Return to IR [facility] Clinic 4 weeks. Pts family member has good understanding of plan   Signed: 09/10/20 10/26/2020, 2:17 PM   Please refer to Dr. Fredricka Bonine attestation of this note for management and plan.

## 2020-10-27 ENCOUNTER — Other Ambulatory Visit (HOSPITAL_COMMUNITY): Payer: Self-pay | Admitting: Surgery

## 2020-10-27 ENCOUNTER — Other Ambulatory Visit (HOSPITAL_COMMUNITY): Payer: Self-pay | Admitting: Diagnostic Radiology

## 2020-10-27 DIAGNOSIS — K810 Acute cholecystitis: Secondary | ICD-10-CM

## 2020-11-01 ENCOUNTER — Telehealth (HOSPITAL_COMMUNITY): Payer: Self-pay

## 2020-11-01 NOTE — Telephone Encounter (Signed)
Called to schedule drain exchange, no answer, left vm. AW 

## 2020-11-02 ENCOUNTER — Other Ambulatory Visit (HOSPITAL_COMMUNITY): Payer: Self-pay | Admitting: Cardiology

## 2020-11-08 ENCOUNTER — Ambulatory Visit: Payer: Medicare Other | Admitting: Cardiovascular Disease

## 2020-11-13 ENCOUNTER — Inpatient Hospital Stay (HOSPITAL_COMMUNITY)
Admission: EM | Admit: 2020-11-13 | Discharge: 2020-11-18 | DRG: 871 | Disposition: A | Discharge status: Skilled Nursing Facility | Payer: Medicare Other | Attending: Internal Medicine | Admitting: Internal Medicine

## 2020-11-13 ENCOUNTER — Emergency Department (HOSPITAL_COMMUNITY): Payer: Medicare Other

## 2020-11-13 DIAGNOSIS — A4189 Other specified sepsis: Secondary | ICD-10-CM | POA: Diagnosis not present

## 2020-11-13 DIAGNOSIS — Z79890 Hormone replacement therapy (postmenopausal): Secondary | ICD-10-CM

## 2020-11-13 DIAGNOSIS — G4730 Sleep apnea, unspecified: Secondary | ICD-10-CM | POA: Diagnosis present

## 2020-11-13 DIAGNOSIS — I2510 Atherosclerotic heart disease of native coronary artery without angina pectoris: Secondary | ICD-10-CM | POA: Diagnosis present

## 2020-11-13 DIAGNOSIS — J189 Pneumonia, unspecified organism: Secondary | ICD-10-CM

## 2020-11-13 DIAGNOSIS — J440 Chronic obstructive pulmonary disease with acute lower respiratory infection: Secondary | ICD-10-CM | POA: Diagnosis present

## 2020-11-13 DIAGNOSIS — J9621 Acute and chronic respiratory failure with hypoxia: Secondary | ICD-10-CM | POA: Diagnosis present

## 2020-11-13 DIAGNOSIS — Z87891 Personal history of nicotine dependence: Secondary | ICD-10-CM

## 2020-11-13 DIAGNOSIS — Z85828 Personal history of other malignant neoplasm of skin: Secondary | ICD-10-CM

## 2020-11-13 DIAGNOSIS — E876 Hypokalemia: Secondary | ICD-10-CM | POA: Diagnosis present

## 2020-11-13 DIAGNOSIS — I2729 Other secondary pulmonary hypertension: Secondary | ICD-10-CM | POA: Diagnosis present

## 2020-11-13 DIAGNOSIS — U071 COVID-19: Secondary | ICD-10-CM | POA: Diagnosis present

## 2020-11-13 DIAGNOSIS — E871 Hypo-osmolality and hyponatremia: Secondary | ICD-10-CM | POA: Diagnosis present

## 2020-11-13 DIAGNOSIS — A419 Sepsis, unspecified organism: Secondary | ICD-10-CM

## 2020-11-13 DIAGNOSIS — J9620 Acute and chronic respiratory failure, unspecified whether with hypoxia or hypercapnia: Secondary | ICD-10-CM | POA: Diagnosis present

## 2020-11-13 DIAGNOSIS — Z66 Do not resuscitate: Secondary | ICD-10-CM | POA: Diagnosis present

## 2020-11-13 DIAGNOSIS — I5082 Biventricular heart failure: Secondary | ICD-10-CM | POA: Diagnosis present

## 2020-11-13 DIAGNOSIS — E039 Hypothyroidism, unspecified: Secondary | ICD-10-CM | POA: Diagnosis present

## 2020-11-13 DIAGNOSIS — I714 Abdominal aortic aneurysm, without rupture: Secondary | ICD-10-CM | POA: Diagnosis present

## 2020-11-13 DIAGNOSIS — J449 Chronic obstructive pulmonary disease, unspecified: Secondary | ICD-10-CM | POA: Diagnosis present

## 2020-11-13 DIAGNOSIS — R3129 Other microscopic hematuria: Secondary | ICD-10-CM | POA: Diagnosis present

## 2020-11-13 DIAGNOSIS — N3289 Other specified disorders of bladder: Secondary | ICD-10-CM

## 2020-11-13 DIAGNOSIS — R6520 Severe sepsis without septic shock: Secondary | ICD-10-CM | POA: Diagnosis present

## 2020-11-13 DIAGNOSIS — Z825 Family history of asthma and other chronic lower respiratory diseases: Secondary | ICD-10-CM

## 2020-11-13 DIAGNOSIS — I959 Hypotension, unspecified: Secondary | ICD-10-CM | POA: Diagnosis present

## 2020-11-13 DIAGNOSIS — Z79899 Other long term (current) drug therapy: Secondary | ICD-10-CM

## 2020-11-13 DIAGNOSIS — I248 Other forms of acute ischemic heart disease: Secondary | ICD-10-CM | POA: Diagnosis present

## 2020-11-13 DIAGNOSIS — H9190 Unspecified hearing loss, unspecified ear: Secondary | ICD-10-CM | POA: Diagnosis present

## 2020-11-13 DIAGNOSIS — Z9049 Acquired absence of other specified parts of digestive tract: Secondary | ICD-10-CM

## 2020-11-13 DIAGNOSIS — R778 Other specified abnormalities of plasma proteins: Secondary | ICD-10-CM

## 2020-11-13 DIAGNOSIS — E785 Hyperlipidemia, unspecified: Secondary | ICD-10-CM | POA: Diagnosis present

## 2020-11-13 DIAGNOSIS — I5032 Chronic diastolic (congestive) heart failure: Secondary | ICD-10-CM | POA: Diagnosis present

## 2020-11-13 DIAGNOSIS — Z434 Encounter for attention to other artificial openings of digestive tract: Secondary | ICD-10-CM

## 2020-11-13 DIAGNOSIS — J1282 Pneumonia due to coronavirus disease 2019: Secondary | ICD-10-CM | POA: Diagnosis present

## 2020-11-13 DIAGNOSIS — C679 Malignant neoplasm of bladder, unspecified: Secondary | ICD-10-CM | POA: Diagnosis present

## 2020-11-13 LAB — URINALYSIS, ROUTINE W REFLEX MICROSCOPIC
Bilirubin Urine: NEGATIVE
Glucose, UA: NEGATIVE mg/dL
Ketones, ur: NEGATIVE mg/dL
Leukocytes,Ua: NEGATIVE
Nitrite: NEGATIVE
Protein, ur: NEGATIVE mg/dL
RBC / HPF: >50 RBC/hpf — ABNORMAL HIGH (ref 0–5)
Specific Gravity, Urine: 1.009 (ref 1.005–1.030)
pH: 5 (ref 5.0–8.0)

## 2020-11-13 LAB — CBC WITH DIFFERENTIAL/PLATELET
Abs Immature Granulocytes: 0.06 10*3/uL (ref 0.00–0.07)
Basophils Absolute: 0 10*3/uL (ref 0.0–0.1)
Basophils Relative: 0 %
Eosinophils Absolute: 0 10*3/uL (ref 0.0–0.5)
Eosinophils Relative: 0 %
HCT: 43.3 % (ref 39.0–52.0)
Hemoglobin: 14.3 g/dL (ref 13.0–17.0)
Immature Granulocytes: 0 %
Lymphocytes Relative: 5 %
Lymphs Abs: 0.7 10*3/uL (ref 0.7–4.0)
MCH: 31.2 pg (ref 26.0–34.0)
MCHC: 33 g/dL (ref 30.0–36.0)
MCV: 94.3 fL (ref 80.0–100.0)
Monocytes Absolute: 0.7 10*3/uL (ref 0.1–1.0)
Monocytes Relative: 5 %
Neutro Abs: 13.1 10*3/uL — ABNORMAL HIGH (ref 1.7–7.7)
Neutrophils Relative %: 90 %
Platelets: 322 10*3/uL (ref 150–400)
RBC: 4.59 MIL/uL (ref 4.22–5.81)
RDW: 14.4 % (ref 11.5–15.5)
WBC: 14.6 10*3/uL — ABNORMAL HIGH (ref 4.0–10.5)
nRBC: 0 % (ref 0.0–0.2)

## 2020-11-13 LAB — COMPREHENSIVE METABOLIC PANEL
ALT: 20 U/L (ref 0–44)
AST: 29 U/L (ref 15–41)
Albumin: 3.4 g/dL — ABNORMAL LOW (ref 3.5–5.0)
Alkaline Phosphatase: 98 U/L (ref 38–126)
Anion gap: 18 — ABNORMAL HIGH (ref 5–15)
BUN: 26 mg/dL — ABNORMAL HIGH (ref 8–23)
CO2: 23 mmol/L (ref 22–32)
Calcium: 8.3 mg/dL — ABNORMAL LOW (ref 8.9–10.3)
Chloride: 91 mmol/L — ABNORMAL LOW (ref 98–111)
Creatinine, Ser: 1.16 mg/dL (ref 0.61–1.24)
GFR, Estimated: 58 mL/min — ABNORMAL LOW (ref >60)
Glucose, Bld: 95 mg/dL (ref 70–99)
Potassium: 2.8 mmol/L — ABNORMAL LOW (ref 3.5–5.1)
Sodium: 132 mmol/L — ABNORMAL LOW (ref 135–145)
Total Bilirubin: 1.3 mg/dL — ABNORMAL HIGH (ref 0.3–1.2)
Total Protein: 8.2 g/dL — ABNORMAL HIGH (ref 6.5–8.1)

## 2020-11-13 LAB — TROPONIN I (HIGH SENSITIVITY): Troponin I (High Sensitivity): 36 ng/L — ABNORMAL HIGH (ref <18)

## 2020-11-13 LAB — LACTIC ACID, PLASMA: Lactic Acid, Venous: 1.9 mmol/L (ref 0.5–1.9)

## 2020-11-13 LAB — PROTIME-INR
INR: 1.1 (ref 0.8–1.2)
Prothrombin Time: 13.4 seconds (ref 11.4–15.2)

## 2020-11-13 LAB — BRAIN NATRIURETIC PEPTIDE: B Natriuretic Peptide: 299.6 pg/mL — ABNORMAL HIGH (ref 0.0–100.0)

## 2020-11-13 MED ORDER — SODIUM CHLORIDE 0.9 % IV BOLUS
500.0000 mL | Freq: Once | INTRAVENOUS | Status: AC
  Administered 2020-11-13: 500 mL via INTRAVENOUS

## 2020-11-13 MED ORDER — POTASSIUM CHLORIDE 10 MEQ/100ML IV SOLN
10.0000 meq | INTRAVENOUS | Status: AC
  Administered 2020-11-14 (×3): 10 meq via INTRAVENOUS
  Filled 2020-11-13 (×2): qty 100

## 2020-11-13 MED ORDER — PIPERACILLIN-TAZOBACTAM 3.375 G IVPB 30 MIN
3.3750 g | Freq: Once | INTRAVENOUS | Status: AC
  Administered 2020-11-13: 3.375 g via INTRAVENOUS
  Filled 2020-11-13: qty 50

## 2020-11-13 NOTE — ED Triage Notes (Addendum)
PT called EMS for SOB; O2 sat at 70% per EMS on 5L which pt is chronically at home. Pt tachypneic with labored breathing. Hx of COPD with current cough. Pt was dx with sepsis secondary to drain placed near gallbladder. Afebrile with temporal temp 98.1. EtCO2 22 with EMS; fingertips blue with EMS.

## 2020-11-13 NOTE — ED Provider Notes (Signed)
  Provider Note MRN:  [id number]  Arrival date & time: 11/14/20    ED Course and Medical Decision Making  Assumed care from Dr. Karle Starch at shift change.  Patient felt very cold, hypoxic, soft blood pressure, recent sepsis due to cholecystitis, suspect will need admission.  On my assessment patient desaturates to 86% on his home 4 L nasal cannula, respiratory is consulted to place on high flow nasal cannula.  Patient's oral temperature is 99.5, soft blood pressure, tachycardic, similar presentation to prior sepsis, and so concern for recurrence of sepsis.  Giving IV Zosyn, small fluid bolus, will obtain CT imaging possibly with contrast depending on kidney function.  CT imaging is without evidence of PE, does not seem to have any complications related to biliary system.  There is evidence of pneumonia.  Will admit to medicine.    .Critical Care Performed by: Maudie Flakes, MD Authorized by: Maudie Flakes, MD   Critical care provider statement:    Critical care time (minutes):  32   Critical care was necessary to treat or prevent imminent or life-threatening deterioration of the following conditions:  Sepsis   Critical care was time spent personally by me on the following activities:  Discussions with consultants, evaluation of patient's response to treatment, examination of patient, ordering and performing treatments and interventions, ordering and review of laboratory studies, ordering and review of radiographic studies, pulse oximetry, re-evaluation of patient's condition, obtaining history from patient or surrogate and review of old charts   I assumed direction of critical care for this patient from another provider in my specialty: yes      Final Clinical Impressions(s) / ED Diagnoses     ICD-10-CM   1. Sepsis, due to unspecified organism, unspecified whether acute organ dysfunction present ([place])  A41.9   2. Sepsis ([place])  A41.9 DG Chest [facility] 1 View    DG Chest Port 1 View  3.  Community acquired pneumonia, unspecified laterality  J18.9     ED Discharge Orders    None      Discharge Instructions   None     Barth Kirks. Sedonia Small, [place] [email or web address]    Maudie Flakes, MD 11/14/20 [phone]

## 2020-11-13 NOTE — ED Provider Notes (Signed)
[facility] EMERGENCY DEPARTMENT Provider Note  CSN: [id number] Arrival date & time: 11/13/20 2124    History Chief Complaint  Patient presents with  . Code Sepsis    HPI  Vincent Robinson is a 87 y.o. male with multiple medical problems presents for evaluation of sudden onset of 'feeling cold'. States he was not running a fever. Has chronic SOB from COPD uses inhalers and oxygen at home. He denies any chest pain, N/V/D. EMS reported his SpO2 was 70% on their arrival and that his fingers were blue. He was placed on NRB with good improvement. He has a chronic cough, unchanged. He states he is currently feeling well. He reports this same thing happened to him a few months ago but he does not what the problem was then. Review of EMR shows an admission for sepsis in Nov 2021 due to acute cholecystitis, had a percutaneous drain, completed Abx. He also has pulmonary hypertension and HFpEF on sildenafil, midodrine, and diuretics.    Past Medical History:  Diagnosis Date  . AAA (abdominal aortic aneurysm) ([place])   . Acute respiratory failure ([place]) 11/23/2019  . Asthma   . Basal cell carcinoma   . Chronic respiratory failure ([place])   . COPD (chronic obstructive pulmonary disease) ([place])   . Coronary artery disease   . History of chronic respiratory failure   . Hyperlipidemia   . Hypothyroidism   . Right heart failure ([place])   . Skin cancer 2017  . Sleep apnea     Past Surgical History:  Procedure Laterality Date  . bleeding ulcer  2016  . BYPASS AXILLA/BRACHIAL ARTERY  1998  . colon bleed  2012  . COLONOSCOPY N/A 06/07/2017   Procedure: COLONOSCOPY;  Surgeon: Carol Ada, MD;  [facility]: [facility];  Service: Endoscopy;  Laterality: N/A;  . DEBRIDMENT OF DECUBITUS ULCER  2002  . IR ANGIOGRAM SELECTIVE EACH ADDITIONAL VESSEL  03/22/2020  . IR ANGIOGRAM VISCERAL SELECTIVE  03/22/2020  . IR PERC CHOLECYSTOSTOMY  09/08/2020  . IR RADIOLOGIST EVAL & MGMT  10/06/2020  . IR RADIOLOGIST EVAL  & MGMT  10/26/2020  . IR US GUIDE VASC ACCESS RIGHT  03/22/2020  . RIGHT HEART CATH N/A 12/11/2017   Procedure: RIGHT HEART CATH;  Surgeon: Jolaine Artist, MD;  Location: [facility] CV LAB;  Service: Cardiovascular;  Laterality: N/A;  . THORACIC AORTA STENT  2009    Family History  Problem Relation Age of Onset  . Emphysema Mother   . Stroke Father   . Movement disorder Sister   . Aneurysm Brother        Thoracic aorta  . Macular degeneration Brother     Social History   Tobacco Use  . Smoking status: Former Smoker    Packs/day: 0.50    Quit date: 08/06/1961    Years since quitting: 59.3  . Smokeless tobacco: Never Used  Vaping Use  . Vaping Use: Never used  Substance Use Topics  . Alcohol use: Not Currently  . Drug use: Never     Home Medications Prior to Admission medications   Medication Sig Start Date End Date Taking? Authorizing Provider  albuterol (PROVENTIL HFA;VENTOLIN HFA) 108 (90 Base) MCG/ACT inhaler Inhale 1 puff into the lungs 2 (two) times daily. Patient taking differently: Inhale 1 puff into the lungs every 4 (four) hours as needed. 05/20/17   Mannam, Praveen, MD  fluticasone (VERAMYST) 27.5 MCG/SPRAY nasal spray Place 1 spray into the nose daily as needed for  rhinitis.     [provider]  Fluticasone-Salmeterol,sensor, 113-14 MCG/ACT AEPB Inhale 1 puff into the lungs 2 (two) times daily. 09/29/20   [name], PA-C  levothyroxine (SYNTHROID) 100 MCG tablet Take 1 tablet (100 mcg total) by mouth daily at 6 (six) AM. 09/20/20   Carlis Abbott, Venita Sheffield, DO  loratadine (CLARITIN) 10 MG tablet Take 10 mg by mouth daily as needed for allergies.     [provider]  macitentan (OPSUMIT) 10 MG tablet Take 1 tablet (10 mg total) by mouth daily. Needs appt for future refills 08/19/20   Bensimhon, Shaune Pascal, MD  midodrine (PROAMATINE) 10 MG tablet Take 1.5 tablets (15 mg total) by mouth 3 (three) times daily after meals. 09/19/20   Noemi Chapel P, DO   montelukast (SINGULAIR) 10 MG tablet Take 10 mg by mouth at bedtime.    [provider]  pantoprazole (PROTONIX) 20 MG tablet Take 20 mg by mouth 2 (two) times daily.     [provider]  potassium chloride 20 MEQ/15ML (10%) SOLN Take 15 mLs (20 mEq total) by mouth 2 (two) times daily. 12/23/19   [name], MD  sildenafil (REVATIO) 20 MG tablet TAKE TWO TABLETS BY MOUTH THREE TIMES A DAY 11/02/20   Lyda Jester M, PA-C  simvastatin (ZOCOR) 10 MG tablet Take 10 mg by mouth at bedtime.    [provider]  torsemide (DEMADEX) 20 MG tablet Take 2 tablets (40 mg total) by mouth daily. 03/03/20   Bensimhon, Shaune Pascal, MD     Allergies    Patient has no known allergies.   Review of Systems   Review of Systems A comprehensive review of systems was completed and negative except as noted in HPI.    Physical Exam BP (!) 115/55   Pulse 85   Temp 99.5 F (37.5 C) (Oral)   Resp 20   SpO2 96%   Physical Exam Vitals and nursing note reviewed.  Constitutional:      General: He is not in acute distress.    Appearance: Normal appearance. He is not ill-appearing.  HENT:     Head: Normocephalic and atraumatic.     Nose: Nose normal.     Mouth/Throat:     Mouth: Mucous membranes are moist.  Eyes:     Extraocular Movements: Extraocular movements intact.     Conjunctiva/sclera: Conjunctivae normal.  Cardiovascular:     Rate and Rhythm: Normal rate.  Pulmonary:     Effort: Pulmonary effort is normal.     Breath sounds: Wheezing present.  Abdominal:     General: Abdomen is flat.     Palpations: Abdomen is soft.     Tenderness: There is no abdominal tenderness.  Musculoskeletal:        General: No swelling. Normal range of motion.     Cervical back: Neck supple.  Skin:    General: Skin is warm and dry.  Neurological:     General: No focal deficit present.     Mental Status: He is alert.  Psychiatric:        Mood and Affect: Mood normal.       ED Results / Procedures / Treatments   Labs (all labs ordered are listed, but only abnormal results are displayed)  Labs Reviewed  CULTURE, BLOOD (ROUTINE X 2)  CULTURE, BLOOD (ROUTINE X 2)  COMPREHENSIVE METABOLIC PANEL  LACTIC ACID, PLASMA  LACTIC ACID, PLASMA  CBC WITH DIFFERENTIAL/PLATELET  PROTIME-INR  URINALYSIS, ROUTINE  W REFLEX MICROSCOPIC    EKG None  Radiology No results found.  Procedures Procedures  Medications Ordered in the ED Medications - No data to display   MDM Rules/Calculators/A&P MDM Patient brought in as a Code Sepsis, but actually has fairly normal vitals on arrival. He does not meet sepsis criteria. Looks comfortable now. Will check labs, CXR, EKG and reassess.   ED Course  I have reviewed the triage vital signs and the nursing notes.  Pertinent labs & imaging results that were available during my care of the patient were reviewed by me and considered in my medical decision making (see chart for details).  Clinical Course as of 11/13/20 2309  Nancy Fetter Nov 13, 2020  2305 Care of the patient signed out to Dr. Sedonia Small at the change of shift.  [CS]    Clinical Course User Index [CS] Truddie Hidden, MD    Final Clinical Impression(s) / ED Diagnoses Final diagnoses:  None    Rx / DC Orders ED Discharge Orders    None       Truddie Hidden, MD 11/13/20 2309

## 2020-11-14 ENCOUNTER — Emergency Department (HOSPITAL_COMMUNITY): Payer: Medicare Other

## 2020-11-14 DIAGNOSIS — R3129 Other microscopic hematuria: Secondary | ICD-10-CM | POA: Diagnosis present

## 2020-11-14 DIAGNOSIS — J9621 Acute and chronic respiratory failure with hypoxia: Secondary | ICD-10-CM | POA: Diagnosis present

## 2020-11-14 DIAGNOSIS — I5082 Biventricular heart failure: Secondary | ICD-10-CM | POA: Diagnosis present

## 2020-11-14 DIAGNOSIS — J1282 Pneumonia due to coronavirus disease 2019: Secondary | ICD-10-CM | POA: Diagnosis present

## 2020-11-14 DIAGNOSIS — E039 Hypothyroidism, unspecified: Secondary | ICD-10-CM | POA: Diagnosis present

## 2020-11-14 DIAGNOSIS — R6520 Severe sepsis without septic shock: Secondary | ICD-10-CM | POA: Diagnosis present

## 2020-11-14 DIAGNOSIS — C679 Malignant neoplasm of bladder, unspecified: Secondary | ICD-10-CM | POA: Diagnosis present

## 2020-11-14 DIAGNOSIS — A4189 Other specified sepsis: Secondary | ICD-10-CM | POA: Diagnosis present

## 2020-11-14 DIAGNOSIS — E876 Hypokalemia: Secondary | ICD-10-CM | POA: Diagnosis present

## 2020-11-14 DIAGNOSIS — Z66 Do not resuscitate: Secondary | ICD-10-CM | POA: Diagnosis present

## 2020-11-14 DIAGNOSIS — I248 Other forms of acute ischemic heart disease: Secondary | ICD-10-CM | POA: Diagnosis present

## 2020-11-14 DIAGNOSIS — I714 Abdominal aortic aneurysm, without rupture: Secondary | ICD-10-CM | POA: Diagnosis present

## 2020-11-14 DIAGNOSIS — I2510 Atherosclerotic heart disease of native coronary artery without angina pectoris: Secondary | ICD-10-CM | POA: Diagnosis present

## 2020-11-14 DIAGNOSIS — R778 Other specified abnormalities of plasma proteins: Secondary | ICD-10-CM

## 2020-11-14 DIAGNOSIS — J440 Chronic obstructive pulmonary disease with acute lower respiratory infection: Secondary | ICD-10-CM | POA: Diagnosis present

## 2020-11-14 DIAGNOSIS — Z85828 Personal history of other malignant neoplasm of skin: Secondary | ICD-10-CM | POA: Diagnosis not present

## 2020-11-14 DIAGNOSIS — H9190 Unspecified hearing loss, unspecified ear: Secondary | ICD-10-CM | POA: Diagnosis present

## 2020-11-14 DIAGNOSIS — I959 Hypotension, unspecified: Secondary | ICD-10-CM | POA: Diagnosis present

## 2020-11-14 DIAGNOSIS — G4730 Sleep apnea, unspecified: Secondary | ICD-10-CM | POA: Diagnosis present

## 2020-11-14 DIAGNOSIS — I2729 Other secondary pulmonary hypertension: Secondary | ICD-10-CM | POA: Diagnosis present

## 2020-11-14 DIAGNOSIS — E785 Hyperlipidemia, unspecified: Secondary | ICD-10-CM | POA: Diagnosis present

## 2020-11-14 DIAGNOSIS — I5032 Chronic diastolic (congestive) heart failure: Secondary | ICD-10-CM | POA: Diagnosis present

## 2020-11-14 DIAGNOSIS — A419 Sepsis, unspecified organism: Secondary | ICD-10-CM | POA: Diagnosis present

## 2020-11-14 DIAGNOSIS — N3289 Other specified disorders of bladder: Secondary | ICD-10-CM

## 2020-11-14 DIAGNOSIS — U071 COVID-19: Secondary | ICD-10-CM | POA: Diagnosis present

## 2020-11-14 DIAGNOSIS — E871 Hypo-osmolality and hyponatremia: Secondary | ICD-10-CM | POA: Diagnosis present

## 2020-11-14 LAB — COMPREHENSIVE METABOLIC PANEL
ALT: 16 U/L (ref 0–44)
AST: 28 U/L (ref 15–41)
Albumin: 2.9 g/dL — ABNORMAL LOW (ref 3.5–5.0)
Alkaline Phosphatase: 78 U/L (ref 38–126)
Anion gap: 15 (ref 5–15)
BUN: 25 mg/dL — ABNORMAL HIGH (ref 8–23)
CO2: 24 mmol/L (ref 22–32)
Calcium: 7.8 mg/dL — ABNORMAL LOW (ref 8.9–10.3)
Chloride: 93 mmol/L — ABNORMAL LOW (ref 98–111)
Creatinine, Ser: 1.23 mg/dL (ref 0.61–1.24)
GFR, Estimated: 54 mL/min — ABNORMAL LOW (ref >60)
Glucose, Bld: 87 mg/dL (ref 70–99)
Potassium: 3.2 mmol/L — ABNORMAL LOW (ref 3.5–5.1)
Sodium: 132 mmol/L — ABNORMAL LOW (ref 135–145)
Total Bilirubin: 1.5 mg/dL — ABNORMAL HIGH (ref 0.3–1.2)
Total Protein: 7 g/dL (ref 6.5–8.1)

## 2020-11-14 LAB — SARS CORONAVIRUS 2 BY RT PCR (HOSPITAL ORDER, PERFORMED IN ~~LOC~~ HOSPITAL LAB): SARS Coronavirus 2: POSITIVE — AB

## 2020-11-14 LAB — D-DIMER, QUANTITATIVE: D-Dimer, Quant: 2.15 ug/mL-FEU — ABNORMAL HIGH (ref 0.00–0.50)

## 2020-11-14 LAB — LACTATE DEHYDROGENASE: LDH: 166 U/L (ref 98–192)

## 2020-11-14 LAB — FIBRINOGEN: Fibrinogen: 475 mg/dL (ref 210–475)

## 2020-11-14 LAB — PROCALCITONIN: Procalcitonin: 1.69 ng/mL

## 2020-11-14 LAB — TROPONIN I (HIGH SENSITIVITY): Troponin I (High Sensitivity): 46 ng/L — ABNORMAL HIGH (ref <18)

## 2020-11-14 LAB — C-REACTIVE PROTEIN: CRP: 4.8 mg/dL — ABNORMAL HIGH (ref <1.0)

## 2020-11-14 LAB — FERRITIN: Ferritin: 69 ng/mL (ref 24–336)

## 2020-11-14 LAB — MAGNESIUM: Magnesium: 1.6 mg/dL — ABNORMAL LOW (ref 1.7–2.4)

## 2020-11-14 LAB — LACTIC ACID, PLASMA: Lactic Acid, Venous: 1.6 mmol/L (ref 0.5–1.9)

## 2020-11-14 MED ORDER — METHYLPREDNISOLONE SODIUM SUCC 40 MG IJ SOLR
0.5000 mg/kg | Freq: Two times a day (BID) | INTRAMUSCULAR | Status: AC
  Administered 2020-11-14 – 2020-11-16 (×6): 34.8 mg via INTRAVENOUS
  Filled 2020-11-14 (×6): qty 1

## 2020-11-14 MED ORDER — VITAMIN D 25 MCG (1000 UNIT) PO TABS
1000.0000 [IU] | ORAL_TABLET | Freq: Every day | ORAL | Status: DC
  Administered 2020-11-14 – 2020-11-18 (×5): 1000 [IU] via ORAL
  Filled 2020-11-14 (×6): qty 1

## 2020-11-14 MED ORDER — GUAIFENESIN-DM 100-10 MG/5ML PO SYRP: 10.0000 mL | ORAL_SOLUTION | ORAL | Status: DC | PRN

## 2020-11-14 MED ORDER — MOMETASONE FURO-FORMOTEROL FUM 100-5 MCG/ACT IN AERO
2.0000 | INHALATION_SPRAY | Freq: Two times a day (BID) | RESPIRATORY_TRACT | Status: DC
  Administered 2020-11-14 – 2020-11-18 (×6): 2 via RESPIRATORY_TRACT
  Filled 2020-11-14: qty 8.8

## 2020-11-14 MED ORDER — PREDNISONE 50 MG PO TABS
50.0000 mg | ORAL_TABLET | Freq: Every day | ORAL | Status: DC
  Administered 2020-11-17 – 2020-11-18 (×2): 50 mg via ORAL
  Filled 2020-11-14 (×2): qty 1

## 2020-11-14 MED ORDER — PIPERACILLIN-TAZOBACTAM 3.375 G IVPB
3.3750 g | Freq: Three times a day (TID) | INTRAVENOUS | Status: DC
  Administered 2020-11-14 – 2020-11-17 (×10): 3.375 g via INTRAVENOUS
  Filled 2020-11-14 (×11): qty 50

## 2020-11-14 MED ORDER — SILDENAFIL CITRATE 20 MG PO TABS
40.0000 mg | ORAL_TABLET | Freq: Three times a day (TID) | ORAL | Status: DC
  Administered 2020-11-14 – 2020-11-18 (×13): 40 mg via ORAL
  Filled 2020-11-14 (×13): qty 2

## 2020-11-14 MED ORDER — MACITENTAN 10 MG PO TABS
10.0000 mg | ORAL_TABLET | Freq: Every day | ORAL | Status: DC
  Administered 2020-11-14 – 2020-11-18 (×5): 10 mg via ORAL
  Filled 2020-11-14 (×5): qty 1

## 2020-11-14 MED ORDER — IOHEXOL 350 MG/ML SOLN
100.0000 mL | Freq: Once | INTRAVENOUS | Status: AC | PRN
  Administered 2020-11-14: 100 mL via INTRAVENOUS

## 2020-11-14 MED ORDER — FLUTICASONE-SALMETEROL(SENSOR) 113-14 MCG/ACT IN AEPB: 1.0000 | INHALATION_SPRAY | Freq: Two times a day (BID) | RESPIRATORY_TRACT | Status: DC

## 2020-11-14 MED ORDER — FLUTICASONE PROPIONATE 50 MCG/ACT NA SUSP: 1.0000 | Freq: Every day | NASAL | Status: DC | PRN

## 2020-11-14 MED ORDER — ASCORBIC ACID 500 MG PO TABS
500.0000 mg | ORAL_TABLET | Freq: Every day | ORAL | Status: DC
  Administered 2020-11-14 – 2020-11-18 (×5): 500 mg via ORAL
  Filled 2020-11-14 (×4): qty 1

## 2020-11-14 MED ORDER — SODIUM CHLORIDE 0.9 % IV BOLUS
1000.0000 mL | Freq: Once | INTRAVENOUS | Status: AC
  Administered 2020-11-14: 1000 mL via INTRAVENOUS

## 2020-11-14 MED ORDER — POTASSIUM CHLORIDE 10 MEQ/100ML IV SOLN
10.0000 meq | INTRAVENOUS | Status: AC
  Administered 2020-11-14: 10 meq via INTRAVENOUS
  Filled 2020-11-14: qty 100

## 2020-11-14 MED ORDER — SODIUM CHLORIDE 0.9 % IV BOLUS
500.0000 mL | Freq: Once | INTRAVENOUS | Status: AC
  Administered 2020-11-14: 500 mL via INTRAVENOUS

## 2020-11-14 MED ORDER — SODIUM CHLORIDE 0.9 % IV SOLN
100.0000 mg | Freq: Every day | INTRAVENOUS | Status: AC
  Administered 2020-11-15 – 2020-11-18 (×4): 100 mg via INTRAVENOUS
  Filled 2020-11-14 (×4): qty 20

## 2020-11-14 MED ORDER — SODIUM CHLORIDE 0.9 % IV SOLN
200.0000 mg | Freq: Once | INTRAVENOUS | Status: AC
  Administered 2020-11-14: 200 mg via INTRAVENOUS
  Filled 2020-11-14: qty 200

## 2020-11-14 MED ORDER — HYDROCOD POLST-CPM POLST ER 10-8 MG/5ML PO SUER: 5.0000 mL | Freq: Two times a day (BID) | ORAL | Status: DC | PRN

## 2020-11-14 MED ORDER — LORATADINE 10 MG PO TABS: 10.0000 mg | ORAL_TABLET | Freq: Every day | ORAL | Status: DC | PRN

## 2020-11-14 MED ORDER — POTASSIUM CHLORIDE 10 MEQ/100ML IV SOLN: 10.0000 meq | INTRAVENOUS | Status: DC

## 2020-11-14 MED ORDER — ALBUTEROL SULFATE HFA 108 (90 BASE) MCG/ACT IN AERS
2.0000 | INHALATION_SPRAY | RESPIRATORY_TRACT | Status: DC | PRN
  Filled 2020-11-14: qty 6.7

## 2020-11-14 MED ORDER — SIMVASTATIN 20 MG PO TABS
10.0000 mg | ORAL_TABLET | Freq: Every day | ORAL | Status: DC
  Administered 2020-11-14 – 2020-11-17 (×4): 10 mg via ORAL
  Filled 2020-11-14 (×4): qty 1

## 2020-11-14 MED ORDER — LEVOTHYROXINE SODIUM 100 MCG PO TABS
100.0000 ug | ORAL_TABLET | Freq: Every day | ORAL | Status: DC
  Administered 2020-11-14 – 2020-11-18 (×5): 100 ug via ORAL
  Filled 2020-11-14 (×5): qty 1

## 2020-11-14 MED ORDER — ACETAMINOPHEN 325 MG PO TABS
650.0000 mg | ORAL_TABLET | Freq: Four times a day (QID) | ORAL | Status: DC | PRN
  Filled 2020-11-14: qty 2

## 2020-11-14 MED ORDER — PANTOPRAZOLE SODIUM 20 MG PO TBEC
20.0000 mg | DELAYED_RELEASE_TABLET | Freq: Two times a day (BID) | ORAL | Status: DC
  Administered 2020-11-14 – 2020-11-18 (×9): 20 mg via ORAL
  Filled 2020-11-14 (×9): qty 1

## 2020-11-14 MED ORDER — ZINC SULFATE 220 (50 ZN) MG PO CAPS
220.0000 mg | ORAL_CAPSULE | Freq: Every day | ORAL | Status: DC
  Administered 2020-11-14 – 2020-11-18 (×5): 220 mg via ORAL
  Filled 2020-11-14 (×5): qty 1

## 2020-11-14 MED ORDER — ENOXAPARIN SODIUM 40 MG/0.4ML ~~LOC~~ SOLN
40.0000 mg | SUBCUTANEOUS | Status: DC
  Administered 2020-11-14 – 2020-11-16 (×3): 40 mg via SUBCUTANEOUS
  Filled 2020-11-14 (×5): qty 0.4

## 2020-11-14 MED ORDER — MAGNESIUM SULFATE 2 GM/50ML IV SOLN
2.0000 g | Freq: Once | INTRAVENOUS | Status: AC
  Administered 2020-11-14: 2 g via INTRAVENOUS
  Filled 2020-11-14: qty 50

## 2020-11-14 MED ORDER — MONTELUKAST SODIUM 10 MG PO TABS
10.0000 mg | ORAL_TABLET | Freq: Every day | ORAL | Status: DC
  Administered 2020-11-14 – 2020-11-17 (×4): 10 mg via ORAL
  Filled 2020-11-14 (×4): qty 1

## 2020-11-14 MED ORDER — MIDODRINE HCL 5 MG PO TABS
15.0000 mg | ORAL_TABLET | Freq: Three times a day (TID) | ORAL | Status: DC
  Administered 2020-11-14 – 2020-11-18 (×13): 15 mg via ORAL
  Filled 2020-11-14 (×13): qty 3

## 2020-11-14 NOTE — H&P (Signed)
History and Physical    Vincent Robinson [id number] DOB: 09-May-1923 DOA: 11/13/2020  PCP: Katherina Mires, MD Patient coming from: Home  Chief Complaint: Shortness of breath  HPI: Vincent Robinson is a 87 y.o. male with medical history significant of asthma, COPD, chronic respiratory failure on home oxygen, CAD, hyperlipidemia, hypothyroidism, chronic diastolic heart failure, pulmonary hypertension, hospital admission in November 2021 for septic shock secondary to acute cholecystitis status post percutaneous gallbladder drain placement presenting to the ED via EMS for evaluation of shortness of breath.  EMS reported oxygen saturation of 70% on 5 L home oxygen and his fingers were blue.  He was tachypneic with labored breathing.  He was placed on nonrebreather with improvement of his oxygen saturation.  Patient states for the past 1 day he is feeling very cold.  He is not sure if he has had any fevers.  Reports having a chronic cough due to COPD.  He feels he is more short of breath recently.  He uses 3 to 4 L home oxygen chronically and noticed that his sats were dropping to the low 80s.  Denies chest pain.  He felt nauseous earlier in the ambulance but did not vomit.  Denies abdominal pain.  He has received 2 doses of the Covid vaccine but has not received his booster shot yet.  States he has bladder cancer for which he is seen by Dr. Diona Fanti who discussed removing the tumor but his cardiologist felt that surgery was not safe for him.  ED Course: Temperature 99.5 F.  Tachycardic and tachypneic.  Hypotensive with systolic in the [id number].  Required 10 L oxygen via HFNC to maintain sats in the 90s.  SARS-CoV-2 PCR test positive.  WBC 14.6, hemoglobin 14.3, platelet count 322K.  Sodium 132, potassium 2.8, chloride 91, bicarb 23, BUN 26, creatinine 1.1, glucose 95.  No significant elevation of LFTs.  Lactic acid normal x2.  INR 1.1.  Blood culture x2 pending.  BNP 299.  High-sensitivity troponin 36 > 46.  UA  showing microscopic hematuria but not suggestive of infection.  Chest x-ray showing cardiac enlargement with pulmonary vascular congestion and interstitial edema.    CT angiogram chest (evaluation limited by respiratory motion artifact) showing no central PE.  Showing bilateral lower lobe airspace consolidation concerning for pneumonia or aspiration.  CT abdomen pelvis showing findings concerning for questionable early uncomplicated diverticulitis.  Showing stable cholecystostomy tube with decompression of the gallbladder.  Patient was given Zosyn and a 500 cc normal saline bolus.  Review of Systems:  All systems reviewed and apart from history of presenting illness, are negative.  Past Medical History:  Diagnosis Date  . AAA (abdominal aortic aneurysm) ([place])   . Acute respiratory failure ([place]) 11/23/2019  . Asthma   . Basal cell carcinoma   . Chronic respiratory failure ([place])   . COPD (chronic obstructive pulmonary disease) ([place])   . Coronary artery disease   . History of chronic respiratory failure   . Hyperlipidemia   . Hypothyroidism   . Right heart failure ([place])   . Skin cancer 2017  . Sleep apnea     Past Surgical History:  Procedure Laterality Date  . bleeding ulcer  2016  . BYPASS AXILLA/BRACHIAL ARTERY  1998  . colon bleed  2012  . COLONOSCOPY N/A 06/07/2017   Procedure: COLONOSCOPY;  Surgeon: Carol Ada, MD;  Location: [facility];  Service: Endoscopy;  Laterality: N/A;  . DEBRIDMENT OF DECUBITUS ULCER  2002  . IR  ANGIOGRAM SELECTIVE EACH ADDITIONAL VESSEL  03/22/2020  . IR ANGIOGRAM VISCERAL SELECTIVE  03/22/2020  . IR PERC CHOLECYSTOSTOMY  09/08/2020  . IR RADIOLOGIST EVAL & MGMT  10/06/2020  . IR RADIOLOGIST EVAL & MGMT  10/26/2020  . IR US GUIDE VASC ACCESS RIGHT  03/22/2020  . RIGHT HEART CATH N/A 12/11/2017   Procedure: RIGHT HEART CATH;  Surgeon: Jolaine Artist, MD;  Location: [place] [facility];  Service: Cardiovascular;  Laterality: N/A;  . THORACIC  AORTA STENT  2009     reports that he quit smoking about 59 years ago. He smoked 0.50 packs per day. He has never used smokeless tobacco. He reports previous alcohol use. He reports that he does not use drugs.  No Known Allergies  Family History  Problem Relation Age of Onset  . Emphysema Mother   . Stroke Father   . Movement disorder Sister   . Aneurysm Brother        Thoracic aorta  . Macular degeneration Brother     Prior to Admission medications   Medication Sig Start Date End Date Taking? Authorizing Provider  albuterol (PROVENTIL HFA;VENTOLIN HFA) 108 (90 Base) MCG/ACT inhaler Inhale 1 puff into the lungs 2 (two) times daily. Patient taking differently: Inhale 1 puff into the lungs every 4 (four) hours as needed. 05/20/17  Yes Mannam, Praveen, MD  fluticasone (FLONASE) 50 MCG/ACT nasal spray Place 1 spray into both nostrils daily as needed for allergies or rhinitis.   Yes [provider]  Fluticasone-Salmeterol,sensor, 113-14 MCG/ACT AEPB Inhale 1 puff into the lungs 2 (two) times daily. 09/29/20  Yes Lyda Jester M, PA-C  levothyroxine (SYNTHROID) 100 MCG tablet Take 1 tablet (100 mcg total) by mouth daily at 6 (six) AM. 09/20/20  Yes Noemi Chapel P, DO  loratadine (CLARITIN) 10 MG tablet Take 10 mg by mouth daily as needed for allergies.    Yes [provider]  macitentan (OPSUMIT) 10 MG tablet Take 1 tablet (10 mg total) by mouth daily. Needs appt for future refills 08/19/20  Yes Bensimhon, Shaune Pascal, MD  midodrine (PROAMATINE) 10 MG tablet Take 1.5 tablets (15 mg total) by mouth 3 (three) times daily after meals. 09/19/20  Yes Noemi Chapel P, DO  montelukast (SINGULAIR) 10 MG tablet Take 10 mg by mouth at bedtime.   Yes [provider]  pantoprazole (PROTONIX) 20 MG tablet Take 20 mg by mouth 2 (two) times daily.    Yes [provider]  potassium chloride SA (KLOR-CON) 20 MEQ tablet Take 20 mEq by mouth in the morning and at bedtime.  09/27/20  Yes [provider]  sildenafil (REVATIO) 20 MG tablet TAKE TWO TABLETS BY MOUTH THREE TIMES A DAY Patient taking differently: Take 40 mg by mouth 3 (three) times daily. 11/02/20  Yes Lyda Jester M, PA-C  simvastatin (ZOCOR) 10 MG tablet Take 10 mg by mouth at bedtime.   Yes [provider]  torsemide (DEMADEX) 20 MG tablet Take 2 tablets (40 mg total) by mouth daily. 03/03/20  Yes Bensimhon, Shaune Pascal, MD    Physical Exam: Vitals:   11/14/20 0230 11/14/20 0245 11/14/20 0300 11/14/20 0315  BP: (!) 89/52 110/60 (!) 105/52 (!) 120/92  Pulse: (!) 34 100 (!) 103 97  Resp: 19 18 (!) 23 (!) 32  Temp:      TempSrc:      SpO2: 93% 95% 94% 92%    Physical Exam Constitutional:      General: He is  not in acute distress. HENT:     Head: Normocephalic and atraumatic.  Eyes:     Extraocular Movements: Extraocular movements intact.     Conjunctiva/sclera: Conjunctivae normal.  Cardiovascular:     Rate and Rhythm: Normal rate and regular rhythm.     Pulses: Normal pulses.  Pulmonary:     Effort: Pulmonary effort is normal.     Breath sounds: No wheezing or rales.     Comments: Satting around 93% on 8 L HFNC Abdominal:     General: Bowel sounds are normal. There is no distension.     Palpations: Abdomen is soft.     Tenderness: There is no abdominal tenderness. There is no guarding.  Musculoskeletal:        General: No swelling or tenderness.     Cervical back: Normal range of motion and neck supple.  Skin:    General: Skin is warm and dry.  Neurological:     General: No focal deficit present.     Mental Status: He is alert and oriented to person, place, and time.     Labs on Admission: I have personally reviewed following labs and imaging studies  CBC: Recent Labs  Lab 11/13/20 2234  WBC 14.6*  NEUTROABS 13.1*  HGB 14.3  HCT 43.3  MCV 94.3  PLT [id number]   Basic Metabolic Panel: Recent Labs  Lab 11/13/20 2234  NA 132*  K 2.8*  CL 91*  CO2  23  GLUCOSE 95  BUN 26*  CREATININE 1.16  CALCIUM 8.3*   GFR: CrCl cannot be calculated (Unknown ideal weight.). Liver Function Tests: Recent Labs  Lab 11/13/20 2234  AST 29  ALT 20  ALKPHOS 98  BILITOT 1.3*  PROT 8.2*  ALBUMIN 3.4*   No results for input(s): LIPASE, AMYLASE in the last 168 hours. No results for input(s): AMMONIA in the last 168 hours. Coagulation Profile: Recent Labs  Lab 11/13/20 2234  INR 1.1   Cardiac Enzymes: No results for input(s): CKTOTAL, CKMB, CKMBINDEX, TROPONINI in the last 168 hours. BNP (last 3 results) No results for input(s): PROBNP in the last 8760 hours. HbA1C: No results for input(s): HGBA1C in the last 72 hours. CBG: No results for input(s): GLUCAP in the last 168 hours. Lipid Profile: No results for input(s): CHOL, HDL, LDLCALC, TRIG, CHOLHDL, LDLDIRECT in the last 72 hours. Thyroid Function Tests: No results for input(s): TSH, T4TOTAL, FREET4, T3FREE, THYROIDAB in the last 72 hours. Anemia Panel: No results for input(s): VITAMINB12, FOLATE, FERRITIN, TIBC, IRON, RETICCTPCT in the last 72 hours. Urine analysis:    Component Value Date/Time   COLORURINE YELLOW 11/13/2020 2235   APPEARANCEUR CLEAR 11/13/2020 2235   LABSPEC 1.009 11/13/2020 2235   PHURINE 5.0 11/13/2020 2235   GLUCOSEU NEGATIVE 11/13/2020 2235   HGBUR LARGE (A) 11/13/2020 2235   BILIRUBINUR NEGATIVE 11/13/2020 2235   KETONESUR NEGATIVE 11/13/2020 2235   PROTEINUR NEGATIVE 11/13/2020 2235   NITRITE NEGATIVE 11/13/2020 [place] 11/13/2020 2235    Radiological Exams on Admission: CT ANGIO CHEST PE W OR WO CONTRAST  Result Date: 11/14/2020 CLINICAL DATA:  Shortness of breath.  COPD.  Sepsis. EXAM: CT ANGIOGRAPHY CHEST CT ABDOMEN AND PELVIS WITH CONTRAST TECHNIQUE: Multidetector CT imaging of the chest was performed using the standard protocol during bolus administration of intravenous contrast. Multiplanar CT image reconstructions and MIPs  were obtained to evaluate the vascular anatomy. Multidetector CT imaging of the abdomen and pelvis was performed using the standard protocol  during bolus administration of intravenous contrast. CONTRAST:  123mL OMNIPAQUE IOHEXOL 350 MG/ML SOLN COMPARISON:  CT dated September 08, 2020. FINDINGS: CTA CHEST FINDINGS Cardiovascular: Evaluation is limited by respiratory motion artifact. Within that limitation, there is no central pulmonary embolus. The size of the main pulmonary artery is normal. Cardiomegaly with coronary artery calcification. Atherosclerotic changes of the thoracic aorta. There is an ascending thoracic aortic aneurysm measuring 4.5 cm. Atherosclerotic changes are noted. Mediastinum/Nodes: -- No mediastinal lymphadenopathy. -- No hilar lymphadenopathy. -- No axillary lymphadenopathy. -- No supraclavicular lymphadenopathy. -- Normal thyroid gland where visualized. -  Unremarkable esophagus. Lungs/Pleura: There is airspace consolidation at the lung bases. There is bronchial wall thickening and areas of mucous plugging primarily in the lower lobes. Emphysematous changes are noted. There is no pneumothorax. Musculoskeletal: No chest wall abnormality. No bony spinal canal stenosis. Review of the MIP images confirms the above findings. CT ABDOMEN and PELVIS FINDINGS Hepatobiliary: The liver is cirrhotic in appearance. There is a cholecystostomy tube in place. The gallbladder is decompressed. The tube appears grossly well position.There is no biliary ductal dilation. Pancreas: Normal contours without ductal dilatation. No peripancreatic fluid collection. Spleen: Unremarkable. Adrenals/Urinary Tract: --Adrenal glands: Unremarkable. --Right kidney/ureter: No hydronephrosis or radiopaque kidney stones. --Left kidney/ureter: No hydronephrosis or radiopaque kidney stones. --Urinary bladder: Again noted is an enhancing mass along the right lateral wall of the urinary bladder. This mass appears grossly similar in size  to prior study. Stomach/Bowel: --Stomach/Duodenum: No hiatal hernia or other gastric abnormality. Normal duodenal course and caliber. --Small bowel: Unremarkable. --Colon: There is diffuse sigmoid diverticulosis with questionable early uncomplicated diverticulitis involving the sigmoid colon (axial series 5, image 51). --Appendix: Not visualized. No right lower quadrant inflammation or free fluid. Vascular/Lymphatic: The patient is status post prior EVAR. The excluded aneurysm sac is essentially stable from prior study. --No retroperitoneal lymphadenopathy. --No mesenteric lymphadenopathy. --No pelvic or inguinal lymphadenopathy. Reproductive: Unremarkable Other: No ascites or free air. The abdominal wall is normal. Musculoskeletal. No acute displaced fractures. Review of the MIP images confirms the above findings. IMPRESSION: 1. Evaluation is limited by respiratory motion artifact. Within that limitation, there is no central pulmonary embolus. 2. Bilateral lower lobe airspace consolidation, concerning for pneumonia or aspiration. 3. Diffuse sigmoid diverticulosis with questionable early uncomplicated diverticulitis involving the sigmoid colon. 4. Persistent enhancing mass along the right lateral wall of the urinary bladder, concerning for bladder carcinoma. 5. Cirrhosis. 6. Stable abdominal and thoracic aortic aneurysms as detailed above. Please see prior reports for follow-up recommendations. 7. Stable cholecystostomy tube with decompression of the gallbladder. Aortic Atherosclerosis (ICD10-I70.0) and Emphysema (ICD10-J43.9). Electronically Signed   By: Constance Holster M.D.   On: 11/14/2020 01:04   CT ABDOMEN PELVIS W CONTRAST  Result Date: 11/14/2020 CLINICAL DATA:  Shortness of breath.  COPD.  Sepsis. EXAM: CT ANGIOGRAPHY CHEST CT ABDOMEN AND PELVIS WITH CONTRAST TECHNIQUE: Multidetector CT imaging of the chest was performed using the standard protocol during bolus administration of intravenous contrast.  Multiplanar CT image reconstructions and MIPs were obtained to evaluate the vascular anatomy. Multidetector CT imaging of the abdomen and pelvis was performed using the standard protocol during bolus administration of intravenous contrast. CONTRAST:  180mL OMNIPAQUE IOHEXOL 350 MG/ML SOLN COMPARISON:  CT dated September 08, 2020. FINDINGS: CTA CHEST FINDINGS Cardiovascular: Evaluation is limited by respiratory motion artifact. Within that limitation, there is no central pulmonary embolus. The size of the main pulmonary artery is normal. Cardiomegaly with coronary artery calcification. Atherosclerotic changes of the thoracic aorta.  There is an ascending thoracic aortic aneurysm measuring 4.5 cm. Atherosclerotic changes are noted. Mediastinum/Nodes: -- No mediastinal lymphadenopathy. -- No hilar lymphadenopathy. -- No axillary lymphadenopathy. -- No supraclavicular lymphadenopathy. -- Normal thyroid gland where visualized. -  Unremarkable esophagus. Lungs/Pleura: There is airspace consolidation at the lung bases. There is bronchial wall thickening and areas of mucous plugging primarily in the lower lobes. Emphysematous changes are noted. There is no pneumothorax. Musculoskeletal: No chest wall abnormality. No bony spinal canal stenosis. Review of the MIP images confirms the above findings. CT ABDOMEN and PELVIS FINDINGS Hepatobiliary: The liver is cirrhotic in appearance. There is a cholecystostomy tube in place. The gallbladder is decompressed. The tube appears grossly well position.There is no biliary ductal dilation. Pancreas: Normal contours without ductal dilatation. No peripancreatic fluid collection. Spleen: Unremarkable. Adrenals/Urinary Tract: --Adrenal glands: Unremarkable. --Right kidney/ureter: No hydronephrosis or radiopaque kidney stones. --Left kidney/ureter: No hydronephrosis or radiopaque kidney stones. --Urinary bladder: Again noted is an enhancing mass along the right lateral wall of the urinary  bladder. This mass appears grossly similar in size to prior study. Stomach/Bowel: --Stomach/Duodenum: No hiatal hernia or other gastric abnormality. Normal duodenal course and caliber. --Small bowel: Unremarkable. --Colon: There is diffuse sigmoid diverticulosis with questionable early uncomplicated diverticulitis involving the sigmoid colon (axial series 5, image 51). --Appendix: Not visualized. No right lower quadrant inflammation or free fluid. Vascular/Lymphatic: The patient is status post prior EVAR. The excluded aneurysm sac is essentially stable from prior study. --No retroperitoneal lymphadenopathy. --No mesenteric lymphadenopathy. --No pelvic or inguinal lymphadenopathy. Reproductive: Unremarkable Other: No ascites or free air. The abdominal wall is normal. Musculoskeletal. No acute displaced fractures. Review of the MIP images confirms the above findings. IMPRESSION: 1. Evaluation is limited by respiratory motion artifact. Within that limitation, there is no central pulmonary embolus. 2. Bilateral lower lobe airspace consolidation, concerning for pneumonia or aspiration. 3. Diffuse sigmoid diverticulosis with questionable early uncomplicated diverticulitis involving the sigmoid colon. 4. Persistent enhancing mass along the right lateral wall of the urinary bladder, concerning for bladder carcinoma. 5. Cirrhosis. 6. Stable abdominal and thoracic aortic aneurysms as detailed above. Please see prior reports for follow-up recommendations. 7. Stable cholecystostomy tube with decompression of the gallbladder. Aortic Atherosclerosis (ICD10-I70.0) and Emphysema (ICD10-J43.9). Electronically Signed   By: Constance Holster M.D.   On: 11/14/2020 01:04   DG Chest Port 1 View  Result Date: 11/13/2020 CLINICAL DATA:  Shortness of breath and decreased oxygen saturation. COPD with chronic oxygen use. EXAM: PORTABLE CHEST 1 VIEW COMPARISON:  09/18/2020 FINDINGS: Postoperative changes in the mediastinum. Cardiac  enlargement. Pulmonary vascular congestion with diffuse interstitial pattern likely edema but pneumonia could also have this appearance. Changes are more prominent than on the previous study. No definite pleural effusion. No pneumothorax. Mediastinal contours appear intact. Calcification of the aorta. IMPRESSION: Cardiac enlargement with pulmonary vascular congestion and interstitial edema. Electronically Signed   By: Lucienne Capers M.D.   On: 11/13/2020 22:26    EKG: Personally reviewed. Irregular rhythm, artifact. Repeat EKG ordered and currently pending.  Assessment/Plan Principal Problem:   Pneumonia due to COVID-19 virus Active Problems:   COPD (chronic obstructive pulmonary disease) ([facility])   Acute on chronic respiratory failure ([facility])   Elevated troponin   Bladder mass   Acute on chronic hypoxemic respiratory failure and severe sepsis secondary to COVID-19 viral pneumonia and possible aspiration pneumonia: Patient was satting 70% on 5 L oxygen at home per EMS.  Currently satting above 90% on 8 L HFNC.  Meets criteria  for sepsis -3 SIRS (fever, tachycardia, leukocytosis) and SARS-CoV-2 PCR test positive.  Although lactic acid normal x2, meets criteria for severe sepsis with hypotension. CT angiogram chest (evaluation limited by respiratory motion artifact) showing no central PE.  Showing bilateral lower lobe airspace consolidation concerning for pneumonia or aspiration. -Remdesivir -IV Solu-Medrol 0.5 mg/kg every 12 hours -Zosyn -Blood pressure has improved after 500 cc fluid bolus and no longer tachycardic. -Vitamin C, zinc, vitamin D -Antitussive as needed -Tylenol as needed -Bronchodilator as needed -Check inflammatory markers including ferritin, fibrinogen, D-dimer, CRP, LDH -Check procalcitonin level -Daily CBC with differential, CMP, CRP, D-dimer -Airborne and contact precautions -Incentive spirometry, flutter valve -Encourage prone positioning -Continuous pulse  ox -Supplemental oxygen as needed to keep oxygen saturation above 90% -Blood culture x2 pending -Encourage mobility-PT/OT -Keep n.p.o., SLP eval, aspiration precautions  ?Early diverticulitis: CT abdomen pelvis showing findings concerning for questionable early uncomplicated diverticulitis.  However, patient is not endorsing any abdominal pain.  Abdominal exam is benign. -Continue Zosyn as mentioned above  Elevated troponin: Likely due to demand ischemia in the setting of pneumonia. High-sensitivity troponin 36 > 46.  Patient is not endorsing chest pain. EKG showing an irregular rhythm but there is artifact making it difficult to tell whether this is A. fib. Discussed with Dr. Kalman Shan from cardiology was also reviewed the EKG and agrees that based on this EKG it is difficult to come to the conclusion that this is truly A. Fib. -Cardiac monitoring. Repeat EKG ordered and currently pending. Continue to trend troponin.  Bladder mass: CT showing a mass in the bladder concerning for bladder carcinoma which was seen on prior CTs from June 2021 in November 2021 as well.  According to CT today, this mass appears grossly similar in size to prior study.  Patient tells me he is aware that he has bladder cancer and is seen by urologist Dr. Diona Fanti who discussed removing the mass but his cardiologist felt surgery was not safe likely due to his advanced age/medical comorbidities. -Outpatient urology follow-up  Microscopic hematuria: Likely due to bladder mass. -Outpatient urology follow-up  Hypokalemia: Likely due to home diuretic use and poor oral intake in the setting of acute illness. -Replete potassium.  Check magnesium level and replete if low.  Continue to monitor electrolytes.  COPD: Stable.  No signs of acute exacerbation at this time. -Continue home inhalers  Hyperlipidemia -Continue Zocor  Hypothyroidism -Continue Synthroid  Chronic diastolic heart failure: BNP 299.  However, chest CT not  suggestive of pulmonary edema and clinically does not appear volume overloaded. -Hold diuretics at this time.  Continue to monitor volume status.  Pulmonary hypertension -Continue home macitentan, sildenafil  Hypotension -Continue home midodrine  DVT prophylaxis: Lovenox Code Status: Patient wishes to be DNR. Family Communication: No family available this time. Disposition Plan: Status is: Inpatient  Remains inpatient appropriate because:IV treatments appropriate due to intensity of illness or inability to take PO, Inpatient level of care appropriate due to severity of illness and Acute on chronic hypoxemic respiratory failure   Dispo: The patient is from: Home              Anticipated d/c is to: SNF              Anticipated d/c date is: 3 days              Patient currently is not medically stable to d/c.   Difficult to place patient No  The medical decision making on this patient  was of high complexity and the patient is at high risk for clinical deterioration, therefore this is a level 3 visit.  Shela Leff MD [facility]  If 7PM-7AM, please contact night-coverage [email or web address]  11/14/2020, 4:37 AM

## 2020-11-14 NOTE — Progress Notes (Signed)
Pharmacy Antibiotic Note  Vincent Robinson is a 87 y.o. male admitted on 11/13/2020 with pneumonia.  Pharmacy has been consulted for zosyn dosing.  Plan: Zosyn 3.375g IV q8h (4 hour infusion).  F/u renal function, cultures and clinical course     Temp (24hrs), Avg:99.5 F (37.5 C), Min:99.5 F (37.5 C), Max:99.5 F (37.5 C)  Recent Labs  Lab 11/13/20 2234 11/13/20 2335  WBC 14.6*  --   CREATININE 1.16  --   LATICACIDVEN 1.9 1.6    CrCl cannot be calculated (Unknown ideal weight.).    No Known Allergies  Thank you for allowing pharmacy to be a part of this patient's care.  Beverlee Nims 11/14/2020 4:33 AM

## 2020-11-14 NOTE — Progress Notes (Signed)
PROGRESS NOTE    Vincent Robinson  [id number] DOB: Sep 21, 1923 DOA: 11/13/2020 PCP: Katherina Mires, MD   Brief Narrative: taken from H&P. Vincent Robinson is a 87 y.o. male with medical history significant of asthma, COPD, chronic respiratory failure on home oxygen, CAD, hyperlipidemia, hypothyroidism, chronic diastolic heart failure, pulmonary hypertension, hospital admission in November 2021 for septic shock secondary to acute cholecystitis status post percutaneous gallbladder drain placement presenting to the ED via EMS for evaluation of shortness of breath.  EMS reported oxygen saturation of 70% on 5 L home oxygen and his fingers were blue.  He was tachypneic with labored breathing.  He was placed on nonrebreather with improvement of his oxygen saturation.  Patient states for the past 1 day he is feeling very cold.  He is not sure if he has had any fevers.  Reports having a chronic cough due to COPD.  He feels he is more short of breath recently.  He uses 3 to 4 L home oxygen chronically and noticed that his sats were dropping to the low 80s.  Denies chest pain.  He felt nauseous earlier in the ambulance but did not vomit.  Denies abdominal pain.  He has received 2 doses of the Covid vaccine but has not received his booster shot yet.  States he has bladder cancer for which he is seen by Dr. Diona Fanti who discussed removing the tumor but his cardiologist felt that surgery was not safe for him.  He was found to be positive for COVID-19.  Leukocytosis, hypokalemia, BNP 299, chest x-ray with cardiac enlargement and pulmonary vascular congestion.  CTA was negative for PE but did show bilateral lower lobe airspace consolidations concerning for pneumonia or aspiration.  CT abdomen with some concern of questionable early uncomplicated diverticulitis, stable cholecystostomy tube with decompression of gallbladder. He was started on remdesivir, steroid and Zosyn.  Subjective: Patient is very hard of  hearing, no new complaint.  Continues to have cough. Denies any pain, nausea or vomiting. Continue to require 10 to 12 L of oxygen.  Assessment & Plan:   Principal Problem:   Pneumonia due to COVID-19 virus Active Problems:   COPD (chronic obstructive pulmonary disease) (HCC)   Acute on chronic respiratory failure (HCC)   Elevated troponin   Bladder mass  Acute on chronic hypoxemic respiratory failure and severe sepsis secondary to COVID-19 viral pneumonia and possible aspiration pneumonia: Continue to require more than his baseline oxygen of 4 to 5 L. Elevated inflammatory markers with procalcitonin at 1.69. CTA negative for PE.  Intermittently becoming hypotensive requiring small boluses.  Blood cultures remain negative. -Continue with remdesivir and steroid-day 1 -Small boluses as needed. -Continue with Zosyn -Supplemental oxygen to keep the saturation above 88% -Encourage proning as tolerated. -Continue with supportive care and supplement. -Continue to monitor inflammatory markers -Patient is high risk for deterioration and death due to advanced age and comorbidities.  Questionable early diverticulitis.  Denies any abdominal pain, no nausea or vomiting today.  CT abdomen concerning for early diverticulitis, patient with colostomy tube in place. -Continue with Zosyn  Elevated troponin.  Mildly elevated troponin which can be secondary to demand ischemia.  No chest pain.  EKG concerning for A. fib with a lot of artifacts.  Case discussed with Dr. Kalman Shan from cardiology by admitting provider and they also agreed that it is difficult to conclude whether it is safe A. fib but not. -Continue to monitor -Repeat EKG  Bladder mass: CT showing a mass  in the bladder concerning for bladder carcinoma which was seen on prior CTs from June 2021 in November 2021 as well.  According to CT today, this mass appears grossly similar in size to prior study.  Patient tells the admitting provider that he  is aware that he has bladder cancer and is seen by urologist Dr. Diona Fanti who discussed removing the mass but his cardiologist felt surgery was not safe likely due to his advanced age/medical comorbidities. -Outpatient urology follow-up  Microscopic hematuria: Likely due to bladder mass. -Outpatient urology follow-up  Hypokalemia/hypomagnesemia: Likely due to home diuretic use and poor oral intake in the setting of acute illness. -Replete potassium and magnesium. -Continue to monitor  COPD: Stable.  No signs of acute exacerbation at this time. -Continue home inhalers  Hyperlipidemia -Continue Zocor  Hypothyroidism -Continue Synthroid  Chronic diastolic heart failure: BNP 299.  However, chest CT not suggestive of pulmonary edema and clinically does not appear volume overloaded. -Hold diuretics at this time. - Continue to monitor volume status.  Pulmonary hypertension -Continue home macitentan, sildenafil  Hypotension.  Intermittently becoming hypotensive, responding to IV fluid. -Continue home midodrine -As needed small boluses.  Objective: Vitals:   11/14/20 1415 11/14/20 1430 11/14/20 1445 11/14/20 1500  BP: 96/60 (!) 93/56 (!) 100/59 (!) 100/59  Pulse: 68 76 78 60  Resp: 19 (!) 25 (!) 26 (!) 24  Temp:      TempSrc:      SpO2: 91% 92% 91% 92%  Weight:      Height:       No intake or output data in the 24 hours ending 11/14/20 1742 Filed Weights   11/14/20 0400  Weight: 69.7 kg    Examination:  General exam: Appears calm and comfortable  Respiratory system: Clear to auscultation. Respiratory effort normal. Cardiovascular system: S1 & S2 heard, RRR.  Gastrointestinal system: Soft, nontender, nondistended, bowel sounds positive. Central nervous system: Alert and oriented. No focal neurological deficits Extremities: No edema, no cyanosis, pulses intact and symmetrical. Psychiatry: Judgement and insight appear normal.    DVT prophylaxis: Lovenox Code  Status: DNR Family Communication: Updated the daughter on phone. Disposition Plan:  Status is: Inpatient  Remains inpatient appropriate because:Inpatient level of care appropriate due to severity of illness   Dispo: The patient is from: Home              Anticipated d/c is to: To be determined              Anticipated d/c date is: 2 days              Patient currently is not medically stable to d/c.   Difficult to place patient No   Consultants:   None  Procedures:  Antimicrobials:  Zosyn  Data Reviewed: I have personally reviewed following labs and imaging studies  CBC: Recent Labs  Lab 11/13/20 2234  WBC 14.6*  NEUTROABS 13.1*  HGB 14.3  HCT 43.3  MCV 94.3  PLT [id number]   Basic Metabolic Panel: Recent Labs  Lab 11/13/20 2234 11/14/20 0409 11/14/20 0848  NA 132*  --  132*  K 2.8*  --  3.2*  CL 91*  --  93*  CO2 23  --  24  GLUCOSE 95  --  87  BUN 26*  --  25*  CREATININE 1.16  --  1.23  CALCIUM 8.3*  --  7.8*  MG  --  1.6*  --    GFR: Estimated Creatinine Clearance: 33.6  mL/min (by C-G formula based on SCr of 1.23 mg/dL). Liver Function Tests: Recent Labs  Lab 11/13/20 2234 11/14/20 0848  AST 29 28  ALT 20 16  ALKPHOS 98 78  BILITOT 1.3* 1.5*  PROT 8.2* 7.0  ALBUMIN 3.4* 2.9*   No results for input(s): LIPASE, AMYLASE in the last 168 hours. No results for input(s): AMMONIA in the last 168 hours. Coagulation Profile: Recent Labs  Lab 11/13/20 2234  INR 1.1   Cardiac Enzymes: No results for input(s): CKTOTAL, CKMB, CKMBINDEX, TROPONINI in the last 168 hours. BNP (last 3 results) No results for input(s): PROBNP in the last 8760 hours. HbA1C: No results for input(s): HGBA1C in the last 72 hours. CBG: No results for input(s): GLUCAP in the last 168 hours. Lipid Profile: No results for input(s): CHOL, HDL, LDLCALC, TRIG, CHOLHDL, LDLDIRECT in the last 72 hours. Thyroid Function Tests: No results for input(s): TSH, T4TOTAL, FREET4, T3FREE,  THYROIDAB in the last 72 hours. Anemia Panel: Recent Labs    11/14/20 0409  FERRITIN 69   Sepsis Labs: Recent Labs  Lab 11/13/20 2234 11/13/20 2335 11/14/20 0409  PROCALCITON  --   --  1.69  LATICACIDVEN 1.9 1.6  --     Recent Results (from the past 240 hour(s))  Culture, blood (Routine x 2)     Status: None (Preliminary result)   Collection Time: 11/13/20 10:34 PM   Specimen: BLOOD  Result Value Ref Range Status   Specimen Description BLOOD SITE NOT SPECIFIED  Final   Special Requests   Final    BOTTLES DRAWN AEROBIC AND ANAEROBIC Blood Culture adequate volume   Culture   Final    NO GROWTH < 24 HOURS Performed at [facility], [place] [address] [place], [place] [address]    Report Status PENDING  Incomplete  SARS Coronavirus 2 by RT PCR (hospital order, performed in [facility] hospital lab) Nasopharyngeal Nasopharyngeal Swab     Status: Abnormal   Collection Time: 11/13/20 11:27 PM   Specimen: Nasopharyngeal Swab  Result Value Ref Range Status   SARS Coronavirus 2 POSITIVE (A) NEGATIVE Final    Comment: RESULT CALLED TO, READ BACK BY AND VERIFIED WITH: RN DAVIDSON ALEXIS BY MESSA H. AT [id number] ON 11/14/2020 (NOTE) SARS-CoV-2 target nucleic acids are DETECTED  SARS-CoV-2 RNA is generally detectable in upper respiratory specimens  during the acute phase of infection.  Positive results are indicative  of the presence of the identified virus, but do not rule out bacterial infection or co-infection with other pathogens not detected by the test.  Clinical correlation with patient history and  other diagnostic information is necessary to determine patient infection status.  The expected result is negative.  Fact Sheet for Patients:   StrictlyIdeas.no   Fact Sheet for Healthcare Providers:   BankingDealers.co.za    This test is not yet approved or cleared by the [place] FDA and  has been authorized for detection  and/or diagnosis of SARS-CoV-2 by FDA under an Emergency Use Authorization (EUA).  This EUA will remain in effect  (meaning this test can be used) for the duration of  the COVID-19 declaration under Section 564(b)(1) of the Act, 21 U.S.C. section 360-bbb-3(b)(1), unless the authorization is terminated or revoked sooner.  Performed at [facility], [place] [address] [place], [place] [address]      Radiology Studies: CT ANGIO CHEST PE W OR WO CONTRAST  Result Date: 11/14/2020 CLINICAL DATA:  Shortness of breath.  COPD.  Sepsis. EXAM: CT ANGIOGRAPHY CHEST CT ABDOMEN AND PELVIS WITH CONTRAST TECHNIQUE: Multidetector CT imaging of the chest was performed using the standard protocol during bolus administration of intravenous contrast. Multiplanar CT image reconstructions and MIPs were obtained to evaluate the vascular anatomy. Multidetector CT imaging of the abdomen and pelvis was performed using the standard protocol during bolus administration of intravenous contrast. CONTRAST:  155mL OMNIPAQUE IOHEXOL 350 MG/ML SOLN COMPARISON:  CT dated September 08, 2020. FINDINGS: CTA CHEST FINDINGS Cardiovascular: Evaluation is limited by respiratory motion artifact. Within that limitation, there is no central pulmonary embolus. The size of the main pulmonary artery is normal. Cardiomegaly with coronary artery calcification. Atherosclerotic changes of the thoracic aorta. There is an ascending thoracic aortic aneurysm measuring 4.5 cm. Atherosclerotic changes are noted. Mediastinum/Nodes: -- No mediastinal lymphadenopathy. -- No hilar lymphadenopathy. -- No axillary lymphadenopathy. -- No supraclavicular lymphadenopathy. -- Normal thyroid gland where visualized. -  Unremarkable esophagus. Lungs/Pleura: There is airspace consolidation at the lung bases. There is bronchial wall thickening and areas of mucous plugging primarily in the lower lobes. Emphysematous changes are noted. There is no pneumothorax.  Musculoskeletal: No chest wall abnormality. No bony spinal canal stenosis. Review of the MIP images confirms the above findings. CT ABDOMEN and PELVIS FINDINGS Hepatobiliary: The liver is cirrhotic in appearance. There is a cholecystostomy tube in place. The gallbladder is decompressed. The tube appears grossly well position.There is no biliary ductal dilation. Pancreas: Normal contours without ductal dilatation. No peripancreatic fluid collection. Spleen: Unremarkable. Adrenals/Urinary Tract: --Adrenal glands: Unremarkable. --Right kidney/ureter: No hydronephrosis or radiopaque kidney stones. --Left kidney/ureter: No hydronephrosis or radiopaque kidney stones. --Urinary bladder: Again noted is an enhancing mass along the right lateral wall of the urinary bladder. This mass appears grossly similar in size to prior study. Stomach/Bowel: --Stomach/Duodenum: No hiatal hernia or other gastric abnormality. Normal duodenal course and caliber. --Small bowel: Unremarkable. --Colon: There is diffuse sigmoid diverticulosis with questionable early uncomplicated diverticulitis involving the sigmoid colon (axial series 5, image 51). --Appendix: Not visualized. No right lower quadrant inflammation or free fluid. Vascular/Lymphatic: The patient is status post prior EVAR. The excluded aneurysm sac is essentially stable from prior study. --No retroperitoneal lymphadenopathy. --No mesenteric lymphadenopathy. --No pelvic or inguinal lymphadenopathy. Reproductive: Unremarkable Other: No ascites or free air. The abdominal wall is normal. Musculoskeletal. No acute displaced fractures. Review of the MIP images confirms the above findings. IMPRESSION: 1. Evaluation is limited by respiratory motion artifact. Within that limitation, there is no central pulmonary embolus. 2. Bilateral lower lobe airspace consolidation, concerning for pneumonia or aspiration. 3. Diffuse sigmoid diverticulosis with questionable early uncomplicated  diverticulitis involving the sigmoid colon. 4. Persistent enhancing mass along the right lateral wall of the urinary bladder, concerning for bladder carcinoma. 5. Cirrhosis. 6. Stable abdominal and thoracic aortic aneurysms as detailed above. Please see prior reports for follow-up recommendations. 7. Stable cholecystostomy tube with decompression of the gallbladder. Aortic Atherosclerosis (ICD10-I70.0) and Emphysema (ICD10-J43.9). Electronically Signed   By: Constance Holster M.D.   On: 11/14/2020 01:04   CT ABDOMEN PELVIS W CONTRAST  Result Date: 11/14/2020 CLINICAL DATA:  Shortness of breath.  COPD.  Sepsis. EXAM: CT ANGIOGRAPHY CHEST CT ABDOMEN AND PELVIS WITH CONTRAST TECHNIQUE: Multidetector CT imaging of the chest was performed using the standard protocol during bolus administration of intravenous contrast. Multiplanar CT image reconstructions and MIPs were obtained to evaluate the vascular anatomy. Multidetector CT imaging of the abdomen and pelvis was performed using the standard protocol during bolus administration  of intravenous contrast. CONTRAST:  162mL OMNIPAQUE IOHEXOL 350 MG/ML SOLN COMPARISON:  CT dated September 08, 2020. FINDINGS: CTA CHEST FINDINGS Cardiovascular: Evaluation is limited by respiratory motion artifact. Within that limitation, there is no central pulmonary embolus. The size of the main pulmonary artery is normal. Cardiomegaly with coronary artery calcification. Atherosclerotic changes of the thoracic aorta. There is an ascending thoracic aortic aneurysm measuring 4.5 cm. Atherosclerotic changes are noted. Mediastinum/Nodes: -- No mediastinal lymphadenopathy. -- No hilar lymphadenopathy. -- No axillary lymphadenopathy. -- No supraclavicular lymphadenopathy. -- Normal thyroid gland where visualized. -  Unremarkable esophagus. Lungs/Pleura: There is airspace consolidation at the lung bases. There is bronchial wall thickening and areas of mucous plugging primarily in the lower lobes.  Emphysematous changes are noted. There is no pneumothorax. Musculoskeletal: No chest wall abnormality. No bony spinal canal stenosis. Review of the MIP images confirms the above findings. CT ABDOMEN and PELVIS FINDINGS Hepatobiliary: The liver is cirrhotic in appearance. There is a cholecystostomy tube in place. The gallbladder is decompressed. The tube appears grossly well position.There is no biliary ductal dilation. Pancreas: Normal contours without ductal dilatation. No peripancreatic fluid collection. Spleen: Unremarkable. Adrenals/Urinary Tract: --Adrenal glands: Unremarkable. --Right kidney/ureter: No hydronephrosis or radiopaque kidney stones. --Left kidney/ureter: No hydronephrosis or radiopaque kidney stones. --Urinary bladder: Again noted is an enhancing mass along the right lateral wall of the urinary bladder. This mass appears grossly similar in size to prior study. Stomach/Bowel: --Stomach/Duodenum: No hiatal hernia or other gastric abnormality. Normal duodenal course and caliber. --Small bowel: Unremarkable. --Colon: There is diffuse sigmoid diverticulosis with questionable early uncomplicated diverticulitis involving the sigmoid colon (axial series 5, image 51). --Appendix: Not visualized. No right lower quadrant inflammation or free fluid. Vascular/Lymphatic: The patient is status post prior EVAR. The excluded aneurysm sac is essentially stable from prior study. --No retroperitoneal lymphadenopathy. --No mesenteric lymphadenopathy. --No pelvic or inguinal lymphadenopathy. Reproductive: Unremarkable Other: No ascites or free air. The abdominal wall is normal. Musculoskeletal. No acute displaced fractures. Review of the MIP images confirms the above findings. IMPRESSION: 1. Evaluation is limited by respiratory motion artifact. Within that limitation, there is no central pulmonary embolus. 2. Bilateral lower lobe airspace consolidation, concerning for pneumonia or aspiration. 3. Diffuse sigmoid  diverticulosis with questionable early uncomplicated diverticulitis involving the sigmoid colon. 4. Persistent enhancing mass along the right lateral wall of the urinary bladder, concerning for bladder carcinoma. 5. Cirrhosis. 6. Stable abdominal and thoracic aortic aneurysms as detailed above. Please see prior reports for follow-up recommendations. 7. Stable cholecystostomy tube with decompression of the gallbladder. Aortic Atherosclerosis (ICD10-I70.0) and Emphysema (ICD10-J43.9). Electronically Signed   By: Constance Holster M.D.   On: 11/14/2020 01:04   DG Chest Port 1 View  Result Date: 11/13/2020 CLINICAL DATA:  Shortness of breath and decreased oxygen saturation. COPD with chronic oxygen use. EXAM: PORTABLE CHEST 1 VIEW COMPARISON:  09/18/2020 FINDINGS: Postoperative changes in the mediastinum. Cardiac enlargement. Pulmonary vascular congestion with diffuse interstitial pattern likely edema but pneumonia could also have this appearance. Changes are more prominent than on the previous study. No definite pleural effusion. No pneumothorax. Mediastinal contours appear intact. Calcification of the aorta. IMPRESSION: Cardiac enlargement with pulmonary vascular congestion and interstitial edema. Electronically Signed   By: Lucienne Capers M.D.   On: 11/13/2020 22:26    Scheduled Meds: . vitamin C  500 mg Oral Daily  . cholecalciferol  1,000 Units Oral Daily  . enoxaparin (LOVENOX) injection  40 mg Subcutaneous Q24H  . levothyroxine  100 mcg  Oral Q0600  . macitentan  10 mg Oral Daily  . methylPREDNISolone (SOLU-MEDROL) injection  0.5 mg/kg Intravenous Q12H   Followed by  . [START ON 11/17/2020] predniSONE  50 mg Oral Daily  . midodrine  15 mg Oral TID PC  . mometasone-formoterol  2 puff Inhalation BID  . montelukast  10 mg Oral QHS  . pantoprazole  20 mg Oral BID  . sildenafil  40 mg Oral TID  . simvastatin  10 mg Oral QHS  . zinc sulfate  220 mg Oral Daily   Continuous Infusions: .  piperacillin-tazobactam (ZOSYN)  IV 3.375 g (11/14/20 1426)  . [START ON 11/15/2020] remdesivir 100 mg in NS 100 mL       LOS: 0 days   Time spent:   Lorella Nimrod, MD [facility]  If 7PM-7AM, please contact night-coverage [email or web address]  11/14/2020, 5:42 PM   This record has been created using Systems analyst. Errors have been sought and corrected,but may not always be located. Such creation errors do not reflect on the standard of care.

## 2020-11-14 NOTE — ED Notes (Signed)
Updated daughter on patient

## 2020-11-14 NOTE — Progress Notes (Signed)
Pt being followed by [facility] for sepsis protocol. 

## 2020-11-14 NOTE — ED Notes (Addendum)
Pt started to become hypotensive however asymptomatic  this RN notified MD per MD will order 1L Bolus of NS

## 2020-11-15 ENCOUNTER — Other Ambulatory Visit: Payer: Self-pay

## 2020-11-15 ENCOUNTER — Encounter (HOSPITAL_COMMUNITY): Payer: Self-pay | Admitting: Internal Medicine

## 2020-11-15 DIAGNOSIS — J1282 Pneumonia due to coronavirus disease 2019: Secondary | ICD-10-CM

## 2020-11-15 DIAGNOSIS — U071 COVID-19: Secondary | ICD-10-CM

## 2020-11-15 LAB — CBC WITH DIFFERENTIAL/PLATELET
Abs Immature Granulocytes: 0.04 10*3/uL (ref 0.00–0.07)
Basophils Absolute: 0 10*3/uL (ref 0.0–0.1)
Basophils Relative: 0 %
Eosinophils Absolute: 0 10*3/uL (ref 0.0–0.5)
Eosinophils Relative: 0 %
HCT: 30.9 % — ABNORMAL LOW (ref 39.0–52.0)
Hemoglobin: 10.2 g/dL — ABNORMAL LOW (ref 13.0–17.0)
Immature Granulocytes: 0 %
Lymphocytes Relative: 7 %
Lymphs Abs: 0.8 10*3/uL (ref 0.7–4.0)
MCH: 31.8 pg (ref 26.0–34.0)
MCHC: 33 g/dL (ref 30.0–36.0)
MCV: 96.3 fL (ref 80.0–100.0)
Monocytes Absolute: 0.2 10*3/uL (ref 0.1–1.0)
Monocytes Relative: 2 %
Neutro Abs: 9.3 10*3/uL — ABNORMAL HIGH (ref 1.7–7.7)
Neutrophils Relative %: 91 %
Platelets: 239 10*3/uL (ref 150–400)
RBC: 3.21 MIL/uL — ABNORMAL LOW (ref 4.22–5.81)
RDW: 14.8 % (ref 11.5–15.5)
WBC: 10.4 10*3/uL (ref 4.0–10.5)
nRBC: 0 % (ref 0.0–0.2)

## 2020-11-15 LAB — COMPREHENSIVE METABOLIC PANEL
ALT: 14 U/L (ref 0–44)
AST: 20 U/L (ref 15–41)
Albumin: 2.4 g/dL — ABNORMAL LOW (ref 3.5–5.0)
Alkaline Phosphatase: 59 U/L (ref 38–126)
Anion gap: 12 (ref 5–15)
BUN: 28 mg/dL — ABNORMAL HIGH (ref 8–23)
CO2: 21 mmol/L — ABNORMAL LOW (ref 22–32)
Calcium: 8 mg/dL — ABNORMAL LOW (ref 8.9–10.3)
Chloride: 98 mmol/L (ref 98–111)
Creatinine, Ser: 1.12 mg/dL (ref 0.61–1.24)
GFR, Estimated: >60 mL/min (ref >60)
Glucose, Bld: 148 mg/dL — ABNORMAL HIGH (ref 70–99)
Potassium: 3.2 mmol/L — ABNORMAL LOW (ref 3.5–5.1)
Sodium: 131 mmol/L — ABNORMAL LOW (ref 135–145)
Total Bilirubin: 1 mg/dL (ref 0.3–1.2)
Total Protein: 6 g/dL — ABNORMAL LOW (ref 6.5–8.1)

## 2020-11-15 LAB — C-REACTIVE PROTEIN: CRP: 11.4 mg/dL — ABNORMAL HIGH (ref <1.0)

## 2020-11-15 LAB — D-DIMER, QUANTITATIVE: D-Dimer, Quant: 1.79 ug/mL-FEU — ABNORMAL HIGH (ref 0.00–0.50)

## 2020-11-15 MED ORDER — CHLORHEXIDINE GLUCONATE CLOTH 2 % EX PADS
6.0000 | MEDICATED_PAD | Freq: Every day | CUTANEOUS | Status: DC
  Administered 2020-11-16 – 2020-11-18 (×3): 6 via TOPICAL

## 2020-11-15 MED ORDER — MAGNESIUM SULFATE 2 GM/50ML IV SOLN
2.0000 g | Freq: Once | INTRAVENOUS | Status: AC
  Administered 2020-11-15: 2 g via INTRAVENOUS
  Filled 2020-11-15: qty 50

## 2020-11-15 MED ORDER — POTASSIUM CHLORIDE CRYS ER 20 MEQ PO TBCR
40.0000 meq | EXTENDED_RELEASE_TABLET | Freq: Once | ORAL | Status: AC
  Administered 2020-11-15: 40 meq via ORAL
  Filled 2020-11-15: qty 2

## 2020-11-15 NOTE — Evaluation (Signed)
Occupational Therapy Evaluation Patient Details Name: Vincent Robinson MRN: [id number] DOB: 1923/07/08 Today's Date: 11/15/2020    History of Present Illness 87 y.o. male with medical history significant of asthma, COPD, chronic respiratory failure on home oxygen, CAD, hyperlipidemia, hypothyroidism, chronic diastolic heart failure, pulmonary hypertension, hospital admission in November 2021 for septic shock secondary to acute cholecystitis status post percutaneous gallbladder drain placement presenting to the ED via EMS for evaluation of shortness of breath.  EMS reported oxygen saturation of 70% on 5 L home oxygen and his fingers were blue.  He was found to be positive for COVID-19.  Leukocytosis, hypokalemia, BNP 299, chest x-ray with cardiac enlargement and pulmonary vascular congestion.  CTA was negative for PE but did show bilateral lower lobe airspace consolidations concerning for pneumonia or aspiration.   Clinical Impression   PTA, pt was staying at his daughters since his last hospital admission. Pt performing LB ADLs with Min-Mod A and functional mobility with Min A. Pt currently presenting with decreased balance, strength, and activity tolerance. Pt very motivated to participate in therapy. Pt would benefit from further acute OT to facilitate safe dc. Recommend dc to home with [facility] for further OT to optimize safety, independence with ADLs, and return to PLOF.   On 10L upon arrival at 100%. During activity, SpO2 98-96% on 6-5L. Notified RN    Follow Up Recommendations  Home health OT;Supervision/Assistance - 24 hour (Unless family is unable to provide support with him being COVID+; may need SNF)    Equipment Recommendations  None recommended by OT    Recommendations for Other Services PT consult     Precautions / Restrictions Precautions Precautions: Fall Precaution Comments: COVID; watch Spo2      Mobility Bed Mobility Overal bed mobility: Needs Assistance Bed Mobility:  Supine to Sit     Supine to sit: Supervision;HOB elevated     General bed mobility comments: Supervision for safety    Transfers Overall transfer level: Needs assistance Equipment used: 1 person hand held assist Transfers: Sit to/from Stand;Stand Pivot Transfers Sit to Stand: Min assist Stand pivot transfers: Min assist       General transfer comment: Min A for balance    Balance Overall balance assessment: Needs assistance Sitting-balance support: No upper extremity supported;Feet supported Sitting balance-Leahy Scale: Good     Standing balance support: No upper extremity supported;Single extremity supported;During functional activity Standing balance-Leahy Scale: Fair                             ADL either performed or assessed with clinical judgement   ADL Overall ADL's : Needs assistance/impaired Eating/Feeding: Set up;Sitting   Grooming: Set up;Sitting   Upper Body Bathing: Supervision/ safety;Set up;Sitting   Lower Body Bathing: Minimal assistance;Sit to/from stand Lower Body Bathing Details (indicate cue type and reason): Min A for standing balance Upper Body Dressing : Supervision/safety;Set up;Sitting   Lower Body Dressing: Maximal assistance;Sit to/from stand Lower Body Dressing Details (indicate cue type and reason): Max A to don socks Toilet Transfer: Minimal assistance;Stand-pivot (simulated to recliner)           Functional mobility during ADLs: Minimal assistance General ADL Comments: Poor balance, strength, and activity tolerance     Vision         Perception     Praxis      Pertinent Vitals/Pain Pain Assessment: No/denies pain     Hand Dominance Right   Extremity/Trunk Assessment  Upper Extremity Assessment Upper Extremity Assessment: Overall WFL for tasks assessed   Lower Extremity Assessment Lower Extremity Assessment: Defer to PT evaluation   Cervical / Trunk Assessment Cervical / Trunk Assessment: Kyphotic    Communication Communication Communication: No difficulties   Cognition Arousal/Alertness: Awake/alert Behavior During Therapy: WFL for tasks assessed/performed Overall Cognitive Status: Within Functional Limits for tasks assessed                                 General Comments: Very agreeable to therapy   General Comments  On 10L upon arrival at 100%. Pt reports he normally wears 2-3L at home and stats 95-87%. During activity, SpO2 98-96% on 6-5L    Exercises     Shoulder Instructions      Home Living Family/patient expects to be discharged to:: Private residence Living Arrangements: Alone Available Help at Discharge: Family;Available 24 hours/day Type of Home: House Home Access: Level entry     Home Layout: One level     Bathroom Shower/Tub: Walk-in shower;Tub/shower unit   Bathroom Toilet: Standard Bathroom Accessibility: Yes   Home Equipment: Walker - 4 wheels   Additional Comments: Pt reports he can dc to daughter home and stay with her. 2-3 L home O2. Pt's wife is at [facility] for increased care due to cancer.      Prior Functioning/Environment Level of Independence: Independent with assistive device(s)        Comments: Used a RW after last hospital stay, but reports he typically does not use DME. Reports he performs ADLs, IADLs, and drives. "I am an active person"        OT Problem List: Decreased strength;Decreased range of motion;Decreased activity tolerance;Impaired balance (sitting and/or standing);Decreased knowledge of precautions;Decreased knowledge of use of DME or AE;Decreased safety awareness      OT Treatment/Interventions: Self-care/ADL training;Therapeutic exercise;Energy conservation;DME and/or AE instruction;Therapeutic activities;Patient/family education    OT Goals(Current goals can be found in the care plan section) Acute Rehab OT Goals Patient Stated Goal: Go home OT Goal Formulation: With patient Time For Goal  Achievement: 11/29/20 Potential to Achieve Goals: Good  OT Frequency: Min 2X/week   Barriers to D/C:            Co-evaluation              AM-PAC OT "6 Clicks" Daily Activity     Outcome Measure Help from another person eating meals?: None Help from another person taking care of personal grooming?: A Little Help from another person toileting, which includes using toliet, bedpan, or urinal?: A Little Help from another person bathing (including washing, rinsing, drying)?: A Little Help from another person to put on and taking off regular upper body clothing?: A Little Help from another person to put on and taking off regular lower body clothing?: A Lot 6 Click Score: 18   End of Session Equipment Utilized During Treatment: Oxygen (5-6L) Nurse Communication: Mobility status  Activity Tolerance: Patient tolerated treatment well Patient left: in chair;with call bell/phone within reach;with chair alarm set  OT Visit Diagnosis: Unsteadiness on feet (R26.81);Other abnormalities of gait and mobility (R26.89);Muscle weakness (generalized) (M62.81)                Time: 5462-7035 OT Time Calculation (min): 43 min Charges:  OT General Charges $OT Visit: 1 Visit OT Evaluation $OT Eval Moderate Complexity: 1 Mod OT Treatments $Self Care/Home Management : 23-37 mins  Aftyn Nott  Champagne Paletta MSOT, OTR/L Acute Rehab Pager: [phone] Office: [place] 11/15/2020, 10:56 AM

## 2020-11-15 NOTE — Progress Notes (Signed)
OT Cancellation Note  Patient Details Name: Vincent Robinson MRN: [id number] DOB: 07/27/1923   Cancelled Treatment:    Reason Eval/Treat Not Completed: Other (comment) (Just started breakfast. Will return as schedule allows.)  [place], OTR/L Acute Rehab Pager: [phone] Office: [phone] 11/15/2020, 8:02 AM

## 2020-11-15 NOTE — Progress Notes (Signed)
PROGRESS NOTE    Vincent Robinson  HKV:[id number] DOB: 1923-07-28 DOA: 11/13/2020 PCP: Katherina Mires, MD   Brief Narrative: 87 year old with past medical history significant for asthma, COPD, chronic respiratory failure on home oxygen, CAD, hyperlipidemia, hypothyroidism, chronic diastolic heart failure, pulmonary hypertension, hospital admission November 2021 for septic shock secondary to acute cholecystitis status post percutaneous gallbladder drain placement presented to the ED for evaluation of shortness of breath. EMS reported oxygen saturation 70% on 5 L home oxygen.  Patient was tachypneic and labored breathing.  Patient was placed on nonrebreather with improvement of his oxygen saturation. See if 2 doses of Covid vaccine, he did not receive a booster shot yet.  He has a history of bladder cancer for which he is seen by Dr. Diona Fanti who discussed removing the tumor but his cardiology felt that the surgery was not safe for him. Patient was found to be positive for COVID-19.  Chest x-ray with pulmonary vascular congestion.  CTA was negative for PE but did show bilateral lower lobe airspace consolidation concerning for pneumonia and aspiration.  CT abdomen with some concern of questionable early uncomplicated diverticulitis, a stable cholecystostomy tube with decompression of gallbladder.  Patient was a started on Remdesivir , a steroid and Zosyn.    Assessment & Plan:   Principal Problem:   Pneumonia due to COVID-19 virus Active Problems:   COPD (chronic obstructive pulmonary disease) (HCC)   Acute on chronic respiratory failure (HCC)   Elevated troponin   Bladder mass   1-Acute on chronic hypoxic respiratory failure and sepsis secondary to COVID-19 viral pneumonia, possible aspiration pneumonia: Chronically on 4 to 5 L of oxygen at home. CTA negative for PE. Continue with remdesivir and IVF steroid day 2. Continue with Zosyn. Position as tolerated. COVID-19 Labs  Recent Labs     11/14/20 0409 11/15/20 0621  DDIMER 2.15* 1.79*  FERRITIN 69  --   LDH 166  --   CRP 4.8* 11.4*    Lab Results  Component Value Date   SARSCOV2NAA POSITIVE (A) 11/13/2020   [place] NEGATIVE 09/08/2020   [place] NEGATIVE 03/22/2020   [place] NEGATIVE 12/18/2019    2-questionable early diverticulitis: CT abdomen concerning with early diverticulitis: Continue with IV Zosyn  History of cholecystitis, status post percutaneous cholecystostomy tube; Continue to drain in place 1/18. Needs drain exchange in 3 to 6 weeks.  Fail capping trial. IR recommend to keep drain to gravity.   Elevated troponin: Could be related to demand ischemia. There was some concern about A. fib versus artifact reviewed by cardiology on admission.  Plan to continue to monitor  Bladder mass: Concerning for bladder carcinoma. Thought to be not a candidate for surgery. Follow with Dr. Diona Fanti  Hypokalemia: Hypomagnesemia: Replete  COPD: Stable continue with home inhaler  Hypothyroidism: Continue with Synthroid  Chronic diastolic heart failure: Hold diuretics  Pulmonary  hypertension: Continue with macitentan and sildenafil  Hypotension: Intermittently respond to IV fluids On midodrine at home, continue.   Bradycardia; asymptomatic.  Hypomagnesemia; replete IV>  Pressure Injury 09/08/20 Buttocks Left Stage 2 -  Partial thickness loss of dermis presenting as a shallow open injury with a red, pink wound bed without slough. (Active)  09/08/20 1000  Location: Buttocks  Location Orientation: Left  Staging: Stage 2 -  Partial thickness loss of dermis presenting as a shallow open injury with a red, pink wound bed without slough.  Wound Description (Comments):   Present on Admission: Yes     Estimated body  mass index is 23.67 kg/m as calculated from the following:   Height as of this encounter: 5\' 7"  (1.702 m).   Weight as of this encounter: 68.5 kg.   DVT prophylaxis:  lovenox Code Status: DNR Family Communication: daughter over phone.  Disposition Plan:  Status is: Inpatient  Remains inpatient appropriate because:IV treatments appropriate due to intensity of illness or inability to take PO   Dispo: The patient is from: Home              Anticipated d/c is to: SNF              Anticipated d/c date is: 3 days              Patient currently is not medically stable to d/c.   Difficult to place patient No        Consultants:   none  Procedures:   none  Antimicrobials:  Zosyn   Subjective: He report improvement of diarrhea.  He denies abdominal pain. Breathing better.   Objective: Vitals:   11/14/20 2358 11/15/20 0033 11/15/20 0459 11/15/20 0500  BP: 98/65  (!) 95/55   Pulse: (!) 54  (!) 51   Resp: (!) 21  18   Temp: 97.6 F (36.4 C)  97.8 F (36.6 C)   TempSrc: Oral  Oral   SpO2: 100%     Weight:  68.5 kg  68.5 kg  Height:        Intake/Output Summary (Last 24 hours) at 11/15/2020 0724 Last data filed at 11/15/2020 0600 Gross per 24 hour  Intake 251.24 ml  Output 100 ml  Net 151.24 ml   Filed Weights   11/14/20 0400 11/15/20 0033 11/15/20 0500  Weight: 69.7 kg 68.5 kg 68.5 kg    Examination:  General exam: Appears calm and comfortable  Respiratory system: B/L crackle.  Cardiovascular system: S1 & S2 heard, RRR. No JVD, murmurs, rubs, gallops or clicks. No pedal edema. Gastrointestinal system: Abdomen is nondistended, soft and nontender. No organomegaly or masses felt. Normal bowel sounds heard. Central nervous system: Alert and oriented. No focal neurological deficits. Extremities: Symmetric 5 x 5 power.    Data Reviewed: I have personally reviewed following labs and imaging studies  CBC: Recent Labs  Lab 11/13/20 2234  WBC 14.6*  NEUTROABS 13.1*  HGB 14.3  HCT 43.3  MCV 94.3  PLT [id number]   Basic Metabolic Panel: Recent Labs  Lab 11/13/20 2234 11/14/20 0409 11/14/20 0848  NA 132*  --  132*  K 2.8*   --  3.2*  CL 91*  --  93*  CO2 23  --  24  GLUCOSE 95  --  87  BUN 26*  --  25*  CREATININE 1.16  --  1.23  CALCIUM 8.3*  --  7.8*  MG  --  1.6*  --    GFR: Estimated Creatinine Clearance: 33.6 mL/min (by C-G formula based on SCr of 1.23 mg/dL). Liver Function Tests: Recent Labs  Lab 11/13/20 2234 11/14/20 0848  AST 29 28  ALT 20 16  ALKPHOS 98 78  BILITOT 1.3* 1.5*  PROT 8.2* 7.0  ALBUMIN 3.4* 2.9*   No results for input(s): LIPASE, AMYLASE in the last 168 hours. No results for input(s): AMMONIA in the last 168 hours. Coagulation Profile: Recent Labs  Lab 11/13/20 2234  INR 1.1   Cardiac Enzymes: No results for input(s): CKTOTAL, CKMB, CKMBINDEX, TROPONINI in the last 168 hours. BNP (last 3 results) No results  for input(s): PROBNP in the last 8760 hours. HbA1C: No results for input(s): HGBA1C in the last 72 hours. CBG: No results for input(s): GLUCAP in the last 168 hours. Lipid Profile: No results for input(s): CHOL, HDL, LDLCALC, TRIG, CHOLHDL, LDLDIRECT in the last 72 hours. Thyroid Function Tests: No results for input(s): TSH, T4TOTAL, FREET4, T3FREE, THYROIDAB in the last 72 hours. Anemia Panel: Recent Labs    11/14/20 0409  FERRITIN 69   Sepsis Labs: Recent Labs  Lab 11/13/20 2234 11/13/20 2335 11/14/20 0409  PROCALCITON  --   --  1.69  LATICACIDVEN 1.9 1.6  --     Recent Results (from the past 240 hour(s))  Culture, blood (Routine x 2)     Status: None (Preliminary result)   Collection Time: 11/13/20 10:34 PM   Specimen: BLOOD  Result Value Ref Range Status   Specimen Description BLOOD SITE NOT SPECIFIED  Final   Special Requests   Final    BOTTLES DRAWN AEROBIC AND ANAEROBIC Blood Culture adequate volume   Culture   Final    NO GROWTH < 24 HOURS Performed at [facility], [place] [address], [place], [place] [address]    Report Status PENDING  Incomplete  SARS Coronavirus 2 by RT PCR (hospital order, performed in [facility] hospital  lab) Nasopharyngeal Nasopharyngeal Swab     Status: Abnormal   Collection Time: 11/13/20 11:27 PM   Specimen: Nasopharyngeal Swab  Result Value Ref Range Status   SARS Coronavirus 2 POSITIVE (A) NEGATIVE Final    Comment: RESULT CALLED TO, READ BACK BY AND VERIFIED WITH: RN DAVIDSON ALEXIS BY MESSA H. AT [id number] ON 11/14/2020 (NOTE) SARS-CoV-2 target nucleic acids are DETECTED  SARS-CoV-2 RNA is generally detectable in upper respiratory specimens  during the acute phase of infection.  Positive results are indicative  of the presence of the identified virus, but do not rule out bacterial infection or co-infection with other pathogens not detected by the test.  Clinical correlation with patient history and  other diagnostic information is necessary to determine patient infection status.  The expected result is negative.  Fact Sheet for Patients:   StrictlyIdeas.no   Fact Sheet for Healthcare Providers:   BankingDealers.co.za    This test is not yet approved or cleared by the [place] FDA and  has been authorized for detection and/or diagnosis of SARS-CoV-2 by FDA under an Emergency Use Authorization (EUA).  This EUA will remain in effect  (meaning this test can be used) for the duration of  the COVID-19 declaration under Section 564(b)(1) of the Act, 21 U.S.C. section 360-bbb-3(b)(1), unless the authorization is terminated or revoked sooner.  Performed at [facility], [place] [address] [place], [place] [address]          Radiology Studies: CT ANGIO CHEST PE W OR WO CONTRAST  Result Date: 11/14/2020 CLINICAL DATA:  Shortness of breath.  COPD.  Sepsis. EXAM: CT ANGIOGRAPHY CHEST CT ABDOMEN AND PELVIS WITH CONTRAST TECHNIQUE: Multidetector CT imaging of the chest was performed using the standard protocol during bolus administration of intravenous contrast. Multiplanar CT image reconstructions and MIPs were obtained to evaluate  the vascular anatomy. Multidetector CT imaging of the abdomen and pelvis was performed using the standard protocol during bolus administration of intravenous contrast. CONTRAST:  142mL OMNIPAQUE IOHEXOL 350 MG/ML SOLN COMPARISON:  CT dated September 08, 2020. FINDINGS: CTA CHEST FINDINGS Cardiovascular: Evaluation is limited by respiratory motion artifact. Within that limitation, there is no  central pulmonary embolus. The size of the main pulmonary artery is normal. Cardiomegaly with coronary artery calcification. Atherosclerotic changes of the thoracic aorta. There is an ascending thoracic aortic aneurysm measuring 4.5 cm. Atherosclerotic changes are noted. Mediastinum/Nodes: -- No mediastinal lymphadenopathy. -- No hilar lymphadenopathy. -- No axillary lymphadenopathy. -- No supraclavicular lymphadenopathy. -- Normal thyroid gland where visualized. -  Unremarkable esophagus. Lungs/Pleura: There is airspace consolidation at the lung bases. There is bronchial wall thickening and areas of mucous plugging primarily in the lower lobes. Emphysematous changes are noted. There is no pneumothorax. Musculoskeletal: No chest wall abnormality. No bony spinal canal stenosis. Review of the MIP images confirms the above findings. CT ABDOMEN and PELVIS FINDINGS Hepatobiliary: The liver is cirrhotic in appearance. There is a cholecystostomy tube in place. The gallbladder is decompressed. The tube appears grossly well position.There is no biliary ductal dilation. Pancreas: Normal contours without ductal dilatation. No peripancreatic fluid collection. Spleen: Unremarkable. Adrenals/Urinary Tract: --Adrenal glands: Unremarkable. --Right kidney/ureter: No hydronephrosis or radiopaque kidney stones. --Left kidney/ureter: No hydronephrosis or radiopaque kidney stones. --Urinary bladder: Again noted is an enhancing mass along the right lateral wall of the urinary bladder. This mass appears grossly similar in size to prior study.  Stomach/Bowel: --Stomach/Duodenum: No hiatal hernia or other gastric abnormality. Normal duodenal course and caliber. --Small bowel: Unremarkable. --Colon: There is diffuse sigmoid diverticulosis with questionable early uncomplicated diverticulitis involving the sigmoid colon (axial series 5, image 51). --Appendix: Not visualized. No right lower quadrant inflammation or free fluid. Vascular/Lymphatic: The patient is status post prior EVAR. The excluded aneurysm sac is essentially stable from prior study. --No retroperitoneal lymphadenopathy. --No mesenteric lymphadenopathy. --No pelvic or inguinal lymphadenopathy. Reproductive: Unremarkable Other: No ascites or free air. The abdominal wall is normal. Musculoskeletal. No acute displaced fractures. Review of the MIP images confirms the above findings. IMPRESSION: 1. Evaluation is limited by respiratory motion artifact. Within that limitation, there is no central pulmonary embolus. 2. Bilateral lower lobe airspace consolidation, concerning for pneumonia or aspiration. 3. Diffuse sigmoid diverticulosis with questionable early uncomplicated diverticulitis involving the sigmoid colon. 4. Persistent enhancing mass along the right lateral wall of the urinary bladder, concerning for bladder carcinoma. 5. Cirrhosis. 6. Stable abdominal and thoracic aortic aneurysms as detailed above. Please see prior reports for follow-up recommendations. 7. Stable cholecystostomy tube with decompression of the gallbladder. Aortic Atherosclerosis (ICD10-I70.0) and Emphysema (ICD10-J43.9). Electronically Signed   By: Katherine Mantle M.D.   On: 11/14/2020 01:04   CT ABDOMEN PELVIS W CONTRAST  Result Date: 11/14/2020 CLINICAL DATA:  Shortness of breath.  COPD.  Sepsis. EXAM: CT ANGIOGRAPHY CHEST CT ABDOMEN AND PELVIS WITH CONTRAST TECHNIQUE: Multidetector CT imaging of the chest was performed using the standard protocol during bolus administration of intravenous contrast. Multiplanar CT  image reconstructions and MIPs were obtained to evaluate the vascular anatomy. Multidetector CT imaging of the abdomen and pelvis was performed using the standard protocol during bolus administration of intravenous contrast. CONTRAST:  OMNIPAQUE IOHEXOL 350 MG/ML SOLN COMPARISON:  CT dated September 08, 2020. FINDINGS: CTA CHEST FINDINGS Cardiovascular: Evaluation is limited by respiratory motion artifact. Within that limitation, there is no central pulmonary embolus. The size of the main pulmonary artery is normal. Cardiomegaly with coronary artery calcification. Atherosclerotic changes of the thoracic aorta. There is an ascending thoracic aortic aneurysm measuring 4.5 cm. Atherosclerotic changes are noted. Mediastinum/Nodes: -- No mediastinal lymphadenopathy. -- No hilar lymphadenopathy. -- No axillary lymphadenopathy. -- No supraclavicular lymphadenopathy. -- Normal thyroid gland where visualized. -  Unremarkable esophagus. Lungs/Pleura: There is airspace consolidation at the lung bases. There is bronchial wall thickening and areas of mucous plugging primarily in the lower lobes. Emphysematous changes are noted. There is no pneumothorax. Musculoskeletal: No chest wall abnormality. No bony spinal canal stenosis. Review of the MIP images confirms the above findings. CT ABDOMEN and PELVIS FINDINGS Hepatobiliary: The liver is cirrhotic in appearance. There is a cholecystostomy tube in place. The gallbladder is decompressed. The tube appears grossly well position.There is no biliary ductal dilation. Pancreas: Normal contours without ductal dilatation. No peripancreatic fluid collection. Spleen: Unremarkable. Adrenals/Urinary Tract: --Adrenal glands: Unremarkable. --Right kidney/ureter: No hydronephrosis or radiopaque kidney stones. --Left kidney/ureter: No hydronephrosis or radiopaque kidney stones. --Urinary bladder: Again noted is an enhancing mass along the right lateral wall of the urinary bladder. This mass  appears grossly similar in size to prior study. Stomach/Bowel: --Stomach/Duodenum: No hiatal hernia or other gastric abnormality. Normal duodenal course and caliber. --Small bowel: Unremarkable. --Colon: There is diffuse sigmoid diverticulosis with questionable early uncomplicated diverticulitis involving the sigmoid colon (axial series 5, image 51). --Appendix: Not visualized. No right lower quadrant inflammation or free fluid. Vascular/Lymphatic: The patient is status post prior EVAR. The excluded aneurysm sac is essentially stable from prior study. --No retroperitoneal lymphadenopathy. --No mesenteric lymphadenopathy. --No pelvic or inguinal lymphadenopathy. Reproductive: Unremarkable Other: No ascites or free air. The abdominal wall is normal. Musculoskeletal. No acute displaced fractures. Review of the MIP images confirms the above findings. IMPRESSION: 1. Evaluation is limited by respiratory motion artifact. Within that limitation, there is no central pulmonary embolus. 2. Bilateral lower lobe airspace consolidation, concerning for pneumonia or aspiration. 3. Diffuse sigmoid diverticulosis with questionable early uncomplicated diverticulitis involving the sigmoid colon. 4. Persistent enhancing mass along the right lateral wall of the urinary bladder, concerning for bladder carcinoma. 5. Cirrhosis. 6. Stable abdominal and thoracic aortic aneurysms as detailed above. Please see prior reports for follow-up recommendations. 7. Stable cholecystostomy tube with decompression of the gallbladder. Aortic Atherosclerosis (ICD10-I70.0) and Emphysema (ICD10-J43.9). Electronically Signed   By: Constance Holster M.D.   On: 11/14/2020 01:04   DG Chest Port 1 View  Result Date: 11/13/2020 CLINICAL DATA:  Shortness of breath and decreased oxygen saturation. COPD with chronic oxygen use. EXAM: PORTABLE CHEST 1 VIEW COMPARISON:  09/18/2020 FINDINGS: Postoperative changes in the mediastinum. Cardiac enlargement. Pulmonary  vascular congestion with diffuse interstitial pattern likely edema but pneumonia could also have this appearance. Changes are more prominent than on the previous study. No definite pleural effusion. No pneumothorax. Mediastinal contours appear intact. Calcification of the aorta. IMPRESSION: Cardiac enlargement with pulmonary vascular congestion and interstitial edema. Electronically Signed   By: Lucienne Capers M.D.   On: 11/13/2020 22:26        Scheduled Meds: . vitamin C  500 mg Oral Daily  . Chlorhexidine Gluconate Cloth  6 each Topical Daily  . cholecalciferol  1,000 Units Oral Daily  . enoxaparin (LOVENOX) injection  40 mg Subcutaneous Q24H  . levothyroxine  100 mcg Oral Q0600  . macitentan  10 mg Oral Daily  . methylPREDNISolone (SOLU-MEDROL) injection  0.5 mg/kg Intravenous Q12H   Followed by  . [START ON 11/17/2020] predniSONE  50 mg Oral Daily  . midodrine  15 mg Oral TID PC  . mometasone-formoterol  2 puff Inhalation BID  . montelukast  10 mg Oral QHS  . pantoprazole  20 mg Oral BID  . sildenafil  40 mg Oral TID  . simvastatin  10 mg Oral QHS  .  zinc sulfate  220 mg Oral Daily   Continuous Infusions: . piperacillin-tazobactam (ZOSYN)  IV 3.375 g (11/15/20 0525)  . remdesivir 100 mg in NS 100 mL       LOS: 1 day    Time spent: 35 minutes.     Elmarie Shiley, MD [facility]   If 7PM-7AM, please contact night-coverage [email or web address]  11/15/2020, 7:24 AM

## 2020-11-15 NOTE — Plan of Care (Signed)
  Problem: Education: Goal: Knowledge of General Education information will improve Description: Including pain rating scale, medication(s)/side effects and non-pharmacologic comfort measures Outcome: Adequate for Discharge   

## 2020-11-15 NOTE — Plan of Care (Signed)
  Problem: Education: Goal: Knowledge of General Education information will improve Description Including pain rating scale, medication(s)/side effects and non-pharmacologic comfort measures Outcome: Progressing   

## 2020-11-15 NOTE — Evaluation (Addendum)
Physical Therapy Evaluation Patient Details Name: Vincent Robinson MRN: [id number] DOB: 20-Feb-1923 Today's Date: 11/15/2020   History of Present Illness  87 y.o. male with medical history significant of asthma, COPD, chronic respiratory failure on home oxygen, CAD, hyperlipidemia, hypothyroidism, chronic diastolic heart failure, pulmonary hypertension, hospital admission in November 2021 for septic shock secondary to acute cholecystitis status post percutaneous gallbladder drain placement presenting to the ED via EMS for evaluation of shortness of breath.  EMS reported oxygen saturation of 70% on 5 L home oxygen and his fingers were blue.  He was found to be positive for COVID-19.  Leukocytosis, hypokalemia, BNP 299, chest x-ray with cardiac enlargement and pulmonary vascular congestion.  CTA was negative for PE but did show bilateral lower lobe airspace consolidations concerning for pneumonia or aspiration.  Clinical Impression  Pt very eager and motivated to participate in therapy evaluation. Ambulating 20 feet with no assistive device and min assist. SpO2 90-94% on 6L O2, HR 74-93 bpm. Pt able to participate in seated exercises and sit to stands for functional strengthening. Suspect good progress. Recommending HHPT to address deficits and maximize functional mobility.     Follow Up Recommendations Home health PT;Supervision for mobility/OOB    Equipment Recommendations  None recommended by PT    Recommendations for Other Services       Precautions / Restrictions Precautions Precautions: Fall Precaution Comments: COVID; watch Spo2 Restrictions Weight Bearing Restrictions: No      Mobility  Bed Mobility               General bed mobility comments: OOB in chair    Transfers Overall transfer level: Needs assistance Equipment used: None Transfers: Sit to/from Stand Sit to Stand: Min guard            Ambulation/Gait Ambulation/Gait assistance: Min assist Gait Distance  (Feet): 20 Feet Assistive device: None Gait Pattern/deviations: Step-through pattern;Decreased stride length Gait velocity: decreased   General Gait Details: Pt requiring up to minA for stability due to one lateral LOB at sink  Science writer    Modified Rankin (Stroke Patients Only)       Balance Overall balance assessment: Needs assistance Sitting-balance support: No upper extremity supported;Feet supported Sitting balance-Leahy Scale: Good     Standing balance support: No upper extremity supported;Single extremity supported;During functional activity Standing balance-Leahy Scale: Fair                               Pertinent Vitals/Pain Pain Assessment: No/denies pain    Home Living Family/patient expects to be discharged to:: Private residence Living Arrangements: Alone Available Help at Discharge: Family;Available 24 hours/day Type of Home: House Home Access: Level entry     Home Layout: One level Home Equipment: Walker - 4 wheels Additional Comments: Pt reports he can dc to daughter home and stay with her. 2-3 L home O2. Pt's wife is at [facility] for increased care due to cancer.    Prior Function Level of Independence: Independent with assistive device(s)         Comments: Used a RW after last hospital stay, but reports he typically does not use DME. Reports he performs ADLs, IADLs, and drives. "I am an active person"     Hand Dominance   Dominant Hand: Right    Extremity/Trunk Assessment   Upper Extremity Assessment Upper Extremity Assessment: Overall [facility]  for tasks assessed    Lower Extremity Assessment Lower Extremity Assessment: Overall WFL for tasks assessed    Cervical / Trunk Assessment Cervical / Trunk Assessment: Kyphotic  Communication   Communication: No difficulties  Cognition Arousal/Alertness: Awake/alert Behavior During Therapy: WFL for tasks assessed/performed Overall Cognitive  Status: Within Functional Limits for tasks assessed                                 General Comments: Very agreeable to therapy      General Comments      Exercises General Exercises - Upper Extremity Shoulder Flexion: Both;10 reps;Seated General Exercises - Lower Extremity Long Arc Quad: Both;10 reps;Seated Hip Flexion/Marching: Both;20 reps;Seated Heel Raises: Both;15 reps;Seated Other Exercises Other Exercises: x5 Sit to stands   Assessment/Plan    PT Assessment Patient needs continued PT services  PT Problem List Decreased strength;Decreased activity tolerance;Decreased balance;Decreased mobility;Cardiopulmonary status limiting activity       PT Treatment Interventions DME instruction;Gait training;Functional mobility training;Therapeutic activities;Balance training;Therapeutic exercise;Patient/family education    PT Goals (Current goals can be found in the Care Plan section)  Acute Rehab PT Goals Patient Stated Goal: Go home PT Goal Formulation: With patient Time For Goal Achievement: 11/29/20 Potential to Achieve Goals: Good    Frequency Min 3X/week   Barriers to discharge        Co-evaluation               AM-PAC PT "6 Clicks" Mobility  Outcome Measure Help needed turning from your back to your side while in a flat bed without using bedrails?: None Help needed moving from lying on your back to sitting on the side of a flat bed without using bedrails?: None Help needed moving to and from a bed to a chair (including a wheelchair)?: A Little Help needed standing up from a chair using your arms (e.g., wheelchair or bedside chair)?: A Little Help needed to walk in hospital room?: A Little Help needed climbing 3-5 steps with a railing? : A Lot 6 Click Score: 19    End of Session Equipment Utilized During Treatment: Oxygen Activity Tolerance: Patient tolerated treatment well Patient left: in chair;with call bell/phone within reach;with chair  alarm set Nurse Communication: Mobility status PT Visit Diagnosis: Unsteadiness on feet (R26.81);Difficulty in walking, not elsewhere classified (R26.2)    Time: 1133-1206 PT Time Calculation (min) (ACUTE ONLY): 33 min   Charges:   PT Evaluation $PT Eval Moderate Complexity: 1 Mod PT Treatments $Therapeutic Exercise: 8-22 mins        Wyona Almas, PT, DPT Acute Rehabilitation Services Pager [phone] Office [phone]   Deno Etienne 11/15/2020, 1:43 PM

## 2020-11-15 NOTE — Progress Notes (Signed)
Pt is distressed because he wants to wear bi-pap tonight. I will monitor oxygen saturation throughout the night.

## 2020-11-15 NOTE — Evaluation (Signed)
Clinical/Bedside Swallow Evaluation Patient Details  Name: Vincent Robinson MRN: [id number] Date of Birth: 01/14/1923  Today's Date: 11/15/2020 Time: SLP Start Time (ACUTE ONLY): 6606 SLP Stop Time (ACUTE ONLY): 0938 SLP Time Calculation (min) (ACUTE ONLY): 10 min  Past Medical History:  Past Medical History:  Diagnosis Date  . AAA (abdominal aortic aneurysm) ([place])   . Acute respiratory failure ([place]) 11/23/2019  . Asthma   . Basal cell carcinoma   . Chronic respiratory failure ([place])   . COPD (chronic obstructive pulmonary disease) ([place])   . Coronary artery disease   . History of chronic respiratory failure   . Hyperlipidemia   . Hypothyroidism   . Right heart failure ([place])   . Skin cancer 2017  . Sleep apnea    Past Surgical History:  Past Surgical History:  Procedure Laterality Date  . bleeding ulcer  2016  . BYPASS AXILLA/BRACHIAL ARTERY  1998  . colon bleed  2012  . COLONOSCOPY N/A 06/07/2017   Procedure: COLONOSCOPY;  Surgeon: Carol Ada, MD;  Location: [facility];  Service: Endoscopy;  Laterality: N/A;  . DEBRIDMENT OF DECUBITUS ULCER  2002  . IR ANGIOGRAM SELECTIVE EACH ADDITIONAL VESSEL  03/22/2020  . IR ANGIOGRAM VISCERAL SELECTIVE  03/22/2020  . IR PERC CHOLECYSTOSTOMY  09/08/2020  . IR RADIOLOGIST EVAL & MGMT  10/06/2020  . IR RADIOLOGIST EVAL & MGMT  10/26/2020  . IR US GUIDE VASC ACCESS RIGHT  03/22/2020  . RIGHT HEART CATH N/A 12/11/2017   Procedure: RIGHT HEART CATH;  Surgeon: Jolaine Artist, MD;  Location: [facility];  Service: Cardiovascular;  Laterality: N/A;  . THORACIC AORTA STENT  2009   HPI:  Vincent Robinson is a 87 y.o. male with medical history significant of asthma, COPD, chronic respiratory failure on home oxygen, CAD, hyperlipidemia, hypothyroidism, chronic diastolic heart failure, pulmonary hypertension, hospital admission in November 2021 for septic shock secondary to acute cholecystitis status post percutaneous gallbladder drain placement  presenting to the ED via EMS for evaluation of shortness of breath.  EMS reported oxygen saturation of 70% on 5 L home oxygen and his fingers were blue.  He was found to be positive for COVID-19.  Leukocytosis, hypokalemia, BNP 299, chest x-ray with cardiac enlargement and pulmonary vascular congestion.  CTA was negative for PE but did show bilateral lower lobe airspace consolidations concerning for pneumonia or aspiration.   Assessment / Plan / Recommendation Clinical Impression  Pt denies any difficulty swallowing and exhibits no impairment during subjective observation. Risk of dysphagia related aspiration is low. Recommend pt continue a regular diet and thin liquids. Will sign off.      Aspiration Risk       Diet Recommendation Regular;Thin liquid   Liquid Administration via: Cup;Straw Medication Administration: Whole meds with liquid Supervision: Patient able to self feed    Other  Recommendations Oral Care Recommendations: Oral care BID   Follow up Recommendations 24 hour supervision/assistance      Frequency and Duration            Prognosis        Swallow Study   General HPI: Vincent Robinson is a 87 y.o. male with medical history significant of asthma, COPD, chronic respiratory failure on home oxygen, CAD, hyperlipidemia, hypothyroidism, chronic diastolic heart failure, pulmonary hypertension, hospital admission in November 2021 for septic shock secondary to acute cholecystitis status post percutaneous gallbladder drain placement presenting to the ED via EMS for evaluation of shortness of breath.  EMS reported oxygen saturation of 70% on 5 L home oxygen and his fingers were blue.  He was found to be positive for COVID-19.  Leukocytosis, hypokalemia, BNP 299, chest x-ray with cardiac enlargement and pulmonary vascular congestion.  CTA was negative for PE but did show bilateral lower lobe airspace consolidations concerning for pneumonia or aspiration. Type of Study: Bedside  Swallow Evaluation Previous Swallow Assessment: none Diet Prior to this Study: Regular;Thin liquids Temperature Spikes Noted: No Respiratory Status: Nasal cannula History of Recent Intubation: No Behavior/Cognition: Alert;Cooperative;Pleasant mood Oral Cavity Assessment: Within Functional Limits Oral Care Completed by SLP: No Oral Cavity - Dentition: Adequate natural dentition Vision: Functional for self-feeding Self-Feeding Abilities: Able to feed self Patient Positioning: Upright in chair Baseline Vocal Quality: Normal Volitional Cough: Strong Volitional Swallow: Able to elicit    Oral/Motor/Sensory Function Overall Oral Motor/Sensory Function: Within functional limits   Ice Chips     Thin Liquid Thin Liquid: Within functional limits    Nectar Thick Nectar Thick Liquid: Not tested   Honey Thick Honey Thick Liquid: Not tested   Puree Puree: Within functional limits   Solid     Solid: Within functional limits     Vincent [place], MA [place] Pager [phone] Office [phone]  Vincent Robinson, [name] 11/15/2020,10:47 AM

## 2020-11-16 ENCOUNTER — Telehealth (HOSPITAL_COMMUNITY): Payer: Self-pay | Admitting: Pharmacy Technician

## 2020-11-16 LAB — COMPREHENSIVE METABOLIC PANEL
ALT: 16 U/L (ref 0–44)
AST: 22 U/L (ref 15–41)
Albumin: 2.5 g/dL — ABNORMAL LOW (ref 3.5–5.0)
Alkaline Phosphatase: 59 U/L (ref 38–126)
Anion gap: 10 (ref 5–15)
BUN: 34 mg/dL — ABNORMAL HIGH (ref 8–23)
CO2: 22 mmol/L (ref 22–32)
Calcium: 8.3 mg/dL — ABNORMAL LOW (ref 8.9–10.3)
Chloride: 99 mmol/L (ref 98–111)
Creatinine, Ser: 1.11 mg/dL (ref 0.61–1.24)
GFR, Estimated: >60 mL/min (ref >60)
Glucose, Bld: 140 mg/dL — ABNORMAL HIGH (ref 70–99)
Potassium: 4.4 mmol/L (ref 3.5–5.1)
Sodium: 131 mmol/L — ABNORMAL LOW (ref 135–145)
Total Bilirubin: 0.9 mg/dL (ref 0.3–1.2)
Total Protein: 6.3 g/dL — ABNORMAL LOW (ref 6.5–8.1)

## 2020-11-16 LAB — CBC WITH DIFFERENTIAL/PLATELET
Abs Immature Granulocytes: 0.06 10*3/uL (ref 0.00–0.07)
Basophils Absolute: 0 10*3/uL (ref 0.0–0.1)
Basophils Relative: 0 %
Eosinophils Absolute: 0 10*3/uL (ref 0.0–0.5)
Eosinophils Relative: 0 %
HCT: 32.4 % — ABNORMAL LOW (ref 39.0–52.0)
Hemoglobin: 10.5 g/dL — ABNORMAL LOW (ref 13.0–17.0)
Immature Granulocytes: 1 %
Lymphocytes Relative: 6 %
Lymphs Abs: 0.8 10*3/uL (ref 0.7–4.0)
MCH: 31.4 pg (ref 26.0–34.0)
MCHC: 32.4 g/dL (ref 30.0–36.0)
MCV: 97 fL (ref 80.0–100.0)
Monocytes Absolute: 0.6 10*3/uL (ref 0.1–1.0)
Monocytes Relative: 4 %
Neutro Abs: 11.8 10*3/uL — ABNORMAL HIGH (ref 1.7–7.7)
Neutrophils Relative %: 89 %
Platelets: 273 10*3/uL (ref 150–400)
RBC: 3.34 MIL/uL — ABNORMAL LOW (ref 4.22–5.81)
RDW: 14.7 % (ref 11.5–15.5)
WBC: 13.3 10*3/uL — ABNORMAL HIGH (ref 4.0–10.5)
nRBC: 0 % (ref 0.0–0.2)

## 2020-11-16 LAB — D-DIMER, QUANTITATIVE: D-Dimer, Quant: 1.18 ug/mL-FEU — ABNORMAL HIGH (ref 0.00–0.50)

## 2020-11-16 LAB — MAGNESIUM: Magnesium: 3.1 mg/dL — ABNORMAL HIGH (ref 1.7–2.4)

## 2020-11-16 LAB — C-REACTIVE PROTEIN: CRP: 8 mg/dL — ABNORMAL HIGH (ref <1.0)

## 2020-11-16 NOTE — Telephone Encounter (Signed)
Advanced Heart Failure Patient Advocate Encounter   Patient was approved to receive Ops[employer]it from [employer]  Patient ID: [id number] Effective dates: 10/24/20 through 10/23/21  Charlann Boxer, CPhT

## 2020-11-16 NOTE — Progress Notes (Signed)
Pharmacy Antibiotic Note  Vincent Robinson is a 87 y.o. male admitted on 11/13/2020 with diverticulitis and possible aspiration pneumonia in the setting of COVID.  Pharmacy has been consulted for zosyn dosing.  ClCr 37 ml/min. WBC elevated on steroids. CRP down. Afebrile. Continue abx for now.   Plan: Zosyn 3.375g IV q8h (4 hour infusion).  F/u renal function, cultures and clinical course  Height: 5\' 7"  (170.2 cm) Weight: 71.2 kg (157 lb) IBW/kg (Calculated) : 66.1  Temp (24hrs), Avg:97.7 F (36.5 C), Min:97.4 F (36.3 C), Max:98.5 F (36.9 C)  Recent Labs  Lab 11/13/20 2234 11/13/20 2335 11/14/20 0848 11/15/20 0621 11/16/20 0314  WBC 14.6*  --   --  10.4 13.3*  CREATININE 1.16  --  1.23 1.12 1.11  LATICACIDVEN 1.9 1.6  --   --   --     Estimated Creatinine Clearance: 37.2 mL/min (by C-G formula based on SCr of 1.11 mg/dL).    No Known Allergies   Antibiotics Piptazo 1/23 >>  Remdesivir 1/24 >> 1/28  Microbiology 1/23 Bcx NGTD 1/23 COVID +    Thank you for allowing pharmacy to be a part of this patient's care.  Benetta Spar, PharmD, BCPS, BCCP Clinical Pharmacist  Please check AMION for all [place] phone numbers After 10:00 PM, call [place] [phone]

## 2020-11-16 NOTE — TOC Initial Note (Addendum)
Transition of Care [facility]) - Initial/Assessment Note    Patient Details  Name: Vincent Robinson MRN: [id number] Date of Birth: [birth year]  Transition of [facility]) CM/SW Contact:    Sherrilyn Rist Transition of Care Supervisor Phone Number: [phone] 11/16/2020, 9:16 AM  Clinical Narrative:                 Chart reviewed. Patient lives at home with Daughter [name]; PCP: Katherina Mires, MD; has private insurance with Medicare; Patient is active with [facility] as prior to admission. Cory with [facility] made aware; TOC will continue to follow for progression of care.  4:11 pm- Telephone call to daughter; patient lives with daughter, spouse is a resident at [facility] for about 4 months and currently he is living with his daughter; He has been vaccinated and she is requesting patient to be placed in a nursing facility due to her inability to provide 24hr care. TCT patient; he is alert and oriented, stated that he wants to go to SNF short term and would like to go to [facility] if possible or to a facility close to [place], [place].   Expected Discharge Plan: [place] Barriers to Discharge: No Barriers Identified   Patient Goals and CMS Choice Patient states their goals for this hospitalization and ongoing recovery are:: to get stronger CMS Medicare.gov Compare Post Acute Care list provided to::  (adult children) Choice offered to / list presented to : NA  Expected Discharge Plan and Services Expected Discharge Plan: [place] In-house Referral: NA Discharge Planning Services: CM Consult Post Acute Care Choice: [place] arrangements for the past 2 months: [place]: [place] Date [place]: 11/16/20 Time HH Agency Contacted: [phone] Representative spoke with at [place]: Georgina Snell  Prior Living Arrangements/Services Living arrangements for the past 2  months: [place]   Patient language and need for interpreter reviewed:: No        Need for Family Participation in Patient Care: Yes (Comment) Care giver support system in place?: Yes (comment)   Criminal Activity/Legal Involvement Pertinent to Current Situation/Hospitalization: No - Comment as needed  Activities of Daily Living Home Assistive Devices/Equipment: None ADL Screening (condition at time of admission) Patient's cognitive ability adequate to safely complete daily activities?: Yes Is the patient deaf or have difficulty hearing?: Yes Does the patient have difficulty seeing, even when wearing glasses/contacts?: No Does the patient have difficulty concentrating, remembering, or making decisions?: No Patient able to express need for assistance with ADLs?: Yes Does the patient have difficulty dressing or bathing?: No Independently performs ADLs?: Yes (appropriate for developmental age) Does the patient have difficulty walking or climbing stairs?: Yes Weakness of Legs: Both Weakness of Arms/Hands: Both  Permission Sought/Granted                  Emotional Assessment         Alcohol / Substance Use: Never Used Psych Involvement: No (comment)  Admission diagnosis:  Sepsis ([place]) [A41.9] Community acquired pneumonia, unspecified laterality [J18.9] Sepsis, due to unspecified organism, unspecified whether acute organ dysfunction present ([place]) [A41.9] Pneumonia due to COVID-19 virus [U07.1, J12.82] Patient Active Problem List   Diagnosis Date Noted  . Pneumonia due to COVID-19 virus  11/14/2020  . Elevated troponin 11/14/2020  . Bladder mass 11/14/2020  . Acute cholecystitis 09/08/2020  . Diverticular hemorrhage   . Rectal bleeding 03/22/2020  . Acute GI bleeding 03/22/2020  . Hypokalemia   . Hypotension   . Acute respiratory failure with hypoxia ([place]) 12/18/2019  . Acute on chronic right-sided heart failure ([place]) 12/18/2019  . Pressure injury of skin  11/24/2019  . Chronic respiratory failure ([place])   . Acute on chronic respiratory failure ([place]) 11/23/2019  . Right heart failure ([place])   . S/P CABG (coronary artery bypass graft) 11/22/2019  . Chest pain 11/22/2019  . Atrial fibrillation ([place]) 08/04/2019  . Pulmonary hypertension, unspecified ([place]) 10/11/2017  . Aortic stenosis, moderate 10/11/2017  . Coronary artery disease 08/30/2017  . AAA (abdominal aortic aneurysm) without rupture ([place]) 08/30/2017  . Acute blood loss anemia 06/07/2017  . Hemorrhagic shock ([place]) 06/07/2017  . Lower GI bleed 06/05/2017  . Sleep apnea 06/05/2017  . Hypothyroidism 06/05/2017  . Hyperlipidemia 06/05/2017  . Asthma 06/05/2017  . ILD (interstitial lung disease) ([place]) 05/28/2017  . Pneumothorax on right 05/20/2017  . COPD (chronic obstructive pulmonary disease) ([place]) 05/20/2017  . Pneumothorax 05/20/2017  . Skin cancer 10/23/2015   PCP:  Katherina Mires, MD Pharmacy:   [facility] [place], [place] [address] Phone: [phone] Fax: [phone]     Social Determinants of Health (SDOH) Interventions    Readmission Risk Interventions Readmission Risk Prevention Plan 09/19/2020 03/25/2020 11/24/2019  Transportation Screening Complete Complete Complete  PCP or Specialist Appt within 5-7 Days - - Patient refused  Home Care Screening - - Complete  Medication Review (RN CM) - - Referral to Pharmacy  [place] or Home Care Consult Complete Patient refused -  Social Work Consult for [place] Planning/Counseling Complete - -  [place] Screening Complete Not Applicable -  Medication Review (RN Care Manager) Complete Complete -  Some recent data might be hidden

## 2020-11-16 NOTE — NC FL2 (Signed)
[place] LEVEL OF CARE SCREENING TOOL     IDENTIFICATION  Patient Name: Vincent Robinson Birthdate: May 27, 1923 Sex: male Admission Date (Current Location): 11/13/2020  [facility] and [place] Number:  Herbalist and Address:  [facility] [place], [place] [address] [place], [place] [address]      Provider Number: [id number]  Attending Physician Name and Address:  Elmarie Shiley, MD  Relative Name and Phone Number:  [name] (Daughter)   [phone] [facility])    Current Level of Care: Hospital Recommended Level of Care: [place] Prior Approval Number:    Date Approved/Denied:   PASRR Number: [id number]  Discharge Plan: Home    Current Diagnoses: Patient Active Problem List   Diagnosis Date Noted  . Pneumonia due to COVID-19 virus 11/14/2020  . Elevated troponin 11/14/2020  . Bladder mass 11/14/2020  . Acute cholecystitis 09/08/2020  . Diverticular hemorrhage   . Rectal bleeding 03/22/2020  . Acute GI bleeding 03/22/2020  . Hypokalemia   . Hypotension   . Acute respiratory failure with hypoxia ([place]) 12/18/2019  . Acute on chronic right-sided heart failure ([place]) 12/18/2019  . Pressure injury of skin 11/24/2019  . Chronic respiratory failure ([place])   . Acute on chronic respiratory failure ([place]) 11/23/2019  . Right heart failure ([place])   . S/P CABG (coronary artery bypass graft) 11/22/2019  . Chest pain 11/22/2019  . Atrial fibrillation ([place]) 08/04/2019  . Pulmonary hypertension, unspecified ([place]) 10/11/2017  . Aortic stenosis, moderate 10/11/2017  . Coronary artery disease 08/30/2017  . AAA (abdominal aortic aneurysm) without rupture ([place]) 08/30/2017  . Acute blood loss anemia 06/07/2017  . Hemorrhagic shock ([place]) 06/07/2017  . Lower GI bleed 06/05/2017  . Sleep apnea 06/05/2017  . Hypothyroidism 06/05/2017  . Hyperlipidemia 06/05/2017  . Asthma 06/05/2017  . ILD (interstitial lung disease) ([place])  05/28/2017  . Pneumothorax on right 05/20/2017  . COPD (chronic obstructive pulmonary disease) ([place]) 05/20/2017  . Pneumothorax 05/20/2017  . Skin cancer 10/23/2015    Orientation RESPIRATION BLADDER Height & Weight     Self,Time,Situation,Place  O2 (HFNC 5L) Continent Weight: 157 lb (71.2 kg) Height:  5\' 7"  (170.2 cm)  BEHAVIORAL SYMPTOMS/MOOD NEUROLOGICAL BOWEL NUTRITION STATUS      Continent Diet (see d/c summary)  AMBULATORY STATUS COMMUNICATION OF NEEDS Skin   Limited Assist Verbally Other (Comment) (Biliary tube)                       Personal Care Assistance Level of Assistance  Bathing,Dressing,Feeding Bathing Assistance: Limited assistance Feeding assistance: Independent Dressing Assistance: Limited assistance     Functional Limitations Info  Sight,Hearing,Speech Sight Info: Adequate Hearing Info: Adequate Speech Info: Adequate    SPECIAL CARE FACTORS FREQUENCY  PT (By licensed PT),OT (By licensed OT)     PT Frequency: 5x/week OT Frequency: 5x/week            Contractures Contractures Info: Not present    Additional Factors Info  Allergies,Code Status,Isolation Precautions Code Status Info: DNR Allergies Info: No known allergies     Isolation Precautions Info: Covid 19     Current Medications (11/16/2020):  This is the current hospital active medication list Current Facility-Administered Medications  Medication Dose Route Frequency Provider Last Rate Last Admin  . acetaminophen (TYLENOL) tablet 650 mg  650 mg Oral Q6H PRN Shela Leff, MD      . albuterol (VENTOLIN HFA) 108 (90 Base) MCG/ACT inhaler 2 puff  2 puff Inhalation Q4H PRN Shela Leff, MD      . ascorbic acid (VITAMIN C) tablet 500 mg  500 mg Oral Daily Shela Leff, MD   500 mg at 11/16/20 0940  . Chlorhexidine Gluconate Cloth 2 % PADS 6 each  6 each Topical Daily Lorella Nimrod, MD   6 each at 11/16/20 0941  . cholecalciferol (VITAMIN D3) tablet 1,000 Units  1,000  Units Oral Daily Shela Leff, MD   1,000 Units at 11/16/20 0941  . enoxaparin (LOVENOX) injection 40 mg  40 mg Subcutaneous Q24H Shela Leff, MD   40 mg at 11/16/20 0453  . fluticasone (FLONASE) 50 MCG/ACT nasal spray 1 spray  1 spray Each Nare Daily PRN Shela Leff, MD      . guaiFENesin-dextromethorphan (ROBITUSSIN DM) 100-10 MG/5ML syrup 10 mL  10 mL Oral Q4H PRN Shela Leff, MD      . levothyroxine (SYNTHROID) tablet 100 mcg  100 mcg Oral [id number] Shela Leff, MD   100 mcg at 11/16/20 0451  . loratadine (CLARITIN) tablet 10 mg  10 mg Oral Daily PRN Shela Leff, MD      . macitentan (OPSUMIT) tablet 10 mg  10 mg Oral Daily Shela Leff, MD   10 mg at 11/16/20 0941  . methylPREDNISolone sodium succinate (SOLU-MEDROL) 40 mg/mL injection 34.8 mg  0.5 mg/kg Intravenous Q12H Shela Leff, MD   34.8 mg at 11/16/20 0356   Followed by  . [START ON 11/17/2020] predniSONE (DELTASONE) tablet 50 mg  50 mg Oral Daily Shela Leff, MD      . midodrine (PROAMATINE) tablet 15 mg  15 mg Oral TID Dossie Der, MD   15 mg at 11/16/20 1428  . mometasone-formoterol (DULERA) 100-5 MCG/ACT inhaler 2 puff  2 puff Inhalation BID Lorella Nimrod, MD   2 puff at 11/15/20 1957  . montelukast (SINGULAIR) tablet 10 mg  10 mg Oral QHS Shela Leff, MD   10 mg at 11/15/20 2110  . pantoprazole (PROTONIX) EC tablet 20 mg  20 mg Oral BID Shela Leff, MD   20 mg at 11/16/20 0941  . piperacillin-tazobactam (ZOSYN) IVPB 3.375 g  3.375 g Intravenous Q8H Shela Leff, MD 12.5 mL/hr at 11/16/20 1432 3.375 g at 11/16/20 1432  . remdesivir 100 mg in sodium chloride 0.9 % 100 mL IVPB  100 mg Intravenous Daily Shela Leff, MD 200 mL/hr at 11/16/20 0958 100 mg at 11/16/20 0958  . sildenafil (REVATIO) tablet 40 mg  40 mg Oral TID Shela Leff, MD   40 mg at 11/16/20 0941  . simvastatin (ZOCOR) tablet 10 mg  10 mg Oral QHS Shela Leff, MD   10  mg at 11/15/20 2110  . zinc sulfate capsule 220 mg  220 mg Oral Daily Shela Leff, MD   220 mg at 11/16/20 0940     Discharge Medications: Please see discharge summary for a list of discharge medications.  Relevant Imaging Results:  Relevant Lab Results:   Additional Information SSN [place], [place]

## 2020-11-16 NOTE — Progress Notes (Signed)
   11/16/20 0615  Assess: MEWS Score  ECG Heart Rate (!) 43  Assess: MEWS Score  MEWS Temp 0  MEWS Systolic 1  MEWS Pulse 1  MEWS RR 0  MEWS LOC 0  MEWS Score 2  MEWS Score Color Yellow  Assess: if the MEWS score is Yellow or Red  Were vital signs taken at a resting state? Yes  Focused Assessment No change from prior assessment  Early Detection of Sepsis Score *See Row Information* Low  MEWS guidelines implemented *See Row Information* No, previously yellow, continue vital signs every 4 hours  Treat  MEWS Interventions Other (Comment)  Pain Scale 0-10  Pain Score 0  Escalate  MEWS: Escalate Yellow: discuss with charge nurse/RN and consider discussing with provider and RRT  Notify: Charge Nurse/RN  Name of Charge Nurse/RN Notified [place] RN  Date Charge Nurse/RN Notified 11/16/20  Time Charge Nurse/RN Notified [phone]

## 2020-11-16 NOTE — Progress Notes (Signed)
PROGRESS NOTE    Vincent Robinson  KZS:[id number] DOB: May 14, 1923 DOA: 11/13/2020 PCP: Katherina Mires, MD   Brief Narrative: 87 year old with past medical history significant for asthma, COPD, chronic respiratory failure on home oxygen, CAD, hyperlipidemia, hypothyroidism, chronic diastolic heart failure, pulmonary hypertension, hospital admission November 2021 for septic shock secondary to acute cholecystitis status post percutaneous gallbladder drain placement presented to the ED for evaluation of shortness of breath. EMS reported oxygen saturation 70% on 5 L home oxygen.  Patient was tachypneic and labored breathing.  Patient was placed on nonrebreather with improvement of his oxygen saturation. See if 2 doses of Covid vaccine, he did not receive a booster shot yet.  He has a history of bladder cancer for which he is seen by Dr. Diona Fanti who discussed removing the tumor but his cardiology felt that the surgery was not safe for him. Patient was found to be positive for COVID-19.  Chest x-ray with pulmonary vascular congestion.  CTA was negative for PE but did show bilateral lower lobe airspace consolidation concerning for pneumonia and aspiration.  CT abdomen with some concern of questionable early uncomplicated diverticulitis, a stable cholecystostomy tube with decompression of gallbladder.  Patient was a started on Remdesivir , a steroid and Zosyn.    Assessment & Plan:   Principal Problem:   Pneumonia due to COVID-19 virus Active Problems:   COPD (chronic obstructive pulmonary disease) (HCC)   Acute on chronic respiratory failure (HCC)   Elevated troponin   Bladder mass   1-Acute on chronic hypoxic respiratory failure and sepsis secondary to COVID-19 viral pneumonia, possible aspiration pneumonia: Chronically on 4 to 5 L of oxygen at home. CTA negative for PE. Continue with remdesivir and IVF steroid day 3. Continue with Zosyn. Position as tolerated. Needs to Quarantine until:  2/12  COVID-19 Labs  Recent Labs    11/14/20 0409 11/15/20 0621 11/16/20 0314  DDIMER 2.15* 1.79* 1.18*  FERRITIN 69  --   --   LDH 166  --   --   CRP 4.8* 11.4* 8.0*    Lab Results  Component Value Date   SARSCOV2NAA POSITIVE (A) 11/13/2020   [place] NEGATIVE 09/08/2020   [place] NEGATIVE 03/22/2020   [place] NEGATIVE 12/18/2019    2-questionable early diverticulitis: CT abdomen concerning with early diverticulitis: Continue with IV Zosyn, day 3.  History of cholecystitis, status post percutaneous cholecystostomy tube; Continue to drain in place 1/18. Needs drain exchange in 3 to 6 weeks.  Fail capping trial. IR recommend to keep drain to gravity.   Elevated troponin: Could be related to demand ischemia. There was some concern about A. fib versus artifact reviewed by cardiology on admission.  Plan to continue to monitor  Bladder mass: Concerning for bladder carcinoma. Thought to be not a candidate for surgery. Follow with Dr. Diona Fanti  Hypokalemia: Hypomagnesemia: resolved.   COPD: Stable continue with home inhaler  Hypothyroidism: Continue with Synthroid  Chronic diastolic heart failure: hold lasix due to soft bp  Pulmonary  hypertension: Continue with macitentan and sildenafil  Hypotension: Intermittently respond to IV fluids On midodrine at home, continue.   Bradycardia; asymptomatic.   Pressure Injury 09/08/20 Buttocks Left Stage 2 -  Partial thickness loss of dermis presenting as a shallow open injury with a red, pink wound bed without slough. (Active)  09/08/20 1000  Location: Buttocks  Location Orientation: Left  Staging: Stage 2 -  Partial thickness loss of dermis presenting as a shallow open injury with a red, pink  wound bed without slough.  Wound Description (Comments):   Present on Admission: Yes     Estimated body mass index is 24.59 kg/m as calculated from the following:   Height as of this encounter: 5\' 7"  (1.702 m).    Weight as of this encounter: 71.2 kg.   DVT prophylaxis: lovenox Code Status: DNR Family Communication: daughter over phone.  Disposition Plan:  Status is: Inpatient  Remains inpatient appropriate because:IV treatments appropriate due to intensity of illness or inability to take PO   Dispo: The patient is from: Home              Anticipated d/c is to: SNF              Anticipated d/c date is: 1 day              Patient currently is medically stable to d/c.   Difficult to place patient No        Consultants:   none  Procedures:   none  Antimicrobials:  Zosyn   Subjective: He is feeling better.  He uses BIPAP at home. I have order it for Bed time/  He is breathing better, feeling better.   Objective: Vitals:   11/16/20 0003 11/16/20 0107 11/16/20 0443 11/16/20 0824  BP: (!) 89/57  (!) 88/64 121/62  Pulse: (!) 50  (!) 43 72  Resp: 19  20 20   Temp: 97.6 F (36.4 C)  97.7 F (36.5 C) (!) 97.4 F (36.3 C)  TempSrc: Oral  Oral Oral  SpO2: 98% 97% 97% 97%  Weight: 71.2 kg     Height:        Intake/Output Summary (Last 24 hours) at 11/16/2020 1551 Last data filed at 11/16/2020 1511 Gross per 24 hour  Intake 1372.45 ml  Output 800 ml  Net 572.45 ml   Filed Weights   11/15/20 0033 11/15/20 0500 11/16/20 0003  Weight: 68.5 kg 68.5 kg 71.2 kg    Examination:  General exam: NAD Respiratory system: B/L crackles.  Cardiovascular system: S 1, S 2  RRR Gastrointestinal system: BS present, soft, JP drain in place right quadrant.  Central nervous system: alert Extremities: no edema   Data Reviewed: I have personally reviewed following labs and imaging studies  CBC: Recent Labs  Lab 11/13/20 2234 11/15/20 0621 11/16/20 0314  WBC 14.6* 10.4 13.3*  NEUTROABS 13.1* 9.3* 11.8*  HGB 14.3 10.2* 10.5*  HCT 43.3 30.9* 32.4*  MCV 94.3 96.3 97.0  PLT 322 239 893   Basic Metabolic Panel: Recent Labs  Lab 11/13/20 2234 11/14/20 0409 11/14/20 0848  11/15/20 0621 11/16/20 0314  NA 132*  --  132* 131* 131*  K 2.8*  --  3.2* 3.2* 4.4  CL 91*  --  93* 98 99  CO2 23  --  24 21* 22  GLUCOSE 95  --  87 148* 140*  BUN 26*  --  25* 28* 34*  CREATININE 1.16  --  1.23 1.12 1.11  CALCIUM 8.3*  --  7.8* 8.0* 8.3*  MG  --  1.6*  --   --  3.1*   GFR: Estimated Creatinine Clearance: 37.2 mL/min (by C-G formula based on SCr of 1.11 mg/dL). Liver Function Tests: Recent Labs  Lab 11/13/20 2234 11/14/20 0848 11/15/20 0621 11/16/20 0314  AST 29 28 20 22   ALT 20 16 14 16   ALKPHOS 98 78 59 59  BILITOT 1.3* 1.5* 1.0 0.9  PROT 8.2* 7.0 6.0* 6.3*  ALBUMIN 3.4* 2.9* 2.4* 2.5*   No results for input(s): LIPASE, AMYLASE in the last 168 hours. No results for input(s): AMMONIA in the last 168 hours. Coagulation Profile: Recent Labs  Lab 11/13/20 2234  INR 1.1   Cardiac Enzymes: No results for input(s): CKTOTAL, CKMB, CKMBINDEX, TROPONINI in the last 168 hours. BNP (last 3 results) No results for input(s): PROBNP in the last 8760 hours. HbA1C: No results for input(s): HGBA1C in the last 72 hours. CBG: No results for input(s): GLUCAP in the last 168 hours. Lipid Profile: No results for input(s): CHOL, HDL, LDLCALC, TRIG, CHOLHDL, LDLDIRECT in the last 72 hours. Thyroid Function Tests: No results for input(s): TSH, T4TOTAL, FREET4, T3FREE, THYROIDAB in the last 72 hours. Anemia Panel: Recent Labs    11/14/20 0409  FERRITIN 69   Sepsis Labs: Recent Labs  Lab 11/13/20 2234 11/13/20 2335 11/14/20 0409  PROCALCITON  --   --  1.69  LATICACIDVEN 1.9 1.6  --     Recent Results (from the past 240 hour(s))  Culture, blood (Routine x 2)     Status: None (Preliminary result)   Collection Time: 11/13/20 10:34 PM   Specimen: BLOOD  Result Value Ref Range Status   Specimen Description BLOOD SITE NOT SPECIFIED  Final   Special Requests   Final    BOTTLES DRAWN AEROBIC AND ANAEROBIC Blood Culture adequate volume   Culture   Final    NO  GROWTH 2 DAYS Performed at [facility], [place] [address], [place], [place] [address]    Report Status PENDING  Incomplete  Culture, blood (Routine x 2)     Status: None (Preliminary result)   Collection Time: 11/13/20 10:34 PM   Specimen: BLOOD  Result Value Ref Range Status   Specimen Description BLOOD SITE NOT SPECIFIED  Final   Special Requests   Final    BOTTLES DRAWN AEROBIC AND ANAEROBIC Blood Culture adequate volume   Culture   Final    NO GROWTH 3 DAYS Performed at [facility], [address] [place], [place] [address]    Report Status PENDING  Incomplete  SARS Coronavirus 2 by RT PCR (hospital order, performed in [facility] hospital lab) Nasopharyngeal Nasopharyngeal Swab     Status: Abnormal   Collection Time: 11/13/20 11:27 PM   Specimen: Nasopharyngeal Swab  Result Value Ref Range Status   SARS Coronavirus 2 POSITIVE (A) NEGATIVE Final    Comment: RESULT CALLED TO, READ BACK BY AND VERIFIED WITH: RN DAVIDSON ALEXIS BY MESSA H. AT [id number] ON 11/14/2020 (NOTE) SARS-CoV-2 target nucleic acids are DETECTED  SARS-CoV-2 RNA is generally detectable in upper respiratory specimens  during the acute phase of infection.  Positive results are indicative  of the presence of the identified virus, but do not rule out bacterial infection or co-infection with other pathogens not detected by the test.  Clinical correlation with patient history and  other diagnostic information is necessary to determine patient infection status.  The expected result is negative.  Fact Sheet for Patients:   StrictlyIdeas.no   Fact Sheet for Healthcare Providers:   BankingDealers.co.za    This test is not yet approved or cleared by the [place] FDA and  has been authorized for detection and/or diagnosis of SARS-CoV-2 by FDA under an Emergency Use Authorization (EUA).  This EUA will remain in effect  (meaning this test can be used) for the  duration of  the COVID-19 declaration under Section 564(b)(1) of the  Act, 21 U.S.C. section 360-bbb-3(b)(1), unless the authorization is terminated or revoked sooner.  Performed at [facility], [place] [address] [place], [place] [address]          Radiology Studies: No results found.      Scheduled Meds: . vitamin C  500 mg Oral Daily  . Chlorhexidine Gluconate Cloth  6 each Topical Daily  . cholecalciferol  1,000 Units Oral Daily  . enoxaparin (LOVENOX) injection  40 mg Subcutaneous Q24H  . levothyroxine  100 mcg Oral Q0600  . macitentan  10 mg Oral Daily  . methylPREDNISolone (SOLU-MEDROL) injection  0.5 mg/kg Intravenous Q12H   Followed by  . [START ON 11/17/2020] predniSONE  50 mg Oral Daily  . midodrine  15 mg Oral TID PC  . mometasone-formoterol  2 puff Inhalation BID  . montelukast  10 mg Oral QHS  . pantoprazole  20 mg Oral BID  . sildenafil  40 mg Oral TID  . simvastatin  10 mg Oral QHS  . zinc sulfate  220 mg Oral Daily   Continuous Infusions: . piperacillin-tazobactam (ZOSYN)  IV 3.375 g (11/16/20 1432)  . remdesivir 100 mg in NS 100 mL 100 mg (11/16/20 0958)     LOS: 2 days    Time spent: 35 minutes.     Elmarie Shiley, MD [facility]   If 7PM-7AM, please contact night-coverage [email or web address]  11/16/2020, 3:51 PM

## 2020-11-17 LAB — COMPREHENSIVE METABOLIC PANEL
ALT: 28 U/L (ref 0–44)
AST: 32 U/L (ref 15–41)
Albumin: 2.4 g/dL — ABNORMAL LOW (ref 3.5–5.0)
Alkaline Phosphatase: 84 U/L (ref 38–126)
Anion gap: 8 (ref 5–15)
BUN: 34 mg/dL — ABNORMAL HIGH (ref 8–23)
CO2: 23 mmol/L (ref 22–32)
Calcium: 8.2 mg/dL — ABNORMAL LOW (ref 8.9–10.3)
Chloride: 100 mmol/L (ref 98–111)
Creatinine, Ser: 1.2 mg/dL (ref 0.61–1.24)
GFR, Estimated: 56 mL/min — ABNORMAL LOW (ref >60)
Glucose, Bld: 132 mg/dL — ABNORMAL HIGH (ref 70–99)
Potassium: 5 mmol/L (ref 3.5–5.1)
Sodium: 131 mmol/L — ABNORMAL LOW (ref 135–145)
Total Bilirubin: 1.2 mg/dL (ref 0.3–1.2)
Total Protein: 6.2 g/dL — ABNORMAL LOW (ref 6.5–8.1)

## 2020-11-17 LAB — CBC WITH DIFFERENTIAL/PLATELET
Abs Immature Granulocytes: 0.06 10*3/uL (ref 0.00–0.07)
Basophils Absolute: 0 10*3/uL (ref 0.0–0.1)
Basophils Relative: 0 %
Eosinophils Absolute: 0 10*3/uL (ref 0.0–0.5)
Eosinophils Relative: 0 %
HCT: 31.7 % — ABNORMAL LOW (ref 39.0–52.0)
Hemoglobin: 10.9 g/dL — ABNORMAL LOW (ref 13.0–17.0)
Immature Granulocytes: 1 %
Lymphocytes Relative: 6 %
Lymphs Abs: 0.7 10*3/uL (ref 0.7–4.0)
MCH: 32.9 pg (ref 26.0–34.0)
MCHC: 34.4 g/dL (ref 30.0–36.0)
MCV: 95.8 fL (ref 80.0–100.0)
Monocytes Absolute: 0.4 10*3/uL (ref 0.1–1.0)
Monocytes Relative: 4 %
Neutro Abs: 10.2 10*3/uL — ABNORMAL HIGH (ref 1.7–7.7)
Neutrophils Relative %: 89 %
Platelets: 290 10*3/uL (ref 150–400)
RBC: 3.31 MIL/uL — ABNORMAL LOW (ref 4.22–5.81)
RDW: 14.8 % (ref 11.5–15.5)
WBC: 11.4 10*3/uL — ABNORMAL HIGH (ref 4.0–10.5)
nRBC: 0.2 % (ref 0.0–0.2)

## 2020-11-17 LAB — C-REACTIVE PROTEIN: CRP: 5.9 mg/dL — ABNORMAL HIGH (ref <1.0)

## 2020-11-17 LAB — D-DIMER, QUANTITATIVE: D-Dimer, Quant: 1.37 ug/mL-FEU — ABNORMAL HIGH (ref 0.00–0.50)

## 2020-11-17 MED ORDER — TORSEMIDE 20 MG PO TABS
20.0000 mg | ORAL_TABLET | Freq: Every day | ORAL | Status: DC
  Administered 2020-11-17 – 2020-11-18 (×2): 20 mg via ORAL
  Filled 2020-11-17 (×2): qty 1

## 2020-11-17 MED ORDER — AMOXICILLIN-POT CLAVULANATE 500-125 MG PO TABS
1.0000 | ORAL_TABLET | Freq: Two times a day (BID) | ORAL | Status: DC
  Administered 2020-11-17 – 2020-11-18 (×3): 500 mg via ORAL
  Filled 2020-11-17 (×4): qty 1

## 2020-11-17 NOTE — Progress Notes (Signed)
Pharmacy Consult for Pulmonary Hypertension Treatment   Indication - Continuation of prior to admission medication   Patient is 87 y.o.  with history of PAH on chronic Macitentan (Opsumit) PTA and will be continued while hospitalized.   Continuing this medication order as an inpatient requires that monitoring parameters per REMS requirements must be met.  1. Chronic therapy is under the supervision of Dr Dan Bensimhon who is enrolled in the REMS program and is being notified of continuation of therapy. A staff message in EPIC has been sent notifying the certified prescriber.  2. Per patient report has previously been educated on Hepatotoxicity . On admission pregnancy risk has been assessed and no monitoring required. 3. Hepatic function has been evaluated. AST/ALT appropriate to continue medication at this time.  Hepatic Function Latest Ref Rng & Units 11/17/2020 11/16/2020 11/15/2020  Total Protein 6.5 - 8.1 g/dL 6.2(L) 6.3(L) 6.0(L)  Albumin 3.5 - 5.0 g/dL 2.4(L) 2.5(L) 2.4(L)  AST 15 - 41 U/L 32 22 20  ALT 0 - 44 U/L 28 16 14  Alk Phosphatase 38 - 126 U/L 84 59 59  Total Bilirubin 0.3 - 1.2 mg/dL 1.2 0.9 1.0  Bilirubin, Direct 0.0 - 0.2 mg/dL - - -    If any question arise or pregnancy is identified during hospitalization, contact for bosentan and macitentan: [phone]; ambrisentan: [phone].  Thank for you allowing us to participate in the care of this patient.  Kimberly Hurth, PharmD, BCCCP Clinical Pharmacist  Phone: [phone] 11/17/2020 8:14 AM  Please check AMION for all [facility] Pharmacy phone numbers After 10:00 PM, call Main Pharmacy [phone] 

## 2020-11-17 NOTE — Progress Notes (Signed)
Interventional Radiology Brief Note:  Vincent Robinson is a 87 year old male known to IR from percutaneous cholecystostomy tube placement 09/08/20.  He returned to IR 10/06/20 for routine assessment and his cystic duct was found to be patent.  He underwent capping trial, however at subsequent follow-up 10/26/20 was found to likely have intermittent obstruction with mixed tolerance of the capping trial.  He was therefore returned to gravity drainage and routine follow-up was scheduled for drain exchange.  In the interim, patient indicated he had follow-up planned with surgical team to discuss cholecystectomy.   IR consulted for assessment of drain.  Reviewed case with Dr. Earleen Newport who has reviewed imaging.  Output trend continues to remain moderately high--200 mL yesterday, 75 mL so far today.  Per RN, no concerns with tube at this time.  Functioning well.   No plans for early injection at this time.  Patient may keep his 11/28/20 appointment with IR as long as he is recovered from COVID infection.  Will place order for flushes daily.   Ordering MD aware.  Brynda Greathouse, MS RD PA-C 12:50 PM

## 2020-11-17 NOTE — Progress Notes (Signed)
Occupational Therapy Treatment Patient Details Name: Vincent Robinson MRN: [id number] DOB: Jan 21, 1923 Today's Date: 11/17/2020    History of present illness 87 y.o. male with medical history significant of asthma, COPD, chronic respiratory failure on home oxygen, CAD, hyperlipidemia, hypothyroidism, chronic diastolic heart failure, pulmonary hypertension, hospital admission in November 2021 for septic shock secondary to acute cholecystitis status post percutaneous gallbladder drain placement presenting to the ED via EMS for evaluation of shortness of breath.  EMS reported oxygen saturation of 70% on 5 L home oxygen and his fingers were blue.  He was found to be positive for COVID-19.  Leukocytosis, hypokalemia, BNP 299, chest x-ray with cardiac enlargement and pulmonary vascular congestion.  CTA was negative for PE but did show bilateral lower lobe airspace consolidations concerning for pneumonia or aspiration.   OT comments  Patient continues to make incremental gains towards OT/patient stated goals.  He continues to desat to 86% with attempts to decrease O2 L to prior level of 2L.  Patient quickly rebounds to 93% with O2 at its current level.  He has decided to go to SNF, stating he has to get stronger, before he can return home.  Patient understands his daughter cannot assist him currently.  OT will continue to follow in the acute setting, to assist with eventual transition to SNF when cleared medically.    Follow Up Recommendations  SNF    Equipment Recommendations  None recommended by OT    Recommendations for Other Services      Precautions / Restrictions Precautions Precautions: Fall Precaution Comments: COVID; watch Spo2 Restrictions Weight Bearing Restrictions: No       Mobility Bed Mobility Overal bed mobility: Needs Assistance Bed Mobility: Sit to Supine       Sit to supine: Supervision      Transfers Overall transfer level: Needs assistance Equipment used:  None Transfers: Sit to/from Stand Sit to Stand: Min guard              Balance   Sitting-balance support: No upper extremity supported;Feet supported Sitting balance-Leahy Scale: Good     Standing balance support: Single extremity supported;During functional activity Standing balance-Leahy Scale: Poor                             ADL either performed or assessed with clinical judgement   ADL       Grooming: Set up;Min guard;Standing;Wash/dry hands;Wash/dry face;Oral care;Brushing hair                               Functional mobility during ADLs: Min guard General ADL Comments: 2 LOB noted with treatment.                       Cognition Arousal/Alertness: Awake/alert Behavior During Therapy: WFL for tasks assessed/performed Overall Cognitive Status: Within Functional Limits for tasks assessed                                          Exercises     Shoulder Instructions       General Comments continues on 7 L, nursing advised they are trying to wean O2 down to his premorbid level of 2 L.  he continues to desat with any attempts.    Pertinent Vitals/ Pain  Pain Assessment: No/denies pain Pain Intervention(s): Monitored during session                                                          Frequency  Min 2X/week        Progress Toward Goals  OT Goals(current goals can now be found in the care plan section)  Progress towards OT goals: Progressing toward goals  Acute Rehab OT Goals Patient Stated Goal: I need to get stronger before I go back home. OT Goal Formulation: With patient Time For Goal Achievement: 11/29/20 Potential to Achieve Goals: Good  Plan Discharge plan needs to be updated    Co-evaluation                 AM-PAC OT "6 Clicks" Daily Activity     Outcome Measure   Help from another person eating meals?: None Help from another person taking care  of personal grooming?: A Little Help from another person toileting, which includes using toliet, bedpan, or urinal?: A Little Help from another person bathing (including washing, rinsing, drying)?: A Little Help from another person to put on and taking off regular upper body clothing?: A Little Help from another person to put on and taking off regular lower body clothing?: A Lot 6 Click Score: 18    End of Session Equipment Utilized During Treatment: Oxygen;Gait belt  OT Visit Diagnosis: Unsteadiness on feet (R26.81);Other abnormalities of gait and mobility (R26.89);Muscle weakness (generalized) (M62.81)   Activity Tolerance Patient tolerated treatment well   Patient Left in bed;with call bell/phone within reach   Nurse Communication Mobility status        Time: 1341-1403 OT Time Calculation (min): 22 min  Charges: OT General Charges $OT Visit: 1 Visit OT Treatments $Self Care/Home Management : 8-22 mins  11/17/2020  Rich, OTR/L  Acute Rehabilitation Services  Office:  [phone]    Metta Clines 11/17/2020, 2:21 PM

## 2020-11-17 NOTE — Progress Notes (Addendum)
PROGRESS NOTE    ESTEN DOLLAR  RWE:[id number] DOB: [birth year] DOA: 11/13/2020 PCP: Katherina Mires, MD   Brief Narrative: 87 year old with past medical history significant for asthma, COPD, chronic respiratory failure on home oxygen, CAD, hyperlipidemia, hypothyroidism, chronic diastolic heart failure, pulmonary hypertension, hospital admission November 2021 for septic shock secondary to acute cholecystitis status post percutaneous gallbladder drain placement presented to the ED for evaluation of shortness of breath. EMS reported oxygen saturation 70% on 5 L home oxygen.  Patient was tachypneic and labored breathing.  Patient was placed on nonrebreather with improvement of his oxygen saturation. See if 2 doses of Covid vaccine, he did not receive a booster shot yet.  He has a history of bladder cancer for which he is seen by Dr. Diona Fanti who discussed removing the tumor but his cardiology felt that the surgery was not safe for him. Patient was found to be positive for COVID-19.  Chest x-ray with pulmonary vascular congestion.  CTA was negative for PE but did show bilateral lower lobe airspace consolidation concerning for pneumonia and aspiration.  CT abdomen with some concern of questionable early uncomplicated diverticulitis, a stable cholecystostomy tube with decompression of gallbladder.  Patient was a started on Remdesivir , a steroid and Zosyn.    Assessment & Plan:   Principal Problem:   Pneumonia due to COVID-19 virus Active Problems:   COPD (chronic obstructive pulmonary disease) (HCC)   Acute on chronic respiratory failure (HCC)   Elevated troponin   Bladder mass   1-Acute on chronic hypoxic respiratory failure and sepsis secondary to COVID-19 viral pneumonia, possible aspiration pneumonia: Chronically on 4 to 5 L of oxygen at home. CTA negative for PE. Received Remdesivir for 3 days. Received IV steroid for 3 days, now on oral prednisone.  Plan to complete 7 days of  antibiotics.  Position as tolerated. Needs to Quarantine until: 2/12 He is on 5 L HFNC--transition to [place] in anticipation to discharge.  COVID-19 Labs  Recent Labs    11/15/20 0621 11/16/20 0314 11/17/20 0110  DDIMER 1.79* 1.18* 1.37*  CRP 11.4* 8.0* 5.9*    Lab Results  Component Value Date   SARSCOV2NAA POSITIVE (A) 11/13/2020   [place] NEGATIVE 09/08/2020   [place] NEGATIVE 03/22/2020   [place] NEGATIVE 12/18/2019    2-Questionable early Diverticulitis: CT abdomen concerning with early diverticulitis: Treated with IV Zosyn, day 4 . Transition to oral Augmentin to complete 7 days treatment.   History of cholecystitis, status post percutaneous cholecystostomy tube; Continue to drain in place 1/18. Needs drain exchange in 3 to 6 weeks.  Fail capping trial. IR recommend to keep drain to gravity.  He report decreased out put. Discussed with IR tube is in good position on recent CT scan. Amount of out put can be variable, color change is normal.   Elevated troponin: Could be related to demand ischemia. There was some concern about A. fib versus artifact reviewed by cardiology on admission.  Plan to continue to monitor  Bladder mass: Concerning for bladder carcinoma. Thought to be not a candidate for surgery. Follow with Dr. Diona Fanti  Hypokalemia: Hypomagnesemia: resolved.   COPD: Stable continue with home inhaler  Hypothyroidism: Continue with Synthroid  Chronic diastolic heart failure: resume torsemide.   Pulmonary  hypertension: Continue with macitentan and sildenafil  Hypotension: Intermittently respond to IV fluids On midodrine at home, continue.   Bradycardia; asymptomatic.   Pressure Injury 09/08/20 Buttocks Left Stage 2 -  Partial thickness loss of dermis presenting as  a shallow open injury with a red, pink wound bed without slough. (Active)  09/08/20 1000  Location: Buttocks  Location Orientation: Left  Staging: Stage 2 -  Partial thickness  loss of dermis presenting as a shallow open injury with a red, pink wound bed without slough.  Wound Description (Comments):   Present on Admission: Yes     Estimated body mass index is 24.96 kg/m as calculated from the following:   Height as of this encounter: 5\' 7"  (1.702 m).   Weight as of this encounter: 72.3 kg.   DVT prophylaxis: lovenox Code Status: DNR Family Communication:  Disposition Plan:  Status is: Inpatient  Remains inpatient appropriate because:IV treatments appropriate due to intensity of illness or inability to take PO   Dispo: The patient is from: Home              Anticipated d/c is to: SNF              Anticipated d/c date is: 1 day              Patient currently is medically stable to d/c.   Difficult to place patient No        Consultants:   none  Procedures:   none  Antimicrobials:  Zosyn   Subjective: Denies dyspnea. He got CPAP machine last night. I discussed with his nurse that he needs BIPAP.  He think cholecystostomy drain is draining less amount.   Objective: Vitals:   11/17/20 0026 11/17/20 0327 11/17/20 0800 11/17/20 1049  BP: (!) 89/56 (!) 93/53 99/63 (!) 105/51  Pulse: 60 (!) 53 69 (!) 46  Resp: 18 20 20 20   Temp: 97.6 F (36.4 C) 97.8 F (36.6 C) 98.2 F (36.8 C) 98 F (36.7 C)  TempSrc: Oral Oral Oral Oral  SpO2: 99% 99% (!) 88% 95%  Weight:  72.3 kg    Height:        Intake/Output Summary (Last 24 hours) at 11/17/2020 1240 Last data filed at 11/17/2020 1202 Gross per 24 hour  Intake 1247.17 ml  Output 1500 ml  Net -252.83 ml   Filed Weights   11/15/20 0500 11/16/20 0003 11/17/20 0327  Weight: 68.5 kg 71.2 kg 72.3 kg    Examination:  General exam: NAD Respiratory system; B/L crackles.   Cardiovascular system: S 1, S 2 RRR Gastrointestinal system: BS present, soft, nt  JP drain in place right quadrant.  Central nervous system: Alert, following command Extremities: No edema   Data Reviewed: I have  personally reviewed following labs and imaging studies  CBC: Recent Labs  Lab 11/13/20 2234 11/15/20 0621 11/16/20 0314 11/17/20 0110  WBC 14.6* 10.4 13.3* 11.4*  NEUTROABS 13.1* 9.3* 11.8* 10.2*  HGB 14.3 10.2* 10.5* 10.9*  HCT 43.3 30.9* 32.4* 31.7*  MCV 94.3 96.3 97.0 95.8  PLT 322 239 273 [id number]   Basic Metabolic Panel: Recent Labs  Lab 11/13/20 2234 11/14/20 0409 11/14/20 0848 11/15/20 0621 11/16/20 0314 11/17/20 0110  NA 132*  --  132* 131* 131* 131*  K 2.8*  --  3.2* 3.2* 4.4 5.0  CL 91*  --  93* 98 99 100  CO2 23  --  24 21* 22 23  GLUCOSE 95  --  87 148* 140* 132*  BUN 26*  --  25* 28* 34* 34*  CREATININE 1.16  --  1.23 1.12 1.11 1.20  CALCIUM 8.3*  --  7.8* 8.0* 8.3* 8.2*  MG  --  1.6*  --   --  3.1*  --    GFR: Estimated Creatinine Clearance: 34.4 mL/min (by C-G formula based on SCr of 1.2 mg/dL). Liver Function Tests: Recent Labs  Lab 11/13/20 2234 11/14/20 0848 11/15/20 0621 11/16/20 0314 11/17/20 0110  AST 29 28 20 22  32  ALT 20 16 14 16 28   ALKPHOS 98 78 59 59 84  BILITOT 1.3* 1.5* 1.0 0.9 1.2  PROT 8.2* 7.0 6.0* 6.3* 6.2*  ALBUMIN 3.4* 2.9* 2.4* 2.5* 2.4*   No results for input(s): LIPASE, AMYLASE in the last 168 hours. No results for input(s): AMMONIA in the last 168 hours. Coagulation Profile: Recent Labs  Lab 11/13/20 2234  INR 1.1   Cardiac Enzymes: No results for input(s): CKTOTAL, CKMB, CKMBINDEX, TROPONINI in the last 168 hours. BNP (last 3 results) No results for input(s): PROBNP in the last 8760 hours. HbA1C: No results for input(s): HGBA1C in the last 72 hours. CBG: No results for input(s): GLUCAP in the last 168 hours. Lipid Profile: No results for input(s): CHOL, HDL, LDLCALC, TRIG, CHOLHDL, LDLDIRECT in the last 72 hours. Thyroid Function Tests: No results for input(s): TSH, T4TOTAL, FREET4, T3FREE, THYROIDAB in the last 72 hours. Anemia Panel: No results for input(s): VITAMINB12, FOLATE, FERRITIN, TIBC, IRON,  RETICCTPCT in the last 72 hours. Sepsis Labs: Recent Labs  Lab 11/13/20 2234 11/13/20 2335 11/14/20 0409  PROCALCITON  --   --  1.69  LATICACIDVEN 1.9 1.6  --     Recent Results (from the past 240 hour(s))  Culture, blood (Routine x 2)     Status: None (Preliminary result)   Collection Time: 11/13/20 10:34 PM   Specimen: BLOOD  Result Value Ref Range Status   Specimen Description BLOOD SITE NOT SPECIFIED  Final   Special Requests   Final    BOTTLES DRAWN AEROBIC AND ANAEROBIC Blood Culture adequate volume   Culture   Final    NO GROWTH 2 DAYS Performed at [facility], [address] [place], [place] [address]    Report Status PENDING  Incomplete  Culture, blood (Routine x 2)     Status: None (Preliminary result)   Collection Time: 11/13/20 10:34 PM   Specimen: BLOOD  Result Value Ref Range Status   Specimen Description BLOOD SITE NOT SPECIFIED  Final   Special Requests   Final    BOTTLES DRAWN AEROBIC AND ANAEROBIC Blood Culture adequate volume   Culture   Final    NO GROWTH 3 DAYS Performed at [facility], [address], [place], [place] [address]    Report Status PENDING  Incomplete  SARS Coronavirus 2 by RT PCR (hospital order, performed in [facility] hospital lab) Nasopharyngeal Nasopharyngeal Swab     Status: Abnormal   Collection Time: 11/13/20 11:27 PM   Specimen: Nasopharyngeal Swab  Result Value Ref Range Status   SARS Coronavirus 2 POSITIVE (A) NEGATIVE Final    Comment: RESULT CALLED TO, READ BACK BY AND VERIFIED WITH: RN DAVIDSON ALEXIS BY MESSA H. AT 8250 ON 11/14/2020 (NOTE) SARS-CoV-2 target nucleic acids are DETECTED  SARS-CoV-2 RNA is generally detectable in upper respiratory specimens  during the acute phase of infection.  Positive results are indicative  of the presence of the identified virus, but do not rule out bacterial infection or co-infection with other pathogens not detected by the test.  Clinical correlation with patient  history and  other diagnostic information is necessary to determine patient infection status.  The expected result is negative.  Fact  Sheet for Patients:   StrictlyIdeas.no   Fact Sheet for Healthcare Providers:   BankingDealers.co.za    This test is not yet approved or cleared by the [place] FDA and  has been authorized for detection and/or diagnosis of SARS-CoV-2 by FDA under an Emergency Use Authorization (EUA).  This EUA will remain in effect  (meaning this test can be used) for the duration of  the COVID-19 declaration under Section 564(b)(1) of the Act, 21 U.S.C. section 360-bbb-3(b)(1), unless the authorization is terminated or revoked sooner.  Performed at [facility], [place] [address] [place], [place] [address]          Radiology Studies: No results found.      Scheduled Meds: . amoxicillin-clavulanate  1 tablet Oral BID  . vitamin C  500 mg Oral Daily  . Chlorhexidine Gluconate Cloth  6 each Topical Daily  . cholecalciferol  1,000 Units Oral Daily  . enoxaparin (LOVENOX) injection  40 mg Subcutaneous Q24H  . levothyroxine  100 mcg Oral Q0600  . macitentan  10 mg Oral Daily  . midodrine  15 mg Oral TID PC  . mometasone-formoterol  2 puff Inhalation BID  . montelukast  10 mg Oral QHS  . pantoprazole  20 mg Oral BID  . predniSONE  50 mg Oral Daily  . sildenafil  40 mg Oral TID  . simvastatin  10 mg Oral QHS  . torsemide  20 mg Oral Daily  . zinc sulfate  220 mg Oral Daily   Continuous Infusions: . remdesivir 100 mg in NS 100 mL 100 mg (11/17/20 1041)     LOS: 3 days    Time spent: 35 minutes.     Elmarie Shiley, MD [facility]   If 7PM-7AM, please contact night-coverage [email or web address]  11/17/2020, 12:40 PM

## 2020-11-17 NOTE — Progress Notes (Signed)
Physical Therapy Treatment Patient Details Name: Vincent Robinson MRN: [id number] DOB: 1923/02/06 Today's Date: 11/17/2020    History of Present Illness 87 y.o. male with medical history significant of asthma, COPD, chronic respiratory failure on home oxygen, CAD, hyperlipidemia, hypothyroidism, chronic diastolic heart failure, pulmonary hypertension, hospital admission in November 2021 for septic shock secondary to acute cholecystitis status post percutaneous gallbladder drain placement presenting to the ED via EMS for evaluation of shortness of breath.  EMS reported oxygen saturation of 70% on 5 L home oxygen and his fingers were blue.  He was found to be positive for COVID-19.  Leukocytosis, hypokalemia, BNP 299, chest x-ray with cardiac enlargement and pulmonary vascular congestion.  CTA was negative for PE but did show bilateral lower lobe airspace consolidations concerning for pneumonia or aspiration.    PT Comments    Pt was able to progress OOB to restroom and required min guard to stand for peri care.  On return to recliner chair SpO2 at 87%, however quickly returned to 92% with rest. Pt performed sit<>stand and seated ther ex. Pt's gait is very unsteady at this time with 1x LOB when turning around at toilet and pt reaching for support from counter and furniture. May benefit from gait training with RW next session.    Follow Up Recommendations  Home health PT;Supervision for mobility/OOB     Equipment Recommendations  None recommended by PT    Recommendations for Other Services       Precautions / Restrictions Precautions Precautions: Fall Precaution Comments: COVID; watch Spo2    Mobility  Bed Mobility Overal bed mobility: Needs Assistance Bed Mobility: Supine to Sit     Supine to sit: Supervision;HOB elevated     General bed mobility comments: OOB in chair  Transfers Overall transfer level: Needs assistance Equipment used: None Transfers: Sit to/from Stand Sit to  Stand: Min guard         General transfer comment: Very close min guard to rise  Ambulation/Gait Ambulation/Gait assistance: Min guard Gait Distance (Feet): 15 Feet (x2) Assistive device: None Gait Pattern/deviations: Decreased stride length;Trunk flexed;Step-to pattern;Shuffle Gait velocity: decreased   General Gait Details: Would benefit from HHA, however was moving quickly to restroom and not allowing PTA to provide HHA due to some urgency. Utalized Psychologist, occupational for support.   Stairs             Wheelchair Mobility    Modified Rankin (Stroke Patients Only)       Balance Overall balance assessment: Needs assistance Sitting-balance support: No upper extremity supported;Feet supported Sitting balance-Leahy Scale: Good     Standing balance support: No upper extremity supported;Single extremity supported;During functional activity Standing balance-Leahy Scale: Fair                              Cognition Arousal/Alertness: Awake/alert Behavior During Therapy: WFL for tasks assessed/performed Overall Cognitive Status: Within Functional Limits for tasks assessed                                 General Comments: Slightly impulsive and HOH      Exercises General Exercises - Lower Extremity Ankle Circles/Pumps: AROM;Both;15 reps Long Arc Quad: AROM;Both;10 reps;Seated Hip ABduction/ADduction: AROM;Both;10 reps;Seated Other Exercises Other Exercises: 5x sit<>stand    General Comments General comments (skin integrity, edema, etc.): on 7L on arrival to room with SpO2  at 96%, dipping to 87% with activity and quickly recovering with rest.      Pertinent Vitals/Pain Pain Assessment: No/denies pain    Home Living                      Prior Function            PT Goals (current goals can now be found in the care plan section) Acute Rehab PT Goals Patient Stated Goal: Go home PT Goal Formulation: With  patient Time For Goal Achievement: 11/29/20 Potential to Achieve Goals: Good    Frequency    Min 3X/week      PT Plan Current plan remains appropriate    Co-evaluation              AM-PAC PT "6 Clicks" Mobility   Outcome Measure  Help needed turning from your back to your side while in a flat bed without using bedrails?: None Help needed moving from lying on your back to sitting on the side of a flat bed without using bedrails?: None Help needed moving to and from a bed to a chair (including a wheelchair)?: A Little Help needed standing up from a chair using your arms (e.g., wheelchair or bedside chair)?: A Little Help needed to walk in hospital room?: A Little Help needed climbing 3-5 steps with a railing? : A Lot 6 Click Score: 19    End of Session Equipment Utilized During Treatment: Oxygen Activity Tolerance: Patient tolerated treatment well Patient left: in chair;with call bell/phone within reach;with chair alarm set Nurse Communication: Mobility status PT Visit Diagnosis: Unsteadiness on feet (R26.81);Difficulty in walking, not elsewhere classified (R26.2)     Time: 6213-0865 PT Time Calculation (min) (ACUTE ONLY): 36 min  Charges:  $Gait Training: 8-22 mins $Therapeutic Exercise: 8-22 mins                    Benjiman Core, [place] Pager [phone] Acute Rehab    Allena Katz 11/17/2020, 11:09 AM

## 2020-11-18 LAB — COMPREHENSIVE METABOLIC PANEL
ALT: 33 U/L (ref 0–44)
AST: 30 U/L (ref 15–41)
Albumin: 2.4 g/dL — ABNORMAL LOW (ref 3.5–5.0)
Alkaline Phosphatase: 49 U/L (ref 38–126)
Anion gap: 9 (ref 5–15)
BUN: 35 mg/dL — ABNORMAL HIGH (ref 8–23)
CO2: 24 mmol/L (ref 22–32)
Calcium: 8.1 mg/dL — ABNORMAL LOW (ref 8.9–10.3)
Chloride: 101 mmol/L (ref 98–111)
Creatinine, Ser: 1.16 mg/dL (ref 0.61–1.24)
GFR, Estimated: 58 mL/min — ABNORMAL LOW (ref >60)
Glucose, Bld: 109 mg/dL — ABNORMAL HIGH (ref 70–99)
Potassium: 4.8 mmol/L (ref 3.5–5.1)
Sodium: 134 mmol/L — ABNORMAL LOW (ref 135–145)
Total Bilirubin: 0.8 mg/dL (ref 0.3–1.2)
Total Protein: 5.8 g/dL — ABNORMAL LOW (ref 6.5–8.1)

## 2020-11-18 LAB — CBC WITH DIFFERENTIAL/PLATELET
Abs Immature Granulocytes: 0.08 10*3/uL — ABNORMAL HIGH (ref 0.00–0.07)
Basophils Absolute: 0 10*3/uL (ref 0.0–0.1)
Basophils Relative: 0 %
Eosinophils Absolute: 0 10*3/uL (ref 0.0–0.5)
Eosinophils Relative: 0 %
HCT: 31.5 % — ABNORMAL LOW (ref 39.0–52.0)
Hemoglobin: 10.8 g/dL — ABNORMAL LOW (ref 13.0–17.0)
Immature Granulocytes: 1 %
Lymphocytes Relative: 11 %
Lymphs Abs: 1 10*3/uL (ref 0.7–4.0)
MCH: 32.8 pg (ref 26.0–34.0)
MCHC: 34.3 g/dL (ref 30.0–36.0)
MCV: 95.7 fL (ref 80.0–100.0)
Monocytes Absolute: 0.8 10*3/uL (ref 0.1–1.0)
Monocytes Relative: 9 %
Neutro Abs: 7.3 10*3/uL (ref 1.7–7.7)
Neutrophils Relative %: 79 %
Platelets: 258 10*3/uL (ref 150–400)
RBC: 3.29 MIL/uL — ABNORMAL LOW (ref 4.22–5.81)
RDW: 14.7 % (ref 11.5–15.5)
WBC: 9.2 10*3/uL (ref 4.0–10.5)
nRBC: 0.2 % (ref 0.0–0.2)

## 2020-11-18 LAB — D-DIMER, QUANTITATIVE: D-Dimer, Quant: 3.28 ug/mL-FEU — ABNORMAL HIGH (ref 0.00–0.50)

## 2020-11-18 LAB — CULTURE, BLOOD (ROUTINE X 2)
Culture: NO GROWTH
Special Requests: ADEQUATE

## 2020-11-18 LAB — C-REACTIVE PROTEIN: CRP: 3.6 mg/dL — ABNORMAL HIGH (ref <1.0)

## 2020-11-18 MED ORDER — ZINC SULFATE 220 (50 ZN) MG PO CAPS: 220.0000 mg | ORAL_CAPSULE | Freq: Every day | ORAL | 0 refills | Status: DC

## 2020-11-18 MED ORDER — PREDNISONE 50 MG PO TABS: 50.0000 mg | ORAL_TABLET | Freq: Every day | ORAL | 0 refills | Status: AC

## 2020-11-18 MED ORDER — AMOXICILLIN-POT CLAVULANATE 500-125 MG PO TABS: 1.0000 | ORAL_TABLET | Freq: Two times a day (BID) | ORAL | 0 refills | Status: AC

## 2020-11-18 MED ORDER — VITAMIN D3 25 MCG PO TABS: 1000.0000 [IU] | ORAL_TABLET | Freq: Every day | ORAL | 0 refills | Status: DC

## 2020-11-18 MED ORDER — ASCORBIC ACID 500 MG PO TABS: 500.0000 mg | ORAL_TABLET | Freq: Every day | ORAL | 0 refills | Status: DC

## 2020-11-18 NOTE — Progress Notes (Signed)
Pt prepared for discharge. IV removed. Pt belongings packed. Pt dressed. Report called to RN at [facility] to [place], [place]. Will continue to monitor pt until PTAR arrives.

## 2020-11-18 NOTE — TOC Transition Note (Signed)
Transition of Care [facility]) - CM/SW Discharge Note   Patient Details  Name: Vincent Robinson MRN: [id number] Date of Birth: [birth year]  Transition of Care [facility]) CM/SW Contact:  Bethann Berkshire, [place] Phone Number: 11/18/2020, 1:36 PM   Clinical Narrative:     Patient will DC to: [facility] Anticipated DC date: 11/18/20 Family notified: Daughter [name] Transport by: Corey Harold   Per MD patient ready for DC to [facility] . RN, patient, patient's family, and facility notified of DC. Discharge Summary and FL2 sent to facility. RN to call report prior to discharge [phone]). DC packet on chart. Ambulance transport requested for patient.   CSW will sign off for now as social work intervention is no longer needed. Please consult [place] again if new needs arise.   Final next level of care: Skilled Nursing Facility Barriers to Discharge: No Barriers Identified   Patient Goals and CMS Choice Patient states their goals for this hospitalization and ongoing recovery are:: to get stronger CMS Medicare.gov Compare Post Acute Care list provided to::  (adult children) Choice offered to / list presented to : NA  Discharge Placement              Patient [facility] bed at: [facility] Patient to be transferred to facility by: [place] Name of family member notified: Daughter [name] Patient and family notified of of transfer: 11/18/20  Discharge Plan and Services In-house Referral: NA Discharge Planning Services: CM Consult Post Acute Care Choice: [place]: [place] Date [place]: 11/16/20 Time [place]: 9211 Representative spoke with at [place]: [place] Determinants of Health ([place]) Interventions     Readmission Risk Interventions Readmission Risk Prevention Plan 09/19/2020 03/25/2020 11/24/2019  Transportation Screening Complete Complete Complete  PCP or Specialist Appt within 5-7 Days - - Patient  refused  Home Care Screening - - Complete  Medication Review (RN CM) - - Referral to Pharmacy  [place] or Home Care Consult Complete Patient refused -  Social Work Consult for [place] Planning/Counseling Complete - -  [place] Screening Complete Not Applicable -  Medication Review Press photographer) Complete Complete -  Some recent data might be hidden

## 2020-11-18 NOTE — Discharge Summary (Addendum)
Physician Discharge Summary  Vincent Robinson [id number] DOB: 11-Dec-1922 DOA: 11/13/2020  PCP: Katherina Mires, MD  Admit date: 11/13/2020 Discharge date: 11/18/2020  Admitted From: Home  Disposition: SNF  Recommendations for Outpatient Follow-up:  1. Follow up with PCP in 1-2 weeks 2. Please obtain BMP/CBC in one week 3. Needs BIPAP at bedtime.  4. Needs to follow up with IR for follow up on cholecystostomy drain.     Discharge Condition: Stable.  CODE STATUS: Full code Diet recommendation: Heart Healthy  Brief/Interim Summary: 87 year old with past medical history significant for asthma, COPD, chronic respiratory failure on home oxygen, CAD, hyperlipidemia, hypothyroidism, chronic diastolic heart failure, pulmonary hypertension, hospital admission November 2021 for septic shock secondary to acute cholecystitis status post percutaneous gallbladder drain placement presented to the ED for evaluation of shortness of breath. EMS reported oxygen saturation 70% on 5 L home oxygen.  Patient was tachypneic and labored breathing.  Patient was placed on nonrebreather with improvement of his oxygen saturation. See if 2 doses of Covid vaccine, he did not receive a booster shot yet.  He has a history of bladder cancer for which he is seen by Dr. Diona Fanti who discussed removing the tumor but his cardiology felt that the surgery was not safe for him. Patient was found to be positive for COVID-19.  Chest x-ray with pulmonary vascular congestion.  CTA was negative for PE but did show bilateral lower lobe airspace consolidation concerning for pneumonia and aspiration.  CT abdomen with some concern of questionable early uncomplicated diverticulitis, a stable cholecystostomy tube with decompression of gallbladder.  Patient was a started on Remdesivir , a steroid and Zosyn.   1-Acute on chronic hypoxic respiratory failure and sepsis secondary to COVID-19 viral pneumonia, possible aspiration  pneumonia: Chronically on 4 to 5 L of oxygen at home. CTA negative for PE. Received Remdesivir for 3 days. Received IV steroid for 3 days, now on oral prednisone. Plan for 3 more days.  Plan to complete 7 days of antibiotics.  Position as tolerated. Needs to Quarantine until: 2/12 On 5-6 L oxygen [place], oxygen 98 % Stable for discharge to rehab.   COVID-19 Labs  Recent Labs (last 2 labs)        Recent Labs    11/15/20 0621 11/16/20 0314 11/17/20 0110  DDIMER 1.79* 1.18* 1.37*  CRP 11.4* 8.0* 5.9*      Recent Labs       Lab Results  Component Value Date   SARSCOV2NAA POSITIVE (A) 11/13/2020   [place] NEGATIVE 09/08/2020   [place] NEGATIVE 03/22/2020   [place] NEGATIVE 12/18/2019      2-Questionable early Diverticulitis: CT abdomen concerning with early diverticulitis: Treated with IV Zosyn, day 4 . Transition to oral Augmentin to complete 7 days treatment.  2 more days of antibiotics at discharge.   History of cholecystitis, status post percutaneous cholecystostomy tube; Continue to drain in place 1/18. Needs drain exchange in 3 to 6 weeks.  Fail capping trial. IR recommend to keep drain to gravity.  He report decreased out put. Discussed with IR tube is in good position on recent CT scan. Amount of out put can be variable, color change is normal.   Elevated troponin: Could be related to demand ischemia. There was some concern about A. fib versus artifact reviewed by cardiology on admission.  Plan to continue to monitor  Bladder mass: Concerning for bladder carcinoma. Thought to be not a candidate for surgery. Follow with Dr. Diona Fanti  Hypokalemia: Hypomagnesemia: resolved.  COPD: Stable continue with home inhaler  Hypothyroidism: Continue with Synthroid  Chronic diastolic heart failure: resume torsemide.   Pulmonary  hypertension: Continue with macitentan and sildenafil  Hypotension: Intermittently respond to IV fluids On  midodrine at home, continue.   Bradycardia; asymptomatic.  Hyponatremia; received IV fluids.  Stable on diuretics.      Discharge Diagnoses:  Principal Problem:   Pneumonia due to COVID-19 virus Active Problems:   COPD (chronic obstructive pulmonary disease) (HCC)   Acute on chronic respiratory failure (HCC)   Elevated troponin   Bladder mass    Discharge Instructions  Discharge Instructions    Diet - low sodium heart healthy   Complete by: As directed    Increase activity slowly   Complete by: As directed      Allergies as of 11/18/2020   No Known Allergies     Medication List    TAKE these medications   albuterol 108 (90 Base) MCG/ACT inhaler Commonly known as: VENTOLIN HFA Inhale 1 puff into the lungs 2 (two) times daily. What changed:   when to take this  reasons to take this   amoxicillin-clavulanate 500-125 MG tablet Commonly known as: AUGMENTIN Take 1 tablet (500 mg total) by mouth 2 (two) times daily for 2 days.   ascorbic acid 500 MG tablet Commonly known as: VITAMIN C Take 1 tablet (500 mg total) by mouth daily. Start taking on: November 19, 2020   fluticasone 50 MCG/ACT nasal spray Commonly known as: FLONASE Place 1 spray into both nostrils daily as needed for allergies or rhinitis.   Fluticasone-Salmeterol(sensor) 113-14 MCG/ACT Aepb Inhale 1 puff into the lungs 2 (two) times daily.   levothyroxine 100 MCG tablet Commonly known as: SYNTHROID Take 1 tablet (100 mcg total) by mouth daily at 6 (six) AM.   loratadine 10 MG tablet Commonly known as: CLARITIN Take 10 mg by mouth daily as needed for allergies.   midodrine 10 MG tablet Commonly known as: PROAMATINE Take 1.5 tablets (15 mg total) by mouth 3 (three) times daily after meals.   montelukast 10 MG tablet Commonly known as: SINGULAIR Take 10 mg by mouth at bedtime.   Opsumit 10 MG tablet Generic drug: macitentan Take 1 tablet (10 mg total) by mouth daily. Needs appt for  future refills   pantoprazole 20 MG tablet Commonly known as: PROTONIX Take 20 mg by mouth 2 (two) times daily.   potassium chloride SA 20 MEQ tablet Commonly known as: KLOR-CON Take 20 mEq by mouth in the morning and at bedtime.   predniSONE 50 MG tablet Commonly known as: DELTASONE Take 1 tablet (50 mg total) by mouth daily with breakfast for 3 days.   sildenafil 20 MG tablet Commonly known as: REVATIO TAKE TWO TABLETS BY MOUTH THREE TIMES A DAY What changed: See the new instructions.   simvastatin 10 MG tablet Commonly known as: ZOCOR Take 10 mg by mouth at bedtime.   torsemide 20 MG tablet Commonly known as: DEMADEX Take 2 tablets (40 mg total) by mouth daily.   Vitamin D3 25 MCG tablet Commonly known as: Vitamin D Take 1 tablet (1,000 Units total) by mouth daily. Start taking on: November 19, 2020   zinc sulfate 220 (50 Zn) MG capsule Take 1 capsule (220 mg total) by mouth daily. Start taking on: November 19, 2020       No Known Allergies  Consultations:  none   Procedures/Studies: CT ANGIO CHEST PE W OR WO CONTRAST  Result Date: 11/14/2020  CLINICAL DATA:  Shortness of breath.  COPD.  Sepsis. EXAM: CT ANGIOGRAPHY CHEST CT ABDOMEN AND PELVIS WITH CONTRAST TECHNIQUE: Multidetector CT imaging of the chest was performed using the standard protocol during bolus administration of intravenous contrast. Multiplanar CT image reconstructions and MIPs were obtained to evaluate the vascular anatomy. Multidetector CT imaging of the abdomen and pelvis was performed using the standard protocol during bolus administration of intravenous contrast. CONTRAST:  1107mL OMNIPAQUE IOHEXOL 350 MG/ML SOLN COMPARISON:  CT dated September 08, 2020. FINDINGS: CTA CHEST FINDINGS Cardiovascular: Evaluation is limited by respiratory motion artifact. Within that limitation, there is no central pulmonary embolus. The size of the main pulmonary artery is normal. Cardiomegaly with coronary artery  calcification. Atherosclerotic changes of the thoracic aorta. There is an ascending thoracic aortic aneurysm measuring 4.5 cm. Atherosclerotic changes are noted. Mediastinum/Nodes: -- No mediastinal lymphadenopathy. -- No hilar lymphadenopathy. -- No axillary lymphadenopathy. -- No supraclavicular lymphadenopathy. -- Normal thyroid gland where visualized. -  Unremarkable esophagus. Lungs/Pleura: There is airspace consolidation at the lung bases. There is bronchial wall thickening and areas of mucous plugging primarily in the lower lobes. Emphysematous changes are noted. There is no pneumothorax. Musculoskeletal: No chest wall abnormality. No bony spinal canal stenosis. Review of the MIP images confirms the above findings. CT ABDOMEN and PELVIS FINDINGS Hepatobiliary: The liver is cirrhotic in appearance. There is a cholecystostomy tube in place. The gallbladder is decompressed. The tube appears grossly well position.There is no biliary ductal dilation. Pancreas: Normal contours without ductal dilatation. No peripancreatic fluid collection. Spleen: Unremarkable. Adrenals/Urinary Tract: --Adrenal glands: Unremarkable. --Right kidney/ureter: No hydronephrosis or radiopaque kidney stones. --Left kidney/ureter: No hydronephrosis or radiopaque kidney stones. --Urinary bladder: Again noted is an enhancing mass along the right lateral wall of the urinary bladder. This mass appears grossly similar in size to prior study. Stomach/Bowel: --Stomach/Duodenum: No hiatal hernia or other gastric abnormality. Normal duodenal course and caliber. --Small bowel: Unremarkable. --Colon: There is diffuse sigmoid diverticulosis with questionable early uncomplicated diverticulitis involving the sigmoid colon (axial series 5, image 51). --Appendix: Not visualized. No right lower quadrant inflammation or free fluid. Vascular/Lymphatic: The patient is status post prior EVAR. The excluded aneurysm sac is essentially stable from prior study.  --No retroperitoneal lymphadenopathy. --No mesenteric lymphadenopathy. --No pelvic or inguinal lymphadenopathy. Reproductive: Unremarkable Other: No ascites or free air. The abdominal wall is normal. Musculoskeletal. No acute displaced fractures. Review of the MIP images confirms the above findings. IMPRESSION: 1. Evaluation is limited by respiratory motion artifact. Within that limitation, there is no central pulmonary embolus. 2. Bilateral lower lobe airspace consolidation, concerning for pneumonia or aspiration. 3. Diffuse sigmoid diverticulosis with questionable early uncomplicated diverticulitis involving the sigmoid colon. 4. Persistent enhancing mass along the right lateral wall of the urinary bladder, concerning for bladder carcinoma. 5. Cirrhosis. 6. Stable abdominal and thoracic aortic aneurysms as detailed above. Please see prior reports for follow-up recommendations. 7. Stable cholecystostomy tube with decompression of the gallbladder. Aortic Atherosclerosis (ICD10-I70.0) and Emphysema (ICD10-J43.9). Electronically Signed   By: Constance Holster M.D.   On: 11/14/2020 01:04   CT ABDOMEN PELVIS W CONTRAST  Result Date: 11/14/2020 CLINICAL DATA:  Shortness of breath.  COPD.  Sepsis. EXAM: CT ANGIOGRAPHY CHEST CT ABDOMEN AND PELVIS WITH CONTRAST TECHNIQUE: Multidetector CT imaging of the chest was performed using the standard protocol during bolus administration of intravenous contrast. Multiplanar CT image reconstructions and MIPs were obtained to evaluate the vascular anatomy. Multidetector CT imaging of the abdomen and pelvis was performed  using the standard protocol during bolus administration of intravenous contrast. CONTRAST:  116mL OMNIPAQUE IOHEXOL 350 MG/ML SOLN COMPARISON:  CT dated September 08, 2020. FINDINGS: CTA CHEST FINDINGS Cardiovascular: Evaluation is limited by respiratory motion artifact. Within that limitation, there is no central pulmonary embolus. The size of the main pulmonary  artery is normal. Cardiomegaly with coronary artery calcification. Atherosclerotic changes of the thoracic aorta. There is an ascending thoracic aortic aneurysm measuring 4.5 cm. Atherosclerotic changes are noted. Mediastinum/Nodes: -- No mediastinal lymphadenopathy. -- No hilar lymphadenopathy. -- No axillary lymphadenopathy. -- No supraclavicular lymphadenopathy. -- Normal thyroid gland where visualized. -  Unremarkable esophagus. Lungs/Pleura: There is airspace consolidation at the lung bases. There is bronchial wall thickening and areas of mucous plugging primarily in the lower lobes. Emphysematous changes are noted. There is no pneumothorax. Musculoskeletal: No chest wall abnormality. No bony spinal canal stenosis. Review of the MIP images confirms the above findings. CT ABDOMEN and PELVIS FINDINGS Hepatobiliary: The liver is cirrhotic in appearance. There is a cholecystostomy tube in place. The gallbladder is decompressed. The tube appears grossly well position.There is no biliary ductal dilation. Pancreas: Normal contours without ductal dilatation. No peripancreatic fluid collection. Spleen: Unremarkable. Adrenals/Urinary Tract: --Adrenal glands: Unremarkable. --Right kidney/ureter: No hydronephrosis or radiopaque kidney stones. --Left kidney/ureter: No hydronephrosis or radiopaque kidney stones. --Urinary bladder: Again noted is an enhancing mass along the right lateral wall of the urinary bladder. This mass appears grossly similar in size to prior study. Stomach/Bowel: --Stomach/Duodenum: No hiatal hernia or other gastric abnormality. Normal duodenal course and caliber. --Small bowel: Unremarkable. --Colon: There is diffuse sigmoid diverticulosis with questionable early uncomplicated diverticulitis involving the sigmoid colon (axial series 5, image 51). --Appendix: Not visualized. No right lower quadrant inflammation or free fluid. Vascular/Lymphatic: The patient is status post prior EVAR. The excluded  aneurysm sac is essentially stable from prior study. --No retroperitoneal lymphadenopathy. --No mesenteric lymphadenopathy. --No pelvic or inguinal lymphadenopathy. Reproductive: Unremarkable Other: No ascites or free air. The abdominal wall is normal. Musculoskeletal. No acute displaced fractures. Review of the MIP images confirms the above findings. IMPRESSION: 1. Evaluation is limited by respiratory motion artifact. Within that limitation, there is no central pulmonary embolus. 2. Bilateral lower lobe airspace consolidation, concerning for pneumonia or aspiration. 3. Diffuse sigmoid diverticulosis with questionable early uncomplicated diverticulitis involving the sigmoid colon. 4. Persistent enhancing mass along the right lateral wall of the urinary bladder, concerning for bladder carcinoma. 5. Cirrhosis. 6. Stable abdominal and thoracic aortic aneurysms as detailed above. Please see prior reports for follow-up recommendations. 7. Stable cholecystostomy tube with decompression of the gallbladder. Aortic Atherosclerosis (ICD10-I70.0) and Emphysema (ICD10-J43.9). Electronically Signed   By: Constance Holster M.D.   On: 11/14/2020 01:04   DG Sinus/Fist Tube Chk-Non GI  Result Date: 10/26/2020 INDICATION: 87 year old with history of acute cholecystitis and a percutaneous cholecystostomy tube was placed on 09/08/2020. Patient has been undergoing a capping trial. According to the family, re-attached drain to a bag a couple days after the capping trial started on 10/06/2020 because the patient was symptomatic. However, the patient has had the tube capped for approximately 6 days without symptoms. EXAM: CHOLECYSTOSTOMY TUBE INJECTION MEDICATIONS: None ANESTHESIA/SEDATION: None FLUOROSCOPY TIME:  Fluoroscopy Time: 48 seconds, 62 mGy CONTRAST:  10 mL Omnipaque [id number] COMPLICATIONS: None immediate. PROCEDURE: Patient was placed supine on the fluoroscopic table. Drain was injected with contrast. Drain was flushed with  saline at the end of the procedure and attached to a gravity bag. FINDINGS: Niel Hummer is in a  stable position within the gallbladder. Pigtail portion of the drain is near the base of the gallbladder. Gallbladder fills with contrast and there is no evidence for leakage outside of the gallbladder. However, there is no filling of the cystic duct or common bile duct. IMPRESSION: 1. Drain remains well positioned in the gallbladder. However, the cystic duct no longer fills with contrast. 2. Drain was attached to a gravity bag. Electronically Signed   By: Markus Daft M.D.   On: 10/26/2020 14:53   DG Chest Port 1 View  Result Date: 11/13/2020 CLINICAL DATA:  Shortness of breath and decreased oxygen saturation. COPD with chronic oxygen use. EXAM: PORTABLE CHEST 1 VIEW COMPARISON:  09/18/2020 FINDINGS: Postoperative changes in the mediastinum. Cardiac enlargement. Pulmonary vascular congestion with diffuse interstitial pattern likely edema but pneumonia could also have this appearance. Changes are more prominent than on the previous study. No definite pleural effusion. No pneumothorax. Mediastinal contours appear intact. Calcification of the aorta. IMPRESSION: Cardiac enlargement with pulmonary vascular congestion and interstitial edema. Electronically Signed   By: Lucienne Capers M.D.   On: 11/13/2020 22:26   IR Radiologist Eval & Mgmt  Result Date: 10/26/2020 Please refer to notes tab for details about interventional procedure. (Op Note)     Subjective: He is breathing well, no new complaints.   Discharge Exam: Vitals:   11/18/20 0400 11/18/20 0600  BP: (!) 77/51 (!) 148/116  Pulse: (!) 58 (!) 50  Resp: 18   Temp: 97.9 F (36.6 C) 97.6 F (36.4 C)  SpO2:       General: Pt is alert, awake, not in acute distress Cardiovascular: RRR, S1/S2 +, no rubs, no gallops Respiratory: CTA bilaterally, no wheezing, no rhonchi Abdominal: Soft, NT, ND, bowel sounds + Extremities: no edema, no cyanosis    The  results of significant diagnostics from this hospitalization (including imaging, microbiology, ancillary and laboratory) are listed below for reference.     Microbiology: Recent Results (from the past 240 hour(s))  Culture, blood (Routine x 2)     Status: None (Preliminary result)   Collection Time: 11/13/20 10:34 PM   Specimen: BLOOD  Result Value Ref Range Status   Specimen Description BLOOD SITE NOT SPECIFIED  Final   Special Requests   Final    BOTTLES DRAWN AEROBIC AND ANAEROBIC Blood Culture adequate volume   Culture   Final    NO GROWTH 4 DAYS Performed at [facility], [address] [place], [place] [address]    Report Status PENDING  Incomplete  Culture, blood (Routine x 2)     Status: None   Collection Time: 11/13/20 10:34 PM   Specimen: BLOOD  Result Value Ref Range Status   Specimen Description BLOOD SITE NOT SPECIFIED  Final   Special Requests   Final    BOTTLES DRAWN AEROBIC AND ANAEROBIC Blood Culture adequate volume   Culture   Final    NO GROWTH 5 DAYS Performed at [facility], [address], [place], [place] [address]    Report Status 11/18/2020 FINAL  Final  SARS Coronavirus 2 by RT PCR (hospital order, performed in [facility] hospital lab) Nasopharyngeal Nasopharyngeal Swab     Status: Abnormal   Collection Time: 11/13/20 11:27 PM   Specimen: Nasopharyngeal Swab  Result Value Ref Range Status   SARS Coronavirus 2 POSITIVE (A) NEGATIVE Final    Comment: RESULT CALLED TO, READ BACK BY AND VERIFIED WITH: RN DAVIDSON ALEXIS BY MESSA H. AT [id number]  ON 11/14/2020 (NOTE) SARS-CoV-2 target nucleic acids are DETECTED  SARS-CoV-2 RNA is generally detectable in upper respiratory specimens  during the acute phase of infection.  Positive results are indicative  of the presence of the identified virus, but do not rule out bacterial infection or co-infection with other pathogens not detected by the test.  Clinical correlation with patient history and   other diagnostic information is necessary to determine patient infection status.  The expected result is negative.  Fact Sheet for Patients:   StrictlyIdeas.no   Fact Sheet for Healthcare Providers:   BankingDealers.co.za    This test is not yet approved or cleared by the [place] FDA and  has been authorized for detection and/or diagnosis of SARS-CoV-2 by FDA under an Emergency Use Authorization (EUA).  This EUA will remain in effect  (meaning this test can be used) for the duration of  the COVID-19 declaration under Section 564(b)(1) of the Act, 21 U.S.C. section 360-bbb-3(b)(1), unless the authorization is terminated or revoked sooner.  Performed at [facility], [place] [address], [place], [place] [address]      Labs: BNP (last 3 results) Recent Labs    09/15/20 0527 09/16/20 1337 11/13/20 2234  BNP 1,215.7* 370.2* 979.8*   Basic Metabolic Panel: Recent Labs  Lab 11/14/20 0409 11/14/20 0848 11/15/20 0621 11/16/20 0314 11/17/20 0110 11/18/20 0448  NA  --  132* 131* 131* 131* 134*  K  --  3.2* 3.2* 4.4 5.0 4.8  CL  --  93* 98 99 100 101  CO2  --  24 21* 22 23 24   GLUCOSE  --  87 148* 140* 132* 109*  BUN  --  25* 28* 34* 34* 35*  CREATININE  --  1.23 1.12 1.11 1.20 1.16  CALCIUM  --  7.8* 8.0* 8.3* 8.2* 8.1*  MG 1.6*  --   --  3.1*  --   --    Liver Function Tests: Recent Labs  Lab 11/14/20 0848 11/15/20 0621 11/16/20 0314 11/17/20 0110 11/18/20 0448  AST 28 20 22  32 30  ALT 16 14 16 28  33  ALKPHOS 78 59 59 84 49  BILITOT 1.5* 1.0 0.9 1.2 0.8  PROT 7.0 6.0* 6.3* 6.2* 5.8*  ALBUMIN 2.9* 2.4* 2.5* 2.4* 2.4*   No results for input(s): LIPASE, AMYLASE in the last 168 hours. No results for input(s): AMMONIA in the last 168 hours. CBC: Recent Labs  Lab 11/13/20 2234 11/15/20 0621 11/16/20 0314 11/17/20 0110 11/18/20 0448  WBC 14.6* 10.4 13.3* 11.4* 9.2  NEUTROABS 13.1* 9.3* 11.8* 10.2* 7.3   HGB 14.3 10.2* 10.5* 10.9* 10.8*  HCT 43.3 30.9* 32.4* 31.7* 31.5*  MCV 94.3 96.3 97.0 95.8 95.7  PLT 322 239 273 290 258   Cardiac Enzymes: No results for input(s): CKTOTAL, CKMB, CKMBINDEX, TROPONINI in the last 168 hours. BNP: Invalid input(s): POCBNP CBG: No results for input(s): GLUCAP in the last 168 hours. D-Dimer Recent Labs    11/17/20 0110 11/18/20 0448  DDIMER 1.37* 3.28*   Hgb A1c No results for input(s): HGBA1C in the last 72 hours. Lipid Profile No results for input(s): CHOL, HDL, LDLCALC, TRIG, CHOLHDL, LDLDIRECT in the last 72 hours. Thyroid function studies No results for input(s): TSH, T4TOTAL, T3FREE, THYROIDAB in the last 72 hours.  Invalid input(s): FREET3 Anemia work up No results for input(s): VITAMINB12, FOLATE, FERRITIN, TIBC, IRON, RETICCTPCT in the last 72 hours. Urinalysis    Component Value Date/Time   COLORURINE YELLOW 11/13/2020  [place] 11/13/2020 2235   LABSPEC 1.009 11/13/2020 2235   PHURINE 5.0 11/13/2020 2235   GLUCOSEU NEGATIVE 11/13/2020 2235   HGBUR LARGE (A) 11/13/2020 2235   BILIRUBINUR NEGATIVE 11/13/2020 2235   KETONESUR NEGATIVE 11/13/2020 2235   PROTEINUR NEGATIVE 11/13/2020 2235   NITRITE NEGATIVE 11/13/2020 2235   LEUKOCYTESUR NEGATIVE 11/13/2020 2235   Sepsis Labs Invalid input(s): PROCALCITONIN,  WBC,  LACTICIDVEN Microbiology Recent Results (from the past 240 hour(s))  Culture, blood (Routine x 2)     Status: None (Preliminary result)   Collection Time: 11/13/20 10:34 PM   Specimen: BLOOD  Result Value Ref Range Status   Specimen Description BLOOD SITE NOT SPECIFIED  Final   Special Requests   Final    BOTTLES DRAWN AEROBIC AND ANAEROBIC Blood Culture adequate volume   Culture   Final    NO GROWTH 4 DAYS Performed at [facility], [place] [address], [place], [place] [address]    Report Status PENDING  Incomplete  Culture, blood (Routine x 2)     Status: None   Collection Time: 11/13/20  10:34 PM   Specimen: BLOOD  Result Value Ref Range Status   Specimen Description BLOOD SITE NOT SPECIFIED  Final   Special Requests   Final    BOTTLES DRAWN AEROBIC AND ANAEROBIC Blood Culture adequate volume   Culture   Final    NO GROWTH 5 DAYS Performed at [facility], [address] [place], [place] [address]    Report Status 11/18/2020 FINAL  Final  SARS Coronavirus 2 by RT PCR (hospital order, performed in [facility] hospital lab) Nasopharyngeal Nasopharyngeal Swab     Status: Abnormal   Collection Time: 11/13/20 11:27 PM   Specimen: Nasopharyngeal Swab  Result Value Ref Range Status   SARS Coronavirus 2 POSITIVE (A) NEGATIVE Final    Comment: RESULT CALLED TO, READ BACK BY AND VERIFIED WITH: RN DAVIDSON ALEXIS BY MESSA H. AT [id number] ON 11/14/2020 (NOTE) SARS-CoV-2 target nucleic acids are DETECTED  SARS-CoV-2 RNA is generally detectable in upper respiratory specimens  during the acute phase of infection.  Positive results are indicative  of the presence of the identified virus, but do not rule out bacterial infection or co-infection with other pathogens not detected by the test.  Clinical correlation with patient history and  other diagnostic information is necessary to determine patient infection status.  The expected result is negative.  Fact Sheet for Patients:   StrictlyIdeas.no   Fact Sheet for Healthcare Providers:   BankingDealers.co.za    This test is not yet approved or cleared by the [place] FDA and  has been authorized for detection and/or diagnosis of SARS-CoV-2 by FDA under an Emergency Use Authorization (EUA).  This EUA will remain in effect  (meaning this test can be used) for the duration of  the COVID-19 declaration under Section 564(b)(1) of the Act, 21 U.S.C. section 360-bbb-3(b)(1), unless the authorization is terminated or revoked sooner.  Performed at [facility], [place] [address] [place], [place] [address]      Time coordinating discharge: 40 minutes  SIGNED:   Elmarie Shiley, MD  [facility]

## 2020-11-18 NOTE — Care Management Important Message (Signed)
Important Message  Patient Details  Name: Vincent Robinson MRN: [id number] Date of Birth: 08-24-1923   Medicare Important Message Given:  Yes Brianna NT,gave pt. his IM letter. Pt. On Airborne and Contact  Precautions.     Holli Humbles Smith 11/18/2020, 12:47 PM

## 2020-11-19 LAB — CULTURE, BLOOD (ROUTINE X 2)
Culture: NO GROWTH
Special Requests: ADEQUATE

## 2020-11-24 ENCOUNTER — Ambulatory Visit (HOSPITAL_COMMUNITY): Payer: Medicare Other

## 2020-11-28 ENCOUNTER — Ambulatory Visit (HOSPITAL_COMMUNITY): Payer: Medicare Other

## 2020-12-02 ENCOUNTER — Ambulatory Visit (HOSPITAL_COMMUNITY)
Admission: RE | Admit: 2020-12-02 | Discharge: 2020-12-02 | Disposition: A | Discharge status: Home / Self Care | Payer: Medicare Other | Source: Ambulatory Visit | Attending: Surgery | Admitting: Surgery

## 2020-12-02 ENCOUNTER — Other Ambulatory Visit: Payer: Self-pay

## 2020-12-02 DIAGNOSIS — K810 Acute cholecystitis: Secondary | ICD-10-CM | POA: Diagnosis not present

## 2020-12-02 DIAGNOSIS — Z434 Encounter for attention to other artificial openings of digestive tract: Secondary | ICD-10-CM | POA: Diagnosis not present

## 2020-12-02 HISTORY — PX: IR EXCHANGE BILIARY DRAIN: IMG6046

## 2020-12-02 HISTORY — PX: IR CATHETER TUBE CHANGE: IMG717

## 2020-12-02 MED ORDER — LIDOCAINE HCL 1 % IJ SOLN
INTRAMUSCULAR | Status: AC
  Filled 2020-12-02: qty 20

## 2020-12-02 MED ORDER — IOHEXOL 300 MG/ML  SOLN
50.0000 mL | Freq: Once | INTRAMUSCULAR | Status: AC | PRN
  Administered 2020-12-02: 10 mL

## 2020-12-02 NOTE — Procedures (Signed)
Interventional Radiology Procedure Note  Procedure: Cholecystitis  Indication: Cholecystomstomy drain exachange  Findings: Please refer to procedural dictation for full description.  Complications: None  EBL: < 10 mL  Miachel Roux, MD [phone]

## 2020-12-05 ENCOUNTER — Ambulatory Visit (HOSPITAL_COMMUNITY): Payer: Medicare Other

## 2020-12-05 ENCOUNTER — Telehealth: Payer: Self-pay

## 2020-12-05 NOTE — Telephone Encounter (Signed)
(  1:25p) Palliative care SW returned call to patient's daughter/PCG-Sharon. SW provided education regarding palliative care services/support. SW also provided education dialogue regarding care options, returning home, senior independent living and provided resources for in-home care. Patient has been staying at his daughter's home and is eager to return to independence or a independent living at a facility. Patient is alert and oriented x 4, continues to drive, completes personal care tasks independently, by has several health issues that causes her concern of patient returning home. [name] will review information provided by SW and will discuss with her father to make a decision. SW encouraged that patient's safety should drive decisions. SW encouraged her to call with any additional questions or support.  * Web designer, Georgiann Hahn updated for follow-up.

## 2020-12-06 ENCOUNTER — Other Ambulatory Visit (HOSPITAL_COMMUNITY): Payer: Self-pay | Admitting: Surgery

## 2020-12-06 ENCOUNTER — Encounter (HOSPITAL_COMMUNITY): Payer: Self-pay

## 2020-12-06 DIAGNOSIS — K810 Acute cholecystitis: Secondary | ICD-10-CM

## 2020-12-16 ENCOUNTER — Other Ambulatory Visit: Payer: Self-pay | Admitting: Critical Care Medicine

## 2020-12-19 ENCOUNTER — Other Ambulatory Visit: Payer: Self-pay | Admitting: Critical Care Medicine

## 2020-12-19 ENCOUNTER — Other Ambulatory Visit (HOSPITAL_COMMUNITY): Payer: Self-pay | Admitting: *Deleted

## 2020-12-19 MED ORDER — MIDODRINE HCL 10 MG PO TABS: 15.0000 mg | ORAL_TABLET | Freq: Three times a day (TID) | ORAL | 1 refills | Status: DC

## 2020-12-23 ENCOUNTER — Other Ambulatory Visit (HOSPITAL_COMMUNITY): Payer: Self-pay | Admitting: *Deleted

## 2020-12-23 MED ORDER — MIDODRINE HCL 10 MG PO TABS: 15.0000 mg | ORAL_TABLET | Freq: Three times a day (TID) | ORAL | 1 refills | Status: DC

## 2020-12-28 ENCOUNTER — Ambulatory Visit (INDEPENDENT_AMBULATORY_CARE_PROVIDER_SITE_OTHER): Payer: Medicare Other | Admitting: Cardiovascular Disease

## 2020-12-28 ENCOUNTER — Encounter: Payer: Self-pay | Admitting: Cardiovascular Disease

## 2020-12-28 ENCOUNTER — Other Ambulatory Visit: Payer: Self-pay

## 2020-12-28 VITALS — BP 126/70 | HR 67 | Wt 151.0 lb

## 2020-12-28 DIAGNOSIS — I272 Pulmonary hypertension, unspecified: Secondary | ICD-10-CM

## 2020-12-28 DIAGNOSIS — I4821 Permanent atrial fibrillation: Secondary | ICD-10-CM | POA: Diagnosis not present

## 2020-12-28 DIAGNOSIS — I25119 Atherosclerotic heart disease of native coronary artery with unspecified angina pectoris: Secondary | ICD-10-CM

## 2020-12-28 DIAGNOSIS — I350 Nonrheumatic aortic (valve) stenosis: Secondary | ICD-10-CM

## 2020-12-28 DIAGNOSIS — I2510 Atherosclerotic heart disease of native coronary artery without angina pectoris: Secondary | ICD-10-CM | POA: Diagnosis not present

## 2020-12-28 DIAGNOSIS — I714 Abdominal aortic aneurysm, without rupture, unspecified: Secondary | ICD-10-CM

## 2020-12-28 DIAGNOSIS — E782 Mixed hyperlipidemia: Secondary | ICD-10-CM

## 2020-12-28 NOTE — Assessment & Plan Note (Signed)
History of abdominal aortic aneurysm status post endoluminal stent grafting 10/09.

## 2020-12-28 NOTE — Assessment & Plan Note (Signed)
History of pulmonary hypertension on Revatio  followed by Dr. Haroldine Laws

## 2020-12-28 NOTE — Patient Instructions (Signed)

## 2020-12-28 NOTE — Assessment & Plan Note (Addendum)
History of hyperlipidemia on statin therapy followed by his PCP 

## 2020-12-28 NOTE — Assessment & Plan Note (Signed)
History of CAD status post CABG x5 in 1998.  He had a negative Myoview stress test 10/23/2017.  He denies chest pain.

## 2020-12-28 NOTE — Progress Notes (Signed)
12/28/2020 Vincent Robinson   1923-07-20  [id number]  Primary Physician Vincent Nest Jannifer Rodney, MD Primary Cardiologist: Vincent Harp MD Vincent Robinson, [place]  HPI:  Vincent Robinson is a 87 y.o.  married Caucasian male, father of 2, grandfather of 2 grandchildren originally from [place] who lives in [place] now be closer to his family.He is a Theatre stage manager. He was referred by Dr. Doreene Nest for cardiovascular evaluation because of increasing dyspnea on exertion thought not to be a pulmonary etiology.I last spoke to him on the phone for a virtual telemedicine phone visit 12/10/2019.Marland Kitchen Hedoes have a history of hyperlipidemia. He's never had a heart attack or stroke. He had coronary artery bypass grafting 5-10 years ago with recath prior to his endoluminal stent graft 10/09 that showed patent grafts. He had an endoluminal stent graft for abdominal aortic aneurysm in [place] 10/09 with recent Doppler performed 2/18 revealed aortic dimensions of approximately 4.4 x 4.6 cm. Recently had a spontaneous pneumothorax that was treated conservatively as well as a diverticular bleed requiring significant blood transfusion. He is currently on continuous nasal oxygen over the last 2 months. He was begun on a diuretic which improved his breathing. Since I saw him about a month ago we obtained a 2-D echo that showed normal LV systolic function, mild to moderate aortic stenosis with severe pulmonary hypertension. Abdominal Dopplers revealed a abdominal aorta measuring 2.9 cm. He does relate increasing dyspnea on exertion despite supplemental oxygen. He does have 1-2+ pitting edema. There is also had a history of spontaneous pneumothorax on the right hear good breath sounds bilaterally. I'm going to obtain a PA and lateral chest x-ray, double diuretics and obtain a CT rule out chronic PE as well as apharmacologicMyoview stress test to rule out an ischemic etiology which was performed 10/23/17 and was  entirely normal.I'm referring him to the advanced heart failure clinic for further evaluation .  I referred him to Dr. Jeffie Robinson who performed a right heart cath confirming pulmonary hypertension and began him on Revatio which has resulted in improvement in his symptoms.  Since I saw him in the office 5 months ago he was admitted to [facility] 11/22/2019 and discharged home 3 days later for atypical chest pain.  His troponins were fairly low.  A 2D echocardiogram performed 11/23/2019 revealed normal LV systolic function with moderate aortic stenosis, valve area of 1.28 cm with a peak gradient of 36 mmHg and severe pulmonary hypertension.  He did not think his symptoms were similar to his prebypass symptoms.  Medical therapy was recommended.  He does have a history of atrial fibrillation but has not been on anticoagulation because of bleeding risk.  He is on chronic O2 and BiPAP at night. Heis onRevatiofor his pulmonary hypertension and sees Dr. Haroldine Robinson for this. He currently is in A. fib with controlled ventricular spots but is not a candidate for oral anticoagulation because of his age, comorbidities and recent rectal bleeding.  Since I spoke to him a year ago he has been admitted for septic shock and cholecystitis and currently has an indwelling Foley biliary drain.  He also was admitted for Covid pneumonia and spent 11 days in a rehab facility.  He has recovered from this.   Current Meds  Medication Sig  . albuterol (PROVENTIL HFA;VENTOLIN HFA) 108 (90 Base) MCG/ACT inhaler Inhale 1 puff into the lungs 2 (two) times daily. (Patient taking differently: Inhale 1 puff into the lungs every 4 (four)  hours as needed.)  . fluticasone (FLONASE) 50 MCG/ACT nasal spray Place 1 spray into both nostrils daily as needed for allergies or rhinitis.  . Fluticasone-Salmeterol,sensor, 113-14 MCG/ACT AEPB Inhale 1 puff into the lungs 2 (two) times daily.  Marland Kitchen levothyroxine (SYNTHROID) 100 MCG  tablet Take 1 tablet (100 mcg total) by mouth daily at 6 (six) AM.  . loratadine (CLARITIN) 10 MG tablet Take 10 mg by mouth daily as needed for allergies.   . macitentan (OPSUMIT) 10 MG tablet Take 1 tablet (10 mg total) by mouth daily. Needs appt for future refills  . midodrine (PROAMATINE) 10 MG tablet Take 1.5 tablets (15 mg total) by mouth 3 (three) times daily after meals.  . montelukast (SINGULAIR) 10 MG tablet Take 10 mg by mouth at bedtime.  . pantoprazole (PROTONIX) 20 MG tablet Take 20 mg by mouth 2 (two) times daily.   . potassium chloride SA (KLOR-CON) 20 MEQ tablet Take 20 mEq by mouth in the morning and at bedtime.  . sildenafil (REVATIO) 20 MG tablet TAKE TWO TABLETS BY MOUTH THREE TIMES A DAY (Patient taking differently: Take 40 mg by mouth 3 (three) times daily.)  . simvastatin (ZOCOR) 10 MG tablet Take 10 mg by mouth at bedtime.  . torsemide (DEMADEX) 20 MG tablet Take 2 tablets (40 mg total) by mouth daily.  . [DISCONTINUED] ascorbic acid (VITAMIN C) 500 MG tablet Take 1 tablet (500 mg total) by mouth daily.  . [DISCONTINUED] cholecalciferol (VITAMIN D) 25 MCG tablet Take 1 tablet (1,000 Units total) by mouth daily.  . [DISCONTINUED] zinc sulfate 220 (50 Zn) MG capsule Take 1 capsule (220 mg total) by mouth daily.     No Known Allergies  Social History   Socioeconomic History  . Marital status: Married    Spouse name: Not on file  . Number of children: Not on file  . Years of education: Not on file  . Highest education level: Not on file  Occupational History  . Not on file  Tobacco Use  . Smoking status: Former Smoker    Packs/day: 0.50    Quit date: 08/06/1961    Years since quitting: 59.4  . Smokeless tobacco: Never Used  Vaping Use  . Vaping Use: Never used  Substance and Sexual Activity  . Alcohol use: Not Currently  . Drug use: Never  . Sexual activity: Not on file  Other Topics Concern  . Not on file  Social History Narrative  . Not on file    Social Determinants of Health   Financial Resource Strain: Not on file  Food Insecurity: Not on file  Transportation Needs: Not on file  Physical Activity: Not on file  Stress: Not on file  Social Connections: Not on file  Intimate Partner Violence: Not on file     Review of Systems: General: negative for chills, fever, night sweats or weight changes.  Cardiovascular: negative for chest pain, dyspnea on exertion, edema, orthopnea, palpitations, paroxysmal nocturnal dyspnea or shortness of breath Dermatological: negative for rash Respiratory: negative for cough or wheezing Urologic: negative for hematuria Abdominal: negative for nausea, vomiting, diarrhea, bright red blood per rectum, melena, or hematemesis Neurologic: negative for visual changes, syncope, or dizziness All other systems reviewed and are otherwise negative except as noted above.    Blood pressure 126/70, pulse 67, weight 151 lb (68.5 kg), SpO2 91 %.  General appearance: alert and no distress Neck: no adenopathy, no JVD, supple, symmetrical, trachea midline, thyroid not enlarged, symmetric, no  tenderness/mass/nodules and Bilateral carotid bruits versus transmitted murmur Lungs: clear to auscultation bilaterally Heart: Irregularly irregular with a 2/6 outflow tract murmur consistent with aortic stenosis. Extremities: Trace to 1+ lower extremity edema.  The patient is wear compression stockings. Pulses: 2+ and symmetric Skin: Skin color, texture, turgor normal. No rashes or lesions Neurologic: Alert and oriented X 3, normal strength and tone. Normal symmetric reflexes. Normal coordination and gait  EKG atrial fibrillation with ventricular sponsor 67 right bundle branch block.  I personally reviewed this EKG.  ASSESSMENT AND PLAN:   Hyperlipidemia History of hyperlipidemia on statin therapy followed by his PCP  Coronary artery disease History of CAD status post CABG x5 in 1998.  He had a negative Myoview stress  test 10/23/2017.  He denies chest pain.  AAA (abdominal aortic aneurysm) without rupture (HCC) History of abdominal aortic aneurysm status post endoluminal stent grafting 10/09.  Pulmonary hypertension, unspecified ([place]) History of pulmonary hypertension on Revatio  followed by Dr. Haroldine Robinson  Aortic stenosis, moderate Moderate aortic valve stenosis by 2D echo 09/08/2020.  Atrial fibrillation (HCC) Chronic atrial fibrillation not on oral anticoagulation because of bleeding risk.      Vincent Harp MD [place], [facility] 12/28/2020 12:55 PM

## 2020-12-28 NOTE — Assessment & Plan Note (Signed)
Moderate aortic valve stenosis by 2D echo 09/08/2020.

## 2020-12-28 NOTE — Assessment & Plan Note (Signed)
Chronic atrial fibrillation not on oral anticoagulation because of bleeding risk.

## 2020-12-29 ENCOUNTER — Ambulatory Visit (HOSPITAL_COMMUNITY)
Admission: RE | Admit: 2020-12-29 | Discharge: 2020-12-29 | Disposition: A | Discharge status: Home / Self Care | Payer: Medicare Other | Source: Ambulatory Visit | Attending: Internal Medicine | Admitting: Internal Medicine

## 2020-12-29 ENCOUNTER — Encounter (HOSPITAL_COMMUNITY): Payer: Self-pay | Admitting: Internal Medicine

## 2020-12-29 VITALS — BP 106/50 | HR 80 | Wt 153.6 lb

## 2020-12-29 DIAGNOSIS — G4733 Obstructive sleep apnea (adult) (pediatric): Secondary | ICD-10-CM | POA: Insufficient documentation

## 2020-12-29 DIAGNOSIS — J449 Chronic obstructive pulmonary disease, unspecified: Secondary | ICD-10-CM | POA: Insufficient documentation

## 2020-12-29 DIAGNOSIS — K819 Cholecystitis, unspecified: Secondary | ICD-10-CM | POA: Diagnosis not present

## 2020-12-29 DIAGNOSIS — I4819 Other persistent atrial fibrillation: Secondary | ICD-10-CM | POA: Insufficient documentation

## 2020-12-29 DIAGNOSIS — I2729 Other secondary pulmonary hypertension: Secondary | ICD-10-CM | POA: Insufficient documentation

## 2020-12-29 DIAGNOSIS — Z87891 Personal history of nicotine dependence: Secondary | ICD-10-CM | POA: Diagnosis not present

## 2020-12-29 DIAGNOSIS — I50812 Chronic right heart failure: Secondary | ICD-10-CM | POA: Insufficient documentation

## 2020-12-29 DIAGNOSIS — I350 Nonrheumatic aortic (valve) stenosis: Secondary | ICD-10-CM | POA: Insufficient documentation

## 2020-12-29 DIAGNOSIS — I272 Pulmonary hypertension, unspecified: Secondary | ICD-10-CM

## 2020-12-29 DIAGNOSIS — Z7951 Long term (current) use of inhaled steroids: Secondary | ICD-10-CM | POA: Diagnosis not present

## 2020-12-29 DIAGNOSIS — E785 Hyperlipidemia, unspecified: Secondary | ICD-10-CM | POA: Insufficient documentation

## 2020-12-29 DIAGNOSIS — J849 Interstitial pulmonary disease, unspecified: Secondary | ICD-10-CM | POA: Insufficient documentation

## 2020-12-29 DIAGNOSIS — Z951 Presence of aortocoronary bypass graft: Secondary | ICD-10-CM | POA: Insufficient documentation

## 2020-12-29 DIAGNOSIS — I2510 Atherosclerotic heart disease of native coronary artery without angina pectoris: Secondary | ICD-10-CM | POA: Diagnosis not present

## 2020-12-29 DIAGNOSIS — J9610 Chronic respiratory failure, unspecified whether with hypoxia or hypercapnia: Secondary | ICD-10-CM | POA: Insufficient documentation

## 2020-12-29 DIAGNOSIS — Z79899 Other long term (current) drug therapy: Secondary | ICD-10-CM | POA: Diagnosis not present

## 2020-12-29 HISTORY — DX: Heart failure, unspecified: I50.9

## 2020-12-29 LAB — BASIC METABOLIC PANEL
Anion gap: 9 (ref 5–15)
BUN: 24 mg/dL — ABNORMAL HIGH (ref 8–23)
CO2: 27 mmol/L (ref 22–32)
Calcium: 8.6 mg/dL — ABNORMAL LOW (ref 8.9–10.3)
Chloride: 99 mmol/L (ref 98–111)
Creatinine, Ser: 1.01 mg/dL (ref 0.61–1.24)
GFR, Estimated: >60 mL/min (ref >60)
Glucose, Bld: 91 mg/dL (ref 70–99)
Potassium: 3.6 mmol/L (ref 3.5–5.1)
Sodium: 135 mmol/L (ref 135–145)

## 2020-12-29 LAB — BRAIN NATRIURETIC PEPTIDE: B Natriuretic Peptide: 241.7 pg/mL — ABNORMAL HIGH (ref 0.0–100.0)

## 2020-12-29 MED ORDER — MIDODRINE HCL 5 MG PO TABS: 5.0000 mg | ORAL_TABLET | Freq: Three times a day (TID) | ORAL | 3 refills | Status: DC

## 2020-12-29 MED ORDER — MIDODRINE HCL 10 MG PO TABS: 10.0000 mg | ORAL_TABLET | Freq: Three times a day (TID) | ORAL | 3 refills | Status: DC

## 2020-12-29 NOTE — Progress Notes (Signed)
ADVANCED HF CLINIC NOTE  Referring Physician: Dr. Gwenlyn Found  Primary Cardiologist: Dr. Gwenlyn Found   HPI:  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   He had coronary artery bypass grafting in 1998. He had an endoluminal stent graft for abdominal aortic aneurysm in [place] 10/09 . Pre-op cath showed patent grafts.  Doppler performed 2/18 revealed aortic dimensions of approximately 4.4 x 4.6 cm. Has been followed by Dr. Vaughan Browner.  Wears BIPAP for severe OSA. Has been on home O2 since 2017.   He was referred to me in 2/19 by Dr. Gwenlyn Found for further evaluation of moderate to severe PAH, He has toreated combination therapy well with improvement of his symptoms   Echo 11/19: EF 60-65% RV mild to moderately reduced. RVSP 25mmHG Mild AS  Hospitalized in 2/21 for volume overload and hypoxia. Diuresed well. Switched to torsemide. Started on midodrine for low BP.,   Admitted in 11/21 Sepsis due to acute cholecystitis with enterococcus and enterobacter. C- tube placed.Marland Kitchen  Hospitalized in 1/22 due COVID and aspiration PNA   He returns today for f/u with his daughter. Overall doing ok. Gets SOB with walking but able to do his ADLs. Ankle swelling a bit but getting better. No CP. No orthopnea or PND. No syncope or presyncope, Ongoing mild drainage in C-tube, No fevers or chills.   Echo 11/21 EF 60-65% RV moderately HK RVSP 85 AoV severely calcified. Moderate AS Personally reviewed   Studies:  RHC 12/11/17 RA = 6 RV = 72/7 PA = 73/27 (44) PCW = 10 Fick cardiac output/index = 4.1/2.1 PVR = 8.4 WU Ao sat = 91% PA sat = 58%, 58%  Echo 11/18 EF 60-65% grade I DD  RV severely dilated moderate TR RVSP 65mmHG   VQ 12/18 Normal perfusion lung scan.  Irregular peripheral ventilation in both lungs consistent with chronic parenchymal lung disease changes.  PFTs 02/28/16 FVC 3.37 (116%), FEV1 2.08  (108%), F/F 62 Mild obstructive airway disease, no broncho-dilator response  CT scan 07/15/15- 2 .3 x 1.7 cm right upper lobe calcified opacity likely to represent pleural , parenchymal scarring. Mild centrilobular, paraseptal emphysema. Subpleural reticulation, fibrosis representing mild chronic ILD High-resolution CT 09/04/16 - stable right upper lobe scarring, hyper aerated lungs. Minimal interstitial lung disease and peripheral unchanged compared to 2016.  Hi-res CT: 01/06/18: Stable ILD   Review of systems complete and found to be negative unless listed in HPI.   Past Medical History:  Diagnosis Date  . AAA (abdominal aortic aneurysm) ([place])   . Acute respiratory failure ([place]) 11/23/2019  . Asthma   . Basal cell carcinoma   . CHF (congestive heart failure) ([place])   . Chronic respiratory failure ([place])   . COPD (chronic obstructive pulmonary disease) ([place])   . Coronary artery disease   . History of chronic respiratory failure   . Hyperlipidemia   . Hypothyroidism   . Right heart failure ([place])   . Skin cancer 2017  . Sleep apnea     Current Outpatient Medications  Medication Sig Dispense Refill  . fluticasone (FLONASE) 50 MCG/ACT nasal spray Place 1 spray into both nostrils daily as needed for allergies or rhinitis.    . Fluticasone-Salmeterol,sensor, 113-14 MCG/ACT AEPB Inhale 1 puff into the lungs 2 (two) times daily. 1 each 1  . Ipratropium-Albuterol (ALBUTEROL-IPRATROPIUM IN) Inhale 1 Dose into the  lungs daily as needed.    Marland Kitchen levothyroxine (SYNTHROID) 100 MCG tablet Take 1 tablet (100 mcg total) by mouth daily at 6 (six) AM. 30 tablet 1  . loratadine (CLARITIN) 10 MG tablet Take 10 mg by mouth daily as needed for allergies.     . macitentan (OPSUMIT) 10 MG tablet Take 1 tablet (10 mg total) by mouth daily. Needs appt for future refills 30 tablet 3  . midodrine (PROAMATINE) 10 MG tablet Take 1.5 tablets (15 mg total) by mouth 3 (three) times daily after meals. 135 tablet 1  .  montelukast (SINGULAIR) 10 MG tablet Take 10 mg by mouth at bedtime.    . pantoprazole (PROTONIX) 20 MG tablet Take 20 mg by mouth daily.    . potassium chloride SA (KLOR-CON) 20 MEQ tablet Take 20 mEq by mouth in the morning and at bedtime.    . sildenafil (REVATIO) 20 MG tablet TAKE TWO TABLETS BY MOUTH THREE TIMES A DAY 540 tablet 3  . simvastatin (ZOCOR) 10 MG tablet Take 10 mg by mouth at bedtime.    . torsemide (DEMADEX) 20 MG tablet Take 2 tablets (40 mg total) by mouth daily. 60 tablet 6   No current facility-administered medications for this encounter.    No Known Allergies    Social History   Socioeconomic History  . Marital status: Married    Spouse name: Not on file  . Number of children: Not on file  . Years of education: Not on file  . Highest education level: Not on file  Occupational History  . Not on file  Tobacco Use  . Smoking status: Former Smoker    Packs/day: 0.50    Quit date: 08/06/1961    Years since quitting: 59.4  . Smokeless tobacco: Never Used  Vaping Use  . Vaping Use: Never used  Substance and Sexual Activity  . Alcohol use: Not Currently  . Drug use: Never  . Sexual activity: Not on file  Other Topics Concern  . Not on file  Social History Narrative  . Not on file   Social Determinants of Health   Financial Resource Strain: Not on file  Food Insecurity: Not on file  Transportation Needs: Not on file  Physical Activity: Not on file  Stress: Not on file  Social Connections: Not on file  Intimate Partner Violence: Not on file      Family History  Problem Relation Age of Onset  . Emphysema Mother   . Stroke Father   . Movement disorder Sister   . Aneurysm Brother        Thoracic aorta  . Macular degeneration Brother     Vitals:   12/29/20 1113  BP: (!) 106/50  Pulse: 80  SpO2: 98%  Weight: 69.7 kg   Wt Readings from Last 3 Encounters:  12/29/20 69.7 kg (153 lb 9.6 oz)  12/28/20 68.5 kg (151 lb)  11/17/20 72.3 kg (159  lb 6.3 oz)    PHYSICAL EXAM: General:  Elderly No resp difficulty on O2 HEENT: normal Neck: supple. no Carotids 2+ bilat;+ bruits. No lymphadenopathy or thryomegaly appreciated. Cor: PMI nondisplaced. Regular rate & rhythm. 2/6 AS Lungs: clear with decreased BS throughout Abdomen: soft, nontender, nondistended. No hepatosplenomegaly. No bruits or masses. Good bowel sounds. Chole bag ok  Extremities: no cyanosis, clubbing, rash, 2+ankle edema (no pretibial edema) Neuro: alert & orientedx3, cranial nerves grossly intact. moves all 4 extremities w/o difficulty. Affect pleasant  ASSESSMENT & PLAN:  1. Pulmonary  hypertension with cor pulmonale - Echo, CT, PFTs and VQ studies reviewed personally - He has evidence of significant pulmonary HTN with severe RV failure. Etiology remains unclear.  - CT with evidence of ILD but PFTs not that bad. VQ negative.  - PH multifactorial but out of proportion to HF & ILD so felt to have component of Group I disease - RHC 2/19 c/w moderate PAH - NYHA III Continue sildenafil 40 mg tid  And macitentan 10 daily. No plans to add selexipag at this point - Volume status stable on torsemide 40 daily - Check labs today - Continue compression stockings  - Labs today  2. Chronic respiratory failure - evidence of mild ILD on CT. PFTs not too bad - has severe OSA on Bipap. Continue BiPAP qHS - he uses it religiously - continue supplemental O2. sats look good today - Follows with Pulmonary   3. CAD s/p CABG - No s/s angian Myoview 1/19 normal.  - Managed by Dr. Gwenlyn Found. On ASA and statin  4. Aortic stenosis - Moderate by echo 2/21 mean gradient 22. Will follow   5. Atrial fibrillation, persistent - Rate controlled. Not candidate for [facility] due to recurrent LGIB  7. Cholecystitis - continue C-tube with regular tube changes - he is not candidate for cholecystectomy due to very high surgical risk   Glori Bickers, MD  11:27 AM

## 2020-12-29 NOTE — Patient Instructions (Addendum)
Labs done today. We will contact you only if your labs are abnormal.  TAKE both Midodrine 5mg  (1 tablet) AND Midodrine 10mg  (1 tablet) by mouth 3 times daily.  No other medication changes were made. Please continue all current medications as prescribed.  Your physician recommends that you schedule a follow-up appointment in: 6 months. Please contact our office in August to schedule a September appointment.  If you have any questions or concerns before your next appointment please send [place] a message through [place] or call our office at [phone].    TO LEAVE A MESSAGE FOR THE NURSE SELECT OPTION 2, PLEASE LEAVE A MESSAGE INCLUDING: . YOUR NAME . DATE OF BIRTH . CALL BACK NUMBER . REASON FOR CALL**this is important as we prioritize the call backs  YOU WILL RECEIVE A CALL BACK THE SAME DAY AS LONG AS YOU CALL BEFORE 4:00 PM   Do the following things EVERYDAY: 1) Weigh yourself in the morning before breakfast. Write it down and keep it in a log. 2) Take your medicines as prescribed 3) Eat low salt foods--Limit salt (sodium) to 2000 mg per day.  4) Stay as active as you can everyday 5) Limit all fluids for the day to less than 2 liters   At the [facility], you and your health needs are our priority. As part of our continuing mission to provide you with exceptional heart care, we have created designated Provider Care Teams. These Care Teams include your primary Cardiologist (physician) and Advanced Practice Providers (APPs- Physician Assistants and Nurse Practitioners) who all work together to provide you with the care you need, when you need it.   You may see any of the following providers on your designated Care Team at your next follow up: Marland Kitchen Dr Glori Bickers . Dr Loralie Champagne . Darrick Grinder, NP . Lyda Jester, PA . Audry Riles, PharmD   Please be sure to bring in all your medications bottles to every appointment.

## 2021-01-09 ENCOUNTER — Other Ambulatory Visit (HOSPITAL_COMMUNITY): Payer: Self-pay | Admitting: Internal Medicine

## 2021-02-02 ENCOUNTER — Other Ambulatory Visit (HOSPITAL_COMMUNITY): Payer: Self-pay | Admitting: Interventional Radiology

## 2021-02-02 DIAGNOSIS — K810 Acute cholecystitis: Secondary | ICD-10-CM

## 2021-02-07 ENCOUNTER — Telehealth: Payer: Self-pay

## 2021-02-07 NOTE — Telephone Encounter (Signed)
Attempted to contact patient to schedule a follow up appointment. No answer left a message to return call to advise us if they would like to reschedule or cancel services.  

## 2021-02-08 ENCOUNTER — Telehealth: Payer: Self-pay | Admitting: Student

## 2021-02-08 ENCOUNTER — Other Ambulatory Visit (HOSPITAL_COMMUNITY): Payer: Self-pay | Admitting: Interventional Radiology

## 2021-02-08 ENCOUNTER — Other Ambulatory Visit: Payer: Self-pay

## 2021-02-08 ENCOUNTER — Ambulatory Visit (HOSPITAL_COMMUNITY)
Admission: RE | Admit: 2021-02-08 | Discharge: 2021-02-08 | Disposition: A | Discharge status: Home / Self Care | Payer: Medicare Other | Source: Ambulatory Visit | Attending: Interventional Radiology | Admitting: Interventional Radiology

## 2021-02-08 DIAGNOSIS — K810 Acute cholecystitis: Secondary | ICD-10-CM

## 2021-02-08 DIAGNOSIS — Z4803 Encounter for change or removal of drains: Secondary | ICD-10-CM | POA: Diagnosis not present

## 2021-02-08 HISTORY — PX: IR EXCHANGE BILIARY DRAIN: IMG6046

## 2021-02-08 MED ORDER — LIDOCAINE HCL (PF) 1 % IJ SOLN
INTRAMUSCULAR | Status: AC | PRN
Start: 2021-02-08 — End: 2021-02-08
  Administered 2021-02-08: 10 mL

## 2021-02-08 MED ORDER — LIDOCAINE HCL 1 % IJ SOLN
INTRAMUSCULAR | Status: AC
  Filled 2021-02-08: qty 20

## 2021-02-08 MED ORDER — IOHEXOL 300 MG/ML  SOLN
50.0000 mL | Freq: Once | INTRAMUSCULAR | Status: AC | PRN
  Administered 2021-02-08: 10 mL

## 2021-02-08 NOTE — Telephone Encounter (Signed)
Rec'd return call from daughter and have scheduled an In-home Palliative f/u visit for 02/22/21 @ 9 AM.

## 2021-02-08 NOTE — Telephone Encounter (Signed)
Returned call to daughter, [name], and left a message requesting a return call to schedule the Palliative f/u visit.  Left my name and call back number.

## 2021-02-08 NOTE — Telephone Encounter (Signed)
Rec'd call from patient's daughter, [name], stating that she was returning a call to [facility], to schedule a Palliative visit.  Told her that I would give Lexine Baton the message and either she or I would call her back to schedule visit and she was in agreement with this.

## 2021-02-17 ENCOUNTER — Other Ambulatory Visit: Payer: Self-pay | Admitting: Urology

## 2021-02-20 MED ORDER — GEMCITABINE CHEMO FOR BLADDER INSTILLATION 2000 MG: 2000.0000 mg | Freq: Once | INTRAVENOUS | Status: AC

## 2021-02-22 ENCOUNTER — Other Ambulatory Visit: Payer: Self-pay

## 2021-02-22 ENCOUNTER — Other Ambulatory Visit: Payer: Medicare Other | Admitting: Student

## 2021-02-22 DIAGNOSIS — R0609 Other forms of dyspnea: Secondary | ICD-10-CM

## 2021-02-22 DIAGNOSIS — R319 Hematuria, unspecified: Secondary | ICD-10-CM

## 2021-02-22 DIAGNOSIS — R600 Localized edema: Secondary | ICD-10-CM

## 2021-02-22 DIAGNOSIS — Z515 Encounter for palliative care: Secondary | ICD-10-CM

## 2021-02-22 DIAGNOSIS — R0600 Dyspnea, unspecified: Secondary | ICD-10-CM

## 2021-02-22 NOTE — Progress Notes (Signed)
[facility] Palliative Care Consult Note Telephone: [phone]  Fax: [phone]    Date of encounter: 02/22/21 PATIENT NAME: Vincent Robinson [address] [place] [address]   [phone] (home)  DOB: 30-May-1923 MRN: [id number] PRIMARY CARE PROVIDER:    Katherina Mires, MD,  [place] [address] [phone]  REFERRING PROVIDER:   Katherina Mires, MD [place],  [place] [address] [phone]  RESPONSIBLE PARTY:    Contact Information    Name Relation Home Work Kane Daughter [phone]  [phone]   Jaidon, Ellery [phone]     Higginbotham,Debra Daughter [phone]  [phone]       I met face to face with patient and family in the home. Palliative Care was asked to follow this patient by consultation request of  Katherina Mires, MD to address advance care planning and complex medical decision making. This is the initial visit.                                     ASSESSMENT AND PLAN / RECOMMENDATIONS:   Advance Care Planning/Goals of Care: Goals include to maximize quality of life and symptom management. Wants to maintain his independence. Our advance care planning conversation included a discussion about:     The value and importance of advance care planning   Experiences with loved ones who have been seriously ill or have died   Exploration of personal, cultural or spiritual beliefs that might influence medical decisions   Exploration of goals of care in the event of a sudden injury or illness   Identification and preparation of a healthcare agent   MOST form reviewed; no changes.  Decision not to resuscitate or to de-escalate disease focused treatments due to poor prognosis.  CODE STATUS: Full Code  Symptom Management/Plan:  Dyspnea-secondary to COPD, heart failure. Continue oxygen at 3lpm, continue torsemide and  inhalers as directed.  Edema secondary to heart failure-continue torsemide as directed, compression stockings daily, elevate legs when resting or in bed. Continue daily weights. Continue macitentan and sildenafil for his pulmonary hypertension. Follow up with Cardiology as scheduled.  Hematuria-secondary to bladder mass. Upcoming procedure on 5/19. Follow up with urology as scheduled.   Biliary drain-secondary to cholecystitis. Not a surgical candidate. Drain to be changed out every 6 weeks. [facility] nurse referral placed per PCP to help maintain, flush.  Discussed supplemental caregivers in the home; patient declines at this time.    Follow up Palliative Care Visit: Palliative care will continue to follow for complex medical decision making, advance care planning, and clarification of goals. Return in 6 weeks or prn.  I spent 75 minutes providing this consultation. More than 50% of the time in this consultation was spent in counseling and care coordination.    PPS: 60%  HOSPICE ELIGIBILITY/DIAGNOSIS: TBD  Chief Complaint: Palliative Medicine initial visit.  HISTORY OF PRESENT ILLNESS:  Vincent Robinson is a 87 y.o. year old male  with right sided congestive heart failure, COPD, CAD s/p CABG, pulmonary hypertension, moderate aortic stenosis, atrial fibrillation-not a candidate for oral anticoagulation d/t increased bleeding risk. Dx also include anemia, bladder mass, biliary drain d/t cholecystitis. Patient hospitalized January 2022 due to Covid 19 infection and aspiration pneumonia. Patient reports having hematuria x past 3 months; he is to have procedure on 5/19. Completed  antibiotics 2 weeks ago for urinary tract infection 2 weeks ago. Hematuria var, occasional pain with urination. No fever, chills, nausea. Chronic biliary drain changed every 6 weeks. Wears oxygen continuously at 3lpm; reports dyspnea with exertion. Wears at 2lpm when he goes out. Sleep is usually good. Appetite has declined; weighs  self almost daily. Weight is 146 pounds. Patient reports eating a lot of frozen meals, fruit. Has tried meals on wheels or other options, but not fond of them. Patient states PCP has contacted Home health nurse with [facility] to come in twice weekly to flush biliary drain. No recent falls or injury.   History obtained from review of EMR, discussion with primary team, and interview with family, facility staff/caregiver and/or Vincent Robinson.  I reviewed available labs, medications, imaging, studies and related documents from the EMR.  Records reviewed and summarized above.   ROS  General: NAD EYES: denies vision changes ENMT: denies dysphagia Cardiovascular: denies chest pain, DOE Pulmonary: denies cough, SOB with exertion Abdomen: endorses fair appetite, denies constipation GU: hematuria MSK:  denies weakness,  no falls reported Skin: denies rashes or wounds Neurological: denies insomnia Psych: Endorses positive mood Heme/lymph/immuno: denies bruises  Physical Exam: Current weight: 146 pounds Pulse 64, resp 20, b/p 110/50, sats 91% at 3lpm Constitutional: NAD General: frail appearing EYES: anicteric sclera, lids intact, no discharge  ENMT: intact hearing, oral mucous membranes moist CV: S1S2, irregular, 2+ non pitting edema Pulmonary: fine crackles at bases, dyspnea with exertion, no cough Abdomen:  normo-active BS + 4 quadrants, soft and non tender, biliary drain to right abdomen, bile drainage present GU: deferred MSK: moves all extremities, ambulatory with rollator Skin: warm and dry, no rashes or wounds on visible skin Neuro:  generalized weakness Psych: non-anxious affect, A & O x 3 Hem/lymph/immuno: no widespread bruising   CURRENT PROBLEM LIST:  Patient Active Problem List   Diagnosis Date Noted  . Pneumonia due to COVID-19 virus 11/14/2020  . Elevated troponin 11/14/2020  . Bladder mass 11/14/2020  . Acute cholecystitis 09/08/2020  . Diverticular hemorrhage   . Rectal  bleeding 03/22/2020  . Acute GI bleeding 03/22/2020  . Hypokalemia   . Hypotension   . Acute respiratory failure with hypoxia ([place]) 12/18/2019  . Acute on chronic right-sided heart failure ([place]) 12/18/2019  . Pressure injury of skin 11/24/2019  . Chronic respiratory failure ([place])   . Acute on chronic respiratory failure ([place]) 11/23/2019  . Right heart failure ([place])   . S/P CABG (coronary artery bypass graft) 11/22/2019  . Chest pain 11/22/2019  . Atrial fibrillation ([place]) 08/04/2019  . Pulmonary hypertension, unspecified ([place]) 10/11/2017  . Aortic stenosis, moderate 10/11/2017  . Coronary artery disease 08/30/2017  . AAA (abdominal aortic aneurysm) without rupture ([place]) 08/30/2017  . Acute blood loss anemia 06/07/2017  . Hemorrhagic shock ([place]) 06/07/2017  . Lower GI bleed 06/05/2017  . Sleep apnea 06/05/2017  . Hypothyroidism 06/05/2017  . Hyperlipidemia 06/05/2017  . Asthma 06/05/2017  . ILD (interstitial lung disease) ([place]) 05/28/2017  . Pneumothorax on right 05/20/2017  . COPD (chronic obstructive pulmonary disease) ([place]) 05/20/2017  . Pneumothorax 05/20/2017  . Skin cancer 10/23/2015   PAST MEDICAL HISTORY:  Active Ambulatory Problems    Diagnosis Date Noted  . Skin cancer 10/23/2015  . Pneumothorax on right 05/20/2017  . COPD (chronic obstructive pulmonary disease) ([place]) 05/20/2017  . Pneumothorax 05/20/2017  . ILD (interstitial lung disease) ([place]) 05/28/2017  . Lower GI bleed 06/05/2017  . Sleep apnea 06/05/2017  .  Hypothyroidism 06/05/2017  . Hyperlipidemia 06/05/2017  . Asthma 06/05/2017  . Acute blood loss anemia 06/07/2017  . Hemorrhagic shock ([place]) 06/07/2017  . Coronary artery disease 08/30/2017  . AAA (abdominal aortic aneurysm) without rupture ([place]) 08/30/2017  . Pulmonary hypertension, unspecified ([place]) 10/11/2017  . Aortic stenosis, moderate 10/11/2017  . Atrial fibrillation ([place]) 08/04/2019  . S/P CABG (coronary artery bypass graft) 11/22/2019  .  Chest pain 11/22/2019  . Acute on chronic respiratory failure ([place]) 11/23/2019  . Right heart failure ([place])   . Pressure injury of skin 11/24/2019  . Chronic respiratory failure ([place])   . Acute respiratory failure with hypoxia ([place]) 12/18/2019  . Acute on chronic right-sided heart failure ([place]) 12/18/2019  . Hypokalemia   . Hypotension   . Rectal bleeding 03/22/2020  . Acute GI bleeding 03/22/2020  . Diverticular hemorrhage   . Acute cholecystitis 09/08/2020  . Pneumonia due to COVID-19 virus 11/14/2020  . Elevated troponin 11/14/2020  . Bladder mass 11/14/2020   Resolved Ambulatory Problems    Diagnosis Date Noted  . Rectal bleeding 07/12/2019   Past Medical History:  Diagnosis Date  . AAA (abdominal aortic aneurysm) ([place])   . Acute respiratory failure ([place]) 11/23/2019  . Basal cell carcinoma   . CHF (congestive heart failure) ([place])   . History of chronic respiratory failure    SOCIAL HX:  Social History   Tobacco Use  . Smoking status: Former Smoker    Packs/day: 0.50    Quit date: 08/06/1961    Years since quitting: 59.5  . Smokeless tobacco: Never Used  Substance Use Topics  . Alcohol use: Not Currently   FAMILY HX:  Family History  Problem Relation Age of Onset  . Emphysema Mother   . Stroke Father   . Movement disorder Sister   . Aneurysm Brother        Thoracic aorta  . Macular degeneration Brother      ALLERGIES: No Known Allergies   PERTINENT MEDICATIONS:  Outpatient Encounter Medications as of 02/22/2021  Medication Sig  . fluticasone (FLONASE) 50 MCG/ACT nasal spray Place 1 spray into both nostrils daily as needed for allergies or rhinitis.  . Fluticasone-Salmeterol,sensor, 113-14 MCG/ACT AEPB Inhale 1 puff into the lungs 2 (two) times daily.  . Ipratropium-Albuterol (ALBUTEROL-IPRATROPIUM IN) Inhale 1 Dose into the lungs daily as needed.  Marland Kitchen levothyroxine (SYNTHROID) 100 MCG tablet Take 1 tablet (100 mcg total) by mouth daily at 6 (six) AM.  .  loratadine (CLARITIN) 10 MG tablet Take 10 mg by mouth daily as needed for allergies.   . macitentan (OPSUMIT) 10 MG tablet Take 1 tablet (10 mg total) by mouth daily. Needs appt for future refills  . midodrine (PROAMATINE) 10 MG tablet Take 1 tablet (10 mg total) by mouth 3 (three) times daily after meals. In addition with the 48m tablets  . midodrine (PROAMATINE) 5 MG tablet Take 1 tablet (5 mg total) by mouth 3 (three) times daily with meals. In addition with the 188mtablets  . montelukast (SINGULAIR) 10 MG tablet Take 10 mg by mouth at bedtime.  . pantoprazole (PROTONIX) 20 MG tablet Take 20 mg by mouth daily.  . potassium chloride SA (KLOR-CON) 20 MEQ tablet Take 20 mEq by mouth in the morning and at bedtime.  . sildenafil (REVATIO) 20 MG tablet TAKE TWO TABLETS BY MOUTH THREE TIMES A DAY  . simvastatin (ZOCOR) 10 MG tablet Take 10 mg by mouth at bedtime.  . Sodium Chloride Flush (NORMAL SALINE  FLUSH) 0.9 % SOLN USE 5 MLS 2 TIMES DAILY AS DIRECTED  . torsemide (DEMADEX) 20 MG tablet TAKE TWO TABLETS BY MOUTH DAILY   Facility-Administered Encounter Medications as of 02/22/2021  Medication  . gemcitabine (GEMZAR) chemo syringe for bladder instillation 2,000 mg    Thank you for the opportunity to participate in the care of Mr. Verdell.  The palliative care team will continue to follow. Please call our office at [phone] if we can be of additional assistance.    Charlann Boxer, NP   COVID-19 PATIENT SCREENING TOOL Asked and negative response unless otherwise noted:   Have you had symptoms of covid, tested positive or been in contact with someone with symptoms/positive test in the past 5-10 days? No

## 2021-03-02 NOTE — Progress Notes (Addendum)
PCP - Suzanna Obey, MD Cass[facility] Clement 02-15-21  Cardiologist - LOV 12-28-20 Quay Burow, MD, Dr. Phillip Heal lov 12-29-20,  Pulm- Preveen Mannam lov 11-16-19   PPM/ICD -  Device Orders -  Rep Notified -   Chest x-ray - CT abd/pelvis 11-14-20 EKG - 12-28-20 Stress Test - 2019 ECHO - 2021 Cardiac Cath -   Sleep Study -  CPAP -   Fasting Blood Sugar -  Checks Blood Sugar _____ times a day  Blood Thinner Instructions: Aspirin Instructions:  ERAS Protcol - PRE-SURGERY Ensure or G2-  Fully vaccinated Moderna COVID TEST- 5-17  Activity--Limited on O2 3 liters continous Walks around own home without SOB  On his oxygen sats usually run around 90's  Anesthesia review: CHF, CAD, CABG, OSA, AAA Pulm HTN, COPD, cholecystomy drain, Mod. Aortic stenosis, afib, Hgb 9.4  Patient denies shortness of breath, fever, cough and chest pain at PAT appointment   All instructions explained to the patient, with a verbal understanding of the material. Patient agrees to go over the instructions while at home for a better understanding. Patient also instructed to self quarantine after being tested for COVID-19. The opportunity to ask questions was provided.

## 2021-03-02 NOTE — Patient Instructions (Addendum)
DUE TO COVID-19 ONLY ONE VISITOR IS ALLOWED TO COME WITH YOU AND STAY IN THE WAITING ROOM ONLY DURING PRE OP AND PROCEDURE DAY OF SURGERY.   TWO VISITOR  MAY VISIT WITH YOU AFTER SURGERY IN YOUR PRIVATE ROOM DURING VISITING HOURS ONLY!  YOU NEED TO HAVE A COVID 19 TEST ON__5-16_____ @_______ , THIS TEST MUST BE DONE BEFORE SURGERY,    COVID TESTING SITE [address] [place] [address],   IT IS ON THE RIGHT GOING OUT [address] APPROXIMATELY  2 MINUTES PAST [employer] ON THE RIGHT. ONCE YOUR COVID TEST IS COMPLETED,  PLEASE BEGIN THE QUARANTINE INSTRUCTIONS AS OUTLINED IN YOUR HANDOUT.                Vincent Robinson  03/02/2021   Your procedure is scheduled on: 03-09-21   Report to [facility] Main  Entrance   Report to admitting at     0830 AM     Call this number if you have problems the morning of surgery [phone]    Remember: Do not eat food  :After Midnight. You may have clear liquids    until 0730 am then nothing by mouth    CLEAR LIQUID DIET   Foods Allowed                                                                            Foods Excluded  Black Coffee and tea, regular and decaf                             liquids that you cannot  Plain Jell-O any favor except red or purple                                           see through such as: Fruit ices (not with fruit pulp)                                                     milk, soups, orange juice  Iced Popsicles                                                             All solid food Carbonated beverages, regular and diet                                    Cranberry, grape and apple juices Sports drinks like Gatorade Lightly seasoned clear broth or consume(fat free) Sugar, honey syrup    BRUSH YOUR TEETH MORNING OF SURGERY AND RINSE YOUR MOUTH OUT, NO CHEWING GUM CANDY OR MINTS.  Take these medicines the morning of surgery with A SIP OF WATER: inhalers ( bring with  you), levothyroxine, nebulizer if needed, Opsumit, midodrine, protonix, Revatio                                 You may not have any metal on your body including hair pins and              piercings  Do not wear jewelry, lotions, powders or perfumes, deodorant                     Men may shave face and neck.   Do not bring valuables to the hospital. [place].  Contacts, dentures or bridgework may not be worn into surgery. .     Patients discharged the day of surgery will not be allowed to drive home. IF YOU ARE HAVING SURGERY AND GOING HOME THE SAME DAY, YOU MUST HAVE AN ADULT TO DRIVE YOU HOME AND BE WITH YOU FOR 24 HOURS. YOU MAY GO HOME BY TAXI OR UBER OR ORTHERWISE, BUT AN ADULT MUST ACCOMPANY YOU HOME AND STAY WITH YOU FOR 24 HOURS.  Name and phone number of your driver:  Special Instructions: N/A              Please read over the following fact sheets you were given: _____________________________________________________________________             [facility] - Preparing for Surgery Before surgery, you can play an important role.  Because skin is not sterile, your skin needs to be as free of germs as possible.  You can reduce the number of germs on your skin by washing with CHG (chlorahexidine gluconate) soap before surgery.  CHG is an antiseptic cleaner which kills germs and bonds with the skin to continue killing germs even after washing. Please DO NOT use if you have an allergy to CHG or antibacterial soaps.  If your skin becomes reddened/irritated stop using the CHG and inform your nurse when you arrive at Short Stay. Do not shave (including legs and underarms) for at least 48 hours prior to the first CHG shower.  You may shave your face/neck. Please follow these instructions carefully:  1.  Shower with CHG Soap the night before surgery and the  morning of Surgery.  2.  If you choose to wash your hair, wash your hair first as  usual with your  normal  shampoo.  3.  After you shampoo, rinse your hair and body thoroughly to remove the  shampoo.                           4.  Use CHG as you would any other liquid soap.  You can apply chg directly  to the skin and wash                       Gently with a scrungie or clean washcloth.  5.  Apply the CHG Soap to your body ONLY FROM THE NECK DOWN.   Do not use on face/ open                           Wound or  open sores. Avoid contact with eyes, ears mouth and genitals (private parts).                       Wash face,  Genitals (private parts) with your normal soap.             6.  Wash thoroughly, paying special attention to the area where your surgery  will be performed.  7.  Thoroughly rinse your body with warm water from the neck down.  8.  DO NOT shower/wash with your normal soap after using and rinsing off  the CHG Soap.                9.  Pat yourself dry with a clean towel.            10.  Wear clean pajamas.            11.  Place clean sheets on your bed the night of your first shower and do not  sleep with pets. Day of Surgery : Do not apply any lotions/deodorants the morning of surgery.  Please wear clean clothes to the hospital/surgery center.  FAILURE TO FOLLOW THESE INSTRUCTIONS MAY RESULT IN THE CANCELLATION OF YOUR SURGERY PATIENT SIGNATURE_________________________________  NURSE SIGNATURE__________________________________  ________________________________________________________________________

## 2021-03-06 ENCOUNTER — Encounter (HOSPITAL_COMMUNITY)
Admission: RE | Admit: 2021-03-06 | Discharge: 2021-03-06 | Disposition: A | Discharge status: Home / Self Care | Payer: Medicare Other | Source: Ambulatory Visit | Attending: Urology | Admitting: Urology

## 2021-03-06 ENCOUNTER — Encounter (HOSPITAL_COMMUNITY): Payer: Self-pay

## 2021-03-06 ENCOUNTER — Other Ambulatory Visit (HOSPITAL_COMMUNITY)
Admission: RE | Admit: 2021-03-06 | Discharge: 2021-03-06 | Disposition: A | Discharge status: Home / Self Care | Payer: Medicare Other | Source: Ambulatory Visit | Attending: Urology | Admitting: Urology

## 2021-03-06 ENCOUNTER — Other Ambulatory Visit: Payer: Self-pay

## 2021-03-06 DIAGNOSIS — G4730 Sleep apnea, unspecified: Secondary | ICD-10-CM | POA: Insufficient documentation

## 2021-03-06 DIAGNOSIS — I110 Hypertensive heart disease with heart failure: Secondary | ICD-10-CM | POA: Diagnosis not present

## 2021-03-06 DIAGNOSIS — C679 Malignant neoplasm of bladder, unspecified: Secondary | ICD-10-CM | POA: Insufficient documentation

## 2021-03-06 DIAGNOSIS — Z951 Presence of aortocoronary bypass graft: Secondary | ICD-10-CM | POA: Insufficient documentation

## 2021-03-06 DIAGNOSIS — I4891 Unspecified atrial fibrillation: Secondary | ICD-10-CM | POA: Insufficient documentation

## 2021-03-06 DIAGNOSIS — Z87891 Personal history of nicotine dependence: Secondary | ICD-10-CM | POA: Insufficient documentation

## 2021-03-06 DIAGNOSIS — Z79899 Other long term (current) drug therapy: Secondary | ICD-10-CM | POA: Diagnosis not present

## 2021-03-06 DIAGNOSIS — Z01812 Encounter for preprocedural laboratory examination: Secondary | ICD-10-CM | POA: Insufficient documentation

## 2021-03-06 DIAGNOSIS — Z20822 Contact with and (suspected) exposure to covid-19: Secondary | ICD-10-CM | POA: Insufficient documentation

## 2021-03-06 DIAGNOSIS — I2510 Atherosclerotic heart disease of native coronary artery without angina pectoris: Secondary | ICD-10-CM | POA: Insufficient documentation

## 2021-03-06 DIAGNOSIS — Z9981 Dependence on supplemental oxygen: Secondary | ICD-10-CM | POA: Insufficient documentation

## 2021-03-06 DIAGNOSIS — J449 Chronic obstructive pulmonary disease, unspecified: Secondary | ICD-10-CM | POA: Insufficient documentation

## 2021-03-06 DIAGNOSIS — I509 Heart failure, unspecified: Secondary | ICD-10-CM | POA: Diagnosis not present

## 2021-03-06 HISTORY — DX: Nonrheumatic aortic (valve) stenosis: I35.0

## 2021-03-06 HISTORY — DX: Diverticulitis of intestine, part unspecified, without perforation or abscess without bleeding: K57.92

## 2021-03-06 HISTORY — DX: Pulmonary hypertension, unspecified: I27.20

## 2021-03-06 HISTORY — DX: Acute myocardial infarction, unspecified: I21.9

## 2021-03-06 HISTORY — DX: Cardiac arrhythmia, unspecified: I49.9

## 2021-03-06 HISTORY — DX: Pneumonia, unspecified organism: J18.9

## 2021-03-06 LAB — BASIC METABOLIC PANEL
Anion gap: 11 (ref 5–15)
BUN: 25 mg/dL — ABNORMAL HIGH (ref 8–23)
CO2: 28 mmol/L (ref 22–32)
Calcium: 8.6 mg/dL — ABNORMAL LOW (ref 8.9–10.3)
Chloride: 97 mmol/L — ABNORMAL LOW (ref 98–111)
Creatinine, Ser: 1.09 mg/dL (ref 0.61–1.24)
GFR, Estimated: >60 mL/min (ref >60)
Glucose, Bld: 98 mg/dL (ref 70–99)
Potassium: 3.6 mmol/L (ref 3.5–5.1)
Sodium: 136 mmol/L (ref 135–145)

## 2021-03-06 LAB — CBC
HCT: 29.5 % — ABNORMAL LOW (ref 39.0–52.0)
Hemoglobin: 9.4 g/dL — ABNORMAL LOW (ref 13.0–17.0)
MCH: 30 pg (ref 26.0–34.0)
MCHC: 31.9 g/dL (ref 30.0–36.0)
MCV: 94.2 fL (ref 80.0–100.0)
Platelets: 280 10*3/uL (ref 150–400)
RBC: 3.13 MIL/uL — ABNORMAL LOW (ref 4.22–5.81)
RDW: 13.4 % (ref 11.5–15.5)
WBC: 10.8 10*3/uL — ABNORMAL HIGH (ref 4.0–10.5)
nRBC: 0 % (ref 0.0–0.2)

## 2021-03-07 ENCOUNTER — Telehealth (HOSPITAL_COMMUNITY): Payer: Self-pay | Admitting: *Deleted

## 2021-03-07 ENCOUNTER — Encounter (HOSPITAL_COMMUNITY): Payer: Self-pay | Admitting: *Deleted

## 2021-03-07 LAB — SARS CORONAVIRUS 2 (TAT 6-24 HRS): SARS Coronavirus 2: NEGATIVE

## 2021-03-07 NOTE — Telephone Encounter (Signed)
Entered in error

## 2021-03-08 ENCOUNTER — Telehealth: Payer: Self-pay | Admitting: Cardiovascular Disease

## 2021-03-08 NOTE — Telephone Encounter (Signed)
   Primary Cardiologist: Quay Burow, MD  Chart reviewed as part of pre-operative protocol coverage.   87 y.o. male with  Coronary artery disease s/p CABG in 1998  Myoview 1/19: EF 58, no ischemia  Aortic stenosis (moderate)  Echocardiogram 09/08/20: EF 55-60, mod reduced RVSF, RVSP 81.6, BAE, mild MR, mod-severe TR, mod AS (mean 20 mmHg, Vmax 3 cm/sec, DI 0.29)  Abdominal aortic aneurysm s/p EVAR in 07/2008  ILD (Interstitial Lung Disease)   OSA  Cor Pulmonale   Pulmonary hypertension   Chronic O2  Rx with Sildenafil, Macitentan  Hyperlipidemia   Hx of spontaneous pneumothorax  Hx of LGI bleed (diverticular)  Permanent atrial fibrillation (not on a/c due to bleeding risk)  Hx of cholecystitis and sepsis  Hx of COVID-19 pneumonia   Last OV: 12/29/20, Dr. Haroldine Laws Procedure: TURBT Rx: none requested  RCRI:  Perioperative Risk of Major Cardiac Event is (%): 6.6 (high risk) DASI:  Functional Capacity in METs is: 4.4 (functional status is fair )  Patient was contacted 03/08/2021 in reference to pre-operative risk assessment for pending surgery as outlined below.    Since last seen, Vincent Robinson has been stable.  He has not had chest pain.  His breathing is stable.  He remains fairly independent.  His weights are stable.  Leg edema is stable.  He does not have orthopnea.  I reviewed his case with Dr. Haroldine Laws.  The pt is too high risk to undergo general anesthesia.  Spinal or local anesthesia should be used.  Recommendations: . Based on ACC/AHA guidelines, the patient is at elevated but acceptable risk for the planned procedure.  Further cardiovascular testing is not needed.  . As noted, he should not be placed under general anesthesia.   Please call with questions. Richardson Dopp, PA-C 03/08/2021, 2:32 PM

## 2021-03-08 NOTE — Anesthesia Preprocedure Evaluation (Addendum)
Anesthesia Evaluation  Patient identified by MRN, date of birth, ID band Patient awake    Reviewed: Allergy & Precautions, NPO status , Patient's Chart, lab work & pertinent test results  Airway Mallampati: III  TM Distance: >3 FB Neck ROM: Limited    Dental  (+) Lower Dentures, Upper Dentures, Dental Advisory Given   Pulmonary asthma , sleep apnea , pneumonia, COPD, former smoker,    Pulmonary exam normal breath sounds clear to auscultation       Cardiovascular + CAD, + Past MI and +CHF  + dysrhythmias Atrial Fibrillation + Valvular Problems/Murmurs AS  Rhythm:Irregular Rate:Normal + Systolic murmurs Echo 90/2409 1. Left ventricular ejection fraction, by estimation, is 60 to 65%. The left ventricle has normal function. There is mild left ventricular hypertrophy. Left ventricular diastolic parameters are consistent with Grade II diastolic dysfunction (pseudonormalization). Elevated left atrial pressure.  2. Right ventricular systolic function is moderately reduced. The right ventricular size is moderately enlarged. There is severely elevated pulmonary artery systolic pressure. The estimated right ventricular systolic pressure is 73.5 mmHg.  3. Left atrial size was mildly dilated.  4. Right atrial size was moderately dilated.  5. The mitral valve is degenerative. Mild mitral valve regurgitation.  6. Tricuspid valve regurgitation is severe.  7. The aortic valve is calcified. There is severe calcifcation of the aortic valve. There is severe thickening of the aortic valve. Aortic valve regurgitation is mild.  8. The inferior vena cava is dilated in size with <50% respiratory variability, suggesting right atrial pressure of 15 mmHg.    Neuro/Psych negative neurological ROS     GI/Hepatic negative GI ROS, Neg liver ROS,   Endo/Other  Hypothyroidism   Renal/GU negative Renal ROS     Musculoskeletal negative musculoskeletal  ROS (+)   Abdominal   Peds  Hematology negative hematology ROS (+) anemia ,   Anesthesia Other Findings   Reproductive/Obstetrics                           Anesthesia Physical Anesthesia Plan  ASA: IV  Anesthesia Plan: General   Post-op Pain Management:    Induction: Intravenous  PONV Risk Score and Plan: 2 and Ondansetron, Dexamethasone and Treatment may vary due to age or medical condition  Airway Management Planned: Oral ETT  Additional Equipment: None  Intra-op Plan:   Post-operative Plan: Extubation in OR  Informed Consent: I have reviewed the patients History and Physical, chart, labs and discussed the procedure including the risks, benefits and alternatives for the proposed anesthesia with the patient or authorized representative who has indicated his/her understanding and acceptance.     Dental advisory given  Plan Discussed with: CRNA  Anesthesia Plan Comments: (See PAT note 03/06/2021, Konrad Felix, PA-C)       Anesthesia Quick Evaluation

## 2021-03-08 NOTE — Telephone Encounter (Signed)
    [facility] Pre-operative Risk Assessment    Request for surgical clearance:  1. What type of surgery is being performed? transurtethral resection of bladder tumor   2. When is this surgery scheduled? tomorrow  3. What type of clearance is required (medical clearance vs. Pharmacy clearance to hold med vs. Both)? Medical  4. Are there any medications that need to be held prior to surgery and how long? No   5. Practice name and name of physician performing surgery? Dr.Dahlstedt  6. What is the office phone number? [phone]   7.   What is the office fax number? [phone]  8.   Anesthesia type (None, local, MAC, general) ? General    Vincent Robinson 03/08/2021, 10:25 AM  _________________________________________________________________   (provider comments below)

## 2021-03-08 NOTE — H&P (Signed)
H&P  Chief Complaint: Gross hematuria, probable bladder cancer  History of Present Illness: 87 year old male with significant cardiac history presents at this time for cystoscopy and palliative resection of a right sided bladder tumor.  The patient has been followed for some time with this, and due to his age and cardiac status it was recommended that we take a palliative approach and only resect this tumor if he has significant complications from it.  The patient has had significant bleeding over the past several weeks necessitating multiple office visits.  The patient now has clearance to have this procedure done, although he is at fairly high risk for perioperative cardiac event.  Dr. Haroldine Laws has been consulted and has given his clearance for this procedure.  Past Medical History:  Diagnosis Date  . AAA (abdominal aortic aneurysm) ([place])   . Acute respiratory failure ([place]) 11/23/2019  . Asthma   . Basal cell carcinoma   . CHF (congestive heart failure) ([place])   . Chronic respiratory failure ([place])   . COPD (chronic obstructive pulmonary disease) ([place])   . Coronary artery disease   . Diverticulitis   . Dysrhythmia    afib  . History of chronic respiratory failure   . Hyperlipidemia   . Hypothyroidism   . Moderate aortic stenosis   . Myocardial infarction ([place])   . Pneumonia   . Pulmonary HTN ([place])   . Right heart failure ([place])   . Skin cancer 2017   bladder   . Sleep apnea    Bipap    Past Surgical History:  Procedure Laterality Date  . bleeding ulcer  2016  . BYPASS AXILLA/BRACHIAL ARTERY  1998  . colon bleed  2012  . COLONOSCOPY N/A 06/07/2017   Procedure: COLONOSCOPY;  Surgeon: Carol Ada, MD;  Location: [facility];  Service: Endoscopy;  Laterality: N/A;  . CORONARY ARTERY BYPASS GRAFT     5 vessel  . DEBRIDMENT OF DECUBITUS ULCER  2002  . EYE SURGERY     cataract  . IR ANGIOGRAM SELECTIVE EACH ADDITIONAL VESSEL  03/22/2020  . IR ANGIOGRAM VISCERAL SELECTIVE   03/22/2020  . IR EXCHANGE BILIARY DRAIN  12/02/2020  . IR EXCHANGE BILIARY DRAIN  02/08/2021  . IR PERC CHOLECYSTOSTOMY  09/08/2020  . IR RADIOLOGIST EVAL & MGMT  10/06/2020  . IR RADIOLOGIST EVAL & MGMT  10/26/2020  . IR US GUIDE VASC ACCESS RIGHT  03/22/2020  . RIGHT HEART CATH N/A 12/11/2017   Procedure: RIGHT HEART CATH;  Surgeon: Jolaine Artist, MD;  Location: [facility];  Service: Cardiovascular;  Laterality: N/A;  . THORACIC AORTA STENT  2009    Home Medications:  Allergies as of 03/08/2021   No Known Allergies     Medication List    Notice   Cannot display discharge medications because the patient has not yet been admitted.     Allergies: No Known Allergies  Family History  Problem Relation Age of Onset  . Emphysema Mother   . Stroke Father   . Movement disorder Sister   . Aneurysm Brother        Thoracic aorta  . Macular degeneration Brother     Social History:  reports that he quit smoking about 59 years ago. He smoked 0.50 packs per day. He has never used smokeless tobacco. He reports previous alcohol use. He reports that he does not use drugs.  ROS: A complete review of systems was performed.  All systems are negative except for pertinent  findings as noted.  Physical Exam:  Vital signs in last 24 hours: There were no vitals taken for this visit. Constitutional:  Alert and oriented, No acute distress Cardiovascular: Regular rate  Respiratory: Normal respiratory effort GI: Abdomen is soft, nontender, nondistended, no abdominal masses. No CVAT.  Genitourinary: Normal male phallus, testes are descended bilaterally and non-tender and without masses, scrotum is normal in appearance without lesions or masses, perineum is normal on inspection. Lymphatic: No lymphadenopathy Neurologic: Grossly intact, no focal deficits Psychiatric: Normal mood and affect  Laboratory Data:  Recent Labs    03/06/21 1155  WBC 10.8*  HGB 9.4*  HCT 29.5*  PLT 280     Recent Labs    03/06/21 1156  NA 136  K 3.6  CL 97*  GLUCOSE 98  BUN 25*  CALCIUM 8.6*  CREATININE 1.09     No results found for this or any previous visit (from the past 24 hour(s)). Recent Results (from the past 240 hour(s))  SARS CORONAVIRUS 2 (TAT 6-24 HRS) Nasopharyngeal Nasopharyngeal Swab     Status: None   Collection Time: 03/06/21 12:42 PM   Specimen: Nasopharyngeal Swab  Result Value Ref Range Status   SARS Coronavirus 2 NEGATIVE NEGATIVE Final    Comment: (NOTE) SARS-CoV-2 target nucleic acids are NOT DETECTED.  The SARS-CoV-2 RNA is generally detectable in upper and lower respiratory specimens during the acute phase of infection. Negative results do not preclude SARS-CoV-2 infection, do not rule out co-infections with other pathogens, and should not be used as the sole basis for treatment or other patient management decisions. Negative results must be combined with clinical observations, patient history, and epidemiological information. The expected result is Negative.  Fact Sheet for Patients: SugarRoll.be  Fact Sheet for Healthcare Providers: https://www.woods-mathews.com/  This test is not yet approved or cleared by the [place] FDA and  has been authorized for detection and/or diagnosis of SARS-CoV-2 by FDA under an Emergency Use Authorization (EUA). This EUA will remain  in effect (meaning this test can be used) for the duration of the COVID-19 declaration under Se ction 564(b)(1) of the Act, 21 U.S.C. section 360bbb-3(b)(1), unless the authorization is terminated or revoked sooner.  Performed at [facility], [place] [address], [place], [place] [address]     Renal Function: Recent Labs    03/06/21 1156  CREATININE 1.09   Estimated Creatinine Clearance: 34.2 mL/min (by C-G formula based on SCr of 1.09 mg/dL).  Radiologic Imaging: No results found.  Impression/Assessment:  Gross  hematuria with right-sided bladder lesion, most likely urothelial carcinoma  Plan:  Transurethral resection of bladder tumor

## 2021-03-08 NOTE — Progress Notes (Addendum)
Anesthesia Chart Review   Case: [id number] Date/Time: 03/09/21 1015   Procedure: TRANSURETHRAL RESECTION OF BLADDER TUMOR WITH GEMCITABINE (N/A )   Anesthesia type: Choice   Pre-op diagnosis: BLADDER CANCER   Location: [facility]   Surgeons: Franchot Gallo, MD      DISCUSSION:87 y.o. former smoker with h/o asthma, COPD (pt uses 3L O2 at night), sleep apnea (bipap), a-fib (no anticoagulated due to increased risk of bleeding), moderate AS (Vmax 3.0 m/s, MG 20 mmHg, AVA 1.45 cm^2, DI 0.29 on Echo 09/08/2020), CHF, CAD (CABG 1998, negative stress test 10/23/2017), pulmonary HTN, AAA s/p repair 2009, bladder cancer scheduled for above procedure 03/09/2021 with Dr. Franchot Gallo.   Hospitalized in 1/22 due COVID and aspiration PNA. He has recovered from this per notes.  O2 use has been since 2017.   Pt is currently under palliative care.   Pt with biliary drain in place due to cholecystitis.  Pt not a surgical candidate per cardiology.  Drain is changed every 6 weeks.   Pt last seen in heart failure clinic 12/29/2020. Per OV note volume status stable with daily torsemide.   Seen by primary cardiologist, Dr. Haroldine Laws, 12/29/2020. Cardiac clearance requested.   Addendum 03/08/2021:  Per cardiology note, "RCRI:  Perioperative Risk of Major Cardiac Event is (%): 6.6 (high risk) DASI:  Functional Capacity in METs is: 4.4 (functional status is fair )  Patient was contacted 03/08/2021 in reference to pre-operative risk assessment for pending surgery as outlined below.    Since last seen, Vincent Robinson has been stable.  He has not had chest pain.  His breathing is stable.  He remains fairly independent.  His weights are stable.  Leg edema is stable.  He does not have orthopnea.  I reviewed his case with Dr. Haroldine Laws.  The pt is too high risk to undergo general anesthesia.  Spinal or local anesthesia should be used.  Recommendations:  Based on ACC/AHA guidelines, the patient  is at elevated but acceptable risk for the planned procedure.  Further cardiovascular testing is not needed.   As noted, he should not be placed under general anesthesia."   Discussed with Dr. Marcie Bal.  VS: BP (!) 92/47   Pulse 65   Temp 36.6 C (Oral)   Resp 16   Ht 5' 2.01" (1.575 m)   Wt 67.1 kg   SpO2 90%   BMI 27.06 kg/m   PROVIDERS: Katherina Mires, MD is PCP   Quay Burow, MD is Cardiologist  Glori Bickers, MD is Cardiologist  LABS: Labs reviewed: Acceptable for surgery. (all labs ordered are listed, but only abnormal results are displayed)  Labs Reviewed  BASIC METABOLIC PANEL - Abnormal; Notable for the following components:      Result Value   Chloride 97 (*)    BUN 25 (*)    Calcium 8.6 (*)    All other components within normal limits  CBC - Abnormal; Notable for the following components:   WBC 10.8 (*)    RBC 3.13 (*)    Hemoglobin 9.4 (*)    HCT 29.5 (*)    All other components within normal limits     IMAGES:   EKG: 12/28/2020 Rate 67 bpm    CV: Echo 09/15/2020 1. Left ventricular ejection fraction, by estimation, is 60 to 65%. The  left ventricle has normal function. There is mild left ventricular  hypertrophy. Left ventricular diastolic parameters are consistent with  Grade II diastolic dysfunction  (  pseudonormalization). Elevated left atrial pressure.  2. Right ventricular systolic function is moderately reduced. The right  ventricular size is moderately enlarged. There is severely elevated  pulmonary artery systolic pressure. The estimated right ventricular  systolic pressure is 37.0 mmHg.  3. Left atrial size was mildly dilated.  4. Right atrial size was moderately dilated.  5. The mitral valve is degenerative. Mild mitral valve regurgitation.  6. Tricuspid valve regurgitation is severe.  7. The aortic valve is calcified. There is severe calcifcation of the  aortic valve. There is severe thickening of the aortic valve.  Aortic valve  regurgitation is mild.  8. The inferior vena cava is dilated in size with <50% respiratory  variability, suggesting right atrial pressure of 15 mmHg.  Stress Test 10/23/2017   The left ventricular ejection fraction is normal (55-65%).  Nuclear stress EF: 58%.  There was no ST segment deviation noted during stress.  The study is normal.  This is a low risk study.   Normal pharmacologic nuclear stress test with no evidence for prior infarct or ischemia.  Past Medical History:  Diagnosis Date  . AAA (abdominal aortic aneurysm) ([place])   . Acute respiratory failure ([place]) 11/23/2019  . Asthma   . Basal cell carcinoma   . CHF (congestive heart failure) ([place])   . Chronic respiratory failure ([place])   . COPD (chronic obstructive pulmonary disease) ([place])   . Coronary artery disease   . Diverticulitis   . Dysrhythmia    afib  . History of chronic respiratory failure   . Hyperlipidemia   . Hypothyroidism   . Moderate aortic stenosis   . Myocardial infarction ([place])   . Pneumonia   . Pulmonary HTN ([place])   . Right heart failure ([place])   . Skin cancer 2017   bladder   . Sleep apnea    Bipap    Past Surgical History:  Procedure Laterality Date  . bleeding ulcer  2016  . BYPASS AXILLA/BRACHIAL ARTERY  1998  . colon bleed  2012  . COLONOSCOPY N/A 06/07/2017   Procedure: COLONOSCOPY;  Surgeon: Carol Ada, MD;  [facility];  Service: Endoscopy;  Laterality: N/A;  . CORONARY ARTERY BYPASS GRAFT     5 vessel  . DEBRIDMENT OF DECUBITUS ULCER  2002  . EYE SURGERY     cataract  . IR ANGIOGRAM SELECTIVE EACH ADDITIONAL VESSEL  03/22/2020  . IR ANGIOGRAM VISCERAL SELECTIVE  03/22/2020  . IR EXCHANGE BILIARY DRAIN  12/02/2020  . IR EXCHANGE BILIARY DRAIN  02/08/2021  . IR PERC CHOLECYSTOSTOMY  09/08/2020  . IR RADIOLOGIST EVAL & MGMT  10/06/2020  . IR RADIOLOGIST EVAL & MGMT  10/26/2020  . IR US GUIDE VASC ACCESS RIGHT  03/22/2020  . RIGHT HEART CATH N/A 12/11/2017    Procedure: RIGHT HEART CATH;  Surgeon: Jolaine Artist, MD;  Location: [facility];  Service: Cardiovascular;  Laterality: N/A;  . THORACIC AORTA STENT  2009    MEDICATIONS: . fluticasone (FLONASE) 50 MCG/ACT nasal spray  . Fluticasone-Salmeterol,sensor, 113-14 MCG/ACT AEPB  . ipratropium-albuterol (DUONEB) 0.5-2.5 (3) MG/3ML SOLN  . levothyroxine (SYNTHROID) 100 MCG tablet  . loratadine (CLARITIN) 10 MG tablet  . macitentan (OPSUMIT) 10 MG tablet  . midodrine (PROAMATINE) 10 MG tablet  . midodrine (PROAMATINE) 5 MG tablet  . montelukast (SINGULAIR) 10 MG tablet  . pantoprazole (PROTONIX) 20 MG tablet  . potassium chloride SA (KLOR-CON) 20 MEQ tablet  . sildenafil (REVATIO) 20 MG tablet  .  simvastatin (ZOCOR) 10 MG tablet  . Sodium Chloride Flush (NORMAL SALINE FLUSH) 0.9 % SOLN  . torsemide (DEMADEX) 20 MG tablet   No current facility-administered medications for this encounter.   Marland Kitchen gemcitabine (GEMZAR) chemo syringe for bladder instillation 2,000 mg   Konrad Felix, PA-C [facility] [phone]

## 2021-03-08 NOTE — Telephone Encounter (Signed)
Notes faxed to surgeon. This phone note will be removed from the preop pool. Richardson Dopp, PA-C  03/08/2021 2:46 PM

## 2021-03-09 ENCOUNTER — Encounter (HOSPITAL_COMMUNITY): Payer: Self-pay | Admitting: Urology

## 2021-03-09 ENCOUNTER — Observation Stay (HOSPITAL_COMMUNITY)
Admission: RE | Admit: 2021-03-09 | Discharge: 2021-03-10 | Disposition: A | Discharge status: Home / Self Care | Payer: Medicare Other | Attending: Urology | Admitting: Urology

## 2021-03-09 ENCOUNTER — Ambulatory Visit (HOSPITAL_COMMUNITY): Payer: Medicare Other | Admitting: Emergency Medicine

## 2021-03-09 ENCOUNTER — Ambulatory Visit (HOSPITAL_COMMUNITY): Payer: Medicare Other | Admitting: Certified Registered"

## 2021-03-09 ENCOUNTER — Other Ambulatory Visit: Payer: Self-pay

## 2021-03-09 ENCOUNTER — Encounter (HOSPITAL_COMMUNITY)
Admission: RE | Disposition: A | Discharge status: Home / Self Care | Payer: Self-pay | Source: Home / Self Care | Attending: Urology

## 2021-03-09 DIAGNOSIS — R310 Gross hematuria: Secondary | ICD-10-CM | POA: Diagnosis present

## 2021-03-09 DIAGNOSIS — J449 Chronic obstructive pulmonary disease, unspecified: Secondary | ICD-10-CM | POA: Insufficient documentation

## 2021-03-09 DIAGNOSIS — Z951 Presence of aortocoronary bypass graft: Secondary | ICD-10-CM | POA: Insufficient documentation

## 2021-03-09 DIAGNOSIS — I2510 Atherosclerotic heart disease of native coronary artery without angina pectoris: Secondary | ICD-10-CM | POA: Diagnosis not present

## 2021-03-09 DIAGNOSIS — I110 Hypertensive heart disease with heart failure: Secondary | ICD-10-CM | POA: Insufficient documentation

## 2021-03-09 DIAGNOSIS — J45909 Unspecified asthma, uncomplicated: Secondary | ICD-10-CM | POA: Insufficient documentation

## 2021-03-09 DIAGNOSIS — Z87891 Personal history of nicotine dependence: Secondary | ICD-10-CM | POA: Diagnosis not present

## 2021-03-09 DIAGNOSIS — C678 Malignant neoplasm of overlapping sites of bladder: Secondary | ICD-10-CM | POA: Diagnosis present

## 2021-03-09 DIAGNOSIS — I509 Heart failure, unspecified: Secondary | ICD-10-CM | POA: Insufficient documentation

## 2021-03-09 DIAGNOSIS — E039 Hypothyroidism, unspecified: Secondary | ICD-10-CM | POA: Diagnosis not present

## 2021-03-09 DIAGNOSIS — C679 Malignant neoplasm of bladder, unspecified: Principal | ICD-10-CM | POA: Insufficient documentation

## 2021-03-09 HISTORY — PX: TRANSURETHRAL RESECTION OF BLADDER TUMOR WITH MITOMYCIN-C: SHX6459

## 2021-03-09 LAB — HEPATIC FUNCTION PANEL
ALT: 14 U/L (ref 0–44)
AST: 21 U/L (ref 15–41)
Albumin: 3.1 g/dL — ABNORMAL LOW (ref 3.5–5.0)
Alkaline Phosphatase: 56 U/L (ref 38–126)
Bilirubin, Direct: 0.2 mg/dL (ref 0.0–0.2)
Indirect Bilirubin: 0.5 mg/dL (ref 0.3–0.9)
Total Bilirubin: 0.7 mg/dL (ref 0.3–1.2)
Total Protein: 6.7 g/dL (ref 6.5–8.1)

## 2021-03-09 SURGERY — TRANSURETHRAL RESECTION OF BLADDER TUMOR WITH MITOMYCIN-C
Anesthesia: General

## 2021-03-09 MED ORDER — ETOMIDATE 2 MG/ML IV SOLN
INTRAVENOUS | Status: DC | PRN
  Administered 2021-03-09: 8 mg via INTRAVENOUS

## 2021-03-09 MED ORDER — SUGAMMADEX SODIUM 200 MG/2ML IV SOLN
INTRAVENOUS | Status: DC | PRN
  Administered 2021-03-09: 200 mg via INTRAVENOUS

## 2021-03-09 MED ORDER — MACITENTAN 10 MG PO TABS
10.0000 mg | ORAL_TABLET | Freq: Every day | ORAL | Status: DC
  Administered 2021-03-10: 10 mg via ORAL
  Filled 2021-03-09: qty 1

## 2021-03-09 MED ORDER — LACTATED RINGERS IV SOLN: INTRAVENOUS | Status: DC

## 2021-03-09 MED ORDER — CEPHALEXIN 250 MG PO CAPS
250.0000 mg | ORAL_CAPSULE | Freq: Two times a day (BID) | ORAL | Status: DC
  Administered 2021-03-09 – 2021-03-10 (×2): 250 mg via ORAL
  Filled 2021-03-09 (×3): qty 1

## 2021-03-09 MED ORDER — MONTELUKAST SODIUM 10 MG PO TABS
10.0000 mg | ORAL_TABLET | Freq: Every day | ORAL | Status: DC
  Administered 2021-03-09: 10 mg via ORAL
  Filled 2021-03-09: qty 1

## 2021-03-09 MED ORDER — TORSEMIDE 20 MG PO TABS
40.0000 mg | ORAL_TABLET | Freq: Every day | ORAL | Status: DC
  Administered 2021-03-09: 40 mg via ORAL
  Filled 2021-03-09 (×2): qty 2

## 2021-03-09 MED ORDER — ROCURONIUM BROMIDE 100 MG/10ML IV SOLN
INTRAVENOUS | Status: DC | PRN
  Administered 2021-03-09: 40 mg via INTRAVENOUS

## 2021-03-09 MED ORDER — PHENYLEPHRINE HCL (PRESSORS) 10 MG/ML IV SOLN
INTRAVENOUS | Status: DC | PRN
  Administered 2021-03-09: 40 ug via INTRAVENOUS
  Administered 2021-03-09: 80 ug via INTRAVENOUS

## 2021-03-09 MED ORDER — CEFAZOLIN SODIUM-DEXTROSE 2-4 GM/100ML-% IV SOLN
2.0000 g | INTRAVENOUS | Status: AC
  Administered 2021-03-09: 2 g via INTRAVENOUS
  Filled 2021-03-09: qty 100

## 2021-03-09 MED ORDER — LORATADINE 10 MG PO TABS: 10.0000 mg | ORAL_TABLET | Freq: Every day | ORAL | Status: DC | PRN

## 2021-03-09 MED ORDER — MIDODRINE HCL 5 MG PO TABS
15.0000 mg | ORAL_TABLET | Freq: Three times a day (TID) | ORAL | Status: DC
  Administered 2021-03-09 – 2021-03-10 (×2): 15 mg via ORAL
  Filled 2021-03-09 (×2): qty 3

## 2021-03-09 MED ORDER — CHLORHEXIDINE GLUCONATE 0.12 % MT SOLN
15.0000 mL | Freq: Once | OROMUCOSAL | Status: AC
  Administered 2021-03-09: 15 mL via OROMUCOSAL

## 2021-03-09 MED ORDER — FENTANYL CITRATE (PF) 100 MCG/2ML IJ SOLN: 25.0000 ug | INTRAMUSCULAR | Status: DC | PRN

## 2021-03-09 MED ORDER — SENNA 8.6 MG PO TABS
1.0000 | ORAL_TABLET | Freq: Two times a day (BID) | ORAL | Status: DC
  Administered 2021-03-09 – 2021-03-10 (×2): 8.6 mg via ORAL
  Filled 2021-03-09 (×3): qty 1

## 2021-03-09 MED ORDER — FENTANYL CITRATE (PF) 100 MCG/2ML IJ SOLN
INTRAMUSCULAR | Status: DC | PRN
  Administered 2021-03-09: 100 ug via INTRAVENOUS
  Administered 2021-03-09: 50 ug via INTRAVENOUS

## 2021-03-09 MED ORDER — ETOMIDATE 2 MG/ML IV SOLN
INTRAVENOUS | Status: AC
  Filled 2021-03-09: qty 20

## 2021-03-09 MED ORDER — ORAL CARE MOUTH RINSE: 15.0000 mL | Freq: Once | OROMUCOSAL | Status: AC

## 2021-03-09 MED ORDER — LIDOCAINE HCL (CARDIAC) PF 100 MG/5ML IV SOSY
PREFILLED_SYRINGE | INTRAVENOUS | Status: DC | PRN
  Administered 2021-03-09: 60 mg via INTRATRACHEAL

## 2021-03-09 MED ORDER — PROMETHAZINE HCL 25 MG/ML IJ SOLN: 6.2500 mg | INTRAMUSCULAR | Status: DC | PRN

## 2021-03-09 MED ORDER — SIMVASTATIN 20 MG PO TABS
10.0000 mg | ORAL_TABLET | Freq: Every day | ORAL | Status: DC
  Administered 2021-03-09: 10 mg via ORAL
  Filled 2021-03-09: qty 1

## 2021-03-09 MED ORDER — POTASSIUM CHLORIDE CRYS ER 20 MEQ PO TBCR
20.0000 meq | EXTENDED_RELEASE_TABLET | Freq: Every day | ORAL | Status: DC
  Administered 2021-03-09 – 2021-03-10 (×2): 20 meq via ORAL
  Filled 2021-03-09 (×2): qty 1

## 2021-03-09 MED ORDER — IPRATROPIUM-ALBUTEROL 0.5-2.5 (3) MG/3ML IN SOLN: 3.0000 mL | Freq: Four times a day (QID) | RESPIRATORY_TRACT | Status: DC | PRN

## 2021-03-09 MED ORDER — MIDODRINE HCL 5 MG PO TABS: 5.0000 mg | ORAL_TABLET | Freq: Three times a day (TID) | ORAL | Status: DC

## 2021-03-09 MED ORDER — BELLADONNA ALKALOIDS-OPIUM 16.2-60 MG RE SUPP: 1.0000 | Freq: Four times a day (QID) | RECTAL | Status: DC | PRN

## 2021-03-09 MED ORDER — FENTANYL CITRATE (PF) 100 MCG/2ML IJ SOLN
INTRAMUSCULAR | Status: AC
  Filled 2021-03-09: qty 2

## 2021-03-09 MED ORDER — SILDENAFIL CITRATE 20 MG PO TABS
40.0000 mg | ORAL_TABLET | Freq: Three times a day (TID) | ORAL | Status: DC
  Administered 2021-03-09 – 2021-03-10 (×3): 40 mg via ORAL
  Filled 2021-03-09 (×5): qty 2

## 2021-03-09 MED ORDER — CHLORHEXIDINE GLUCONATE CLOTH 2 % EX PADS: 6.0000 | MEDICATED_PAD | Freq: Every day | CUTANEOUS | Status: DC

## 2021-03-09 MED ORDER — ACETAMINOPHEN 325 MG PO TABS: 650.0000 mg | ORAL_TABLET | ORAL | Status: DC | PRN

## 2021-03-09 MED ORDER — PANTOPRAZOLE SODIUM 20 MG PO TBEC
20.0000 mg | DELAYED_RELEASE_TABLET | Freq: Every day | ORAL | Status: DC
  Administered 2021-03-10: 20 mg via ORAL
  Filled 2021-03-09: qty 1

## 2021-03-09 MED ORDER — SODIUM CHLORIDE 0.45 % IV SOLN: INTRAVENOUS | Status: DC

## 2021-03-09 MED ORDER — MACITENTAN 10 MG PO TABS: 10.0000 mg | ORAL_TABLET | Freq: Every day | ORAL | Status: DC

## 2021-03-09 MED ORDER — PHENYLEPHRINE 40 MCG/ML (10ML) SYRINGE FOR IV PUSH (FOR BLOOD PRESSURE SUPPORT)
PREFILLED_SYRINGE | INTRAVENOUS | Status: AC
  Filled 2021-03-09: qty 10

## 2021-03-09 MED ORDER — HYDROCODONE-ACETAMINOPHEN 5-325 MG PO TABS: 1.0000 | ORAL_TABLET | ORAL | Status: DC | PRN

## 2021-03-09 MED ORDER — STERILE WATER FOR IRRIGATION IR SOLN
Status: DC | PRN
  Administered 2021-03-09: 9000 mL

## 2021-03-09 MED ORDER — LEVOTHYROXINE SODIUM 100 MCG PO TABS
100.0000 ug | ORAL_TABLET | Freq: Every day | ORAL | Status: DC
  Administered 2021-03-10: 100 ug via ORAL
  Filled 2021-03-09 (×2): qty 1

## 2021-03-09 SURGICAL SUPPLY — 22 items
BAG DRN RND TRDRP ANRFLXCHMBR (UROLOGICAL SUPPLIES)
BAG URINE DRAIN 2000ML AR STRL (UROLOGICAL SUPPLIES) IMPLANT
BAG URO CATCHER STRL LF (MISCELLANEOUS) ×2 IMPLANT
CATH FOLEY 2WAY SLVR 30CC 24FR (CATHETERS) IMPLANT
CATH HEMA 3WAY 30CC 22FR COUDE (CATHETERS) ×2 IMPLANT
DRAPE FOOT SWITCH (DRAPES) ×2 IMPLANT
ELECT REM PT RETURN 15FT ADLT (MISCELLANEOUS) ×2 IMPLANT
EVACUATOR MICROVAS BLADDER (UROLOGICAL SUPPLIES) IMPLANT
GLOVE BIOGEL M 8.0 STRL (GLOVE) ×2 IMPLANT
GOWN STRL REUS W/TWL XL LVL3 (GOWN DISPOSABLE) ×2 IMPLANT
KIT TURNOVER KIT A (KITS) ×2 IMPLANT
LOOP MONOPOLAR YLW (ELECTROSURGICAL) ×2 IMPLANT
MANIFOLD NEPTUNE II (INSTRUMENTS) ×2 IMPLANT
NDL SAFETY ECLIPSE 18X1.5 (NEEDLE) ×1 IMPLANT
NEEDLE HYPO 18GX1.5 SHARP (NEEDLE) ×2
PACK CYSTO (CUSTOM PROCEDURE TRAY) ×2 IMPLANT
PENCIL SMOKE EVACUATOR (MISCELLANEOUS) IMPLANT
SYR TOOMEY IRRIG 70ML (MISCELLANEOUS)
SYRINGE TOOMEY IRRIG 70ML (MISCELLANEOUS) IMPLANT
TUBING CONNECTING 10 (TUBING) ×2 IMPLANT
TUBING UROLOGY SET (TUBING) ×2 IMPLANT
WATER STERILE IRR 3000ML UROMA (IV SOLUTION) ×2 IMPLANT

## 2021-03-09 NOTE — Transfer of Care (Signed)
Immediate Anesthesia Transfer of Care Note  Patient: Vincent Robinson  Procedure(s) Performed: TRANSURETHRAL RESECTION OF BLADDER TUMOR (N/A )  Patient Location: PACU  Anesthesia Type:General  Level of Consciousness: awake, alert , oriented and patient cooperative  Airway & Oxygen Therapy: Patient Spontanous Breathing and Patient connected to face mask oxygen  Post-op Assessment: Report given to RN, Post -op Vital signs reviewed and stable and Patient moving all extremities X 4  Post vital signs: Reviewed and stable  Last Vitals:  Vitals Value Taken Time  BP 98/55 03/09/21 1133  Temp    Pulse 40 03/09/21 1135  Resp 21 03/09/21 1135  SpO2 95 % 03/09/21 1135  Vitals shown include unvalidated device data.  Last Pain:  Vitals:   03/09/21 0906  TempSrc: Oral         Complications: No complications documented.

## 2021-03-09 NOTE — Progress Notes (Signed)
Patient's SBP in the low 90s. Urologist messaged via secure chat per orders to notify. NP Jiles Crocker made aware via page.  NP Noberto Retort called back and stated no new orders at this time   Charge nurse made aware.  RN will continue to monitor BP closely.   Patient is asymptomatic, resting with BIPAP on in no apparent distress.

## 2021-03-09 NOTE — Progress Notes (Signed)
Patient's SBP in the low 80s. Urologist messaged via secure chat per orders to notify. Will relay onto oncoming nurse to monitor BP closely. Patient is asymptomatic with no apparent distress.

## 2021-03-09 NOTE — Progress Notes (Signed)
Skin check completed. Patient has scattered bruising bilateral upper and lower extremities. Patient has a callus at the back of his left foot. Patient heels are blanchable. Blanchable sacrum. Patient encouraged to reposition frequently. No open wounds present. Patient declines any questions. Call bell in reach, bed alarm on. Will continue to monitor.

## 2021-03-09 NOTE — Interval H&P Note (Signed)
History and Physical Interval Note:  03/09/2021 10:29 AM  Vincent Robinson  has presented today for surgery, with the diagnosis of BLADDER CANCER.  The various methods of treatment have been discussed with the patient and family. After consideration of risks, benefits and other options for treatment, the patient has consented to  Procedure(s): TRANSURETHRAL RESECTION OF BLADDER TUMOR WITH GEMCITABINE (N/A) as a surgical intervention.  The patient's history has been reviewed, patient examined, no change in status, stable for surgery.  I have reviewed the patient's chart and labs.  Questions were answered to the patient's satisfaction.     Lillette Boxer [name]

## 2021-03-09 NOTE — Progress Notes (Signed)
Pharmacy Consult for Pulmonary Hypertension Treatment   Indication - Continuation of prior to admission medication   Patient is 87 y.o.  with history of PAH on chronic Macitentan (Opsumit) PTA and will be continued while hospitalized.   Continuing this medication order as an inpatient requires that monitoring parameters per REMS requirements must be met.  1. Chronic therapy is under the supervision of Dr Haroldine Laws (MD name) who is enrolled in the REMS program and is being notified of continuation of therapy. A staff message in EPIC has been sent notifying the certified prescriber.  2. Per patient report has previously been educated on Hepatotoxicity . On admission pregnancy risk has been assessed and no monitoring required.   3. Hepatic function has been evaluated. AST/ALT appropriate to continue medication at this time.  Hepatic Function Latest Ref Rng & Units 11/18/2020 11/17/2020 11/16/2020  Total Protein 6.5 - 8.1 g/dL 5.8(L) 6.2(L) 6.3(L)  Albumin 3.5 - 5.0 g/dL 2.4(L) 2.4(L) 2.5(L)  AST 15 - 41 U/L 30 32 22  ALT 0 - 44 U/L 33 28 16  Alk Phosphatase 38 - 126 U/L 49 84 59  Total Bilirubin 0.3 - 1.2 mg/dL 0.8 1.2 0.9  Bilirubin, Direct 0.0 - 0.2 mg/dL - - -    If any question arise or pregnancy is identified during hospitalization, contact for bosentan and macitentan: [phone]; ambrisentan: [phone].  Thank for you allowing [place] to participate in the care of this patient.  Gretta Arab PharmD, BCPS Clinical Pharmacist [facility] main pharmacy [phone] 03/09/2021 4:18 PM

## 2021-03-09 NOTE — Op Note (Signed)
Preoperative diagnosis: 4 cm right bladder wall tumor  Postoperative diagnosis: Same  Principal procedure: Cystoscopy, transurethral resection of 4 cm bladder tumor  Surgeon: Brittian Renaldo  Anesthesia: General endotracheal  Complications: None  Specimen: Bladder tumor, for permanent specimen  Estimated blood loss: Less than 50 mL  Drains: 36 French three-way hematuria catheter    Indications: 87 year old male with significant medical issues, including cardiac history who has had recurrent gross hematuria for quite some time from unknown bladder tumor on his right bladder wall.  Because of his age and medical conditions, we have been holding off on any definitive management.  However, with his persistent bleeding, which is getting worse with time, we have decided to proceed with resection.  I discussed the procedure with the patient as well as the high risk nature of the anesthetic for this procedure.  He has been cleared by Dr. Nani Ravens for proceeding.  Findings: Prostate was mildly obstructive from bilobar hypertrophy.  Bladder was significantly trabeculated.  Blood clot was present posteriorly.  Papillary lesion on the right bladder wall, 4 cm in diameter.  No other bladder lesions were noted.  Description of procedure: The patient was properly identified in the holding area.  He is taken to the operating room where general endotracheal anesthetic was administered.  He was placed in the dorsolithotomy position.  Genitalia and perineum were prepped, draped, proper timeout performed.  81 French resectoscope sheath was placed with the obturator.  The resectoscope and cutting loop were placed.  Using monopolar device, the tumor was resected down to the muscular layer using cutting current.  The entire tumor was removed in this manner.  Careful coagulation was performed to achieve hemostasis.  Small fragments in the bladder clot were irrigated from the bladder with a [place] syringe and sent for  permanent specimen.  Careful reinspection of the entire resection site revealed adequate hemostasis.  At this point, the scope was removed.  A 22 French three-way Foley catheter was then placed, with the irrigation port plugged.  Balloon filled with 30 cc of saline.  The procedure was then terminated.  The patient was awakened, extubated and then taken to the PACU in stable condition.  He tolerated the procedure well.

## 2021-03-09 NOTE — Plan of Care (Signed)
  Problem: Health Behavior/Discharge Planning: Goal: Ability to manage health-related needs will improve Outcome: Progressing   Problem: Clinical Measurements: Goal: Will remain free from infection Outcome: Progressing   Problem: Activity: Goal: Risk for activity intolerance will decrease Outcome: Progressing   Problem: Nutrition: Goal: Adequate nutrition will be maintained Outcome: Progressing   Problem: Safety: Goal: Ability to remain free from injury will improve Outcome: Progressing   Problem: Skin Integrity: Goal: Risk for impaired skin integrity will decrease Outcome: Progressing   

## 2021-03-09 NOTE — Anesthesia Procedure Notes (Signed)
Procedure Name: Intubation Date/Time: 03/09/2021 10:48 AM Performed by: Jonna Munro, CRNA Pre-anesthesia Checklist: Patient identified, Emergency Drugs available, Suction available, Patient being monitored and Timeout performed Patient Re-evaluated:Patient Re-evaluated prior to induction Oxygen Delivery Method: Circle system utilized Preoxygenation: Pre-oxygenation with 100% oxygen Induction Type: IV induction Ventilation: Mask ventilation without difficulty Laryngoscope Size: Mac and 3 Grade View: Grade I Tube type: Oral Tube size: 7.0 mm Number of attempts: 1 Airway Equipment and Method: Stylet Placement Confirmation: positive ETCO2,  ETT inserted through vocal cords under direct vision and breath sounds checked- equal and bilateral Secured at: 23 cm Tube secured with: Tape Dental Injury: Teeth and Oropharynx as per pre-operative assessment

## 2021-03-10 ENCOUNTER — Encounter (HOSPITAL_COMMUNITY): Payer: Self-pay | Admitting: Urology

## 2021-03-10 DIAGNOSIS — C679 Malignant neoplasm of bladder, unspecified: Secondary | ICD-10-CM | POA: Diagnosis not present

## 2021-03-10 LAB — SURGICAL PATHOLOGY

## 2021-03-10 MED ORDER — CEPHALEXIN 250 MG PO CAPS: 250.0000 mg | ORAL_CAPSULE | Freq: Two times a day (BID) | ORAL | 0 refills | Status: DC

## 2021-03-10 NOTE — TOC Initial Note (Signed)
Transition of Care [facility]) - Initial/Assessment Note    Patient Details  Name: Vincent Robinson MRN: [id number] Date of Birth: 1923/06/28  Transition of Care [facility]) CM/SW Contact:    Dessa Phi, RN Phone Number: 03/10/2021, 9:02 AM  Clinical Narrative:  Damaris Schooner to dtr [name] about d/c plans-PTA Active w/[facility] HHRN-flush PCN, dsg changes;s/p TURP-Sharon would like [facility] nsg to continue with assessments,checking v/s-spoke to [facility] nurse Mickel Baas to inform attending if [facility] nsg would be appropriate to order-await order if needed, also informed [name] dtr that she could also get order from PCP if not ordered at d/c if she preferred.Lenn Cal aware. Family has own transport home.                  Expected Discharge Plan: [place] Barriers to Discharge: No Barriers Identified   Patient Goals and CMS Choice Patient states their goals for this hospitalization and ongoing recovery are:: go home CMS Medicare.gov Compare Post Acute Care list provided to:: Patient Represenative (must comment) [name] dtr [phone]) Choice offered to / list presented to : Adult Children  Expected Discharge Plan and Services Expected Discharge Plan: [place]   Discharge Planning Services: CM Consult   Living arrangements for the past 2 months: Single Family Home Expected Discharge Date: 03/10/21                                    Prior Living Arrangements/Services Living arrangements for the past 2 months: Single Family Home Lives with:: Self Patient language and need for interpreter reviewed:: Yes Do you feel safe going back to the place where you live?: Yes      Need for Family Participation in Patient Care: No (Comment) Care giver support system in place?: Yes (comment) Current home services: Home RN [facility] nsg) Criminal Activity/Legal Involvement Pertinent to Current Situation/Hospitalization: No - Comment as needed  Activities of Daily  Living Home Assistive Devices/Equipment: Cane (specify quad or straight),Dentures (specify type),BIPAP,Hearing aid,Walker (specify type) (full upper and lower) ADL Screening (condition at time of admission) Patient's cognitive ability adequate to safely complete daily activities?: Yes Is the patient deaf or have difficulty hearing?: Yes Does the patient have difficulty seeing, even when wearing glasses/contacts?: No Does the patient have difficulty concentrating, remembering, or making decisions?: No Patient able to express need for assistance with ADLs?: Yes Does the patient have difficulty dressing or bathing?: No Independently performs ADLs?: Yes (appropriate for developmental age) Does the patient have difficulty walking or climbing stairs?: Yes Weakness of Legs: Both Weakness of Arms/Hands: None  Permission Sought/Granted Permission sought to share information with : Case Manager Permission granted to share information with : Yes, Verbal Permission Granted  Share Information with NAME: Case manager     Permission granted to share info w Relationship: [name] dtr [phone]     Emotional Assessment Appearance:: Appears stated age Attitude/Demeanor/Rapport: Gracious Affect (typically observed): Accepting Orientation: : Oriented to Self,Oriented to Place,Oriented to  Time Alcohol / Substance Use: Not Applicable Psych Involvement: No (comment)  Admission diagnosis:  Malignant neoplasm of overlapping sites of bladder [facility]) [C67.8] Patient Active Problem List   Diagnosis Date Noted  . Malignant neoplasm of overlapping sites of bladder ([place]) 03/09/2021  . Pneumonia due to COVID-19 virus 11/14/2020  . Elevated troponin 11/14/2020  . Bladder mass 11/14/2020  . Acute cholecystitis 09/08/2020  .  Diverticular hemorrhage   . Rectal bleeding 03/22/2020  . Acute GI bleeding 03/22/2020  . Hypokalemia   . Hypotension   . Acute respiratory failure with hypoxia ([place]) 12/18/2019  .  Acute on chronic right-sided heart failure ([place]) 12/18/2019  . Pressure injury of skin 11/24/2019  . Chronic respiratory failure ([place])   . Acute on chronic respiratory failure ([place]) 11/23/2019  . Right heart failure ([place])   . S/P CABG (coronary artery bypass graft) 11/22/2019  . Chest pain 11/22/2019  . Atrial fibrillation ([place]) 08/04/2019  . Pulmonary hypertension, unspecified ([place]) 10/11/2017  . Aortic stenosis, moderate 10/11/2017  . Coronary artery disease 08/30/2017  . AAA (abdominal aortic aneurysm) without rupture ([place]) 08/30/2017  . Acute blood loss anemia 06/07/2017  . Hemorrhagic shock ([place]) 06/07/2017  . Lower GI bleed 06/05/2017  . Sleep apnea 06/05/2017  . Hypothyroidism 06/05/2017  . Hyperlipidemia 06/05/2017  . Asthma 06/05/2017  . ILD (interstitial lung disease) ([place]) 05/28/2017  . Pneumothorax on right 05/20/2017  . COPD (chronic obstructive pulmonary disease) ([place]) 05/20/2017  . Pneumothorax 05/20/2017  . Skin cancer 10/23/2015   PCP:  Katherina Mires, MD Pharmacy:   [facility] at [place], [place] [address] Phone: [phone] Fax: [phone]     Social Determinants of Health (SDOH) Interventions    Readmission Risk Interventions Readmission Risk Prevention Plan 09/19/2020 03/25/2020 11/24/2019  Transportation Screening Complete Complete Complete  PCP or Specialist Appt within 5-7 Days - - Patient refused  Home Care Screening - - Complete  Medication Review (RN CM) - - Referral to Pharmacy  [place] or Home Care Consult Complete Patient refused -  Social Work Consult for [place] Planning/Counseling Complete - -  [place] Screening Complete Not Applicable -  Medication Review (RN Care Manager) Complete Complete -  Some recent data might be hidden

## 2021-03-10 NOTE — Plan of Care (Signed)
  Problem: Health Behavior/Discharge Planning: Goal: Ability to manage health-related needs will improve Outcome: Adequate for Discharge   Problem: Clinical Measurements: Goal: Will remain free from infection Outcome: Adequate for Discharge   Problem: Activity: Goal: Risk for activity intolerance will decrease Outcome: Adequate for Discharge   Problem: Nutrition: Goal: Adequate nutrition will be maintained Outcome: Adequate for Discharge   Problem: Safety: Goal: Ability to remain free from injury will improve Outcome: Adequate for Discharge   Problem: Skin Integrity: Goal: Risk for impaired skin integrity will decrease Outcome: Adequate for Discharge

## 2021-03-10 NOTE — TOC Transition Note (Addendum)
Transition of Care [facility]) - CM/SW Discharge Note   Patient Details  Name: JAHAAN VANWAGNER MRN: [id number] Date of Birth: 1923/09/28  Transition of Care [facility]) CM/SW Contact:  Dessa Phi, RN Phone Number: 03/10/2021, 10:04 AM   Clinical Narrative:d/c home w/[facility] HHRN-assessments, v/s. No further CM needs.       Final next level of care: [place] Barriers to Discharge: No Barriers Identified   Patient Goals and CMS Choice Patient states their goals for this hospitalization and ongoing recovery are:: go home CMS Medicare.gov Compare Post Acute Care list provided to:: Patient Represenative (must comment) [name] dtr [phone]) Choice offered to / list presented to : Adult Children  Discharge Placement                       Discharge Plan and Services   Discharge Planning Services: CM Consult                      HH Arranged: RN [facility] Agency: [place] Date [facility] Agency Contacted: 03/10/21 Time [place]: 1003 Representative spoke with at [place]: [place] ([place]) Interventions     Readmission Risk Interventions Readmission Risk Prevention Plan 09/19/2020 03/25/2020 11/24/2019  Transportation Screening Complete Complete Complete  PCP or Specialist Appt within 5-7 Days - - Patient refused  Home Care Screening - - Complete  Medication Review (RN CM) - - Referral to Pharmacy  [place] or Home Care Consult Complete Patient refused -  Social Work Consult for [place] Planning/Counseling Complete - -  [place] Screening Complete Not Applicable -  Medication Review Press photographer) Complete Complete -  Some recent data might be hidden

## 2021-03-10 NOTE — Progress Notes (Signed)
Discharge instructions reviewed with patient and daughter, [name], at bedside. No questions or concerns. Patient does not appear to be in any distress, patient declines any pain. Patient accompanied via wheelchair downstairs by NA.

## 2021-03-10 NOTE — Care Management Obs Status (Signed)
[place] NOTIFICATION   Patient Details  Name: OLUWADAMILARE TOBLER MRN: [id number] Date of Birth: 1923/07/20   Medicare Observation Status Notification Given:  Yes    MahabirJuliann Pulse, RN 03/10/2021, 9:13 AM

## 2021-03-10 NOTE — Discharge Instructions (Signed)

## 2021-03-21 NOTE — Anesthesia Postprocedure Evaluation (Signed)
Anesthesia Post Note  Patient: Vincent Robinson  Procedure(s) Performed: TRANSURETHRAL RESECTION OF BLADDER TUMOR (N/A )     Patient location during evaluation: PACU Anesthesia Type: General Level of consciousness: sedated and patient cooperative Pain management: pain level controlled Vital Signs Assessment: post-procedure vital signs reviewed and stable Respiratory status: spontaneous breathing Cardiovascular status: stable Anesthetic complications: no   No complications documented.  Last Vitals:  Vitals:   03/09/21 2341 03/10/21 0445  BP:  (!) 90/50  Pulse: 75 88  Resp: 20 15  Temp:  36.7 C  SpO2: 92% 95%    Last Pain:  Vitals:   03/10/21 0913  TempSrc:   PainSc: 0-No pain                 Nolon Nations

## 2021-03-23 NOTE — Discharge Summary (Signed)
Patient ID: Vincent Robinson MRN: [id number] DOB/AGE: Dec 02, 1922 87 y.o.  Admit date: 03/09/2021 Discharge date: 5.20.2022 Primary Care Physician:  Katherina Mires, MD  Discharge Diagnoses:  Bladder cancer Present on Admission: . Malignant neoplasm of overlapping sites of bladder [facility])   Consults:  None   Discharge Medications: Allergies as of 03/10/2021   No Known Allergies     Medication List    TAKE these medications   cephALEXin 250 MG capsule Commonly known as: KEFLEX Take 1 capsule (250 mg total) by mouth every 12 (twelve) hours.   fluticasone 50 MCG/ACT nasal spray Commonly known as: FLONASE Place 1 spray into both nostrils daily as needed for allergies or rhinitis.   Fluticasone-Salmeterol(sensor) 113-14 MCG/ACT Aepb Inhale 1 puff into the lungs 2 (two) times daily.   ipratropium-albuterol 0.5-2.5 (3) MG/3ML Soln Commonly known as: DUONEB Take 3 mLs by nebulization every 6 (six) hours as needed (shortness of breath or wheezing).   levothyroxine 100 MCG tablet Commonly known as: SYNTHROID Take 1 tablet (100 mcg total) by mouth daily at 6 (six) AM.   loratadine 10 MG tablet Commonly known as: CLARITIN Take 10 mg by mouth daily as needed for allergies.   midodrine 10 MG tablet Commonly known as: PROAMATINE Take 1 tablet (10 mg total) by mouth 3 (three) times daily after meals. In addition with the 5mg  tablets   midodrine 5 MG tablet Commonly known as: PROAMATINE Take 1 tablet (5 mg total) by mouth 3 (three) times daily with meals. In addition with the 10mg  tablets   montelukast 10 MG tablet Commonly known as: SINGULAIR Take 10 mg by mouth at bedtime.   Normal Saline Flush 0.9 % Soln USE 5 MLS 2 TIMES DAILY AS DIRECTED   Opsumit 10 MG tablet Generic drug: macitentan Take 1 tablet (10 mg total) by mouth daily. Needs appt for future refills   pantoprazole 20 MG tablet Commonly known as: PROTONIX Take 20 mg by mouth daily.   potassium chloride SA 20 MEQ  tablet Commonly known as: KLOR-CON Take 20 mEq by mouth in the morning and at bedtime.   sildenafil 20 MG tablet Commonly known as: REVATIO TAKE TWO TABLETS BY MOUTH THREE TIMES A DAY What changed: See the new instructions.   simvastatin 10 MG tablet Commonly known as: ZOCOR Take 10 mg by mouth at bedtime.   torsemide 20 MG tablet Commonly known as: DEMADEX TAKE TWO TABLETS BY MOUTH DAILY        Significant Diagnostic Studies:  No results found.  Brief H and P: For complete details please refer to admission H and P, but in brief pt admitted to undergo TURBT   Hospital Course: Pt underwent TURBT of 4 cm Rt bladder tumor. No postop issues but he was kept on progresive care unit d/t cardiac status. D/C on pod 1.   Day of Discharge BP (!) 90/50 (BP Location: Right Arm)   Pulse 88   Temp 98.1 F (36.7 C) (Oral)   Resp 15   SpO2 95%   No results found for this or any previous visit (from the past 24 hour(s)).  Physical Exam: General: Alert and awake oriented x3 not in any acute distress. HEENT: anicteric sclera, pupils reactive to light and accommodation CVS: S1-S2 clear no murmur rubs or gallops Chest: clear to auscultation bilaterally, no wheezing rales or rhonchi Abdomen: soft nontender, nondistended, normal bowel sounds, no organomegaly Extremities: no cyanosis, clubbing or edema noted bilaterally Neuro: Cranial nerves II-XII intact, no  focal neurological deficits  Disposition:  Home  Diet:  Regular  Activity:  No restrictions   TESTS THAT NEED FOLLOW-UP   path review  DISCHARGE FOLLOW-UP   Follow-up Information    Franchot Gallo, MD Follow up.   Specialty: Urology Why: 6.8.2022 @ 3:45 Contact information: [place] [address] [phone]        Care, [facility] Follow up.   Specialty: [place] Why: [facility] physical therapy Contact information: [place]  [address] [phone]               Time spent on Discharge:   15 mins  Signed: Lillette Boxer Aniruddh Ciavarella 03/23/2021, 7:38 AM

## 2021-03-24 ENCOUNTER — Telehealth: Payer: Self-pay | Admitting: Pulmonary Disease

## 2021-03-24 NOTE — Telephone Encounter (Signed)
Left message for Beth to call back.

## 2021-03-24 NOTE — Telephone Encounter (Signed)
[facility] phone number is [phone].

## 2021-03-27 MED ORDER — PREDNISONE 20 MG PO TABS: 40.0000 mg | ORAL_TABLET | Freq: Every day | ORAL | 0 refills | Status: DC

## 2021-03-27 NOTE — Telephone Encounter (Signed)
Advise he get tested for COVID. Call in prednisone 40 mg/day for 5 days

## 2021-03-27 NOTE — Telephone Encounter (Signed)
Beth with [facility] returning a phone call. Beth can be reached at [phone].

## 2021-03-27 NOTE — Telephone Encounter (Signed)
Primary Pulmonologist: Dr. Vaughan Browner Last office visit and with whom: 11/16/2019 with Dr. Vaughan Browner What do we see them for (pulmonary problems): ILD Last OV assessment/plan: see below  Was appointment offered to patient (explain)?    Assessment:  COPD, asthmatic bronchitis. Seen for exacerbation today. Continue airduo.  He is using it only once daily.  Will increase to twice daily Treat with Z-Pak, prednisone for 5 days Chest x-ray Continue supplemental oxygen.  Mild ILD, postinflammatory lung fibrosis Previous CTs have apparently shown a calcified RUL scarring and mild ILD. The repeat high res resolution CT shows stability of findings from 2016 - 2019. I believe this may be from prior infections and exposures during his time in [place] and during world war.  He will not need to repeat CTs.  This can be followed up on chest x-rays.  OSA Continues on BiPAP with good compliance.  Heart failure, pulmonary hypertension Follow with cardiology and advanced heart failure service  On sildenafil, opsumit  Health maintenance 08/05/2017- Prevnar 13 10/22/2002- Pneumovax  Plan/Recommendations: - Continue airduo, O2, bipap - CXR, Z pack, prednisone  Marshell Garfinkel MD [facility] 11/16/2019, 10:56 AM          Reason for call: Beth nurse from [place] called and stated that for 3 days patient has been complaining of more SOB and coughing up clear mucous. No chest pain, fever, chills, N/V. Taking inhalers as prescribed. Requesting recs. Routing to Dr. Vaughan Browner  Dr. Vaughan Browner, please advise    (examples of things to ask: : When did symptoms start? Fever? Cough? Productive? Color to sputum? More sputum than usual? Wheezing? Have you needed increased oxygen? Are you taking your respiratory medications? What over the counter measures have you tried?)  No Known Allergies  Immunization History  Administered Date(s) Administered  . Influenza, High Dose Seasonal PF  07/27/2016, 08/05/2017, 08/10/2019  . Pneumococcal Conjugate-13 08/05/2017  . Pneumococcal Polysaccharide-23 10/22/2002, 08/08/2018  . Pneumococcal-Unspecified 10/22/2002

## 2021-03-27 NOTE — Telephone Encounter (Signed)
Tried Engineer, manufacturing and had to [employer] pt and went over recs  He verbalized understanding  Rx for pred sent to pharm  He will call if he tests pos or symptoms do not improve

## 2021-04-03 ENCOUNTER — Other Ambulatory Visit: Payer: Self-pay

## 2021-04-03 ENCOUNTER — Other Ambulatory Visit: Payer: Medicare Other | Admitting: Student

## 2021-04-03 DIAGNOSIS — R0602 Shortness of breath: Secondary | ICD-10-CM

## 2021-04-03 DIAGNOSIS — R059 Cough, unspecified: Secondary | ICD-10-CM

## 2021-04-03 DIAGNOSIS — R319 Hematuria, unspecified: Secondary | ICD-10-CM

## 2021-04-03 DIAGNOSIS — Z515 Encounter for palliative care: Secondary | ICD-10-CM

## 2021-04-03 DIAGNOSIS — R600 Localized edema: Secondary | ICD-10-CM

## 2021-04-03 NOTE — Progress Notes (Signed)
[employer]er[employer] Palliative Care Consult Note Telephone: [phone]  Fax: [phone]    Date of encounter: 04/03/21 PATIENT NAME: Vincent Robinson [address] [place] [address]   [phone] (home)  DOB: 20-Feb-1923 MRN: [id number] PRIMARY CARE PROVIDER:    Katherina Mires, MD,  [place] [address] [phone]  REFERRING PROVIDER:   Katherina Mires, MD [place],  [place] [address] [phone]  RESPONSIBLE PARTY:    Contact Information     Name Relation Home Work [place] Daughter [phone]  [phone]   Mikeal, Winstanley [phone]     Higginbotham,Debra Relative [phone]  [phone]        I met face to face with patient and family in the home. Palliative Care was asked to follow this patient by consultation request of  Katherina Mires, MD to address advance care planning and complex medical decision making. This is a follow up visit.                                   ASSESSMENT AND PLAN / RECOMMENDATIONS:   Advance Care Planning/Goals of Care: Goals include to maximize quality of life and symptom management. Our advance care planning conversation included a discussion about:    The value and importance of advance care planning  Experiences with loved ones who have been seriously ill or have died  Exploration of personal, cultural or spiritual beliefs that might influence medical decisions  Exploration of goals of care in the event of a sudden injury or illness   Review and updating or creation of an  advance directive document  CODE STATUS: Full Code  Symptom Management/Plan:  Dyspnea-secondary to COPD, heart failure. Continue oxygen at 3 lpm, continue torsemide and inhalers as directed.   Cough-secondary to COPD. Patient encouraged to take prednisone x 5 days course as ordered.    Edema secondary to heart failure-continue  torsemide as directed, compression stockings daily, elevate legs when resting or in bed. Continue daily weights. Monitor sodium intake. Continue macitentan and sildenafil for his pulmonary hypertension. Follow up with Cardiology as scheduled.   Hematuria-patient s/p TURBT on 03/09/21. Hematuria has improved. Follow up with urology as scheduled.   Biliary drain-secondary to cholecystitis. Not a surgical candidate. Drain to be changed out every 6 weeks. [facility] nurse referral placed per PCP to help maintain, flush.   Patient is considering moving to an AL. He is awaiting to see what is going on with his wife who resides at [facility]; wife has cancer and upcoming scan.      Follow up Palliative Care Visit: Palliative care will continue to follow for complex medical decision making, advance care planning, and clarification of goals. Return in 8 weeks or prn.  I spent 60 minutes providing this consultation. More than 50% of the time in this consultation was spent in counseling and care coordination.   PPS: 60%  HOSPICE ELIGIBILITY/DIAGNOSIS: TBD  Chief Complaint: Palliative Medicine follow up; shortness of breath.  HISTORY OF PRESENT ILLNESS:  DASHIEL Robinson is a 87 y.o. year old male  with right sided congestive heart failure, COPD, CAD s/p CABG, pulmonary hypertension, moderate aortic stenosis, atrial fibrillation-not a candidate for oral anticoagulation d/t increased bleeding risk. Dx also include anemia, bladder mass, biliary drain d/t cholecystitis. Patient status post TURBT on 03/09/21.  Returned  from vacation in [place]. Home health for biliary drain. HH SN coming once a week. No change in drainage. Patient was prescribed prednisone 20 mg 2 tabs daily x 5 days lat week due to cough; he has not started medication. Daughter [name] reports patient does not always take inhalers. He is weighing himself most days; occasional swelling to BLE. Wears compression hose. Endorses fair sleep due to getting up to  use bathroom. Denies trouble falling asleep. Naps during the day. No recent falls/injury. Ha discussed AL depending on his wife's condition. No fever or chills. No hematuria since TURBT. Saw urology last week for f/u visit.   History obtained from review of EMR, discussion with primary team, and interview with family, facility staff/caregiver and/or Mr. Guandique.  I reviewed available labs, medications, imaging, studies and related documents from the EMR.  Records reviewed and summarized above.   ROS  General: NAD EYES: denies vision changes ENMT: denies dysphagia Cardiovascular: denies chest pain Pulmonary: productive cough, SOB with exertion Abdomen: endorses fair appetite, denies constipation, endorses continence of bowel GU: denies dysuria MSK: weakness,  no falls reported Skin: denies rashes or wounds Neurological: denies pain Psych: Endorses positive mood Heme/lymph/immuno: denies bruises, abnormal bleeding  Physical Exam: Current and past weights: 148 pounds yesterday Pulse 68 apical/ irregular, resp 20, bp 100/58, sats 93% on 3lpm Constitutional: NAD General: frail appearing  EYES: anicteric sclera, lids intact, no discharge  ENMT: slight hard of hearing, oral mucous membranes moist CV: S1S2, RRR, 1+ LE edema Pulmonary: LCTA, no increased work of breathing, no cough, room air Abdomen:  normo-active BS + 4 quadrants, soft and non tender, biliary drain GU: deferred MSK: moves all extremities, ambulatory with rollator walker Skin: warm and dry, no rashes or wounds on visible skin Neuro: generalized weakness Psych: non-anxious affect, A and O x 3 Hem/lymph/immuno: no widespread bruising   Thank you for the opportunity to participate in the care of Mr. Schulke. 87  The palliative care team will continue to follow. Please call our office at [phone] if we can be of additional assistance.   Ezekiel Slocumb, NP   COVID-19 PATIENT SCREENING TOOL Asked and negative response  unless otherwise noted:   Have you had symptoms of covid, tested positive or been in contact with someone with symptoms/positive test in the past 5-10 days? No

## 2021-04-05 ENCOUNTER — Other Ambulatory Visit (HOSPITAL_COMMUNITY): Payer: Self-pay | Admitting: Interventional Radiology

## 2021-04-05 ENCOUNTER — Ambulatory Visit (HOSPITAL_COMMUNITY)
Admission: RE | Admit: 2021-04-05 | Discharge: 2021-04-05 | Disposition: A | Discharge status: Home / Self Care | Payer: Medicare Other | Source: Ambulatory Visit | Attending: Interventional Radiology | Admitting: Interventional Radiology

## 2021-04-05 ENCOUNTER — Other Ambulatory Visit: Payer: Self-pay

## 2021-04-05 DIAGNOSIS — Z434 Encounter for attention to other artificial openings of digestive tract: Secondary | ICD-10-CM | POA: Insufficient documentation

## 2021-04-05 DIAGNOSIS — K810 Acute cholecystitis: Secondary | ICD-10-CM | POA: Insufficient documentation

## 2021-04-05 DIAGNOSIS — K819 Cholecystitis, unspecified: Secondary | ICD-10-CM

## 2021-04-05 HISTORY — PX: IR EXCHANGE BILIARY DRAIN: IMG6046

## 2021-04-05 MED ORDER — LIDOCAINE HCL (PF) 1 % IJ SOLN
INTRAMUSCULAR | Status: DC | PRN
  Administered 2021-04-05: 5 mL

## 2021-04-05 MED ORDER — IOHEXOL 300 MG/ML  SOLN
50.0000 mL | Freq: Once | INTRAMUSCULAR | Status: AC | PRN
  Administered 2021-04-05: 5 mL

## 2021-04-05 MED ORDER — LIDOCAINE HCL (PF) 1 % IJ SOLN
INTRAMUSCULAR | Status: AC
  Filled 2021-04-05: qty 30

## 2021-04-05 NOTE — Procedures (Signed)
Interventional Radiology Procedure Note  Procedure: Cholecystostomy drain exchange  Indication: Biliary obstruction  Findings: Please refer to procedural dictation for full description.  Complications: None  EBL: < 10 mL  Miachel Roux, MD [phone]

## 2021-04-26 ENCOUNTER — Telehealth (HOSPITAL_COMMUNITY): Payer: Self-pay | Admitting: *Deleted

## 2021-04-26 ENCOUNTER — Encounter (HOSPITAL_BASED_OUTPATIENT_CLINIC_OR_DEPARTMENT_OTHER): Payer: Self-pay

## 2021-04-26 ENCOUNTER — Other Ambulatory Visit: Payer: Self-pay

## 2021-04-26 ENCOUNTER — Emergency Department (HOSPITAL_BASED_OUTPATIENT_CLINIC_OR_DEPARTMENT_OTHER)
Admission: EM | Admit: 2021-04-26 | Discharge: 2021-04-27 | Disposition: A | Discharge status: Home / Self Care | Payer: Medicare Other | Attending: Emergency Medicine | Admitting: Emergency Medicine

## 2021-04-26 DIAGNOSIS — I509 Heart failure, unspecified: Secondary | ICD-10-CM | POA: Diagnosis not present

## 2021-04-26 DIAGNOSIS — Z7951 Long term (current) use of inhaled steroids: Secondary | ICD-10-CM | POA: Diagnosis not present

## 2021-04-26 DIAGNOSIS — S3992XA Unspecified injury of lower back, initial encounter: Secondary | ICD-10-CM | POA: Diagnosis present

## 2021-04-26 DIAGNOSIS — I2510 Atherosclerotic heart disease of native coronary artery without angina pectoris: Secondary | ICD-10-CM | POA: Diagnosis not present

## 2021-04-26 DIAGNOSIS — Y9301 Activity, walking, marching and hiking: Secondary | ICD-10-CM | POA: Insufficient documentation

## 2021-04-26 DIAGNOSIS — S300XXA Contusion of lower back and pelvis, initial encounter: Secondary | ICD-10-CM | POA: Diagnosis not present

## 2021-04-26 DIAGNOSIS — E039 Hypothyroidism, unspecified: Secondary | ICD-10-CM | POA: Diagnosis not present

## 2021-04-26 DIAGNOSIS — Z955 Presence of coronary angioplasty implant and graft: Secondary | ICD-10-CM | POA: Insufficient documentation

## 2021-04-26 DIAGNOSIS — J45909 Unspecified asthma, uncomplicated: Secondary | ICD-10-CM | POA: Insufficient documentation

## 2021-04-26 DIAGNOSIS — J449 Chronic obstructive pulmonary disease, unspecified: Secondary | ICD-10-CM | POA: Insufficient documentation

## 2021-04-26 DIAGNOSIS — Z79899 Other long term (current) drug therapy: Secondary | ICD-10-CM | POA: Insufficient documentation

## 2021-04-26 DIAGNOSIS — Z8551 Personal history of malignant neoplasm of bladder: Secondary | ICD-10-CM | POA: Insufficient documentation

## 2021-04-26 DIAGNOSIS — Z87891 Personal history of nicotine dependence: Secondary | ICD-10-CM | POA: Insufficient documentation

## 2021-04-26 DIAGNOSIS — W01198A Fall on same level from slipping, tripping and stumbling with subsequent striking against other object, initial encounter: Secondary | ICD-10-CM | POA: Diagnosis not present

## 2021-04-26 DIAGNOSIS — Z85828 Personal history of other malignant neoplasm of skin: Secondary | ICD-10-CM | POA: Insufficient documentation

## 2021-04-26 DIAGNOSIS — Z8616 Personal history of COVID-19: Secondary | ICD-10-CM | POA: Insufficient documentation

## 2021-04-26 NOTE — Telephone Encounter (Signed)
Beth w/[facility] called to report pt has been more short of breath. Pt uses 3L of O2 on his inogen machine. O2 normally 94% at rest. Beth said the last few weeks his O2 has been 88-91% at rest. Pt has swelling in his legs but his weight is stable. No other complaints at this time. Pt is going to an independent living facility and next week will be pts last week with [facility].   Routed to [facility] for advice

## 2021-04-26 NOTE — ED Triage Notes (Signed)
Patient BIB [facility] from Home after a Mechanical Fall.  Patient was ambulating when patient slipped out of slippers and fell onto Floor. Patient complaining of Mid Back Pain in which patient fell onto Stool.  VSS with EMS. Patient Hypoxic at 88% on [facility] which is Baseline per Patient due to COPD. No Hx of Blood Thinners. No Head Trauma.  Ambulatory with Cane. GCS 15. A&Ox4.

## 2021-04-27 ENCOUNTER — Encounter (HOSPITAL_COMMUNITY): Payer: Self-pay | Admitting: *Deleted

## 2021-04-27 ENCOUNTER — Emergency Department (HOSPITAL_BASED_OUTPATIENT_CLINIC_OR_DEPARTMENT_OTHER): Payer: Medicare Other | Admitting: Radiology

## 2021-04-27 DIAGNOSIS — S300XXA Contusion of lower back and pelvis, initial encounter: Secondary | ICD-10-CM | POA: Diagnosis not present

## 2021-04-27 MED ORDER — HYDROCODONE-ACETAMINOPHEN 5-325 MG PO TABS
1.0000 | ORAL_TABLET | Freq: Once | ORAL | Status: AC
  Administered 2021-04-27: 1 via ORAL
  Filled 2021-04-27: qty 1

## 2021-04-27 MED ORDER — HYDROCODONE-ACETAMINOPHEN 5-325 MG PO TABS: 1.0000 | ORAL_TABLET | Freq: Four times a day (QID) | ORAL | 0 refills | Status: DC | PRN

## 2021-04-27 NOTE — ED Provider Notes (Signed)
[facility] EMERGENCY DEPT Provider Note   CSN: [id number] Arrival date & time: 04/26/21  2156     History Chief Complaint  Patient presents with   Vincent Robinson is a 87 y.o. male.  Patient presents to the emergency department for evaluation of back pain after a fall.  Patient reports that he was walking and slipped, fell backwards onto a stool.  He hit his lower back, at the waistline, on the edge of the stool.  He did not hit his head or lose consciousness.  No neck pain, headache.  Patient complaining of moderate to severe pain in the low back that worsens with movement.  Pain does not radiate to legs.  No hip pain.      Past Medical History:  Diagnosis Date   AAA (abdominal aortic aneurysm) ([facility])    Acute respiratory failure ([place]) 11/23/2019   Asthma    Basal cell carcinoma    CHF (congestive heart failure) (HCC)    Chronic respiratory failure (HCC)    COPD (chronic obstructive pulmonary disease) (HCC)    Coronary artery disease    Diverticulitis    Dysrhythmia    afib   History of chronic respiratory failure    Hyperlipidemia    Hypothyroidism    Moderate aortic stenosis    Myocardial infarction ([place])    Pneumonia    Pulmonary HTN ([place])    Right heart failure ([place])    Skin cancer 2017   bladder    Sleep apnea    Bipap    Patient Active Problem List   Diagnosis Date Noted   Malignant neoplasm of overlapping sites of bladder ([place]) 03/09/2021   Pneumonia due to COVID-19 virus 11/14/2020   Elevated troponin 11/14/2020   Bladder mass 11/14/2020   Acute cholecystitis 09/08/2020   Diverticular hemorrhage    Rectal bleeding 03/22/2020   Acute GI bleeding 03/22/2020   Hypokalemia    Hypotension    Acute respiratory failure with hypoxia ([place]) 12/18/2019   Acute on chronic right-sided heart failure ([place]) 12/18/2019   Pressure injury of skin 11/24/2019   Chronic respiratory failure ([place])    Acute on chronic respiratory failure ([place])  11/23/2019   Right heart failure (HCC)    S/P CABG (coronary artery bypass graft) 11/22/2019   Chest pain 11/22/2019   Atrial fibrillation ([place]) 08/04/2019   Pulmonary hypertension, unspecified ([place]) 10/11/2017   Aortic stenosis, moderate 10/11/2017   Coronary artery disease 08/30/2017   AAA (abdominal aortic aneurysm) without rupture ([place]) 08/30/2017   Acute blood loss anemia 06/07/2017   Hemorrhagic shock ([place]) 06/07/2017   Lower GI bleed 06/05/2017   Sleep apnea 06/05/2017   Hypothyroidism 06/05/2017   Hyperlipidemia 06/05/2017   Asthma 06/05/2017   ILD (interstitial lung disease) ([place]) 05/28/2017   Pneumothorax on right 05/20/2017   COPD (chronic obstructive pulmonary disease) ([place]) 05/20/2017   Pneumothorax 05/20/2017   Skin cancer 10/23/2015    Past Surgical History:  Procedure Laterality Date   bleeding ulcer  2016   BYPASS AXILLA/BRACHIAL ARTERY  1998   colon bleed  2012   COLONOSCOPY N/A 06/07/2017   Procedure: COLONOSCOPY;  Surgeon: Carol Ada, MD;  Location: [facility];  Service: Endoscopy;  Laterality: N/A;   CORONARY ARTERY BYPASS GRAFT     5 vessel   DEBRIDMENT OF DECUBITUS ULCER  2002   EYE SURGERY     cataract   IR ANGIOGRAM SELECTIVE EACH ADDITIONAL VESSEL  03/22/2020   IR ANGIOGRAM  VISCERAL SELECTIVE  03/22/2020   IR EXCHANGE BILIARY DRAIN  12/02/2020   IR EXCHANGE BILIARY DRAIN  02/08/2021   IR EXCHANGE BILIARY DRAIN  04/05/2021   IR PERC CHOLECYSTOSTOMY  09/08/2020   IR RADIOLOGIST EVAL & MGMT  10/06/2020   IR RADIOLOGIST EVAL & MGMT  10/26/2020   IR US GUIDE VASC ACCESS RIGHT  03/22/2020   RIGHT HEART CATH N/A 12/11/2017   Procedure: RIGHT HEART CATH;  Surgeon: Jolaine Artist, MD;  Location: [facility];  Service: Cardiovascular;  Laterality: N/A;   THORACIC AORTA STENT  2009   TRANSURETHRAL RESECTION OF BLADDER TUMOR WITH MITOMYCIN-C N/A 03/09/2021   Procedure: TRANSURETHRAL RESECTION OF BLADDER TUMOR;  Surgeon: Franchot Gallo, MD;   Location: [facility];  Service: Urology;  Laterality: N/A;       Family History  Problem Relation Age of Onset   Emphysema Mother    Stroke Father    Movement disorder Sister    Aneurysm Brother        Thoracic aorta   Macular degeneration Brother     Social History   Tobacco Use   Smoking status: Former    Packs/day: 0.50    Pack years: 0.00    Types: Cigarettes    Quit date: 08/06/1961    Years since quitting: 59.7   Smokeless tobacco: Never  Vaping Use   Vaping Use: Never used  Substance Use Topics   Alcohol use: Not Currently   Drug use: Never    Home Medications Prior to Admission medications   Medication Sig Start Date End Date Taking? Authorizing Provider  cephALEXin (KEFLEX) 250 MG capsule Take 1 capsule (250 mg total) by mouth every 12 (twelve) hours. 03/10/21   Franchot Gallo, MD  fluticasone (FLONASE) 50 MCG/ACT nasal spray Place 1 spray into both nostrils daily as needed for allergies or rhinitis.    [provider]  Fluticasone-Salmeterol,sensor, 113-14 MCG/ACT AEPB Inhale 1 puff into the lungs 2 (two) times daily. 09/29/20   Lyda Jester M, PA-C  ipratropium-albuterol (DUONEB) 0.5-2.5 (3) MG/3ML SOLN Take 3 mLs by nebulization every 6 (six) hours as needed (shortness of breath or wheezing).    [provider]  levothyroxine (SYNTHROID) 100 MCG tablet Take 1 tablet (100 mcg total) by mouth daily at 6 (six) AM. 09/20/20   Carlis Abbott, Venita Sheffield, DO  loratadine (CLARITIN) 10 MG tablet Take 10 mg by mouth daily as needed for allergies.    [provider]  macitentan (OPSUMIT) 10 MG tablet Take 1 tablet (10 mg total) by mouth daily. Needs appt for future refills 08/19/20   Bensimhon, Shaune Pascal, MD  midodrine (PROAMATINE) 10 MG tablet Take 1 tablet (10 mg total) by mouth 3 (three) times daily after meals. In addition with the 5mg  tablets 12/29/20   Bensimhon, Shaune Pascal, MD  midodrine (PROAMATINE) 5 MG tablet Take 1 tablet (5 mg total) by mouth 3  (three) times daily with meals. In addition with the 10mg  tablets 12/29/20   Bensimhon, Shaune Pascal, MD  montelukast (SINGULAIR) 10 MG tablet Take 10 mg by mouth at bedtime.    [provider]  pantoprazole (PROTONIX) 20 MG tablet Take 20 mg by mouth daily.    [provider]  potassium chloride SA (KLOR-CON) 20 MEQ tablet Take 20 mEq by mouth in the morning and at bedtime. 09/27/20   [provider]  predniSONE (DELTASONE) 20 MG tablet Take 2 tablets (40 mg total) by mouth daily with breakfast. 03/27/21  Mannam, Praveen, MD  sildenafil (REVATIO) 20 MG tablet TAKE TWO TABLETS BY MOUTH THREE TIMES A DAY Patient taking differently: Take 40 mg by mouth 3 (three) times daily. 11/02/20   Lyda Jester M, PA-C  simvastatin (ZOCOR) 10 MG tablet Take 10 mg by mouth at bedtime.    [provider]  Sodium Chloride Flush (NORMAL SALINE FLUSH) 0.9 % SOLN USE 5 MLS 2 TIMES DAILY AS DIRECTED 09/22/20 09/22/21  Clovis Riley, MD  torsemide (DEMADEX) 20 MG tablet TAKE TWO TABLETS BY MOUTH DAILY Patient taking differently: Take 40 mg by mouth daily. 01/11/21   Bensimhon, Shaune Pascal, MD    Allergies    Patient has no known allergies.  Review of Systems   Review of Systems  Musculoskeletal:  Positive for back pain.  All other systems reviewed and are negative.  Physical Exam Updated Vital Signs BP (!) 144/70 (BP Location: Right Arm)   Pulse 100   Temp 97.9 F (36.6 C) (Oral)   Resp 16   Ht 5\' 7"  (1.702 m)   Wt 67.1 kg   SpO2 (!) 88%   BMI 23.18 kg/m   Physical Exam Vitals and nursing note reviewed.  Constitutional:      General: He is not in acute distress.    Appearance: Normal appearance. He is well-developed.  HENT:     Head: Normocephalic and atraumatic.     Right Ear: Hearing normal.     Left Ear: Hearing normal.     Nose: Nose normal.  Eyes:     Conjunctiva/sclera: Conjunctivae normal.     Pupils: Pupils are equal, round, and reactive to light.   Cardiovascular:     Rate and Rhythm: Regular rhythm.     Heart sounds: S1 normal and S2 normal. No murmur heard.   No friction rub. No gallop.  Pulmonary:     Effort: Pulmonary effort is normal. No respiratory distress.     Breath sounds: Normal breath sounds.  Chest:     Chest wall: No tenderness.  Abdominal:     General: Bowel sounds are normal.     Palpations: Abdomen is soft.     Tenderness: There is no abdominal tenderness. There is no guarding or rebound. Negative signs include Murphy's sign and McBurney's sign.     Hernia: No hernia is present.  Musculoskeletal:        General: Normal range of motion.     Cervical back: Normal range of motion and neck supple.     Lumbar back: Tenderness present.       Back:  Skin:    General: Skin is warm and dry.     Findings: No rash.  Neurological:     Mental Status: He is alert and oriented to person, place, and time.     GCS: GCS eye subscore is 4. GCS verbal subscore is 5. GCS motor subscore is 6.     Cranial Nerves: No cranial nerve deficit.     Sensory: No sensory deficit.     Coordination: Coordination normal.  Psychiatric:        Speech: Speech normal.        Behavior: Behavior normal.        Thought Content: Thought content normal.    ED Results / Procedures / Treatments   Labs (all labs ordered are listed, but only abnormal results are displayed) Labs Reviewed - No data to display  EKG None  Radiology DG Lumbar Spine Complete  Result Date:  04/27/2021 CLINICAL DATA:  Fall with low back pain EXAM: LUMBAR SPINE - COMPLETE 4+ VIEW COMPARISON:  None. FINDINGS: There is no evidence of lumbar spine fracture. Grade 1 anterolisthesis at L4-5. Multilevel vertebral body height loss. Aortobiiliac stent. Aneurysm coils. IMPRESSION: 1. No acute abnormality of the lumbar spine. Electronically Signed   By: Ulyses Jarred M.D.   On: 04/27/2021 00:49    Procedures Procedures   Medications Ordered in ED Medications   HYDROcodone-acetaminophen (NORCO/VICODIN) 5-325 MG per tablet 1 tablet (1 tablet Oral Given 04/27/21 0049)    ED Course  I have reviewed the triage vital signs and the nursing notes.  Pertinent labs & imaging results that were available during my care of the patient were reviewed by me and considered in my medical decision making (see chart for details).    MDM Rules/Calculators/A&P                          Patient presents to the emergency department for evaluation of back pain after a fall.  Patient reports that he had a mechanical fall caused by wearing slippers.  He fell backwards into a stool, hitting his lower back.  Patient with midline tenderness on exam.  No radiation of the pain.  He did not hit his head or lose consciousness.  X-ray does not show any fracture.  Patient feeling much better after oral analgesia.  Will discharge with analgesia, rest, follow-up as needed.  Final Clinical Impression(s) / ED Diagnoses Final diagnoses:  Lumbar contusion, initial encounter    Rx / DC Orders ED Discharge Orders     None        Ayani Ospina, Gwenyth Allegra, MD 04/27/21 [phone]

## 2021-04-27 NOTE — Telephone Encounter (Signed)
Pts daughter called to reports pt seen in er for fall. Was given a dose of hydrocodone for pain while in er, discharged with script. Daughter wanted to be sure this medication would not interact with opsumit    Will forward to pharmacy team for review    Pts sn in law will return call to schedule followup

## 2021-05-01 ENCOUNTER — Telehealth (HOSPITAL_COMMUNITY): Payer: Self-pay | Admitting: *Deleted

## 2021-05-01 NOTE — Telephone Encounter (Signed)
Pts daughter returned call stating pt is still very short of breath and he is on 5L of O2 and requested an appt. Pt denies swelling or weight gain. Per Darden Palmer pt needs to be seen be palliative care. Pts daughter aware and will contact palliative care.

## 2021-05-02 ENCOUNTER — Telehealth: Payer: Self-pay

## 2021-05-02 ENCOUNTER — Telehealth: Payer: Self-pay | Admitting: Pulmonary Disease

## 2021-05-02 NOTE — Telephone Encounter (Signed)
He can get CXR re: SOB. First available with any APP or Dr. Vaughan Browner as long as he does not have any covid exposure or fevers. He should be taking diuretic daily from the looks of it. He has hx heart failure and pulmonary HTN.

## 2021-05-02 NOTE — Telephone Encounter (Signed)
Primary Pulmonologist: Dr. Vaughan Browner Last office visit and with whom: 11/16/2019 with Dr. Vaughan Browner What do we see them for (pulmonary problems): ILD Last OV assessment/plan: see below  Was appointment offered to patient (explain)?    Assessment:  COPD, asthmatic bronchitis. Seen for exacerbation today. Continue airduo.  He is using it only once daily.  Will increase to twice daily Treat with Z-Pak, prednisone for 5 days Chest x-ray Continue supplemental oxygen.   Mild ILD, postinflammatory lung fibrosis Previous CTs have apparently shown a calcified RUL scarring and mild ILD. The repeat high res resolution CT shows stability of findings from 2016 - 2019. I believe this may be from prior infections and exposures during his time in [place] and during world war.  He will not need to repeat CTs.  This can be followed up on chest x-rays.   OSA Continues on BiPAP with good compliance.   Heart failure, pulmonary hypertension Follow with cardiology and advanced heart failure service  On sildenafil, opsumit   Health maintenance 08/05/2017- Prevnar 13 10/22/2002- Pneumovax   Plan/Recommendations: - Continue airduo, O2, bipap - CXR, Z pack, prednisone   Praveen Mannam MD [facility]       Reason for call: I called and spoke with Beth from [facility] regarding patient who is in pallative care who stated the patient is even more SOB and now having green sputum production which is new this week. Beth stated patient breathing has been getting worse since bladder procedure about 6 weeks ago. Patient is not complaining of chest tightness/pain, fever, chills or N/V. Patient is on trilogy machine and on 3L at home. Patient has not take fluid pills in 3-4 days but Beth does not see any swelling anywhere so does not think this is an issue. Patient has been taking Airduo twice daily as prescribed.Eustaquio Maize is requesting an CXR and maybe OV. Will route to [facility] as Dr. Vaughan Browner is on vacation.  Please call daughter with recs as she is over care. Number is [phone].  Beth please advise. Thanks!  (examples of things to ask: : When did symptoms start? Fever? Cough? Productive? Color to sputum? More sputum than usual? Wheezing? Have you needed increased oxygen? Are you taking your respiratory medications? What over the counter measures have you tried?)  No Known Allergies  Immunization History  Administered Date(s) Administered   Influenza, High Dose Seasonal PF 07/27/2016, 08/05/2017, 08/10/2019   Pneumococcal Conjugate-13 08/05/2017   Pneumococcal Polysaccharide-23 10/22/2002, 08/08/2018   Pneumococcal-Unspecified 10/22/2002

## 2021-05-02 NOTE — Telephone Encounter (Signed)
I called and spoke with daughter Larene Beach who is on DPR regarding Beth recs. Larene Beach verbalized understanding and we made an appt with TP on 05/23/21 as this in the next available with anyone and put in order for CXR prior to visit. I also informed daughter to have patient take fluid pill due to his conditions. Daughter verbalized understanding and will call if symptoms persist/worsen. Nothing further needed.

## 2021-05-02 NOTE — Telephone Encounter (Signed)
Late Entry for 05/01/21:    Received message from patient's daughter that patient continues with shortness of breath. Currently on 5 Liters of Oxygen. Reached out to [place] with [facility] to provide update and request direction. Magda Paganini shared patient and daughter encouraged to seek emergent care.

## 2021-05-02 NOTE — Telephone Encounter (Signed)
Beth from [facility]  calling because pt has been having shortness of breath since his bladder surgery(a few months ago). Beth said today pt , pt has Trilogy machine, and today it  took 27min for pt's sat up to 90%. Pt has not taking fluid pills the last 3-4 days,but breathing is no different from last week. Pt has been coughing up green sputum for the last week. Pt also has crackles in the bases of his lungs. Pt fell last week, weaker than he has been. Please call Beth for more info on pt [phone]. Pt daughter is the best contact for pt-can be reached at [phone].

## 2021-05-02 NOTE — Telephone Encounter (Signed)
Follow up call placed to patient's daughter to check in on patient. Daughter shared that she did not feel that patient needed to go to the hospital but feels patient would benefit from seeing a provider. Patient continues to have shortness of breath and has increased his oxygen to 5 liters n/c. Patient scheduled to be seen by Palliative MD on 7/19. Beth RN with [facility] to visit today. Reached out to [facility] for follow up.

## 2021-05-05 ENCOUNTER — Other Ambulatory Visit: Payer: Self-pay

## 2021-05-05 ENCOUNTER — Other Ambulatory Visit: Payer: Medicare Other | Admitting: Nurse Practitioner

## 2021-05-05 ENCOUNTER — Telehealth: Payer: Self-pay | Admitting: Pulmonary Disease

## 2021-05-05 ENCOUNTER — Telehealth: Payer: Self-pay

## 2021-05-05 VITALS — BP 118/56 | HR 62 | Resp 20

## 2021-05-05 DIAGNOSIS — R059 Cough, unspecified: Secondary | ICD-10-CM

## 2021-05-05 DIAGNOSIS — Z515 Encounter for palliative care: Secondary | ICD-10-CM

## 2021-05-05 MED ORDER — DOXYCYCLINE HYCLATE 100 MG PO TABS: 100.0000 mg | ORAL_TABLET | Freq: Two times a day (BID) | ORAL | 0 refills | Status: DC

## 2021-05-05 NOTE — Telephone Encounter (Signed)
Called and spoke with Vincent Robinson with [facility]. She stated that she visited the patient this morning and he was having issues with increased SOB and a productive cough. He has been coughing up green phlegm for the past 3-4 days. His SOB has been going on since at least Monday. Patient informed her that he had stopped taking his torsemide 40mg  for 3 days but restarted yesterday. No reason was given for him stopping this medication. He has been in contact with cardiology per his chart.   He is currently on 5L of O2. His O2 was at 94%. She denied any fevers but he has constant chills due to be being anemic.   He is scheduled for a F/U with TP on 05/23/21 at 4pm. I explained to her that this was the soonest that we could see him. His last OV was with Dr. Vaughan Browner in January 2021. I advised her if he needed to seen sooner, he would need to go to an UC or ED.   Denied being around anyone who has been sick recently.   Pharmacy is [facility] on [facility].   Dr. Halford Chessman, can you please advise since Dr. Vaughan Browner is not available! Thanks!

## 2021-05-05 NOTE — Telephone Encounter (Signed)
Rx for Doxy 100 has been sent to preferred pharmacy. Both patient's daughter, Sharon(DPR) and Angelene Giovanni are aware. Nothing further needed at this time.

## 2021-05-05 NOTE — Progress Notes (Addendum)
[facility] Consult Note Telephone: [phone]  Fax: [phone]   Date of encounter: 05/05/21 PATIENT NAME: Vincent Robinson [address] [place] [address]   [phone] (home)  DOB: 1922/11/27 MRN: [id number]  PRIMARY CARE PROVIDER:    Katherina Mires, MD,  [place] [address] [phone]  REFERRING PROVIDER:   Katherina Mires, MD [address] [place],  [place] [address] [phone]  RESPONSIBLE PARTY:    Contact Information     Name Relation Home Work [place] Daughter [phone]  [phone]   Vincent Robinson,Vincent Robinson Relative [phone]  [phone]     I met face to face with patient in home.  Patient's daughter Vincent Robinson present during visit.  Palliative Care was asked to follow this patient by consultation request of  Vincent Mires, MD to address advance care planning and complex medical decision making. This is a follow up visit.                                  ASSESSMENT AND PLAN / RECOMMENDATIONS:   Advance Care Planning/Goals of Care: CODE STATUS: Full Code Directives: Signed MOST form on Vincent Robinson Epic EMR, details of MOST form include Attempt at resuscitation, limited additional intervention, antibiotics if indicated, IV fluids for a defined trial period, no feeding tube.  Symptom Management/Plan: Cough: Cough with greenish sputum, report coughing more than he used to. Symptom started 5 days ago associated with increased SOB, dyspnea on exertion and desaturation to low 80s. Report cough is better since restarting diuretic, he missed his Diuretic for 3 days. Denied chest pain, denied fever, has chills at baseline (cold all the time due to anemia). Last antibiotics use was 2 months ago. Patient awake and alert, no evidence of acute distress observed.  Plan: Phone call made to his pulmonologist office Vincent Robinson, not in the clinic today,  spoke with his nurse, message with patient's compliant left for the oncall pulmonologist. Recommend over the counter Mucinex as needed. Encouraged cough and deep breathing exercises. Continue Torsemide 59m daily. [facility]. Patient to call if he does not hear from Pulmonology by 4pm today being Friday. Patient verbalized understanding. Contingency plan will be to start patient on antibiotics for COPD exacerbation.  Addendum Call received from patient pulmonologist clinic, made aware that Prescription for Doxycycline 1036mtwice a day for 7 days is been sent to his pharmacy.   Provided general support and encouragement. Questions and concerns were addressed. Patient and his daughter was encouraged to call Vincent Robinson with questions and/or concerns.  Follow up Palliative Care Visit: Palliative care will continue to follow for complex medical decision making, advance care planning, and clarification of goals. Return as needed.  PPS: 60%  HOSPICE ELIGIBILITY/DIAGNOSIS: TBD  CHIEF COMPLAIN: cough with greenish sputum  History obtained from review of Epic EMR and discussion with Mr. SmBellandnd his daughter.  HISTORY OF PRESENT ILLNESS:  JaJORDYNN PERRIERs a [age].o. year old male with multiple medical medical problem including COPD dependent on supplemental oxygen, CAD s/p CABG, pulmonary hypertension, moderate aortic stenosis, atrial fibrillation-not a candidate for oral anticoagulation d/t increased bleeding risk. Patient called the palliative care office today with complaint of cough with greenish sputum. Daughter report patient having worsening short of breath. Daughter reported breathing changed after patient  had bladder surgery last month but has worsened in the last week.  Daughter report patient is in the process of moving into and independent living facility.  I reviewed available labs, medications, imaging, studies and related  documents from the EMR.  Records reviewed and summarized above.   Vitals:   05/05/21 1421  BP: (!) 118/56  Pulse: 62  Resp: 20  SpO2: 94% Comment: on 5L    Physical Exam: General: frail appearing, thin, NAD  EYES: anicteric sclera, no discharge  ENMT: oral mucous membranes moist CV: S1S2 normal, no LE edema Pulmonary: LCTA, no increased work of breathing, noted at rest, congested cough  Abdomen:  no ascites Skin: warm and dry Neuro:  no cognitive impairment Psych: non-anxious affect, A and O x 3 Hem/lymph/immuno: no widespread bruising  Past Medical History:  Diagnosis Date   AAA (abdominal aortic aneurysm) (HCC)    Acute respiratory failure (HCC) 11/23/2019   Asthma    Basal cell carcinoma    CHF (congestive heart failure) (HCC)    Chronic respiratory failure (HCC)    COPD (chronic obstructive pulmonary disease) (HCC)    Coronary artery disease    Diverticulitis    Dysrhythmia    afib   History of chronic respiratory failure    Hyperlipidemia    Hypothyroidism    Moderate aortic stenosis    Myocardial infarction ([facility])    Pneumonia    Pulmonary HTN (HCC)    Right heart failure (HCC)    Skin cancer 2017   bladder    Sleep apnea    Bipap    I spent 30 minutes providing this consultation. More than 50% of the time in this consultation was spent in counseling and care coordination.  Thank you for the opportunity to participate in the care of [name].  The palliative care team will continue to follow. Please call our office at [phone] if we can be of additional assistance.   Vincent Favre, DNP, AGPCNP-BC  COVID-19 PATIENT SCREENING TOOL Asked and negative response unless otherwise noted:   Have you had symptoms of covid, tested positive or been in contact with someone with symptoms/positive test in the past 5-10 days?

## 2021-05-05 NOTE — Telephone Encounter (Signed)
Visit previously scheduled for 05/09/21 moved until today 05/05/21 with Loyal Gambler

## 2021-05-05 NOTE — Telephone Encounter (Signed)
Can send script for doxycycline 100 mg bid for 7 days. 

## 2021-05-22 ENCOUNTER — Other Ambulatory Visit: Payer: Self-pay | Admitting: Adult Health

## 2021-05-22 DIAGNOSIS — R0602 Shortness of breath: Secondary | ICD-10-CM

## 2021-05-22 DIAGNOSIS — J849 Interstitial pulmonary disease, unspecified: Secondary | ICD-10-CM

## 2021-05-22 NOTE — Progress Notes (Signed)
This is for a repeat CXR prior to visit.

## 2021-05-23 ENCOUNTER — Ambulatory Visit: Payer: Medicare Other | Admitting: Adult Health

## 2021-05-23 ENCOUNTER — Ambulatory Visit: Payer: Medicare Other

## 2021-06-02 ENCOUNTER — Telehealth (HOSPITAL_COMMUNITY): Payer: Self-pay | Admitting: *Deleted

## 2021-06-02 NOTE — Telephone Encounter (Signed)
Left detailed VM for Vincent Robinson. Pt recently moved to assisted living facility asked Vincent Robinson if pt can have labs drawn there if not he will need to come in office for labs. Waiting for return call from Beth,RN.

## 2021-06-02 NOTE — Telephone Encounter (Signed)
Beth with [facility] called to report pt c/o swelling and weeping in his legs. Pts weight is 153.7lbs up 6.5lbs in 1 week. No increased shortness of breath.  Pt has been compliant with meds. Beth asked if we needed to increase diuretic.  Routed to [facility] for advice  Call back # [phone]

## 2021-06-05 ENCOUNTER — Ambulatory Visit (HOSPITAL_COMMUNITY)
Admission: RE | Admit: 2021-06-05 | Discharge: 2021-06-05 | Disposition: A | Discharge status: Home / Self Care | Payer: Medicare Other | Source: Ambulatory Visit | Attending: Interventional Radiology | Admitting: Interventional Radiology

## 2021-06-05 ENCOUNTER — Other Ambulatory Visit: Payer: Self-pay

## 2021-06-05 DIAGNOSIS — Z434 Encounter for attention to other artificial openings of digestive tract: Secondary | ICD-10-CM | POA: Diagnosis present

## 2021-06-05 DIAGNOSIS — K819 Cholecystitis, unspecified: Secondary | ICD-10-CM | POA: Diagnosis not present

## 2021-06-05 HISTORY — PX: IR EXCHANGE BILIARY DRAIN: IMG6046

## 2021-06-05 MED ORDER — IOHEXOL 240 MG/ML SOLN
50.0000 mL | Freq: Once | INTRAMUSCULAR | Status: AC | PRN
  Administered 2021-06-05: 10 mL

## 2021-06-05 MED ORDER — LIDOCAINE HCL (PF) 1 % IJ SOLN
INTRAMUSCULAR | Status: DC | PRN
  Administered 2021-06-05: 10 mL

## 2021-06-05 MED ORDER — LIDOCAINE HCL 1 % IJ SOLN
INTRAMUSCULAR | Status: AC
  Filled 2021-06-05: qty 20

## 2021-06-05 NOTE — Procedures (Signed)
Interventional Radiology Procedure Note  Date of Procedure: 06/05/2021  Procedure: Chole tube exchange   Findings:  1. Successful exchange of chronic chole tube for new 10.2 Fr drainage catheter.    Complications: No immediate complications noted.   Estimated Blood Loss: minimal  Follow-up and Recommendations: 1. Return for routine tube care in 8-10 weeks.    Albin Felling, MD  Vascular & Interventional Radiology  06/05/2021 2:40 PM

## 2021-06-14 ENCOUNTER — Other Ambulatory Visit (HOSPITAL_COMMUNITY): Payer: Self-pay | Admitting: Interventional Radiology

## 2021-06-14 DIAGNOSIS — K819 Cholecystitis, unspecified: Secondary | ICD-10-CM

## 2021-06-30 ENCOUNTER — Ambulatory Visit: Payer: Medicare Other | Admitting: Cardiovascular Disease

## 2021-07-08 ENCOUNTER — Emergency Department (HOSPITAL_COMMUNITY): Payer: Medicare Other

## 2021-07-08 ENCOUNTER — Other Ambulatory Visit: Payer: Self-pay

## 2021-07-08 ENCOUNTER — Inpatient Hospital Stay (HOSPITAL_COMMUNITY)
Admission: EM | Admit: 2021-07-08 | Discharge: 2021-07-11 | DRG: 291 | Disposition: A | Discharge status: Home Health | Payer: Medicare Other | Attending: Internal Medicine | Admitting: Internal Medicine

## 2021-07-08 DIAGNOSIS — W44F3XA Food entering into or through a natural orifice, initial encounter: Secondary | ICD-10-CM

## 2021-07-08 DIAGNOSIS — I25119 Atherosclerotic heart disease of native coronary artery with unspecified angina pectoris: Secondary | ICD-10-CM

## 2021-07-08 DIAGNOSIS — I4891 Unspecified atrial fibrillation: Secondary | ICD-10-CM | POA: Diagnosis present

## 2021-07-08 DIAGNOSIS — J9601 Acute respiratory failure with hypoxia: Secondary | ICD-10-CM

## 2021-07-08 DIAGNOSIS — Z79899 Other long term (current) drug therapy: Secondary | ICD-10-CM | POA: Diagnosis not present

## 2021-07-08 DIAGNOSIS — K222 Esophageal obstruction: Secondary | ICD-10-CM | POA: Diagnosis not present

## 2021-07-08 DIAGNOSIS — I480 Paroxysmal atrial fibrillation: Secondary | ICD-10-CM

## 2021-07-08 DIAGNOSIS — I5082 Biventricular heart failure: Secondary | ICD-10-CM | POA: Diagnosis present

## 2021-07-08 DIAGNOSIS — I5033 Acute on chronic diastolic (congestive) heart failure: Secondary | ICD-10-CM | POA: Diagnosis present

## 2021-07-08 DIAGNOSIS — D509 Iron deficiency anemia, unspecified: Secondary | ICD-10-CM | POA: Diagnosis present

## 2021-07-08 DIAGNOSIS — Z7951 Long term (current) use of inhaled steroids: Secondary | ICD-10-CM | POA: Diagnosis not present

## 2021-07-08 DIAGNOSIS — I252 Old myocardial infarction: Secondary | ICD-10-CM | POA: Diagnosis not present

## 2021-07-08 DIAGNOSIS — J449 Chronic obstructive pulmonary disease, unspecified: Secondary | ICD-10-CM | POA: Diagnosis present

## 2021-07-08 DIAGNOSIS — Z87891 Personal history of nicotine dependence: Secondary | ICD-10-CM | POA: Diagnosis not present

## 2021-07-08 DIAGNOSIS — T18128A Food in esophagus causing other injury, initial encounter: Secondary | ICD-10-CM

## 2021-07-08 DIAGNOSIS — I509 Heart failure, unspecified: Secondary | ICD-10-CM

## 2021-07-08 DIAGNOSIS — I2729 Other secondary pulmonary hypertension: Secondary | ICD-10-CM | POA: Diagnosis present

## 2021-07-08 DIAGNOSIS — Z85828 Personal history of other malignant neoplasm of skin: Secondary | ICD-10-CM

## 2021-07-08 DIAGNOSIS — E782 Mixed hyperlipidemia: Secondary | ICD-10-CM | POA: Diagnosis not present

## 2021-07-08 DIAGNOSIS — Z8679 Personal history of other diseases of the circulatory system: Secondary | ICD-10-CM

## 2021-07-08 DIAGNOSIS — J849 Interstitial pulmonary disease, unspecified: Secondary | ICD-10-CM | POA: Diagnosis not present

## 2021-07-08 DIAGNOSIS — I272 Pulmonary hypertension, unspecified: Secondary | ICD-10-CM | POA: Diagnosis not present

## 2021-07-08 DIAGNOSIS — J9621 Acute and chronic respiratory failure with hypoxia: Secondary | ICD-10-CM | POA: Diagnosis present

## 2021-07-08 DIAGNOSIS — E785 Hyperlipidemia, unspecified: Secondary | ICD-10-CM | POA: Diagnosis present

## 2021-07-08 DIAGNOSIS — R0602 Shortness of breath: Secondary | ICD-10-CM | POA: Diagnosis present

## 2021-07-08 DIAGNOSIS — Z79890 Hormone replacement therapy (postmenopausal): Secondary | ICD-10-CM | POA: Diagnosis not present

## 2021-07-08 DIAGNOSIS — I4510 Unspecified right bundle-branch block: Secondary | ICD-10-CM | POA: Diagnosis present

## 2021-07-08 DIAGNOSIS — I2510 Atherosclerotic heart disease of native coronary artery without angina pectoris: Secondary | ICD-10-CM | POA: Diagnosis present

## 2021-07-08 DIAGNOSIS — I5031 Acute diastolic (congestive) heart failure: Secondary | ICD-10-CM | POA: Diagnosis not present

## 2021-07-08 DIAGNOSIS — Z7952 Long term (current) use of systemic steroids: Secondary | ICD-10-CM | POA: Diagnosis not present

## 2021-07-08 DIAGNOSIS — E039 Hypothyroidism, unspecified: Secondary | ICD-10-CM | POA: Diagnosis present

## 2021-07-08 DIAGNOSIS — Z825 Family history of asthma and other chronic lower respiratory diseases: Secondary | ICD-10-CM | POA: Diagnosis not present

## 2021-07-08 DIAGNOSIS — G4730 Sleep apnea, unspecified: Secondary | ICD-10-CM | POA: Diagnosis present

## 2021-07-08 DIAGNOSIS — Z20822 Contact with and (suspected) exposure to covid-19: Secondary | ICD-10-CM | POA: Diagnosis present

## 2021-07-08 DIAGNOSIS — Z951 Presence of aortocoronary bypass graft: Secondary | ICD-10-CM | POA: Diagnosis not present

## 2021-07-08 DIAGNOSIS — R9431 Abnormal electrocardiogram [ECG] [EKG]: Secondary | ICD-10-CM | POA: Diagnosis not present

## 2021-07-08 DIAGNOSIS — Z8551 Personal history of malignant neoplasm of bladder: Secondary | ICD-10-CM | POA: Diagnosis not present

## 2021-07-08 DIAGNOSIS — J9620 Acute and chronic respiratory failure, unspecified whether with hypoxia or hypercapnia: Secondary | ICD-10-CM | POA: Diagnosis present

## 2021-07-08 LAB — CBC WITH DIFFERENTIAL/PLATELET
Abs Immature Granulocytes: 0.05 10*3/uL (ref 0.00–0.07)
Basophils Absolute: 0.1 10*3/uL (ref 0.0–0.1)
Basophils Relative: 1 %
Eosinophils Absolute: 0.1 10*3/uL (ref 0.0–0.5)
Eosinophils Relative: 1 %
HCT: 38.1 % — ABNORMAL LOW (ref 39.0–52.0)
Hemoglobin: 11.7 g/dL — ABNORMAL LOW (ref 13.0–17.0)
Immature Granulocytes: 1 %
Lymphocytes Relative: 17 %
Lymphs Abs: 1.8 10*3/uL (ref 0.7–4.0)
MCH: 26.6 pg (ref 26.0–34.0)
MCHC: 30.7 g/dL (ref 30.0–36.0)
MCV: 86.6 fL (ref 80.0–100.0)
Monocytes Absolute: 0.8 10*3/uL (ref 0.1–1.0)
Monocytes Relative: 7 %
Neutro Abs: 8.2 10*3/uL — ABNORMAL HIGH (ref 1.7–7.7)
Neutrophils Relative %: 73 %
Platelets: 285 10*3/uL (ref 150–400)
RBC: 4.4 MIL/uL (ref 4.22–5.81)
WBC: 11 10*3/uL — ABNORMAL HIGH (ref 4.0–10.5)
nRBC: 0 % (ref 0.0–0.2)

## 2021-07-08 LAB — RESP PANEL BY RT-PCR (FLU A&B, COVID) ARPGX2
Influenza A by PCR: NEGATIVE
Influenza B by PCR: NEGATIVE
SARS Coronavirus 2 by RT PCR: NEGATIVE

## 2021-07-08 LAB — COMPREHENSIVE METABOLIC PANEL
ALT: 17 U/L (ref 0–44)
AST: 28 U/L (ref 15–41)
Albumin: 4.2 g/dL (ref 3.5–5.0)
Alkaline Phosphatase: 91 U/L (ref 38–126)
Anion gap: 12 (ref 5–15)
BUN: 22 mg/dL (ref 8–23)
CO2: 28 mmol/L (ref 22–32)
Calcium: 8.9 mg/dL (ref 8.9–10.3)
Chloride: 98 mmol/L (ref 98–111)
Creatinine, Ser: 1.04 mg/dL (ref 0.61–1.24)
GFR, Estimated: >60 mL/min (ref >60)
Glucose, Bld: 95 mg/dL (ref 70–99)
Potassium: 3.5 mmol/L (ref 3.5–5.1)
Sodium: 138 mmol/L (ref 135–145)
Total Bilirubin: 1 mg/dL (ref 0.3–1.2)
Total Protein: 8.8 g/dL — ABNORMAL HIGH (ref 6.5–8.1)

## 2021-07-08 LAB — BRAIN NATRIURETIC PEPTIDE: B Natriuretic Peptide: 444.5 pg/mL — ABNORMAL HIGH (ref 0.0–100.0)

## 2021-07-08 MED ORDER — SODIUM CHLORIDE 0.9 % IV SOLN
3.0000 g | Freq: Once | INTRAVENOUS | Status: AC
  Administered 2021-07-09: 3 g via INTRAVENOUS
  Filled 2021-07-08: qty 8

## 2021-07-08 MED ORDER — LORATADINE 10 MG PO TABS
10.0000 mg | ORAL_TABLET | Freq: Once | ORAL | Status: DC
  Filled 2021-07-08: qty 1

## 2021-07-08 MED ORDER — FUROSEMIDE 10 MG/ML IJ SOLN
20.0000 mg | Freq: Two times a day (BID) | INTRAMUSCULAR | Status: DC
  Administered 2021-07-09: 20 mg via INTRAVENOUS
  Filled 2021-07-08: qty 2

## 2021-07-08 MED ORDER — METHYLPREDNISOLONE SODIUM SUCC 125 MG IJ SOLR
125.0000 mg | Freq: Once | INTRAMUSCULAR | Status: AC
  Administered 2021-07-08: 125 mg via INTRAVENOUS
  Filled 2021-07-08: qty 2

## 2021-07-08 MED ORDER — PANTOPRAZOLE SODIUM 40 MG IV SOLR
40.0000 mg | Freq: Two times a day (BID) | INTRAVENOUS | Status: DC
  Administered 2021-07-08 – 2021-07-09 (×2): 40 mg via INTRAVENOUS
  Filled 2021-07-08 (×2): qty 40

## 2021-07-08 MED ORDER — FUROSEMIDE 10 MG/ML IJ SOLN
20.0000 mg | Freq: Once | INTRAMUSCULAR | Status: AC
  Administered 2021-07-08: 20 mg via INTRAVENOUS
  Filled 2021-07-08: qty 4

## 2021-07-08 MED ORDER — GLUCAGON HCL RDNA (DIAGNOSTIC) 1 MG IJ SOLR
1.0000 mg | Freq: Once | INTRAMUSCULAR | Status: AC
  Administered 2021-07-08: 1 mg via INTRAVENOUS
  Filled 2021-07-08: qty 1

## 2021-07-08 NOTE — H&P (Signed)
Vincent Robinson [id number] DOB: 1923-02-21 DOA: 07/08/2021     PCP: Katherina Mires, MD   Outpatient Specialists:   CARDS:  Dr. Jeffie Pollock   Pulmonary  Dr.Mannam   Urology Dr. Diona Fanti  Patient arrived to ER on 07/08/21 at 1932 Referred by Attending Milton Ferguson, MD   Patient coming from:   From facility  [facility] in [place]  Chief Complaint:   Chief Complaint  Patient presents with   Shortness of Breath   Dysphagia    HPI: Vincent Robinson is a 87 y.o. male with medical history significant of  COPD, CHF, and wears 3L of O2, bladder cancer  Biliary drain   Presented with worsening shortness of breath noted to be satting 81% on baseline 3 L of oxygen  States he had some hot soup this AM and ever since this AM he cannot swollow anything  Had a  dry cough typical for when he gets fluid oveload, slightly increased leg swelling Patient states he has been coughing on and off but now has more green sputum production.  No fever though. Seems to be coughing somewhat worse after he eats.  Has  been vaccinated against COVID   and boosted   Initial COVID TEST  NEGATIVE   Lab Results  Component Value Date   SARSCOV2NAA NEGATIVE 07/08/2021   [place] NEGATIVE 03/06/2021   [place] POSITIVE (A) 11/13/2020   [place] NEGATIVE 09/08/2020    Regarding pertinent Chronic problems:     Hyperlipidemia -  on statins Zocor    Hypoptention Midodrine 3 time a day 15 mg tid     chronic CHF diastolic  - last echo November 2021 with grade 2 diastolic dysfunction Torsemide Macitentan   Pulmonary hypertension - on sildenafil   CAD  - On  , statin,                  -  ollowed by cardiology                    Hypothyroidism:  Lab Results  Component Value Date   TSH 0.827 09/08/2020   on synthroid    COPD - not **followed by pulmonology  on baseline oxygen  3L,     OSA -on nocturnal oxygen,  CPAP,    A. Fib -  - CHA2DS2 vas score      4    Not on  anticoagulation secondary to Risk of Falls,    Sp Billiary drain chronic last time replaced August 15th    Chronic anemia - baseline hg Hemoglobin & Hematocrit  Recent Labs    11/18/20 0448 03/06/21 1155 07/08/21 2100  HGB 10.8* 9.4* 11.7*     While in ER: Chest x-ray showing bilateral edema Patient was given a dose of Lasix 20 mg IV Given glucagon but still unable to swollow   ED Triage Vitals  Enc Vitals Group     BP 07/08/21 1948 130/61     Pulse Rate 07/08/21 1948 94     Resp 07/08/21 1948 (!) 24     Temp 07/08/21 1948 98.9 F (37.2 C)     Temp Source 07/08/21 1948 Oral     SpO2 07/08/21 1948 92 %     Weight 07/08/21 1949 154 lb (69.9 kg)     Height 07/08/21 1949 '5\' 7"'$  (1.702 m)     Head Circumference --      Peak Flow --      Pain  Score 07/08/21 1949 0     Pain Loc --      Pain Edu? --      Excl. in [place]? --   TMAX(24)@     _________________________________________ Significant initial  Findings: Abnormal Labs Reviewed  CBC WITH DIFFERENTIAL/PLATELET - Abnormal; Notable for the following components:      Result Value   WBC 11.0 (*)    Hemoglobin 11.7 (*)    HCT 38.1 (*)    Neutro Abs 8.2 (*)    All other components within normal limits  COMPREHENSIVE METABOLIC PANEL - Abnormal; Notable for the following components:   Total Protein 8.8 (*)    All other components within normal limits   ____________________________________________ Ordered    CXR -cardiomegaly and edema   _________________________ Troponin ordered ECG: Ordered Personally reviewed by me showing: HR : 108 Rhythm:  Sinus tachycardia    no evidence of ischemic changes QTC 518  The recent clinical data is shown below. Vitals:   07/08/21 1948 07/08/21 1949 07/08/21 2200  BP: 130/61  122/73  Pulse: 94  (!) 103  Resp: (!) 24  (!) 21  Temp: 98.9 F (37.2 C)    TempSrc: Oral    SpO2: 92%  90%  Weight:  69.9 kg   Height:  '5\' 7"'$  (1.702 m)     WBC     Component Value Date/Time   WBC  11.0 (H) 07/08/2021 2100   LYMPHSABS 1.8 07/08/2021 2100   MONOABS 0.8 07/08/2021 2100   EOSABS 0.1 07/08/2021 2100   BASOSABS 0.1 07/08/2021 2100      UA   ordered     Results for orders placed or performed during the hospital encounter of 07/08/21  Resp Panel by RT-PCR (Flu A&B, Covid) Nasopharyngeal Swab     Status: None   Collection Time: 07/08/21  9:21 PM   Specimen: Nasopharyngeal Swab; Nasopharyngeal(NP) swabs in vial transport medium  Result Value Ref Range Status   SARS Coronavirus 2 by RT PCR NEGATIVE NEGATIVE Final         Influenza A by PCR NEGATIVE NEGATIVE Final   Influenza B by PCR NEGATIVE NEGATIVE Final           _______________________________________________________ ER Provider Called:  [facility]   Dr. Alessandra Bevels They Recommend admit to medicine   Will see in AM    _______________________________________________ Hospitalist was called for admission for CHF exacerbation and possible food impaction   The following Work up has been ordered so far:  Orders Placed This Encounter  Procedures   Procedural/ Surgical Case Request: ESOPHAGOGASTRODUODENOSCOPY (EGD) WITH PROPOFOL   Resp Panel by RT-PCR (Flu A&B, Covid) Nasopharyngeal Swab   DG Chest Port 1 View   CBC with Differential/Platelet   Comprehensive metabolic panel   Brain natriuretic peptide   Diet NPO time specified   Ice chips   Consult to gastroenterology   Consult to hospitalist   Airborne and Contact precautions   EKG 12-Lead   Saline lock IV    Following Medications were ordered in ER: Medications  pantoprazole (PROTONIX) injection 40 mg (40 mg Intravenous Given 07/08/21 2220)  methylPREDNISolone sodium succinate (SOLU-MEDROL) 125 mg/2 mL injection 125 mg (125 mg Intravenous Given 07/08/21 2101)  furosemide (LASIX) injection 20 mg (20 mg Intravenous Given 07/08/21 2144)  glucagon (human recombinant) (GLUCAGEN) injection 1 mg (1 mg Intravenous Given 07/08/21 2220)        Consult Orders   (From admission, onward)  Start     Ordered   07/08/21 2206  Consult to hospitalist  Once       Provider:  (Not yet assigned)  Question Answer Comment  Place call to: [facility],    call [phone]   Reason for Consult Admit      07/08/21 2206             OTHER Significant initial  Findings:  labs showing:    Recent Labs  Lab 07/08/21 2100  NA 138  K 3.5  CO2 28  GLUCOSE 95  BUN 22  CREATININE 1.04  CALCIUM 8.9    Cr   stable,   Lab Results  Component Value Date   CREATININE 1.04 07/08/2021   CREATININE 1.09 03/06/2021   CREATININE 1.01 12/29/2020    Recent Labs  Lab 07/08/21 2100  AST 28  ALT 17  ALKPHOS 91  BILITOT 1.0  PROT 8.8*  ALBUMIN 4.2   Lab Results  Component Value Date   CALCIUM 8.9 07/08/2021   PHOS 3.8 09/09/2020    Plt: Lab Results  Component Value Date   PLT 285 07/08/2021       Recent Labs  Lab 07/08/21 2100  WBC 11.0*  NEUTROABS 8.2*  HGB 11.7*  HCT 38.1*  MCV 86.6  PLT 285    HG/HCT  stable,      Component Value Date/Time   HGB 11.7 (L) 07/08/2021 2100   HCT 38.1 (L) 07/08/2021 2100   MCV 86.6 07/08/2021 2100     BNP (last 3 results) Recent Labs    09/16/20 1337 11/13/20 2234 12/29/20 1202  BNP 370.2* 299.6* 241.7*    DM  labs:  HbA1C: Recent Labs    09/08/20 0740  HGBA1C 5.2    CBG (last 3)  No results for input(s): GLUCAP in the last 72 hours.   cultures:    Component Value Date/Time   SDES BLOOD SITE NOT SPECIFIED 11/13/2020 2234   SDES BLOOD SITE NOT SPECIFIED 11/13/2020 2234   SPECREQUEST  11/13/2020 2234    BOTTLES DRAWN AEROBIC AND ANAEROBIC Blood Culture adequate volume   SPECREQUEST  11/13/2020 2234    BOTTLES DRAWN AEROBIC AND ANAEROBIC Blood Culture adequate volume   CULT  11/13/2020 2234    NO GROWTH 5 DAYS Performed at [facility], [place] [address] [place], [place] [address]    CULT  11/13/2020 2234    NO GROWTH 5 DAYS Performed at [facility], [place] [address], [place], [place] [address]    REPTSTATUS 11/19/2020 FINAL 11/13/2020 2234   REPTSTATUS 11/18/2020 FINAL 11/13/2020 2234     Radiological Exams on Admission: DG Chest Port 1 View  Result Date: 07/08/2021 CLINICAL DATA:  Shortness of breath. Hx of AAA, asthma, CHF, COPD, CAD, MI, pna, pulm HTN, CABG. EXAM: PORTABLE CHEST 1 VIEW COMPARISON:  Chest x-ray 11/13/2020, CT chest 11/14/2020. FINDINGS: Cardiomegaly. The heart and mediastinal contours are unchanged. Prominence of the perihilar regions again noted. Aortic calcification. Bilateral lung apices with limited evaluation due to overlying mandible. Right costophrenic angle scarring/atelectasis. No focal consolidation. Increased interstitial markings. No pleural effusion. No pneumothorax. No acute osseous abnormality. IMPRESSION: 1. Cardiomegaly with pulmonary edema. 2. Bilateral lung apices with limited evaluation due to overlying mandible. Electronically Signed   By: Iven Finn M.D.   On: 07/08/2021 20:44   _______________________________________________________________________________________________________ Latest  Blood pressure 122/73, pulse (!) 103, temperature 98.9 F (37.2 C), temperature source Oral, resp. rate (!) 21, height  $'5\' 7"'f$  (1.702 m), weight 69.9 kg, SpO2 90 %.   Review of Systems:    Pertinent positives include:   fatigue shortness of breath at rest. Trouble swallowing Constitutional:  No weight loss, night sweats, Fevers, chills,, weight loss  HEENT:  No headaches, Difficulty swallowing,Tooth/dental problems,Sore throat,  No sneezing, itching, ear ache, nasal congestion, post nasal drip,  Cardio-vascular:  No chest pain, Orthopnea, PND, anasarca, dizziness, palpitations.no Bilateral lower extremity swelling  GI:  No heartburn, indigestion, abdominal pain, nausea, vomiting, diarrhea, change in bowel habits, loss of appetite, melena, blood in stool, hematemesis Resp:  no No dyspnea on exertion,  No excess mucus, no productive cough, No non-productive cough, No coughing up of blood.No change in color of mucus.No wheezing. Skin:  no rash or lesions. No jaundice GU:  no dysuria, change in color of urine, no urgency or frequency. No straining to urinate.  No flank pain.  Musculoskeletal:  No joint pain or no joint swelling. No decreased range of motion. No back pain.  Psych:  No change in mood or affect. No depression or anxiety. No memory loss.  Neuro: no localizing neurological complaints, no tingling, no weakness, no double vision, no gait abnormality, no slurred speech, no confusion  All systems reviewed and apart from [place] all are negative _______________________________________________________________________________________________ Past Medical History:   Past Medical History:  Diagnosis Date   AAA (abdominal aortic aneurysm) ([place])    Acute respiratory failure ([place]) 11/23/2019   Asthma    Basal cell carcinoma    CHF (congestive heart failure) (HCC)    Chronic respiratory failure (HCC)    COPD (chronic obstructive pulmonary disease) (HCC)    Coronary artery disease    Diverticulitis    Dysrhythmia    afib   History of chronic respiratory failure    Hyperlipidemia    Hypothyroidism    Moderate aortic stenosis    Myocardial infarction ([place])    Pneumonia    Pulmonary HTN ([place])    Right heart failure ([place])    Skin cancer 2017   bladder    Sleep apnea    Bipap      Past Surgical History:  Procedure Laterality Date   bleeding ulcer  2016   BYPASS AXILLA/BRACHIAL ARTERY  1998   colon bleed  2012   COLONOSCOPY N/A 06/07/2017   Procedure: COLONOSCOPY;  Surgeon: Carol Ada, MD;  Location: [facility];  Service: Endoscopy;  Laterality: N/A;   CORONARY ARTERY BYPASS GRAFT     5 vessel   DEBRIDMENT OF DECUBITUS ULCER  2002   EYE SURGERY     cataract   IR ANGIOGRAM SELECTIVE EACH ADDITIONAL VESSEL  03/22/2020   IR ANGIOGRAM VISCERAL SELECTIVE  03/22/2020   IR  EXCHANGE BILIARY DRAIN  12/02/2020   IR EXCHANGE BILIARY DRAIN  02/08/2021   IR EXCHANGE BILIARY DRAIN  04/05/2021   IR EXCHANGE BILIARY DRAIN  06/05/2021   IR PERC CHOLECYSTOSTOMY  09/08/2020   IR RADIOLOGIST EVAL & MGMT  10/06/2020   IR RADIOLOGIST EVAL & MGMT  10/26/2020   IR US GUIDE VASC ACCESS RIGHT  03/22/2020   RIGHT HEART CATH N/A 12/11/2017   Procedure: RIGHT HEART CATH;  Surgeon: Jolaine Artist, MD;  Location: [facility];  Service: Cardiovascular;  Laterality: N/A;   THORACIC AORTA STENT  2009   TRANSURETHRAL RESECTION OF BLADDER TUMOR WITH MITOMYCIN-C N/A 03/09/2021   Procedure: TRANSURETHRAL RESECTION OF BLADDER TUMOR;  Surgeon: Franchot Gallo, MD;  Location: [facility];  Service: Urology;  Laterality: N/A;    Social History:  Ambulatory   independently      reports that he quit smoking about 59 years ago. He smoked an average of .5 packs per day. He has never used smokeless tobacco. He reports that he does not currently use alcohol. He reports that he does not use drugs.   Family History:   Family History  Problem Relation Age of Onset   Emphysema Mother    Stroke Father    Movement disorder Sister    Aneurysm Brother        Thoracic aorta   Macular degeneration Brother    ______________________________________________________________________________________________ Allergies: No Known Allergies   Prior to Admission medications   Medication Sig Start Date End Date Taking? Authorizing Provider  albuterol (VENTOLIN HFA) 108 (90 Base) MCG/ACT inhaler Inhale 1 puff into the lungs every 6 (six) hours as needed for wheezing or shortness of breath.   Yes [provider]  ferrous sulfate 325 (65 FE) MG tablet Take 325 mg by mouth daily with breakfast.   Yes [provider]  levothyroxine (SYNTHROID) 88 MCG tablet Take 88 mcg by mouth daily before breakfast.   Yes [provider]  Mometasone Furo-Formoterol Fum (DULERA IN) Inhale 1 puff  into the lungs 2 (two) times daily as needed (shortness of breath).   Yes [provider]  montelukast (SINGULAIR) 10 MG tablet Take 10 mg by mouth at bedtime.   Yes [provider]  pantoprazole (PROTONIX) 20 MG tablet Take 20 mg by mouth every morning.   Yes [provider]  potassium chloride SA (KLOR-CON) 20 MEQ tablet Take 20 mEq by mouth in the morning and at bedtime. 09/27/20  Yes [provider]  sildenafil (REVATIO) 20 MG tablet TAKE TWO TABLETS BY MOUTH THREE TIMES A DAY Patient taking differently: Take 40 mg by mouth 3 (three) times daily. For pulmonary hypertension 11/02/20  Yes Rosita Fire, Brittainy M, PA-C  simvastatin (ZOCOR) 10 MG tablet Take 10 mg by mouth at bedtime.   Yes [provider]  torsemide (DEMADEX) 20 MG tablet TAKE TWO TABLETS BY MOUTH DAILY Patient taking differently: Take 40 mg by mouth every morning. 01/11/21  Yes Bensimhon, Shaune Pascal, MD  Vitamin D, Ergocalciferol, (DRISDOL) 1.25 MG (50000 UNIT) CAPS capsule Take 50,000 Units by mouth every Sunday. 05/26/21  Yes [provider]  cephALEXin (KEFLEX) 250 MG capsule Take 1 capsule (250 mg total) by mouth every 12 (twelve) hours. 03/10/21   Franchot Gallo, MD  doxycycline (VIBRA-TABS) 100 MG tablet Take 1 tablet (100 mg total) by mouth 2 (two) times daily. 05/05/21   Chesley Mires, MD  fluticasone (FLONASE) 50 MCG/ACT nasal spray Place 1 spray into both nostrils daily as needed for allergies or rhinitis.    [provider]  Fluticasone-Salmeterol,sensor, 113-14 MCG/ACT AEPB Inhale 1 puff into the lungs 2 (two) times daily. 09/29/20   [name], PA-C  HYDROcodone-acetaminophen (NORCO/VICODIN) 5-325 MG tablet Take 1 tablet by mouth every 6 (six) hours as needed for moderate pain. 04/27/21   Orpah Greek, MD  loratadine (CLARITIN) 10 MG tablet Take 10 mg by mouth daily as needed for allergies.    [provider]  macitentan (OPSUMIT) 10 MG  tablet Take 1 tablet (10 mg total) by mouth daily. Needs appt for future refills 08/19/20   Bensimhon, Shaune Pascal, MD  midodrine (PROAMATINE) 10 MG tablet Take 1 tablet (10 mg total) by mouth 3 (three) times daily  after meals. In addition with the '5mg'$  tablets 12/29/20   Bensimhon, Shaune Pascal, MD  midodrine (PROAMATINE) 5 MG tablet Take 1 tablet (5 mg total) by mouth 3 (three) times daily with meals. In addition with the '10mg'$  tablets 12/29/20   Bensimhon, Shaune Pascal, MD  predniSONE (DELTASONE) 20 MG tablet Take 2 tablets (40 mg total) by mouth daily with breakfast. 03/27/21   Mannam, Hart Robinsons, MD  Sodium Chloride Flush (NORMAL SALINE FLUSH) 0.9 % SOLN USE 5 MLS 2 TIMES DAILY AS DIRECTED 09/22/20 09/22/21  Clovis Riley, MD    ___________________________________________________________________________________________________ Physical Exam: Vitals with BMI 07/08/2021 07/08/2021 07/08/2021  Height - '5\' 7"'$  -  Weight - 154 lbs -  BMI - [id number] -  Systolic [id number] - [id number]  Diastolic 73 - 61  Pulse [id number] - 94     1. General:  in No  Acute distress    Chronically ill  -appearing 2. Psychological: Alert and   Oriented 3. Head/ENT:   Moist   Mucous Membranes                          Head Non traumatic, neck supple                         Poor Dentition 4. SKIN:  decreased Skin turgor,  Skin clean Dry and intact no rash 5. Heart: Regular rate and rhythm no  Murmur, no Rub or gallop 6. Lungs some  wheezes or crackles   7. Abdomen: Soft, non-tender, Non distended   obese   8. Lower extremities: no clubbing, cyanosis, 2+ edema 9. Neurologically Grossly intact, moving all 4 extremities equally   10. MSK: Normal range of motion    Chart has been reviewed  ______________________________________________________________________________________________  Assessment/Plan 87 y.o. male with medical history significant of  COPD, CHF, and wears 3L of O2   Admitted for CHF exacerbation and dysphagia possible esophageal  impaction  Present on Admission:  Acute on chronic diastolic CHF (congestive heart failure) ([place])- - Pt diagnosed with CHF based on presence of the following:  rales on exam,    cardiomegaly, Pulmonary edema on CXR, and   bilateral leg edema, DOE,  pleural effusion  With noted response to IV diuretic in ER  admit on telemetry,  cycle cardiac enzymes, Troponin   obtain serial ECG  to evaluate for ischemia as a cause of heart failure  monitor daily weight:  Filed Weights   07/08/21 1949  Weight: 69.9 kg   Last BNP BNP (last 3 results) Recent Labs    11/13/20 2234 12/29/20 1202 07/08/21 2100  BNP 299.6* 241.7* 444.5*      diurese with IV lasix and monitor orthostatics and creatinine to avoid over diuresis.  Order echogram to evaluate EF and valves  ACE/ARBi   Contraindicated due to hypotension     Pulmonary hypertension, unspecified ([place]) unable to give home medications secondary to dysphagia.  Hold off and resume if able to swallow.   ILD (interstitial lung disease) ([place]) chronic long-term.  Currently could be contributing to shortness of breath.  Possible aspiration pneumonia patient is scribes green sputum cough, chills, worsening shortness of breath acute on chronic respiratory failure and increase coughing after eating.  Chest x-ray showing bilateral edema Imaging lags behind.  Will for now will cover with Unasyn and continue to monitor obtain sputum cultures   Hypothyroidism -chronic stable resume home medications when able to.  Check TSH    Hyperlipidemia chronic stable resume home medications when able to.    Coronary artery disease  Chronic stable resume statins when able to tolerate patient not on beta-blocker secondary to severe COPD  Dysphagia [facility] has been consulted plan for perform EGD in a.m.  Continue Protonix, n.p.o. postmidnight Also obtain speech pathology evaluation for any evidence of aspiration   COPD (chronic obstructive pulmonary disease) (HCC)   No wheezing continue home inhalers.  And monitor   Atrial fibrillation ([place]) at baseline not on home anticoagulation.  Or beta-blocker.  We will continue to monitor   Acute on chronic respiratory failure (HCC) patient baseline 3 L of oxygen currently requiring up to 7 L.  this patient has acute respiratory failure with Hypoxia as documented by the presence of following: O2 saturatio< 90% on 3L Likely due [id number], CHF exacerbation, Provide O2 therapy and titrate as needed  Continuous pulse ox   check Pulse ox with ambulation prior to discharge flutter valve ordered  Hypotension could not tolerate midodrine at this time we will try to resume as soon as able.  Will avoid IV fluid as patient appears to be fluid up.   Prolonged QT interval - - will monitor on tele avoid QT prolonging medications, rehydrate correct electrolytes    Other plan as per orders.  DVT prophylaxis:  SCD       Code Status:    Code Status: Prior  DNI DO NOT INTUBATE but still wants CPR as per patient  family  I had personally discussed CODE STATUS with patient and family     Family Communication:   Family not at  Bedside  plan of care was discussed   with  Daughter, Wife, Husband, Sister, Brother , father, mother  Disposition Plan:    To home once workup is complete and patient is stable   Following barriers for discharge:                                                       Will need to be able to tolerate PO                            Will likely need home health, home O2, set up                           Will need consultants to evaluate patient prior to discharge                 Would benefit from PT/OT eval prior to DC  Ordered                                        Consults called: [facility]  Admission status:  ED Disposition     ED Disposition  [place]: [place] [[id number]]  Level of Care: Progressive [102]  Admit to  Progressive based on following criteria: CARDIOVASCULAR & THORACIC of moderate stability with acute coronary syndrome symptoms/low risk myocardial infarction/hypertensive urgency/arrhythmias/heart failure potentially compromising stability and stable post cardiovascular intervention patients.  May  admit patient to [facility] or [facility] if equivalent level of care is available:: No  Covid Evaluation: Confirmed COVID Negative  [facility]: [id number]  Admitting Physician: Toy Baker [[id number]]  Attending Physician: Toy Baker [[id number]]  Estimated length of stay: past midnight tomorrow  Certification:: I certify this patient will need inpatient services for at least 2 midnights              inpatient     I Expect 2 midnight stay secondary to severity of patient's current illness need for inpatient interventions justified by the following:    Severe lab/radiological/exam abnormalities including:    Pulmonary edema and extensive comorbidities including: COPD/CHF    That are currently affecting medical management.   I expect  patient to be hospitalized for 2 midnights requiring inpatient medical care.  Patient is at high risk for adverse outcome (such as loss of life or disability) if not treated.  Indication for inpatient stay as follows:    inability to maintain oral hydration   persistent chest pain despite medical management Need for operative/procedural  intervention New or worsening hypoxia  Need for IV diuretics    Level of care   progressive  tele indefinitely please discontinue once patient no longer qualifies COVID-19 Labs    Lab Results  Component Value Date   [place] 07/08/2021     Precautions: admitted as  Covid Negative    PPE: Used by the provider:   N95  eye Goggles,  Gloves    Rose-Marie Hickling 07/09/2021, 1:19 AM    [facility]     after 2 AM please page floor coverage PA If 7AM-7PM,  please contact the day team taking care of the patient using [email or web address]   Patient was evaluated in the context of the global COVID-19 pandemic, which necessitated consideration that the patient might be at risk for infection with the SARS-CoV-2 virus that causes COVID-19. Institutional protocols and algorithms that pertain to the evaluation of patients at risk for COVID-19 are in a state of rapid change based on information released by regulatory bodies including the CDC and federal and state organizations. These policies and algorithms were followed during the patient's care.

## 2021-07-08 NOTE — ED Triage Notes (Signed)
Pt came in with c/o SOB worsening for the last few month. Pt states that he has COPD, CHF, and wears 3L of O2 at home regularly. On normal 3L pt was at 81%  on arrival. Pt alert and oriented.

## 2021-07-09 ENCOUNTER — Encounter (HOSPITAL_COMMUNITY): Payer: Self-pay | Admitting: Internal Medicine

## 2021-07-09 ENCOUNTER — Inpatient Hospital Stay (HOSPITAL_COMMUNITY): Payer: Medicare Other

## 2021-07-09 ENCOUNTER — Encounter (HOSPITAL_COMMUNITY)
Admission: EM | Disposition: A | Discharge status: Home Health | Payer: Self-pay | Source: Home / Self Care | Attending: Internal Medicine

## 2021-07-09 ENCOUNTER — Inpatient Hospital Stay (HOSPITAL_COMMUNITY): Payer: Medicare Other | Admitting: Certified Registered Nurse Anesthetist

## 2021-07-09 DIAGNOSIS — I5031 Acute diastolic (congestive) heart failure: Secondary | ICD-10-CM | POA: Diagnosis not present

## 2021-07-09 DIAGNOSIS — R9431 Abnormal electrocardiogram [ECG] [EKG]: Secondary | ICD-10-CM | POA: Diagnosis present

## 2021-07-09 LAB — CBC WITH DIFFERENTIAL/PLATELET
Abs Immature Granulocytes: 0.07 10*3/uL (ref 0.00–0.07)
Basophils Absolute: 0 10*3/uL (ref 0.0–0.1)
Basophils Relative: 0 %
Eosinophils Absolute: 0 10*3/uL (ref 0.0–0.5)
Eosinophils Relative: 0 %
HCT: 40.4 % (ref 39.0–52.0)
Hemoglobin: 12.7 g/dL — ABNORMAL LOW (ref 13.0–17.0)
Immature Granulocytes: 1 %
Lymphocytes Relative: 8 %
Lymphs Abs: 0.9 10*3/uL (ref 0.7–4.0)
MCH: 26.7 pg (ref 26.0–34.0)
MCHC: 31.4 g/dL (ref 30.0–36.0)
MCV: 84.9 fL (ref 80.0–100.0)
Monocytes Absolute: 0.1 10*3/uL (ref 0.1–1.0)
Monocytes Relative: 1 %
Neutro Abs: 10.9 10*3/uL — ABNORMAL HIGH (ref 1.7–7.7)
Neutrophils Relative %: 90 %
Platelets: 294 10*3/uL (ref 150–400)
RBC: 4.76 MIL/uL (ref 4.22–5.81)
WBC: 12 10*3/uL — ABNORMAL HIGH (ref 4.0–10.5)
nRBC: 0 % (ref 0.0–0.2)

## 2021-07-09 LAB — COMPREHENSIVE METABOLIC PANEL
ALT: 18 U/L (ref 0–44)
AST: 29 U/L (ref 15–41)
Albumin: 3.9 g/dL (ref 3.5–5.0)
Alkaline Phosphatase: 89 U/L (ref 38–126)
Anion gap: 14 (ref 5–15)
BUN: 22 mg/dL (ref 8–23)
CO2: 26 mmol/L (ref 22–32)
Calcium: 8.6 mg/dL — ABNORMAL LOW (ref 8.9–10.3)
Chloride: 97 mmol/L — ABNORMAL LOW (ref 98–111)
Creatinine, Ser: 1.02 mg/dL (ref 0.61–1.24)
GFR, Estimated: >60 mL/min (ref >60)
Glucose, Bld: 141 mg/dL — ABNORMAL HIGH (ref 70–99)
Potassium: 3.5 mmol/L (ref 3.5–5.1)
Sodium: 137 mmol/L (ref 135–145)
Total Bilirubin: 1.2 mg/dL (ref 0.3–1.2)
Total Protein: 8.2 g/dL — ABNORMAL HIGH (ref 6.5–8.1)

## 2021-07-09 LAB — ECHOCARDIOGRAM COMPLETE
AR max vel: 0.95 cm2
AV Area VTI: 1.11 cm2
AV Area mean vel: 0.82 cm2
AV Mean grad: 30.3 mmHg
AV Peak grad: 52.5 mmHg
Ao pk vel: 3.62 m/s
Height: 67 in
S' Lateral: 2.7 cm
Weight: 2464 oz

## 2021-07-09 LAB — PHOSPHORUS: Phosphorus: 4.2 mg/dL (ref 2.5–4.6)

## 2021-07-09 LAB — TROPONIN I (HIGH SENSITIVITY)
Troponin I (High Sensitivity): 24 ng/L — ABNORMAL HIGH (ref <18)
Troponin I (High Sensitivity): 21 ng/L — ABNORMAL HIGH (ref <18)
Troponin I (High Sensitivity): 24 ng/L — ABNORMAL HIGH (ref <18)

## 2021-07-09 LAB — URINALYSIS, ROUTINE W REFLEX MICROSCOPIC
Bacteria, UA: NONE SEEN
Bilirubin Urine: NEGATIVE
Glucose, UA: NEGATIVE mg/dL
Hgb urine dipstick: NEGATIVE
Ketones, ur: NEGATIVE mg/dL
Leukocytes,Ua: NEGATIVE
Nitrite: NEGATIVE
Protein, ur: NEGATIVE mg/dL
Specific Gravity, Urine: 1.01 (ref 1.005–1.030)
pH: 7 (ref 5.0–8.0)

## 2021-07-09 LAB — MAGNESIUM: Magnesium: 2.2 mg/dL (ref 1.7–2.4)

## 2021-07-09 LAB — TSH: TSH: 1.73 u[IU]/mL (ref 0.350–4.500)

## 2021-07-09 LAB — STREP PNEUMONIAE URINARY ANTIGEN: Strep Pneumo Urinary Antigen: NEGATIVE

## 2021-07-09 SURGERY — CANCELLED PROCEDURE

## 2021-07-09 MED ORDER — SIMVASTATIN 20 MG PO TABS
10.0000 mg | ORAL_TABLET | Freq: Every day | ORAL | Status: DC
  Administered 2021-07-09 – 2021-07-10 (×2): 10 mg via ORAL
  Filled 2021-07-09 (×2): qty 1

## 2021-07-09 MED ORDER — MACITENTAN 10 MG PO TABS
10.0000 mg | ORAL_TABLET | Freq: Every day | ORAL | Status: DC
  Administered 2021-07-09 – 2021-07-11 (×3): 10 mg via ORAL
  Filled 2021-07-09 (×4): qty 1

## 2021-07-09 MED ORDER — SODIUM CHLORIDE 0.9% FLUSH: 3.0000 mL | INTRAVENOUS | Status: DC | PRN

## 2021-07-09 MED ORDER — MIDODRINE HCL 5 MG PO TABS
10.0000 mg | ORAL_TABLET | Freq: Three times a day (TID) | ORAL | Status: DC
  Administered 2021-07-09 – 2021-07-11 (×7): 10 mg via ORAL
  Filled 2021-07-09 (×7): qty 2

## 2021-07-09 MED ORDER — MOMETASONE FURO-FORMOTEROL FUM 100-5 MCG/ACT IN AERO
2.0000 | INHALATION_SPRAY | Freq: Two times a day (BID) | RESPIRATORY_TRACT | Status: DC
  Administered 2021-07-09 – 2021-07-11 (×5): 2 via RESPIRATORY_TRACT
  Filled 2021-07-09: qty 8.8

## 2021-07-09 MED ORDER — FUROSEMIDE 10 MG/ML IJ SOLN
40.0000 mg | Freq: Two times a day (BID) | INTRAMUSCULAR | Status: DC
  Administered 2021-07-09 – 2021-07-10 (×2): 40 mg via INTRAVENOUS
  Filled 2021-07-09 (×2): qty 4

## 2021-07-09 MED ORDER — PERFLUTREN LIPID MICROSPHERE
1.0000 mL | INTRAVENOUS | Status: AC | PRN
  Administered 2021-07-09: 2 mL via INTRAVENOUS
  Filled 2021-07-09: qty 10

## 2021-07-09 MED ORDER — SILDENAFIL CITRATE 20 MG PO TABS
40.0000 mg | ORAL_TABLET | Freq: Three times a day (TID) | ORAL | Status: DC
  Administered 2021-07-09 (×2): 40 mg via ORAL
  Filled 2021-07-09 (×8): qty 2

## 2021-07-09 MED ORDER — LEVOTHYROXINE SODIUM 88 MCG PO TABS
88.0000 ug | ORAL_TABLET | Freq: Every day | ORAL | Status: DC
  Administered 2021-07-09 – 2021-07-11 (×3): 88 ug via ORAL
  Filled 2021-07-09 (×3): qty 1

## 2021-07-09 MED ORDER — ACETAMINOPHEN 325 MG PO TABS: 650.0000 mg | ORAL_TABLET | Freq: Four times a day (QID) | ORAL | Status: DC | PRN

## 2021-07-09 MED ORDER — ALBUTEROL SULFATE (2.5 MG/3ML) 0.083% IN NEBU: 2.5000 mg | INHALATION_SOLUTION | Freq: Four times a day (QID) | RESPIRATORY_TRACT | Status: DC | PRN

## 2021-07-09 MED ORDER — MONTELUKAST SODIUM 10 MG PO TABS
10.0000 mg | ORAL_TABLET | Freq: Every day | ORAL | Status: DC
  Administered 2021-07-09 – 2021-07-10 (×2): 10 mg via ORAL
  Filled 2021-07-09 (×2): qty 1

## 2021-07-09 MED ORDER — PANTOPRAZOLE SODIUM 40 MG PO TBEC: 40.0000 mg | DELAYED_RELEASE_TABLET | Freq: Every day | ORAL | Status: DC

## 2021-07-09 MED ORDER — SODIUM CHLORIDE 0.9 % IV SOLN
3.0000 g | Freq: Three times a day (TID) | INTRAVENOUS | Status: DC
  Administered 2021-07-09: 3 g via INTRAVENOUS
  Filled 2021-07-09: qty 3

## 2021-07-09 MED ORDER — SODIUM CHLORIDE 0.9 % IV SOLN
250.0000 mL | INTRAVENOUS | Status: DC | PRN
  Administered 2021-07-09: 250 mL via INTRAVENOUS

## 2021-07-09 MED ORDER — ORAL CARE MOUTH RINSE
15.0000 mL | Freq: Two times a day (BID) | OROMUCOSAL | Status: DC
  Administered 2021-07-09 – 2021-07-11 (×5): 15 mL via OROMUCOSAL

## 2021-07-09 MED ORDER — ACETAMINOPHEN 650 MG RE SUPP: 650.0000 mg | Freq: Four times a day (QID) | RECTAL | Status: DC | PRN

## 2021-07-09 MED ORDER — MACITENTAN 10 MG PO TABS: 10.0000 mg | ORAL_TABLET | Freq: Every day | ORAL | Status: DC

## 2021-07-09 MED ORDER — SODIUM CHLORIDE 0.9% FLUSH
3.0000 mL | Freq: Two times a day (BID) | INTRAVENOUS | Status: DC
  Administered 2021-07-09 – 2021-07-11 (×6): 3 mL via INTRAVENOUS

## 2021-07-09 MED ORDER — PANTOPRAZOLE SODIUM 40 MG IV SOLR
40.0000 mg | Freq: Two times a day (BID) | INTRAVENOUS | Status: DC
  Administered 2021-07-09 – 2021-07-10 (×2): 40 mg via INTRAVENOUS
  Filled 2021-07-09 (×2): qty 40

## 2021-07-09 MED ORDER — ALBUTEROL SULFATE HFA 108 (90 BASE) MCG/ACT IN AERS: 1.0000 | INHALATION_SPRAY | Freq: Four times a day (QID) | RESPIRATORY_TRACT | Status: DC | PRN

## 2021-07-09 MED ORDER — FERROUS SULFATE 325 (65 FE) MG PO TABS
325.0000 mg | ORAL_TABLET | Freq: Every day | ORAL | Status: DC
  Filled 2021-07-09 (×2): qty 1

## 2021-07-09 SURGICAL SUPPLY — 14 items

## 2021-07-09 NOTE — Progress Notes (Signed)
Pharmacy Consult for Pulmonary Hypertension Treatment   Indication - Continuation of prior to admission medication   Patient is 87 y.o.  with history of PAH on chronic Macitentan (Opsumit) PTA and will be continued while hospitalized.   Continuing this medication order as an inpatient requires that monitoring parameters per REMS requirements must be met.  Chronic therapy is under the supervision of Dr Haroldine Laws who is enrolled in the REMS program and is being notified of continuation of therapy. A staff message in EPIC has been sent notifying the certified prescriber.  Patient has previously been educated. On admission pregnancy risk has been assessed and no monitoring required. Hepatic function has been evaluated. AST/ALT appropriate to continue medication at this time.  Hepatic Function Latest Ref Rng & Units 07/09/2021 07/08/2021 03/09/2021  Total Protein 6.5 - 8.1 g/dL 8.2(H) 8.8(H) 6.7  Albumin 3.5 - 5.0 g/dL 3.9 4.2 3.1(L)  AST 15 - 41 U/L '29 28 21  ' ALT 0 - 44 U/L '18 17 14  ' Alk Phosphatase 38 - 126 U/L 89 91 56  Total Bilirubin 0.3 - 1.2 mg/dL 1.2 1.0 0.7  Bilirubin, Direct 0.0 - 0.2 mg/dL - - 0.2    If any question arise or pregnancy is identified during hospitalization, contact for bosentan and macitentan: [phone]; ambrisentan: [phone].  Thank for you allowing [place] to participate in the care of this patient. Gretta Arab PharmD, BCPS Clinical Pharmacist [facility] main pharmacy [phone] 07/09/2021 11:13 AM

## 2021-07-09 NOTE — Progress Notes (Addendum)
PROGRESS NOTE    Vincent Robinson  [id number] DOB: [birth year] DOA: 07/08/2021 PCP: Katherina Mires, MD    Brief Narrative:  Vincent Robinson was admitted to the hospital with the working diagnosis of acute decompensation of heart failure.   87 year old male coming from assisted living facility, with a  past medical history for COPD, chronic hypoxic respiratory failure and heart failure who presented with worsening dyspnea.  Positive dry cough with worsening lower extremity edema but no fevers or for chills.  At home his oxygenation was 81% on 3 L of supplemental oxygen per nasal cannula, prompting him to come to the hospital.  On his initial physical examination blood pressure 130/61, heart rate 94-103, respirate 21-24, temperature 98.9, oxygen saturation 90% on supplemental oxygen, his lungs had wheezing, heart S1-S2, present, rhythmic, abdomen soft, positive pitting lower extremity edema.  Sodium 138, potassium 3.5, chloride 98, bicarb 28, glucose 95, BUN 22, creatinine 1.0, BNP 444.5, high sensitive troponin 24-21-24, white count 11.0, hemoglobin 8.7, hematocrit 38.1, platelets 285. SARS COVID-19 negative.  Urine analysis with SG 1,010.   Chest radiograph with cardiomegaly, increased interstitial markings bilateral upper lobes and positive hilar vascular congestion.  EKG 108 bpm, normal axis, QTC 518, right bundle branch block, atrial rhythm with PVCs, Q-wave in V1, no significant ST segment or T wave changes.  Assessment & Plan:   Principal Problem:   Acute on chronic diastolic CHF (congestive heart failure) (HCC) Active Problems:   COPD (chronic obstructive pulmonary disease) (HCC)   ILD (interstitial lung disease) (HCC)   Hypothyroidism   Hyperlipidemia   Coronary artery disease   Pulmonary hypertension, unspecified (HCC)   Atrial fibrillation (HCC)   Acute on chronic respiratory failure (HCC)   CHF exacerbation (HCC)   Esophageal obstruction due to food impaction   Prolonged QT  interval   Acute on chronic diastolic heart failure exacerbation, acute hypoxemic respiratory failure due to acute cardiogenic pulmonary edema. Patient's symptoms have improved but not yet back to baseline, his oxygenation is 87% on 6 L/min per [place], he continue to have JVD and lower extremity edema. Systolic blood pressure 91 to 126 mmHg.   Plan to continue diuresis with IV furosemide, increase dose to 40 mg IV bid to target further negative fluid balance. Strict in and out monitoring. Blood pressure monitoring. Follow up with echocardiography. (In 08/2020 EF LV 60 to 65%, severe pulmonary hypertension)  Out of bed to chair to chair tid with meals and PT/OT evaluation.  No clinical signs of pneumonia, will discontinue antibiotic therapy (pneumonia ruled out)  2. Pulmonary hypertension/ ILD. Continue supplemental 02 per [place], to keep 02 saturation 92% or greater. Continue macitetan and sildenafil.    3. Atrial fibrillation/ prolonged QTc Continue rate control with metoprolol. At home not on anticoagulation.  Keep K at 4 and Mg at 2.   4. HTN/ CAD/ dyslipidemia. Continue blood pressure control with metoprolol. Continue with statin therapy.   5. COPD. No clinical signs of exacerbation, continue with as needed bronchodilator therapy. Inhaled corticosteroids.   6. Dysphagia was resolved. Will continue pantoprazole and will advance diet to clears.  Patient with acute hypoxemic respiratory failure due to CHF exacerbation, will hold on endoscopic procedures for now.  Follow with swallow evaluation.   7. Hypothyroid. Continue with levothyroxine.   8. Anemia of iron deficiency. Continue iron supplementation.   Patient continue to be at high risk for worsening heart failure and hypoxemic respiratory failure   Status is: Inpatient  Remains inpatient  appropriate because:Inpatient level of care appropriate due to severity of illness  Dispo: The patient is from: ALF              Anticipated  d/c is to: ALF              Patient currently is not medically stable to d/c.   Difficult to place patient No  DVT prophylaxis: Enoxaparin   Code Status:    Partial no mechanical ventilation   Family Communication:  I spoke over the phone with the patient's daughter about patient's  condition, plan of care, prognosis and all questions were addressed.     Subjective: Patient with further dysphagia, no nausea or vomiting, dyspnea and lower extremity edema still present, no chest pain,   Objective: Vitals:   07/09/21 0100 07/09/21 0233 07/09/21 0316 07/09/21 0802  BP: 91/61 126/69    Pulse: 74 95    Resp: 14 (!) 22    Temp: 98.7 F (37.1 C) 98.1 F (36.7 C)    TempSrc: Oral     SpO2: 100% 92% (!) 87% (!) 87%  Weight:      Height:        Intake/Output Summary (Last 24 hours) at 07/09/2021 0900 Last data filed at 07/09/2021 0831 Gross per 24 hour  Intake 100 ml  Output 450 ml  Net -350 ml   Filed Weights   07/08/21 1949  Weight: 69.9 kg    Examination:   General: Not in pain or dyspnea, deconditioned  Neurology: Awake and alert, non focal  E ENT: no pallor, no icterus, oral mucosa moist Cardiovascular: positive JVD. S1-S2 present, rhythmic, no gallops, rubs, positive murmur 3/6 at the apex systolic. ++ pitting bilateral lower extremity edema. Pulmonary: positive breath sounds bilaterally, with no wheezing, but scattered rhonchi and rales. Gastrointestinal. Abdomen soft and non tender Skin. No rashes Musculoskeletal: no joint deformities    Data Reviewed: I have personally reviewed following labs and imaging studies  CBC: Recent Labs  Lab 07/08/21 2100 07/09/21 0252  WBC 11.0* 12.0*  NEUTROABS 8.2* 10.9*  HGB 11.7* 12.7*  HCT 38.1* 40.4  MCV 86.6 84.9  PLT 285 [id number]   Basic Metabolic Panel: Recent Labs  Lab 07/08/21 2100 07/09/21 0252  NA 138 137  K 3.5 3.5  CL 98 97*  CO2 28 26  GLUCOSE 95 141*  BUN 22 22  CREATININE 1.04 1.02  CALCIUM 8.9 8.6*   MG  --  2.2  PHOS  --  4.2   GFR: Estimated Creatinine Clearance: 39.6 mL/min (by C-G formula based on SCr of 1.02 mg/dL). Liver Function Tests: Recent Labs  Lab 07/08/21 2100 07/09/21 0252  AST 28 29  ALT 17 18  ALKPHOS 91 89  BILITOT 1.0 1.2  PROT 8.8* 8.2*  ALBUMIN 4.2 3.9   No results for input(s): LIPASE, AMYLASE in the last 168 hours. No results for input(s): AMMONIA in the last 168 hours. Coagulation Profile: No results for input(s): INR, PROTIME in the last 168 hours. Cardiac Enzymes: No results for input(s): CKTOTAL, CKMB, CKMBINDEX, TROPONINI in the last 168 hours. BNP (last 3 results) No results for input(s): PROBNP in the last 8760 hours. HbA1C: No results for input(s): HGBA1C in the last 72 hours. CBG: No results for input(s): GLUCAP in the last 168 hours. Lipid Profile: No results for input(s): CHOL, HDL, LDLCALC, TRIG, CHOLHDL, LDLDIRECT in the last 72 hours. Thyroid Function Tests: Recent Labs    07/09/21 0252  TSH 1.730  Anemia Panel: No results for input(s): VITAMINB12, FOLATE, FERRITIN, TIBC, IRON, RETICCTPCT in the last 72 hours.    Radiology Studies: I have reviewed all of the imaging during this hospital visit personally     Scheduled Meds:  furosemide  20 mg Intravenous Q12H   mouth rinse  15 mL Mouth Rinse BID   mometasone-formoterol  2 puff Inhalation BID   pantoprazole (PROTONIX) IV  40 mg Intravenous Q12H   sodium chloride flush  3 mL Intravenous Q12H   Continuous Infusions:  sodium chloride     ampicillin-sulbactam (UNASYN) IV       LOS: 1 day        Tamina Cyphers Gerome Apley, MD

## 2021-07-09 NOTE — Progress Notes (Signed)
/  Pharmacy Antibiotic Note  Vincent Robinson is a 87 y.o. male admitted on 07/08/2021 with concern for aspiration pneumonia.  Pharmacy has been consulted for Unasyn dosing. Renal function at patient's baseline with CrCl>48m/min  Plan: Unasyn 3gm IV q8h No dose adjustments anticipated.  Pharmacy will sign off and monitor peripherally via electronic surveillance software for any changes in renal function or micro data.  Height: '5\' 7"'$  (170.2 cm) Weight: 69.9 kg (154 lb) IBW/kg (Calculated) : 66.1  Temp (24hrs), Avg:98.9 F (37.2 C), Min:98.9 F (37.2 C), Max:98.9 F (37.2 C)  Recent Labs  Lab 07/08/21 2100  WBC 11.0*  CREATININE 1.04    Estimated Creatinine Clearance: 38.8 mL/min (by C-G formula based on SCr of 1.04 mg/dL).    Allergies  Allergen Reactions   Hydrocodone Other (See Comments)    Antimicrobials this admission: 9/18 Unasyn >>   Dose adjustments this admission:  Microbiology results: Sputum:    Thank you for allowing pharmacy to be a part of this patient's care.  MNetta CedarsPharmD 07/09/2021 12:16 AM

## 2021-07-09 NOTE — ED Notes (Signed)
ED TO INPATIENT HANDOFF REPORT  ED Nurse Name and Phone #: Erick Colace, RN   S Name/Age/Gender Vincent Robinson 87 y.o. male Room/Bed: WA03/WA03  Code Status   Code Status: Prior  Home/SNF/Other Home Patient oriented to: self, place, time, and situation Is this baseline? Yes   Triage Complete: Triage complete  Chief Complaint CHF exacerbation ([place]) [I50.9]  Triage Note Pt came in with c/o SOB worsening for the last few month. Pt states that he has COPD, CHF, and wears 3L of O2 at home regularly. On normal 3L pt was at 81%  on arrival. Pt alert and oriented.    Allergies Allergies  Allergen Reactions   Hydrocodone Other (See Comments)    Level of Care/Admitting Diagnosis ED Disposition     ED Disposition  Admit   Condition  --   Comment  Hospital Area: [facility] [[id number]]  Level of Care: Progressive [102]  Admit to Progressive based on following criteria: CARDIOVASCULAR & THORACIC of moderate stability with acute coronary syndrome symptoms/low risk myocardial infarction/hypertensive urgency/arrhythmias/heart failure potentially compromising stability and stable post cardiovascular intervention patients.  May admit patient to [facility] or [facility] if equivalent level of care is available:: No  Covid Evaluation: Confirmed COVID Negative  [facility]: [id number]  Admitting Physician: Toy Baker [[id number]]  Attending Physician: Toy Baker [[id number]]  Estimated length of stay: past midnight tomorrow  Certification:: I certify this patient will need inpatient services for at least 2 midnights          B Medical/Surgery History Past Medical History:  Diagnosis Date   AAA (abdominal aortic aneurysm) ([place])    Acute respiratory failure ([place]) 11/23/2019   Asthma    Basal cell carcinoma    CHF (congestive heart failure) ([place])    Chronic respiratory failure ([place])    COPD (chronic obstructive pulmonary disease)  ([place])    Coronary artery disease    Diverticulitis    Dysrhythmia    afib   History of chronic respiratory failure    Hyperlipidemia    Hypothyroidism    Moderate aortic stenosis    Myocardial infarction ([place])    Pneumonia    Pulmonary HTN ([place])    Right heart failure ([place])    Skin cancer 2017   bladder    Sleep apnea    Bipap   Past Surgical History:  Procedure Laterality Date   bleeding ulcer  2016   BYPASS AXILLA/BRACHIAL ARTERY  1998   colon bleed  2012   COLONOSCOPY N/A 06/07/2017   Procedure: COLONOSCOPY;  Surgeon: Carol Ada, MD;  Location: [facility];  Service: Endoscopy;  Laterality: N/A;   CORONARY ARTERY BYPASS GRAFT     5 vessel   DEBRIDMENT OF DECUBITUS ULCER  2002   EYE SURGERY     cataract   IR ANGIOGRAM SELECTIVE EACH ADDITIONAL VESSEL  03/22/2020   IR ANGIOGRAM VISCERAL SELECTIVE  03/22/2020   IR EXCHANGE BILIARY DRAIN  12/02/2020   IR EXCHANGE BILIARY DRAIN  02/08/2021   IR EXCHANGE BILIARY DRAIN  04/05/2021   IR EXCHANGE BILIARY DRAIN  06/05/2021   IR PERC CHOLECYSTOSTOMY  09/08/2020   IR RADIOLOGIST EVAL & MGMT  10/06/2020   IR RADIOLOGIST EVAL & MGMT  10/26/2020   IR US GUIDE VASC ACCESS RIGHT  03/22/2020   RIGHT HEART CATH N/A 12/11/2017   Procedure: RIGHT HEART CATH;  Surgeon: Jolaine Artist, MD;  Location: [facility];  Service: Cardiovascular;  Laterality: N/A;   THORACIC AORTA STENT  2009   TRANSURETHRAL RESECTION OF BLADDER TUMOR WITH MITOMYCIN-C N/A 03/09/2021   Procedure: TRANSURETHRAL RESECTION OF BLADDER TUMOR;  Surgeon: Franchot Gallo, MD;  Location: [facility];  Service: Urology;  Laterality: N/A;     A IV Location/Drains/Wounds Patient Lines/Drains/Airways Status     Active Line/Drains/Airways     Name Placement date Placement time Site Days   Peripheral IV 07/08/21 20 G Posterior;Right Forearm 07/08/21  2056  Forearm  1   Biliary Tube Other (Comment) 10.2 Fr. RUQ 04/05/21  1419  RUQ  95   Incision (Closed) 03/09/21 Perineum  Other (Comment) 03/09/21  1111  -- 122   Pressure Injury 09/08/20 Buttocks Left Stage 2 -  Partial thickness loss of dermis presenting as a shallow open injury with a red, pink wound bed without slough. 09/08/20  1000  -- 304            Intake/Output Last 24 hours No intake or output data in the 24 hours ending 07/09/21 0139  Labs/Imaging Results for orders placed or performed during the hospital encounter of 07/08/21 (from the past 48 hour(s))  CBC with Differential/Platelet     Status: Abnormal   Collection Time: 07/08/21  9:00 PM  Result Value Ref Range   WBC 11.0 (H) 4.0 - 10.5 K/uL   RBC 4.40 4.22 - 5.81 MIL/uL   Hemoglobin 11.7 (L) 13.0 - 17.0 g/dL   HCT 38.1 (L) 39.0 - 52.0 %   MCV 86.6 80.0 - 100.0 fL   MCH 26.6 26.0 - 34.0 pg   MCHC 30.7 30.0 - 36.0 g/dL   RDW Not Measured 11.5 - 15.5 %   Platelets 285 150 - 400 K/uL   nRBC 0.0 0.0 - 0.2 %   Neutrophils Relative % 73 %   Neutro Abs 8.2 (H) 1.7 - 7.7 K/uL   Lymphocytes Relative 17 %   Lymphs Abs 1.8 0.7 - 4.0 K/uL   Monocytes Relative 7 %   Monocytes Absolute 0.8 0.1 - 1.0 K/uL   Eosinophils Relative 1 %   Eosinophils Absolute 0.1 0.0 - 0.5 K/uL   Basophils Relative 1 %   Basophils Absolute 0.1 0.0 - 0.1 K/uL   Immature Granulocytes 1 %   Abs Immature Granulocytes 0.05 0.00 - 0.07 K/uL   Reactive, Benign Lymphocytes PRESENT    Target Cells PRESENT     Comment: Performed at [facility], [place], [place] [address], [place], [place] [address]  Comprehensive metabolic panel     Status: Abnormal   Collection Time: 07/08/21  9:00 PM  Result Value Ref Range   Sodium 138 135 - 145 mmol/L   Potassium 3.5 3.5 - 5.1 mmol/L   Chloride 98 98 - 111 mmol/L   CO2 28 22 - 32 mmol/L   Glucose, Bld 95 70 - 99 mg/dL    Comment: Glucose reference range applies only to samples taken after fasting for at least 8 hours.   BUN 22 8 - 23 mg/dL   Creatinine, Ser 1.04 0.61 - 1.24 mg/dL   Calcium 8.9 8.9 - 10.3 mg/dL   Total  Protein 8.8 (H) 6.5 - 8.1 g/dL   Albumin 4.2 3.5 - 5.0 g/dL   AST 28 15 - 41 U/L   ALT 17 0 - 44 U/L   Alkaline Phosphatase 91 38 - 126 U/L   Total Bilirubin 1.0 0.3 - 1.2 mg/dL   GFR, Estimated >60 >60 mL/min  Comment: (NOTE) Calculated using the CKD-EPI Creatinine Equation (2021)    Anion gap 12 5 - 15    Comment: Performed at [facility], [place] [address], [place], [place] [address]  Brain natriuretic peptide     Status: Abnormal   Collection Time: 07/08/21  9:00 PM  Result Value Ref Range   B Natriuretic Peptide 444.5 (H) 0.0 - 100.0 pg/mL    Comment: Performed at [facility], [place] [address] [place], [place] [address]  Resp Panel by RT-PCR (Flu A&B, Covid) Nasopharyngeal Swab     Status: None   Collection Time: 07/08/21  9:21 PM   Specimen: Nasopharyngeal Swab; Nasopharyngeal(NP) swabs in vial transport medium  Result Value Ref Range   SARS Coronavirus 2 by RT PCR NEGATIVE NEGATIVE    Comment: (NOTE) SARS-CoV-2 target nucleic acids are NOT DETECTED.  The SARS-CoV-2 RNA is generally detectable in upper respiratory specimens during the acute phase of infection. The lowest concentration of SARS-CoV-2 viral copies this assay can detect is 138 copies/mL. A negative result does not preclude SARS-Cov-2 infection and should not be used as the sole basis for treatment or other patient management decisions. A negative result may occur with  improper specimen collection/handling, submission of specimen other than nasopharyngeal swab, presence of viral mutation(s) within the areas targeted by this assay, and inadequate number of viral copies(<138 copies/mL). A negative result must be combined with clinical observations, patient history, and epidemiological information. The expected result is Negative.  Fact Sheet for Patients:  EntrepreneurPulse.com.au  Fact Sheet for Healthcare Providers:   IncredibleEmployment.be  This test is no t yet approved or cleared by the [place] FDA and  has been authorized for detection and/or diagnosis of SARS-CoV-2 by FDA under an Emergency Use Authorization (EUA). This EUA will remain  in effect (meaning this test can be used) for the duration of the COVID-19 declaration under Section 564(b)(1) of the Act, 21 U.S.C.section 360bbb-3(b)(1), unless the authorization is terminated  or revoked sooner.       Influenza A by PCR NEGATIVE NEGATIVE   Influenza B by PCR NEGATIVE NEGATIVE    Comment: (NOTE) The Xpert Xpress SARS-CoV-2/FLU/RSV plus assay is intended as an aid in the diagnosis of influenza from Nasopharyngeal swab specimens and should not be used as a sole basis for treatment. Nasal washings and aspirates are unacceptable for Xpert Xpress SARS-CoV-2/FLU/RSV testing.  Fact Sheet for Patients: EntrepreneurPulse.com.au  Fact Sheet for Healthcare Providers: IncredibleEmployment.be  This test is not yet approved or cleared by the [place] FDA and has been authorized for detection and/or diagnosis of SARS-CoV-2 by FDA under an Emergency Use Authorization (EUA). This EUA will remain in effect (meaning this test can be used) for the duration of the COVID-19 declaration under Section 564(b)(1) of the Act, 21 U.S.C. section 360bbb-3(b)(1), unless the authorization is terminated or revoked.  Performed at [facility], [place] [address] [place], [place] [address]    DG Chest Port 1 View  Result Date: 07/08/2021 CLINICAL DATA:  Shortness of breath. Hx of AAA, asthma, CHF, COPD, CAD, MI, pna, pulm HTN, CABG. EXAM: PORTABLE CHEST 1 VIEW COMPARISON:  Chest x-ray 11/13/2020, CT chest 11/14/2020. FINDINGS: Cardiomegaly. The heart and mediastinal contours are unchanged. Prominence of the perihilar regions again noted. Aortic calcification. Bilateral lung apices with  limited evaluation due to overlying mandible. Right costophrenic angle scarring/atelectasis. No focal consolidation. Increased interstitial markings. No pleural effusion. No pneumothorax. No acute osseous abnormality. IMPRESSION: 1.  Cardiomegaly with pulmonary edema. 2. Bilateral lung apices with limited evaluation due to overlying mandible. Electronically Signed   By: Iven Finn M.D.   On: 07/08/2021 20:44    Pending Labs Unresulted Labs (From admission, onward)     Start     Ordered   07/09/21 0107  Urinalysis, Routine w reflex microscopic  Once,   STAT        07/09/21 0106   07/08/21 2305  Legionella Pneumophila Serogp 1 Ur Ag  Once,   STAT        07/08/21 2304   07/08/21 2305  Strep pneumoniae urinary antigen  Once,   STAT        07/08/21 2304   07/08/21 2305  Expectorated Sputum Assessment w Gram Stain, Rflx to Resp Cult  Once,   R        07/08/21 2304   Signed and Held  Magnesium  Tomorrow morning,   R        Signed and Held   Signed and Held  Phosphorus  Tomorrow morning,   R        Signed and Held   Signed and Held  CBC WITH DIFFERENTIAL  Tomorrow morning,   R        Signed and Held   Signed and Held  TSH  Tomorrow morning,   R        Signed and Held   Signed and Held  Comprehensive metabolic panel  Tomorrow morning,   R        Signed and Held            Vitals/Pain Today's Vitals   07/08/21 1949 07/08/21 2200 07/09/21 0010 07/09/21 0100  BP:  122/73 (!) 102/58 91/61  Pulse:  (!) 103 87 74  Resp:  (!) 21 (!) 22 14  Temp:    98.7 F (37.1 C)  TempSrc:    Oral  SpO2:  90% 95% 100%  Weight: 69.9 kg     Height: '5\' 7"'$  (1.702 m)     PainSc: 0-No pain       Isolation Precautions Airborne and Contact precautions  Medications Medications  pantoprazole (PROTONIX) injection 40 mg (40 mg Intravenous Given 07/08/21 2220)  furosemide (LASIX) injection 20 mg (has no administration in time range)  Ampicillin-Sulbactam (UNASYN) 3 g in sodium chloride 0.9 % 100 mL IVPB  (has no administration in time range)  methylPREDNISolone sodium succinate (SOLU-MEDROL) 125 mg/2 mL injection 125 mg (125 mg Intravenous Given 07/08/21 2101)  furosemide (LASIX) injection 20 mg (20 mg Intravenous Given 07/08/21 2144)  glucagon (human recombinant) (GLUCAGEN) injection 1 mg (1 mg Intravenous Given 07/08/21 2220)  Ampicillin-Sulbactam (UNASYN) 3 g in sodium chloride 0.9 % 100 mL IVPB (3 g Intravenous New Bag/Given 07/09/21 0009)    Mobility walks with device High fall risk   Focused Assessments    R Recommendations: See Admitting Provider Note  Report given to:   Additional Notes:

## 2021-07-09 NOTE — Evaluation (Signed)
Occupational Therapy Evaluation Patient Details Name: Vincent Robinson MRN: [id number] DOB: 05-28-1923 Today's Date: 07/09/2021   History of Present Illness Patient is a 87 year old male who presented to the hosptial with worsening shortness of breath. patient was found to have CHF exacerbation and dysphagia.  PMH: asthma, COPD, chronic respiratory failure on home O2 2-3L, CAD, Hyperlipidemia, chronic diastolic CHF, 0000000,   Clinical Impression   Patient is a 87 year old male who was admitted for above. Patient was living at [place] with daughter support prior level. Currently, patient is min A for supine to sit on edge of bed on 4L/min with O2 dropping to 83% on 4L/min with increased time to deep breath back up to 90%. Patient was noted to have decreased activity tolerance, decreased endurance, decreased cardiopulmonary tolerance impacting participation in ADLs. Patient would continue to benefit from skilled OT services at this time while admitted and after d/c to address noted deficits in order to improve overall safety and independence in ADLs.       Recommendations for follow up therapy are one component of a multi-disciplinary discharge planning process, led by the attending physician.  Recommendations may be updated based on patient status, additional functional criteria and insurance authorization.   Follow Up Recommendations  SNF;Supervision/Assistance - 24 hour    Equipment Recommendations  None recommended by OT    Recommendations for Other Services       Precautions / Restrictions Precautions Precautions: Fall Precaution Comments: monitor O2 and BP, JP drain R Restrictions Weight Bearing Restrictions: No      Mobility Bed Mobility Overal bed mobility: Needs Assistance Bed Mobility: Supine to Sit     Supine to sit: HOB elevated;Min assist     General bed mobility comments: with education on rolling to avoid pulling JP drain or catheter    Transfers Overall transfer  level: Needs assistance   Transfers: Sit to/from Stand Sit to Stand: Min assist              Balance Overall balance assessment: Needs assistance Sitting-balance support: Feet supported Sitting balance-Leahy Scale: Fair                                     ADL either performed or assessed with clinical judgement   ADL Overall ADL's : Needs assistance/impaired   Eating/Feeding Details (indicate cue type and reason): patient is on clear liquids at this time and declining to have any during this assessment Grooming: Wash/dry face;Sitting;Oral care;Min guard   Upper Body Bathing: Minimal assistance;Sitting   Lower Body Bathing: Moderate assistance;Sitting/lateral leans   Upper Body Dressing : Set up;Sitting   Lower Body Dressing: Moderate assistance;Sitting/lateral leans     Toilet Transfer Details (indicate cue type and reason): deferred with BP low today patient also reporting he was already out of bed today. patient able to stand with min A from edge of bed to participate in scoots to [place] and Hygiene: Sitting/lateral lean;Moderate assistance               Vision Patient Visual Report: No change from baseline       Perception     Praxis      Pertinent Vitals/Pain Pain Assessment: No/denies pain     Hand Dominance Right   Extremity/Trunk Assessment Upper Extremity Assessment Upper Extremity Assessment: Overall WFL for tasks assessed   Lower Extremity Assessment Lower Extremity  Assessment: Defer to PT evaluation   Cervical / Trunk Assessment Cervical / Trunk Assessment: Kyphotic   Communication Communication Communication: No difficulties   Cognition Arousal/Alertness: Awake/alert Behavior During Therapy: WFL for tasks assessed/performed Overall Cognitive Status: Within Functional Limits for tasks assessed                                     General Comments  patient reported his O2  lives from 87% to 94% on 2-3L/min at home. patient was noted to have O2 drop to 83% on 4L/min with movement to sit up on edge of bed. patient was able to deeep breath O2 saturation back up to 88% within 1 min. patient's BP was noted to be 91/61 mmhg at rest and 93/52 mmhg sitting on edge of bed.    Exercises     Shoulder Instructions      Home Living Family/patient expects to be discharged to:: Private residence (ILF) Living Arrangements: Alone Available Help at Discharge: Family;Available 24 hours/day Type of Home: Apartment Home Access: Level entry     Home Layout: One level                   Additional Comments: patient uses 2-3 L at ILF.      Prior Functioning/Environment Level of Independence: Needs assistance  Gait / Transfers Assistance Needed: patient reported not needing RW. ADL's / Homemaking Assistance Needed: patients daughter sets out medications for patient. otherwise independent   Comments: patient reported moving to [place] for ALF setting. patient reported he does all ADLs. patient reported cooking in apartment especailly morning meals.        OT Problem List:        OT Treatment/Interventions: Self-care/ADL training;Therapeutic exercise;DME and/or AE instruction;Energy conservation;Therapeutic activities;Balance training;Patient/family education    OT Goals(Current goals can be found in the care plan section) Acute Rehab OT Goals Patient Stated Goal: to get back home soon OT Goal Formulation: With patient Time For Goal Achievement: 07/23/21 Potential to Achieve Goals: Good  OT Frequency: Min 2X/week   Barriers to D/C: Decreased caregiver support  lives in [place] OT "6 Clicks" Daily Activity     Outcome Measure Help from another person eating meals?: Total (clear liquids at this time) Help from another person taking care of personal grooming?: A Little Help from another person toileting, which  includes using toliet, bedpan, or urinal?: A Lot Help from another person bathing (including washing, rinsing, drying)?: A Lot Help from another person to put on and taking off regular upper body clothing?: A Little Help from another person to put on and taking off regular lower body clothing?: A Lot 6 Click Score: 13   End of Session Equipment Utilized During Treatment: Gait belt Nurse Communication: Mobility status;Other (comment) (low BP and O2)  Activity Tolerance: Patient tolerated treatment well Patient left: in bed;with call bell/phone within reach;with bed alarm set  OT Visit Diagnosis: Unsteadiness on feet (R26.81);Muscle weakness (generalized) (M62.81)                Time: [id number] OT Time Calculation (min): 29 min Charges:  OT General Charges $OT Visit: 1 Visit OT Evaluation $OT Eval Moderate Complexity: 1 Mod OT Treatments $Therapeutic Activity: 8-22 mins  Jackelyn Poling OTR/L, MS Acute Rehabilitation Department Office# [phone] Pager#  [place] 07/09/2021, 5:01 PM

## 2021-07-09 NOTE — Progress Notes (Signed)
  Echocardiogram 2D Echocardiogram with contrast has been performed.  Vincent Robinson F 07/09/2021, 11:27 AM

## 2021-07-09 NOTE — Evaluation (Signed)
Clinical/Bedside Swallow Evaluation Patient Details  Name: Vincent Robinson MRN: [id number] Date of Birth: [birth year]  Today's Date: 07/09/2021 Time: SLP Start Time (ACUTE ONLY): 32 SLP Stop Time (ACUTE ONLY): [id number] SLP Time Calculation (min) (ACUTE ONLY): 20 min  Past Medical History:  Past Medical History:  Diagnosis Date   AAA (abdominal aortic aneurysm) ([place])    Acute respiratory failure ([place]) 11/23/2019   Asthma    Basal cell carcinoma    CHF (congestive heart failure) (HCC)    Chronic respiratory failure (HCC)    COPD (chronic obstructive pulmonary disease) (HCC)    Coronary artery disease    Diverticulitis    Dysrhythmia    afib   History of chronic respiratory failure    Hyperlipidemia    Hypothyroidism    Moderate aortic stenosis    Myocardial infarction ([place])    Pneumonia    Pulmonary HTN ([place])    Right heart failure ([place])    Skin cancer 2017   bladder    Sleep apnea    Bipap   Past Surgical History:  Past Surgical History:  Procedure Laterality Date   bleeding ulcer  2016   BYPASS AXILLA/BRACHIAL ARTERY  1998   colon bleed  2012   COLONOSCOPY N/A 06/07/2017   Procedure: COLONOSCOPY;  Surgeon: Carol Ada, MD;  Location: [facility];  Service: Endoscopy;  Laterality: N/A;   CORONARY ARTERY BYPASS GRAFT     5 vessel   DEBRIDMENT OF DECUBITUS ULCER  2002   EYE SURGERY     cataract   IR ANGIOGRAM SELECTIVE EACH ADDITIONAL VESSEL  03/22/2020   IR ANGIOGRAM VISCERAL SELECTIVE  03/22/2020   IR EXCHANGE BILIARY DRAIN  12/02/2020   IR EXCHANGE BILIARY DRAIN  02/08/2021   IR EXCHANGE BILIARY DRAIN  04/05/2021   IR EXCHANGE BILIARY DRAIN  06/05/2021   IR PERC CHOLECYSTOSTOMY  09/08/2020   IR RADIOLOGIST EVAL & MGMT  10/06/2020   IR RADIOLOGIST EVAL & MGMT  10/26/2020   IR US GUIDE VASC ACCESS RIGHT  03/22/2020   RIGHT HEART CATH N/A 12/11/2017   Procedure: RIGHT HEART CATH;  Surgeon: Jolaine Artist, MD;  Location: [facility];  Service: Cardiovascular;   Laterality: N/A;   THORACIC AORTA STENT  2009   TRANSURETHRAL RESECTION OF BLADDER TUMOR WITH MITOMYCIN-C N/A 03/09/2021   Procedure: TRANSURETHRAL RESECTION OF BLADDER TUMOR;  Surgeon: Franchot Gallo, MD;  Location: [facility];  Service: Urology;  Laterality: N/A;   HPI:  Patient is a 87 y.o. male with PMH: COPD, CHF, and wears 3L of O2, bladder cancer who presented to hospital with worsening SOB, satting at 81% on baseline 3L oxygen. He reported that he had some hot soup in AM of admission and ever since that he cannot swallow anything. GI was consulted and he was subsequently diagnosed with aspiration PNA.  EGD was planned for 9/18 however was cancelled due to patient with low BP and was at high risk per anesthesia physican.    Assessment / Plan / Recommendation  Clinical Impression  Patient presents with a suspected pharyngeal dysphagia with questionable esophageal component. He reported that he has had difficulties with swallowing for past "several" years but currently coughing post swallows is worse than his baseline. Patient did report some expectoration of phlegm however during coughing and throat clear episodes with PO intake during this assessment, no expectoration was observed. Patient did exhibit frequent immediate throat clearing and coughing with liquids (warm broth) but this was reduced when  eating gelatin. During swallows, audible 'squeezing' sound heard. SLP is recommending MBS next date to assess swallow function and determine any swallow safety strategies. Continue with clear liquids until testing complete next date. SLP Visit Diagnosis: Dysphagia, unspecified (R13.10)    Aspiration Risk  Mild aspiration risk;Moderate aspiration risk    Diet Recommendation Thin liquid   Liquid Administration via: Cup Medication Administration: Whole meds with liquid Supervision: Patient able to self feed Compensations: Small sips/bites;Slow rate Postural Changes: Seated upright at 90 degrees     Other  Recommendations Oral Care Recommendations: Oral care BID    Recommendations for follow up therapy are one component of a multi-disciplinary discharge planning process, led by the attending physician.  Recommendations may be updated based on patient status, additional functional criteria and insurance authorization.  Follow up Recommendations Other (comment) (TBD)      Frequency and Duration min 1 x/week  1 week       Prognosis Prognosis for Safe Diet Advancement: Good      Swallow Study   General Date of Onset: 07/08/21 HPI: Patient is a 87 y.o. male with PMH: COPD, CHF, and wears 3L of O2, bladder cancer who presented to hospital with worsening SOB, satting at 81% on baseline 3L oxygen. He reported that he had some hot soup in AM of admission and ever since that he cannot swallow anything. GI was consulted and he was subsequently diagnosed with aspiration PNA.  EGD was planned for 9/18 however was cancelled due to patient with low BP and was at high risk per anesthesia physican. Type of Study: Bedside Swallow Evaluation Previous Swallow Assessment: BSE during previous admission 11/15/20, no dysphagia indicated at that time Diet Prior to this Study: Thin liquids Temperature Spikes Noted: No Respiratory Status: Nasal cannula History of Recent Intubation: No Behavior/Cognition: Alert;Cooperative;Pleasant mood Oral Cavity Assessment: Within Functional Limits Oral Care Completed by SLP: No Oral Cavity - Dentition: Dentures, top;Dentures, bottom Vision: Functional for self-feeding Self-Feeding Abilities: Able to feed self Patient Positioning: Upright in bed Baseline Vocal Quality: Normal Volitional Cough: Strong Volitional Swallow: Able to elicit    Oral/Motor/Sensory Function Overall Oral Motor/Sensory Function: Within functional limits   Ice Chips     Thin Liquid Thin Liquid: Impaired Presentation: Self Fed;Cup Pharyngeal  Phase Impairments: Cough - Immediate;Throat  Clearing - Immediate Other Comments: pharyngeal contraction audible squeezing sound    Nectar Thick     Honey Thick     Puree Puree: Not tested   Solid     Solid: Not tested     Sonia Baller, MA, CCC-SLP Speech Therapy

## 2021-07-09 NOTE — Consult Note (Signed)
Referring Provider:  ED Primary Care Physician:  Katherina Mires, MD Primary Gastroenterologist: Althia Forts  Reason for Consultation: Dysphagia  HPI: Vincent Robinson is a 87 y.o. male with past medical history of COPD, CHF on chronic oxygen therapy, history of bladder cancer presented to the hospital with shortness of breath.  He was found to have 81% saturation on 3 L of oxygen.  He was also complaining of trouble swallowing and GI is consulted for further evaluation.  He was subsequently diagnosed with aspiration pneumonia.  Currently on antibiotics.  He is currently requiring 6 L of oxygen to maintain saturation.  CBC showed mild leukocytosis.  Normal LFTs.  Chronically elevated troponins.  Patient seen and examined at bedside.  He continues to have shortness of breath.  He denies any further sensation of food stuck in esophagus.  Denies nausea and vomiting.  Denies active chest pain.  Past Medical History:  Diagnosis Date   AAA (abdominal aortic aneurysm) ([place])    Acute respiratory failure ([place]) 11/23/2019   Asthma    Basal cell carcinoma    CHF (congestive heart failure) (HCC)    Chronic respiratory failure (HCC)    COPD (chronic obstructive pulmonary disease) (HCC)    Coronary artery disease    Diverticulitis    Dysrhythmia    afib   History of chronic respiratory failure    Hyperlipidemia    Hypothyroidism    Moderate aortic stenosis    Myocardial infarction ([place])    Pneumonia    Pulmonary HTN ([place])    Right heart failure ([place])    Skin cancer 2017   bladder    Sleep apnea    Bipap    Past Surgical History:  Procedure Laterality Date   bleeding ulcer  2016   BYPASS AXILLA/BRACHIAL ARTERY  1998   colon bleed  2012   COLONOSCOPY N/A 06/07/2017   Procedure: COLONOSCOPY;  Surgeon: Carol Ada, MD;  [facility];  Service: Endoscopy;  Laterality: N/A;   CORONARY ARTERY BYPASS GRAFT     5 vessel   DEBRIDMENT OF DECUBITUS ULCER  2002   EYE SURGERY      cataract   IR ANGIOGRAM SELECTIVE EACH ADDITIONAL VESSEL  03/22/2020   IR ANGIOGRAM VISCERAL SELECTIVE  03/22/2020   IR EXCHANGE BILIARY DRAIN  12/02/2020   IR EXCHANGE BILIARY DRAIN  02/08/2021   IR EXCHANGE BILIARY DRAIN  04/05/2021   IR EXCHANGE BILIARY DRAIN  06/05/2021   IR PERC CHOLECYSTOSTOMY  09/08/2020   IR RADIOLOGIST EVAL & MGMT  10/06/2020   IR RADIOLOGIST EVAL & MGMT  10/26/2020   IR US GUIDE VASC ACCESS RIGHT  03/22/2020   RIGHT HEART CATH N/A 12/11/2017   Procedure: RIGHT HEART CATH;  Surgeon: Jolaine Artist, MD;  Location: [facility];  Service: Cardiovascular;  Laterality: N/A;   THORACIC AORTA STENT  2009   TRANSURETHRAL RESECTION OF BLADDER TUMOR WITH MITOMYCIN-C N/A 03/09/2021   Procedure: TRANSURETHRAL RESECTION OF BLADDER TUMOR;  Surgeon: Franchot Gallo, MD;  [facility];  Service: Urology;  Laterality: N/A;    Prior to Admission medications   Medication Sig Start Date End Date Taking? Authorizing Provider  albuterol (VENTOLIN HFA) 108 (90 Base) MCG/ACT inhaler Inhale 1 puff into the lungs every 6 (six) hours as needed for wheezing or shortness of breath.   Yes [provider]  ferrous sulfate 325 (65 FE) MG tablet Take 325 mg by mouth daily with breakfast.   Yes [provider]  levothyroxine (SYNTHROID) 88 MCG tablet Take 88 mcg by mouth daily before breakfast.   Yes [provider]  macitentan (OPSUMIT) 10 MG tablet Take 1 tablet (10 mg total) by mouth daily. Needs appt for future refills Patient taking differently: Take 10 mg by mouth daily. 08/19/20  Yes Bensimhon, Shaune Pascal, MD  midodrine (PROAMATINE) 10 MG tablet Take 1 tablet (10 mg total) by mouth 3 (three) times daily after meals. In addition with the '5mg'$  tablets Patient taking differently: Take 10 mg by mouth 3 (three) times daily. Take with a 5 mg tablet for a total dose of 15 mg 12/29/20  Yes Bensimhon, Shaune Pascal, MD  midodrine (PROAMATINE) 5 MG tablet Take 1 tablet (5 mg  total) by mouth 3 (three) times daily with meals. In addition with the '10mg'$  tablets Patient taking differently: Take 5 mg by mouth 3 (three) times daily. Take with a 10 mg tablet for a total dose of 15 mg 12/29/20  Yes Bensimhon, Shaune Pascal, MD  Mometasone Furo-Formoterol Fum (DULERA IN) Inhale 1 puff into the lungs 2 (two) times daily as needed (shortness of breath).   Yes [provider]  montelukast (SINGULAIR) 10 MG tablet Take 10 mg by mouth at bedtime.   Yes [provider]  pantoprazole (PROTONIX) 20 MG tablet Take 20 mg by mouth every morning.   Yes [provider]  potassium chloride SA (KLOR-CON) 20 MEQ tablet Take 20 mEq by mouth in the morning and at bedtime. 09/27/20  Yes [provider]  sildenafil (REVATIO) 20 MG tablet TAKE TWO TABLETS BY MOUTH THREE TIMES A DAY Patient taking differently: Take 40 mg by mouth 3 (three) times daily. For pulmonary hypertension 11/02/20  Yes Rosita Fire, Brittainy M, PA-C  simvastatin (ZOCOR) 10 MG tablet Take 10 mg by mouth at bedtime.   Yes [provider]  Sodium Chloride Flush (NORMAL SALINE FLUSH) 0.9 % SOLN USE 5 MLS 2 TIMES DAILY AS DIRECTED Patient taking differently: 5 mLs See admin instructions. Flush gall bladder drain with 5 mls twice weekly 09/22/20 09/22/21 Yes Clovis Riley, MD  torsemide (DEMADEX) 20 MG tablet TAKE TWO TABLETS BY MOUTH DAILY Patient taking differently: Take 40 mg by mouth every morning. 01/11/21  Yes Bensimhon, Shaune Pascal, MD  Vitamin D, Ergocalciferol, (DRISDOL) 1.25 MG (50000 UNIT) CAPS capsule Take 50,000 Units by mouth every Sunday. 05/26/21  Yes [provider]    Scheduled Meds:  furosemide  20 mg Intravenous Q12H   mouth rinse  15 mL Mouth Rinse BID   mometasone-formoterol  2 puff Inhalation BID   pantoprazole (PROTONIX) IV  40 mg Intravenous Q12H   sodium chloride flush  3 mL Intravenous Q12H   Continuous Infusions:  sodium chloride 250 mL (07/09/21 0915)    ampicillin-sulbactam (UNASYN) IV 3 g (07/09/21 0916)   PRN Meds:.sodium chloride, acetaminophen **OR** acetaminophen, albuterol, sodium chloride flush  Allergies as of 07/08/2021 - Review Complete 07/08/2021  Allergen Reaction Noted   Hydrocodone Other (See Comments) 07/08/2021    Family History  Problem Relation Age of Onset   Emphysema Mother    Stroke Father    Movement disorder Sister    Aneurysm Brother        Thoracic aorta   Macular degeneration Brother     Social History   Socioeconomic History   Marital status: Widowed    Spouse name: Not on file   Number of children: Not on file   Years of education: Not  on file   Highest education level: Not on file  Occupational History   Not on file  Tobacco Use   Smoking status: Former    Packs/day: 0.50    Types: Cigarettes    Quit date: 08/06/1961    Years since quitting: 59.9   Smokeless tobacco: Never  Vaping Use   Vaping Use: Never used  Substance and Sexual Activity   Alcohol use: Not Currently   Drug use: Never   Sexual activity: Not Currently  Other Topics Concern   Not on file  Social History Narrative   Not on file   Social Determinants of Health   Financial Resource Strain: Not on file  Food Insecurity: Not on file  Transportation Needs: Not on file  Physical Activity: Not on file  Stress: Not on file  Social Connections: Not on file  Intimate Partner Violence: Not on file    Review of Systems: All negative except as stated above in HPI.  Physical Exam: Vital signs: Vitals:   07/09/21 0802 07/09/21 0957  BP:  105/76  Pulse:  93  Resp:  18  Temp:  97.6 F (36.4 C)  SpO2: (!) 87% (!) 88%   Last BM Date: 07/08/21 General:   Elderly patient, not in acute distress Lungs: Decreased breath sounds bilaterally Heart:  Regular rate and rhythm.  Murmur noted Abdomen: Soft, nontender, nondistended, bowel sounds present.  No peritoneal signs Neuro : Alert and oriented x3 Psych : Mood and affect  normal Rectal:  Deferred  GI:  Lab Results: Recent Labs    07/08/21 2100 07/09/21 0252  WBC 11.0* 12.0*  HGB 11.7* 12.7*  HCT 38.1* 40.4  PLT 285 294   BMET Recent Labs    07/08/21 2100 07/09/21 0252  NA 138 137  K 3.5 3.5  CL 98 97*  CO2 28 26  GLUCOSE 95 141*  BUN 22 22  CREATININE 1.04 1.02  CALCIUM 8.9 8.6*   LFT Recent Labs    07/09/21 0252  PROT 8.2*  ALBUMIN 3.9  AST 29  ALT 18  ALKPHOS 89  BILITOT 1.2   PT/INR No results for input(s): LABPROT, INR in the last 72 hours.   Studies/Results: DG Chest Port 1 View  Result Date: 07/08/2021 CLINICAL DATA:  Shortness of breath. Hx of AAA, asthma, CHF, COPD, CAD, MI, pna, pulm HTN, CABG. EXAM: PORTABLE CHEST 1 VIEW COMPARISON:  Chest x-ray 11/13/2020, CT chest 11/14/2020. FINDINGS: Cardiomegaly. The heart and mediastinal contours are unchanged. Prominence of the perihilar regions again noted. Aortic calcification. Bilateral lung apices with limited evaluation due to overlying mandible. Right costophrenic angle scarring/atelectasis. No focal consolidation. Increased interstitial markings. No pleural effusion. No pneumothorax. No acute osseous abnormality. IMPRESSION: 1. Cardiomegaly with pulmonary edema. 2. Bilateral lung apices with limited evaluation due to overlying mandible. Electronically Signed   By: Iven Finn M.D.   On: 07/08/2021 20:44    Impression/Plan: -Dysphagia in a 87 year old patient with multiple medical comorbidities.  According to patient, he does not have any sensation of food  stuck in esophagus. -Hypoxic respiratory failure currently on 6 L oxygen supplement -Aspiration pneumonia -CHF and COPD  Recommendations ------------------------ -Initial plan was to do EGD today but patient would be at high risk according to anesthesia physician Dr. Doroteo Glassman and recommended to hold off on EGD today. -Patient does not have any sensation of food stuck in esophagus, so we will go ahead and start him  on a clear liquid diet. -[facility] will follow  tomorrow -Plan was discussed with RN    LOS: 1 day   Otis Brace  MD, FACP 07/09/2021, 10:43 AM  Contact #  [phone]

## 2021-07-10 ENCOUNTER — Inpatient Hospital Stay (HOSPITAL_COMMUNITY): Payer: Medicare Other

## 2021-07-10 LAB — BASIC METABOLIC PANEL
Anion gap: 10 (ref 5–15)
BUN: 31 mg/dL — ABNORMAL HIGH (ref 8–23)
CO2: 28 mmol/L (ref 22–32)
Calcium: 8.7 mg/dL — ABNORMAL LOW (ref 8.9–10.3)
Chloride: 102 mmol/L (ref 98–111)
Creatinine, Ser: 1.25 mg/dL — ABNORMAL HIGH (ref 0.61–1.24)
GFR, Estimated: 53 mL/min — ABNORMAL LOW (ref >60)
Glucose, Bld: 120 mg/dL — ABNORMAL HIGH (ref 70–99)
Potassium: 4.2 mmol/L (ref 3.5–5.1)
Sodium: 140 mmol/L (ref 135–145)

## 2021-07-10 LAB — CBC
HCT: 32.2 % — ABNORMAL LOW (ref 39.0–52.0)
Hemoglobin: 10.1 g/dL — ABNORMAL LOW (ref 13.0–17.0)
MCH: 26.6 pg (ref 26.0–34.0)
MCHC: 31.4 g/dL (ref 30.0–36.0)
MCV: 84.7 fL (ref 80.0–100.0)
Platelets: 240 10*3/uL (ref 150–400)
RBC: 3.8 MIL/uL — ABNORMAL LOW (ref 4.22–5.81)
WBC: 11.5 10*3/uL — ABNORMAL HIGH (ref 4.0–10.5)
nRBC: 0 % (ref 0.0–0.2)

## 2021-07-10 LAB — LEGIONELLA PNEUMOPHILA SEROGP 1 UR AG: L. pneumophila Serogp 1 Ur Ag: NEGATIVE

## 2021-07-10 MED ORDER — PANTOPRAZOLE SODIUM 40 MG PO TBEC
40.0000 mg | DELAYED_RELEASE_TABLET | Freq: Two times a day (BID) | ORAL | Status: DC
  Administered 2021-07-10 – 2021-07-11 (×2): 40 mg via ORAL
  Filled 2021-07-10 (×2): qty 1

## 2021-07-10 NOTE — Progress Notes (Signed)
PHARMACIST - PHYSICIAN COMMUNICATION  DR:   Pietro Cassis  CONCERNING: IV to Oral Route Change Policy  RECOMMENDATION: This patient is receiving pantoprazole by the intravenous route.  Based on criteria approved by the Pharmacy and Therapeutics Committee, the intravenous medication(s) is/are being converted to the equivalent oral dose form(s).   DESCRIPTION: These criteria include: The patient is eating (either orally or via tube) and/or has been taking other orally administered medications for a least 24 hours The patient has no evidence of active gastrointestinal bleeding or impaired GI absorption (gastrectomy, short bowel, patient on TNA or NPO).  If you have questions about this conversion, please contact the Pharmacy Department  '[]'$   [phone] )  [facility] '[]'$   [phone] )  [facility] '[]'$   [phone] )  [facility] '[]'$   [phone] )  [facility] '[x]'$   [phone] )  [place], [facility] 07/10/2021 3:27 PM

## 2021-07-10 NOTE — Evaluation (Signed)
Physical Therapy Evaluation Patient Details Name: Vincent Robinson MRN: [id number] DOB: 03-04-1923 Today's Date: 07/10/2021  History of Present Illness  Patient is a 87 year old male who presented to the hosptial with worsening shortness of breath. patient was found to have CHF exacerbation and dysphagia.  PMH: asthma, COPD, chronic respiratory failure on home O2 2-3L, CAD, Hyperlipidemia, chronic diastolic CHF, 0000000,  Clinical Impression  Then patient is progressing well. Ambulated in room using cane with   supervision. Patient resides in an apartment alone.Reports daughter is very  supportive. Patient does have a RW if needed. SPO2 on 4 L 88% after ambulation. 95 % when resting.Pt admitted with above diagnosis.  Pt currently with functional limitations due to the deficits listed below (see PT Problem List). Pt will benefit from skilled PT to increase their independence and safety with mobility to allow discharge to the venue listed below.   Patient eager to return home     Recommendations for follow up therapy are one component of a multi-disciplinary discharge planning process, led by the attending physician.  Recommendations may be updated based on patient status, additional functional criteria and insurance authorization.  Follow Up Recommendations Home health PT    Equipment Recommendations  None recommended by PT    Recommendations for Other Services       Precautions / Restrictions Precautions Precaution Comments: monitor O2 , JP drain R      Mobility  Bed Mobility Overal bed mobility: Modified Independent                  Transfers Overall transfer level: Needs assistance Equipment used: Straight cane Transfers: Sit to/from Stand Sit to Stand: Supervision         General transfer comment: no assistance needed with cane  Ambulation/Gait Ambulation/Gait assistance: Supervision Gait Distance (Feet): 50 Feet Assistive device: Straight cane Gait  Pattern/deviations: Step-through pattern     General Gait Details: gait steady with cane  Stairs            Wheelchair Mobility    Modified Rankin (Stroke Patients Only)       Balance Overall balance assessment: No apparent balance deficits (not formally assessed)         Standing balance support: Single extremity supported;During functional activity Standing balance-Leahy Scale: Fair Standing balance comment: with cane                             Pertinent Vitals/Pain      Home Living Family/patient expects to be discharged to:: Private residence Living Arrangements: Alone Available Help at Discharge: Family;Available 24 hours/day Type of Home: Apartment Home Access: Level entry     Home Layout: One level Home Equipment: Walker - 4 wheels;Cane - single point Additional Comments: patient uses 2-3 L at ILF.    Prior Function Level of Independence: Needs assistance   Gait / Transfers Assistance Needed: patient reported not needing RW.  ADL's / Homemaking Assistance Needed: patients daughter sets out medications for patient. otherwise independent, does not drive  Comments: patient reported moving to [place] for ILF setting. patient reported he does all ADLs. patient reported cooking in apartment especailly morning meals.     Hand Dominance        Extremity/Trunk Assessment   Upper Extremity Assessment Upper Extremity Assessment: Overall WFL for tasks assessed    Lower Extremity Assessment Lower Extremity Assessment: Generalized weakness    Cervical / Trunk Assessment Cervical /  Trunk Assessment: Kyphotic  Communication   Communication: No difficulties  Cognition Arousal/Alertness: Awake/alert Behavior During Therapy: WFL for tasks assessed/performed Overall Cognitive Status: Within Functional Limits for tasks assessed                                        General Comments      Exercises     Assessment/Plan     PT Assessment Patient needs continued PT services  PT Problem List Decreased strength;Decreased mobility;Decreased activity tolerance;Decreased knowledge of use of DME       PT Treatment Interventions DME instruction;Therapeutic activities;Gait training;Therapeutic exercise;Patient/family education;Functional mobility training    PT Goals (Current goals can be found in the Care Plan section)  Acute Rehab PT Goals Patient Stated Goal: to get back home soon PT Goal Formulation: With patient Time For Goal Achievement: 07/24/21 Potential to Achieve Goals: Good    Frequency Min 3X/week   Barriers to discharge        Co-evaluation               AM-PAC PT "6 Clicks" Mobility  Outcome Measure Help needed turning from your back to your side while in a flat bed without using bedrails?: None Help needed moving from lying on your back to sitting on the side of a flat bed without using bedrails?: None Help needed moving to and from a bed to a chair (including a wheelchair)?: A Little Help needed standing up from a chair using your arms (e.g., wheelchair or bedside chair)?: A Little Help needed to walk in hospital room?: A Little Help needed climbing 3-5 steps with a railing? : A Little 6 Click Score: 20    End of Session Equipment Utilized During Treatment: Gait belt;Oxygen Activity Tolerance: Patient tolerated treatment well Patient left: in chair;with call bell/phone within reach;with chair alarm set Nurse Communication: Mobility status PT Visit Diagnosis: Unsteadiness on feet (R26.81)    Time: [id number] PT Time Calculation (min) (ACUTE ONLY): 36 min   Charges:     PT Treatments $Gait Training: 8-22 mins $Therapeutic Activity: 8-22 mins        Tresa Endo PT Acute Rehabilitation Services Pager [phone] Office [phone]   Claretha Cooper 07/10/2021, 12:52 PM

## 2021-07-10 NOTE — ED Provider Notes (Signed)
[facility]OLOGY Provider Note   CSN: [id number] Arrival date & time: 07/08/21  1932     History Chief Complaint  Patient presents with   Shortness of Breath   Dysphagia    Vincent Robinson is a 87 y.o. male.  Patient presents with shortness of breath and home oxygen of 81%  The history is provided by a relative and medical records. No language interpreter was used.  Shortness of Breath Severity:  Moderate Onset quality:  Sudden Timing:  Constant Chronicity:  New Context: activity   Relieved by:  Nothing Worsened by:  Nothing Ineffective treatments:  None tried     Past Medical History:  Diagnosis Date   AAA (abdominal aortic aneurysm) (HCC)    Acute respiratory failure ([place]) 11/23/2019   Asthma    Basal cell carcinoma    CHF (congestive heart failure) (HCC)    Chronic respiratory failure (HCC)    COPD (chronic obstructive pulmonary disease) (HCC)    Coronary artery disease    Diverticulitis    Dysrhythmia    afib   History of chronic respiratory failure    Hyperlipidemia    Hypothyroidism    Moderate aortic stenosis    Myocardial infarction ([place])    Pneumonia    Pulmonary HTN ([place])    Right heart failure ([place])    Skin cancer 2017   bladder    Sleep apnea    Bipap    Patient Active Problem List   Diagnosis Date Noted   Prolonged QT interval 07/09/2021   CHF exacerbation ([place]) 07/08/2021   Esophageal obstruction due to food impaction 07/08/2021   Acute on chronic diastolic CHF (congestive heart failure) ([place]) 07/08/2021   Malignant neoplasm of overlapping sites of bladder ([place]) 03/09/2021   Pneumonia due to COVID-19 virus 11/14/2020   Elevated troponin 11/14/2020   Bladder mass 11/14/2020   Acute cholecystitis 09/08/2020   Diverticular hemorrhage    Rectal bleeding 03/22/2020   Acute GI bleeding 03/22/2020   Hypokalemia    Hypotension    Acute respiratory failure with hypoxia ([place]) 12/18/2019   Acute on  chronic right-sided heart failure ([place]) 12/18/2019   Pressure injury of skin 11/24/2019   Chronic respiratory failure ([place])    Acute on chronic respiratory failure ([place]) 11/23/2019   Right heart failure (HCC)    S/P CABG (coronary artery bypass graft) 11/22/2019   Chest pain 11/22/2019   Atrial fibrillation ([place]) 08/04/2019   Pulmonary hypertension, unspecified ([place]) 10/11/2017   Aortic stenosis, moderate 10/11/2017   Coronary artery disease 08/30/2017   AAA (abdominal aortic aneurysm) without rupture ([place]) 08/30/2017   Acute blood loss anemia 06/07/2017   Hemorrhagic shock ([place]) 06/07/2017   Lower GI bleed 06/05/2017   Sleep apnea 06/05/2017   Hypothyroidism 06/05/2017   Hyperlipidemia 06/05/2017   Asthma 06/05/2017   ILD (interstitial lung disease) ([place]) 05/28/2017   Pneumothorax on right 05/20/2017   COPD (chronic obstructive pulmonary disease) ([place]) 05/20/2017   Pneumothorax 05/20/2017   Skin cancer 10/23/2015    Past Surgical History:  Procedure Laterality Date   bleeding ulcer  2016   BYPASS AXILLA/BRACHIAL ARTERY  1998   colon bleed  2012   COLONOSCOPY N/A 06/07/2017   Procedure: COLONOSCOPY;  Surgeon: Carol Ada, MD;  Location: [facility];  Service: Endoscopy;  Laterality: N/A;   CORONARY ARTERY BYPASS GRAFT     5 vessel   DEBRIDMENT OF DECUBITUS ULCER  2002   EYE SURGERY  cataract   IR ANGIOGRAM SELECTIVE EACH ADDITIONAL VESSEL  03/22/2020   IR ANGIOGRAM VISCERAL SELECTIVE  03/22/2020   IR EXCHANGE BILIARY DRAIN  12/02/2020   IR EXCHANGE BILIARY DRAIN  02/08/2021   IR EXCHANGE BILIARY DRAIN  04/05/2021   IR EXCHANGE BILIARY DRAIN  06/05/2021   IR PERC CHOLECYSTOSTOMY  09/08/2020   IR RADIOLOGIST EVAL & MGMT  10/06/2020   IR RADIOLOGIST EVAL & MGMT  10/26/2020   IR US GUIDE VASC ACCESS RIGHT  03/22/2020   RIGHT HEART CATH N/A 12/11/2017   Procedure: RIGHT HEART CATH;  Surgeon: Jolaine Artist, MD;  Location: [facility];  Service: Cardiovascular;   Laterality: N/A;   THORACIC AORTA STENT  2009   TRANSURETHRAL RESECTION OF BLADDER TUMOR WITH MITOMYCIN-C N/A 03/09/2021   Procedure: TRANSURETHRAL RESECTION OF BLADDER TUMOR;  Surgeon: Franchot Gallo, MD;  Location: [facility];  Service: Urology;  Laterality: N/A;       Family History  Problem Relation Age of Onset   Emphysema Mother    Stroke Father    Movement disorder Sister    Aneurysm Brother        Thoracic aorta   Macular degeneration Brother     Social History   Tobacco Use   Smoking status: Former    Packs/day: 0.50    Types: Cigarettes    Quit date: 08/06/1961    Years since quitting: 59.9   Smokeless tobacco: Never  Vaping Use   Vaping Use: Never used  Substance Use Topics   Alcohol use: Not Currently   Drug use: Never    Home Medications Prior to Admission medications   Medication Sig Start Date End Date Taking? Authorizing Provider  albuterol (VENTOLIN HFA) 108 (90 Base) MCG/ACT inhaler Inhale 1 puff into the lungs every 6 (six) hours as needed for wheezing or shortness of breath.   Yes [provider]  ferrous sulfate 325 (65 FE) MG tablet Take 325 mg by mouth daily with breakfast.   Yes [provider]  levothyroxine (SYNTHROID) 88 MCG tablet Take 88 mcg by mouth daily before breakfast.   Yes [provider]  macitentan (OPSUMIT) 10 MG tablet Take 1 tablet (10 mg total) by mouth daily. Needs appt for future refills Patient taking differently: Take 10 mg by mouth daily. 08/19/20  Yes Bensimhon, Shaune Pascal, MD  midodrine (PROAMATINE) 10 MG tablet Take 1 tablet (10 mg total) by mouth 3 (three) times daily after meals. In addition with the '5mg'$  tablets Patient taking differently: Take 10 mg by mouth 3 (three) times daily. Take with a 5 mg tablet for a total dose of 15 mg 12/29/20  Yes Bensimhon, Shaune Pascal, MD  midodrine (PROAMATINE) 5 MG tablet Take 1 tablet (5 mg total) by mouth 3 (three) times daily with meals. In addition with the '10mg'$   tablets Patient taking differently: Take 5 mg by mouth 3 (three) times daily. Take with a 10 mg tablet for a total dose of 15 mg 12/29/20  Yes Bensimhon, Shaune Pascal, MD  Mometasone Furo-Formoterol Fum (DULERA IN) Inhale 1 puff into the lungs 2 (two) times daily as needed (shortness of breath).   Yes [provider]  montelukast (SINGULAIR) 10 MG tablet Take 10 mg by mouth at bedtime.   Yes [provider]  pantoprazole (PROTONIX) 20 MG tablet Take 20 mg by mouth every morning.   Yes [provider]  potassium chloride SA (KLOR-CON) 20 MEQ tablet Take 20 mEq by mouth  in the morning and at bedtime. 09/27/20  Yes [provider]  sildenafil (REVATIO) 20 MG tablet TAKE TWO TABLETS BY MOUTH THREE TIMES A DAY Patient taking differently: Take 40 mg by mouth 3 (three) times daily. For pulmonary hypertension 11/02/20  Yes Rosita Fire, Brittainy M, PA-C  simvastatin (ZOCOR) 10 MG tablet Take 10 mg by mouth at bedtime.   Yes [provider]  Sodium Chloride Flush (NORMAL SALINE FLUSH) 0.9 % SOLN USE 5 MLS 2 TIMES DAILY AS DIRECTED Patient taking differently: 5 mLs See admin instructions. Flush gall bladder drain with 5 mls twice weekly 09/22/20 09/22/21 Yes Clovis Riley, MD  torsemide (DEMADEX) 20 MG tablet TAKE TWO TABLETS BY MOUTH DAILY Patient taking differently: Take 40 mg by mouth every morning. 01/11/21  Yes Bensimhon, Shaune Pascal, MD  Vitamin D, Ergocalciferol, (DRISDOL) 1.25 MG (50000 UNIT) CAPS capsule Take 50,000 Units by mouth every Sunday. 05/26/21  Yes [provider]    Allergies    Hydrocodone  Review of Systems   Review of Systems  Unable to perform ROS: Dementia  Respiratory:  Positive for shortness of breath.    Physical Exam Updated Vital Signs BP (!) 100/49   Pulse 62   Temp 97.7 F (36.5 C)   Resp 19   Ht '5\' 7"'$  (1.702 m)   Wt 71.2 kg   SpO2 90%   BMI 24.58 kg/m   Physical Exam Vitals and nursing note reviewed.  Constitutional:       Appearance: He is well-developed.  HENT:     Head: Normocephalic.     Nose: Nose normal.  Eyes:     General: No scleral icterus.    Conjunctiva/sclera: Conjunctivae normal.  Neck:     Thyroid: No thyromegaly.  Cardiovascular:     Rate and Rhythm: Normal rate and regular rhythm.     Heart sounds: No murmur heard.   No friction rub. No gallop.  Pulmonary:     Breath sounds: No stridor. No wheezing or rales.  Chest:     Chest wall: No tenderness.  Abdominal:     General: There is no distension.     Tenderness: There is no abdominal tenderness. There is no rebound.  Musculoskeletal:        General: Normal range of motion.     Cervical back: Neck supple.  Lymphadenopathy:     Cervical: No cervical adenopathy.  Skin:    Findings: No erythema or rash.  Neurological:     Mental Status: He is alert and oriented to person, place, and time.     Motor: No abnormal muscle tone.     Coordination: Coordination normal.  Psychiatric:        Behavior: Behavior normal.    ED Results / Procedures / Treatments   Labs (all labs ordered are listed, but only abnormal results are displayed) Labs Reviewed  CBC WITH DIFFERENTIAL/PLATELET - Abnormal; Notable for the following components:      Result Value   WBC 11.0 (*)    Hemoglobin 11.7 (*)    HCT 38.1 (*)    Neutro Abs 8.2 (*)    All other components within normal limits  COMPREHENSIVE METABOLIC PANEL - Abnormal; Notable for the following components:   Total Protein 8.8 (*)    All other components within normal limits  BRAIN NATRIURETIC PEPTIDE - Abnormal; Notable for the following components:   B Natriuretic Peptide 444.5 (*)    All other components within normal limits  URINALYSIS,  ROUTINE W REFLEX MICROSCOPIC - Abnormal; Notable for the following components:   Color, Urine YELLOW (*)    APPearance CLEAR (*)    All other components within normal limits  CBC WITH DIFFERENTIAL/PLATELET - Abnormal; Notable for the following  components:   WBC 12.0 (*)    Hemoglobin 12.7 (*)    Neutro Abs 10.9 (*)    All other components within normal limits  COMPREHENSIVE METABOLIC PANEL - Abnormal; Notable for the following components:   Chloride 97 (*)    Glucose, Bld 141 (*)    Calcium 8.6 (*)    Total Protein 8.2 (*)    All other components within normal limits  BASIC METABOLIC PANEL - Abnormal; Notable for the following components:   Glucose, Bld 120 (*)    BUN 31 (*)    Creatinine, Ser 1.25 (*)    Calcium 8.7 (*)    GFR, Estimated 53 (*)    All other components within normal limits  CBC - Abnormal; Notable for the following components:   WBC 11.5 (*)    RBC 3.80 (*)    Hemoglobin 10.1 (*)    HCT 32.2 (*)    All other components within normal limits  TROPONIN I (HIGH SENSITIVITY) - Abnormal; Notable for the following components:   Troponin I (High Sensitivity) 24 (*)    All other components within normal limits  TROPONIN I (HIGH SENSITIVITY) - Abnormal; Notable for the following components:   Troponin I (High Sensitivity) 21 (*)    All other components within normal limits  TROPONIN I (HIGH SENSITIVITY) - Abnormal; Notable for the following components:   Troponin I (High Sensitivity) 24 (*)    All other components within normal limits  RESP PANEL BY RT-PCR (FLU A&B, COVID) ARPGX2  EXPECTORATED SPUTUM ASSESSMENT W GRAM STAIN, RFLX TO RESP C  LEGIONELLA PNEUMOPHILA SEROGP 1 UR AG  STREP PNEUMONIAE URINARY ANTIGEN  MAGNESIUM  PHOSPHORUS  TSH  CBC WITH DIFFERENTIAL/PLATELET  BASIC METABOLIC PANEL    EKG EKG Interpretation  Date/Time:  Saturday July 08 2021 21:17:26 EDT Ventricular Rate:  108 PR Interval:  202 QRS Duration: 117 QT Interval:  386 QTC Calculation: 518 R Axis:   104 Text Interpretation: Poor data quality Confirmed by Carmin Muskrat [phone]) on 07/09/2021 8:36:26 PM  Radiology DG Chest Port 1 View  Result Date: 07/08/2021 CLINICAL DATA:  Shortness of breath. Hx of AAA, asthma,  CHF, COPD, CAD, MI, pna, pulm HTN, CABG. EXAM: PORTABLE CHEST 1 VIEW COMPARISON:  Chest x-ray 11/13/2020, CT chest 11/14/2020. FINDINGS: Cardiomegaly. The heart and mediastinal contours are unchanged. Prominence of the perihilar regions again noted. Aortic calcification. Bilateral lung apices with limited evaluation due to overlying mandible. Right costophrenic angle scarring/atelectasis. No focal consolidation. Increased interstitial markings. No pleural effusion. No pneumothorax. No acute osseous abnormality. IMPRESSION: 1. Cardiomegaly with pulmonary edema. 2. Bilateral lung apices with limited evaluation due to overlying mandible. Electronically Signed   By: Iven Finn M.D.   On: 07/08/2021 20:44   DG Swallowing Func-Speech Pathology  Result Date: 07/10/2021 Table formatting from the original result was not included. Objective Swallowing Evaluation: Type of Study: MBS-Modified Barium Swallow Study  Patient Details Name: Vincent Robinson MRN: [id number] Date of Birth: March 30, 1923 Today's Date: 07/10/2021 Time: SLP Start Time (ACUTE ONLY): 1250 -SLP Stop Time (ACUTE ONLY): 1310 SLP Time Calculation (min) (ACUTE ONLY): 20 min Past Medical History: Past Medical History: Diagnosis Date  AAA (abdominal aortic aneurysm) ([place])   Acute respiratory failure (  [place]) 11/23/2019  Asthma   Basal cell carcinoma   CHF (congestive heart failure) (HCC)   Chronic respiratory failure (HCC)   COPD (chronic obstructive pulmonary disease) (HCC)   Coronary artery disease   Diverticulitis   Dysrhythmia   afib  History of chronic respiratory failure   Hyperlipidemia   Hypothyroidism   Moderate aortic stenosis   Myocardial infarction ([place])   Pneumonia   Pulmonary HTN ([place])   Right heart failure ([place])   Skin cancer 2017  bladder   Sleep apnea   Bipap Past Surgical History: Past Surgical History: Procedure Laterality Date  bleeding ulcer  2016  BYPASS AXILLA/BRACHIAL ARTERY  1998  colon bleed  2012  COLONOSCOPY N/A 06/07/2017  Procedure:  COLONOSCOPY;  Surgeon: Carol Ada, MD;  [facility];  Service: Endoscopy;  Laterality: N/A;  CORONARY ARTERY BYPASS GRAFT    5 vessel  DEBRIDMENT OF DECUBITUS ULCER  2002  EYE SURGERY    cataract  IR ANGIOGRAM SELECTIVE EACH ADDITIONAL VESSEL  03/22/2020  IR ANGIOGRAM VISCERAL SELECTIVE  03/22/2020  IR EXCHANGE BILIARY DRAIN  12/02/2020  IR EXCHANGE BILIARY DRAIN  02/08/2021  IR EXCHANGE BILIARY DRAIN  04/05/2021  IR EXCHANGE BILIARY DRAIN  06/05/2021  IR PERC CHOLECYSTOSTOMY  09/08/2020  IR RADIOLOGIST EVAL & MGMT  10/06/2020  IR RADIOLOGIST EVAL & MGMT  10/26/2020  IR US GUIDE VASC ACCESS RIGHT  03/22/2020  RIGHT HEART CATH N/A 12/11/2017  Procedure: RIGHT HEART CATH;  Surgeon: Jolaine Artist, MD;  Location: [facility];  Service: Cardiovascular;  Laterality: N/A;  THORACIC AORTA STENT  2009  TRANSURETHRAL RESECTION OF BLADDER TUMOR WITH MITOMYCIN-C N/A 03/09/2021  Procedure: TRANSURETHRAL RESECTION OF BLADDER TUMOR;  Surgeon: Franchot Gallo, MD;  Location[facility];  Service: Urology;  Laterality: N/A; HPI: Patient is a 87 y.o. male with PMH: COPD, CHF, and wears 3L of O2, bladder cancer who presented to hospital with worsening SOB, satting at 81% on baseline 3L oxygen. He reported that he had some hot soup in AM of admission and ever since that he cannot swallow anything. GI was consulted and he was subsequently diagnosed with aspiration PNA.  EGD was planned for 9/18 however was cancelled due to patient with low BP and was at high risk per anesthesia physican.  Subjective: pleasant, alert Assessment / Plan / Recommendation CHL IP CLINICAL IMPRESSIONS 07/10/2021 Clinical Impression Patient presents with a mild oral dysphagia, mild-moderate pharyngeal phase dysphagia in addition to cervical esophageal component from prominent cricopharyngeal bar. During oral phase, patient exhibited a mild delay in anterior to posterior transit of puree and regular solids boluses and significant difficulty with bolus  cohesion when taking 1/2 barium tablet with water(required several sips of water to transit). During pharyngeal phase, patient exhibited decreased hyolaryngeal movement and reduced laryngeal/airway closure. With sips of thin liquid barium, he exhibited flash penetration (PAS 2), penetration above vocal cords that did not clear (PAS 3) and penetration to vocal cords that did not clear.(PAS 5). When he was drinking regular water with barium tablet, he had excessive coughing response and suspect he did have an incident of aspiration. With puree solids and regular solids, patient exhibited mild-mod vallecular residuals and trace pyriform residuals, with all residuals clearing after several subsequent dry swallows. Chin tuck posture with thin liquids did not prevent penetration of barium into laryngeal vestibule but did not appear to increase aspiration risk. Prominent cricopharyngeal bar observed, resulting in narrowing of upper esophagus and likely contributing to pharyngeal residuals.  SLP is recommending upgrade of patient's PO diet to Dys 2 solids, thin liquids with aspiration precautions. SLP Visit Diagnosis -- Attention and concentration deficit following -- Frontal lobe and executive function deficit following -- Impact on safety and function --   CHL IP TREATMENT RECOMMENDATION 07/10/2021 Treatment Recommendations Therapy as outlined in treatment plan below   Prognosis 07/10/2021 Prognosis for Safe Diet Advancement Good Barriers to Reach Goals -- Barriers/Prognosis Comment -- CHL IP DIET RECOMMENDATION 07/10/2021 SLP Diet Recommendations Dysphagia 2 (Fine chop) solids;Thin liquid Liquid Administration via Cup;Straw Medication Administration Whole meds with puree Compensations Slow rate;Small sips/bites;Minimize environmental distractions;Multiple dry swallows after each bite/sip Postural Changes Seated upright at 90 degrees   CHL IP OTHER RECOMMENDATIONS 07/10/2021 Recommended Consults -- Oral Care Recommendations  Oral care BID;Patient independent with oral care Other Recommendations --   CHL IP FOLLOW UP RECOMMENDATIONS 07/10/2021 Follow up Recommendations None   CHL IP FREQUENCY AND DURATION 07/10/2021 Speech Therapy Frequency (ACUTE ONLY) min 1 x/week Treatment Duration 1 week      CHL IP ORAL PHASE 07/10/2021 Oral Phase Impaired Oral - Pudding Teaspoon -- Oral - Pudding Cup -- Oral - Honey Teaspoon -- Oral - Honey Cup -- Oral - Nectar Teaspoon -- Oral - Nectar Cup -- Oral - Nectar Straw -- Oral - Thin Teaspoon -- Oral - Thin Cup WFL Oral - Thin Straw WFL Oral - Puree Reduced posterior propulsion Oral - Mech Soft -- Oral - Regular Reduced posterior propulsion Oral - Multi-Consistency -- Oral - Pill Reduced posterior propulsion;Decreased bolus cohesion Oral Phase - Comment --  CHL IP PHARYNGEAL PHASE 07/10/2021 Pharyngeal Phase Impaired Pharyngeal- Pudding Teaspoon -- Pharyngeal -- Pharyngeal- Pudding Cup -- Pharyngeal -- Pharyngeal- Honey Teaspoon -- Pharyngeal -- Pharyngeal- Honey Cup -- Pharyngeal -- Pharyngeal- Nectar Teaspoon -- Pharyngeal -- Pharyngeal- Nectar Cup -- Pharyngeal -- Pharyngeal- Nectar Straw -- Pharyngeal -- Pharyngeal- Thin Teaspoon -- Pharyngeal -- Pharyngeal- Thin Cup Reduced airway/laryngeal closure;Reduced anterior laryngeal mobility;Pharyngeal residue - valleculae;Lateral channel residue;Penetration/Aspiration during swallow Pharyngeal Material enters airway, remains ABOVE vocal cords then ejected out;Material enters airway, remains ABOVE vocal cords and not ejected out;Material enters airway, CONTACTS cords and not ejected out Pharyngeal- Thin Straw Reduced airway/laryngeal closure;Penetration/Aspiration during swallow;Pharyngeal residue - valleculae;Lateral channel residue;Reduced anterior laryngeal mobility Pharyngeal Material enters airway, remains ABOVE vocal cords then ejected out;Material enters airway, CONTACTS cords and not ejected out Pharyngeal- Puree Pharyngeal residue -  valleculae;Pharyngeal residue - pyriform Pharyngeal -- Pharyngeal- Mechanical Soft -- Pharyngeal -- Pharyngeal- Regular Pharyngeal residue - valleculae;Pharyngeal residue - pyriform Pharyngeal -- Pharyngeal- Multi-consistency -- Pharyngeal -- Pharyngeal- Pill Delayed swallow initiation-vallecula Pharyngeal -- Pharyngeal Comment --  CHL IP CERVICAL ESOPHAGEAL PHASE 07/10/2021 Cervical Esophageal Phase Impaired Pudding Teaspoon -- Pudding Cup -- Honey Teaspoon -- Honey Cup -- Nectar Teaspoon -- Nectar Cup -- Nectar Straw -- Thin Teaspoon -- Thin Cup Prominent cricopharyngeal segment Thin Straw Prominent cricopharyngeal segment Puree Prominent cricopharyngeal segment Mechanical Soft -- Regular Prominent cricopharyngeal segment Multi-consistency -- Pill WFL Cervical Esophageal Comment -- Sonia Baller, MA, CCC-SLP Speech Therapy             ECHOCARDIOGRAM COMPLETE  Result Date: 07/09/2021    ECHOCARDIOGRAM REPORT   Patient Name:   Vincent Robinson Date of Exam: 07/09/2021 Medical Rec #:  [id number]      Height:       67.0 in Accession #:    [id number]     Weight:       154.0 lb Date of Birth:  1923/03/06       BSA:          1.810 m Patient Age:    73 years       BP:           105/76 mmHg Patient Gender: M              HR:           85 bpm. Exam Location:  Inpatient Procedure: 2D Echo, Cardiac Doppler, Color Doppler and Intracardiac            Opacification Agent Indications:    CHF-Acute Diastolic [id number]  History:        Patient has prior history of Echocardiogram examinations, most                 recent 09/15/2020. CHF, Pulmonary HTN and COPD, Aortic Valve                 Disease, Signs/Symptoms:Hypotension; Risk Factors:Sleep Apnea,                 Former Smoker and Dyslipidemia. AAA s/p thoracic stent. Asthma.                 Hypothyroid. Chronic hypoxic respiratory failure.  Sonographer:    Merrie Roof RDCS Referring Phys: [place]  1. Left ventricular ejection fraction, by estimation, is  60 to 65%. The left ventricle has normal function. The left ventricle has no regional wall motion abnormalities. There is moderate concentric left ventricular hypertrophy. Left ventricular diastolic function could not be evaluated. There is the interventricular septum is flattened in systole and diastole, consistent with right ventricular pressure and volume overload.  2. Right ventricular systolic function is severely reduced. The right ventricular size is severely enlarged. There is severely elevated pulmonary artery systolic pressure. The estimated right ventricular systolic pressure is 99991111 mmHg.  3. Left atrial size was severely dilated.  4. Right atrial size was severely dilated.  5. A small pericardial effusion is present. The pericardial effusion is posterior to the left ventricle.  6. The mitral valve is degenerative. Mild to moderate mitral valve regurgitation. No evidence of mitral stenosis.  7. Tricuspid valve regurgitation is moderate to severe.  8. Moderate aortic stenosis. Vmax 3.6 m/s, MG 30.2 mmHG, AVA 1.11 cm2, DI 0.32. All consistent with moderate AS. The aortic valve is tricuspid. There is severe calcifcation of the aortic valve. There is severe thickening of the aortic valve. Aortic valve regurgitation is mild. Moderate aortic valve stenosis.  9. There is mild dilatation of the ascending aorta, measuring 42 mm. 10. The inferior vena cava is dilated in size with <50% respiratory variability, suggesting right atrial pressure of 15 mmHg. Comparison(s): No significant change from prior study. FINDINGS  Left Ventricle: Left ventricular ejection fraction, by estimation, is 60 to 65%. The left ventricle has normal function. The left ventricle has no regional wall motion abnormalities. Definity contrast agent was given IV to delineate the left ventricular  endocardial borders. The left ventricular internal cavity size was normal in size. There is moderate concentric left ventricular hypertrophy. The  interventricular septum is flattened in systole and diastole, consistent with right ventricular pressure and volume overload. Left ventricular diastolic function could not be evaluated due to atrial fibrillation. Left ventricular diastolic function could not be evaluated. Right Ventricle: The right ventricular size is severely enlarged. No increase in right ventricular wall thickness. Right ventricular systolic function is severely reduced. There is severely  elevated pulmonary artery systolic pressure. The tricuspid regurgitant velocity is 4.17 m/s, and with an assumed right atrial pressure of 15 mmHg, the estimated right ventricular systolic pressure is [id number] mmHg. Left Atrium: Left atrial size was severely dilated. Right Atrium: Right atrial size was severely dilated. Pericardium: A small pericardial effusion is present. The pericardial effusion is posterior to the left ventricle. Mitral Valve: The mitral valve is degenerative in appearance. Mild to moderate mitral valve regurgitation. No evidence of mitral valve stenosis. Tricuspid Valve: The tricuspid valve is grossly normal. Tricuspid valve regurgitation is moderate to severe. No evidence of tricuspid stenosis. Aortic Valve: Moderate aortic stenosis. Vmax 3.6 m/s, MG 30.2 mmHG, AVA 1.11 cm2, DI 0.32. All consistent with moderate AS. The aortic valve is tricuspid. There is severe calcifcation of the aortic valve. There is severe thickening of the aortic valve. Aortic valve regurgitation is mild. Moderate aortic stenosis is present. Aortic valve mean gradient measures 30.2 mmHg. Aortic valve peak gradient measures 52.5 mmHg. Aortic valve area, by VTI measures 1.11 cm. Pulmonic Valve: The pulmonic valve was grossly normal. Pulmonic valve regurgitation is not visualized. No evidence of pulmonic stenosis. Aorta: The aortic root is normal in size and structure. There is mild dilatation of the ascending aorta, measuring 42 mm. Venous: The inferior vena cava is dilated  in size with less than 50% respiratory variability, suggesting right atrial pressure of 15 mmHg. IAS/Shunts: The atrial septum is grossly normal.  LEFT VENTRICLE PLAX 2D LVIDd:         3.80 cm LVIDs:         2.70 cm LV PW:         1.20 cm LV IVS:        1.40 cm LVOT diam:     2.10 cm LV SV:         73 LV SV Index:   40 LVOT Area:     3.46 cm  IVC IVC diam: 2.30 cm LEFT ATRIUM           Index LA diam:      4.10 cm 2.27 cm/m LA Vol (A4C): 90.4 ml 49.95 ml/m  AORTIC VALVE AV Area (Vmax):    0.95 cm AV Area (Vmean):   0.82 cm AV Area (VTI):     1.11 cm AV Vmax:           362.25 cm/s AV Vmean:          254.500 cm/s AV VTI:            0.657 m AV Peak Grad:      52.5 mmHg AV Mean Grad:      30.2 mmHg LVOT Vmax:         99.80 cm/s LVOT Vmean:        60.400 cm/s LVOT VTI:          0.210 m LVOT/AV VTI ratio: 0.32  AORTA Ao Root diam: 4.00 cm Ao Asc diam:  4.20 cm TRICUSPID VALVE TR Peak grad:   69.6 mmHg TR Vmax:        417.00 cm/s  SHUNTS Systemic VTI:  0.21 m Systemic Diam: 2.10 cm Eleonore Chiquito MD Electronically signed by Eleonore Chiquito MD Signature Date/Time: 07/09/2021/2:43:57 PM    Final     Procedures Procedures   Medications Ordered in ED Medications  mometasone-formoterol (DULERA) 100-5 MCG/ACT inhaler 2 puff (2 puffs Inhalation Given 07/10/21 0723)  acetaminophen (TYLENOL) tablet 650 mg (has no administration in time range)  Or  acetaminophen (TYLENOL) suppository 650 mg (has no administration in time range)  sodium chloride flush (NS) 0.9 % injection 3 mL (3 mLs Intravenous Given 07/10/21 0900)  sodium chloride flush (NS) 0.9 % injection 3 mL (has no administration in time range)  0.9 %  sodium chloride infusion (0 mLs Intravenous Stopped 07/09/21 1215)  albuterol (PROVENTIL) (2.5 MG/3ML) 0.083% nebulizer solution 2.5 mg (has no administration in time range)  MEDLINE mouth rinse (15 mLs Mouth Rinse Given 07/10/21 0900)  ferrous sulfate tablet 325 mg (325 mg Oral Not Given 07/10/21 0736)   levothyroxine (SYNTHROID) tablet 88 mcg (88 mcg Oral Given 07/10/21 0635)  midodrine (PROAMATINE) tablet 10 mg (10 mg Oral Given 07/10/21 1645)  montelukast (SINGULAIR) tablet 10 mg (10 mg Oral Given 07/09/21 2159)  sildenafil (REVATIO) tablet 40 mg (40 mg Oral Not Given 07/10/21 1620)  simvastatin (ZOCOR) tablet 10 mg (10 mg Oral Given 07/10/21 1645)  macitentan (OPSUMIT) tablet 10 mg (10 mg Oral Given 07/10/21 1013)  perflutren lipid microspheres (DEFINITY) IV suspension (2 mLs Intravenous Given 07/09/21 1132)  pantoprazole (PROTONIX) EC tablet 40 mg (has no administration in time range)  methylPREDNISolone sodium succinate (SOLU-MEDROL) 125 mg/2 mL injection 125 mg (125 mg Intravenous Given 07/08/21 2101)  furosemide (LASIX) injection 20 mg (20 mg Intravenous Given 07/08/21 2144)  glucagon (human recombinant) (GLUCAGEN) injection 1 mg (1 mg Intravenous Given 07/08/21 2220)  Ampicillin-Sulbactam (UNASYN) 3 g in sodium chloride 0.9 % 100 mL IVPB (0 g Intravenous Stopped 07/09/21 0217)    ED Course  I have reviewed the triage vital signs and the nursing notes.  Pertinent labs & imaging results that were available during my care of the patient were reviewed by me and considered in my medical decision making (see chart for details).    MDM Rules/Calculators/A&P                           Patient with hypoxia and possible pneumonia.  He will be admitted to medicine Final Clinical Impression(s) / ED Diagnoses Final diagnoses:  None    Rx / DC Orders ED Discharge Orders     None        Milton Ferguson, MD 07/10/21 1733

## 2021-07-10 NOTE — Progress Notes (Signed)
PROGRESS NOTE  Vincent Robinson  DOB: [birth year]  PCP: Katherina Mires, MD FAO:[id number]  DOA: 07/08/2021  LOS: 2 days  Hospital Day: 3   Chief Complaint  Patient presents with   Shortness of Breath   Dysphagia    Brief narrative: Vincent Robinson is a 87 y.o. male from an ALF with PMH significant for COPD, CHF on 3 L oxygen at baseline. Patient presented to the ED on 07/08/2021 with concern of worsening dyspnea, lower extremity edema, oxygen level low at 81% on regular 3 L. In the ED, he was noted to have wheezing and pitting pedal edema Chest x-ray with increased interstitial markings, bilateral upper lobe and hilar vascular congestion. Admitted to hospitalist service for CHF exacerbation. See below for details  Subjective: Patient was seen and examined this morning.  Pleasant elderly Caucasian male.  Sitting up in chair.  Not in distress.  On 5 L oxygen by nasal at this time.  Patient states that his oxygen level normally runs less than 90% without having symptoms. Lives in an independent living facility, says he is active on 3 L oxygen.  Sees Dr. Haroldine Laws for CHF.  Assessment/Plan: Acute on chronic diastolic heart failure exacerbation Severe pulmonary hypertension -Presented with worsening dyspnea, lower extremity edema, chest x-ray with interstitial markings and vascular congestion, BNP elevated to 440 -Echocardiogram with EF 60 to 65%, moderate LVH, RV volume overload and pressure overload to 85 mmHg. -Home meds include Opsumit 10 mg daily, sildenafil 40 mg daily, torsemide 40 mg daily. -Continue Opsumit.  Okay to hold sildenafil this morning as his blood pressure is low. -He has gotten couple doses of IV Lasix.  With worsening creatinine this morning, I will hold further Lasix. -Net IO Since Admission: -394.15 mL [07/10/21 1019] -Continue to monitor for daily intake output, weight, blood pressure, BNP, renal function and electrolytes. Recent Labs  Lab 07/08/21 2100  07/09/21 0252 07/10/21 0403  BNP 444.5*  --   --   BUN 22 22 31*  CREATININE 1.04 1.02 1.25*  K 3.5 3.5 4.2  MG  --  2.2  --    Acute on chronic hypoxemic respiratory failure -On 3 L oxygen at baseline.  Currently he is on 5 to 6 L.  I do not think we need to target high oxygen saturation on him.  Patient states that normally his oxygen saturation is 87% to 88% without symptoms.  I communicated with RN to reduce the flow oxygen to target saturation low.  Atrial fibrillation/ prolonged QTc  -Currently self rate controlled.  Not on any AV nodal blocking agent..  Not on anticoagulation. -EKG on admission with QTC 518 ms. -Electrolyte level stable.   CAD/ dyslipidemia -Continue statin.  COPD  -No clinical signs of exacerbation, continue with as needed bronchodilator therapy.   Dysphagia  -On admission, there was mention of dysphagia.  GI consultation was called.  Patient's symptom resolved.  Also, because of his advanced age and comorbidities, endoscopic evaluation was not indicated.  Hypothyroidism -Continue Synthroid  Chronic iron deficiency anemia -Continue Protonix and iron supplement. Recent Labs    11/14/20 0409 11/15/20 0621 11/18/20 0448 03/06/21 1155 07/08/21 2100 07/09/21 0252 07/10/21 0403  HGB  --    < > 10.8* 9.4* 11.7* 12.7* 10.1*  MCV  --    < > 95.7 94.2 86.6 84.9 84.7  FERRITIN 69  --   --   --   --   --   --    < > =  values in this interval not displayed.    Mobility: PT eval obtained.  No need of rehab Code Status:   Code Status: Partial Code.  Per patient's wish -yes to CPR, no to intubation Nutritional status: Body mass index is 24.58 kg/m.     Diet:  Diet Order             Diet clear liquid Room service appropriate? Yes; Fluid consistency: Thin  Diet effective now                  DVT prophylaxis:  SCDs Start: 07/09/21 0235   Antimicrobials: None Fluid: None Consultants: None Family Communication: None at bedside  Status is:  Inpatient  Remains inpatient appropriate because: Elevated creatinine, low blood pressure, advanced age.  Monitor for another 24 hours  Dispo: The patient is from: Home              Anticipated d/c is to: Home hopefully tomorrow              Patient currently is not medically stable to d/c.   Difficult to place patient No     Infusions:   sodium chloride Stopped (07/09/21 1215)    Scheduled Meds:  ferrous sulfate  325 mg Oral Q breakfast   levothyroxine  88 mcg Oral QAC breakfast   macitentan  10 mg Oral Daily   mouth rinse  15 mL Mouth Rinse BID   midodrine  10 mg Oral TID WC   mometasone-formoterol  2 puff Inhalation BID   montelukast  10 mg Oral QHS   pantoprazole (PROTONIX) IV  40 mg Intravenous Q12H   sildenafil  40 mg Oral TID   simvastatin  10 mg Oral q1800   sodium chloride flush  3 mL Intravenous Q12H    Antimicrobials: Anti-infectives (From admission, onward)    Start     Dose/Rate Route Frequency Ordered Stop   07/09/21 0800  Ampicillin-Sulbactam (UNASYN) 3 g in sodium chloride 0.9 % 100 mL IVPB  Status:  Discontinued        3 g 200 mL/hr over 30 Minutes Intravenous Every 8 hours 07/09/21 0035 07/09/21 1045   07/08/21 2315  Ampicillin-Sulbactam (UNASYN) 3 g in sodium chloride 0.9 % 100 mL IVPB        3 g 200 mL/hr over 30 Minutes Intravenous  Once 07/08/21 2313 07/09/21 0217       PRN meds: sodium chloride, acetaminophen **OR** acetaminophen, albuterol, sodium chloride flush   Objective: Vitals:   07/10/21 0724 07/10/21 0734  BP:  94/61  Pulse:  61  Resp:    Temp:  98 F (36.7 C)  SpO2: 94%     Intake/Output Summary (Last 24 hours) at 07/10/2021 1019 Last data filed at 07/10/2021 0600 Gross per 24 hour  Intake 1014.77 ml  Output 900 ml  Net 114.77 ml   Filed Weights   07/08/21 1949 07/10/21 0440  Weight: 69.9 kg 71.2 kg   Weight change: 1.346 kg Body mass index is 24.58 kg/m.   Physical Exam: General exam: Pleasant elderly Caucasian  male.  Not in physical distress Skin: No rashes, lesions or ulcers. HEENT: Atraumatic, normocephalic, no obvious bleeding Lungs: mild crackles in the right base, no wheezing CVS: Irregular rhythm, rate controlled GI/Abd soft, nontender, nondistended, bowel sound present CNS: Alert, awake, oriented x3 Psychiatry: Mood appropriate Extremities: Pedal edema improving bilaterally  Data Review: I have personally reviewed the laboratory data and studies available.  Recent Labs  Lab  07/08/21 2100 07/09/21 0252 07/10/21 0403  WBC 11.0* 12.0* 11.5*  NEUTROABS 8.2* 10.9*  --   HGB 11.7* 12.7* 10.1*  HCT 38.1* 40.4 32.2*  MCV 86.6 84.9 84.7  PLT 285 294 240   Recent Labs  Lab 07/08/21 2100 07/09/21 0252 07/10/21 0403  NA 138 137 140  K 3.5 3.5 4.2  CL 98 97* 102  CO2 28 26 28   GLUCOSE 95 141* 120*  BUN 22 22 31*  CREATININE 1.04 1.02 1.25*  CALCIUM 8.9 8.6* 8.7*  MG  --  2.2  --   PHOS  --  4.2  --     F/u labs ordered Unresulted Labs (From admission, onward)     Start     Ordered   07/11/21 0500  CBC with Differential/Platelet  Tomorrow morning,   R       Question:  Specimen collection method  Answer:  Lab=Lab collect   07/10/21 1018   07/11/21 6468  Basic metabolic panel  Tomorrow morning,   R       Question:  Specimen collection method  Answer:  Lab=Lab collect   07/10/21 1018   07/08/21 2305  Legionella Pneumophila Serogp 1 Ur Ag  Once,   STAT        07/08/21 2304   07/08/21 2305  Expectorated Sputum Assessment w Gram Stain, Rflx to Resp Cult  Once,   R        07/08/21 2304            Signed, Terrilee Croak, MD [facility] 07/10/2021

## 2021-07-10 NOTE — Progress Notes (Signed)
Speech Language Pathology Treatment: Dysphagia  Patient Details Name: Vincent Robinson MRN: [id number] DOB: 04-11-1923 Today's Date: 07/10/2021 Time: 1350-1405 SLP Time Calculation (min) (ACUTE ONLY): 15 min  Assessment / Plan / Recommendation Clinical Impression  Patient seen later in PM for swallow tx focusing on patient education following MBS. SLP provided patient with printed still images from [place] showing pharyngeal residuals post initial and additional swallows, showing cp bar and describing its impact on his swallowing. SLP also reviewed swallow precautions and diet consistency (Dys 2 solids) that SLP is recommending patient start with. Patient  verbalized understanding of swallow precautions and stated "Im going to be more cautious from now on." When SLP discussing diet consistency, patient reported that he had already been thinking of foods he could tolerate and one of which was chicken salad which SLP acknowledged should be appropriate. Per patient, he might be discharging home tomorrow and as patient is fully cognizant of swallow impairment and precautions, SLP not recommending any follow up post discharge.   HPI HPI: Patient is a 87 y.o. male with PMH: COPD, CHF, and wears 3L of O2, bladder cancer who presented to hospital with worsening SOB, satting at 81% on baseline 3L oxygen. He reported that he had some hot soup in AM of admission and ever since that he cannot swallow anything. GI was consulted and he was subsequently diagnosed with aspiration PNA.  EGD was planned for 9/18 however was cancelled due to patient with low BP and was at high risk per anesthesia physican.      SLP Plan  Continue with current plan of care      Recommendations for follow up therapy are one component of a multi-disciplinary discharge planning process, led by the attending physician.  Recommendations may be updated based on patient status, additional functional criteria and insurance authorization.     Recommendations  Diet recommendations: Dysphagia 2 (fine chop);Thin liquid Liquids provided via: Straw;Cup Medication Administration: Whole meds with puree Supervision: Patient able to self feed Compensations: Slow rate;Small sips/bites;Minimize environmental distractions;Multiple dry swallows after each bite/sip Postural Changes and/or Swallow Maneuvers: Seated upright 90 degrees                Oral Care Recommendations: Oral care BID Follow up Recommendations: None SLP Visit Diagnosis: Dysphagia, oropharyngeal phase (R13.12) Plan: Continue with current plan of care       Sonia Baller, MA, CCC-SLP Speech Therapy

## 2021-07-10 NOTE — Progress Notes (Signed)
[facility] Progress Note  Vincent Robinson 87 y.o. 1923/07/30  CC:  Dysphagia, aspiration pneumonia, multiple co morbidities   Subjective: Patient sitting comfortably on oxygen, continues to have some shortness of breath, no chest pain.  Patient on clear liquids, denying any dysphagia.  Patient states feels pharyngeal restriction, some coughing after food, has been for years. Denies nausea, vomiting, melena, regurgitation of foods.  States he has had more loose stools recently with change in medications otherwise no bowel movement since patient's been in the hospital. Has had weight loss however patient's wife passed in June, states he has had decreased and p.o. intake since that time.  Just recently moved to assisted living where he has been eating more and has gained some of that weight back.  ROS : Review of Systems  Constitutional:  Positive for weight loss. Negative for chills, fever and malaise/fatigue.  Respiratory:  Positive for cough, sputum production and shortness of breath.   Cardiovascular:  Negative for chest pain.  Gastrointestinal:  Negative for abdominal pain, blood in stool, constipation, diarrhea, heartburn, melena, nausea and vomiting.  Neurological:  Negative for loss of consciousness.  Psychiatric/Behavioral:  Negative for depression.      Objective: Vital signs in last 24 hours: Vitals:   07/10/21 0734 07/10/21 1133  BP: 94/61 (!) 103/57  Pulse: 61 (!) 50  Resp:    Temp: 98 F (36.7 C)   SpO2:      Physical Exam: Physical Exam Constitutional:      General: He is not in acute distress.    Appearance: He is well-developed. He is not ill-appearing.     Comments: Elderly male sitting in chair comfortably.  Abdominal:     General: Bowel sounds are normal.     Palpations: Abdomen is soft.     Tenderness: There is no abdominal tenderness. There is no guarding or rebound.  Neurological:     Mental Status: He is alert.     Lab Results: Recent  Labs    07/09/21 0252 07/10/21 0403  NA 137 140  K 3.5 4.2  CL 97* 102  CO2 26 28  GLUCOSE 141* 120*  BUN 22 31*  CREATININE 1.02 1.25*  CALCIUM 8.6* 8.7*  MG 2.2  --   PHOS 4.2  --    Recent Labs    07/08/21 2100 07/09/21 0252  AST 28 29  ALT 17 18  ALKPHOS 91 89  BILITOT 1.0 1.2  PROT 8.8* 8.2*  ALBUMIN 4.2 3.9   Recent Labs    07/08/21 2100 07/09/21 0252 07/10/21 0403  WBC 11.0* 12.0* 11.5*  NEUTROABS 8.2* 10.9*  --   HGB 11.7* 12.7* 10.1*  HCT 38.1* 40.4 32.2*  MCV 86.6 84.9 84.7  PLT 285 294 240   No results for input(s): LABPROT, INR in the last 72 hours.  Lab Results: Results for orders placed or performed during the hospital encounter of 07/08/21 (from the past 48 hour(s))  CBC with Differential/Platelet     Status: Abnormal   Collection Time: 07/08/21  9:00 PM  Result Value Ref Range   WBC 11.0 (H) 4.0 - 10.5 K/uL   RBC 4.40 4.22 - 5.81 MIL/uL   Hemoglobin 11.7 (L) 13.0 - 17.0 g/dL   HCT 38.1 (L) 39.0 - 52.0 %   MCV 86.6 80.0 - 100.0 fL   MCH 26.6 26.0 - 34.0 pg   MCHC 30.7 30.0 - 36.0 g/dL   RDW Not Measured 11.5 - 15.5 %  Platelets 285 150 - 400 K/uL   nRBC 0.0 0.0 - 0.2 %   Neutrophils Relative % 73 %   Neutro Abs 8.2 (H) 1.7 - 7.7 K/uL   Lymphocytes Relative 17 %   Lymphs Abs 1.8 0.7 - 4.0 K/uL   Monocytes Relative 7 %   Monocytes Absolute 0.8 0.1 - 1.0 K/uL   Eosinophils Relative 1 %   Eosinophils Absolute 0.1 0.0 - 0.5 K/uL   Basophils Relative 1 %   Basophils Absolute 0.1 0.0 - 0.1 K/uL   Immature Granulocytes 1 %   Abs Immature Granulocytes 0.05 0.00 - 0.07 K/uL   Reactive, Benign Lymphocytes PRESENT    Target Cells PRESENT     Comment: Performed at [facility], [place] [address] [place], [place] [address]  Comprehensive metabolic panel     Status: Abnormal   Collection Time: 07/08/21  9:00 PM  Result Value Ref Range   Sodium 138 135 - 145 mmol/L   Potassium 3.5 3.5 - 5.1 mmol/L   Chloride 98 98 - 111  mmol/L   CO2 28 22 - 32 mmol/L   Glucose, Bld 95 70 - 99 mg/dL    Comment: Glucose reference range applies only to samples taken after fasting for at least 8 hours.   BUN 22 8 - 23 mg/dL   Creatinine, Ser 1.04 0.61 - 1.24 mg/dL   Calcium 8.9 8.9 - 10.3 mg/dL   Total Protein 8.8 (H) 6.5 - 8.1 g/dL   Albumin 4.2 3.5 - 5.0 g/dL   AST 28 15 - 41 U/L   ALT 17 0 - 44 U/L   Alkaline Phosphatase 91 38 - 126 U/L   Total Bilirubin 1.0 0.3 - 1.2 mg/dL   GFR, Estimated >60 >60 mL/min    Comment: (NOTE) Calculated using the CKD-EPI Creatinine Equation (2021)    Anion gap 12 5 - 15    Comment: Performed at [facility], [place] [address], [place], [place] [address]  Brain natriuretic peptide     Status: Abnormal   Collection Time: 07/08/21  9:00 PM  Result Value Ref Range   B Natriuretic Peptide 444.5 (H) 0.0 - 100.0 pg/mL    Comment: Performed at [facility], [place] [address], [place],  [address]  Resp Panel by RT-PCR (Flu A&B, Covid) Nasopharyngeal Swab     Status: None   Collection Time: 07/08/21  9:21 PM   Specimen: Nasopharyngeal Swab; Nasopharyngeal(NP) swabs in vial transport medium  Result Value Ref Range   SARS Coronavirus 2 by RT PCR NEGATIVE NEGATIVE    Comment: (NOTE) SARS-CoV-2 target nucleic acids are NOT DETECTED.  The SARS-CoV-2 RNA is generally detectable in upper respiratory specimens during the acute phase of infection. The lowest concentration of SARS-CoV-2 viral copies this assay can detect is 138 copies/mL. A negative result does not preclude SARS-Cov-2 infection and should not be used as the sole basis for treatment or other patient management decisions. A negative result may occur with  improper specimen collection/handling, submission of specimen other than nasopharyngeal swab, presence of viral mutation(s) within the areas targeted by this assay, and inadequate number of viral copies(<138 copies/mL). A negative result must  be combined with clinical observations, patient history, and epidemiological information. The expected result is Negative.  Fact Sheet for Patients:  EntrepreneurPulse.com.au  Fact Sheet for Healthcare Providers:  IncredibleEmployment.be  This test is no t yet approved or cleared by the [place] FDA and  has been  authorized for detection and/or diagnosis of SARS-CoV-2 by FDA under an Emergency Use Authorization (EUA). This EUA will remain  in effect (meaning this test can be used) for the duration of the COVID-19 declaration under Section 564(b)(1) of the Act, 21 U.S.C.section 360bbb-3(b)(1), unless the authorization is terminated  or revoked sooner.       Influenza A by PCR NEGATIVE NEGATIVE   Influenza B by PCR NEGATIVE NEGATIVE    Comment: (NOTE) The Xpert Xpress SARS-CoV-2/FLU/RSV plus assay is intended as an aid in the diagnosis of influenza from Nasopharyngeal swab specimens and should not be used as a sole basis for treatment. Nasal washings and aspirates are unacceptable for Xpert Xpress SARS-CoV-2/FLU/RSV testing.  Fact Sheet for Patients: EntrepreneurPulse.com.au  Fact Sheet for Healthcare Providers: IncredibleEmployment.be  This test is not yet approved or cleared by the [place] FDA and has been authorized for detection and/or diagnosis of SARS-CoV-2 by FDA under an Emergency Use Authorization (EUA). This EUA will remain in effect (meaning this test can be used) for the duration of the COVID-19 declaration under Section 564(b)(1) of the Act, 21 U.S.C. section 360bbb-3(b)(1), unless the authorization is terminated or revoked.  Performed at [facility], [place] [address] [place], [place] [address]   Troponin I (High Sensitivity)     Status: Abnormal   Collection Time: 07/08/21 11:53 PM  Result Value Ref Range   Troponin I (High Sensitivity) 24 (H) <18 ng/L     Comment: (NOTE) Elevated high sensitivity troponin I (hsTnI) values and significant  changes across serial measurements may suggest ACS but many other  chronic and acute conditions are known to elevate hsTnI results.  Refer to the "Links" section for chest pain algorithms and additional  guidance. Performed at [facility], [place] [address] [place], [place] [address]   Strep pneumoniae urinary antigen     Status: None   Collection Time: 07/08/21 11:54 PM  Result Value Ref Range   Strep Pneumo Urinary Antigen NEGATIVE NEGATIVE    Comment:        Infection due to S. pneumoniae cannot be absolutely ruled out since the antigen present may be below the detection limit of the test. Performed at [facility], [address], [place],  [address]   Urinalysis, Routine w reflex microscopic     Status: Abnormal   Collection Time: 07/09/21  1:07 AM  Result Value Ref Range   Color, Urine YELLOW (A) YELLOW   APPearance CLEAR (A) CLEAR   Specific Gravity, Urine 1.010 1.005 - 1.030   pH 7.0 5.0 - 8.0   Glucose, UA NEGATIVE NEGATIVE mg/dL   Hgb urine dipstick NEGATIVE NEGATIVE   Bilirubin Urine NEGATIVE NEGATIVE   Ketones, ur NEGATIVE NEGATIVE mg/dL   Protein, ur NEGATIVE NEGATIVE mg/dL   Nitrite NEGATIVE NEGATIVE   Leukocytes,Ua NEGATIVE NEGATIVE   RBC / HPF 0-5 0 - 5 RBC/hpf   WBC, UA 0-5 0 - 5 WBC/hpf   Bacteria, UA NONE SEEN NONE SEEN   Squamous Epithelial / LPF 0-5 0 - 5    Comment: Performed at [facility], [place] [address], [place], [place] [address]  Troponin I (High Sensitivity)     Status: Abnormal   Collection Time: 07/09/21  2:52 AM  Result Value Ref Range   Troponin I (High Sensitivity) 21 (H) <18 ng/L    Comment: (NOTE) Elevated high sensitivity troponin I (hsTnI) values and significant  changes across serial measurements may suggest  ACS but many other  chronic and acute conditions are known to elevate hsTnI results.  Refer  to the "Links" section for chest pain algorithms and additional  guidance. Performed at [facility], [place] [address], [place], [place] [address]   Magnesium     Status: None   Collection Time: 07/09/21  2:52 AM  Result Value Ref Range   Magnesium 2.2 1.7 - 2.4 mg/dL    Comment: Performed at [facility], [place] [address] [place], [place] [address]  Phosphorus     Status: None   Collection Time: 07/09/21  2:52 AM  Result Value Ref Range   Phosphorus 4.2 2.5 - 4.6 mg/dL    Comment: Performed at [facility], [place] [address] [place], [place] [address]  CBC WITH DIFFERENTIAL     Status: Abnormal   Collection Time: 07/09/21  2:52 AM  Result Value Ref Range   WBC 12.0 (H) 4.0 - 10.5 K/uL   RBC 4.76 4.22 - 5.81 MIL/uL   Hemoglobin 12.7 (L) 13.0 - 17.0 g/dL   HCT 40.4 39.0 - 52.0 %   MCV 84.9 80.0 - 100.0 fL   MCH 26.7 26.0 - 34.0 pg   MCHC 31.4 30.0 - 36.0 g/dL   RDW Not Measured 11.5 - 15.5 %   Platelets 294 150 - 400 K/uL   nRBC 0.0 0.0 - 0.2 %   Neutrophils Relative % 90 %   Neutro Abs 10.9 (H) 1.7 - 7.7 K/uL   Lymphocytes Relative 8 %   Lymphs Abs 0.9 0.7 - 4.0 K/uL   Monocytes Relative 1 %   Monocytes Absolute 0.1 0.1 - 1.0 K/uL   Eosinophils Relative 0 %   Eosinophils Absolute 0.0 0.0 - 0.5 K/uL   Basophils Relative 0 %   Basophils Absolute 0.0 0.0 - 0.1 K/uL   Immature Granulocytes 1 %   Abs Immature Granulocytes 0.07 0.00 - 0.07 K/uL   Target Cells PRESENT     Comment: Performed at [facility], [place] [address] [place], [place] [address]  TSH     Status: None   Collection Time: 07/09/21  2:52 AM  Result Value Ref Range   TSH 1.730 0.350 - 4.500 uIU/mL    Comment: Performed by a 3rd Generation assay with a functional sensitivity of <=0.01 uIU/mL. Performed at [facility], [place] [address], [place], [place] [address]   Comprehensive metabolic panel     Status: Abnormal    Collection Time: 07/09/21  2:52 AM  Result Value Ref Range   Sodium 137 135 - 145 mmol/L   Potassium 3.5 3.5 - 5.1 mmol/L   Chloride 97 (L) 98 - 111 mmol/L   CO2 26 22 - 32 mmol/L   Glucose, Bld 141 (H) 70 - 99 mg/dL    Comment: Glucose reference range applies only to samples taken after fasting for at least 8 hours.   BUN 22 8 - 23 mg/dL   Creatinine, Ser 1.02 0.61 - 1.24 mg/dL   Calcium 8.6 (L) 8.9 - 10.3 mg/dL   Total Protein 8.2 (H) 6.5 - 8.1 g/dL   Albumin 3.9 3.5 - 5.0 g/dL   AST 29 15 - 41 U/L   ALT 18 0 - 44 U/L   Alkaline Phosphatase 89 38 - 126 U/L   Total Bilirubin 1.2 0.3 - 1.2 mg/dL   GFR, Estimated >60 >60 mL/min    Comment: (NOTE) Calculated using the CKD-EPI Creatinine Equation (  2021)    Anion gap 14 5 - 15    Comment: Performed at [facility], [place] [address] [place], [place] [address]  Troponin I (High Sensitivity)     Status: Abnormal   Collection Time: 07/09/21  5:15 AM  Result Value Ref Range   Troponin I (High Sensitivity) 24 (H) <18 ng/L    Comment: (NOTE) Elevated high sensitivity troponin I (hsTnI) values and significant  changes across serial measurements may suggest ACS but many other  chronic and acute conditions are known to elevate hsTnI results.  Refer to the "Links" section for chest pain algorithms and additional  guidance. Performed at [facility], [place] [address], [place], [place] [id number]   Basic metabolic panel     Status: Abnormal   Collection Time: 07/10/21  4:03 AM  Result Value Ref Range   Sodium 140 135 - 145 mmol/L   Potassium 4.2 3.5 - 5.1 mmol/L   Chloride 102 98 - 111 mmol/L   CO2 28 22 - 32 mmol/L   Glucose, Bld 120 (H) 70 - 99 mg/dL    Comment: Glucose reference range applies only to samples taken after fasting for at least 8 hours.   BUN 31 (H) 8 - 23 mg/dL   Creatinine, Ser 1.25 (H) 0.61 - 1.24 mg/dL   Calcium 8.7 (L) 8.9 - 10.3 mg/dL   GFR, Estimated 53 (L) >60 mL/min     Comment: (NOTE) Calculated using the CKD-EPI Creatinine Equation (2021)    Anion gap 10 5 - 15    Comment: Performed at [facility], [place] [address], [place], [place] [address]  CBC     Status: Abnormal   Collection Time: 07/10/21  4:03 AM  Result Value Ref Range   WBC 11.5 (H) 4.0 - 10.5 K/uL   RBC 3.80 (L) 4.22 - 5.81 MIL/uL   Hemoglobin 10.1 (L) 13.0 - 17.0 g/dL   HCT 32.2 (L) 39.0 - 52.0 %   MCV 84.7 80.0 - 100.0 fL   MCH 26.6 26.0 - 34.0 pg   MCHC 31.4 30.0 - 36.0 g/dL   RDW Not Measured 11.5 - 15.5 %   Platelets 240 150 - 400 K/uL   nRBC 0.0 0.0 - 0.2 %    Comment: Performed at [facility], [place] [address], [place], [place] [address]    Assessment/Plan: Dysphagia and 87 year old patient with multiple medical "morbidities, currently here for aspiration pneumonia with hypoxic respiratory failure on 5 L of oxygen, heart failure and COPD. Patient denies any dysphagia or food stuck in his esophagus at this time. After further discussion with the patient seems to be more oropharyngeal in nature with coughing after eating. Agree with current order for modified barium swallow at this time with speech therapy If patient continues to have dysphagia with solid foods after advancement of her diet can consider endoscopy at that time, however due to patient's multiple comorbidities and symptoms do not feel an endoscopy would be beneficial   Vladimir Crofts PA-C 07/10/2021, 11:36 AM  Contact #  [phone]

## 2021-07-10 NOTE — Progress Notes (Signed)
Modified Barium Swallow Progress Note  Patient Details  Name: FREDRIK MOGEL MRN: [id number] Date of Birth: 12-Jun-1923  Today's Date: 07/10/2021  Modified Barium Swallow completed.  Full report located under Chart Review in the Imaging Section.  Brief recommendations include the following:  Clinical Impression  Patient presents with a mild oral dysphagia, mild-moderate pharyngeal phase dysphagia in addition to cervical esophageal component from prominent cricopharyngeal bar. During oral phase, patient exhibited a mild delay in anterior to posterior transit of puree and regular solids boluses and significant difficulty with bolus cohesion when taking 1/2 barium tablet with water(required several sips of water to transit). During pharyngeal phase, patient exhibited decreased hyolaryngeal movement and reduced laryngeal/airway closure. With sips of thin liquid barium, he exhibited flash penetration (PAS 2), penetration above vocal cords that did not clear (PAS 3) and penetration to vocal cords that did not clear.(PAS 5). When he was drinking regular water with barium tablet, he had excessive coughing response and suspect he did have an incident of aspiration. With puree solids and regular solids, patient exhibited mild-mod vallecular residuals and trace pyriform residuals, with all residuals clearing after several subsequent dry swallows. Chin tuck posture with thin liquids did not prevent penetration of barium into laryngeal vestibule but did not appear to increase aspiration risk. Prominent cricopharyngeal bar observed, resulting in narrowing of upper esophagus and likely contributing to pharyngeal residuals.  SLP is recommending upgrade of patient's PO diet to Dys 2 solids, thin liquids with aspiration precautions.   Swallow Evaluation Recommendations       SLP Diet Recommendations: Dysphagia 2 (Fine chop) solids;Thin liquid   Liquid Administration via: Cup;Straw   Medication Administration: Whole  meds with puree   Supervision: Patient able to self feed;Intermittent supervision to cue for compensatory strategies   Compensations: Slow rate;Small sips/bites;Minimize environmental distractions;Multiple dry swallows after each bite/sip   Postural Changes: Seated upright at 90 degrees   Oral Care Recommendations: Oral care BID;Patient independent with oral care        Sonia Baller, MA, CCC-SLP Speech Therapy

## 2021-07-11 LAB — BASIC METABOLIC PANEL
Anion gap: 10 (ref 5–15)
BUN: 33 mg/dL — ABNORMAL HIGH (ref 8–23)
CO2: 29 mmol/L (ref 22–32)
Calcium: 8.3 mg/dL — ABNORMAL LOW (ref 8.9–10.3)
Chloride: 101 mmol/L (ref 98–111)
Creatinine, Ser: 0.99 mg/dL (ref 0.61–1.24)
GFR, Estimated: >60 mL/min (ref >60)
Glucose, Bld: 93 mg/dL (ref 70–99)
Potassium: 3.9 mmol/L (ref 3.5–5.1)
Sodium: 140 mmol/L (ref 135–145)

## 2021-07-11 LAB — CBC WITH DIFFERENTIAL/PLATELET
Abs Immature Granulocytes: 0.05 10*3/uL (ref 0.00–0.07)
Basophils Absolute: 0 10*3/uL (ref 0.0–0.1)
Basophils Relative: 0 %
Eosinophils Absolute: 0.1 10*3/uL (ref 0.0–0.5)
Eosinophils Relative: 1 %
HCT: 32.5 % — ABNORMAL LOW (ref 39.0–52.0)
Hemoglobin: 10.1 g/dL — ABNORMAL LOW (ref 13.0–17.0)
Immature Granulocytes: 1 %
Lymphocytes Relative: 27 %
Lymphs Abs: 2.7 10*3/uL (ref 0.7–4.0)
MCH: 26.6 pg (ref 26.0–34.0)
MCHC: 31.1 g/dL (ref 30.0–36.0)
MCV: 85.5 fL (ref 80.0–100.0)
Monocytes Absolute: 1 10*3/uL (ref 0.1–1.0)
Monocytes Relative: 10 %
Neutro Abs: 6.3 10*3/uL (ref 1.7–7.7)
Neutrophils Relative %: 61 %
Platelets: 234 10*3/uL (ref 150–400)
RBC: 3.8 MIL/uL — ABNORMAL LOW (ref 4.22–5.81)
WBC: 10.2 10*3/uL (ref 4.0–10.5)
nRBC: 0 % (ref 0.0–0.2)

## 2021-07-11 NOTE — Progress Notes (Signed)
Mobility Specialist - Refusal Note    07/11/21 1234  Mobility  Activity Refused mobility    Pt awaiting discharge and is refusing mobility at this time.   [place] Specialist Acute Rehabilitation Services Phone: [phone] 07/11/21, 12:35 PM

## 2021-07-11 NOTE — Care Management Important Message (Signed)
Important Message  Patient Details IM Letter placed in Patient's room for Daughter [name]. Name: Vincent Robinson MRN: [id number] Date of Birth: Aug 19, 1923   Medicare Important Message Given:  Yes     [name] 07/11/2021, 10:26 AM

## 2021-07-11 NOTE — TOC Transition Note (Signed)
Transition of Care [facility]) - CM/SW Discharge Note   Patient Details  Name: BENETT SWOYER MRN: [id number] Date of Birth: [birth year]  Transition of Care [facility]) CM/SW Contact:  Dessa Phi, RN Phone Number: 07/11/2021, 11:50 AM   Clinical Narrative:  d/c home w/HHPT-TC [facility], & dtr [place]-HHPT [facility] in house @ the [facility]. Faxed [place] orders w/confirmation to [employer].Family to transport home on own. No further CM needs.    Final next level of care: [place] Barriers to Discharge: No Barriers Identified   Patient Goals and CMS Choice Patient states their goals for this hospitalization and ongoing recovery are:: go home CMS Medicare.gov Compare Post Acute Care list provided to:: Patient Represenative (must comment) (dtr Sharon [phone]) Choice offered to / list presented to : Adult Children  Discharge Placement                       Discharge Plan and Services   Discharge Planning Services: CM Consult Post Acute Care Choice: Home Health                    HH Arranged: PT [place] Agency: Other - See comment Secondary school teacher) Date HH Agency Contacted: 07/11/21 Time [place]: 1149 Representative spoke with at [place]: [place] ([place]) Interventions     Readmission Risk Interventions Readmission Risk Prevention Plan 09/19/2020 03/25/2020 11/24/2019  Transportation Screening Complete Complete Complete  PCP or Specialist Appt within 5-7 Days - - Patient refused  Home Care Screening - - Complete  Medication Review (RN CM) - - Referral to Pharmacy  [place] or Home Care Consult Complete Patient refused -  Social Work Consult for [place] Planning/Counseling Complete - -  [place] Screening Complete Not Applicable -  Medication Review (RN Care Manager) Complete Complete -  Some recent data might be hidden

## 2021-07-11 NOTE — Progress Notes (Signed)
PT refuses to be placed on standard [place] tubing. States he uses green tubing at home and will wear it here.

## 2021-07-11 NOTE — Discharge Summary (Signed)
Physician Discharge Summary  Vincent Robinson EYC:[id number] DOB: [birth year] DOA: 07/08/2021  PCP: Katherina Mires, MD  Admit date: 07/08/2021 Discharge date: 07/11/2021  Admitted From: Independent living facility Discharge disposition: Back to independent living facility with home health PT   Code Status: Partial Code   Discharge Diagnosis:   Principal Problem:   Acute on chronic diastolic CHF (congestive heart failure) ([place] [place]) Active Problems:   COPD (chronic obstructive pulmonary disease) ([place])   ILD (interstitial lung disease) ([place])   Hypothyroidism   Hyperlipidemia   Coronary artery disease   Pulmonary hypertension, unspecified (HCC)   Atrial fibrillation ([place])   Acute on chronic respiratory failure ([place])   CHF exacerbation ([place])   Esophageal obstruction due to food impaction   Prolonged QT interval    Chief Complaint  Patient presents with   Shortness of Breath   Dysphagia    Brief narrative: Vincent Robinson is a 87 y.o. male from an ALF with PMH significant for COPD, CHF on 3 L oxygen at baseline. Patient presented to the ED on 07/08/2021 with concern of worsening dyspnea, lower extremity edema, oxygen level low at 81% on regular 3 L. In the ED, he was noted to have wheezing and pitting pedal edema Chest x-ray with increased interstitial markings, bilateral upper lobe and hilar vascular congestion. Admitted to hospitalist service for CHF exacerbation. See below for details.  Subjective: Patient was seen and examined this morning.   Pleasant elderly Caucasian male.  Sitting up in bed.  Not in distress.  On 3 L oxygen which is his home requirement.    Hospital course: Acute on chronic diastolic heart failure exacerbation Severe pulmonary hypertension -Presented with worsening dyspnea, lower extremity edema, chest x-ray with interstitial markings and vascular congestion, BNP elevated to 440 -Echocardiogram showed EF 60 to 65%, moderate LVH, RV volume overload and  pressure overload to 85 mmHg. -Home meds include Opsumit 10 mg daily, sildenafil 40 mg daily, torsemide 40 mg daily. -He was given few doses of Lasix IV in the hospital.  Breathing improved.  Creatinine stable. -Plan to discharge on his previous home regimen. -Continue to follow-up with Dr. Haroldine Laws as an outpatient.   -Net IO Since Admission: -1,474.15 mL [07/11/21 1104] -Continue to monitor for daily intake output, weight, blood pressure, BNP, renal function and electrolytes. Recent Labs  Lab 07/08/21 2100 07/09/21 0252 07/10/21 0403 07/11/21 0456  BNP 444.5*  --   --   --   BUN 22 22 31* 33*  CREATININE 1.04 1.02 1.25* 0.99  K 3.5 3.5 4.2 3.9  MG  --  2.2  --   --    Acute on chronic hypoxemic respiratory failure -On 3 L oxygen at baseline.  Initially required high flow oxygen.  Currently at baseline.  Atrial fibrillation/ prolonged QTc  -Currently self rate controlled.  Not on any AV nodal blocking agent..  Not on anticoagulation. -EKG on admission with QTC 518 ms. -Electrolyte level stable.   CAD/ dyslipidemia -Continue statin.  COPD  -No clinical signs of exacerbation, continue with as needed bronchodilator therapy.   Dysphagia  -On admission, there was mention of dysphagia.  GI consultation was called.  Patient's symptom resolved.  Also, because of his advanced age and comorbidities, endoscopic evaluation was not indicated.  s/p cholecystostomy bag -Per patient, he has it for last 2 to 3 years.  Follows up every few months to change the bag.  Hypothyroidism -Continue Synthroid  Chronic iron deficiency anemia -Continue Protonix and  iron supplement. Recent Labs    11/14/20 0409 11/15/20 0621 03/06/21 1155 07/08/21 2100 07/09/21 0252 07/10/21 0403 07/11/21 0456  HGB  --    < > 9.4* 11.7* 12.7* 10.1* 10.1*  MCV  --    < > 94.2 86.6 84.9 84.7 85.5  FERRITIN 69  --   --   --   --   --   --    < > = values in this interval not displayed.     Allergies as of  07/11/2021       Reactions   Hydrocodone Other (See Comments)        Medication List     TAKE these medications    albuterol 108 (90 Base) MCG/ACT inhaler Commonly known as: VENTOLIN HFA Inhale 1 puff into the lungs every 6 (six) hours as needed for wheezing or shortness of breath.   DULERA IN Inhale 1 puff into the lungs 2 (two) times daily as needed (shortness of breath).   ferrous sulfate 325 (65 FE) MG tablet Take 325 mg by mouth daily with breakfast.   levothyroxine 88 MCG tablet Commonly known as: SYNTHROID Take 88 mcg by mouth daily before breakfast.   midodrine 10 MG tablet Commonly known as: PROAMATINE Take 1 tablet (10 mg total) by mouth 3 (three) times daily after meals. In addition with the 5mg  tablets What changed:  when to take this additional instructions   midodrine 5 MG tablet Commonly known as: PROAMATINE Take 1 tablet (5 mg total) by mouth 3 (three) times daily with meals. In addition with the 10mg  tablets What changed:  when to take this additional instructions   montelukast 10 MG tablet Commonly known as: SINGULAIR Take 10 mg by mouth at bedtime.   Normal Saline Flush 0.9 % Soln USE 5 MLS 2 TIMES DAILY AS DIRECTED What changed:  how much to take when to take this additional instructions   Opsumit 10 MG tablet Generic drug: macitentan Take 1 tablet (10 mg total) by mouth daily. Needs appt for future refills What changed: additional instructions   pantoprazole 20 MG tablet Commonly known as: PROTONIX Take 20 mg by mouth every morning.   potassium chloride SA 20 MEQ tablet Commonly known as: KLOR-CON Take 20 mEq by mouth in the morning and at bedtime.   sildenafil 20 MG tablet Commonly known as: REVATIO TAKE TWO TABLETS BY MOUTH THREE TIMES A DAY What changed: See the new instructions.   simvastatin 10 MG tablet Commonly known as: ZOCOR Take 10 mg by mouth at bedtime.   torsemide 20 MG tablet Commonly known as: DEMADEX TAKE  TWO TABLETS BY MOUTH DAILY What changed: when to take this   Vitamin D (Ergocalciferol) 1.25 MG (50000 UNIT) Caps capsule Commonly known as: DRISDOL Take 50,000 Units by mouth every Sunday.        Discharge Instructions:  Diet Recommendation:  Discharge Diet Orders (From admission, onward)     Start     Ordered   07/11/21 0000  Diet - low sodium heart healthy        09 /20/22 1104              Follow with Primary MD Katherina Mires, MD in 7 days   Get CBC/BMP checked in next visit within 1 week by PCP or SNF MD ( we routinely change or add medications that can affect your baseline labs and fluid status, therefore we recommend that you get the mentioned basic workup next visit  with your PCP, your PCP may decide not to get them or add new tests based on their clinical decision)  On your next visit with your PCP, please Get Medicines reviewed and adjusted.  Please request your PCP  to go over all Hospital Tests and Procedure/Radiological results at the follow up, please get all Hospital records sent to your Prim MD by signing hospital release before you go home.  Activity: As tolerated with Full fall precautions use walker/cane & assistance as needed  For Heart failure patients - Check your Weight same time everyday, if you gain over 2 pounds, or you develop in leg swelling, experience more shortness of breath or chest pain, call your Primary MD immediately. Follow Cardiac Low Salt Diet and 1.5 lit/day fluid restriction.  If you have smoked or chewed Tobacco in the last 2 yrs please stop smoking, stop any regular Alcohol  and or any Recreational drug use.  If you experience worsening of your admission symptoms, develop shortness of breath, life threatening emergency, suicidal or homicidal thoughts you must seek medical attention immediately by calling 911 or calling your MD immediately  if symptoms less severe.  You Must read complete instructions/literature along with all the  possible adverse reactions/side effects for all the Medicines you take and that have been prescribed to you. Take any new Medicines after you have completely understood and accpet all the possible adverse reactions/side effects.   Do not drive, operate heavy machinery, perform activities at heights, swimming or participation in water activities or provide baby sitting services if your were admitted for syncope or siezures until you have seen by Primary MD or a Neurologist and advised to do so again.  Do not drive when taking Pain medications.  Do not take more than prescribed Pain, Sleep and Anxiety Medications  Wear Seat belts while driving.   Please note You were cared for by a hospitalist during your hospital stay. If you have any questions about your discharge medications or the care you received while you were in the hospital after you are discharged, you can call the unit and asked to speak with the hospitalist on call if the hospitalist that took care of you is not available. Once you are discharged, your primary care physician will handle any further medical issues. Please note that NO REFILLS for any discharge medications will be authorized once you are discharged, as it is imperative that you return to your primary care physician (or establish a relationship with a primary care physician if you do not have one) for your aftercare needs so that they can reassess your need for medications and monitor your lab values.    Follow ups:    Follow-up [facility] up.   Contact information: [address] [place] [address] [phone]                Wound care:     Discharge Exam:   Vitals:   07/11/21 0500 07/11/21 0500 07/11/21 0756 07/11/21 0949  BP:  98/66  (!) 90/50  Pulse:  70    Resp:  20    Temp:  (!) 97.5 F (36.4 C)    TempSrc:  Oral    SpO2:  90% 96%   Weight: 67.2 kg     Height:         Body mass index is 23.2 kg/m.  General exam: Pleasant, elderly Caucasian male.  Not in  distress. Skin: No rashes, lesions or ulcers. HEENT: Atraumatic, normocephalic, no obvious bleeding Lungs: Clear to auscultation bilaterally CVS: Regular rate and rhythm, ejection systolic murmur GI/Abd soft, nontender, nondistended, bowel sound present.  Has a cholecystostomy bag with biliary content. CNS: Alert, awake, oriented x3 Psychiatry: Mood appropriate Extremities: No pedal edema, no calf tenderness  Time coordinating discharge: 35 minutes   The results of significant diagnostics from this hospitalization (including imaging, microbiology, ancillary and laboratory) are listed below for reference.    Procedures and Diagnostic Studies:   DG Chest Port 1 View  Result Date: 07/08/2021 CLINICAL DATA:  Shortness of breath. Hx of AAA, asthma, CHF, COPD, CAD, MI, pna, pulm HTN, CABG. EXAM: PORTABLE CHEST 1 VIEW COMPARISON:  Chest x-ray 11/13/2020, CT chest 11/14/2020. FINDINGS: Cardiomegaly. The heart and mediastinal contours are unchanged. Prominence of the perihilar regions again noted. Aortic calcification. Bilateral lung apices with limited evaluation due to overlying mandible. Right costophrenic angle scarring/atelectasis. No focal consolidation. Increased interstitial markings. No pleural effusion. No pneumothorax. No acute osseous abnormality. IMPRESSION: 1. Cardiomegaly with pulmonary edema. 2. Bilateral lung apices with limited evaluation due to overlying mandible. Electronically Signed   By: Iven Finn M.D.   On: 07/08/2021 20:44   ECHOCARDIOGRAM COMPLETE  Result Date: 07/09/2021    ECHOCARDIOGRAM REPORT   Patient Name:   Vincent Robinson Date of Exam: 07/09/2021 Medical Rec #:  [id number]      Height:       67.0 in Accession #:    [id number]     Weight:       154.0 lb Date of Birth:  1923-08-29       BSA:          1.810 m Patient Age:    42 years       BP:           105/76 mmHg Patient  Gender: M              HR:           85 bpm. Exam Location:  Inpatient Procedure: 2D Echo, Cardiac Doppler, Color Doppler and Intracardiac            Opacification Agent Indications:    CHF-Acute Diastolic B93.90  History:        Patient has prior history of Echocardiogram examinations, most                 recent 09/15/2020. CHF, Pulmonary HTN and COPD, Aortic Valve                 Disease, Signs/Symptoms:Hypotension; Risk Factors:Sleep Apnea,                 Former Smoker and Dyslipidemia. AAA s/p thoracic stent. Asthma.                 Hypothyroid. Chronic hypoxic respiratory failure.  Sonographer:    Merrie Roof RDCS Referring Phys: [place]  1. Left ventricular ejection fraction, by estimation, is 60 to 65%. The left ventricle has normal function. The left ventricle has no regional wall motion abnormalities. There is moderate concentric left ventricular hypertrophy. Left ventricular diastolic function could not be evaluated. There is the interventricular septum is flattened in systole and diastole, consistent with right ventricular pressure and volume overload.  2. Right ventricular systolic function is severely reduced. The right ventricular size is severely enlarged. There is severely elevated pulmonary artery systolic pressure. The estimated right ventricular systolic pressure  is 84.6 mmHg.  3. Left atrial size was severely dilated.  4. Right atrial size was severely dilated.  5. A small pericardial effusion is present. The pericardial effusion is posterior to the left ventricle.  6. The mitral valve is degenerative. Mild to moderate mitral valve regurgitation. No evidence of mitral stenosis.  7. Tricuspid valve regurgitation is moderate to severe.  8. Moderate aortic stenosis. Vmax 3.6 m/s, MG 30.2 mmHG, AVA 1.11 cm2, DI 0.32. All consistent with moderate AS. The aortic valve is tricuspid. There is severe calcifcation of the aortic valve. There is severe thickening of the aortic  valve. Aortic valve regurgitation is mild. Moderate aortic valve stenosis.  9. There is mild dilatation of the ascending aorta, measuring 42 mm. 10. The inferior vena cava is dilated in size with <50% respiratory variability, suggesting right atrial pressure of 15 mmHg. Comparison(s): No significant change from prior study. FINDINGS  Left Ventricle: Left ventricular ejection fraction, by estimation, is 60 to 65%. The left ventricle has normal function. The left ventricle has no regional wall motion abnormalities. Definity contrast agent was given IV to delineate the left ventricular  endocardial borders. The left ventricular internal cavity size was normal in size. There is moderate concentric left ventricular hypertrophy. The interventricular septum is flattened in systole and diastole, consistent with right ventricular pressure and volume overload. Left ventricular diastolic function could not be evaluated due to atrial fibrillation. Left ventricular diastolic function could not be evaluated. Right Ventricle: The right ventricular size is severely enlarged. No increase in right ventricular wall thickness. Right ventricular systolic function is severely reduced. There is severely elevated pulmonary artery systolic pressure. The tricuspid regurgitant velocity is 4.17 m/s, and with an assumed right atrial pressure of 15 mmHg, the estimated right ventricular systolic pressure is 24.2 mmHg. Left Atrium: Left atrial size was severely dilated. Right Atrium: Right atrial size was severely dilated. Pericardium: A small pericardial effusion is present. The pericardial effusion is posterior to the left ventricle. Mitral Valve: The mitral valve is degenerative in appearance. Mild to moderate mitral valve regurgitation. No evidence of mitral valve stenosis. Tricuspid Valve: The tricuspid valve is grossly normal. Tricuspid valve regurgitation is moderate to severe. No evidence of tricuspid stenosis. Aortic Valve: Moderate  aortic stenosis. Vmax 3.6 m/s, MG 30.2 mmHG, AVA 1.11 cm2, DI 0.32. All consistent with moderate AS. The aortic valve is tricuspid. There is severe calcifcation of the aortic valve. There is severe thickening of the aortic valve. Aortic valve regurgitation is mild. Moderate aortic stenosis is present. Aortic valve mean gradient measures 30.2 mmHg. Aortic valve peak gradient measures 52.5 mmHg. Aortic valve area, by VTI measures 1.11 cm. Pulmonic Valve: The pulmonic valve was grossly normal. Pulmonic valve regurgitation is not visualized. No evidence of pulmonic stenosis. Aorta: The aortic root is normal in size and structure. There is mild dilatation of the ascending aorta, measuring 42 mm. Venous: The inferior vena cava is dilated in size with less than 50% respiratory variability, suggesting right atrial pressure of 15 mmHg. IAS/Shunts: The atrial septum is grossly normal.  LEFT VENTRICLE PLAX 2D LVIDd:         3.80 cm LVIDs:         2.70 cm LV PW:         1.20 cm LV IVS:        1.40 cm LVOT diam:     2.10 cm LV SV:         73 LV SV Index:  40 LVOT Area:     3.46 cm  IVC IVC diam: 2.30 cm LEFT ATRIUM           Index LA diam:      4.10 cm 2.27 cm/m LA Vol (A4C): 90.4 ml 49.95 ml/m  AORTIC VALVE AV Area (Vmax):    0.95 cm AV Area (Vmean):   0.82 cm AV Area (VTI):     1.11 cm AV Vmax:           362.25 cm/s AV Vmean:          254.500 cm/s AV VTI:            0.657 m AV Peak Grad:      52.5 mmHg AV Mean Grad:      30.2 mmHg LVOT Vmax:         99.80 cm/s LVOT Vmean:        60.400 cm/s LVOT VTI:          0.210 m LVOT/AV VTI ratio: 0.32  AORTA Ao Root diam: 4.00 cm Ao Asc diam:  4.20 cm TRICUSPID VALVE TR Peak grad:   69.6 mmHg TR Vmax:        417.00 cm/s  SHUNTS Systemic VTI:  0.21 m Systemic Diam: 2.10 cm Eleonore Chiquito MD Electronically signed by Eleonore Chiquito MD Signature Date/Time: 07/09/2021/2:43:57 PM    Final      Labs:   Basic Metabolic Panel: Recent Labs  Lab 07/08/21 2100 07/09/21 0252  07/10/21 0403 07/11/21 0456  NA 138 137 140 140  K 3.5 3.5 4.2 3.9  CL 98 97* 102 101  CO2 28 26 28 29   GLUCOSE 95 141* 120* 93  BUN 22 22 31* 33*  CREATININE 1.04 1.02 1.25* 0.99  CALCIUM 8.9 8.6* 8.7* 8.3*  MG  --  2.2  --   --   PHOS  --  4.2  --   --    GFR Estimated Creatinine Clearance: 40.8 mL/min (by C-G formula based on SCr of 0.99 mg/dL). Liver Function Tests: Recent Labs  Lab 07/08/21 2100 07/09/21 0252  AST 28 29  ALT 17 18  ALKPHOS 91 89  BILITOT 1.0 1.2  PROT 8.8* 8.2*  ALBUMIN 4.2 3.9   No results for input(s): LIPASE, AMYLASE in the last 168 hours. No results for input(s): AMMONIA in the last 168 hours. Coagulation profile No results for input(s): INR, PROTIME in the last 168 hours.  CBC: Recent Labs  Lab 07/08/21 2100 07/09/21 0252 07/10/21 0403 07/11/21 0456  WBC 11.0* 12.0* 11.5* 10.2  NEUTROABS 8.2* 10.9*  --  6.3  HGB 11.7* 12.7* 10.1* 10.1*  HCT 38.1* 40.4 32.2* 32.5*  MCV 86.6 84.9 84.7 85.5  PLT 285 294 240 234   Cardiac Enzymes: No results for input(s): CKTOTAL, CKMB, CKMBINDEX, TROPONINI in the last 168 hours. BNP: Invalid input(s): POCBNP CBG: No results for input(s): GLUCAP in the last 168 hours. D-Dimer No results for input(s): DDIMER in the last 72 hours. Hgb A1c No results for input(s): HGBA1C in the last 72 hours. Lipid Profile No results for input(s): CHOL, HDL, LDLCALC, TRIG, CHOLHDL, LDLDIRECT in the last 72 hours. Thyroid function studies Recent Labs    07/09/21 0252  TSH 1.730   Anemia work up No results for input(s): VITAMINB12, FOLATE, FERRITIN, TIBC, IRON, RETICCTPCT in the last 72 hours. Microbiology Recent Results (from the past 240 hour(s))  Resp Panel by RT-PCR (Flu A&B, Covid) Nasopharyngeal Swab     Status:  None   Collection Time: 07/08/21  9:21 PM   Specimen: Nasopharyngeal Swab; Nasopharyngeal(NP) swabs in vial transport medium  Result Value Ref Range Status   SARS Coronavirus 2 by RT PCR NEGATIVE  NEGATIVE Final    Comment: (NOTE) SARS-CoV-2 target nucleic acids are NOT DETECTED.  The SARS-CoV-2 RNA is generally detectable in upper respiratory specimens during the acute phase of infection. The lowest concentration of SARS-CoV-2 viral copies this assay can detect is 138 copies/mL. A negative result does not preclude SARS-Cov-2 infection and should not be used as the sole basis for treatment or other patient management decisions. A negative result may occur with  improper specimen collection/handling, submission of specimen other than nasopharyngeal swab, presence of viral mutation(s) within the areas targeted by this assay, and inadequate number of viral copies(<138 copies/mL). A negative result must be combined with clinical observations, patient history, and epidemiological information. The expected result is Negative.  Fact Sheet for Patients:  EntrepreneurPulse.com.au  Fact Sheet for Healthcare Providers:  IncredibleEmployment.be  This test is no t yet approved or cleared by the [place] FDA and  has been authorized for detection and/or diagnosis of SARS-CoV-2 by FDA under an Emergency Use Authorization (EUA). This EUA will remain  in effect (meaning this test can be used) for the duration of the COVID-19 declaration under Section 564(b)(1) of the Act, 21 U.S.C.section 360bbb-3(b)(1), unless the authorization is terminated  or revoked sooner.       Influenza A by PCR NEGATIVE NEGATIVE Final   Influenza B by PCR NEGATIVE NEGATIVE Final    Comment: (NOTE) The Xpert Xpress SARS-CoV-2/FLU/RSV plus assay is intended as an aid in the diagnosis of influenza from Nasopharyngeal swab specimens and should not be used as a sole basis for treatment. Nasal washings and aspirates are unacceptable for Xpert Xpress SARS-CoV-2/FLU/RSV testing.  Fact Sheet for Patients: EntrepreneurPulse.com.au  Fact Sheet for Healthcare  Providers: IncredibleEmployment.be  This test is not yet approved or cleared by the [place] FDA and has been authorized for detection and/or diagnosis of SARS-CoV-2 by FDA under an Emergency Use Authorization (EUA). This EUA will remain in effect (meaning this test can be used) for the duration of the COVID-19 declaration under Section 564(b)(1) of the Act, 21 U.S.C. section 360bbb-3(b)(1), unless the authorization is terminated or revoked.  Performed at [facility], [place] [address] [place], [place] [address]      Signed: Marlowe Aschoff Lorieann Argueta  [facility] 07/11/2021, 11:04 AM

## 2021-07-18 ENCOUNTER — Other Ambulatory Visit: Payer: Self-pay

## 2021-07-18 ENCOUNTER — Encounter (HOSPITAL_COMMUNITY): Payer: Self-pay | Admitting: Internal Medicine

## 2021-07-18 ENCOUNTER — Ambulatory Visit (HOSPITAL_COMMUNITY)
Admission: RE | Admit: 2021-07-18 | Discharge: 2021-07-18 | Disposition: A | Discharge status: Home / Self Care | Payer: Medicare Other | Source: Ambulatory Visit | Attending: Internal Medicine | Admitting: Internal Medicine

## 2021-07-18 VITALS — BP 118/68 | HR 75 | Wt 153.0 lb

## 2021-07-18 DIAGNOSIS — Z8616 Personal history of COVID-19: Secondary | ICD-10-CM | POA: Diagnosis not present

## 2021-07-18 DIAGNOSIS — I482 Chronic atrial fibrillation, unspecified: Secondary | ICD-10-CM | POA: Diagnosis not present

## 2021-07-18 DIAGNOSIS — I350 Nonrheumatic aortic (valve) stenosis: Secondary | ICD-10-CM | POA: Insufficient documentation

## 2021-07-18 DIAGNOSIS — I4821 Permanent atrial fibrillation: Secondary | ICD-10-CM | POA: Diagnosis not present

## 2021-07-18 DIAGNOSIS — Z87891 Personal history of nicotine dependence: Secondary | ICD-10-CM | POA: Diagnosis not present

## 2021-07-18 DIAGNOSIS — Z09 Encounter for follow-up examination after completed treatment for conditions other than malignant neoplasm: Secondary | ICD-10-CM | POA: Diagnosis not present

## 2021-07-18 DIAGNOSIS — Z951 Presence of aortocoronary bypass graft: Secondary | ICD-10-CM | POA: Insufficient documentation

## 2021-07-18 DIAGNOSIS — Z9981 Dependence on supplemental oxygen: Secondary | ICD-10-CM | POA: Diagnosis not present

## 2021-07-18 DIAGNOSIS — I2510 Atherosclerotic heart disease of native coronary artery without angina pectoris: Secondary | ICD-10-CM | POA: Insufficient documentation

## 2021-07-18 DIAGNOSIS — I50812 Chronic right heart failure: Secondary | ICD-10-CM | POA: Insufficient documentation

## 2021-07-18 DIAGNOSIS — G4733 Obstructive sleep apnea (adult) (pediatric): Secondary | ICD-10-CM | POA: Diagnosis not present

## 2021-07-18 DIAGNOSIS — I2729 Other secondary pulmonary hypertension: Secondary | ICD-10-CM | POA: Insufficient documentation

## 2021-07-18 DIAGNOSIS — J449 Chronic obstructive pulmonary disease, unspecified: Secondary | ICD-10-CM | POA: Diagnosis not present

## 2021-07-18 DIAGNOSIS — J849 Interstitial pulmonary disease, unspecified: Secondary | ICD-10-CM | POA: Diagnosis not present

## 2021-07-18 DIAGNOSIS — J9610 Chronic respiratory failure, unspecified whether with hypoxia or hypercapnia: Secondary | ICD-10-CM | POA: Diagnosis not present

## 2021-07-18 DIAGNOSIS — Z79899 Other long term (current) drug therapy: Secondary | ICD-10-CM | POA: Diagnosis not present

## 2021-07-18 LAB — BASIC METABOLIC PANEL
Anion gap: 8 (ref 5–15)
BUN: 17 mg/dL (ref 8–23)
CO2: 27 mmol/L (ref 22–32)
Calcium: 8.3 mg/dL — ABNORMAL LOW (ref 8.9–10.3)
Chloride: 100 mmol/L (ref 98–111)
Creatinine, Ser: 1.11 mg/dL (ref 0.61–1.24)
GFR, Estimated: >60 mL/min (ref >60)
Glucose, Bld: 96 mg/dL (ref 70–99)
Potassium: 3.5 mmol/L (ref 3.5–5.1)
Sodium: 135 mmol/L (ref 135–145)

## 2021-07-18 LAB — BRAIN NATRIURETIC PEPTIDE: B Natriuretic Peptide: 408.2 pg/mL — ABNORMAL HIGH (ref 0.0–100.0)

## 2021-07-18 MED ORDER — METAMUCIL SMOOTH TEXTURE 58.6 % PO POWD: 1.0000 | Freq: Three times a day (TID) | ORAL | 12 refills | Status: DC

## 2021-07-18 MED ORDER — POTASSIUM CHLORIDE CRYS ER 10 MEQ PO TBCR: 20.0000 meq | EXTENDED_RELEASE_TABLET | Freq: Two times a day (BID) | ORAL | 6 refills | Status: DC

## 2021-07-18 NOTE — Progress Notes (Signed)
ADVANCED HF CLINIC NOTE   Primary Cardiologist: Dr. Gwenlyn Found HF Cardiologist: Dr. Haroldine Laws   HPI:  Vincent Robinson is 87 y.o. 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   He had coronary artery bypass grafting in 1998. He had an endoluminal stent graft for abdominal aortic aneurysm in [place] 10/09 . Pre-op cath showed patent grafts.  Doppler performed 2/18 revealed aortic dimensions of approximately 4.4 x 4.6 cm. Has been followed by Dr. Vaughan Browner.  Wears BIPAP for severe OSA. Has been on home O2 since 2017.   He was referred to me in 2/19 by Dr. Gwenlyn Found for further evaluation of moderate to severe PAH. He has tolerated combination therapy well with improvement of his symptoms.  Echo 11/19: EF 60-65% RV mild to moderately reduced. RVSP 20mmHG Mild AS.  Hospitalized in 2/21 for volume overload and hypoxia. Diuresed well. Switched to torsemide. Started on midodrine for low BP.   Admitted in 11/21 Sepsis due to acute cholecystitis with enterococcus and enterobacter. C- tube placed.  Echo 11/21 EF 60-65% RV moderately HK RVSP 85 AoV severely calcified. Moderate AS Personally reviewed  Hospitalized in 1/22 due COVID and aspiration PNA.   Admitted 9/17-9/20/22 with A/CHF and A/C hypoxemic respiratory failure. Echo showed EF 60-65%, breathing improved after several doses of IV lasix. Hospitalization c/b dysphagia, GI consulted but deferred endoscopic eval due to age and comorbidities. Swallowing difficulties resolved by discharge, d/c weight 148 lbs.  Today he returns for post hospitalization HF follow up with his daughter. Gets around the house OK without significant SOB, but feels breathing is getting worse with less activity.Denies  CP, dizziness, edema, or PND/Orthopnea. Appetite ok. No fever or chills. Taking all medications. Ongoing mild drainage in C-tube, Queen died on his birthday. Living at [facility]. Daughter says breathing has deteriorated some since TURBT this summer. Has HH RN come out twice a week.   Studies:  RHC 12/11/17 RA = 6 RV = 72/7 PA = 73/27 (44) PCW = 10 Fick cardiac output/index = 4.1/2.1 PVR = 8.4 WU Ao sat = 91% PA sat = 58%, 58%   Echo 11/18 EF 60-65% grade I DD  RV severely dilated moderate TR RVSP 40mmHG   VQ 12/18 Normal perfusion lung scan.   Irregular peripheral ventilation in both lungs consistent with chronic parenchymal lung disease changes.  PFTs 02/28/16 FVC 3.37 (116%), FEV1 2.08 (108%), F/F 62 Mild obstructive airway disease, no broncho-dilator response  CT scan 07/15/15- 2 .3 x 1.7 cm right upper lobe calcified opacity likely to represent pleural , parenchymal scarring. Mild centrilobular, paraseptal emphysema. Subpleural reticulation, fibrosis representing mild chronic ILD High-resolution CT 09/04/16 - stable right upper lobe scarring, hyper aerated lungs. Minimal interstitial lung disease and peripheral unchanged compared to 2016.  Hi-res CT: 01/06/18: Stable ILD   Review of systems complete and found to be negative unless listed in HPI.   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     ferrous sulfate 325 (65 FE) MG tablet Take 325 mg by mouth daily with breakfast.     levothyroxine (SYNTHROID) 88 MCG tablet Take 88 mcg by mouth daily before breakfast.     macitentan (OPSUMIT) 10 MG tablet Take 1 tablet (10 mg total) by mouth daily. Needs appt for future refills 30 tablet 3   midodrine (PROAMATINE) 10 MG tablet Take 1 tablet (10 mg total) by mouth 3 (three) times daily after meals. In addition with the 5mg  tablets (Patient taking differently: Take 10 mg by mouth 3 (three) times daily. Take with a 5 mg tablet for a total dose of 15 mg) 270 tablet 3   midodrine (PROAMATINE) 5 MG tablet Take 1 tablet (5 mg total) by mouth 3 (three) times daily with meals. In addition with the 10mg  tablets (Patient taking differently: Take 5 mg by mouth 3 (three) times daily. Take with a 10 mg tablet for a total dose of 15 mg) 270 tablet 3   Mometasone Furo-Formoterol Fum (DULERA IN) Inhale 1 puff into the lungs 2 (two) times daily as needed (shortness of breath).     montelukast (SINGULAIR) 10 MG tablet Take 10 mg by mouth at bedtime.     pantoprazole (PROTONIX) 20 MG tablet Take 20 mg by mouth every morning.     sildenafil (REVATIO) 20 MG tablet TAKE TWO TABLETS BY MOUTH THREE TIMES A DAY (Patient taking differently: Take 40 mg by mouth 3 (three) times daily. For pulmonary hypertension) 540 tablet 3   simvastatin (ZOCOR) 10 MG tablet Take 10 mg by mouth at bedtime.     Sodium Chloride Flush (NORMAL SALINE FLUSH) 0.9 % SOLN USE 5 MLS 2 TIMES DAILY AS DIRECTED (Patient taking differently: 5 mLs See admin instructions. Flush gall bladder drain with 5 mls twice weekly) 600 mL 1   torsemide (DEMADEX) 20 MG tablet TAKE TWO TABLETS BY MOUTH DAILY (Patient taking differently: No sig reported) 60 tablet 11   Vitamin D, Ergocalciferol, (DRISDOL) 1.25 MG (50000 UNIT) CAPS capsule Take 50,000 Units by mouth every Sunday.     potassium chloride SA (KLOR-CON) 20 MEQ tablet  Take 20 mEq by mouth in the morning and at bedtime. (Patient not taking: Reported on 07/18/2021)     No current facility-administered medications for this encounter.   Facility-Administered Medications Ordered in Other Encounters  Medication Dose Route Frequency Provider Last Rate Last Admin   gemcitabine (GEMZAR) chemo syringe for bladder instillation 2,000 mg  2,000 mg Bladder Instillation Once Franchot Gallo, MD       Allergies  Allergen Reactions   Hydrocodone Other (See Comments)   Social History   Socioeconomic History   Marital status: Widowed    Spouse name: Not on file   Number of children: Not on file   Years of education: Not on file   Highest education level: Not on file  Occupational History   Not on file  Tobacco Use  Smoking status: Former    Packs/day: 0.50    Types: Cigarettes    Quit date: 08/06/1961    Years since quitting: 59.9   Smokeless tobacco: Never  Vaping Use   Vaping Use: Never used  Substance and Sexual Activity   Alcohol use: Not Currently   Drug use: Never   Sexual activity: Not Currently  Other Topics Concern   Not on file  Social History Narrative   Not on file   Social Determinants of Health   Financial Resource Strain: Not on file  Food Insecurity: Not on file  Transportation Needs: Not on file  Physical Activity: Not on file  Stress: Not on file  Social Connections: Not on file  Intimate Partner Violence: Not on file   Family History  Problem Relation Age of Onset   Emphysema Mother    Stroke Father    Movement disorder Sister    Aneurysm Brother        Thoracic aorta   Macular degeneration Brother    BP 118/68   Pulse 75   Wt 69.4 kg   SpO2 98%   BMI 23.96 kg/m   Wt Readings from Last 3 Encounters:  07/18/21 69.4 kg  07/11/21 67.2 kg  04/26/21 67.1 kg   PHYSICAL EXAM: General:  NAD. No resp difficulty, elderly on oxygen HEENT: Normal Neck: Supple. JVP ~8-9. Carotids 2+ bilat; no bruits. No  lymphadenopathy or thryomegaly appreciated. Cor: PMI nondisplaced. Irregular rate & rhythm. No rubs, gallops, 2/6 AS Lungs: LLL fine crackle Abdomen: Soft, nontender, nondistended. No hepatosplenomegaly. No bruits or masses. Good bowel sounds. C-tube looks ok Extremities: No cyanosis, clubbing, rash, trace pedal edema L>R Neuro: Alert & oriented x 3, cranial nerves grossly intact. Moves all 4 extremities w/o difficulty. Affect pleasant.  ASSESSMENT & PLAN:  1. Pulmonary hypertension with cor pulmonale - Echo, CT, PFTs and VQ studies reviewed personally - He has evidence of significant pulmonary HTN with severe RV failure. Etiology remains unclear.  - CT with evidence of ILD but PFTs not that bad. VQ negative.  - PH multifactorial but out of proportion to HF & ILD so felt to have component of Group I disease. - RHC 2/19 c/w moderate PAH - NYHA III Continue sildenafil 40 mg tid  And macitentan 10 daily. No plans to add selexipag at this point - Volume status stable on torsemide 40 daily - Continue midodrine 5 mg tid. - Continue compression stockings. - BMET and BNP today.  2. Chronic respiratory failure - Evidence of mild ILD on CT. PFTs not too bad - Has severe OSA on Bipap. Continue BiPAP qHS - he uses it religiously - Continue supplemental O2. Sats look good today - Follows with Pulmonary.  3. CAD s/p CABG - No s/s angina Myoview 1/19 normal.  - Managed by Dr. Gwenlyn Found.  - On ASA and statin.  4. Aortic stenosis - Moderate by echo 2/21 mean gradient 22.  - Will follow.   5. Atrial fibrillation, chronic - Rate controlled.  - Not candidate for [facility] due to recurrent LGIB  6. Cholecystitis - Continue C-tube with regular tube changes - He is not candidate for cholecystectomy due to very high surgical risk.  Follow up in 3-4 months with Dr. Haroldine Laws.  Vincent Bihari, FNP  4:07 PM  Patient seen and examined with the above-signed Advanced Practice Provider and/or Housestaff. I  personally reviewed laboratory data, imaging studies and relevant notes. I independently examined the patient and formulated the important  aspects of the plan. I have edited the note to reflect any of my changes or salient points. I have personally discussed the plan with the patient and/or family.  Vincent Robinson is a delightful 87 y/o male with PAH, moderate AS, chronic AF, CAD. Recently admitted for food impaction and HF. Here with his daughter for post hospital f/u. Feels well. Has mild ankle edema at night that resolves every morning. Wears compression socks. Denies CP. Says breathing is back to normal.   General:  Elderly male in [facility]. On O2 by [place].  No resp difficulty HEENT: normal Neck: supple. no JVD. Carotids 2+ bilat; + bruits. No lymphadenopathy or thryomegaly appreciated. Cor: PMI nondisplaced. Irregular rate & rhythm. 2/6 AS  s2 ok Lungs: decreased throughout Abdomen: soft, nontender, nondistended. No hepatosplenomegaly. No bruits or masses. Good bowel sounds. + c-tube Extremities: no cyanosis, clubbing, rash, 2+ ankle edema. No pretibial edema Neuro: alert & orientedx3, cranial nerves grossly intact. moves all 4 extremities w/o difficulty. Affect pleasant  Overall stable. Has some mild dependent ankle edema but resolves overnight. Tolerating PAH meds well. AF is rate controlled,. Not on [facility] due to h/o GIB. AS moderate on echo and exam. No change today.   Glori Bickers, MD  10:57 PM

## 2021-07-18 NOTE — Patient Instructions (Signed)
Potassium changed to 10 meq, take 2 tabs Twice daily   Take Metamucil once a day  Your physician recommends that you schedule a follow-up appointment in: 3-4 months  If you have any questions or concerns before your next appointment please send [place] a message through [place] or call our office at [phone].    TO LEAVE A MESSAGE FOR THE NURSE SELECT OPTION 2, PLEASE LEAVE A MESSAGE INCLUDING: YOUR NAME DATE OF BIRTH CALL BACK NUMBER REASON FOR CALL**this is important as we prioritize the call backs  YOU WILL RECEIVE A CALL BACK THE SAME DAY AS LONG AS YOU CALL BEFORE 4:00 PM  At the [facility], you and your health needs are our priority. As part of our continuing mission to provide you with exceptional heart care, we have created designated Provider Care Teams. These Care Teams include your primary Cardiologist (physician) and Advanced Practice Providers (APPs- Physician Assistants and Nurse Practitioners) who all work together to provide you with the care you need, when you need it.   You may see any of the following providers on your designated Care Team at your next follow up: Dr Glori Bickers Dr Loralie Champagne Dr Patrice Paradise, NP Lyda Jester, [place] Ginnie Smart Audry Riles, PharmD   Please be sure to bring in all your medications bottles to every appointment.

## 2021-07-31 ENCOUNTER — Other Ambulatory Visit: Payer: Self-pay

## 2021-07-31 ENCOUNTER — Ambulatory Visit (HOSPITAL_COMMUNITY)
Admission: RE | Admit: 2021-07-31 | Discharge: 2021-07-31 | Disposition: A | Discharge status: Home / Self Care | Payer: Medicare Other | Source: Ambulatory Visit | Attending: Interventional Radiology | Admitting: Interventional Radiology

## 2021-07-31 DIAGNOSIS — Z434 Encounter for attention to other artificial openings of digestive tract: Secondary | ICD-10-CM | POA: Diagnosis not present

## 2021-07-31 DIAGNOSIS — K819 Cholecystitis, unspecified: Secondary | ICD-10-CM | POA: Diagnosis not present

## 2021-07-31 HISTORY — PX: IR EXCHANGE BILIARY DRAIN: IMG6046

## 2021-07-31 MED ORDER — LIDOCAINE HCL 1 % IJ SOLN
INTRAMUSCULAR | Status: AC
  Filled 2021-07-31: qty 20

## 2021-07-31 NOTE — Procedures (Signed)
Pre procedural Dx: Chronic chole tube. Post procedural Dx: Same  Technically successful [place] and Fluoro guided exchange and upsizing of now 12 Fr chole tube. Chole tube connected to gravity bag.  EBL: None  Complications: None immediate  Ronny Bacon, MD Pager #: [phone]

## 2021-08-21 ENCOUNTER — Other Ambulatory Visit (HOSPITAL_COMMUNITY): Payer: Self-pay | Admitting: Interventional Radiology

## 2021-08-21 DIAGNOSIS — K819 Cholecystitis, unspecified: Secondary | ICD-10-CM

## 2021-09-04 ENCOUNTER — Observation Stay (HOSPITAL_COMMUNITY)
Admission: EM | Admit: 2021-09-04 | Discharge: 2021-09-06 | Disposition: A | Discharge status: Home Health | Payer: Medicare Other | Attending: Family Medicine | Admitting: Family Medicine

## 2021-09-04 ENCOUNTER — Emergency Department (HOSPITAL_COMMUNITY): Payer: Medicare Other

## 2021-09-04 DIAGNOSIS — E039 Hypothyroidism, unspecified: Secondary | ICD-10-CM | POA: Diagnosis not present

## 2021-09-04 DIAGNOSIS — Z20822 Contact with and (suspected) exposure to covid-19: Secondary | ICD-10-CM | POA: Diagnosis not present

## 2021-09-04 DIAGNOSIS — J849 Interstitial pulmonary disease, unspecified: Secondary | ICD-10-CM | POA: Diagnosis present

## 2021-09-04 DIAGNOSIS — J449 Chronic obstructive pulmonary disease, unspecified: Secondary | ICD-10-CM | POA: Insufficient documentation

## 2021-09-04 DIAGNOSIS — Z87891 Personal history of nicotine dependence: Secondary | ICD-10-CM | POA: Insufficient documentation

## 2021-09-04 DIAGNOSIS — R55 Syncope and collapse: Principal | ICD-10-CM | POA: Insufficient documentation

## 2021-09-04 DIAGNOSIS — I4891 Unspecified atrial fibrillation: Secondary | ICD-10-CM | POA: Insufficient documentation

## 2021-09-04 DIAGNOSIS — R001 Bradycardia, unspecified: Secondary | ICD-10-CM

## 2021-09-04 DIAGNOSIS — I714 Abdominal aortic aneurysm, without rupture, unspecified: Secondary | ICD-10-CM | POA: Diagnosis present

## 2021-09-04 DIAGNOSIS — Z951 Presence of aortocoronary bypass graft: Secondary | ICD-10-CM | POA: Diagnosis not present

## 2021-09-04 DIAGNOSIS — I2510 Atherosclerotic heart disease of native coronary artery without angina pectoris: Secondary | ICD-10-CM | POA: Diagnosis not present

## 2021-09-04 DIAGNOSIS — I5033 Acute on chronic diastolic (congestive) heart failure: Secondary | ICD-10-CM | POA: Insufficient documentation

## 2021-09-04 DIAGNOSIS — J45909 Unspecified asthma, uncomplicated: Secondary | ICD-10-CM | POA: Insufficient documentation

## 2021-09-04 DIAGNOSIS — Z85828 Personal history of other malignant neoplasm of skin: Secondary | ICD-10-CM | POA: Diagnosis not present

## 2021-09-04 DIAGNOSIS — Z8616 Personal history of COVID-19: Secondary | ICD-10-CM | POA: Diagnosis not present

## 2021-09-04 DIAGNOSIS — J9621 Acute and chronic respiratory failure with hypoxia: Secondary | ICD-10-CM | POA: Insufficient documentation

## 2021-09-04 LAB — BASIC METABOLIC PANEL
Anion gap: 10 (ref 5–15)
BUN: 18 mg/dL (ref 8–23)
CO2: 26 mmol/L (ref 22–32)
Calcium: 8.2 mg/dL — ABNORMAL LOW (ref 8.9–10.3)
Chloride: 101 mmol/L (ref 98–111)
Creatinine, Ser: 1.18 mg/dL (ref 0.61–1.24)
GFR, Estimated: 56 mL/min — ABNORMAL LOW (ref >60)
Glucose, Bld: 131 mg/dL — ABNORMAL HIGH (ref 70–99)
Potassium: 3.5 mmol/L (ref 3.5–5.1)
Sodium: 137 mmol/L (ref 135–145)

## 2021-09-04 LAB — CBC WITH DIFFERENTIAL/PLATELET
Abs Immature Granulocytes: 0.01 10*3/uL (ref 0.00–0.07)
Basophils Absolute: 0.1 10*3/uL (ref 0.0–0.1)
Basophils Relative: 1 %
Eosinophils Absolute: 0.2 10*3/uL (ref 0.0–0.5)
Eosinophils Relative: 2 %
HCT: 38.6 % — ABNORMAL LOW (ref 39.0–52.0)
Hemoglobin: 12.7 g/dL — ABNORMAL LOW (ref 13.0–17.0)
Immature Granulocytes: 0 %
Lymphocytes Relative: 32 %
Lymphs Abs: 2.8 10*3/uL (ref 0.7–4.0)
MCH: 30.2 pg (ref 26.0–34.0)
MCHC: 32.9 g/dL (ref 30.0–36.0)
MCV: 91.9 fL (ref 80.0–100.0)
Monocytes Absolute: 0.7 10*3/uL (ref 0.1–1.0)
Monocytes Relative: 8 %
Neutro Abs: 4.9 10*3/uL (ref 1.7–7.7)
Neutrophils Relative %: 57 %
Platelets: 263 10*3/uL (ref 150–400)
RBC: 4.2 MIL/uL — ABNORMAL LOW (ref 4.22–5.81)
RDW: 19.4 % — ABNORMAL HIGH (ref 11.5–15.5)
WBC: 8.7 10*3/uL (ref 4.0–10.5)
nRBC: 0 % (ref 0.0–0.2)

## 2021-09-04 LAB — RESP PANEL BY RT-PCR (FLU A&B, COVID) ARPGX2
Influenza A by PCR: NEGATIVE
Influenza B by PCR: NEGATIVE
SARS Coronavirus 2 by RT PCR: NEGATIVE

## 2021-09-04 LAB — BRAIN NATRIURETIC PEPTIDE: B Natriuretic Peptide: 410 pg/mL — ABNORMAL HIGH (ref 0.0–100.0)

## 2021-09-04 LAB — TROPONIN I (HIGH SENSITIVITY): Troponin I (High Sensitivity): 15 ng/L (ref <18)

## 2021-09-04 NOTE — ED Triage Notes (Signed)
Pt bib [facility] for near syncopal episode in the bathroom from independent living. Pt was dizzy, SOB, and nauseous. Pt has hx of COPD and CHF, is on 4L [place] at all times. PCP says O2 should remain above 88%. Upon EMS arrival Pt O2 tubing was kinked. EMS gave 4mg  zofran. 20g Lt FA.   EMS vitals 112/56 BP 60 HR 93% 4L [place] 120 CBG

## 2021-09-04 NOTE — ED Provider Notes (Signed)
  Provider Note MRN:  [id number]  Arrival date & time: 09/05/21    ED Course and Medical Decision Making  Assumed care from Dr. Langston Masker at shift change.  Near syncopal episode with significant nausea shortly prior to arrival.  Arriving with new oxygen requirement.  Uses 3 L at home, required up to 7 L here to maintain saturations in the low 90s.  Patient feels a lot better on my evaluation, asymptomatic, saturations 99%.  I have titrated down his oxygen to 5 L, we are awaiting labs, monitoring closely.  Initial suspicions are an acute pulmonary edema versus NSTEMI, may need admission.  Patient is without neurological deficits at this time on my exam, normal and symmetric strength and sensation, normal coordination, normal speech.  Some lower extremity edema.  Labs reveal a normal troponin, mildly elevated BNP.  Patient's oxygen saturation 91% on my repeat evaluation, still on 5 L nasal cannula.  Given this and increased oxygen demand, will admit to medicine.  Providing dose of Lasix.  .Critical Care Performed by: Maudie Flakes, MD Authorized by: Maudie Flakes, MD   Critical care provider statement:    Critical care time (minutes):  32   Critical care time was exclusive of:  Separately billable procedures and treating other patients   Critical care was necessary to treat or prevent imminent or life-threatening deterioration of the following conditions:  Respiratory failure   Critical care was time spent personally by me on the following activities:  Ordering and performing treatments and interventions, ordering and review of laboratory studies, ordering and review of radiographic studies, re-evaluation of patient's condition, discussions with consultants, evaluation of patient's response to treatment and examination of patient   I assumed direction of critical care for this patient from another provider in my specialty: yes     Care discussed with: admitting provider    Final Clinical  Impressions(s) / ED Diagnoses     ICD-10-CM   1. Near syncope  R55     2. Acute on chronic respiratory failure with hypoxia [facility])  J96.21       ED Discharge Orders     None       Discharge Instructions   None     Barth Kirks. [place] Small, [place] [email or web address]    Maudie Flakes, MD 09/05/21 [phone]

## 2021-09-04 NOTE — ED Notes (Signed)
Pt O2 78%, placed on 6L[place].

## 2021-09-05 ENCOUNTER — Other Ambulatory Visit: Payer: Self-pay

## 2021-09-05 ENCOUNTER — Encounter (HOSPITAL_COMMUNITY): Payer: Self-pay | Admitting: Internal Medicine

## 2021-09-05 DIAGNOSIS — R55 Syncope and collapse: Secondary | ICD-10-CM | POA: Diagnosis not present

## 2021-09-05 LAB — BASIC METABOLIC PANEL
Anion gap: 8 (ref 5–15)
BUN: 15 mg/dL (ref 8–23)
CO2: 30 mmol/L (ref 22–32)
Calcium: 8.2 mg/dL — ABNORMAL LOW (ref 8.9–10.3)
Chloride: 100 mmol/L (ref 98–111)
Creatinine, Ser: 1.1 mg/dL (ref 0.61–1.24)
GFR, Estimated: >60 mL/min (ref >60)
Glucose, Bld: 89 mg/dL (ref 70–99)
Potassium: 3.7 mmol/L (ref 3.5–5.1)
Sodium: 138 mmol/L (ref 135–145)

## 2021-09-05 LAB — LACTIC ACID, PLASMA
Lactic Acid, Venous: 1.1 mmol/L (ref 0.5–1.9)
Lactic Acid, Venous: 1 mmol/L (ref 0.5–1.9)

## 2021-09-05 LAB — CBC WITH DIFFERENTIAL/PLATELET
Abs Immature Granulocytes: 0.02 10*3/uL (ref 0.00–0.07)
Basophils Absolute: 0.1 10*3/uL (ref 0.0–0.1)
Basophils Relative: 1 %
Eosinophils Absolute: 0.1 10*3/uL (ref 0.0–0.5)
Eosinophils Relative: 1 %
HCT: 37.2 % — ABNORMAL LOW (ref 39.0–52.0)
Hemoglobin: 12 g/dL — ABNORMAL LOW (ref 13.0–17.0)
Immature Granulocytes: 0 %
Lymphocytes Relative: 32 %
Lymphs Abs: 2.1 10*3/uL (ref 0.7–4.0)
MCH: 29.9 pg (ref 26.0–34.0)
MCHC: 32.3 g/dL (ref 30.0–36.0)
MCV: 92.5 fL (ref 80.0–100.0)
Monocytes Absolute: 0.7 10*3/uL (ref 0.1–1.0)
Monocytes Relative: 10 %
Neutro Abs: 3.7 10*3/uL (ref 1.7–7.7)
Neutrophils Relative %: 56 %
Platelets: 230 10*3/uL (ref 150–400)
RBC: 4.02 MIL/uL — ABNORMAL LOW (ref 4.22–5.81)
RDW: 19.3 % — ABNORMAL HIGH (ref 11.5–15.5)
WBC: 6.5 10*3/uL (ref 4.0–10.5)
nRBC: 0 % (ref 0.0–0.2)

## 2021-09-05 LAB — HEPATIC FUNCTION PANEL
ALT: 25 U/L (ref 0–44)
AST: 54 U/L — ABNORMAL HIGH (ref 15–41)
Albumin: 3.1 g/dL — ABNORMAL LOW (ref 3.5–5.0)
Alkaline Phosphatase: 102 U/L (ref 38–126)
Bilirubin, Direct: 0.5 mg/dL — ABNORMAL HIGH (ref 0.0–0.2)
Indirect Bilirubin: 0.8 mg/dL (ref 0.3–0.9)
Total Bilirubin: 1.3 mg/dL — ABNORMAL HIGH (ref 0.3–1.2)
Total Protein: 6.9 g/dL (ref 6.5–8.1)

## 2021-09-05 LAB — URINALYSIS, ROUTINE W REFLEX MICROSCOPIC
Bilirubin Urine: NEGATIVE
Glucose, UA: NEGATIVE mg/dL
Hgb urine dipstick: NEGATIVE
Ketones, ur: NEGATIVE mg/dL
Nitrite: NEGATIVE
Protein, ur: NEGATIVE mg/dL
Specific Gravity, Urine: 1.01 (ref 1.005–1.030)
WBC, UA: >50 WBC/hpf — ABNORMAL HIGH (ref 0–5)
pH: 6 (ref 5.0–8.0)

## 2021-09-05 LAB — MAGNESIUM: Magnesium: 2.4 mg/dL (ref 1.7–2.4)

## 2021-09-05 LAB — TSH: TSH: 1.857 u[IU]/mL (ref 0.350–4.500)

## 2021-09-05 LAB — TROPONIN I (HIGH SENSITIVITY): Troponin I (High Sensitivity): 16 ng/L (ref <18)

## 2021-09-05 MED ORDER — MIDODRINE HCL 5 MG PO TABS
15.0000 mg | ORAL_TABLET | Freq: Three times a day (TID) | ORAL | Status: DC
  Administered 2021-09-05 – 2021-09-06 (×4): 15 mg via ORAL
  Filled 2021-09-05 (×4): qty 3

## 2021-09-05 MED ORDER — ALBUTEROL SULFATE (2.5 MG/3ML) 0.083% IN NEBU: 2.5000 mg | INHALATION_SOLUTION | Freq: Four times a day (QID) | RESPIRATORY_TRACT | Status: DC | PRN

## 2021-09-05 MED ORDER — TORSEMIDE 20 MG PO TABS
40.0000 mg | ORAL_TABLET | Freq: Every morning | ORAL | Status: DC
  Administered 2021-09-05 – 2021-09-06 (×2): 40 mg via ORAL
  Filled 2021-09-05: qty 2

## 2021-09-05 MED ORDER — FUROSEMIDE 10 MG/ML IJ SOLN
20.0000 mg | Freq: Once | INTRAMUSCULAR | Status: AC
  Administered 2021-09-05: 20 mg via INTRAVENOUS
  Filled 2021-09-05: qty 2

## 2021-09-05 MED ORDER — FUROSEMIDE 10 MG/ML IJ SOLN: 40.0000 mg | Freq: Once | INTRAMUSCULAR | Status: DC

## 2021-09-05 MED ORDER — ENOXAPARIN SODIUM 40 MG/0.4ML IJ SOSY
40.0000 mg | PREFILLED_SYRINGE | INTRAMUSCULAR | Status: DC
  Administered 2021-09-05 – 2021-09-06 (×2): 40 mg via SUBCUTANEOUS
  Filled 2021-09-05 (×2): qty 0.4

## 2021-09-05 MED ORDER — SIMVASTATIN 20 MG PO TABS
10.0000 mg | ORAL_TABLET | Freq: Every day | ORAL | Status: DC
  Administered 2021-09-05: 10 mg via ORAL
  Filled 2021-09-05: qty 1

## 2021-09-05 MED ORDER — MIDODRINE HCL 5 MG PO TABS: 5.0000 mg | ORAL_TABLET | Freq: Three times a day (TID) | ORAL | Status: DC

## 2021-09-05 MED ORDER — SILDENAFIL CITRATE 20 MG PO TABS
40.0000 mg | ORAL_TABLET | Freq: Three times a day (TID) | ORAL | Status: DC
  Administered 2021-09-05 – 2021-09-06 (×4): 40 mg via ORAL
  Filled 2021-09-05 (×5): qty 2

## 2021-09-05 MED ORDER — MACITENTAN 10 MG PO TABS: 10.0000 mg | ORAL_TABLET | Freq: Every day | ORAL | Status: DC

## 2021-09-05 MED ORDER — POTASSIUM CHLORIDE CRYS ER 20 MEQ PO TBCR
20.0000 meq | EXTENDED_RELEASE_TABLET | Freq: Two times a day (BID) | ORAL | Status: DC
  Administered 2021-09-05 – 2021-09-06 (×3): 20 meq via ORAL
  Filled 2021-09-05 (×3): qty 1

## 2021-09-05 MED ORDER — FERROUS SULFATE 325 (65 FE) MG PO TABS
325.0000 mg | ORAL_TABLET | Freq: Every day | ORAL | Status: DC
  Administered 2021-09-05 – 2021-09-06 (×2): 325 mg via ORAL
  Filled 2021-09-05 (×2): qty 1

## 2021-09-05 MED ORDER — MOMETASONE FURO-FORMOTEROL FUM 100-5 MCG/ACT IN AERO: 2.0000 | INHALATION_SPRAY | Freq: Two times a day (BID) | RESPIRATORY_TRACT | Status: DC | PRN

## 2021-09-05 MED ORDER — PSYLLIUM 95 % PO PACK
1.0000 | PACK | Freq: Three times a day (TID) | ORAL | Status: DC
  Administered 2021-09-05 – 2021-09-06 (×3): 1 via ORAL
  Filled 2021-09-05 (×5): qty 1

## 2021-09-05 MED ORDER — PANTOPRAZOLE SODIUM 20 MG PO TBEC
20.0000 mg | DELAYED_RELEASE_TABLET | Freq: Every morning | ORAL | Status: DC
  Administered 2021-09-05 – 2021-09-06 (×2): 20 mg via ORAL
  Filled 2021-09-05: qty 1

## 2021-09-05 MED ORDER — LEVOTHYROXINE SODIUM 88 MCG PO TABS
88.0000 ug | ORAL_TABLET | Freq: Every day | ORAL | Status: DC
  Administered 2021-09-06: 88 ug via ORAL
  Filled 2021-09-05 (×3): qty 1

## 2021-09-05 MED ORDER — MACITENTAN 10 MG PO TABS
10.0000 mg | ORAL_TABLET | Freq: Every day | ORAL | Status: DC
  Administered 2021-09-05 – 2021-09-06 (×2): 10 mg via ORAL
  Filled 2021-09-05 (×2): qty 1

## 2021-09-05 NOTE — Evaluation (Signed)
Physical Therapy Evaluation Patient Details Name: Vincent Robinson MRN: [id number] DOB: Oct 30, 1922 Today's Date: 09/05/2021  History of Present Illness  87 year old male who presented to the hosptial on 11/14 with dizziness, light headed feeling.  Pt was brought to  hosp with low BP, hypoxia on monitor and increased his O2 sats.  Pt is not SOB but cannot tolerate much standing without desaturating.  PMH: asthma, COPD, chronic respiratory failure on home O2 2-3L, CAD, Hyperlipidemia, chronic diastolic CHF, [id number], cor pulmonale, diverticulitis, basal cell CA, CHF  Clinical Impression  Pt was seen for initial evaluation and noted the following values for mobility: Supine BP 93/50 pulse 75, sat 89%;  sit 99/60 pulse 69, sat 90%;  standing BP 88/54, pulse 69 and sat 82%.  Pt was feeling mildly light headed and sat down due to values and unsteadiness.  Given the competing issues, would need to do repeated testing to see which things were most symptomatic.  However, per pt he is often hypoxic and the BP may have been more the issue.   Follow along with pt to improve his mobility as his condition permits, which currently does not allow for gait. Focus on OOB to chair and gradually increasing his strength, with SNF requested due to his lack of help for home and fragile values of vitals which will require oversight medically to strengthen him safely.      Recommendations for follow up therapy are one component of a multi-disciplinary discharge planning process, led by the attending physician.  Recommendations may be updated based on patient status, additional functional criteria and insurance authorization.  Follow Up Recommendations Skilled nursing-short term rehab (<3 hours/day)    Assistance Recommended at Discharge Frequent or constant Supervision/Assistance  Functional Status Assessment Patient has had a recent decline in their functional status and demonstrates the ability to make significant  improvements in function in a reasonable and predictable amount of time.  Equipment Recommendations  None recommended by PT    Recommendations for Other Services       Precautions / Restrictions Precautions Precautions: Fall;Other (comment) Precaution Comments: close monitoring of BP and sats Restrictions Weight Bearing Restrictions: No      Mobility  Bed Mobility Overal bed mobility: Needs Assistance Bed Mobility: Supine to Sit;Sit to Supine     Supine to sit: Mod assist Sit to supine: Mod assist   General bed mobility comments: R leg bag for gallbladder    Transfers Overall transfer level: Needs assistance Equipment used: 1 person hand held assist;Rolling walker (2 wheels) Transfers: Sit to/from Stand Sit to Stand: Mod assist                Ambulation/Gait               General Gait Details: deferred over BP and sats  Stairs            Wheelchair Mobility    Modified Rankin (Stroke Patients Only)       Balance Overall balance assessment: Needs assistance;History of Falls Sitting-balance support: Feet supported Sitting balance-Leahy Scale: Fair     Standing balance support: Bilateral upper extremity supported;During functional activity Standing balance-Leahy Scale: Poor                               Pertinent Vitals/Pain Pain Assessment: No/denies pain    Home Living Family/patient expects to be discharged to:: Private residence Living Arrangements: Alone Available Help  at Discharge: Family;Available 24 hours/day Type of Home: Apartment Home Access: Level entry       Home Layout: One level Home Equipment: Conservation officer, nature (2 wheels);Rollator (4 wheels);Cane - single point;Electric scooter;Shower seat Additional Comments: patient uses 2-3 L at ILF.    Prior Function Prior Level of Function : Needs assist       Physical Assist : Mobility (physical) Mobility (physical): Gait   Mobility Comments: using SPC vs  rollator ADLs Comments: daughter helps sort meds and handle some errrands     Hand Dominance   Dominant Hand: Right    Extremity/Trunk Assessment   Upper Extremity Assessment Upper Extremity Assessment: Generalized weakness    Lower Extremity Assessment Lower Extremity Assessment: Generalized weakness    Cervical / Trunk Assessment Cervical / Trunk Assessment: Kyphotic;Other exceptions (has posture that may be compromising breathing well)  Communication   Communication: HOH  Cognition Arousal/Alertness: Awake/alert Behavior During Therapy: WFL for tasks assessed/performed Overall Cognitive Status: Within Functional Limits for tasks assessed                                          General Comments General comments (skin integrity, edema, etc.): pt was assisted to stand and had a dramatic drop in BP and sats with effort, although pt reports 87% is his normal    Exercises     Assessment/Plan    PT Assessment Patient needs continued PT services  PT Problem List Decreased strength;Decreased activity tolerance;Decreased balance;Cardiopulmonary status limiting activity;Decreased skin integrity       PT Treatment Interventions DME instruction;Gait training;Stair training;Functional mobility training;Therapeutic activities;Therapeutic exercise;Balance training;Neuromuscular re-education;Patient/family education    PT Goals (Current goals can be found in the Care Plan section)  Acute Rehab PT Goals Patient Stated Goal: to walk alone and go home PT Goal Formulation: With patient Time For Goal Achievement: 09/19/21 Potential to Achieve Goals: Good    Frequency Min 3X/week   Barriers to discharge Inaccessible home environment;Decreased caregiver support home alone with three stesp to enter    Co-evaluation               AM-PAC PT "6 Clicks" Mobility  Outcome Measure Help needed turning from your back to your side while in a flat bed without using  bedrails?: A Lot Help needed moving from lying on your back to sitting on the side of a flat bed without using bedrails?: A Lot Help needed moving to and from a bed to a chair (including a wheelchair)?: A Lot Help needed standing up from a chair using your arms (e.g., wheelchair or bedside chair)?: A Lot Help needed to walk in hospital room?: Total Help needed climbing 3-5 steps with a railing? : Total 6 Click Score: 10    End of Session Equipment Utilized During Treatment: Gait belt;Oxygen Activity Tolerance: Patient limited by fatigue;Treatment limited secondary to medical complications (Comment) Patient left: in bed;with call bell/phone within reach Nurse Communication: Mobility status (changes in vitals) PT Visit Diagnosis: Unsteadiness on feet (R26.81);History of falling (Z91.81);Dizziness and giddiness (R42)    Time: 2229-7989 PT Time Calculation (min) (ACUTE ONLY): 53 min   Charges:   PT Evaluation $PT Eval Moderate Complexity: 1 Mod PT Treatments $Therapeutic Activity: 23-37 mins $Neuromuscular Re-education: 8-22 mins       Ramond Dial 09/05/2021, 5:48 PM  Mee Hives, PT PhD Acute Rehab Dept. Number: [facility] [phone] and  [place] [phone]

## 2021-09-05 NOTE — Progress Notes (Signed)
Pharmacy Consult for Pulmonary Hypertension Treatment   Indication - Continuation of prior to admission medication   Patient is 87 y.o.  with history of PAH on chronic Macitentan (Opsumit) PTA and will be continued while hospitalized.   Continuing this medication order as an inpatient requires that monitoring parameters per REMS requirements must be met.  Chronic therapy is under the supervision of B[place] who is enrolled in the REMS program and is being notified of continuation of therapy. A staff message in EPIC has been sent notifying the certified prescriber.  Per patient report has previously been educated on Hepatotoxicity . On admission pregnancy risk has been assessed and no monitoring required. Hepatic function has been evaluated. Pending LFTs  Hepatic Function Latest Ref Rng & Units 07/09/2021 07/08/2021 03/09/2021  Total Protein 6.5 - 8.1 g/dL 8.2(H) 8.8(H) 6.7  Albumin 3.5 - 5.0 g/dL 3.9 4.2 3.1(L)  AST 15 - 41 U/L _0 ALT 0 - 44 U/L _1 Alk Phosphatase 38 - 126 U/L 89 91 56  Total Bilirubin 0.3 - 1.2 mg/dL 1.2 1.0 0.7  Bilirubin, Direct 0.0 - 0.2 mg/dL - - 0.2    If any question arise or pregnancy is identified during hospitalization, contact for bosentan and macitentan: [phone]; ambrisentan: [phone].  Thank for you allowing [place] to participate in the care of this patient.  Cristela Felt, PharmD, BCPS Clinical Pharmacist 09/05/2021 11:31 AM

## 2021-09-05 NOTE — ED Notes (Signed)
Hospitalist at bedside and aware of pt BP

## 2021-09-05 NOTE — Progress Notes (Signed)
This is a very pleasant 87 year old gentleman with history of pulmonary hypertension with cor pulmonale, CAD status post CABG, paroxysmal atrial fibrillation, not on any anticoagulation due to history of GI bleed, sleep apnea on BiPAP and abdominal aortic aneurysm status post repair presented to ED with a complaint of dizziness and was diagnosed with symptomatic bradycardia.  Patient seen and examined the ED.  He has no complaints at this point in time.  He denies any dizziness, chest pain, palpitation or shortness of breath.  His bradycardia is improving.  While I was in the room, his heart rate was at 64.  His blood pressure was low, systolic around 90 and diastolic 45.  We will check lactic acid.  Not sure if this is his baseline.  However his bradycardia is new.  I reviewed several EKGs in the past and he has remained within normal range or tachycardic before.  Discussed with patient in length, despite of being 87 year old, patient has great memory and great train of thoughts.  He tells me that he has been told by his cardiologist Dr. Haroldine Laws that he is not candidate for any kind of surgery and thus he is not interested in pacemaker either.  In that case, no need to consult EP cardiology and his heart rate is improving as well as mentioned above.  We will just monitor overnight.  We will get PT OT to assess him.  He lives in independent living facility by himself.  Continue current medications.  Rest of the medical issues are stable.  He has hypothyroidism, he is on Synthroid.  Will check TSH.  I noticed that he is also on midodrine 15 mg 3 times daily which indicates that he likely has chronic hypotension.  He received 1 dose of IV Lasix in the ED, only 20 mg.  Currently his blood pressure is not allowing any further diuretics.

## 2021-09-05 NOTE — Progress Notes (Signed)
   09/05/21 2021  Assess: MEWS Score  Temp 97.9 F (36.6 C)  BP (!) 98/55  Pulse Rate 62  Resp (!) 23  SpO2 98 %  O2 Device HFNC  O2 Flow Rate (L/min) 9 L/min  Assess: MEWS Score  MEWS Temp 0  MEWS Systolic 1  MEWS Pulse 0  MEWS RR 1  MEWS LOC 0  MEWS Score 2  MEWS Score Color Yellow  Assess: if the MEWS score is Yellow or Red  Were vital signs taken at a resting state? Yes  Focused Assessment No change from prior assessment  Early Detection of Sepsis Score *See Row Information* Low  MEWS guidelines implemented *See Row Information* Yes  Treat  MEWS Interventions Other (Comment) (new admit)  Pain Scale 0-10  Pain Score 0  Take Vital Signs  Increase Vital Sign Frequency  Yellow: Q 2hr X 2 then Q 4hr X 2, if remains yellow, continue Q 4hrs  Escalate  MEWS: Escalate Yellow: discuss with charge nurse/RN and consider discussing with provider and RRT  Document  Patient Outcome Other (Comment) (pt turned green after admit to floor)   Patient admission vitals yellow.  Yellow mews protocol initiated. RR elevated and BP low.

## 2021-09-05 NOTE — H&P (Signed)
History and Physical    Vincent Robinson TDH:[id number] DOB: 1923-01-08 DOA: 09/04/2021  PCP: Katherina Mires, MD  Patient coming from: Home.  Chief Complaint: Dizziness.    HPI: Vincent Robinson is a 87 y.o. male with history of pulmonary hypertension with cor pulmonale, CAD status post CABG, paroxysmal atrial fibrillation, sleep apnea on BiPAP, abdominal aortic aneurysm status post endovascular repair was brought to the ER after patient was complaining of dizziness.  Patient states that last evening he went to the bathroom to urinate when he started feeling dizzy and almost fell.  His dizziness persisted for almost 1 hour.  He was brought to the ER.  Patient states over the last 2 months he has been having frequent episodes of dizziness and last week he did have a fall.  Denies any chest pain or shortness of breath.  ED Course: In the ER patient EKG shows sinus bradycardia with heart rate around 49 bpm.  Chest x-ray shows chronic changes.  Labs show hemoglobin of 12.7 high sensitive troponin of 15 and 16 creatinine 1.1 COVID test was negative.  Patient initially required 7 L oxygen but was soon weaned off to 3 L which is what patient takes at home.  1 dose of 20 mg IV Lasix was given.  Patient admitted for further observation.  Review of Systems: As per HPI, rest all negative.   Past Medical History:  Diagnosis Date   AAA (abdominal aortic aneurysm)    Acute respiratory failure ([place]) 11/23/2019   Asthma    Basal cell carcinoma    CHF (congestive heart failure) (HCC)    Chronic respiratory failure (HCC)    COPD (chronic obstructive pulmonary disease) (HCC)    Coronary artery disease    Diverticulitis    Dysrhythmia    afib   History of chronic respiratory failure    Hyperlipidemia    Hypothyroidism    Moderate aortic stenosis    Myocardial infarction ([place])    Pneumonia    Pulmonary HTN ([place])    Right heart failure ([place])    Skin cancer 2017   bladder    Sleep apnea    Bipap     Past Surgical History:  Procedure Laterality Date   bleeding ulcer  2016   BYPASS AXILLA/BRACHIAL ARTERY  1998   colon bleed  2012   COLONOSCOPY N/A 06/07/2017   Procedure: COLONOSCOPY;  Surgeon: Carol Ada, MD;  Location: [facility];  Service: Endoscopy;  Laterality: N/A;   CORONARY ARTERY BYPASS GRAFT     5 vessel   DEBRIDMENT OF DECUBITUS ULCER  2002   EYE SURGERY     cataract   IR ANGIOGRAM SELECTIVE EACH ADDITIONAL VESSEL  03/22/2020   IR ANGIOGRAM VISCERAL SELECTIVE  03/22/2020   IR EXCHANGE BILIARY DRAIN  12/02/2020   IR EXCHANGE BILIARY DRAIN  02/08/2021   IR EXCHANGE BILIARY DRAIN  04/05/2021   IR EXCHANGE BILIARY DRAIN  06/05/2021   IR EXCHANGE BILIARY DRAIN  07/31/2021   IR PERC CHOLECYSTOSTOMY  09/08/2020   IR RADIOLOGIST EVAL & MGMT  10/06/2020   IR RADIOLOGIST EVAL & MGMT  10/26/2020   IR US GUIDE VASC ACCESS RIGHT  03/22/2020   RIGHT HEART CATH N/A 12/11/2017   Procedure: RIGHT HEART CATH;  Surgeon: Jolaine Artist, MD;  Location: [facility];  Service: Cardiovascular;  Laterality: N/A;   THORACIC AORTA STENT  2009   TRANSURETHRAL RESECTION OF BLADDER TUMOR WITH MITOMYCIN-C N/A 03/09/2021  Procedure: TRANSURETHRAL RESECTION OF BLADDER TUMOR;  Surgeon: Franchot Gallo, MD;  Location: [facility];  Service: Urology;  Laterality: N/A;     reports that he quit smoking about 60 years ago. His smoking use included cigarettes. He smoked an average of .5 packs per day. He has never used smokeless tobacco. He reports that he does not currently use alcohol. He reports that he does not use drugs.  Allergies  Allergen Reactions   Oxycodone     Other reaction(s): Delusions (intolerance), Hallucinations Wiped him out Wiped him out    Hydrocodone Other (See Comments)    Family History  Problem Relation Age of Onset   Emphysema Mother    Stroke Father    Movement disorder Sister    Aneurysm Brother        Thoracic aorta   Macular degeneration Brother     Prior  to Admission medications   Medication Sig Start Date End Date Taking? Authorizing Provider  albuterol (VENTOLIN HFA) 108 (90 Base) MCG/ACT inhaler Inhale 1 puff into the lungs every 6 (six) hours as needed for wheezing or shortness of breath.    [provider]  ferrous sulfate 325 (65 FE) MG tablet Take 325 mg by mouth daily with breakfast.    [provider]  levothyroxine (SYNTHROID) 88 MCG tablet Take 88 mcg by mouth daily before breakfast.    [provider]  macitentan (OPSUMIT) 10 MG tablet Take 1 tablet (10 mg total) by mouth daily. Needs appt for future refills 08/19/20   Bensimhon, Shaune Pascal, MD  midodrine (PROAMATINE) 10 MG tablet Take 1 tablet (10 mg total) by mouth 3 (three) times daily after meals. In addition with the 5mg  tablets Patient taking differently: Take 10 mg by mouth 3 (three) times daily. Take with a 5 mg tablet for a total dose of 15 mg 12/29/20   Bensimhon, Shaune Pascal, MD  midodrine (PROAMATINE) 5 MG tablet Take 1 tablet (5 mg total) by mouth 3 (three) times daily with meals. In addition with the 10mg  tablets Patient taking differently: Take 5 mg by mouth 3 (three) times daily. Take with a 10 mg tablet for a total dose of 15 mg 12/29/20   Bensimhon, Shaune Pascal, MD  Mometasone Furo-Formoterol Fum (DULERA IN) Inhale 1 puff into the lungs 2 (two) times daily as needed (shortness of breath).    [provider]  montelukast (SINGULAIR) 10 MG tablet Take 10 mg by mouth at bedtime.    [provider]  pantoprazole (PROTONIX) 20 MG tablet Take 20 mg by mouth every morning.    [provider]  potassium chloride SA (KLOR-CON) 10 MEQ tablet Take 2 tablets (20 mEq total) by mouth in the morning and at bedtime. 07/18/21   Bensimhon, Shaune Pascal, MD  psyllium (METAMUCIL SMOOTH TEXTURE) 58.6 % powder Take 1 packet by mouth 3 (three) times daily. 07/18/21   Bensimhon, Shaune Pascal, MD  sildenafil (REVATIO) 20 MG tablet TAKE TWO TABLETS BY MOUTH THREE  TIMES A DAY Patient taking differently: Take 40 mg by mouth 3 (three) times daily. For pulmonary hypertension 11/02/20   Lyda Jester M, PA-C  simvastatin (ZOCOR) 10 MG tablet Take 10 mg by mouth at bedtime.    [provider]  Sodium Chloride Flush (NORMAL SALINE FLUSH) 0.9 % SOLN USE 5 MLS 2 TIMES DAILY AS DIRECTED Patient taking differently: 5 mLs See admin instructions. Flush gall bladder drain with 5 mls twice weekly 09/22/20 09/22/21  Clovis Riley,  MD  torsemide (DEMADEX) 20 MG tablet TAKE TWO TABLETS BY MOUTH DAILY Patient taking differently: No sig reported 01/11/21   Bensimhon, Shaune Pascal, MD  Vitamin D, Ergocalciferol, (DRISDOL) 1.25 MG (50000 UNIT) CAPS capsule Take 50,000 Units by mouth every Sunday. 05/26/21   [provider]    Physical Exam: Constitutional: Moderately built and nourished. Vitals:   09/04/21 2345 09/05/21 0015 09/05/21 0100 09/05/21 0130  BP: 107/82 (!) 98/52 (!) 90/48 (!) 110/46  Pulse: 64 (!) 44 (!) 42 (!) 51  Resp: 11 14 12 20   Temp:      TempSrc:      SpO2: 97% 94% 96% 91%   Eyes: Anicteric no pallor. ENMT: No discharge from the ears eyes nose and mouth. Neck: No mass felt.  No neck rigidity. Respiratory: No rhonchi or crepitations. Cardiovascular: S1-S2 heard. Abdomen: Soft nontender bowel sound present. Musculoskeletal: No edema.  Wears stockings. Skin: No rash. Neurologic: Alert awake oriented to time place and person.  Moves all extremities. Psychiatric: Appears normal.  Normal affect.   Labs on Admission: I have personally reviewed following labs and imaging studies  CBC: Recent Labs  Lab 09/04/21 2222  WBC 8.7  NEUTROABS 4.9  HGB 12.7*  HCT 38.6*  MCV 91.9  PLT 500   Basic Metabolic Panel: Recent Labs  Lab 09/04/21 2222  NA 137  K 3.5  CL 101  CO2 26  GLUCOSE 131*  BUN 18  CREATININE 1.18  CALCIUM 8.2*   GFR: CrCl cannot be calculated (Unknown ideal weight.). Liver Function Tests: No results  for input(s): AST, ALT, ALKPHOS, BILITOT, PROT, ALBUMIN in the last 168 hours. No results for input(s): LIPASE, AMYLASE in the last 168 hours. No results for input(s): AMMONIA in the last 168 hours. Coagulation Profile: No results for input(s): INR, PROTIME in the last 168 hours. Cardiac Enzymes: No results for input(s): CKTOTAL, CKMB, CKMBINDEX, TROPONINI in the last 168 hours. BNP (last 3 results) No results for input(s): PROBNP in the last 8760 hours. HbA1C: No results for input(s): HGBA1C in the last 72 hours. CBG: No results for input(s): GLUCAP in the last 168 hours. Lipid Profile: No results for input(s): CHOL, HDL, LDLCALC, TRIG, CHOLHDL, LDLDIRECT in the last 72 hours. Thyroid Function Tests: No results for input(s): TSH, T4TOTAL, FREET4, T3FREE, THYROIDAB in the last 72 hours. Anemia Panel: No results for input(s): VITAMINB12, FOLATE, FERRITIN, TIBC, IRON, RETICCTPCT in the last 72 hours. Urine analysis:    Component Value Date/Time   COLORURINE YELLOW 09/05/2021 0230   APPEARANCEUR HAZY (A) 09/05/2021 0230   LABSPEC 1.010 09/05/2021 0230   PHURINE 6.0 09/05/2021 0230   GLUCOSEU NEGATIVE 09/05/2021 0230   HGBUR NEGATIVE 09/05/2021 0230   BILIRUBINUR NEGATIVE 09/05/2021 0230   KETONESUR NEGATIVE 09/05/2021 0230   PROTEINUR NEGATIVE 09/05/2021 0230   NITRITE NEGATIVE 09/05/2021 0230   LEUKOCYTESUR LARGE (A) 09/05/2021 0230   Sepsis Labs: @LABRCNTIP (procalcitonin:4,lacticidven:4) ) Recent Results (from the past 240 hour(s))  Resp Panel by RT-PCR (Flu A&B, Covid) Nasopharyngeal Swab     Status: None   Collection Time: 09/04/21 10:13 PM   Specimen: Nasopharyngeal Swab; Nasopharyngeal(NP) swabs in vial transport medium  Result Value Ref Range Status   SARS Coronavirus 2 by RT PCR NEGATIVE NEGATIVE Final    Comment: (NOTE) SARS-CoV-2 target nucleic acids are NOT DETECTED.  The SARS-CoV-2 RNA is generally detectable in upper respiratory specimens during the acute  phase of infection. The lowest concentration of SARS-CoV-2 viral copies this assay can detect  is 138 copies/mL. A negative result does not preclude SARS-Cov-2 infection and should not be used as the sole basis for treatment or other patient management decisions. A negative result may occur with  improper specimen collection/handling, submission of specimen other than nasopharyngeal swab, presence of viral mutation(s) within the areas targeted by this assay, and inadequate number of viral copies(<138 copies/mL). A negative result must be combined with clinical observations, patient history, and epidemiological information. The expected result is Negative.  Fact Sheet for Patients:  EntrepreneurPulse.com.au  Fact Sheet for Healthcare Providers:  IncredibleEmployment.be  This test is no t yet approved or cleared by the [place] FDA and  has been authorized for detection and/or diagnosis of SARS-CoV-2 by FDA under an Emergency Use Authorization (EUA). This EUA will remain  in effect (meaning this test can be used) for the duration of the COVID-19 declaration under Section 564(b)(1) of the Act, 21 U.S.C.section 360bbb-3(b)(1), unless the authorization is terminated  or revoked sooner.       Influenza A by PCR NEGATIVE NEGATIVE Final   Influenza B by PCR NEGATIVE NEGATIVE Final    Comment: (NOTE) The Xpert Xpress SARS-CoV-2/FLU/RSV plus assay is intended as an aid in the diagnosis of influenza from Nasopharyngeal swab specimens and should not be used as a sole basis for treatment. Nasal washings and aspirates are unacceptable for Xpert Xpress SARS-CoV-2/FLU/RSV testing.  Fact Sheet for Patients: EntrepreneurPulse.com.au  Fact Sheet for Healthcare Providers: IncredibleEmployment.be  This test is not yet approved or cleared by the [place] FDA and has been authorized for detection and/or diagnosis of  SARS-CoV-2 by FDA under an Emergency Use Authorization (EUA). This EUA will remain in effect (meaning this test can be used) for the duration of the COVID-19 declaration under Section 564(b)(1) of the Act, 21 U.S.C. section 360bbb-3(b)(1), unless the authorization is terminated or revoked.  Performed at [facility], [address] [place],  [address]      Radiological Exams on Admission: DG Chest Portable 1 View  Result Date: 09/04/2021 CLINICAL DATA:  Near syncope EXAM: PORTABLE CHEST 1 VIEW COMPARISON:  07/08/2021 FINDINGS: Prior CABG. Cardiomegaly. Chronic changes/scarring throughout the lungs. No acute confluent opacities or effusions. IMPRESSION: Cardiomegaly, chronic changes. No active disease. Electronically Signed   By: Rolm Baptise M.D.   On: 09/04/2021 23:03    EKG: Independently reviewed.  Sinus bradycardia heart rate around 49 bpm with RBBB.  Assessment/Plan Principal Problem:   Near syncope Active Problems:   COPD (chronic obstructive pulmonary disease) (HCC)   ILD (interstitial lung disease) (HCC)   Hypothyroidism   AAA (abdominal aortic aneurysm) without rupture   Atrial fibrillation (HCC)   S/P CABG (coronary artery bypass graft)    Near syncope -patient was mildly bradycardic at presentation.  We will continue to monitor in telemetry.  Check orthostatic and physical therapy consult.  Appears nonfocal. History of pulmonary hypertension with cor pulmonale -patient was initially requiring 7 L oxygen.  20 mg IV Lasix was given.  Patient was soon weaned off to 3 L oxygen.  Continue patient's home medication including macitentan, sildenafil torsemide and midodrine. History of paroxysmal atrial fibrillation not on anticoagulation secondary to GI bleed. History of CAD status post stenting on aspirin and statins. COPD not actively wheezing. History of sleep apnea on BiPAP at bedtime. Anemia follow CBC. History of moderate aortic stenosis being followed by  cardiology. History of endovascular repair of abdominal aortic aneurysm.   DVT prophylaxis: Lovenox. Code Status: Partial code  no intubation. Family Communication: Discussed with patient. Disposition Plan: Home when stable. Consults called: None. Admission status: Observation.   Rise Patience MD [facility] Pager [phone].  If 7PM-7AM, please contact night-coverage [email or web address] Password [email or web address]  09/05/2021, 5:48 AM

## 2021-09-06 DIAGNOSIS — R55 Syncope and collapse: Secondary | ICD-10-CM | POA: Diagnosis not present

## 2021-09-06 DIAGNOSIS — R001 Bradycardia, unspecified: Secondary | ICD-10-CM

## 2021-09-06 NOTE — Progress Notes (Signed)
Pt d/c with daughter papers with instructions.

## 2021-09-06 NOTE — Discharge Summary (Signed)
Physician Discharge Summary  Vincent Robinson SNK:[id number] DOB: Jan 30, 1923 DOA: 09/04/2021  PCP: Katherina Mires, MD  Admit date: 09/04/2021 Discharge date: 09/06/2021 30 Day Unplanned Readmission Risk Score    Flowsheet Row ED to Hosp-Admission (Discharged) from 07/08/2021 in [facility]  30 Day Unplanned Readmission Risk Score (%) 30.2 Filed at 07/11/2021 1200       This score is the patient's risk of an unplanned readmission within 30 days of being discharged (0 -100%). The score is based on dignosis, age, lab data, medications, orders, and past utilization.   Low:  0-14.9   Medium: 15-21.9   High: 22-29.9   Extreme: 30 and above          Admitted From: Home Disposition: Home  Recommendations for Outpatient Follow-up:  Follow up with PCP in 1-2 weeks Please obtain BMP/CBC in one week Follow-up with cardiology in 1 to 2 weeks Please follow up with your PCP on the following pending results: Unresulted Labs (From admission, onward)     Start     Ordered   09/12/21 0500  Creatinine, serum  (enoxaparin (LOVENOX)    CrCl >/= 30 ml/min)  Weekly,   R     Comments: while on enoxaparin therapy    09/05/21 0547   09/05/21 0546  CBC  (enoxaparin (LOVENOX)    CrCl >/= 30 ml/min)  Once,   R       Comments: Baseline for enoxaparin therapy IF NOT ALREADY DRAWN.  Notify MD if PLT < 100 K.    09/05/21 0547              Home Health: Yes Equipment/Devices: None  Discharge Condition: Stable CODE STATUS: Partial code Diet recommendation: Low-sodium  Subjective: Seen and examined.  He feels much better.  No shortness of breath, chest pain or palpitation or any dizziness.  Discussed in length with him about PT and OT recommendations for him to go to SNF.  Despite of lengthy discussion, he declined SNF and wants to go home with home health.  Patient's RN witnessed the whole conversation.  Per patient's request, he is being discharged home  today.  Brief/Interim Summary: This is a very pleasant 87 year old gentleman with a history of pulmonary hypertension with cor pulmonale, CAD s/p CABG, PAF not on any anticoagulation due to history of GI bleed, sleep apnea on BiPAP and abdominal aortic aneurysm status postrepair presented to ED with a complaint of dizziness and was diagnosed with symptomatic bradycardia.  He was admitted under hospitalist service for observation.  Patient's heart rate went as low as in upper 30s but patient did not have any symptoms at that point in time.  He was otherwise hemodynamically remained stable with low blood pressure which is his baseline and he is on midodrine 15 mg p.o. 3 times daily for that.  Patient seen and examined yesterday and today.  Patient has remained completely asymptomatic.  His heart rate has improved.  Sometimes it dips into the low 50s but he remains asymptomatic.  Rest of the work-up was negative.  When discussed with patient in detail yesterday about potential need of pacemaker and consulting EP cardiology, patient verbalized that his doctors, his cardiologist has told him that they would not want him to have any sort of surgery and he was also not interested in having any pacemaker.  Him being asymptomatic for more than 24 hours indicates that he probably does not need one either.  PT  OT recommends SNF, he does not want to go there and he wants to go home so he is being discharged home in stable condition.  I have resumed all his home medications.  Of note, he uses 3 L of oxygen chronically at home and he is on 3 L currently.  Discharge Diagnoses:  Principal Problem:   Near syncope Active Problems:   COPD (chronic obstructive pulmonary disease) (HCC)   ILD (interstitial lung disease) (HCC)   Hypothyroidism   AAA (abdominal aortic aneurysm) without rupture   Atrial fibrillation (HCC)   S/P CABG (coronary artery bypass graft)   Symptomatic sinus bradycardia    Discharge  Instructions   Allergies as of 09/06/2021       Reactions   Oxycodone    Other reaction(s): Delusions (intolerance), Hallucinations Wiped him out Wiped him out   Hydrocodone Other (See Comments)        Medication List     STOP taking these medications    DULERA IN   Metamucil Smooth Texture 58.6 % powder Generic drug: psyllium       TAKE these medications    albuterol 108 (90 Base) MCG/ACT inhaler Commonly known as: VENTOLIN HFA Inhale 1 puff into the lungs every 6 (six) hours as needed for wheezing or shortness of breath.   ferrous sulfate 325 (65 FE) MG tablet Take 325 mg by mouth daily with breakfast.   levothyroxine 88 MCG tablet Commonly known as: SYNTHROID Take 88 mcg by mouth daily before breakfast.   midodrine 10 MG tablet Commonly known as: PROAMATINE Take 1 tablet (10 mg total) by mouth 3 (three) times daily after meals. In addition with the 5mg  tablets What changed:  when to take this additional instructions   midodrine 5 MG tablet Commonly known as: PROAMATINE Take 1 tablet (5 mg total) by mouth 3 (three) times daily with meals. In addition with the 10mg  tablets What changed:  when to take this additional instructions   montelukast 10 MG tablet Commonly known as: SINGULAIR Take 10 mg by mouth at bedtime.   Normal Saline Flush 0.9 % Soln USE 5 MLS 2 TIMES DAILY AS DIRECTED What changed:  how much to take when to take this additional instructions   Opsumit 10 MG tablet Generic drug: macitentan Take 1 tablet (10 mg total) by mouth daily. Needs appt for future refills   pantoprazole 20 MG tablet Commonly known as: PROTONIX Take 20 mg by mouth every morning.   potassium chloride 10 MEQ tablet Commonly known as: KLOR-CON Take 2 tablets (20 mEq total) by mouth in the morning and at bedtime.   sildenafil 20 MG tablet Commonly known as: REVATIO TAKE TWO TABLETS BY MOUTH THREE TIMES A DAY What changed: See the new instructions.    simvastatin 10 MG tablet Commonly known as: ZOCOR Take 10 mg by mouth at bedtime.   torsemide 20 MG tablet Commonly known as: DEMADEX TAKE TWO TABLETS BY MOUTH DAILY What changed: when to take this   Vitamin D (Ergocalciferol) 1.25 MG (50000 UNIT) Caps capsule Commonly known as: DRISDOL Take 50,000 Units by mouth every Sunday.        Follow-up Information     Katherina Mires, MD Follow up in 1 week(s).   Specialty: Family Medicine Contact information: [place] [address] [phone]         Lorretta Harp, MD .   Specialties: Cardiology, Radiology Contact information: [address] [place]  [place] [address] [phone]                Allergies  Allergen Reactions   Oxycodone     Other reaction(s): Delusions (intolerance), Hallucinations Wiped him out Wiped him out    Hydrocodone Other (See Comments)    Consultations: None   Procedures/Studies: DG Chest Portable 1 View  Result Date: 09/04/2021 CLINICAL DATA:  Near syncope EXAM: PORTABLE CHEST 1 VIEW COMPARISON:  07/08/2021 FINDINGS: Prior CABG. Cardiomegaly. Chronic changes/scarring throughout the lungs. No acute confluent opacities or effusions. IMPRESSION: Cardiomegaly, chronic changes. No active disease. Electronically Signed   By: Rolm Baptise M.D.   On: 09/04/2021 23:03     Discharge Exam: Vitals:   09/06/21 0832 09/06/21 1000  BP: (!) 103/53   Pulse: 78   Resp: 18   Temp: 97.8 F (36.6 C)   SpO2: 91% 94%   Vitals:   09/06/21 0013 09/06/21 0325 09/06/21 0832 09/06/21 1000  BP: (!) 98/53 (!) 91/51 (!) 103/53   Pulse: (!) 55 (!) 56 78   Resp: 20 17 18    Temp: 97.9 F (36.6 C) 97.7 F (36.5 C) 97.8 F (36.6 C)   TempSrc: Oral Oral Oral   SpO2: 97% 95% 91% 94%  Weight:  67.1 kg    Height:        General: Pt is alert, awake, not in acute distress Cardiovascular: RRR, S1/S2 +, no rubs, no gallops Respiratory: CTA bilaterally,  no wheezing, no rhonchi Abdominal: Soft, NT, ND, bowel sounds + Extremities: no edema, no cyanosis    The results of significant diagnostics from this hospitalization (including imaging, microbiology, ancillary and laboratory) are listed below for reference.     Microbiology: Recent Results (from the past 240 hour(s))  Resp Panel by RT-PCR (Flu A&B, Covid) Nasopharyngeal Swab     Status: None   Collection Time: 09/04/21 10:13 PM   Specimen: Nasopharyngeal Swab; Nasopharyngeal(NP) swabs in vial transport medium  Result Value Ref Range Status   SARS Coronavirus 2 by RT PCR NEGATIVE NEGATIVE Final    Comment: (NOTE) SARS-CoV-2 target nucleic acids are NOT DETECTED.  The SARS-CoV-2 RNA is generally detectable in upper respiratory specimens during the acute phase of infection. The lowest concentration of SARS-CoV-2 viral copies this assay can detect is 138 copies/mL. A negative result does not preclude SARS-Cov-2 infection and should not be used as the sole basis for treatment or other patient management decisions. A negative result may occur with  improper specimen collection/handling, submission of specimen other than nasopharyngeal swab, presence of viral mutation(s) within the areas targeted by this assay, and inadequate number of viral copies(<138 copies/mL). A negative result must be combined with clinical observations, patient history, and epidemiological information. The expected result is Negative.  Fact Sheet for Patients:  EntrepreneurPulse.com.au  Fact Sheet for Healthcare Providers:  IncredibleEmployment.be  This test is no t yet approved or cleared by the [place] FDA and  has been authorized for detection and/or diagnosis of SARS-CoV-2 by FDA under an Emergency Use Authorization (EUA). This EUA will remain  in effect (meaning this test can be used) for the duration of the COVID-19 declaration under Section 564(b)(1) of the  Act, 21 U.S.C.section 360bbb-3(b)(1), unless the authorization is terminated  or revoked sooner.       Influenza A by PCR NEGATIVE NEGATIVE Final   Influenza B by PCR NEGATIVE NEGATIVE Final    Comment: (NOTE) The Xpert Xpress SARS-CoV-2/FLU/RSV plus assay is intended as an aid in the  diagnosis of influenza from Nasopharyngeal swab specimens and should not be used as a sole basis for treatment. Nasal washings and aspirates are unacceptable for Xpert Xpress SARS-CoV-2/FLU/RSV testing.  Fact Sheet for Patients: EntrepreneurPulse.com.au  Fact Sheet for Healthcare Providers: IncredibleEmployment.be  This test is not yet approved or cleared by the [place] FDA and has been authorized for detection and/or diagnosis of SARS-CoV-2 by FDA under an Emergency Use Authorization (EUA). This EUA will remain in effect (meaning this test can be used) for the duration of the COVID-19 declaration under Section 564(b)(1) of the Act, 21 U.S.C. section 360bbb-3(b)(1), unless the authorization is terminated or revoked.  Performed at [facility], [place] [address], [place], [place] [address]      Labs: BNP (last 3 results) Recent Labs    07/08/21 2100 07/18/21 1557 09/04/21 2222  BNP 444.5* 408.2* 237.6*   Basic Metabolic Panel: Recent Labs  Lab 09/04/21 2222 09/05/21 0911  NA 137 138  K 3.5 3.7  CL 101 100  CO2 26 30  GLUCOSE 131* 89  BUN 18 15  CREATININE 1.18 1.10  CALCIUM 8.2* 8.2*  MG  --  2.4   Liver Function Tests: Recent Labs  Lab 09/05/21 1129  AST 54*  ALT 25  ALKPHOS 102  BILITOT 1.3*  PROT 6.9  ALBUMIN 3.1*   No results for input(s): LIPASE, AMYLASE in the last 168 hours. No results for input(s): AMMONIA in the last 168 hours. CBC: Recent Labs  Lab 09/04/21 2222 09/05/21 0911  WBC 8.7 6.5  NEUTROABS 4.9 3.7  HGB 12.7* 12.0*  HCT 38.6* 37.2*  MCV 91.9 92.5  PLT 263 230   Cardiac Enzymes: No results for  input(s): CKTOTAL, CKMB, CKMBINDEX, TROPONINI in the last 168 hours. BNP: Invalid input(s): POCBNP CBG: No results for input(s): GLUCAP in the last 168 hours. D-Dimer No results for input(s): DDIMER in the last 72 hours. Hgb A1c No results for input(s): HGBA1C in the last 72 hours. Lipid Profile No results for input(s): CHOL, HDL, LDLCALC, TRIG, CHOLHDL, LDLDIRECT in the last 72 hours. Thyroid function studies Recent Labs    09/05/21 0911  TSH 1.857   Anemia work up No results for input(s): VITAMINB12, FOLATE, FERRITIN, TIBC, IRON, RETICCTPCT in the last 72 hours. Urinalysis    Component Value Date/Time   COLORURINE YELLOW 09/05/2021 0230   APPEARANCEUR HAZY (A) 09/05/2021 0230   LABSPEC 1.010 09/05/2021 0230   PHURINE 6.0 09/05/2021 0230   GLUCOSEU NEGATIVE 09/05/2021 0230   HGBUR NEGATIVE 09/05/2021 0230   BILIRUBINUR NEGATIVE 09/05/2021 0230   KETONESUR NEGATIVE 09/05/2021 0230   PROTEINUR NEGATIVE 09/05/2021 0230   NITRITE NEGATIVE 09/05/2021 0230   LEUKOCYTESUR LARGE (A) 09/05/2021 0230   Sepsis Labs Invalid input(s): PROCALCITONIN,  WBC,  LACTICIDVEN Microbiology Recent Results (from the past 240 hour(s))  Resp Panel by RT-PCR (Flu A&B, Covid) Nasopharyngeal Swab     Status: None   Collection Time: 09/04/21 10:13 PM   Specimen: Nasopharyngeal Swab; Nasopharyngeal(NP) swabs in vial transport medium  Result Value Ref Range Status   SARS Coronavirus 2 by RT PCR NEGATIVE NEGATIVE Final    Comment: (NOTE) SARS-CoV-2 target nucleic acids are NOT DETECTED.  The SARS-CoV-2 RNA is generally detectable in upper respiratory specimens during the acute phase of infection. The lowest concentration of SARS-CoV-2 viral copies this assay can detect is 138 copies/mL. A negative result does not preclude SARS-Cov-2 infection and should not be used as the sole basis for treatment or  other patient management decisions. A negative result may occur with  improper specimen  collection/handling, submission of specimen other than nasopharyngeal swab, presence of viral mutation(s) within the areas targeted by this assay, and inadequate number of viral copies(<138 copies/mL). A negative result must be combined with clinical observations, patient history, and epidemiological information. The expected result is Negative.  Fact Sheet for Patients:  EntrepreneurPulse.com.au  Fact Sheet for Healthcare Providers:  IncredibleEmployment.be  This test is no t yet approved or cleared by the [place] FDA and  has been authorized for detection and/or diagnosis of SARS-CoV-2 by FDA under an Emergency Use Authorization (EUA). This EUA will remain  in effect (meaning this test can be used) for the duration of the COVID-19 declaration under Section 564(b)(1) of the Act, 21 U.S.C.section 360bbb-3(b)(1), unless the authorization is terminated  or revoked sooner.       Influenza A by PCR NEGATIVE NEGATIVE Final   Influenza B by PCR NEGATIVE NEGATIVE Final    Comment: (NOTE) The Xpert Xpress SARS-CoV-2/FLU/RSV plus assay is intended as an aid in the diagnosis of influenza from Nasopharyngeal swab specimens and should not be used as a sole basis for treatment. Nasal washings and aspirates are unacceptable for Xpert Xpress SARS-CoV-2/FLU/RSV testing.  Fact Sheet for Patients: EntrepreneurPulse.com.au  Fact Sheet for Healthcare Providers: IncredibleEmployment.be  This test is not yet approved or cleared by the [place] FDA and has been authorized for detection and/or diagnosis of SARS-CoV-2 by FDA under an Emergency Use Authorization (EUA). This EUA will remain in effect (meaning this test can be used) for the duration of the COVID-19 declaration under Section 564(b)(1) of the Act, 21 U.S.C. section 360bbb-3(b)(1), unless the authorization is terminated or revoked.  Performed at [facility], [place] [address] [place], [place] [address]      Time coordinating discharge: Over 30 minutes  SIGNED:   Darliss Cheney, MD  [facility] 09/06/2021, 11:09 AM  If 7PM-7AM, please contact night-coverage [email or web address]

## 2021-09-06 NOTE — TOC Transition Note (Signed)
Transition of Care [facility]) - CM/SW Discharge Note   Patient Details  Name: [name] MRN: [id number] Date of Birth: 29-Sep-1923  Transition of Care [facility]) CM/SW Contact:  [name], RN Phone Number: 09/06/2021, 2:45 PM   Clinical Narrative:    Patient is set up with [facility], daughter will transport him home and she has his home oxygen with her.    Final next level of care: [place] Barriers to Discharge: No Barriers Identified   Patient Goals and CMS Choice Patient states their goals for this hospitalization and ongoing recovery are:: return home with daughter CMS Medicare.gov Compare Post Acute Care list provided to:: Patient Choice offered to / list presented to : Patient  Discharge Placement                       Discharge Plan and Services In-house Referral: Clinical Social Work   Post Acute Care Choice: NA            DME Agency: NA       HH Arranged: RN [place] Agency: [place] Date [facility] Agency Contacted: 09/06/21 Time [place]: [id number] Representative spoke with at [place]: [place] ([place]) Interventions     Readmission Risk Interventions Readmission Risk Prevention Plan 07/11/2021 09/19/2020 03/25/2020  Transportation Screening Complete Complete Complete  PCP or Specialist Appt within 5-7 Days - - -  Home Care Screening - - -  Medication Review (RN CM) - - -  [place] or [place] - Complete Patient refused  Social Work Consult for [place] Planning/Counseling - Complete -  Palliative Care Screening - Complete Not Applicable  Medication Review Press photographer) Complete Complete Complete  PCP or Specialist appointment within 3-5 days of discharge Complete - -  [place] or Home Care Consult Complete - -  SW Recovery Care/Counseling Consult Complete - -  Palliative Care Screening Not Applicable - -  [place] Not Applicable - -  Some recent data  might be hidden

## 2021-09-06 NOTE — Significant Event (Signed)
SATURATION QUALIFICATIONS: (This note is used to comply with regulatory documentation for home oxygen)  Patient Saturations on Room Air at Rest = 83%  Patient Saturations on Room Air while Ambulating = 84%  Patient Saturations on 3 Liters of oxygen while Ambulating = 87-94%  Please briefly explain why patient needs home oxygen: Chronic 02 use

## 2021-09-06 NOTE — TOC Progression Note (Addendum)
Transition of Care [facility]) - Progression Note    Patient Details  Name: Vincent Robinson MRN: [id number] Date of Birth: July 03, 1923  [facility]) CM/SW Contact  [name], RN Phone Number: 09/06/2021, 2:31 PM  Clinical Narrative:    NCM received notification from [place] that patient has refused SNF and wants HH. NCM spoke with patient at bedside, he states he lives alone and has home oxygen with  [employer] (3liters) .  NCM offered choice, he states he is active with  [facility] , NCM will confirm with [facility].   Patient has , cane, scooter, walker. He states his daughter , [name] takes him to doctors apts.  She will also transport him home at discharge. He states he will stay with his daughter for a couple of weeks.  NCM was given verbal permission to contact his daughter, [name].  She states patient can stay with her for a couple of weeks her address is [place], [place] [address].  Patient states he also wears a medical alert. Patient states he would like to continue with the [facility] and he does not want to do any physical therapy.  NCM asked MD for [facility] order.     Expected Discharge Plan: Skilled Nursing Facility Barriers to Discharge: Barriers Resolved  Expected Discharge Plan and Services Expected Discharge Plan: [place] In-house Referral: Clinical Social Work   Post Acute Care Choice: NA Living arrangements for the past 2 months: Single Family Home                                       Social Determinants of Health (SDOH) Interventions    Readmission Risk Interventions Readmission Risk Prevention Plan 07/11/2021 09/19/2020 03/25/2020  Transportation Screening Complete Complete Complete  PCP or Specialist Appt within 5-7 Days - - -  Home Care Screening - - -  Medication Review (RN CM) - - -  [place] or [place] - Complete Patient refused  Social Work Consult for [place] Planning/Counseling - Complete -  Palliative  Care Screening - Complete Not Applicable  Medication Review Press photographer) Complete Complete Complete  PCP or Specialist appointment within 3-5 days of discharge Complete - -  [place] or Home Care Consult Complete - -  SW Recovery Care/Counseling Consult Complete - -  Palliative Care Screening Not Applicable - -  [place] Not Applicable - -  Some recent data might be hidden

## 2021-09-06 NOTE — Evaluation (Signed)
Occupational Therapy Evaluation Patient Details Name: Vincent Robinson MRN: [id number] DOB: Aug 07, 1923 Today's Date: 09/06/2021   History of Present Illness 87 year old male who presented to the hosptial on 11/14 with dizziness, light headed feeling.  Pt was brought to  hosp with low BP, hypoxia on monitor and increased his O2 sats.  Pt is not SOB but cannot tolerate much standing without desaturating.  PMH: asthma, COPD, chronic respiratory failure on home O2 2-3L, CAD, Hyperlipidemia, chronic diastolic CHF, [id number], cor pulmonale, diverticulitis, basal cell CA, CHF   Clinical Impression   PTA, pt recently moved to ALF (approx 1 month ago) and reports typically Modified Independent with ADLs and mobility using Rollator inside and cane vs RW outside of apartment. Pt endorses 2-3 falls since hospitalization 9/22. Pt presents now fairly close to functional baseline, able to mobilize using RW on a min guard basis for safety. Pt overall Setup for UB ADLs and Min A for LB ADLs due to some difficulty reaching B feet. Pt declines SNF rehab at this time and due to being close to baseline, would recommend HHOT follow-up to maximize safety in ALF environment. At DC, pt may require additional assist with LB ADLs and relevant IADLs initially. Will continue to follow acutely for fall prevention education, UB strengthening and safety with ADLs.   SpO2 90-91% on 9-10 L HFNC, able to titrate to 7 L HFNC with SpO2 92% BP soft, but stable (reports this is baseline) BP lying: 91/66 (74) BP sitting: 98/53 (66) BP post activity: 108/65     Recommendations for follow up therapy are one component of a multi-disciplinary discharge planning process, led by the attending physician.  Recommendations may be updated based on patient status, additional functional criteria and insurance authorization.   Follow Up Recommendations  Home health OT (declines SNF rehab)    Assistance Recommended at Discharge Set up  Supervision/Assistance  Functional Status Assessment  Patient has had a recent decline in their functional status and demonstrates the ability to make significant improvements in function in a reasonable and predictable amount of time.  Equipment Recommendations  None recommended by OT    Recommendations for Other Services       Precautions / Restrictions Precautions Precautions: Fall;Other (comment) Precaution Comments: monitor vitals, drain Restrictions Weight Bearing Restrictions: No      Mobility Bed Mobility Overal bed mobility: Needs Assistance Bed Mobility: Supine to Sit;Sit to Supine     Supine to sit: Supervision;HOB elevated Sit to supine: Supervision;HOB elevated        Transfers Overall transfer level: Needs assistance Equipment used: Rolling walker (2 wheels) Transfers: Sit to/from Stand Sit to Stand: Supervision           General transfer comment: close supervision, no assist needed to power up      Balance Overall balance assessment: Needs assistance;History of Falls Sitting-balance support: Feet supported Sitting balance-Leahy Scale: Good     Standing balance support: Bilateral upper extremity supported;During functional activity Standing balance-Leahy Scale: Fair Standing balance comment: fair static standing at sink for ADLs, use of B UE support for mobility                           ADL either performed or assessed with clinical judgement   ADL Overall ADL's : Needs assistance/impaired Eating/Feeding: Independent;Sitting   Grooming: Supervision/safety;Standing;Oral care Grooming Details (indicate cue type and reason): standing at sink to manage dentures, stood 4-5 min for task,  denies SOB Upper Body Bathing: Set up;Sitting   Lower Body Bathing: Minimal assistance;Sit to/from stand   Upper Body Dressing : Set up;Sitting   Lower Body Dressing: Minimal assistance;Sit to/from stand Lower Body Dressing Details (indicate cue  type and reason): assist to manage socks (wears stockings and slip on shoes at baseline) Toilet Transfer: Min guard;Ambulation;Rolling walker (2 wheels)   Toileting- Clothing Manipulation and Hygiene: Min guard;Sit to/from stand       Functional mobility during ADLs: Min guard;Rolling walker (2 wheels) General ADL Comments: Appears fairly close to baseline, discussed fall prevention strategies, activity tolerance and use of appropriate DME     Vision Baseline Vision/History: 1 Wears glasses Ability to See in Adequate Light: 0 Adequate Vision Assessment?: No apparent visual deficits     Perception     Praxis      Pertinent Vitals/Pain Pain Assessment: No/denies pain     Hand Dominance Right   Extremity/Trunk Assessment Upper Extremity Assessment Upper Extremity Assessment: Generalized weakness   Lower Extremity Assessment Lower Extremity Assessment: Defer to PT evaluation   Cervical / Trunk Assessment Cervical / Trunk Assessment: Kyphotic   Communication Communication Communication: HOH   Cognition Arousal/Alertness: Awake/alert Behavior During Therapy: WFL for tasks assessed/performed Overall Cognitive Status: Within Functional Limits for tasks assessed                                 General Comments: sharp witted, pleasant, hyperverbose     General Comments  BP soft but stable, SpO2 WFL on 9-10 L (tank limitations) and able to titrate to 7 L O2 with SpO2 WFL. Pt reports his MD told him O2 leveels 87%-94% were appropriate for him    Exercises     Shoulder Instructions      Home Living Family/patient expects to be discharged to:: Assisted living Living Arrangements: Alone;Other (Comment) (staff at [facility]) Available Help at Discharge: Family;Available 24 hours/day;Other (Comment);Home health (ALF staff) Type of Home: Apartment Home Access: Level entry     Home Layout: One level     Bathroom Shower/Tub: Walk-in shower;Tub/shower unit    Bathroom Toilet: Standard Bathroom Accessibility: Yes   Home Equipment: Conservation officer, nature (2 wheels);Rollator (4 wheels);Cane - single point;Electric scooter;Shower seat   Additional Comments: uses 3 L O2 at baseline, recently moved to ALF a month ago      Prior Functioning/Environment Prior Level of Function : Needs assist       Physical Assist : ADLs (physical)   ADLs (physical): IADLs Mobility Comments: Uses Rollator more recently in apartment, RW outside of the home vs walking stick. Will use scooter if in community due to inability to walk longer distances without getting SOB ADLs Comments: Reports able to complete ADLs (wears stockings and slip on shoes), sponge bathes due to drain, assist for med mgmt and IADLs as needed        OT Problem List: Decreased strength;Decreased activity tolerance;Impaired balance (sitting and/or standing);Cardiopulmonary status limiting activity      OT Treatment/Interventions: Self-care/ADL training;Therapeutic exercise;Energy conservation;DME and/or AE instruction;Therapeutic activities;Patient/family education;Balance training    OT Goals(Current goals can be found in the care plan section) Acute Rehab OT Goals Patient Stated Goal: return to ALF OT Goal Formulation: With patient Time For Goal Achievement: 09/20/21 Potential to Achieve Goals: Good  OT Frequency: Min 2X/week   Barriers to D/C:            Co-evaluation  AM-PAC OT "6 Clicks" Daily Activity     Outcome Measure Help from another person eating meals?: None Help from another person taking care of personal grooming?: A Little Help from another person toileting, which includes using toliet, bedpan, or urinal?: A Little Help from another person bathing (including washing, rinsing, drying)?: A Little Help from another person to put on and taking off regular upper body clothing?: A Little Help from another person to put on and taking off regular lower body  clothing?: A Little 6 Click Score: 19   End of Session Equipment Utilized During Treatment: Gait belt;Rolling walker (2 wheels);Oxygen Nurse Communication: Mobility status  Activity Tolerance: Patient tolerated treatment well Patient left: in bed;with call bell/phone within reach;with bed alarm set  OT Visit Diagnosis: Unsteadiness on feet (R26.81);Other abnormalities of gait and mobility (R26.89);History of falling (Z91.81);Muscle weakness (generalized) (M62.81)                Time: 3005-1102 OT Time Calculation (min): 38 min Charges:  OT General Charges $OT Visit: 1 Visit OT Evaluation $OT Eval Moderate Complexity: 1 Mod OT Treatments $Self Care/Home Management : 8-22 mins $Therapeutic Activity: 8-22 mins  Malachy Chamber, OTR/L Acute Rehab Services Office: [phone]   Layla Maw 09/06/2021, 8:36 AM

## 2021-09-06 NOTE — Significant Event (Signed)
Patient HR dropping to 40s while napping when awake HR 50s and 60s.  Offered to get up to the chair but patient declined.

## 2021-09-06 NOTE — TOC Initial Note (Signed)
Transition of Care [facility]) - Initial/Assessment Note    Patient Details  Name: Vincent Robinson MRN: [id number] Date of Birth: Jul 04, 1923  [facility]) CM/SW Contact:    Tresa Endo Phone Number: 09/06/2021, 2:17 PM  Clinical Narrative:                 CSW spoke with pt about DC plan, pt states he does not want to go to a SNF and would like [facility] set up. CSW informed MD and NCM.  Expected Discharge Plan: Skilled Nursing Facility Barriers to Discharge: Barriers Resolved   Patient Goals and CMS Choice Patient states their goals for this hospitalization and ongoing recovery are:: Home CMS Medicare.gov Compare Post Acute Care list provided to:: Patient Choice offered to / list presented to : Patient  Expected Discharge Plan and Services Expected Discharge Plan: [place] In-house Referral: Clinical Social Work   Post Acute Care Choice: NA Living arrangements for the past 2 months: Single Family Home                                      Prior Living Arrangements/Services Living arrangements for the past 2 months: Single Family Home Lives with:: Self Patient language and need for interpreter reviewed:: Yes Do you feel safe going back to the place where you live?: Yes      Need for Family Participation in Patient Care: No (Comment) Care giver support system in place?: Yes (comment)   Criminal Activity/Legal Involvement Pertinent to Current Situation/Hospitalization: No - Comment as needed  Activities of Daily Living Home Assistive Devices/Equipment: CPAP, Oxygen, Cane (specify quad or straight), Walker (specify type) ADL Screening (condition at time of admission) Patient's cognitive ability adequate to safely complete daily activities?: Yes Is the patient deaf or have difficulty hearing?: No Does the patient have difficulty seeing, even when wearing glasses/contacts?: No Does the patient have difficulty concentrating, remembering, or  making decisions?: No Patient able to express need for assistance with ADLs?: Yes Does the patient have difficulty dressing or bathing?: No Independently performs ADLs?: Yes (appropriate for developmental age) Does the patient have difficulty walking or climbing stairs?: Yes Weakness of Legs: Both Weakness of Arms/Hands: None  Permission Sought/Granted   Permission granted to share information with : No              Emotional Assessment Appearance:: Appears stated age Attitude/Demeanor/Rapport: Engaged Affect (typically observed): Accepting Orientation: : Oriented to Self, Oriented to Place, Oriented to  Time, Oriented to Situation Alcohol / Substance Use: Not Applicable Psych Involvement: No (comment)  Admission diagnosis:  Near syncope [R55] Acute on chronic respiratory failure with hypoxia (HCC) [J96.21] Patient Active Problem List   Diagnosis Date Noted   Symptomatic sinus bradycardia 09/06/2021   Near syncope 09/05/2021   Prolonged QT interval 07/09/2021   CHF exacerbation ([place]) 07/08/2021   Esophageal obstruction due to food impaction 07/08/2021   Acute on chronic diastolic CHF (congestive heart failure) ([place]) 07/08/2021   Malignant neoplasm of overlapping sites of bladder ([place]) 03/09/2021   Pneumonia due to COVID-19 virus 11/14/2020   Elevated troponin 11/14/2020   Bladder mass 11/14/2020   Acute cholecystitis 09/08/2020   Diverticular hemorrhage    Rectal bleeding 03/22/2020   Acute GI bleeding 03/22/2020   Hypokalemia    Hypotension    Acute respiratory failure with hypoxia ([place]) 12/18/2019   Acute on chronic  right-sided heart failure ([place]) 12/18/2019   Pressure injury of skin 11/24/2019   Chronic respiratory failure (HCC)    Acute on chronic respiratory failure ([place]) 11/23/2019   Right heart failure (HCC)    S/P CABG (coronary artery bypass graft) 11/22/2019   Chest pain 11/22/2019   Atrial fibrillation ([place]) 08/04/2019   Pulmonary hypertension,  unspecified ([place]) 10/11/2017   Aortic stenosis, moderate 10/11/2017   Coronary artery disease 08/30/2017   AAA (abdominal aortic aneurysm) without rupture 08/30/2017   Acute blood loss anemia 06/07/2017   Hemorrhagic shock ([place]) 06/07/2017   Lower GI bleed 06/05/2017   Sleep apnea 06/05/2017   Hypothyroidism 06/05/2017   Hyperlipidemia 06/05/2017   Asthma 06/05/2017   ILD (interstitial lung disease) ([place]) 05/28/2017   Pneumothorax on right 05/20/2017   COPD (chronic obstructive pulmonary disease) ([place]) 05/20/2017   Pneumothorax 05/20/2017   Skin cancer 10/23/2015   PCP:  Katherina Mires, MD Pharmacy:   [facility] [id number] - [place], [place] - [address] [place] [address] Phone: [phone] Fax: [phone]  [facility] [address] - [place], [place] - [address] [place] [address], [address] [place] [address] Phone: [phone] Fax: [phone]  [facility] [place] [place] [address] Phone: [phone] Fax: [phone]     Social Determinants of Health (SDOH) Interventions    Readmission Risk Interventions Readmission Risk Prevention Plan 07/11/2021 09/19/2020 03/25/2020  Transportation Screening Complete Complete Complete  PCP or Specialist Appt within 5-7 Days - - -  Home Care Screening - - -  Medication Review (RN CM) - - -  [place] or [place] - Complete Patient refused  Social Work Consult for [place] Planning/Counseling - Complete -  Palliative Care Screening - Complete Not Applicable  Medication Review Press photographer) Complete Complete Complete  PCP or Specialist appointment within 3-5 days of discharge Complete - -  [place] or Home Care Consult Complete - -  SW Recovery Care/Counseling Consult Complete - -  Palliative Care Screening Not Applicable - -  [place] Not Applicable - -  Some recent data might be hidden

## 2021-09-12 NOTE — Progress Notes (Signed)
ADVANCED HF CLINIC NOTE   PCP: Katherina Mires, MD Primary Cardiologist: Dr. Gwenlyn Found HF Cardiologist: Dr. Haroldine Laws   HPI: Vincent Robinson is 87 y.o. 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   He had coronary artery bypass grafting in 1998. He had an endoluminal stent graft for abdominal aortic aneurysm in [place] 10/09 . Pre-op cath showed patent grafts.  Doppler performed 2/18 revealed aortic dimensions of approximately 4.4 x 4.6 cm. Has been followed by Dr. Vaughan Browner.  Wears BIPAP for severe OSA. Has been on home O2 since 2017.   He was referred to me in 2/19 by Dr. Gwenlyn Found for further evaluation of moderate to severe PAH. He has tolerated combination therapy well with improvement of his symptoms.  Echo 11/19: EF 60-65% RV mild to moderately reduced. RVSP 10mmHG Mild AS.  Hospitalized in 2/21 for volume overload and hypoxia. Diuresed well. Switched to torsemide. Started on midodrine for low BP.   Admitted in 11/21 Sepsis due to acute cholecystitis with enterococcus and enterobacter. C- tube placed.  Echo 11/21 EF 60-65% RV moderately HK RVSP 85 AoV severely calcified. Moderate AS Personally reviewed  Hospitalized in 1/22 due COVID and aspiration PNA.   Admitted 9/17-9/20/22 with A/CHF and A/C hypoxemic respiratory failure. Echo showed EF 60-65%, breathing improved after several doses of IV lasix. Hospitalization c/b dysphagia, GI consulted but deferred endoscopic eval due to age and comorbidities. Swallowing difficulties resolved by discharge, d/c weight 148 lbs.  Doing well post hospital follow up 9/22, AF controlled, volume good and tolerating PAH meds.  Admitted 11/22 with symptomatic bradycardia, HR in 30's. He was hemodynamically stable and HR improved to 50's. There was discussion about EP consult for PPM but he declined this. He was continued on midodrine. PT rec SNF but he declined, discharged back to  assisted living facility, weight 147 lbs.  Today he returns for HF follow up with his daughter. He is back at [facility] after staying with daughter for a few days post hospitalization. Breathing is about the same, no further dizziness or pre-syncope. Able to get around facility OK with his rolling walker, remains somewhat weak. Overall feeling fine. Denies CP, edema, or PND/Orthopnea. Appetite ok. No fever or chills. Taking all medications. He is on 3L continuous oxygen, no issues with C-tube or abnormal drainage. Daughter concerned that he has a cough when staying at her house.   Cardiac Studies: - Echo (9/22): EF 60-65%, mod LVH, RV severely reduced, severe biatrial dilation, small pericardial effusion, mod MR, mod to severe TR  - RHC (12/11/17): RA = 6 RV = 72/7 PA = 73/27 (44) PCW = 10 Fick cardiac output/index = 4.1/2.1 PVR = 8.4 WU Ao sat = 91% PA sat = 58%, 58%   - Echo (11/18): EF 60-65% grade I DD  RV severely dilated moderate TR RVSP 40mmHG   - VQ (12/18): Normal perfusion lung scan.   Irregular peripheral ventilation in both lungs consistent with chronic parenchymal lung disease changes.  - PFTs (02/28/16): FVC 3.37 (116%), FEV1 2.08 (108%), F/F 62 Mild obstructive airway disease, no broncho-dilator response  - CT scan 07/15/15- 2 .3 x 1.7 cm right upper lobe calcified opacity likely to represent pleural , parenchymal scarring. Mild centrilobular, paraseptal emphysema. Subpleural reticulation, fibrosis representing mild chronic ILD High-resolution CT 09/04/16 - stable right upper lobe scarring, hyper aerated lungs. Minimal interstitial  lung disease and peripheral unchanged compared to 2016.  - Hi-res CT: 01/06/18: Stable ILD   Review of systems complete and found to be negative unless listed in HPI.   Past Medical History:  Diagnosis Date   AAA (abdominal aortic aneurysm)    Acute respiratory failure (HCC) 11/23/2019   Asthma    Basal cell carcinoma     CHF (congestive heart failure) (HCC)    Chronic respiratory failure (HCC)    COPD (chronic obstructive pulmonary disease) (HCC)    Coronary artery disease    Diverticulitis    Dysrhythmia    afib   History of chronic respiratory failure    Hyperlipidemia    Hypothyroidism    Moderate aortic stenosis    Myocardial infarction ([place])    Pneumonia    Pulmonary HTN (HCC)    Right heart failure (HCC)    Skin cancer 2017   bladder    Sleep apnea    Bipap   Current Outpatient Medications  Medication Sig Dispense Refill   albuterol (VENTOLIN HFA) 108 (90 Base) MCG/ACT inhaler Inhale 1 puff into the lungs every 6 (six) hours as needed for wheezing or shortness of breath.     ferrous sulfate 325 (65 FE) MG tablet Take 325 mg by mouth daily with breakfast.     levothyroxine (SYNTHROID) 88 MCG tablet Take 88 mcg by mouth daily before breakfast.     macitentan (OPSUMIT) 10 MG tablet Take 1 tablet (10 mg total) by mouth daily. Needs appt for future refills 30 tablet 3   midodrine (PROAMATINE) 10 MG tablet Take 1 tablet (10 mg total) by mouth 3 (three) times daily after meals. In addition with the 5mg  tablets (Patient taking differently: Take 10 mg by mouth 3 (three) times daily. Take with a 5 mg tablet for a total dose of 15 mg) 270 tablet 3   midodrine (PROAMATINE) 5 MG tablet Take 1 tablet (5 mg total) by mouth 3 (three) times daily with meals. In addition with the 10mg  tablets (Patient taking differently: Take 5 mg by mouth 3 (three) times daily. Take with a 10 mg tablet for a total dose of 15 mg) 270 tablet 3   montelukast (SINGULAIR) 10 MG tablet Take 10 mg by mouth at bedtime.     pantoprazole (PROTONIX) 20 MG tablet Take 20 mg by mouth every morning.     Potassium Chloride (KLOR-CON 10 PO) Take 10 mg by mouth 2 (two) times daily.     sildenafil (REVATIO) 20 MG tablet TAKE TWO TABLETS BY MOUTH THREE TIMES A DAY (Patient taking differently: Take 40 mg by mouth 3 (three) times daily. For  pulmonary hypertension) 540 tablet 3   simvastatin (ZOCOR) 10 MG tablet Take 10 mg by mouth at bedtime.     Sodium Chloride Flush (NORMAL SALINE FLUSH) 0.9 % SOLN USE 5 MLS 2 TIMES DAILY AS DIRECTED (Patient taking differently: 5 mLs See admin instructions. Flush gall bladder drain with 5 mls twice weekly) 600 mL 1   torsemide (DEMADEX) 20 MG tablet TAKE TWO TABLETS BY MOUTH DAILY (Patient taking differently: Take 40 mg by mouth every morning.) 60 tablet 11   No current facility-administered medications for this encounter.   Facility-Administered Medications Ordered in Other Encounters  Medication Dose Route Frequency Provider Last Rate Last Admin   gemcitabine (GEMZAR) chemo syringe for bladder instillation 2,000 mg  2,000 mg Bladder Instillation Once Franchot Gallo, MD       Allergies  Allergen Reactions  Oxycodone     Other reaction(s): Delusions (intolerance), Hallucinations Wiped him out Wiped him out    Hydrocodone Other (See Comments)   Social History   Socioeconomic History   Marital status: Widowed    Spouse name: Not on file   Number of children: Not on file   Years of education: Not on file   Highest education level: Not on file  Occupational History   Not on file  Tobacco Use   Smoking status: Former    Packs/day: 0.50    Types: Cigarettes    Quit date: 08/06/1961    Years since quitting: 60.1   Smokeless tobacco: Never  Vaping Use   Vaping Use: Never used  Substance and Sexual Activity   Alcohol use: Not Currently   Drug use: Never   Sexual activity: Not Currently  Other Topics Concern   Not on file  Social History Narrative   Not on file   Social Determinants of Health   Financial Resource Strain: Not on file  Food Insecurity: Not on file  Transportation Needs: Not on file  Physical Activity: Not on file  Stress: Not on file  Social Connections: Not on file  Intimate Partner Violence: Not on file   Family History  Problem Relation Age of  Onset   Emphysema Mother    Stroke Father    Movement disorder Sister    Aneurysm Brother        Thoracic aorta   Macular degeneration Brother    BP 104/60   Pulse 86   Wt 69.2 kg   SpO2 95%   BMI 23.90 kg/m   Wt Readings from Last 3 Encounters:  09/18/21 69.2 kg  09/06/21 67.1 kg  07/18/21 69.4 kg   PHYSICAL EXAM: General:  NAD. No resp difficulty, elderly on oxygen, walked into clinic with RW HEENT: HOH, bilateral hearing aids in Neck: Supple. No JVD. Carotids 2+ bilat; no bruits. No lymphadenopathy or thryomegaly appreciated. Cor: PMI nondisplaced. Irregular rate & rhythm. No rubs, gallops, 2/6 AS, audible S2 Lungs: Diminished in all lung fields Abdomen: Soft, nontender, nondistended. No hepatosplenomegaly. No bruits or masses. Good bowel sounds. Extremities: No cyanosis, clubbing, rash, trace pedal edema, compression hose on Neuro: Alert & oriented x 3, cranial nerves grossly intact. Moves all 4 extremities w/o difficulty. Affect pleasant.  ECG (personally reviewed): atrial fibrillation 75 bpm  ASSESSMENT & PLAN:  1. Pulmonary hypertension with cor pulmonale - Echo, CT, PFTs and VQ studies reviewed personally - He has evidence of significant pulmonary HTN with severe RV failure. Etiology remains unclear.  - CT with evidence of ILD but PFTs not that bad. VQ negative.  - PH multifactorial but out of proportion to HF & ILD so felt to have component of Group I disease. - RHC 2/19 c/w moderate PAH - NYHA III Continue sildenafil 40 mg tid + macitentan 10 daily. No plans to add selexipag at this point - Volume status stable on torsemide 40 mg daily. - Continue midodrine 15 mg tid. - Continue compression stockings. - BMET today.  2. Near syncope/symptomatic bradycardia - Sounds like orthostasis, but did have documented HR in 30's in ED during last admit. - I think it is reasonable to place Zio 14 day to quantify, despite likely not a PPM candidate. Can better assist with  future living arrangements if he is having significant bradycardia/pauses and requires more care. - Continue compression hose + midodrine + BiPap. - Discussed fall precautions. - Continue PT at facility. -  Labs today.  3. Chronic respiratory failure - Evidence of mild ILD on CT. PFTs not too bad. - Has severe OSA on Bipap. Continue BiPAP qHS - he uses it religiously - Continue supplemental O2. Sats look good today. - Follows with Pulmonary.  4. CAD s/p CABG - No s/s angina Myoview 1/19 normal.  - Managed by Dr. Gwenlyn Found.  - No chest pain. - On statin.  5. Aortic stenosis - Moderate by echo 2/21 mean gradient 22.  - Will follow.   6. Atrial fibrillation, chronic - Rate controlled on ECG today. - Not candidate for [facility] due to recurrent LGIB.  7. Cholecystitis - Continue C-tube with regular tube changes. - He is not candidate for cholecystectomy due to very high surgical risk.  Follow up in 2 months with Dr. Haroldine Laws as scheduled.  Vincent Bihari, FNP  12:19 PM

## 2021-09-13 NOTE — ED Provider Notes (Signed)
[facility] [facility] PCU Provider Note   CSN: [id number] Arrival date & time: 09/04/21  2209     History Chief Complaint  Patient presents with   Near Syncope    Vincent Robinson is a 87 y.o. male here with shortness of breath, near syncope today while in the bathroom.  Hx of CHF and COPD, on oxygen at home.  Here with daughter.  HPI     Past Medical History:  Diagnosis Date   AAA (abdominal aortic aneurysm)    Acute respiratory failure (HCC) 11/23/2019   Asthma    Basal cell carcinoma    CHF (congestive heart failure) (HCC)    Chronic respiratory failure (HCC)    COPD (chronic obstructive pulmonary disease) (HCC)    Coronary artery disease    Diverticulitis    Dysrhythmia    afib   History of chronic respiratory failure    Hyperlipidemia    Hypothyroidism    Moderate aortic stenosis    Myocardial infarction ([place])    Pneumonia    Pulmonary HTN ([place])    Right heart failure ([place])    Skin cancer 2017   bladder    Sleep apnea    Bipap    Patient Active Problem List   Diagnosis Date Noted   Symptomatic sinus bradycardia 09/06/2021   Near syncope 09/05/2021   Prolonged QT interval 07/09/2021   CHF exacerbation ([place]) 07/08/2021   Esophageal obstruction due to food impaction 07/08/2021   Acute on chronic diastolic CHF (congestive heart failure) ([place]) 07/08/2021   Malignant neoplasm of overlapping sites of bladder ([place]) 03/09/2021   Pneumonia due to COVID-19 virus 11/14/2020   Elevated troponin 11/14/2020   Bladder mass 11/14/2020   Acute cholecystitis 09/08/2020   Diverticular hemorrhage    Rectal bleeding 03/22/2020   Acute GI bleeding 03/22/2020   Hypokalemia    Hypotension    Acute respiratory failure with hypoxia ([place]) 12/18/2019   Acute on chronic right-sided heart failure ([place]) 12/18/2019   Pressure injury of skin 11/24/2019   Chronic respiratory failure ([place])    Acute on chronic respiratory failure ([place]) 11/23/2019   Right heart failure (HCC)     S/P CABG (coronary artery bypass graft) 11/22/2019   Chest pain 11/22/2019   Atrial fibrillation ([place]) 08/04/2019   Pulmonary hypertension, unspecified ([place]) 10/11/2017   Aortic stenosis, moderate 10/11/2017   Coronary artery disease 08/30/2017   AAA (abdominal aortic aneurysm) without rupture 08/30/2017   Acute blood loss anemia 06/07/2017   Hemorrhagic shock ([place]) 06/07/2017   Lower GI bleed 06/05/2017   Sleep apnea 06/05/2017   Hypothyroidism 06/05/2017   Hyperlipidemia 06/05/2017   Asthma 06/05/2017   ILD (interstitial lung disease) ([place]) 05/28/2017   Pneumothorax on right 05/20/2017   COPD (chronic obstructive pulmonary disease) ([place]) 05/20/2017   Pneumothorax 05/20/2017   Skin cancer 10/23/2015    Past Surgical History:  Procedure Laterality Date   bleeding ulcer  2016   BYPASS AXILLA/BRACHIAL ARTERY  1998   colon bleed  2012   COLONOSCOPY N/A 06/07/2017   Procedure: COLONOSCOPY;  Surgeon: Carol Ada, MD;  Location: [facility];  Service: Endoscopy;  Laterality: N/A;   CORONARY ARTERY BYPASS GRAFT     5 vessel   DEBRIDMENT OF DECUBITUS ULCER  2002   EYE SURGERY     cataract   IR ANGIOGRAM SELECTIVE EACH ADDITIONAL VESSEL  03/22/2020   IR ANGIOGRAM VISCERAL SELECTIVE  03/22/2020   IR EXCHANGE BILIARY DRAIN  12/02/2020  IR EXCHANGE BILIARY DRAIN  02/08/2021   IR EXCHANGE BILIARY DRAIN  04/05/2021   IR EXCHANGE BILIARY DRAIN  06/05/2021   IR EXCHANGE BILIARY DRAIN  07/31/2021   IR PERC CHOLECYSTOSTOMY  09/08/2020   IR RADIOLOGIST EVAL & MGMT  10/06/2020   IR RADIOLOGIST EVAL & MGMT  10/26/2020   IR US GUIDE VASC ACCESS RIGHT  03/22/2020   RIGHT HEART CATH N/A 12/11/2017   Procedure: RIGHT HEART CATH;  Surgeon: Jolaine Artist, MD;  Location: [facility];  Service: Cardiovascular;  Laterality: N/A;   THORACIC AORTA STENT  2009   TRANSURETHRAL RESECTION OF BLADDER TUMOR WITH MITOMYCIN-C N/A 03/09/2021   Procedure: TRANSURETHRAL RESECTION OF BLADDER TUMOR;   Surgeon: Franchot Gallo, MD;  Location: [facility];  Service: Urology;  Laterality: N/A;       Family History  Problem Relation Age of Onset   Emphysema Mother    Stroke Father    Movement disorder Sister    Aneurysm Brother        Thoracic aorta   Macular degeneration Brother     Social History   Tobacco Use   Smoking status: Former    Packs/day: 0.50    Types: Cigarettes    Quit date: 08/06/1961    Years since quitting: 60.1   Smokeless tobacco: Never  Vaping Use   Vaping Use: Never used  Substance Use Topics   Alcohol use: Not Currently   Drug use: Never    Home Medications Prior to Admission medications   Medication Sig Start Date End Date Taking? Authorizing Provider  albuterol (VENTOLIN HFA) 108 (90 Base) MCG/ACT inhaler Inhale 1 puff into the lungs every 6 (six) hours as needed for wheezing or shortness of breath.   Yes [provider]  ferrous sulfate 325 (65 FE) MG tablet Take 325 mg by mouth daily with breakfast.   Yes [provider]  levothyroxine (SYNTHROID) 88 MCG tablet Take 88 mcg by mouth daily before breakfast.   Yes [provider]  macitentan (OPSUMIT) 10 MG tablet Take 1 tablet (10 mg total) by mouth daily. Needs appt for future refills 08/19/20  Yes Bensimhon, Shaune Pascal, MD  midodrine (PROAMATINE) 10 MG tablet Take 1 tablet (10 mg total) by mouth 3 (three) times daily after meals. In addition with the 5mg  tablets Patient taking differently: Take 10 mg by mouth 3 (three) times daily. Take with a 5 mg tablet for a total dose of 15 mg 12/29/20  Yes Bensimhon, Shaune Pascal, MD  midodrine (PROAMATINE) 5 MG tablet Take 1 tablet (5 mg total) by mouth 3 (three) times daily with meals. In addition with the 10mg  tablets Patient taking differently: Take 5 mg by mouth 3 (three) times daily. Take with a 10 mg tablet for a total dose of 15 mg 12/29/20  Yes Bensimhon, Shaune Pascal, MD  montelukast (SINGULAIR) 10 MG tablet Take 10 mg by mouth at  bedtime.   Yes [provider]  pantoprazole (PROTONIX) 20 MG tablet Take 20 mg by mouth every morning.   Yes [provider]  potassium chloride SA (KLOR-CON) 10 MEQ tablet Take 2 tablets (20 mEq total) by mouth in the morning and at bedtime. 07/18/21  Yes Bensimhon, Shaune Pascal, MD  sildenafil (REVATIO) 20 MG tablet TAKE TWO TABLETS BY MOUTH THREE TIMES A DAY Patient taking differently: Take 40 mg by mouth 3 (three) times daily. For pulmonary hypertension 11/02/20  Yes Lyda Jester M, PA-C  simvastatin (ZOCOR) 10  MG tablet Take 10 mg by mouth at bedtime.   Yes [provider]  Sodium Chloride Flush (NORMAL SALINE FLUSH) 0.9 % SOLN USE 5 MLS 2 TIMES DAILY AS DIRECTED Patient taking differently: 5 mLs See admin instructions. Flush gall bladder drain with 5 mls twice weekly 09/22/20 09/22/21 Yes Clovis Riley, MD  torsemide (DEMADEX) 20 MG tablet TAKE TWO TABLETS BY MOUTH DAILY Patient taking differently: Take 40 mg by mouth every morning. 01/11/21  Yes Bensimhon, Shaune Pascal, MD  Vitamin D, Ergocalciferol, (DRISDOL) 1.25 MG (50000 UNIT) CAPS capsule Take 50,000 Units by mouth every Sunday. Patient not taking: No sig reported 05/26/21   [provider]    Allergies    Oxycodone and Hydrocodone  Review of Systems   Review of Systems  Constitutional:  Negative for chills and fever.  HENT:  Negative for ear pain and sore throat.   Eyes:  Negative for pain and visual disturbance.  Respiratory:  Positive for shortness of breath. Negative for cough.   Cardiovascular:  Negative for chest pain and palpitations.  Gastrointestinal:  Negative for abdominal pain and vomiting.  Genitourinary:  Negative for dysuria and hematuria.  Musculoskeletal:  Negative for arthralgias and back pain.  Skin:  Negative for color change and rash.  Neurological:  Positive for light-headedness. Negative for speech difficulty.  All other systems reviewed and are negative.  Physical  Exam Updated Vital Signs BP (!) 98/55 (BP Location: Left Arm)   Pulse (!) 51   Temp (!) 97.5 F (36.4 C) (Oral)   Resp 18   Ht 5\' 7"  (1.702 m)   Wt 67.1 kg   SpO2 (!) 88%   BMI 23.17 kg/m   Physical Exam Constitutional:      General: He is not in acute distress. HENT:     Head: Normocephalic and atraumatic.  Eyes:     Conjunctiva/sclera: Conjunctivae normal.     Pupils: Pupils are equal, round, and reactive to light.  Cardiovascular:     Rate and Rhythm: Normal rate and regular rhythm.  Pulmonary:     Effort: Pulmonary effort is normal.     Comments: On 6L [place] (baseline 3-4 L) with 88-90% O2 saturation RR 30 Crackles bilaterally Abdominal:     General: There is no distension.     Tenderness: There is no abdominal tenderness.  Skin:    General: Skin is warm and dry.  Neurological:     General: No focal deficit present.     Mental Status: He is alert. Mental status is at baseline.  Psychiatric:        Mood and Affect: Mood normal.        Behavior: Behavior normal.    ED Results / Procedures / Treatments   Labs (all labs ordered are listed, but only abnormal results are displayed) Labs Reviewed  BASIC METABOLIC PANEL - Abnormal; Notable for the following components:      Result Value   Glucose, Bld 131 (*)    Calcium 8.2 (*)    GFR, Estimated 56 (*)    All other components within normal limits  CBC WITH DIFFERENTIAL/PLATELET - Abnormal; Notable for the following components:   RBC 4.20 (*)    Hemoglobin 12.7 (*)    HCT 38.6 (*)    RDW 19.4 (*)    All other components within normal limits  BRAIN NATRIURETIC PEPTIDE - Abnormal; Notable for the following components:   B Natriuretic Peptide 410.0 (*)    All other  components within normal limits  URINALYSIS, ROUTINE W REFLEX MICROSCOPIC - Abnormal; Notable for the following components:   APPearance HAZY (*)    Leukocytes,Ua LARGE (*)    WBC, UA >50 (*)    Bacteria, UA FEW (*)    All other components within  normal limits  BASIC METABOLIC PANEL - Abnormal; Notable for the following components:   Calcium 8.2 (*)    All other components within normal limits  CBC WITH DIFFERENTIAL/PLATELET - Abnormal; Notable for the following components:   RBC 4.02 (*)    Hemoglobin 12.0 (*)    HCT 37.2 (*)    RDW 19.3 (*)    All other components within normal limits  HEPATIC FUNCTION PANEL - Abnormal; Notable for the following components:   Albumin 3.1 (*)    AST 54 (*)    Total Bilirubin 1.3 (*)    Bilirubin, Direct 0.5 (*)    All other components within normal limits  RESP PANEL BY RT-PCR (FLU A&B, COVID) ARPGX2  MAGNESIUM  TSH  LACTIC ACID, PLASMA  LACTIC ACID, PLASMA  TROPONIN I (HIGH SENSITIVITY)  TROPONIN I (HIGH SENSITIVITY)    EKG EKG Interpretation  Date/Time:  Monday September 04 2021 22:20:09 EST Ventricular Rate:  49 PR Interval:  218 QRS Duration: 134 QT Interval:  532 QTC Calculation: 481 R Axis:   81 Text Interpretation: Sinus bradycardia Atrial premature complex Right bundle branch block Confirmed by Gerlene Fee [phone]) on 09/04/2021 11:05:12 PM  Radiology No results found.  Procedures Procedures   Medications Ordered in ED Medications  furosemide (LASIX) injection 20 mg (20 mg Intravenous Given 09/05/21 0112)    ED Course  I have reviewed the triage vital signs and the nursing notes.  Pertinent labs & imaging results that were available during my care of the patient were reviewed by me and considered in my medical decision making (see chart for details).  Shortness of breath - suspect CHF and/or COPD exacerbation Dyspnea for several days Stable on 6L [place] Labs pending - patient signed out to Dr Sedonia Small EDP Odette Horns pending  Daughter present at bedside for supplemental history     Final Clinical Impression(s) / ED Diagnoses Final diagnoses:  Near syncope  Acute on chronic respiratory failure with hypoxia [facility])    Rx / DC Orders ED Discharge Orders     None         Wyvonnia Dusky, MD 09/13/21 1131

## 2021-09-18 ENCOUNTER — Other Ambulatory Visit (HOSPITAL_COMMUNITY): Payer: Self-pay | Admitting: Cardiology

## 2021-09-18 ENCOUNTER — Ambulatory Visit (HOSPITAL_COMMUNITY)
Admission: RE | Admit: 2021-09-18 | Discharge: 2021-09-18 | Disposition: A | Discharge status: Home / Self Care | Payer: Medicare Other | Source: Ambulatory Visit | Attending: Cardiology | Admitting: Cardiology

## 2021-09-18 ENCOUNTER — Encounter (HOSPITAL_COMMUNITY): Payer: Self-pay

## 2021-09-18 ENCOUNTER — Other Ambulatory Visit (HOSPITAL_COMMUNITY): Payer: Self-pay

## 2021-09-18 VITALS — BP 104/60 | HR 86 | Wt 152.6 lb

## 2021-09-18 DIAGNOSIS — I2781 Cor pulmonale (chronic): Secondary | ICD-10-CM | POA: Insufficient documentation

## 2021-09-18 DIAGNOSIS — J432 Centrilobular emphysema: Secondary | ICD-10-CM | POA: Diagnosis not present

## 2021-09-18 DIAGNOSIS — I50812 Chronic right heart failure: Secondary | ICD-10-CM | POA: Insufficient documentation

## 2021-09-18 DIAGNOSIS — I482 Chronic atrial fibrillation, unspecified: Secondary | ICD-10-CM | POA: Diagnosis not present

## 2021-09-18 DIAGNOSIS — I272 Pulmonary hypertension, unspecified: Secondary | ICD-10-CM

## 2021-09-18 DIAGNOSIS — R001 Bradycardia, unspecified: Secondary | ICD-10-CM | POA: Insufficient documentation

## 2021-09-18 DIAGNOSIS — Z9981 Dependence on supplemental oxygen: Secondary | ICD-10-CM | POA: Insufficient documentation

## 2021-09-18 DIAGNOSIS — J9610 Chronic respiratory failure, unspecified whether with hypoxia or hypercapnia: Secondary | ICD-10-CM | POA: Insufficient documentation

## 2021-09-18 DIAGNOSIS — R55 Syncope and collapse: Secondary | ICD-10-CM | POA: Insufficient documentation

## 2021-09-18 DIAGNOSIS — Z09 Encounter for follow-up examination after completed treatment for conditions other than malignant neoplasm: Secondary | ICD-10-CM | POA: Diagnosis not present

## 2021-09-18 DIAGNOSIS — R531 Weakness: Secondary | ICD-10-CM | POA: Insufficient documentation

## 2021-09-18 DIAGNOSIS — G4733 Obstructive sleep apnea (adult) (pediatric): Secondary | ICD-10-CM | POA: Diagnosis not present

## 2021-09-18 DIAGNOSIS — I2510 Atherosclerotic heart disease of native coronary artery without angina pectoris: Secondary | ICD-10-CM | POA: Diagnosis not present

## 2021-09-18 DIAGNOSIS — J849 Interstitial pulmonary disease, unspecified: Secondary | ICD-10-CM | POA: Insufficient documentation

## 2021-09-18 DIAGNOSIS — I2729 Other secondary pulmonary hypertension: Secondary | ICD-10-CM | POA: Insufficient documentation

## 2021-09-18 DIAGNOSIS — J9611 Chronic respiratory failure with hypoxia: Secondary | ICD-10-CM

## 2021-09-18 DIAGNOSIS — K819 Cholecystitis, unspecified: Secondary | ICD-10-CM | POA: Insufficient documentation

## 2021-09-18 DIAGNOSIS — E782 Mixed hyperlipidemia: Secondary | ICD-10-CM

## 2021-09-18 DIAGNOSIS — I4821 Permanent atrial fibrillation: Secondary | ICD-10-CM

## 2021-09-18 DIAGNOSIS — I350 Nonrheumatic aortic (valve) stenosis: Secondary | ICD-10-CM

## 2021-09-18 DIAGNOSIS — Z87891 Personal history of nicotine dependence: Secondary | ICD-10-CM | POA: Diagnosis not present

## 2021-09-18 DIAGNOSIS — Z951 Presence of aortocoronary bypass graft: Secondary | ICD-10-CM | POA: Insufficient documentation

## 2021-09-18 LAB — BASIC METABOLIC PANEL
Anion gap: 7 (ref 5–15)
BUN: 16 mg/dL (ref 8–23)
CO2: 25 mmol/L (ref 22–32)
Calcium: 8.5 mg/dL — ABNORMAL LOW (ref 8.9–10.3)
Chloride: 101 mmol/L (ref 98–111)
Creatinine, Ser: 1.11 mg/dL (ref 0.61–1.24)
GFR, Estimated: >60 mL/min (ref >60)
Glucose, Bld: 129 mg/dL — ABNORMAL HIGH (ref 70–99)
Potassium: 3.6 mmol/L (ref 3.5–5.1)
Sodium: 133 mmol/L — ABNORMAL LOW (ref 135–145)

## 2021-09-18 NOTE — Patient Instructions (Signed)
Thank You for coming in today  Labs were done today, If any labs are abnormal the clinic will call you   Zio patch was placed on you today, you wee given the information needed  Your physician recommends that you schedule a follow-up appointment in: please keep follow up with Dr. Haroldine Laws  At the [facility], you and your health needs are our priority. As part of our continuing mission to provide you with exceptional heart care, we have created designated Provider Care Teams. These Care Teams include your primary Cardiologist (physician) and Advanced Practice Providers (APPs- Physician Assistants and Nurse Practitioners) who all work together to provide you with the care you need, when you need it.   You may see any of the following providers on your designated Care Team at your next follow up: Dr Glori Bickers Dr Haynes Kerns, NP Lyda Jester, [place] [facility] [place], [place] Audry Riles, PharmD   Please be sure to bring in all your medications bottles to every appointment.   If you have any questions or concerns before your next appointment please send [place] a message through [place] or call our office at [phone].    TO LEAVE A MESSAGE FOR THE NURSE SELECT OPTION 2, PLEASE LEAVE A MESSAGE INCLUDING: YOUR NAME DATE OF BIRTH CALL BACK NUMBER REASON FOR CALL**this is important as we prioritize the call backs  YOU WILL RECEIVE A CALL BACK THE SAME DAY AS LONG AS YOU CALL BEFORE 4:00 PM

## 2021-09-19 ENCOUNTER — Encounter (HOSPITAL_COMMUNITY): Payer: Self-pay

## 2021-09-25 ENCOUNTER — Other Ambulatory Visit (HOSPITAL_COMMUNITY): Payer: Self-pay | Admitting: Interventional Radiology

## 2021-09-25 ENCOUNTER — Other Ambulatory Visit: Payer: Self-pay

## 2021-09-25 ENCOUNTER — Ambulatory Visit (HOSPITAL_COMMUNITY)
Admission: RE | Admit: 2021-09-25 | Discharge: 2021-09-25 | Disposition: A | Discharge status: Home / Self Care | Payer: Medicare Other | Source: Ambulatory Visit | Attending: Interventional Radiology | Admitting: Interventional Radiology

## 2021-09-25 DIAGNOSIS — K819 Cholecystitis, unspecified: Secondary | ICD-10-CM

## 2021-09-25 DIAGNOSIS — Z434 Encounter for attention to other artificial openings of digestive tract: Secondary | ICD-10-CM | POA: Insufficient documentation

## 2021-09-25 HISTORY — PX: IR EXCHANGE BILIARY DRAIN: IMG6046

## 2021-09-25 MED ORDER — LIDOCAINE HCL 1 % IJ SOLN
INTRAMUSCULAR | Status: AC
  Administered 2021-09-25: 5 mL via SUBCUTANEOUS
  Filled 2021-09-25: qty 20

## 2021-09-25 NOTE — Procedures (Signed)
Interventional Radiology Procedure Note  Procedure: Image guided exchange of perc chole.  61F drain to gravity  Complications: None  Recommendations: - Routine drain care, with sterile flushes, record output - routine wound care  Signed,  Dulcy Fanny. Earleen Newport, DO

## 2021-09-27 ENCOUNTER — Telehealth (HOSPITAL_COMMUNITY): Payer: Self-pay | Admitting: Pharmacy Technician

## 2021-09-27 ENCOUNTER — Other Ambulatory Visit (HOSPITAL_COMMUNITY): Payer: Self-pay

## 2021-09-27 NOTE — Telephone Encounter (Signed)
Advanced Heart Failure Patient Advocate Encounter  Sent docusign link to patient and his daughter for O[employer] assistance renewal. Of note, patient does not have part D coverage.

## 2021-09-28 ENCOUNTER — Telehealth (HOSPITAL_COMMUNITY): Payer: Self-pay | Admitting: *Deleted

## 2021-09-28 ENCOUNTER — Other Ambulatory Visit (HOSPITAL_COMMUNITY): Payer: Self-pay

## 2021-09-28 MED ORDER — OPSUMIT 10 MG PO TABS: 10.0000 mg | ORAL_TABLET | Freq: Every day | ORAL | 3 refills | Status: DC

## 2021-09-28 NOTE — Telephone Encounter (Signed)
Pts home health RN called to report pt has 3-4+ pitting edema in both legs and feet. Pts weight is only up 2lbs x 2 days.  RN asked that we call back with advice.  TGAID,KS [phone]  Routed to [facility] for advice

## 2021-09-29 MED ORDER — TORSEMIDE 20 MG PO TABS: 60.0000 mg | ORAL_TABLET | Freq: Every day | ORAL | 11 refills | Status: DC

## 2021-09-29 NOTE — Telephone Encounter (Signed)
RN aware

## 2021-09-29 NOTE — Telephone Encounter (Signed)
Spoke with RN Missy and faxed order for UNNA boots to [phone].  She reported pts weight is now up 4lbs x 3days and asked if we wanted to increase any medications.

## 2021-10-02 ENCOUNTER — Encounter (HOSPITAL_COMMUNITY): Payer: Self-pay

## 2021-10-02 ENCOUNTER — Other Ambulatory Visit (HOSPITAL_COMMUNITY): Payer: Self-pay | Admitting: Interventional Radiology

## 2021-10-02 ENCOUNTER — Ambulatory Visit (HOSPITAL_COMMUNITY)
Admission: RE | Admit: 2021-10-02 | Discharge: 2021-10-02 | Disposition: A | Discharge status: Home / Self Care | Payer: Medicare Other | Source: Ambulatory Visit | Attending: Interventional Radiology | Admitting: Interventional Radiology

## 2021-10-02 ENCOUNTER — Other Ambulatory Visit: Payer: Self-pay

## 2021-10-02 DIAGNOSIS — K819 Cholecystitis, unspecified: Secondary | ICD-10-CM

## 2021-10-03 ENCOUNTER — Other Ambulatory Visit (HOSPITAL_COMMUNITY): Payer: Self-pay | Admitting: *Deleted

## 2021-10-03 MED ORDER — OPSUMIT 10 MG PO TABS: 10.0000 mg | ORAL_TABLET | Freq: Every day | ORAL | 3 refills | Status: DC

## 2021-10-05 NOTE — Telephone Encounter (Signed)
Received fax from [place], [place] w/[employer], pt refused to wear unna boots, Janett Billow, NP is aware

## 2021-10-06 ENCOUNTER — Other Ambulatory Visit (HOSPITAL_COMMUNITY): Payer: Self-pay

## 2021-10-10 ENCOUNTER — Telehealth (HOSPITAL_COMMUNITY): Payer: Self-pay | Admitting: *Deleted

## 2021-10-10 NOTE — Telephone Encounter (Signed)
A rep from [employer] called to report complete heart block at 37bpm on 09/21/21 at 4:35am  Routed to [facility]

## 2021-10-23 NOTE — Progress Notes (Signed)
ADVANCED HF CLINIC NOTE   PCP: Katherina Mires, MD Primary Cardiologist: Dr. Gwenlyn Found HF Cardiologist: Dr. Haroldine Laws   HPI: Mr. Lakeman is 88 y.o. 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   He had CABG in 1998. He had an endoluminal stent graft for abdominal aortic aneurysm in [place] 10/09 . Pre-op cath showed patent grafts.  Doppler performed 2/18 revealed aortic dimensions of approximately 4.4 x 4.6 cm.Wears BIPAP for severe OSA. Has been on home O2 since 2017.   He was referred to me in 2/19 by Dr. Gwenlyn Found for further evaluation of moderate to severe PAH. He has tolerated combination therapy well with improvement of his symptoms.  Echo 11/19: EF 60-65% RV mild to moderately reduced. RVSP 9mmHG Mild AS.  Hospitalized in 2/21 for volume overload and hypoxia. Diuresed well. Switched to torsemide. Started on midodrine for low BP.   Admitted in 11/21 Sepsis due to acute cholecystitis with enterococcus and enterobacter. C- tube placed.  Echo 11/21 EF 60-65% RV moderately HK RVSP 85 AoV severely calcified. Moderate AS Personally reviewed  Hospitalized in 1/22 due COVID and aspiration PNA.   Admitted 9/22 with A/CHF and A/C hypoxemic respiratory failure. Echo showed EF 60-65%, breathing improved after several doses of IV lasix. Hospitalization c/b dysphagia, GI consulted but deferred endoscopic eval due to age and comorbidities. Swallowing difficulties resolved by discharge, d/c weight 148 lbs.  Admitted 11/22 with symptomatic bradycardia, HR in 30's. He was hemodynamically stable and HR improved to 50's. There was discussion about EP consult for PPM but he declined this. He was continued on midodrine. PT rec SNF but he declined, discharged back to assisted living facility, weight 147 lbs.  Zio 12/22 Predominant SR Rates 37-197 (avg 73). 3 short runs NSVT. ? Two brief episodes of HR in 30s due to CHB vs  Wenckebach (hard to discern p waves)  Today he returns for HF follow up with his daughter. He is back at [facility]. Feels good. Denies any SOB, orthopnea or PND. No syncope or presyncope. Says his legs looked good until yesterday when they began to swell up.   Cardiac Studies: - Echo (9/22): EF 60-65%, mod LVH, RV severely reduced, severe biatrial dilation, small pericardial effusion, mod MR, mod to severe TR  - RHC (12/11/17): RA = 6 RV = 72/7 PA = 73/27 (44) PCW = 10 Fick cardiac output/index = 4.1/2.1 PVR = 8.4 WU Ao sat = 91% PA sat = 58%, 58%   - Echo (11/18): EF 60-65% grade I DD  RV severely dilated moderate TR RVSP 57mmHG   - VQ (12/18): Normal perfusion lung scan.   Irregular peripheral ventilation in both lungs consistent with chronic parenchymal lung disease changes.  - PFTs (02/28/16): FVC 3.37 (116%), FEV1 2.08 (108%), F/F 62 Mild obstructive airway disease, no broncho-dilator response  - CT scan 07/15/15- 2 .3 x 1.7 cm right upper lobe calcified opacity likely to represent pleural , parenchymal scarring. Mild centrilobular, paraseptal emphysema. Subpleural reticulation, fibrosis representing mild chronic ILD High-resolution CT 09/04/16 - stable right upper lobe scarring, hyper aerated lungs. Minimal interstitial lung disease and peripheral unchanged compared to 2016.  - Hi-res CT: 01/06/18: Stable ILD   Review of systems complete and found to be negative unless listed in HPI.   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     ferrous sulfate 325 (65 FE) MG tablet Take 325 mg by mouth daily with breakfast.     levothyroxine (SYNTHROID) 88 MCG tablet Take 88 mcg by mouth daily before breakfast.     macitentan (OPSUMIT) 10 MG tablet Take 1 tablet (10 mg total) by mouth daily. 30 tablet 3   midodrine (PROAMATINE) 10 MG tablet Take 1 tablet (10 mg total) by mouth 3 (three) times daily after meals. In addition with the 5mg  tablets 270 tablet 3   midodrine (PROAMATINE) 5 MG tablet Take 1 tablet (5 mg total) by mouth 3 (three) times daily with meals. In addition with the 10mg  tablets 270 tablet 3   montelukast (SINGULAIR) 10 MG tablet Take 10 mg by mouth at bedtime.     pantoprazole (PROTONIX) 20 MG tablet Take 20 mg by mouth every morning.     potassium chloride (KLOR-CON) 10 MEQ tablet Take 20 mEq by mouth daily.     sildenafil (REVATIO) 20 MG tablet TAKE TWO TABLETS BY MOUTH THREE TIMES A DAY 540 tablet 3   simvastatin (ZOCOR) 10 MG tablet Take 10 mg by mouth at bedtime.     torsemide (DEMADEX) 20 MG tablet Take 3 tablets (60 mg total) by mouth daily. 60 tablet 11   No current facility-administered medications for this encounter.   Facility-Administered Medications Ordered in Other Encounters  Medication Dose Route Frequency Provider Last Rate Last Admin   gemcitabine (GEMZAR) chemo syringe for bladder instillation 2,000 mg  2,000 mg Bladder Instillation Once Franchot Gallo, MD       Allergies  Allergen Reactions   Oxycodone     Other reaction(s): Delusions (intolerance), Hallucinations Wiped him out Wiped him out    Hydrocodone Other (See  Comments)   Social History   Socioeconomic History   Marital status: Widowed    Spouse name: Not on file   Number of children: Not on file   Years of education: Not on file   Highest education level: Not on file  Occupational History   Not on file  Tobacco Use   Smoking status: Former    Packs/day: 0.50    Types: Cigarettes    Quit date: 08/06/1961    Years since quitting: 60.2   Smokeless tobacco: Never  Vaping Use   Vaping Use: Never used  Substance and Sexual Activity   Alcohol use: Not Currently   Drug use: Never   Sexual activity: Not Currently  Other Topics Concern   Not on file  Social History Narrative   Not on file   Social  Determinants of Health   Financial Resource Strain: Not on file  Food Insecurity: Not on file  Transportation Needs: Not on file  Physical Activity: Not on file  Stress: Not on file  Social Connections: Not on file  Intimate Partner Violence: Not on file   Family History  Problem Relation Age of Onset   Emphysema Mother    Stroke Father    Movement disorder Sister    Aneurysm Brother        Thoracic aorta   Macular degeneration Brother    BP 92/60    Pulse 63    Wt 68.5 kg (151 lb)    SpO2 96% Comment: 3l n/c   BMI 23.65 kg/m   Wt Readings from Last 3 Encounters:  10/24/21 68.5 kg (151 lb)  09/18/21 69.2 kg (152 lb 9.6 oz)  09/06/21 67.1 kg (147 lb 14.9 oz)   PHYSICAL EXAM: General:  Elderly male wearing O2 No resp difficulty HEENT: normal Neck: supple. JVP 8-9 Carotids 2+ bilat; no bruits. No lymphadenopathy or thryomegaly appreciated. Cor: PMI nondisplaced. Irregular rate & rhythm. 3/6 AS s2 ok Lungs: decreased throughout  Abdomen: soft, nontender, nondistended. No hepatosplenomegaly. No bruits or masses. Good bowel sounds. + C-tube  Extremities: no cyanosis, clubbing, rash, 2-3+ mid calf down R>>L edema Neuro: alert & orientedx3, cranial nerves grossly intact. moves all 4 extremities w/o difficulty. Affect  pleasant   ASSESSMENT & PLAN:  1. Pulmonary hypertension with cor pulmonale - Echo, CT, PFTs and VQ studies reviewed personally - He has evidence of significant pulmonary HTN with severe RV failure. Etiology remains unclear.  - CT with evidence of ILD but PFTs not that bad. VQ negative.  - PH multifactorial but out of proportion to HF & ILD so felt to have component of Group I disease. - RHC 2/19 c/w moderate PAH - Stable NYHA III Continue sildenafil 40 mg tid + macitentan 10 daily. No plans to add selexipag at this point - Volume status elevated on torsemide 60 mg daily. If edema not coming back down on current regimen, increase to torsemide 80 daily for 2 days and assess response. Can use metolazone as needed - Continue midodrine 15 mg tid. - Continue compression stockings. - Labs today.  2. Near syncope/symptomatic bradycardia - Zio 12/22 Predominant SR Rates 37-197 (avg 73). 3 short runs NSVT. ? Two brief episodes of HR in 30s due to CHB vs Wenckebach (hard to discern p waves) - Not interested in PPM unless absolutely required - Continue to follow   3. Chronic respiratory failure - Evidence of mild ILD on CT. PFTs not too bad. - Has severe OSA on Bipap. Continue BiPAP qHS - he uses it religiously - Continue supplemental O2. Sats stable - Follows with Pulmonary.  4. CAD s/p CABG - Myoview 1/19 normal.  - Managed by Dr. Gwenlyn Found.  - No s/s angina - On statin.  5. Aortic stenosis - Moderate by echo 9/22 mean gradient 30 AVA 1.1.  - Will follow.   6. Atrial fibrillation, paroxysmal - In NSR.  - Zio 12/22 as above - Not candidate for [facility] due to recurrent LGIB.  7. Cholecystitis - Continue C-tube with regular tube changes. - He is not candidate for cholecystectomy due to very high surgical risk.   Glori Bickers, MD  3:55 PM

## 2021-10-24 ENCOUNTER — Encounter (HOSPITAL_COMMUNITY): Payer: Self-pay | Admitting: Internal Medicine

## 2021-10-24 ENCOUNTER — Ambulatory Visit (HOSPITAL_COMMUNITY)
Admission: RE | Admit: 2021-10-24 | Discharge: 2021-10-24 | Disposition: A | Discharge status: Home / Self Care | Payer: Medicare Other | Source: Ambulatory Visit | Attending: Internal Medicine | Admitting: Internal Medicine

## 2021-10-24 ENCOUNTER — Other Ambulatory Visit: Payer: Self-pay

## 2021-10-24 VITALS — BP 92/60 | HR 63 | Wt 151.0 lb

## 2021-10-24 DIAGNOSIS — I4821 Permanent atrial fibrillation: Secondary | ICD-10-CM | POA: Diagnosis not present

## 2021-10-24 DIAGNOSIS — I350 Nonrheumatic aortic (valve) stenosis: Secondary | ICD-10-CM

## 2021-10-24 DIAGNOSIS — I50810 Right heart failure, unspecified: Secondary | ICD-10-CM | POA: Insufficient documentation

## 2021-10-24 DIAGNOSIS — I50812 Chronic right heart failure: Secondary | ICD-10-CM

## 2021-10-24 DIAGNOSIS — I2510 Atherosclerotic heart disease of native coronary artery without angina pectoris: Secondary | ICD-10-CM

## 2021-10-24 DIAGNOSIS — I272 Pulmonary hypertension, unspecified: Secondary | ICD-10-CM | POA: Diagnosis not present

## 2021-10-24 DIAGNOSIS — R001 Bradycardia, unspecified: Secondary | ICD-10-CM

## 2021-10-24 LAB — COMPREHENSIVE METABOLIC PANEL
ALT: 37 U/L (ref 0–44)
AST: 50 U/L — ABNORMAL HIGH (ref 15–41)
Albumin: 3.3 g/dL — ABNORMAL LOW (ref 3.5–5.0)
Alkaline Phosphatase: 101 U/L (ref 38–126)
Anion gap: 8 (ref 5–15)
BUN: 21 mg/dL (ref 8–23)
CO2: 27 mmol/L (ref 22–32)
Calcium: 8.4 mg/dL — ABNORMAL LOW (ref 8.9–10.3)
Chloride: 99 mmol/L (ref 98–111)
Creatinine, Ser: 1.03 mg/dL (ref 0.61–1.24)
GFR, Estimated: >60 mL/min (ref >60)
Glucose, Bld: 104 mg/dL — ABNORMAL HIGH (ref 70–99)
Potassium: 3.7 mmol/L (ref 3.5–5.1)
Sodium: 134 mmol/L — ABNORMAL LOW (ref 135–145)
Total Bilirubin: 1.3 mg/dL — ABNORMAL HIGH (ref 0.3–1.2)
Total Protein: 7.6 g/dL (ref 6.5–8.1)

## 2021-10-24 LAB — CBC
HCT: 38.3 % — ABNORMAL LOW (ref 39.0–52.0)
Hemoglobin: 13.1 g/dL (ref 13.0–17.0)
MCH: 32.9 pg (ref 26.0–34.0)
MCHC: 34.2 g/dL (ref 30.0–36.0)
MCV: 96.2 fL (ref 80.0–100.0)
Platelets: 259 10*3/uL (ref 150–400)
RBC: 3.98 MIL/uL — ABNORMAL LOW (ref 4.22–5.81)
RDW: 16.5 % — ABNORMAL HIGH (ref 11.5–15.5)
WBC: 9.2 10*3/uL (ref 4.0–10.5)
nRBC: 0 % (ref 0.0–0.2)

## 2021-10-24 LAB — BRAIN NATRIURETIC PEPTIDE: B Natriuretic Peptide: 245.7 pg/mL — ABNORMAL HIGH (ref 0.0–100.0)

## 2021-10-24 MED ORDER — TORSEMIDE 20 MG PO TABS: 60.0000 mg | ORAL_TABLET | Freq: Every day | ORAL | 11 refills | Status: DC

## 2021-10-24 NOTE — Patient Instructions (Signed)
Continue current medications.   Take torsemide 3 tablets daily. Keep legs elevated and stockings on tonight. If legs better in the morning do not make any changes.   If legs still swollen tomorrow morning take 4 tablets of torsemide (80mg  total) for 2 days (Wednesday and Thursday) and then switch back to 3 tablets daily. If no improvement by Friday. Please call the office on Friday and let us know..  Labs done today, your results will be available in MyChart, we will contact you for abnormal readings.   Your physician recommends that you schedule a follow-up appointment in: 2-3 months  If you have any questions or concerns before your next appointment please send [place] a message through [place] or call our office at [phone].    TO LEAVE A MESSAGE FOR THE NURSE SELECT OPTION 2, PLEASE LEAVE A MESSAGE INCLUDING: YOUR NAME DATE OF BIRTH CALL BACK NUMBER REASON FOR CALL**this is important as we prioritize the call backs  YOU WILL RECEIVE A CALL BACK THE SAME DAY AS LONG AS YOU CALL BEFORE 4:00 PM   At the [facility], you and your health needs are our priority. As part of our continuing mission to provide you with exceptional heart care, we have created designated Provider Care Teams. These Care Teams include your primary Cardiologist (physician) and Advanced Practice Providers (APPs- Physician Assistants and Nurse Practitioners) who all work together to provide you with the care you need, when you need it.   You may see any of the following providers on your designated Care Team at your next follow up: Dr Glori Bickers Dr Haynes Kerns, NP Lyda Jester, [place] [facility] [place], [place] Audry Riles, PharmD   Please be sure to bring in all your medications bottles to every appointment.

## 2021-10-25 ENCOUNTER — Other Ambulatory Visit (HOSPITAL_COMMUNITY): Payer: Self-pay

## 2021-10-25 NOTE — Telephone Encounter (Signed)
Sent in application via fax 78/71.  Will follow up.

## 2021-10-25 NOTE — Telephone Encounter (Signed)
Received communication from [employer] that they were missing some documentation from the patient. Called to confirm what was still needed. Was advised that the patient cannot partiicpate in [employer] assistance since he has insurance. Clarified that the patient has part A/B only and no prescription coverage. Application status has been updated and will continue to go through processing. Patient should receive a call with next steps.  Called and updated patient's daughter.

## 2021-11-01 NOTE — Telephone Encounter (Signed)
Advanced Heart Failure Patient Advocate Encounter  Spoke with patient's daughter Alphonsa Overall suggested that the patient apply for a TAF grant. The patient would not be eligible for this grant for medication coverage and only medical coverage. The grant is also currently closed. Called and spoke with Alphonsa Overall. They are going to expedite his application process and have been authorized to call the patient's daughter with an update. We were told she should get a call for an approval.  Called ad updated the patient's daughter. Advised her to call me with an update as soon as she hears anything.

## 2021-11-08 NOTE — Telephone Encounter (Signed)
Advanced Heart Failure Patient Advocate Encounter  Spoke with patient's daughter, Alphonsa Overall is requiring the patient to send in [place] for 2022. The patient's daughter has sent that in. Sharlyne Pacas, they have received the OOP and are escalating the application. Hoping to hear a determination by tomorrow. Called and left the patient's daughter an updated message.

## 2021-11-13 NOTE — Telephone Encounter (Signed)
Advanced Heart Failure Patient Advocate Encounter   Patient was approved to receive Ops[employer] from [place]  Effective dates: 11/10/21 through 10/21/22  Medication will be dispensed by [facility], [phone].  Called and spoke with patient. Left patient's daughter a vm.  Charlann Boxer, CPhT

## 2021-11-20 ENCOUNTER — Other Ambulatory Visit: Payer: Self-pay

## 2021-11-20 ENCOUNTER — Ambulatory Visit (HOSPITAL_COMMUNITY)
Admission: RE | Admit: 2021-11-20 | Discharge: 2021-11-20 | Disposition: A | Discharge status: Home / Self Care | Payer: Medicare Other | Source: Ambulatory Visit | Attending: Interventional Radiology | Admitting: Interventional Radiology

## 2021-11-20 DIAGNOSIS — Z434 Encounter for attention to other artificial openings of digestive tract: Secondary | ICD-10-CM | POA: Insufficient documentation

## 2021-11-20 DIAGNOSIS — K819 Cholecystitis, unspecified: Secondary | ICD-10-CM | POA: Insufficient documentation

## 2021-11-20 HISTORY — PX: IR EXCHANGE BILIARY DRAIN: IMG6046

## 2021-11-20 MED ORDER — IOHEXOL 300 MG/ML  SOLN
100.0000 mL | Freq: Once | INTRAMUSCULAR | Status: AC | PRN
  Administered 2021-11-20: 10 mL

## 2021-11-20 MED ORDER — LIDOCAINE HCL 1 % IJ SOLN
INTRAMUSCULAR | Status: AC
  Administered 2021-11-20: 10 mL
  Filled 2021-11-20: qty 20

## 2021-11-20 NOTE — Procedures (Signed)
Pre procedural Dx: Cholelithiasis, poor operative candidate. Post procedural Dx: Same  Successful fluoro guided exchange of 12 Fr chole tube. Chole tube connected to gravity bag.   EBL: None Complications: None immediate  Ronny Bacon, MD Pager #: [phone]

## 2021-11-30 NOTE — Telephone Encounter (Signed)
Advanced Heart Failure Patient Advocate Encounter  Patient received Opsumit via mail on Friday 02/03.  Charlann Boxer, CPhT

## 2021-12-04 ENCOUNTER — Other Ambulatory Visit (HOSPITAL_COMMUNITY): Payer: Self-pay | Admitting: Interventional Radiology

## 2021-12-04 ENCOUNTER — Other Ambulatory Visit (HOSPITAL_COMMUNITY): Payer: Self-pay

## 2021-12-04 DIAGNOSIS — K819 Cholecystitis, unspecified: Secondary | ICD-10-CM

## 2021-12-04 MED ORDER — NORMAL SALINE FLUSH 0.9 % IV SOLN
INTRAVENOUS | 0 refills | Status: DC
  Filled 2021-12-04: qty 200, 20d supply, fill #0

## 2021-12-07 ENCOUNTER — Telehealth: Payer: Self-pay

## 2021-12-07 NOTE — Telephone Encounter (Signed)
Message left to schedule palliative follow up appt

## 2021-12-22 NOTE — Progress Notes (Signed)
ADVANCED HF CLINIC NOTE   PCP: Katherina Mires, MD Primary Cardiologist: Dr. Gwenlyn Found HF Cardiologist: Dr. Haroldine Laws   HPI: Mr. Arts is 88 y.o. 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   He had CABG in 1998. He had an endoluminal stent graft for abdominal aortic aneurysm in [place] 10/09 . Pre-op cath showed patent grafts.  Doppler performed 2/18 revealed aortic dimensions of approximately 4.4 x 4.6 cm.Wears BIPAP for severe OSA. Has been on home O2 since 2017.   He was referred to me in 2/19 by Dr. Gwenlyn Found for further evaluation of moderate to severe PAH. He has tolerated combination therapy well with improvement of his symptoms.  Echo 11/19: EF 60-65% RV mild to moderately reduced. RVSP 53mmHG Mild AS.  Hospitalized in 2/21 for volume overload and hypoxia. Diuresed well. Switched to torsemide. Started on midodrine for low BP.   Admitted in 11/21 Sepsis due to acute cholecystitis with enterococcus and enterobacter. C- tube placed.  Echo 11/21 EF 60-65% RV moderately HK RVSP 85 AoV severely calcified. Moderate AS Personally reviewed  Hospitalized in 1/22 due COVID and aspiration PNA.   Admitted 9/22 with A/CHF and A/C hypoxemic respiratory failure. Echo showed EF 60-65%, breathing improved after several doses of IV lasix. Hospitalization c/b dysphagia, GI consulted but deferred endoscopic eval due to age and comorbidities. Swallowing difficulties resolved by discharge, d/c weight 148 lbs.  Admitted 11/22 with symptomatic bradycardia, HR in 30's. He was hemodynamically stable and HR improved to 50's. There was discussion about EP consult for PPM but he declined this. He was continued on midodrine. PT rec SNF but he declined, discharged back to assisted living facility, weight 147 lbs.  Zio 12/22 Predominant SR Rates 37-197 (avg 73). 3 short runs NSVT. ? Two brief episodes of HR in 30s due to CHB vs  Wenckebach (hard to discern p waves)  Follow up 1/23 legs were swelling, advised increasing torsemide to 80 daily if swelling did not resolve.   Today he returns for HF follow up with his daughter. Overall feeling fine. Living at [facility], wears 3L oxygen. No SOB walking on flat ground, generally feeling well. Legs started to swell a couple days ago, thinks he drank more than usual and forgot a day of his torsemide. Denies abnormal bleeding, palpitations, CP, dizziness, or PND/Orthopnea. Appetite ok. No fever or chills. Taking all medications. Asking for new BiPap Rx.   Cardiac Studies: - Echo (9/22): EF 60-65%, mod LVH, RV severely reduced, severe biatrial dilation, small pericardial effusion, mod MR, mod to severe TR  - RHC (12/11/17): RA = 6 RV = 72/7 PA = 73/27 (44) PCW = 10 Fick cardiac output/index = 4.1/2.1 PVR = 8.4 WU Ao sat = 91% PA sat = 58%, 58%   - Echo (11/18): EF 60-65% grade I DD  RV severely dilated moderate TR RVSP 23mmHG   - VQ (12/18): Normal perfusion lung scan.   Irregular peripheral ventilation in both lungs consistent with chronic parenchymal lung disease changes.  - PFTs (02/28/16): FVC 3.37 (116%), FEV1 2.08 (108%), F/F 62 Mild obstructive airway disease, no broncho-dilator response  - CT scan 07/15/15- 2 .3 x 1.7 cm right upper lobe calcified opacity likely to represent pleural , parenchymal scarring. Mild centrilobular, paraseptal emphysema. Subpleural reticulation, fibrosis representing mild chronic ILD High-resolution CT 09/04/16 - stable right upper lobe scarring, hyper  aerated lungs. Minimal interstitial lung disease and peripheral unchanged compared to 2016.  - Hi-res CT: 01/06/18: Stable ILD   Review of systems complete and found to be negative unless listed in HPI.   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     ferrous sulfate 325 (65 FE) MG tablet Take 325 mg by mouth daily with breakfast.     levothyroxine (SYNTHROID) 88 MCG tablet Take 88 mcg by mouth daily before breakfast.     macitentan (OPSUMIT) 10 MG tablet Take 1 tablet (10 mg total) by mouth daily. 30 tablet 3   midodrine (PROAMATINE) 10 MG tablet Take 1 tablet (10 mg total) by mouth 3 (three) times daily after meals. In addition with the 5mg  tablets 270 tablet 3   midodrine (PROAMATINE) 5 MG tablet Take 1 tablet (5 mg total) by mouth 3 (three) times daily with meals. In addition with the 10mg  tablets 270 tablet 3   montelukast (SINGULAIR) 10 MG tablet Take 10 mg by mouth at bedtime.     pantoprazole (PROTONIX) 20 MG tablet Take 20 mg by mouth every morning.     potassium chloride (KLOR-CON) 10 MEQ tablet Take 20 mEq by mouth daily.     sildenafil (REVATIO) 20 MG tablet TAKE TWO TABLETS BY MOUTH THREE TIMES A DAY 540 tablet 3   simvastatin (ZOCOR) 10 MG tablet Take 10 mg by mouth at bedtime.     Sodium Chloride Flush (NORMAL SALINE FLUSH) 0.9 % SOLN Use as directed 200 mL 0   torsemide (DEMADEX) 20 MG tablet Take 3 tablets (60 mg total) by mouth daily. 60 tablet 11   No current facility-administered medications for this encounter.   Facility-Administered Medications  Ordered in Other Encounters  Medication Dose Route Frequency Provider Last Rate Last Admin   gemcitabine (GEMZAR) chemo syringe for bladder instillation 2,000 mg  2,000 mg Bladder Instillation Once Franchot Gallo, MD       Allergies  Allergen Reactions   Oxycodone     Other reaction(s): Delusions (intolerance), Hallucinations Wiped him out Wiped him out    Hydrocodone Other (See Comments)   Social History   Socioeconomic History   Marital status: Widowed    Spouse name: Not on file   Number of children: Not on file   Years of education: Not on file   Highest education level: Not on file  Occupational History   Not on file  Tobacco Use   Smoking status: Former    Packs/day: 0.50    Types: Cigarettes  Quit date: 08/06/1961    Years since quitting: 60.4   Smokeless tobacco: Never  Vaping Use   Vaping Use: Never used  Substance and Sexual Activity   Alcohol use: Not Currently   Drug use: Never   Sexual activity: Not Currently  Other Topics Concern   Not on file  Social History Narrative   Not on file   Social Determinants of Health   Financial Resource Strain: Not on file  Food Insecurity: Not on file  Transportation Needs: Not on file  Physical Activity: Not on file  Stress: Not on file  Social Connections: Not on file  Intimate Partner Violence: Not on file   Family History  Problem Relation Age of Onset   Emphysema Mother    Stroke Father    Movement disorder Sister    Aneurysm Brother        Thoracic aorta   Macular degeneration Brother    BP (!) 90/52    Pulse 81    Wt 67.8 kg (149 lb 6.4 oz)    SpO2 92%    BMI 23.40 kg/m   Wt Readings from Last 3 Encounters:  12/25/21 67.8 kg (149 lb 6.4 oz)  10/24/21 68.5 kg (151 lb)  09/18/21 69.2 kg (152 lb 9.6 oz)   PHYSICAL EXAM: General:  NAD. No resp difficulty, eldery, walked into clinic on oxygen. HEENT: Normal Neck: Supple. No JVD. Carotids 2+ bilat; no bruits. No lymphadenopathy or thryomegaly  appreciated. Cor: PMI nondisplaced. Irregular rate & rhythm. No rubs, gallops, 3/6 AS, aduible S2 Lungs: Diminished in bases Abdomen: Soft, nontender, nondistended. No hepatosplenomegaly. No bruits or masses. Good bowel sounds; + C-tube Extremities: No cyanosis, clubbing, rash, 2+ BLE edema R>L Neuro: Alert & oriented x 3, cranial nerves grossly intact. Moves all 4 extremities w/o difficulty. Affect pleasant.  ASSESSMENT & PLAN:  1. Pulmonary hypertension with cor pulmonale - Echo, CT, PFTs and VQ studies reviewed personally - He has evidence of significant pulmonary HTN with severe RV failure. Etiology remains unclear.  - CT with evidence of ILD but PFTs not that bad. VQ negative.  - PH multifactorial but out of proportion to HF & ILD so felt to have component of Group I disease. - RHC 2/19 c/w moderate PAH - Stable NYHA III Continue sildenafil 40 mg tid + macitentan 10 daily. No plans to add selexipag at this point - Volume status elevated on torsemide 60 mg daily. Discussed increasing to 80 day, however he would like to monitor on current dose. If edema not coming back down in a couple days, increase to torsemide 80 daily for 2 days and assess response. Can use metolazone as needed. - Continue midodrine 10 mg tid. - Continue compression stockings. - BMET and BNP today.  2. Near syncope/symptomatic bradycardia - Zio 12/22 Predominant SR Rates 37-197 (avg 73). 3 short runs NSVT. ? Two brief episodes of HR in 30s due to CHB vs Wenckebach (hard to discern p waves) - Not interested in PPM unless absolutely required - Continue to follow.   3. Chronic respiratory failure - Evidence of mild ILD on CT. PFTs not too bad. - Has severe OSA on Bipap. Continue BiPAP qHS - he uses it religiously - Continue supplemental O2. Sats stable - Follows with Pulmonary. - Will send updated Rx to [facility] for BiPap.  4. CAD s/p CABG - Myoview 1/19 normal.  - Managed by Dr. Gwenlyn Found.  - No s/s angina. - On  statin.  5. Aortic stenosis -  Moderate by echo 9/22 mean gradient 30 AVA 1.1.  - Will follow.   6. Atrial fibrillation, paroxysmal - Zio 12/22 as above - Not candidate for [facility] due to recurrent LGIB.  7. Cholecystitis - Continue C-tube with regular tube changes. - He is not candidate for cholecystectomy due to very high surgical risk.  Follow up with APP in 6 weeks (check fluid) and in 4 months with Dr. Haroldine Laws  Rafael Bihari, FNP  2:31 PM

## 2021-12-25 ENCOUNTER — Other Ambulatory Visit (HOSPITAL_COMMUNITY): Payer: Self-pay | Admitting: Cardiology

## 2021-12-25 ENCOUNTER — Encounter (HOSPITAL_COMMUNITY): Payer: Self-pay

## 2021-12-25 ENCOUNTER — Ambulatory Visit (HOSPITAL_COMMUNITY)
Admission: RE | Admit: 2021-12-25 | Discharge: 2021-12-25 | Disposition: A | Discharge status: Home / Self Care | Payer: Medicare Other | Source: Ambulatory Visit | Attending: Family Medicine | Admitting: Family Medicine

## 2021-12-25 ENCOUNTER — Other Ambulatory Visit: Payer: Self-pay

## 2021-12-25 VITALS — BP 90/52 | HR 81 | Wt 149.4 lb

## 2021-12-25 DIAGNOSIS — I50810 Right heart failure, unspecified: Secondary | ICD-10-CM | POA: Diagnosis not present

## 2021-12-25 DIAGNOSIS — I50812 Chronic right heart failure: Secondary | ICD-10-CM

## 2021-12-25 DIAGNOSIS — G4733 Obstructive sleep apnea (adult) (pediatric): Secondary | ICD-10-CM | POA: Diagnosis not present

## 2021-12-25 DIAGNOSIS — Z8679 Personal history of other diseases of the circulatory system: Secondary | ICD-10-CM | POA: Insufficient documentation

## 2021-12-25 DIAGNOSIS — I2729 Other secondary pulmonary hypertension: Secondary | ICD-10-CM | POA: Diagnosis not present

## 2021-12-25 DIAGNOSIS — J9610 Chronic respiratory failure, unspecified whether with hypoxia or hypercapnia: Secondary | ICD-10-CM | POA: Insufficient documentation

## 2021-12-25 DIAGNOSIS — Z79899 Other long term (current) drug therapy: Secondary | ICD-10-CM | POA: Insufficient documentation

## 2021-12-25 DIAGNOSIS — J432 Centrilobular emphysema: Secondary | ICD-10-CM | POA: Insufficient documentation

## 2021-12-25 DIAGNOSIS — R001 Bradycardia, unspecified: Secondary | ICD-10-CM

## 2021-12-25 DIAGNOSIS — R55 Syncope and collapse: Secondary | ICD-10-CM | POA: Diagnosis not present

## 2021-12-25 DIAGNOSIS — Z951 Presence of aortocoronary bypass graft: Secondary | ICD-10-CM | POA: Insufficient documentation

## 2021-12-25 DIAGNOSIS — I350 Nonrheumatic aortic (valve) stenosis: Secondary | ICD-10-CM | POA: Insufficient documentation

## 2021-12-25 DIAGNOSIS — I2510 Atherosclerotic heart disease of native coronary artery without angina pectoris: Secondary | ICD-10-CM | POA: Diagnosis not present

## 2021-12-25 DIAGNOSIS — K819 Cholecystitis, unspecified: Secondary | ICD-10-CM | POA: Insufficient documentation

## 2021-12-25 DIAGNOSIS — J9611 Chronic respiratory failure with hypoxia: Secondary | ICD-10-CM | POA: Diagnosis not present

## 2021-12-25 DIAGNOSIS — I480 Paroxysmal atrial fibrillation: Secondary | ICD-10-CM | POA: Diagnosis not present

## 2021-12-25 LAB — BASIC METABOLIC PANEL
Anion gap: 11 (ref 5–15)
BUN: 23 mg/dL (ref 8–23)
CO2: 25 mmol/L (ref 22–32)
Calcium: 8.5 mg/dL — ABNORMAL LOW (ref 8.9–10.3)
Chloride: 100 mmol/L (ref 98–111)
Creatinine, Ser: 1.19 mg/dL (ref 0.61–1.24)
GFR, Estimated: 56 mL/min — ABNORMAL LOW (ref >60)
Glucose, Bld: 95 mg/dL (ref 70–99)
Potassium: 3.7 mmol/L (ref 3.5–5.1)
Sodium: 136 mmol/L (ref 135–145)

## 2021-12-25 LAB — BRAIN NATRIURETIC PEPTIDE: B Natriuretic Peptide: 220.8 pg/mL — ABNORMAL HIGH (ref 0.0–100.0)

## 2021-12-25 NOTE — Patient Instructions (Signed)
Thank you for coming in today ? ?No medication changes ? ?Labs were done today, if any labs are abnormal the clinic will call you ? ?Your physician recommends that you schedule a follow-up appointment in:  ?6 weeks in clinic  ?4 months with Dr. Haroldine Laws ? ?At the [facility], you and your health needs are our priority. As part of our continuing mission to provide you with exceptional heart care, we have created designated Provider Care Teams. These Care Teams include your primary Cardiologist (physician) and Advanced Practice Providers (APPs- Physician Assistants and Nurse Practitioners) who all work together to provide you with the care you need, when you need it.  ? ?You may see any of the following providers on your designated Care Team at your next follow up: ?Dr Glori Bickers ?Dr Loralie Champagne ?Darrick Grinder, NP ?Lyda Jester, PA ?Jessica Milford,NP ?Marlyce Huge, PA ?Audry Riles, PharmD ? ? ?Please be sure to bring in all your medications bottles to every appointment.  ? ?If you have any questions or concerns before your next appointment please send [place] a message through [place] or call our office at [phone].   ? ?TO LEAVE A MESSAGE FOR THE NURSE SELECT OPTION 2, PLEASE LEAVE A MESSAGE INCLUDING: ?YOUR NAME ?DATE OF BIRTH ?CALL BACK NUMBER ?REASON FOR CALL**this is important as we prioritize the call backs ? ?YOU WILL RECEIVE A CALL BACK THE SAME DAY AS LONG AS YOU CALL BEFORE 4:00 PM ? ?

## 2022-01-02 ENCOUNTER — Telehealth: Payer: Self-pay | Admitting: Pulmonary Disease

## 2022-01-02 DIAGNOSIS — J849 Interstitial pulmonary disease, unspecified: Secondary | ICD-10-CM

## 2022-01-02 DIAGNOSIS — G4733 Obstructive sleep apnea (adult) (pediatric): Secondary | ICD-10-CM

## 2022-01-02 NOTE — Telephone Encounter (Signed)
Spoke to patient's daughter, Sharon(DPR). ?[name] is requesting a new order for bipap. Current machine is broken. Patient last seen 11/16/2019.appt schedule 01/22/2022. ? ?Dr. Vaughan Browner, please advise. Thanks ?

## 2022-01-03 NOTE — Telephone Encounter (Signed)
Routing back to Dr. Vaughan Browner as high priority. ?

## 2022-01-03 NOTE — Telephone Encounter (Signed)
Order has been placed for pt to receive a new bipap machine. Called and spoke with pt's daughter Vincent Robinson letting her know this had been done and she verbalized understanding. Nothing further needed. ?

## 2022-01-03 NOTE — Telephone Encounter (Signed)
Okay to place new order for BiPAP machine. ?

## 2022-01-04 ENCOUNTER — Telehealth: Payer: Self-pay | Admitting: Pulmonary Disease

## 2022-01-05 NOTE — Telephone Encounter (Signed)
Per note on order Terri w/ [facility] has confirmed they rec'd the order.  ?

## 2022-01-11 ENCOUNTER — Other Ambulatory Visit: Payer: Self-pay | Admitting: Radiology

## 2022-01-15 ENCOUNTER — Other Ambulatory Visit (HOSPITAL_COMMUNITY): Payer: Self-pay | Admitting: Interventional Radiology

## 2022-01-15 ENCOUNTER — Ambulatory Visit (HOSPITAL_COMMUNITY)
Admission: RE | Admit: 2022-01-15 | Discharge: 2022-01-15 | Disposition: A | Discharge status: Home / Self Care | Payer: Medicare Other | Source: Ambulatory Visit | Attending: Interventional Radiology | Admitting: Interventional Radiology

## 2022-01-15 ENCOUNTER — Other Ambulatory Visit: Payer: Self-pay

## 2022-01-15 DIAGNOSIS — Z434 Encounter for attention to other artificial openings of digestive tract: Secondary | ICD-10-CM | POA: Diagnosis not present

## 2022-01-15 DIAGNOSIS — K819 Cholecystitis, unspecified: Secondary | ICD-10-CM

## 2022-01-15 HISTORY — PX: IR EXCHANGE BILIARY DRAIN: IMG6046

## 2022-01-15 MED ORDER — IOHEXOL 300 MG/ML  SOLN
50.0000 mL | Freq: Once | INTRAMUSCULAR | Status: AC | PRN
  Administered 2022-01-15: 10 mL

## 2022-01-15 MED ORDER — LIDOCAINE HCL 1 % IJ SOLN
INTRAMUSCULAR | Status: AC
  Filled 2022-01-15: qty 20

## 2022-01-15 MED ORDER — IOHEXOL 300 MG/ML  SOLN: 100.0000 mL | Freq: Once | INTRAMUSCULAR | Status: DC | PRN

## 2022-01-22 ENCOUNTER — Encounter: Payer: Self-pay | Admitting: Pulmonary Disease

## 2022-01-22 ENCOUNTER — Ambulatory Visit (INDEPENDENT_AMBULATORY_CARE_PROVIDER_SITE_OTHER): Payer: Medicare Other | Admitting: Pulmonary Disease

## 2022-01-22 VITALS — BP 112/56 | HR 79 | Temp 97.6°F | Ht 67.0 in | Wt 150.0 lb

## 2022-01-22 DIAGNOSIS — J849 Interstitial pulmonary disease, unspecified: Secondary | ICD-10-CM | POA: Diagnosis not present

## 2022-01-22 DIAGNOSIS — I2510 Atherosclerotic heart disease of native coronary artery without angina pectoris: Secondary | ICD-10-CM

## 2022-01-22 DIAGNOSIS — G4733 Obstructive sleep apnea (adult) (pediatric): Secondary | ICD-10-CM

## 2022-01-22 NOTE — Patient Instructions (Addendum)
We will put in a order for astral 150 device with inspiratory pressure 20 cm, expiratory pressure 6 cm, pressure support 8 cm with 3 L oxygen ?We will schedule high-res CT ?Follow-up in clinic in 6 months ?

## 2022-01-22 NOTE — Progress Notes (Addendum)
? ?      ?Vincent Robinson    [id number]    April 14, 1923 ? ?Primary Care Physician:Briscoe, Jannifer Rodney, MD ? ?Referring Physician: Katherina Mires, MD ?[address] ?[address] ?[place],  [place] [address] ? ?Chief complaint:   ?Follow up for COPD ?Mild ILD, post inflammatory scarring ?Spontaneous pneumothorax July 2018 > resolved ? ?HPI: ?Mr. Faries is 88 year old with past history of COPD, hypersomnia with sleep apnea, allergic rhinitis, cough variant asthma. He recently moved from [place] to be close to his family and wants to establish care here. He is chief complaints of dyspnea on exertion. He has history of sleep apnea on BiPAP at settings of 20/6. This is report of CT of the chest from [place] which shows calcified nodule, chronic scarring. 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 He moved to [place] in 1956. Apparently his immigration was delayed due to a lung abnormality on imaging. He was eventually cleared. He does not have any history of TB, exposure to TB. He's had an episode of influenza while in [place]. ? ?Hospitalized in July, 2018 for a spontaneous pneumothorax that was treated conservatively. He was again readmitted in August 2018 for lower GI bleed. During this admission he was noted to be in acute hypoxic respiratory failure secondary to diastolic heart failure. He improved with diuresis. ?Follows with Dr. Alvester Chou and Dr. Haroldine Laws for heart failure, pulmonary hypertension.   ? ?Interim history: ?Back in pulmonary clinic for follow-up after gap of 2 years ?He continues follow-up with Dr. Haroldine Laws for primary pulmonary hypertension and is on OPSUMIT and sildenafil.  ? ?Complains of gradual increase in dyspnea on exertion ?Continues on BiPAP which is nearing the end of its life span ? ?Outpatient Encounter Medications as of 01/22/2022  ?Medication Sig  ? albuterol  (VENTOLIN HFA) 108 (90 Base) MCG/ACT inhaler Inhale 1 puff into the lungs every 6 (six) hours as needed for wheezing or shortness of breath.  ? ferrous sulfate 325 (65 FE) MG tablet Take 325 mg by mouth daily with breakfast.  ? levothyroxine (SYNTHROID) 88 MCG tablet Take 88 mcg by mouth daily before breakfast.  ? macitentan (OPSUMIT) 10 MG tablet Take 1 tablet (10 mg total) by mouth daily.  ? midodrine (PROAMATINE) 10 MG tablet Take 1 tablet (10 mg total) by mouth 3 (three) times daily after meals. In addition with the '5mg'$  tablets  ? midodrine (PROAMATINE) 5 MG tablet Take 1 tablet (5 mg total) by mouth 3 (three) times daily with meals. In addition with the '10mg'$  tablets  ? montelukast (SINGULAIR) 10 MG tablet Take 10 mg by mouth at bedtime.  ? pantoprazole (PROTONIX) 20 MG tablet Take 20 mg by mouth every morning.  ? potassium chloride (KLOR-CON) 10 MEQ tablet Take 20 mEq by mouth daily.  ? sildenafil (REVATIO) 20 MG tablet TAKE TWO TABLETS BY MOUTH THREE TIMES A DAY  ? simvastatin (ZOCOR) 10 MG tablet Take 10 mg by mouth at bedtime.  ? Sodium Chloride Flush (NORMAL SALINE FLUSH) 0.9 % SOLN Use as directed  ? torsemide (DEMADEX) 20 MG tablet Take 3 tablets (60 mg total) by mouth daily.  ? ?Facility-Administered Encounter Medications as of 01/22/2022  ?Medication  ? gemcitabine (GEMZAR) chemo syringe for bladder instillation 2,000 mg  ? ?Physical Exam: ?Blood pressure (!) 112/56, pulse 79, temperature 97.6 ?F (  36.4 ?C), temperature source Oral, height '5\' 7"'$  (1.702 m), weight 150 lb (68 kg), SpO2 90 %. ?Gen:      No acute distress ?HEENT:  EOMI, sclera anicteric ?Neck:     No masses; no thyromegaly ?Lungs:    Bibasal crackles ?CV:         Regular rate and rhythm; no murmurs ?Abd:      + bowel sounds; soft, non-tender; no palpable masses, no distension ?Ext:    No edema; adequate peripheral perfusion ?Skin:      Warm and dry; no rash ?Neuro: alert and oriented x 3 ?Psych: normal mood and affect  ? ?Data Reviewed: ?PFTs  02/28/16 ?FVC 3.37 (116%), FEV1 2.08 (108%), F/F 62 ?Mild obstructive airway disease, no broncho-dilator response ? ?CT scan 07/15/15- 2 .3 x 1.7 cm right upper lobe calcified opacity likely to represent pleural , parenchymal scarring. Mild centrilobular, paraseptal emphysema. Subpleural reticulation, fibrosis representing mild chronic ILD ?High-resolution CT 09/04/16 - stable right upper lobe scarring, hyper aerated lungs. Minimal interstitial lung disease and peripheral unchanged compared to 2016. ?Chest x-ray 05/20/17 - moderate right apical pneumothorax ?Chest x-ray 06/04/17- resolution of right apical pneumothorax. Chronic interstitial opacities ?High-resolution CT 01/06/18-interstitial lung disease.  Unchanged compared to 2017.  Stable mediastinal adenopathy and ascending thoracic aneurysm. ?I reviewed the images personally. ? ?BiPAP download 01/21/2022 ?Time spent greater than 4 hours- 93% ?AHI 2.4 ? ?Assessment:  ?COPD, asthmatic bronchitis. ?Continue supplemental oxygen, ?He is just on albuterol and not on controller medication. ? ?Mild ILD, postinflammatory lung fibrosis ?Previous CTs have apparently shown a calcified RUL scarring and mild ILD. The repeat high res resolution CT shows stability of findings from 2016 - 2019. I believe this may be from prior infections and exposures during his time in [place] and during world war.   ? ?Given worsening dyspnea on exertion we will get a repeat high-res CT ? ?OSA ?Continues on BiPAP with good compliance. ?We will switch him over to astral device as his current BiPAP is breaking down. ? ?We are ordering non-invasive Ventilator to treat patient's Chronic Respiratory Failure secondary to COPD to reduce hospitalization and sustain life. ? ?Heart failure, pulmonary hypertension ?Follow with cardiology and advanced heart failure service  ?On sildenafil, opsumit ? ?Health maintenance ?08/05/2017- Prevnar 13 ?10/22/2002- Pneumovax ? ?Plan/Recommendations: ?- Change noninvasive  ventilation astral device ?- High-res CT ? ?Marshell Garfinkel MD ?[facility] ?01/22/2022, 3:35 PM ? ?CC: Katherina Mires, MD ? ? ?

## 2022-01-28 ENCOUNTER — Other Ambulatory Visit (HOSPITAL_COMMUNITY): Payer: Self-pay | Admitting: Internal Medicine

## 2022-02-05 ENCOUNTER — Other Ambulatory Visit (HOSPITAL_COMMUNITY): Payer: Self-pay | Admitting: Internal Medicine

## 2022-02-05 ENCOUNTER — Ambulatory Visit (HOSPITAL_COMMUNITY)
Admission: RE | Admit: 2022-02-05 | Discharge: 2022-02-05 | Disposition: A | Discharge status: Home / Self Care | Payer: Medicare Other | Source: Ambulatory Visit | Attending: Pulmonary Disease | Admitting: Pulmonary Disease

## 2022-02-05 DIAGNOSIS — J849 Interstitial pulmonary disease, unspecified: Secondary | ICD-10-CM | POA: Diagnosis present

## 2022-02-06 NOTE — Progress Notes (Signed)
? ?ADVANCED HF CLINIC NOTE ? ? ?PCP: Katherina Mires, MD ?Primary Cardiologist: Dr. Gwenlyn Found ?HF Cardiologist: Dr. Haroldine Laws  ? ?HPI: ?Mr. Coopman is 88 y.o. male with COPD, OSA, PAF, CAD s/p CABG 1998, AAA s/p EVAR in 10/09 and PAH ? 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  ? ?He had CABG in 1998. He had an endoluminal stent graft for abdominal aortic aneurysm in [place] 10/09 . Pre-op cath showed patent grafts.  Doppler performed 2/18 revealed aortic dimensions of approximately 4.4 x 4.6 cm.Wears BIPAP for severe OSA. Has been on home O2 since 2017.  ? ?He was referred to me in 2/19 by Dr. Gwenlyn Found for further evaluation of moderate to severe PAH. He has tolerated combination therapy well with improvement of his symptoms. ? ?Echo 11/19: EF 60-65% RV mild to moderately reduced. RVSP 40mHG Mild AS. ? ?Hospitalized in 2/21 for volume overload and hypoxia. Diuresed well. Switched to torsemide. Started on midodrine for low BP.  ? ?Admitted in 11/21 Sepsis due to acute cholecystitis with enterococcus and enterobacter. C- tube placed. ? ?Echo 11/21 EF 60-65% RV moderately HK RVSP 85 AoV severely calcified. Moderate AS Personally reviewed ? ?Hospitalized in 1/22 due COVID and aspiration PNA.  ? ?Admitted 9/22 with A/CHF and A/C hypoxemic respiratory failure. Echo showed EF 60-65%, breathing improved after several doses of IV lasix. Hospitalization c/b dysphagia, GI consulted but deferred endoscopic eval due to age and comorbidities. Swallowing difficulties resolved by discharge, d/c weight 148 lbs. ? ?Admitted 11/22 with symptomatic bradycardia, HR in 30's. He was hemodynamically stable and HR improved to 50's. There was discussion about EP consult for PPM but he declined this. He was continued on midodrine. PT rec SNF but he declined, discharged back to assisted living facility, weight 147 lbs. ? ?Zio 12/22 Predominant SR Rates 37-197 (avg 73). 3 short runs NSVT. ? Two brief episodes of HR in 30s due to CHB vs  Wenckebach (hard to discern p waves) ? ?Follow up 1/23 legs were swelling, advised increasing torsemide to 80 daily if swelling did not resolve.  ? ?Today he returns for HF follow up with his daughter. Overall feeling fine. [facility]. No SOB walking on flat ground, generally feeling well. Legs started to swell a couple days ago, thinks he drank more than usual and forgot a day of his torsemide. Denies abnormal bleeding, palpitations, CP, dizziness, or PND/Orthopnea. Appetite ok. No fever or chills. Taking all medications. Asking for new BiPap Rx. ? ?Today she returns for HF follow up. Overall feeling fine. Denies increasing SOB, CP, dizziness, edema, or PND/Orthopnea. Appetite ok. No fever or chills. Weight at home 170 pounds. Taking all medications.  ? ? ? ?Cardiac Studies: ?- Echo (9/22): EF 60-65%, mod LVH, RV severely reduced, severe biatrial dilation, small pericardial effusion, mod MR, mod to severe TR ? ?- RHC (12/11/17): ?RA = 6 ?RV = 72/7 ?PA = 73/27 (44) ?PCW = 10 ?Fick cardiac output/index = 4.1/2.1 ?PVR = 8.4 WU ?Ao sat = 91% ?PA sat = 58%, 58% ?  ?- Echo (11/18): EF 60-65% grade I DD  RV severely dilated moderate TR RVSP 86mG  ? ?- VQ (12/18): ?Normal perfusion lung scan. ?  ?Irregular peripheral ventilation in both lungs consistent with ?chronic parenchymal lung disease changes. ? ?- PFTs (02/28/16): ?FVC 3.37 (116%), FEV1 2.08 (108%), F/F 62 ?Mild obstructive airway disease, no broncho-dilator response ? ?- CT scan 07/15/15- 2 .3 x  1.7 cm right upper lobe calcified opacity likely to represent pleural , parenchymal scarring. Mild centrilobular, paraseptal emphysema. Subpleural reticulation, fibrosis representing mild chronic ILD ?High-resolution CT 09/04/16 - stable right upper lobe scarring, hyper aerated lungs. Minimal interstitial lung disease and peripheral unchanged compared to 2016. ? ?- Hi-res CT: 01/06/18: Stable ILD  ? ?Review of systems complete and  found to be negative unless listed in HPI.  ? ?Past Medical History:  ?Diagnosis Date  ? AAA (abdominal aortic aneurysm) ([place])   ? Acute respiratory failure ([place]) 11/23/2019  ? Asthma   ? Basal cell carcinoma   ? CHF (congestive heart failure) ([place])   ? Chronic respiratory failure ([place])   ? COPD (chronic obstructive pulmonary disease) ([place])   ? Coronary artery disease   ? Diverticulitis   ? Dysrhythmia   ? afib  ? History of chronic respiratory failure   ? Hyperlipidemia   ? Hypothyroidism   ? Moderate aortic stenosis   ? Myocardial infarction [facility])   ? Pneumonia   ? Pulmonary HTN ([place])   ? Right heart failure ([place])   ? Skin cancer 2017  ? bladder   ? Sleep apnea   ? Bipap  ? ?Current Outpatient Medications  ?Medication Sig Dispense Refill  ? albuterol (VENTOLIN HFA) 108 (90 Base) MCG/ACT inhaler Inhale 1 puff into the lungs every 6 (six) hours as needed for wheezing or shortness of breath.    ? ferrous sulfate 325 (65 FE) MG tablet Take 325 mg by mouth daily with breakfast.    ? levothyroxine (SYNTHROID) 88 MCG tablet Take 88 mcg by mouth daily before breakfast.    ? midodrine (PROAMATINE) 10 MG tablet Take 1 tablet (10 mg total) by mouth 3 (three) times daily after meals. In addition with the '5mg'$  tablets 270 tablet 3  ? midodrine (PROAMATINE) 5 MG tablet TAKE ONE TABLET BY MOUTH THREE TIMES A DAY WITH A MEAL -TAKE IN ADDITION TO '10MG'$  TABLETS TO EQUAL '15MG'$  THREE TIMES A DAY 270 tablet 3  ? montelukast (SINGULAIR) 10 MG tablet Take 10 mg by mouth at bedtime.    ? OPSUMIT 10 MG tablet TAKE 1 TABLET BY MOUTH ONCE DAILY 30 tablet 0  ? pantoprazole (PROTONIX) 20 MG tablet Take 20 mg by mouth every morning.    ? potassium chloride (KLOR-CON) 10 MEQ tablet Take 20 mEq by mouth daily.    ? sildenafil (REVATIO) 20 MG tablet TAKE TWO TABLETS BY MOUTH THREE TIMES A DAY 180 tablet 3  ? simvastatin (ZOCOR) 10 MG tablet Take 10 mg by mouth at bedtime.    ? Sodium Chloride Flush (NORMAL SALINE FLUSH) 0.9 % SOLN Use as directed  200 mL 0  ? torsemide (DEMADEX) 20 MG tablet Take 3 tablets (60 mg total) by mouth daily. 60 tablet 11  ? ?No current facility-administered medications for this encounter.  ? ?Facility-Administered Medications Ordered in Other Encounters  ?Medication Dose Route Frequency Provider Last Rate Last Admin  ? gemcitabine (GEMZAR) chemo syringe for bladder instillation 2,000 mg  2,000 mg Bladder Instillation Once Franchot Gallo, MD      ? ?Allergies  ?Allergen Reactions  ? Oxycodone   ?  Other reaction(s): Delusions (intolerance), Hallucinations ?Wiped him out ?Wiped him out ?  ? Hydrocodone Other (See Comments)  ? ?Social History  ? ?Socioeconomic History  ? Marital status: Widowed  ?  Spouse name: Not on file  ? Number of children: Not on file  ? Years of education: Not  on file  ? Highest education level: Not on file  ?Occupational History  ? Not on file  ?Tobacco Use  ? Smoking status: Former  ?  Packs/day: 0.50  ?  Types: Cigarettes  ?  Quit date: 08/06/1961  ?  Years since quitting: 60.5  ? Smokeless tobacco: Never  ?Vaping Use  ? Vaping Use: Never used  ?Substance and Sexual Activity  ? Alcohol use: Not Currently  ? Drug use: Never  ? Sexual activity: Not Currently  ?Other Topics Concern  ? Not on file  ?Social History Narrative  ? Not on file  ? ?Social Determinants of Health  ? ?Financial Resource Strain: Not on file  ?Food Insecurity: Not on file  ?Transportation Needs: Not on file  ?Physical Activity: Not on file  ?Stress: Not on file  ?Social Connections: Not on file  ?Intimate Partner Violence: Not on file  ? ?Family History  ?Problem Relation Age of Onset  ? Emphysema Mother   ? Stroke Father   ? Movement disorder Sister   ? Aneurysm Brother   ?     Thoracic aorta  ? Macular degeneration Brother   ? ?BP 110/66   Pulse 74   Wt 66.4 kg (146 lb 6.4 oz)   SpO2 (!) 87% Comment: on 3L  BMI 22.93 kg/m?  ? ?Wt Readings from Last 3 Encounters:  ?02/08/22 66.4 kg (146 lb 6.4 oz)  ?01/22/22 68 kg (150 lb)   ?12/25/21 67.8 kg (149 lb 6.4 oz)  ? ?PHYSICAL EXAM: ?General:  NAD. No resp difficulty, elderly, walked into clinic with RW on oxygen ?HEENT: Normal ?Neck: Supple. No JVD. Carotids 2+ bilat; no bruits. No lymphadenopa

## 2022-02-07 ENCOUNTER — Telehealth: Payer: Self-pay | Admitting: Pulmonary Disease

## 2022-02-07 NOTE — Telephone Encounter (Signed)
Form was received via fax and given to Dr. Vaughan Browner at clinic this afternoon to sign.  ?

## 2022-02-07 NOTE — Telephone Encounter (Signed)
Vincent Robinson, please advise if you have received forms on pt from [place] that will need to be filled out by Dr. Vaughan Browner. ?

## 2022-02-08 ENCOUNTER — Encounter (HOSPITAL_COMMUNITY): Payer: Self-pay

## 2022-02-08 ENCOUNTER — Telehealth: Payer: Self-pay | Admitting: Pulmonary Disease

## 2022-02-08 ENCOUNTER — Ambulatory Visit (HOSPITAL_COMMUNITY)
Admission: RE | Admit: 2022-02-08 | Discharge: 2022-02-08 | Disposition: A | Discharge status: Home / Self Care | Payer: Medicare Other | Source: Ambulatory Visit | Attending: Family Medicine | Admitting: Family Medicine

## 2022-02-08 VITALS — BP 110/66 | HR 74 | Wt 146.4 lb

## 2022-02-08 DIAGNOSIS — J849 Interstitial pulmonary disease, unspecified: Secondary | ICD-10-CM | POA: Insufficient documentation

## 2022-02-08 DIAGNOSIS — K819 Cholecystitis, unspecified: Secondary | ICD-10-CM | POA: Insufficient documentation

## 2022-02-08 DIAGNOSIS — R001 Bradycardia, unspecified: Secondary | ICD-10-CM

## 2022-02-08 DIAGNOSIS — J9610 Chronic respiratory failure, unspecified whether with hypoxia or hypercapnia: Secondary | ICD-10-CM | POA: Diagnosis not present

## 2022-02-08 DIAGNOSIS — I50810 Right heart failure, unspecified: Secondary | ICD-10-CM | POA: Diagnosis not present

## 2022-02-08 DIAGNOSIS — R55 Syncope and collapse: Secondary | ICD-10-CM | POA: Diagnosis not present

## 2022-02-08 DIAGNOSIS — I2510 Atherosclerotic heart disease of native coronary artery without angina pectoris: Secondary | ICD-10-CM | POA: Diagnosis not present

## 2022-02-08 DIAGNOSIS — G4733 Obstructive sleep apnea (adult) (pediatric): Secondary | ICD-10-CM | POA: Insufficient documentation

## 2022-02-08 DIAGNOSIS — I50812 Chronic right heart failure: Secondary | ICD-10-CM

## 2022-02-08 DIAGNOSIS — J9611 Chronic respiratory failure with hypoxia: Secondary | ICD-10-CM | POA: Diagnosis not present

## 2022-02-08 DIAGNOSIS — Z79899 Other long term (current) drug therapy: Secondary | ICD-10-CM | POA: Insufficient documentation

## 2022-02-08 DIAGNOSIS — I350 Nonrheumatic aortic (valve) stenosis: Secondary | ICD-10-CM | POA: Diagnosis not present

## 2022-02-08 DIAGNOSIS — I2729 Other secondary pulmonary hypertension: Secondary | ICD-10-CM | POA: Insufficient documentation

## 2022-02-08 DIAGNOSIS — J432 Centrilobular emphysema: Secondary | ICD-10-CM | POA: Diagnosis not present

## 2022-02-08 DIAGNOSIS — I480 Paroxysmal atrial fibrillation: Secondary | ICD-10-CM | POA: Diagnosis not present

## 2022-02-08 DIAGNOSIS — Z951 Presence of aortocoronary bypass graft: Secondary | ICD-10-CM | POA: Insufficient documentation

## 2022-02-08 DIAGNOSIS — Z8679 Personal history of other diseases of the circulatory system: Secondary | ICD-10-CM | POA: Insufficient documentation

## 2022-02-08 LAB — BASIC METABOLIC PANEL
Anion gap: 12 (ref 5–15)
BUN: 25 mg/dL — ABNORMAL HIGH (ref 8–23)
CO2: 29 mmol/L (ref 22–32)
Calcium: 8.9 mg/dL (ref 8.9–10.3)
Chloride: 97 mmol/L — ABNORMAL LOW (ref 98–111)
Creatinine, Ser: 1.22 mg/dL (ref 0.61–1.24)
GFR, Estimated: 54 mL/min — ABNORMAL LOW (ref >60)
Glucose, Bld: 101 mg/dL — ABNORMAL HIGH (ref 70–99)
Potassium: 3.5 mmol/L (ref 3.5–5.1)
Sodium: 138 mmol/L (ref 135–145)

## 2022-02-08 MED ORDER — TORSEMIDE 20 MG PO TABS
ORAL_TABLET | ORAL | 3 refills | Status: DC
Start: 2022-02-08 — End: 2022-05-07

## 2022-02-08 NOTE — Telephone Encounter (Signed)
Please advise on the form to this patient. Per Aldona Bar at [facility] they do not have anything faxed back as of today.  ? I called the patient and let him know that we are working on the order. He wants to know if we can get an urgent order for the CPAP since it cut off on him this morning at 3 am and he was having trouble going back to sleep.  ? ? ?

## 2022-02-08 NOTE — Patient Instructions (Signed)
CHANGE Torsemide to 60 mg daily ALTERNATING with 40 mg daily ? ?Labs today ?We will only contact you if something comes back abnormal or we need to make some changes. ?Otherwise no news is good news! ? ? ?Keep cardiology follow up as scheduled with Dr Haroldine Laws ? ? ?Do the following things EVERYDAY: ?Weigh yourself in the morning before breakfast. Write it down and keep it in a log. ?Take your medicines as prescribed ?Eat low salt foods--Limit salt (sodium) to 2000 mg per day.  ?Stay as active as you can everyday ?Limit all fluids for the day to less than 2 liters ? ? ?At the [facility], you and your health needs are our priority. As part of our continuing mission to provide you with exceptional heart care, we have created designated Provider Care Teams. These Care Teams include your primary Cardiologist (physician) and Advanced Practice Providers (APPs- Physician Assistants and Nurse Practitioners) who all work together to provide you with the care you need, when you need it.  ? ?You may see any of the following providers on your designated Care Team at your next follow up: ?Dr Glori Bickers ?Dr Loralie Champagne ?Darrick Grinder, NP ?Lyda Jester, PA ?Jessica Milford,NP ?Marlyce Huge, PA ?Audry Riles, PharmD ? ? ?Please be sure to bring in all your medications bottles to every appointment.  ? ? ?If you have any questions or concerns before your next appointment please send [place] a message through [place] or call our office at [phone].   ? ?TO LEAVE A MESSAGE FOR THE NURSE SELECT OPTION 2, PLEASE LEAVE A MESSAGE INCLUDING: ?YOUR NAME ?DATE OF BIRTH ?CALL BACK NUMBER ?REASON FOR CALL**this is important as we prioritize the call backs ? ?YOU WILL RECEIVE A CALL BACK THE SAME DAY AS LONG AS YOU CALL BEFORE 4:00 PM ? ?

## 2022-02-08 NOTE — Telephone Encounter (Signed)
Completed form signed and dated by Dr. Vaughan Browner.  I called Vincent Robinson and he is aware form is being faxed to [phone].  Nothing further at this time. ?

## 2022-02-08 NOTE — Progress Notes (Signed)
ReDS Vest / Clip - 02/08/22 1300   ? ?  ? ReDS Vest / Clip  ? Station Marker C   ? Ruler Value 34   ? ReDS Value Range Low volume   ? ReDS Actual Value 28   ? ?  ?  ? ?  ? ? ?

## 2022-02-08 NOTE — Telephone Encounter (Signed)
See other encounter.

## 2022-02-09 ENCOUNTER — Telehealth: Payer: Self-pay | Admitting: Pulmonary Disease

## 2022-02-09 NOTE — Telephone Encounter (Signed)
Spoke with Vincent Robinson who states if Dr. Vaughan Browner would add addendum to [place]   note stating " Ordering Non-invasive Ventilator related to patient's Chronic Respiratory Failure secondary to COPD to reduce hospitalization and sustain life." This statement would allow pt's insurance to cover the coast of a NIV. Dr. Vaughan Browner please advise.  ?

## 2022-02-13 NOTE — Telephone Encounter (Signed)
Spoke with Vincent Robinson and notified him of amended note. Nothing further needed at this time.  ?

## 2022-02-13 NOTE — Telephone Encounter (Signed)
Ok done

## 2022-02-22 ENCOUNTER — Other Ambulatory Visit (HOSPITAL_COMMUNITY): Payer: Self-pay | Admitting: Internal Medicine

## 2022-02-28 ENCOUNTER — Telehealth: Payer: Self-pay | Admitting: Pulmonary Disease

## 2022-02-28 ENCOUNTER — Other Ambulatory Visit (HOSPITAL_COMMUNITY): Payer: Self-pay | Admitting: Internal Medicine

## 2022-02-28 DIAGNOSIS — G4734 Idiopathic sleep related nonobstructive alveolar hypoventilation: Secondary | ICD-10-CM

## 2022-02-28 NOTE — Telephone Encounter (Signed)
Please adivse if ok to place order for ONO on 2 liters?  ?

## 2022-02-28 NOTE — Telephone Encounter (Signed)
This patient had an order for an Astral, he does not want this. In order to get him a bipap we will need an Rx  for an overnight oximetry on 2 liters of o2 since he is on oxygen.  ? ?Thanks and have a great day! ? ?

## 2022-03-01 ENCOUNTER — Other Ambulatory Visit (HOSPITAL_COMMUNITY): Payer: Self-pay

## 2022-03-01 NOTE — Telephone Encounter (Signed)
ONO on 2L has been ordered.  ?

## 2022-03-01 NOTE — Telephone Encounter (Signed)
Ok to place order for ONO on 2 liters O2. ?

## 2022-03-02 ENCOUNTER — Other Ambulatory Visit: Payer: Self-pay | Admitting: *Deleted

## 2022-03-02 DIAGNOSIS — J849 Interstitial pulmonary disease, unspecified: Secondary | ICD-10-CM

## 2022-03-07 ENCOUNTER — Telehealth: Payer: Self-pay | Admitting: Pulmonary Disease

## 2022-03-07 DIAGNOSIS — G4734 Idiopathic sleep related nonobstructive alveolar hypoventilation: Secondary | ICD-10-CM

## 2022-03-07 DIAGNOSIS — G4733 Obstructive sleep apnea (adult) (pediatric): Secondary | ICD-10-CM

## 2022-03-08 NOTE — Telephone Encounter (Signed)
Called and spoke with patient who is asking for ONO results because he is trying to get a BIPAP machine. Advised patient that we will get the results and touch base with him once Dr. Vaughan Browner has looked at them. I called [facility] and requested to have the ONO results faxed to the office. [facility] states that they faxed over a CMN for the BIPAP I advised them that Dr. Vaughan Browner is not in the office until tomorrow so it would not be filled out till then. She expressed understanding. ONO results have been placed in Dr.Mannams red folder with the CMN and will be given to him tomorrow when he returns.  Dr. Vaughan Browner please advise on ONO results and if we can place order for patient to get BIPAP machine

## 2022-03-09 ENCOUNTER — Other Ambulatory Visit: Payer: Self-pay | Admitting: Radiology

## 2022-03-12 ENCOUNTER — Ambulatory Visit (HOSPITAL_COMMUNITY)
Admission: RE | Admit: 2022-03-12 | Discharge: 2022-03-12 | Disposition: A | Discharge status: Home / Self Care | Payer: Medicare Other | Source: Ambulatory Visit | Attending: Interventional Radiology | Admitting: Interventional Radiology

## 2022-03-12 ENCOUNTER — Telehealth: Payer: Self-pay | Admitting: Pulmonary Disease

## 2022-03-12 ENCOUNTER — Other Ambulatory Visit (HOSPITAL_COMMUNITY): Payer: Self-pay | Admitting: Interventional Radiology

## 2022-03-12 DIAGNOSIS — K819 Cholecystitis, unspecified: Secondary | ICD-10-CM

## 2022-03-12 DIAGNOSIS — Z434 Encounter for attention to other artificial openings of digestive tract: Secondary | ICD-10-CM | POA: Insufficient documentation

## 2022-03-12 HISTORY — PX: IR EXCHANGE BILIARY DRAIN: IMG6046

## 2022-03-12 MED ORDER — LIDOCAINE HCL 1 % IJ SOLN
INTRAMUSCULAR | Status: AC
  Filled 2022-03-12: qty 20

## 2022-03-12 MED ORDER — IOHEXOL 300 MG/ML  SOLN
100.0000 mL | Freq: Once | INTRAMUSCULAR | Status: AC | PRN
  Administered 2022-03-12: 5 mL

## 2022-03-12 MED ORDER — LIDOCAINE HCL (PF) 1 % IJ SOLN
INTRAMUSCULAR | Status: DC | PRN
  Administered 2022-03-12: 10 mL

## 2022-03-12 NOTE — Telephone Encounter (Signed)
Overnight oximetry dated 03/06/2022 Test performed on 3 L oxygen Duration of study 5 hours 9 minutes Time spent less than 88% 4 hours 10 minutes  Please let patient know that his oxygen test shows significantly low levels of saturation on just 3 L oxygen.  We are sending an order for BiPAP at night to be used with 3 L oxygen.

## 2022-03-12 NOTE — Telephone Encounter (Signed)
Called [facility] and they state they are just waiting on the CMN to be signed by Dr Vaughan Browner and then they can process the order. [facility] states that they will call an give the patient an update for [place]. Nothing further needed

## 2022-03-15 ENCOUNTER — Telehealth: Payer: Self-pay | Admitting: Pulmonary Disease

## 2022-03-15 NOTE — Telephone Encounter (Signed)
Called [facility] and spoke with Coralyn Mark. She states they sent over the CMN on 5/18 to be signed and have not received it yet. Spoke with Vallarie Mare, [facility], and was advised that the CMN was faxed back yesterday, 5/24. Called Terry back with [employer] and was advised they did not receive the CMN.   Vallarie Mare states the CMN was already sent to scan after getting a confirmation that the fax was sent successfully. ATC Terry back to have her directly fax it to [place] at [phone] so this can be taken care of asap. Coralyn Mark did not answer. Will call back after lunch time.

## 2022-03-15 NOTE — Telephone Encounter (Signed)
Order faxed back.

## 2022-03-15 NOTE — Telephone Encounter (Signed)
Called [facility] back and they will email the CMN directly to [facility] and to have Dr. Vaughan Browner sign this as soon as possible. Called and informed pt's daughter of the status, she verbalized understanding.   Will forward to [place] to update when this has been done. Thanks Vallarie Mare.

## 2022-03-15 NOTE — Telephone Encounter (Signed)
DME order placed and CMN received and signed by Dr. Karn Cassis, [facility] sent fax to [facility]. Please see 03/15/22.

## 2022-03-15 NOTE — Telephone Encounter (Signed)
Spoke with Coralyn Mark and verified that fax was received. Nothing further needed at this time.

## 2022-04-19 ENCOUNTER — Other Ambulatory Visit (HOSPITAL_COMMUNITY): Payer: Self-pay | Admitting: Family Medicine

## 2022-04-26 ENCOUNTER — Encounter (HOSPITAL_COMMUNITY): Payer: 59 | Admitting: Internal Medicine

## 2022-04-27 ENCOUNTER — Inpatient Hospital Stay (HOSPITAL_COMMUNITY)
Admission: EM | Admit: 2022-04-27 | Discharge: 2022-05-07 | DRG: 291 | Disposition: A | Discharge status: Skilled Nursing Facility | Payer: Medicare Other | Attending: Internal Medicine | Admitting: Internal Medicine

## 2022-04-27 ENCOUNTER — Encounter (HOSPITAL_COMMUNITY): Payer: Self-pay | Admitting: Radiology

## 2022-04-27 ENCOUNTER — Emergency Department (HOSPITAL_COMMUNITY): Payer: Medicare Other

## 2022-04-27 ENCOUNTER — Other Ambulatory Visit: Payer: Self-pay

## 2022-04-27 DIAGNOSIS — I714 Abdominal aortic aneurysm, without rupture, unspecified: Secondary | ICD-10-CM | POA: Diagnosis present

## 2022-04-27 DIAGNOSIS — N3001 Acute cystitis with hematuria: Secondary | ICD-10-CM

## 2022-04-27 DIAGNOSIS — Z823 Family history of stroke: Secondary | ICD-10-CM

## 2022-04-27 DIAGNOSIS — Z79899 Other long term (current) drug therapy: Secondary | ICD-10-CM | POA: Diagnosis not present

## 2022-04-27 DIAGNOSIS — J9621 Acute and chronic respiratory failure with hypoxia: Secondary | ICD-10-CM | POA: Diagnosis present

## 2022-04-27 DIAGNOSIS — I509 Heart failure, unspecified: Secondary | ICD-10-CM | POA: Insufficient documentation

## 2022-04-27 DIAGNOSIS — I4819 Other persistent atrial fibrillation: Secondary | ICD-10-CM | POA: Diagnosis present

## 2022-04-27 DIAGNOSIS — G4733 Obstructive sleep apnea (adult) (pediatric): Secondary | ICD-10-CM | POA: Diagnosis present

## 2022-04-27 DIAGNOSIS — W19XXXA Unspecified fall, initial encounter: Secondary | ICD-10-CM

## 2022-04-27 DIAGNOSIS — D72829 Elevated white blood cell count, unspecified: Secondary | ICD-10-CM

## 2022-04-27 DIAGNOSIS — I9589 Other hypotension: Secondary | ICD-10-CM | POA: Diagnosis not present

## 2022-04-27 DIAGNOSIS — I5033 Acute on chronic diastolic (congestive) heart failure: Secondary | ICD-10-CM | POA: Diagnosis present

## 2022-04-27 DIAGNOSIS — G4730 Sleep apnea, unspecified: Secondary | ICD-10-CM | POA: Diagnosis present

## 2022-04-27 DIAGNOSIS — I2510 Atherosclerotic heart disease of native coronary artery without angina pectoris: Secondary | ICD-10-CM | POA: Diagnosis present

## 2022-04-27 DIAGNOSIS — I2781 Cor pulmonale (chronic): Secondary | ICD-10-CM | POA: Diagnosis present

## 2022-04-27 DIAGNOSIS — I5082 Biventricular heart failure: Secondary | ICD-10-CM | POA: Diagnosis present

## 2022-04-27 DIAGNOSIS — Y92009 Unspecified place in unspecified non-institutional (private) residence as the place of occurrence of the external cause: Secondary | ICD-10-CM | POA: Diagnosis not present

## 2022-04-27 DIAGNOSIS — K811 Chronic cholecystitis: Secondary | ICD-10-CM | POA: Diagnosis present

## 2022-04-27 DIAGNOSIS — J189 Pneumonia, unspecified organism: Secondary | ICD-10-CM | POA: Diagnosis present

## 2022-04-27 DIAGNOSIS — I2721 Secondary pulmonary arterial hypertension: Secondary | ICD-10-CM | POA: Diagnosis present

## 2022-04-27 DIAGNOSIS — Z66 Do not resuscitate: Secondary | ICD-10-CM | POA: Diagnosis present

## 2022-04-27 DIAGNOSIS — I50813 Acute on chronic right heart failure: Secondary | ICD-10-CM | POA: Diagnosis not present

## 2022-04-27 DIAGNOSIS — E785 Hyperlipidemia, unspecified: Secondary | ICD-10-CM | POA: Diagnosis present

## 2022-04-27 DIAGNOSIS — Z87891 Personal history of nicotine dependence: Secondary | ICD-10-CM | POA: Diagnosis not present

## 2022-04-27 DIAGNOSIS — I3139 Other pericardial effusion (noninflammatory): Secondary | ICD-10-CM | POA: Diagnosis present

## 2022-04-27 DIAGNOSIS — E039 Hypothyroidism, unspecified: Secondary | ICD-10-CM | POA: Diagnosis present

## 2022-04-27 DIAGNOSIS — Z951 Presence of aortocoronary bypass graft: Secondary | ICD-10-CM | POA: Diagnosis not present

## 2022-04-27 DIAGNOSIS — R778 Other specified abnormalities of plasma proteins: Secondary | ICD-10-CM | POA: Diagnosis present

## 2022-04-27 DIAGNOSIS — N390 Urinary tract infection, site not specified: Secondary | ICD-10-CM | POA: Diagnosis present

## 2022-04-27 DIAGNOSIS — I4891 Unspecified atrial fibrillation: Secondary | ICD-10-CM | POA: Diagnosis present

## 2022-04-27 DIAGNOSIS — Z8551 Personal history of malignant neoplasm of bladder: Secondary | ICD-10-CM

## 2022-04-27 DIAGNOSIS — I252 Old myocardial infarction: Secondary | ICD-10-CM

## 2022-04-27 DIAGNOSIS — Z4682 Encounter for fitting and adjustment of non-vascular catheter: Secondary | ICD-10-CM

## 2022-04-27 DIAGNOSIS — E876 Hypokalemia: Secondary | ICD-10-CM | POA: Diagnosis present

## 2022-04-27 DIAGNOSIS — Y92002 Bathroom of unspecified non-institutional (private) residence single-family (private) house as the place of occurrence of the external cause: Secondary | ICD-10-CM | POA: Diagnosis not present

## 2022-04-27 DIAGNOSIS — I480 Paroxysmal atrial fibrillation: Secondary | ICD-10-CM

## 2022-04-27 DIAGNOSIS — J849 Interstitial pulmonary disease, unspecified: Secondary | ICD-10-CM | POA: Diagnosis not present

## 2022-04-27 DIAGNOSIS — I110 Hypertensive heart disease with heart failure: Principal | ICD-10-CM | POA: Diagnosis present

## 2022-04-27 DIAGNOSIS — I350 Nonrheumatic aortic (valve) stenosis: Secondary | ICD-10-CM | POA: Diagnosis present

## 2022-04-27 DIAGNOSIS — W010XXA Fall on same level from slipping, tripping and stumbling without subsequent striking against object, initial encounter: Secondary | ICD-10-CM | POA: Diagnosis present

## 2022-04-27 DIAGNOSIS — Z85828 Personal history of other malignant neoplasm of skin: Secondary | ICD-10-CM | POA: Diagnosis not present

## 2022-04-27 DIAGNOSIS — Z9981 Dependence on supplemental oxygen: Secondary | ICD-10-CM | POA: Diagnosis not present

## 2022-04-27 DIAGNOSIS — I4821 Permanent atrial fibrillation: Secondary | ICD-10-CM | POA: Diagnosis not present

## 2022-04-27 DIAGNOSIS — J449 Chronic obstructive pulmonary disease, unspecified: Secondary | ICD-10-CM | POA: Diagnosis not present

## 2022-04-27 DIAGNOSIS — I482 Chronic atrial fibrillation, unspecified: Secondary | ICD-10-CM | POA: Diagnosis not present

## 2022-04-27 DIAGNOSIS — Z79890 Hormone replacement therapy (postmenopausal): Secondary | ICD-10-CM

## 2022-04-27 DIAGNOSIS — I4510 Unspecified right bundle-branch block: Secondary | ICD-10-CM | POA: Diagnosis present

## 2022-04-27 DIAGNOSIS — I248 Other forms of acute ischemic heart disease: Secondary | ICD-10-CM | POA: Diagnosis present

## 2022-04-27 LAB — COMPREHENSIVE METABOLIC PANEL
ALT: 23 U/L (ref 0–44)
AST: 43 U/L — ABNORMAL HIGH (ref 15–41)
Albumin: 3.4 g/dL — ABNORMAL LOW (ref 3.5–5.0)
Alkaline Phosphatase: 70 U/L (ref 38–126)
Anion gap: 12 (ref 5–15)
BUN: 21 mg/dL (ref 8–23)
CO2: 27 mmol/L (ref 22–32)
Calcium: 8.6 mg/dL — ABNORMAL LOW (ref 8.9–10.3)
Chloride: 98 mmol/L (ref 98–111)
Creatinine, Ser: 1.07 mg/dL (ref 0.61–1.24)
GFR, Estimated: >60 mL/min (ref >60)
Glucose, Bld: 92 mg/dL (ref 70–99)
Potassium: 4.2 mmol/L (ref 3.5–5.1)
Sodium: 137 mmol/L (ref 135–145)
Total Bilirubin: 1.4 mg/dL — ABNORMAL HIGH (ref 0.3–1.2)
Total Protein: 8 g/dL (ref 6.5–8.1)

## 2022-04-27 LAB — I-STAT CHEM 8, ED
BUN: 30 mg/dL — ABNORMAL HIGH (ref 8–23)
Calcium, Ion: 1 mmol/L — ABNORMAL LOW (ref 1.15–1.40)
Chloride: 96 mmol/L — ABNORMAL LOW (ref 98–111)
Creatinine, Ser: 1 mg/dL (ref 0.61–1.24)
Glucose, Bld: 95 mg/dL (ref 70–99)
HCT: 43 % (ref 39.0–52.0)
Hemoglobin: 14.6 g/dL (ref 13.0–17.0)
Potassium: 5.1 mmol/L (ref 3.5–5.1)
Sodium: 135 mmol/L (ref 135–145)
TCO2: 31 mmol/L (ref 22–32)

## 2022-04-27 LAB — CBC WITH DIFFERENTIAL/PLATELET
Abs Immature Granulocytes: 0.15 10*3/uL — ABNORMAL HIGH (ref 0.00–0.07)
Basophils Absolute: 0.1 10*3/uL (ref 0.0–0.1)
Basophils Relative: 1 %
Eosinophils Absolute: 0.2 10*3/uL (ref 0.0–0.5)
Eosinophils Relative: 2 %
HCT: 40 % (ref 39.0–52.0)
Hemoglobin: 13.5 g/dL (ref 13.0–17.0)
Immature Granulocytes: 1 %
Lymphocytes Relative: 17 %
Lymphs Abs: 1.9 10*3/uL (ref 0.7–4.0)
MCH: 33.8 pg (ref 26.0–34.0)
MCHC: 33.8 g/dL (ref 30.0–36.0)
MCV: 100.3 fL — ABNORMAL HIGH (ref 80.0–100.0)
Monocytes Absolute: 1 10*3/uL (ref 0.1–1.0)
Monocytes Relative: 8 %
Neutro Abs: 8.2 10*3/uL — ABNORMAL HIGH (ref 1.7–7.7)
Neutrophils Relative %: 71 %
Platelets: 245 10*3/uL (ref 150–400)
RBC: 3.99 MIL/uL — ABNORMAL LOW (ref 4.22–5.81)
RDW: 14.3 % (ref 11.5–15.5)
WBC: 11.4 10*3/uL — ABNORMAL HIGH (ref 4.0–10.5)
nRBC: 0 % (ref 0.0–0.2)

## 2022-04-27 LAB — PROCALCITONIN: Procalcitonin: <0.1 ng/mL

## 2022-04-27 LAB — TROPONIN I (HIGH SENSITIVITY)
Troponin I (High Sensitivity): 27 ng/L — ABNORMAL HIGH (ref <18)
Troponin I (High Sensitivity): 30 ng/L — ABNORMAL HIGH (ref <18)
Troponin I (High Sensitivity): 37 ng/L — ABNORMAL HIGH (ref <18)

## 2022-04-27 LAB — URINALYSIS, ROUTINE W REFLEX MICROSCOPIC
Bilirubin Urine: NEGATIVE
Glucose, UA: NEGATIVE mg/dL
Ketones, ur: NEGATIVE mg/dL
Nitrite: NEGATIVE
Protein, ur: NEGATIVE mg/dL
Specific Gravity, Urine: 1.009 (ref 1.005–1.030)
WBC, UA: >50 WBC/hpf — ABNORMAL HIGH (ref 0–5)
pH: 7 (ref 5.0–8.0)

## 2022-04-27 LAB — TSH: TSH: 3.573 u[IU]/mL (ref 0.350–4.500)

## 2022-04-27 LAB — LIPASE, BLOOD: Lipase: 59 U/L — ABNORMAL HIGH (ref 11–51)

## 2022-04-27 LAB — MAGNESIUM: Magnesium: 2.1 mg/dL (ref 1.7–2.4)

## 2022-04-27 LAB — BRAIN NATRIURETIC PEPTIDE: B Natriuretic Peptide: 258.1 pg/mL — ABNORMAL HIGH (ref 0.0–100.0)

## 2022-04-27 MED ORDER — SODIUM CHLORIDE 0.9 % IV SOLN
1.0000 g | INTRAVENOUS | Status: DC
  Administered 2022-04-27 – 2022-05-03 (×7): 1 g via INTRAVENOUS
  Filled 2022-04-27 (×7): qty 10

## 2022-04-27 MED ORDER — SODIUM CHLORIDE 0.9% FLUSH
3.0000 mL | Freq: Two times a day (BID) | INTRAVENOUS | Status: DC
  Administered 2022-04-27 – 2022-05-07 (×18): 3 mL via INTRAVENOUS

## 2022-04-27 MED ORDER — FERROUS SULFATE 325 (65 FE) MG PO TABS
325.0000 mg | ORAL_TABLET | Freq: Every day | ORAL | Status: DC
  Administered 2022-04-28 – 2022-05-07 (×10): 325 mg via ORAL
  Filled 2022-04-27 (×10): qty 1

## 2022-04-27 MED ORDER — ENOXAPARIN SODIUM 40 MG/0.4ML IJ SOSY
40.0000 mg | PREFILLED_SYRINGE | INTRAMUSCULAR | Status: DC
  Administered 2022-04-28 – 2022-05-06 (×9): 40 mg via SUBCUTANEOUS
  Filled 2022-04-27 (×10): qty 0.4

## 2022-04-27 MED ORDER — SILDENAFIL CITRATE 20 MG PO TABS
40.0000 mg | ORAL_TABLET | Freq: Three times a day (TID) | ORAL | Status: DC
  Administered 2022-04-27 – 2022-05-07 (×28): 40 mg via ORAL
  Filled 2022-04-27 (×29): qty 2

## 2022-04-27 MED ORDER — ACETAMINOPHEN 325 MG PO TABS
650.0000 mg | ORAL_TABLET | Freq: Four times a day (QID) | ORAL | Status: DC | PRN
  Administered 2022-05-04 – 2022-05-06 (×4): 650 mg via ORAL
  Filled 2022-04-27 (×4): qty 2

## 2022-04-27 MED ORDER — ASPIRIN 81 MG PO CHEW
324.0000 mg | CHEWABLE_TABLET | Freq: Once | ORAL | Status: DC
  Filled 2022-04-27 (×2): qty 4

## 2022-04-27 MED ORDER — LEVOTHYROXINE SODIUM 88 MCG PO TABS
88.0000 ug | ORAL_TABLET | Freq: Every day | ORAL | Status: DC
  Administered 2022-04-28 – 2022-04-29 (×2): 88 ug via ORAL
  Filled 2022-04-27 (×2): qty 1

## 2022-04-27 MED ORDER — PANTOPRAZOLE SODIUM 20 MG PO TBEC
20.0000 mg | DELAYED_RELEASE_TABLET | Freq: Every morning | ORAL | Status: DC
  Administered 2022-04-28 – 2022-05-07 (×10): 20 mg via ORAL
  Filled 2022-04-27 (×10): qty 1

## 2022-04-27 MED ORDER — ACETAMINOPHEN 650 MG RE SUPP: 650.0000 mg | Freq: Four times a day (QID) | RECTAL | Status: DC | PRN

## 2022-04-27 MED ORDER — MIDODRINE HCL 5 MG PO TABS
15.0000 mg | ORAL_TABLET | Freq: Three times a day (TID) | ORAL | Status: DC
Start: 2022-04-27 — End: 2022-05-07
  Administered 2022-04-27 – 2022-05-07 (×30): 15 mg via ORAL
  Filled 2022-04-27 (×31): qty 3

## 2022-04-27 MED ORDER — MACITENTAN 10 MG PO TABS
10.0000 mg | ORAL_TABLET | Freq: Every day | ORAL | Status: DC
  Administered 2022-04-28 – 2022-05-07 (×10): 10 mg via ORAL
  Filled 2022-04-27 (×11): qty 1

## 2022-04-27 MED ORDER — LEVALBUTEROL HCL 1.25 MG/0.5ML IN NEBU: 1.2500 mg | INHALATION_SOLUTION | Freq: Four times a day (QID) | RESPIRATORY_TRACT | Status: DC | PRN

## 2022-04-27 MED ORDER — POTASSIUM CHLORIDE CRYS ER 10 MEQ PO TBCR
20.0000 meq | EXTENDED_RELEASE_TABLET | Freq: Every day | ORAL | Status: DC
  Administered 2022-04-28 – 2022-05-07 (×10): 20 meq via ORAL
  Filled 2022-04-27 (×15): qty 2

## 2022-04-27 MED ORDER — MONTELUKAST SODIUM 10 MG PO TABS
10.0000 mg | ORAL_TABLET | Freq: Every day | ORAL | Status: DC
  Administered 2022-04-27 – 2022-05-06 (×10): 10 mg via ORAL
  Filled 2022-04-27 (×10): qty 1

## 2022-04-27 MED ORDER — LIDOCAINE 5 % EX PTCH
1.0000 | MEDICATED_PATCH | CUTANEOUS | Status: DC
  Administered 2022-04-28 – 2022-05-06 (×9): 1 via TRANSDERMAL
  Filled 2022-04-27 (×9): qty 1

## 2022-04-27 MED ORDER — FUROSEMIDE 10 MG/ML IJ SOLN
120.0000 mg | Freq: Two times a day (BID) | INTRAVENOUS | Status: DC
  Administered 2022-04-27 – 2022-05-01 (×8): 120 mg via INTRAVENOUS
  Filled 2022-04-27 (×6): qty 10
  Filled 2022-04-27: qty 2
  Filled 2022-04-27 (×2): qty 12
  Filled 2022-04-27: qty 10
  Filled 2022-04-27: qty 12

## 2022-04-27 MED ORDER — SIMVASTATIN 20 MG PO TABS
10.0000 mg | ORAL_TABLET | Freq: Every day | ORAL | Status: DC
  Administered 2022-04-27 – 2022-05-06 (×10): 10 mg via ORAL
  Filled 2022-04-27 (×10): qty 1

## 2022-04-27 MED ORDER — FUROSEMIDE 10 MG/ML IJ SOLN
60.0000 mg | Freq: Once | INTRAMUSCULAR | Status: AC
  Administered 2022-04-27: 60 mg via INTRAVENOUS
  Filled 2022-04-27: qty 6

## 2022-04-27 MED ORDER — SODIUM CHLORIDE 0.9 % IV BOLUS
250.0000 mL | Freq: Once | INTRAVENOUS | Status: AC
  Administered 2022-04-27: 250 mL via INTRAVENOUS

## 2022-04-27 MED ORDER — MACITENTAN 10 MG PO TABS
10.0000 mg | ORAL_TABLET | Freq: Every day | ORAL | Status: DC
Start: 2022-04-27 — End: 2022-04-27

## 2022-04-27 NOTE — Consult Note (Addendum)
Cardiology Consultation:   Patient ID: Vincent Robinson MRN: [id number]; DOB: 1923/10/16  Admit date: 04/27/2022 Date of Consult: 04/27/2022  PCP:  Katherina Mires, MD   [facility] Providers Cardiologist:  Quay Burow, MD  Advanced Heart Failure:  Glori Bickers, MD  {  Patient Profile:   Vincent Robinson is a 88 y.o. male with a hx of CAD s/p CABG in 1998, AAA s/p EVAR 10/09, pulmonary hypertension with cor pulmonale, chronic hypoxic respiratory failure on 3 L oxygen during daytime and BiPAP at night due to COPD, symptomatic bradycardia (declined pacemaker), paroxysmal atrial fibrillation (not on anticoagulation due to prior GI bleed) , obstructive sleep apnea and aortic stenosis who is being seen 04/27/2022 for the evaluation of CHF at the request of Dr. Tamala Julian.  He had CABG in 1998. He had an endoluminal stent graft for abdominal aortic aneurysm in [place] 10/09 . Pre-op cath showed patent grafts.   Admitted 9/22 with A/CHF and A/C hypoxemic respiratory failure. Echo showed EF 60-65%, breathing improved after several doses of IV lasix. Hospitalization c/b dysphagia, GI consulted but deferred endoscopic eval due to age and comorbidities. Swallowing difficulties resolved by discharge, d/c weight 148 lbs.  Admitted 11/22 with symptomatic bradycardia, HR in 30's. He was hemodynamically stable and HR improved to 50's. There was discussion about EP consult for PPM but he declined this. He was continued on midodrine. PT rec SNF but he declined, discharged back to assisted living facility, weight 147 lbs.   [place] 12/22 Predominant SR Rates 37-197 (avg 73). 3 short runs NSVT. ? Two brief episodes of HR in 30s due to CHB vs Wenckebach (hard to discern p waves)  Patient has chronic hypotension and requires midodrine 15 mg 3 times daily.   History of Present Illness:   Mr. Brannock had a mechanical fall last night around 11 PM when trying to reach something in the bathroom.  EMS was called and he was  found hypoxic at 78%.  He was in atrial fibrillation.  No loss of consciousness.  Reports chronic dyspnea.  Compliant with his BiPAP.  Recently noted dry cough.  Chest x-ray concerning for pulmonary edema versus infection.  Urine analysis with possible UTI.  He is given IV Lasix 60 mg and admitted for further evaluation.  He required BiPAP now on high flow nasal cannula at 15 L.  Had some chest tightness.  High-sensitivity troponin 27>> 30>> 37 Albumin 3.4 Creatinine 1 Potassium 4.2>> 5.1 Procalcitonin normal TSH normal BNP 258  Past Medical History:  Diagnosis Date   AAA (abdominal aortic aneurysm) (HCC)    Acute respiratory failure ([place]) 11/23/2019   Asthma    Basal cell carcinoma    CHF (congestive heart failure) (HCC)    Chronic respiratory failure (HCC)    COPD (chronic obstructive pulmonary disease) ([place])    Coronary artery disease    Diverticulitis    Dysrhythmia    afib   History of chronic respiratory failure    Hyperlipidemia    Hypothyroidism    Moderate aortic stenosis    Myocardial infarction ([place])    Pneumonia    Pulmonary HTN ([place])    Right heart failure ([place])    Skin cancer 2017   bladder    Sleep apnea    Bipap    Past Surgical History:  Procedure Laterality Date   bleeding ulcer  2016   BYPASS AXILLA/BRACHIAL ARTERY  1998   colon bleed  2012   COLONOSCOPY N/A 06/07/2017  Procedure: COLONOSCOPY;  Surgeon: Carol Ada, MD;  Location: [facility];  Service: Endoscopy;  Laterality: N/A;   CORONARY ARTERY BYPASS GRAFT     5 vessel   DEBRIDMENT OF DECUBITUS ULCER  2002   EYE SURGERY     cataract   IR ANGIOGRAM SELECTIVE EACH ADDITIONAL VESSEL  03/22/2020   IR ANGIOGRAM VISCERAL SELECTIVE  03/22/2020   IR EXCHANGE BILIARY DRAIN  12/02/2020   IR EXCHANGE BILIARY DRAIN  02/08/2021   IR EXCHANGE BILIARY DRAIN  04/05/2021   IR EXCHANGE BILIARY DRAIN  06/05/2021   IR EXCHANGE BILIARY DRAIN  07/31/2021   IR EXCHANGE BILIARY DRAIN  09/25/2021   IR EXCHANGE  BILIARY DRAIN  11/20/2021   IR EXCHANGE BILIARY DRAIN  01/15/2022   IR EXCHANGE BILIARY DRAIN  03/12/2022   IR PERC CHOLECYSTOSTOMY  09/08/2020   IR RADIOLOGIST EVAL & MGMT  10/06/2020   IR RADIOLOGIST EVAL & MGMT  10/26/2020   IR US GUIDE VASC ACCESS RIGHT  03/22/2020   RIGHT HEART CATH N/A 12/11/2017   Procedure: RIGHT HEART CATH;  Surgeon: Jolaine Artist, MD;  Location: [facility];  Service: Cardiovascular;  Laterality: N/A;   THORACIC AORTA STENT  2009   TRANSURETHRAL RESECTION OF BLADDER TUMOR WITH MITOMYCIN-C N/A 03/09/2021   Procedure: TRANSURETHRAL RESECTION OF BLADDER TUMOR;  Surgeon: Franchot Gallo, MD;  Location: [facility];  Service: Urology;  Laterality: N/A;      Inpatient Medications: Scheduled Meds:  aspirin  324 mg Oral Once   enoxaparin (LOVENOX) injection  40 mg Subcutaneous Q24H   lidocaine  1 patch Transdermal Q24H   midodrine  15 mg Oral TID with meals   sodium chloride flush  3 mL Intravenous Q12H   Continuous Infusions:  cefTRIAXone (ROCEPHIN)  IV Stopped (04/27/22 0902)   PRN Meds: acetaminophen **OR** acetaminophen, levalbuterol  Allergies:    Allergies  Allergen Reactions   Oxycodone     Delusions (intolerance), Hallucinations    Hydrocodone     Daughter describes his reaction as "being out of it, unable to tolerate it, unable function"    Social History:   Social History   Socioeconomic History   Marital status: Widowed    Spouse name: Not on file   Number of children: Not on file   Years of education: Not on file   Highest education level: Not on file  Occupational History   Not on file  Tobacco Use   Smoking status: Former    Packs/day: 0.50    Types: Cigarettes    Quit date: 08/06/1961    Years since quitting: 60.7   Smokeless tobacco: Never  Vaping Use   Vaping Use: Never used  Substance and Sexual Activity   Alcohol use: Not Currently   Drug use: Never   Sexual activity: Not Currently  Other Topics Concern   Not on  file  Social History Narrative   Not on file   Social Determinants of Health   Financial Resource Strain: Not on file  Food Insecurity: Not on file  Transportation Needs: Not on file  Physical Activity: Not on file  Stress: Not on file  Social Connections: Not on file  Intimate Partner Violence: Not on file    Family History:    Family History  Problem Relation Age of Onset   Emphysema Mother    Stroke Father    Movement disorder Sister    Aneurysm Brother        Thoracic aorta  Macular degeneration Brother      ROS:  Please see the history of present illness.  All other ROS reviewed and negative.     Physical Exam/Data:   Vitals:   04/27/22 1200 04/27/22 1301 04/27/22 1604 04/27/22 1622  BP: (!) 93/59 116/78  (!) 93/48  Pulse: 65 79  71  Resp: (!) _0 Temp:  97.6 F (36.4 C)  98.2 F (36.8 C)  TempSrc:  Axillary  Oral  SpO2: 93% 99% (!) 88% 97%  Weight:      Height:        Intake/Output Summary (Last 24 hours) at 04/27/2022 1716 Last data filed at 04/27/2022 1624 Gross per 24 hour  Intake --  Output 250 ml  Net -250 ml      04/27/2022    1:44 AM 02/08/2022    1:40 PM 01/22/2022    3:27 PM  Last 3 Weights  Weight (lbs) 146 lb 146 lb 6.4 oz 150 lb  Weight (kg) 66.225 kg 66.407 kg 68.04 kg     Body mass index is 22.87 kg/m.  General: Ill-appearing elderly male in no acute distress but on high flow oxygen HEENT: normal Neck:+ JVD Vascular: No carotid bruits; Distal pulses 2+ bilaterally Cardiac:  normal S1, S2; irregularly irregular; + murmur Lungs: Faint Rales  abd: soft, nontender, no hepatomegaly  Ext: no edema Musculoskeletal:  No deformities, BUE and BLE strength normal and equal Skin: warm and dry  Neuro:  CNs 2-12 intact, no focal abnormalities noted Psych:  Normal affect   EKG:  The EKG was personally reviewed and demonstrates: Atrial fibrillation Telemetry:  Telemetry was personally reviewed and demonstrates: Atrial  fibrillation  Relevant CV Studies:  Monitor 09/2021 Patch Wear Time:  13 days and 16 hours (2022-11-28T12:58:06-499 to 2022-12-12T05:04:17-0500)   Patient had a min HR of 37 bpm, max HR of 197 bpm, and avg HR of 73 bpm. Predominant underlying rhythm was Sinus Rhythm. First Degree AV Block was present. 3 Ventricular Tachycardia runs occurred, the run with the fastest interval lasting 17 beats with a  max rate of 197 bpm (avg 146 bpm); the run with the fastest interval was also the longest. 2 episode(s) of AV Block (3rd) occurred, lasting a total of 17 secs. Isolated SVEs were occasional (4.0%, 57267), SVE Couplets were occasional (2.8%, 19907),  and SVE Triplets were occasional (2.0%, 9433). Isolated VEs were occasional (1.8%, 25195), VE Couplets were rare (<1.0%, 417), and VE Triplets were rare (<1.0%, 8). Ventricular Bigeminy and Trigeminy were present. Difficulty discerning atrial activity  making definitive diagnosis difficult to ascertain. MD notification criteria for Complete Heart Block met - report posted prior to notification per account request (JC).   Conclusion:  1. Predominantly NSR 2. 3 short NSVT runs.  3. Occasional PACs and PVCs. 4. Two episodes with HR in 30s recorded as complete heart block, but P waves difficult to discern (one may actually be type 1 2nd degree AVB).   Echo 06/2021 1. Left ventricular ejection fraction, by estimation, is 60 to 65%. The  left ventricle has normal function. The left ventricle has no regional  wall motion abnormalities. There is moderate concentric left ventricular  hypertrophy. Left ventricular  diastolic function could not be evaluated. There is the interventricular  septum is flattened in systole and diastole, consistent with right  ventricular pressure and volume overload.   2. Right ventricular systolic function is severely reduced. The right  ventricular size is severely enlarged. There is  severely elevated  pulmonary artery  systolic pressure. The estimated right ventricular  systolic pressure is 96.7 mmHg.   3. Left atrial size was severely dilated.   4. Right atrial size was severely dilated.   5. A small pericardial effusion is present. The pericardial effusion is  posterior to the left ventricle.   6. The mitral valve is degenerative. Mild to moderate mitral valve  regurgitation. No evidence of mitral stenosis.   7. Tricuspid valve regurgitation is moderate to severe.   8. Moderate aortic stenosis. Vmax 3.6 m/s, MG 30.2 mmHG, AVA 1.11 cm2, DI  0.32. All consistent with moderate AS. The aortic valve is tricuspid.  There is severe calcifcation of the aortic valve. There is severe  thickening of the aortic valve. Aortic  valve regurgitation is mild. Moderate aortic valve stenosis.   9. There is mild dilatation of the ascending aorta, measuring 42 mm.  10. The inferior vena cava is dilated in size with <50% respiratory  variability, suggesting right atrial pressure of 15 mmHg.   Comparison(s): No significant change from prior study.   Laboratory Data:  High Sensitivity Troponin:   Recent Labs  Lab 04/27/22 0140 04/27/22 0437 04/27/22 1309  TROPONINIHS 27* 30* 37*     Chemistry Recent Labs  Lab 04/27/22 0140 04/27/22 0147  NA 137 135  K 4.2 5.1  CL 98 96*  CO2 27  --   GLUCOSE 92 95  BUN 21 30*  CREATININE 1.07 1.00  CALCIUM 8.6*  --   MG 2.1  --   GFRNONAA >60  --   ANIONGAP 12  --     Recent Labs  Lab 04/27/22 0140  PROT 8.0  ALBUMIN 3.4*  AST 43*  ALT 23  ALKPHOS 70  BILITOT 1.4*    Hematology Recent Labs  Lab 04/27/22 0140 04/27/22 0147  WBC 11.4*  --   RBC 3.99*  --   HGB 13.5 14.6  HCT 40.0 43.0  MCV 100.3*  --   MCH 33.8  --   MCHC 33.8  --   RDW 14.3  --   PLT 245  --    Thyroid  Recent Labs  Lab 04/27/22 0140  TSH 3.573    BNP Recent Labs  Lab 04/27/22 0140  BNP 258.1*   Radiology/Studies:  DG Pelvis 1-2 Views  Result Date: 04/27/2022 CLINICAL  DATA:  Fall, possible fracture. EXAM: PELVIS - 1-2 VIEW COMPARISON:  None Available. FINDINGS: There is no evidence of acute pelvic fracture, dislocation, or diastasis. Degenerative changes are noted in the lower lumbar spine and bilateral hips. Vascular calcifications are present in the lower extremities. Vascular stents are noted in the abdomen. IMPRESSION: No acute fracture or dislocation. Electronically Signed   By: Brett Fairy M.D.   On: 04/27/2022 02:43   DG Chest Portable 1 View  Result Date: 04/27/2022 CLINICAL DATA:  Fall, back pain. EXAM: PORTABLE CHEST 1 VIEW COMPARISON:  09/04/2021 FINDINGS: The heart is enlarged the mediastinal contour is stable. Atherosclerotic calcification of the aorta is noted. The pulmonary vasculature is distended. Patchy airspace disease is noted in the lungs bilaterally. No effusion or pneumothorax. Sternotomy wires are present over the midline. No acute osseous abnormality. IMPRESSION: 1. Cardiomegaly with pulmonary vascular congestion. 2. Patchy opacities in the lungs bilaterally, possible edema or infiltrate. Electronically Signed   By: Brett Fairy M.D.   On: 04/27/2022 02:41     Assessment and Plan:   Acute on chronic respiratory failure secondary to CHF  exacerbation +/- infection -Patient with chronic hypoxic respiratory failure requiring 3 L oxygen during daytime and BiPAP at night.  He was found hypoxic after fall by EMS.  Checks x-ray cardiomegaly with pulmonary vascular congestion with possible edema versus infiltrate. -BNP minimally elevated at 252 -Initially required BiPAP now weaned off and high flow nasal cannula @ 15 L -Got IV Lasix 60 mg this morning -Only I N O of -250. ?  Accurate -Reevaluate need of diuresis -Also on Rocephin   2.  Paroxysmal atrial fibrillation -Patient noted in atrial fibrillation at rate control -Not on any rate control agent due to prior history of symptomatic bradycardia (declined pacemaker at that time) -Not on  chronic anticoagulation due history of GI bleed  3.  Aortic stenosis -Moderate by last echocardiogram September 2022 with mean gradient of 30.2 mmHg  4.  Elevated troponin -High-sensitivity troponin 27>> 30>> 37 -Likely demand ischemia  5.  Possible UTI -On Rocephin empirically  Risk Assessment/Risk Scores:   New York Heart Association (NYHA) Functional Class NYHA Class III  CHA2DS2-VASc Score = 4   This indicates a 4.8% annual risk of stroke. The patient's score is based upon: CHF History: 1 HTN History: 0 Diabetes History: 0 Stroke History: 0 Vascular Disease History: 1 Age Score: 2 Gender Score: 0  For questions or updates, please contact [facility] Please consult [email or web address] for contact info under    Jarrett Soho, PA  04/27/2022 5:16 PM   Personally seen and examined. Agree with APP above with the following comments:  Briefly 88 yo M with a history of CAD s/p CABG, AAA s/p EVAR, OSA on BIPAP not CPAP, PAF (no AC with prior GI bleed), PAH and PH with cor Pulmonae with ILD with history of hypotension requiring midodrine, symptomatic bradycardia with declined PPM in the past, who presents after fall.  Patient notes that he has been dealing with a lot after Apr 25, 2023; his wife died of cancer this past month.  He notes that that for the past two weeks has had worsening SOB and has ben on higher amounts of oxygen. Had mechanical fall (remembers fall, was trying to pick something up), when he fell.  Exam notable for RV heave, notable JVD, bilateral chronic, IRIR rhythm, non pitting edema, and holosystolic murmur Cardiac imaging notable for moderate AS, mild AI, severe TR, from 07/13/22 imaging  Labs notable for BNP 258 EKG AF with occasional PVCs Tele: AF rate controlled Would recommend - continue sildenafil and macitentan for APH; Liver enzymes slightly elevated bit not consistent with ERA toxicity - Lasix 120 mg IV BID; hypoxia may be multi-factorial goal  would be to return to 3 L - continue midodrine; would not tolerate epoprostenol - DNR but not other barriers to care; continue Statin on ASA no AC for prior bleeds - will follow echo; he has not wanted aggressive cardiac procedures due to age and other comorbidities, do not plan to pursue AS interventions  Rudean Haskell, MD [facility]  [address] [place], [place] [address] [phone]  5:54 PM

## 2022-04-27 NOTE — Progress Notes (Signed)
Pharmacy Consult for Pulmonary Hypertension Treatment   Indication - Continuation of prior to admission medication   Patient is 88 y.o.  with history of PAH on chronic Macitentan (Opsumit) PTA and will be continued while hospitalized.   Continuing this medication order as an inpatient requires that monitoring parameters per REMS requirements must be met.  Chronic therapy is under the supervision of Dr. Haroldine Laws who is enrolled in the REMS program and is being notified of continuation of therapy. A staff message in EPIC has been sent notifying the certified prescriber.  Per patient report has previously been educated on Hepatotoxicity . On admission pregnancy risk has been assessed and no monitoring required. Hepatic function has been evaluated. AST/ALT appropriate to continue medication at this time.     Latest Ref Rng & Units 04/27/2022    1:40 AM 10/24/2021    4:25 PM 09/05/2021   11:29 AM  Hepatic Function  Total Protein 6.5 - 8.1 g/dL 8.0  7.6  6.9   Albumin 3.5 - 5.0 g/dL 3.4  3.3  3.1   AST 15 - 41 U/L 43  50  54   ALT 0 - 44 U/L 23  37  25   Alk Phosphatase 38 - 126 U/L 70  101  102   Total Bilirubin 0.3 - 1.2 mg/dL 1.4  1.3  1.3   Bilirubin, Direct 0.0 - 0.2 mg/dL   0.5     If any question arise or pregnancy is identified during hospitalization, contact for bosentan and macitentan: [phone]; ambrisentan: [phone].  Thank for you allowing [place] to participate in the care of this patient.   Sherlon Handing, PharmD, BCPS Please see amion for complete clinical pharmacist phone list 04/27/2022 6:10 PM

## 2022-04-27 NOTE — Progress Notes (Signed)
PT placed on bipap for the night. 

## 2022-04-27 NOTE — Progress Notes (Signed)
Pt was taken off of BIPAP per pt's request. Pt is not in any respiratory distress, has clear/diminished BS bilterally, and RR low 20s. RT placed pt on 6 L [place] initially and then gradually went up to 15 L [place]. Goal for saturations is 88 > per MD. Pt claims that he normally saturates 87-94% throughout the day. Pt is currently 91% on 15 L. Rt will monitor.

## 2022-04-27 NOTE — ED Triage Notes (Signed)
Pt was standing in the bathroom and slipped and fell. Pt was found to be in afib when ems got to him as well as a low oxygen level of 78% even though he is on 4l o2. Pt complaining of back pain.

## 2022-04-27 NOTE — ED Notes (Signed)
MD made aware of pts BP running soft.

## 2022-04-27 NOTE — H&P (Addendum)
History and Physical    Patient: Vincent Robinson ZOX:[id number] DOB: 1922/10/25 DOA: 04/27/2022 DOS: the patient was seen and examined on 04/27/2022 PCP: Katherina Mires, MD  Patient coming from: Home via EMS  Chief Complaint:  Chief Complaint  Patient presents with   Fall   Shortness of Breath   HPI: Vincent Robinson is a 88 y.o. male with medical history significant of   diastolic CHF, CAD  s/p CABG, paroxysmal atrial fibrillation, chronic hypotension on midodrine, pulmonary hypertension with cor pulmonale, interstitial lung disease,  COPD, chronic respiratory failure on 3 L oxygen at baseline, hypothyroidism, AAA s/p endovascular repair, bladder cancer who wears chronic condom cath, and OSA on BiPAP who presents after fall.  Patient reports living at home alone and gets around mostly with use of a walker, but also has a scooter with which he can use.  Last night he had placed his leg up on the toilet seat while trying to empty his condom catheter.  However, while trying to put his foot down lost his balance causing him to fall.  Denies any loss of consciousness, but does report significant pain in his tailbone region.  He was unable to get up for few hours.  Patient reports that since the incident he had felt more short of breath and has had some chest tightness, but has had an intermittent cough for few days that is productive.  He chronically has leg swelling that he reports is not necessarily changed.  He has not recently been checking his weight on a regular basis, but felt like he had been doing well otherwise.  Denies having any recent fevers, nausea, vomiting, or chest pain symptoms.   On admission patient was noted to be afebrile, blood pressures as low as 93/50-117/59, respirations 15-38, and O2 saturations currently maintained on BiPAP.  Labs significant for WBC 11.4, lipase 59, AST 43, total bilirubin 1.4, BNP 258.1, and high-sensitivity troponin 27->30.  Chest x-ray noted cardiomegaly with  pulmonary vascular congestion and patchy opacities in both lungs concerning for possible edema versus infiltrate.  X-ray of the pelvis did not note any acute fracture or dislocation.  Urinalysis revealed small hemoglobin, large leukocytes, few bacteria, and greater than 50 WBCs.  Patient had been given 60 mg of Lasix IV and admitted ordered full dose aspirin.  Patient noted having some chest tightness with nausea temporarily.   Review of Systems: As mentioned in the history of present illness. All other systems reviewed and are negative. Past Medical History:  Diagnosis Date   AAA (abdominal aortic aneurysm) ([place])    Acute respiratory failure ([place]) 11/23/2019   Asthma    Basal cell carcinoma    CHF (congestive heart failure) ([facility])    Chronic respiratory failure ([facility])    COPD (chronic obstructive pulmonary disease) ([place])    Coronary artery disease    Diverticulitis    Dysrhythmia    afib   History of chronic respiratory failure    Hyperlipidemia    Hypothyroidism    Moderate aortic stenosis    Myocardial infarction ([place])    Pneumonia    Pulmonary HTN ([place])    Right heart failure ([place])    Skin cancer 2017   bladder    Sleep apnea    Bipap   Past Surgical History:  Procedure Laterality Date   bleeding ulcer  2016   BYPASS AXILLA/BRACHIAL ARTERY  1998   colon bleed  2012   COLONOSCOPY N/A 06/07/2017   Procedure: COLONOSCOPY;  Surgeon: Carol Ada, MD;  Location: [facility];  Service: Endoscopy;  Laterality: N/A;   CORONARY ARTERY BYPASS GRAFT     5 vessel   DEBRIDMENT OF DECUBITUS ULCER  2002   EYE SURGERY     cataract   IR ANGIOGRAM SELECTIVE EACH ADDITIONAL VESSEL  03/22/2020   IR ANGIOGRAM VISCERAL SELECTIVE  03/22/2020   IR EXCHANGE BILIARY DRAIN  12/02/2020   IR EXCHANGE BILIARY DRAIN  02/08/2021   IR EXCHANGE BILIARY DRAIN  04/05/2021   IR EXCHANGE BILIARY DRAIN  06/05/2021   IR EXCHANGE BILIARY DRAIN  07/31/2021   IR EXCHANGE BILIARY DRAIN  09/25/2021   IR EXCHANGE  BILIARY DRAIN  11/20/2021   IR EXCHANGE BILIARY DRAIN  01/15/2022   IR EXCHANGE BILIARY DRAIN  03/12/2022   IR PERC CHOLECYSTOSTOMY  09/08/2020   IR RADIOLOGIST EVAL & MGMT  10/06/2020   IR RADIOLOGIST EVAL & MGMT  10/26/2020   IR US GUIDE VASC ACCESS RIGHT  03/22/2020   RIGHT HEART CATH N/A 12/11/2017   Procedure: RIGHT HEART CATH;  Surgeon: Jolaine Artist, MD;  Location: [facility];  Service: Cardiovascular;  Laterality: N/A;   THORACIC AORTA STENT  2009   TRANSURETHRAL RESECTION OF BLADDER TUMOR WITH MITOMYCIN-C N/A 03/09/2021   Procedure: TRANSURETHRAL RESECTION OF BLADDER TUMOR;  Surgeon: Franchot Gallo, MD;  Location: [facility];  Service: Urology;  Laterality: N/A;   Social History:  reports that he quit smoking about 60 years ago. His smoking use included cigarettes. He smoked an average of .5 packs per day. He has never used smokeless tobacco. He reports that he does not currently use alcohol. He reports that he does not use drugs.  Allergies  Allergen Reactions   Oxycodone     Other reaction(s): Delusions (intolerance), Hallucinations Wiped him out Wiped him out    Hydrocodone Other (See Comments)    Family History  Problem Relation Age of Onset   Emphysema Mother    Stroke Father    Movement disorder Sister    Aneurysm Brother        Thoracic aorta   Macular degeneration Brother     Prior to Admission medications   Medication Sig Start Date End Date Taking? Authorizing Provider  albuterol (VENTOLIN HFA) 108 (90 Base) MCG/ACT inhaler Inhale 1 puff into the lungs every 6 (six) hours as needed for wheezing or shortness of breath.    [provider]  ferrous sulfate 325 (65 FE) MG tablet Take 325 mg by mouth daily with breakfast.    [provider]  levothyroxine (SYNTHROID) 88 MCG tablet Take 88 mcg by mouth daily before breakfast.    [provider]  midodrine (PROAMATINE) 10 MG tablet TAKE ONE TABLET BY MOUTH THREE TIMES A DAY AFTER  MEALS - TAKE IN ADDITION TO '5MG'$  TABLETS TO EQUAL '15MG'$  THREE TIMES A DAY 02/22/22   Bensimhon, Shaune Pascal, MD  midodrine (PROAMATINE) 5 MG tablet TAKE ONE TABLET BY MOUTH THREE TIMES A DAY WITH A MEAL -TAKE IN ADDITION TO '10MG'$  TABLETS TO EQUAL '15MG'$  THREE TIMES A DAY 01/29/22   Bensimhon, Shaune Pascal, MD  montelukast (SINGULAIR) 10 MG tablet Take 10 mg by mouth at bedtime.    [provider]  OPSUMIT 10 MG tablet TAKE 1 TABLET BY MOUTH ONCE DAILY 03/02/22   Bensimhon, Shaune Pascal, MD  pantoprazole (PROTONIX) 20 MG tablet Take 20 mg by mouth every morning.    [provider]  potassium chloride (KLOR-CON) 10  MEQ tablet Take 20 mEq by mouth daily.    [provider]  sildenafil (REVATIO) 20 MG tablet TAKE TWO TABLETS BY MOUTH THREE TIMES A DAY 04/19/22   Bensimhon, Shaune Pascal, MD  simvastatin (ZOCOR) 10 MG tablet Take 10 mg by mouth at bedtime.    [provider]  Sodium Chloride Flush (NORMAL SALINE FLUSH) 0.9 % SOLN Use as directed 12/04/21   Sandi Mariscal, MD  torsemide (DEMADEX) 20 MG tablet Take 3 tablets (60 mg total) by mouth daily AND 2 tablets (40 mg total) daily. ALTERNATING. 02/08/22   Rafael Bihari, FNP    Physical Exam: Vitals:   04/27/22 0810 04/27/22 0930 04/27/22 0940 04/27/22 0950  BP: (!) 94/54 (!) 88/55 (!) 94/49 (!) 92/52  Pulse: (!) 56 74 74 (!) 46  Resp: (!) '24 16 19 15  '$ Temp:      TempSrc:      SpO2: 94% 95% 94% 95%  Weight:      Height:        Constitutional: Elderly male who appears to be tolerating BiPAP Eyes: PERRL, lids and conjunctivae normal ENMT: Mucous membranes are moist.  Some discoloration noted of patient's nose. Neck: normal, supple, JVD present. Respiratory: Normal respiratory effort currently on BiPAP with rales appreciated.  Able to talk in shortened sentences Cardiovascular: Irregular irregular with positive systolic murmur appreciated.  Positive bilateral lower extremity edema obese 1+ Abdomen: no tenderness, no masses  palpated. No hepatosplenomegaly. Bowel sounds positive.  Musculoskeletal: no clubbing / cyanosis.  No focal joint deformity appreciated. Skin: no rashes, lesions, ulcers. No induration Neurologic: CN 2-12 grossly intact.  Able to move all extremity Psychiatric: Normal judgment and insight. Alert and oriented x 3. Normal mood.   Data Reviewed:  EKG revealed atrial fibrillation 87 bpm with PVC, RBBB, and LPFB.  QTc 486.  Reviewed labs and imaging, and pertinent records as noted above in HPI   Assessment and Plan: Acute on chronic respiratory failure with hypoxia secondary to chronic diastolic CHF Patient was noted to have O2 saturations of 78% on 4 L of nasal cannula oxygen when found by EMS. Normally O2 saturation maintained on 3 L.  Patient was noted to have lower extremity edema and JVD on physical exam.  Labs revealed BNP 258.1 and chest x-ray showing cardiomegaly with bilateral pulmonary infiltrates concerning for possible edema versus infection.  Last echocardiogram revealed EF 60 -76% with diastolic parameters unable to be evaluated in 06/2021.  He normally alternates taking torsemide 60 mg and 40 mg daily.  Patient had been given 60 mg of Lasix IV in the ED. Suspect patient is likely fluid overloaded and less likely infectious in nature.    -Admit to a progressive bed -Continuous pulse oximetry with oxygen maintain O2 saturation greater than 90% -Continue BiPAP and wean off once able -Check procalcitonin as infiltrates could possibly be infection -Lasix IV per cardiology -Cardiology consulted as follow-up by Dr. Alvester Chou and Dr. Haroldine Laws.  Pulmonary artery hypertension with cor pulmonale -Continue sildenafil and macitentan  COPD, without acute exacerbation Interstitial lung disease, mild Follows in outpatient setting by Dr. Vaughan Browner neurology.Prior CTs  showed calcified right upper lobe scarring with mild interstitial lung disease been stable from 2016-2019.  Most recent CT from 01/2022  noted right lower lobe nodule possibly secondary to fibrosis. -Breathing treatments as needed  Chronic hypotension On admission blood pressures noted to be as low as 93/50.  Patient has chronic hypotension for which she appears to be on midodrine  15 mg 3 times daily. -Bolused 250 mL IV fluid. -Continue midodrine as soon as possible  Possible urinary tract infection Acute.  Urinalysis noted large leukocytes, few bacteria, and greater than 50 WBCs.  Question possibly underlying infection versus asymptomatic bacteriuria. -Check urine culture -Start empiric antibiotics of Rocephin  Leukocytosis Acute.  WBC elevated at 11.4. -Recheck CBC tomorrow  Elevated troponin Acute on chronic.  High-sensitivity troponin 27->30.  Patient did report having some chest tightness with some mild nausea.  Otherwise appears flat.  Suspect secondary to demand in setting of CHF. -Recheck another troponin.  Paroxysmal atrial fibrillation Patient found to be in atrial fibrillation, but currently appears to be rate controlled.  Records note not on anticoagulation due to history of GI bleeding.  Fall Patient reports losing his balance after trying to put his foot down from the toilet seat while he was changing his Foley bag.  Complains of pain in his tailbone after the fall.  X-rays of the pelvis did not note any acute abnormality. -Lidocaine patch -PT/OT to eval and treat  Elevated lipase Lipase elevated at 54.  Patient did not report any significant complaints of abdominal pain.  Hypothyroidism -Check TSH -Continue levothyroxine once med reconciliation complete  AAA S/p endovascular stent repair in 2009  OSA -Continue BiPAP nightly  Advance Care Planning:   Code Status: DNR   Consults: Cardiology  Family Communication: none  Severity of Illness: The appropriate patient status for this patient is INPATIENT. Inpatient status is judged to be reasonable and necessary in order to provide the required  intensity of service to ensure the patient's safety. The patient's presenting symptoms, physical exam findings, and initial radiographic and laboratory data in the context of their chronic comorbidities is felt to place them at high risk for further clinical deterioration. Furthermore, it is not anticipated that the patient will be medically stable for discharge from the hospital within 2 midnights of admission.   * I certify that at the point of admission it is my clinical judgment that the patient will require inpatient hospital care spanning beyond 2 midnights from the point of admission due to high intensity of service, high risk for further deterioration and high frequency of surveillance required.*  Author: Norval Morton, MD 04/27/2022 9:58 AM  For on call review [email or web address].

## 2022-04-27 NOTE — Progress Notes (Signed)
Pt transported from room 22 in the ED to 3E23 on BIPAP without complications.

## 2022-04-27 NOTE — Consult Note (Signed)
[place] Nurse wound consult note  Reason for Consult: skin tear, biliary drain, leg bag with condom cath Discussed with bedside nurse; no needs related to drain and cath Wound type: skin tear sustained when moving from the stretcher to the bed Pressure Injury POA: NA Measurement: see nursing flow sheets Wound bed: clean and pink Dressing procedure/placement/frequency:  Skin care order set: non adherent and gauze change every 3 days.   Discussed POC with patient and bedside nurse.  Re consult if needed, will not follow at this time. Thanks  Elisa Kutner [employer], RN,CWOCN, CNS, [place] [phone])

## 2022-04-27 NOTE — ED Provider Notes (Addendum)
[facility] EMERGENCY DEPARTMENT Provider Note   CSN: [id number] Arrival date & time: 04/27/22  0122     History  Chief Complaint  Patient presents with   Fall   Shortness of Breath    Vincent Robinson is a 88 y.o. male.  88 year old male presents the ER today after a fall.  Patient has a history of interstitial lung disease, CHF is on 4 L of oxygen at baseline.  Patient had a mechanical fall today and could not get up for couple hours.  Finally admitted to check echo and was tachypneic so he called EMS.  On EMS arrival patient's oxygen saturations were 70% on his 4 L nasal cannula.  He was put on nonrebreather and brought here for further evaluation.  Patient denies any chest pain or other traumatic injuries.   Fall Associated symptoms include shortness of breath.  Shortness of Breath      Home Medications Prior to Admission medications   Medication Sig Start Date End Date Taking? Authorizing Provider  albuterol (VENTOLIN HFA) 108 (90 Base) MCG/ACT inhaler Inhale 1 puff into the lungs every 6 (six) hours as needed for wheezing or shortness of breath.    [provider]  ferrous sulfate 325 (65 FE) MG tablet Take 325 mg by mouth daily with breakfast.    [provider]  levothyroxine (SYNTHROID) 88 MCG tablet Take 88 mcg by mouth daily before breakfast.    [provider]  midodrine (PROAMATINE) 10 MG tablet TAKE ONE TABLET BY MOUTH THREE TIMES A DAY AFTER MEALS - TAKE IN ADDITION TO '5MG'$  TABLETS TO EQUAL '15MG'$  THREE TIMES A DAY 02/22/22   Bensimhon, Shaune Pascal, MD  midodrine (PROAMATINE) 5 MG tablet TAKE ONE TABLET BY MOUTH THREE TIMES A DAY WITH A MEAL -TAKE IN ADDITION TO '10MG'$  TABLETS TO EQUAL '15MG'$  THREE TIMES A DAY 01/29/22   Bensimhon, Shaune Pascal, MD  montelukast (SINGULAIR) 10 MG tablet Take 10 mg by mouth at bedtime.    [provider]  OPSUMIT 10 MG tablet TAKE 1 TABLET BY MOUTH ONCE DAILY 03/02/22   Bensimhon, Shaune Pascal, MD   pantoprazole (PROTONIX) 20 MG tablet Take 20 mg by mouth every morning.    [provider]  potassium chloride (KLOR-CON) 10 MEQ tablet Take 20 mEq by mouth daily.    [provider]  sildenafil (REVATIO) 20 MG tablet TAKE TWO TABLETS BY MOUTH THREE TIMES A DAY 04/19/22   Bensimhon, Shaune Pascal, MD  simvastatin (ZOCOR) 10 MG tablet Take 10 mg by mouth at bedtime.    [provider]  Sodium Chloride Flush (NORMAL SALINE FLUSH) 0.9 % SOLN Use as directed 12/04/21   Sandi Mariscal, MD  torsemide (DEMADEX) 20 MG tablet Take 3 tablets (60 mg total) by mouth daily AND 2 tablets (40 mg total) daily. ALTERNATING. 02/08/22   Rafael Bihari, FNP      Allergies    Oxycodone and Hydrocodone    Review of Systems   Review of Systems  Respiratory:  Positive for shortness of breath.     Physical Exam Updated Vital Signs BP 91/62   Pulse 90   Temp 97.7 F (36.5 C) (Axillary)   Resp (!) 21   Ht '5\' 7"'$  (1.702 m)   Wt 66.2 kg   SpO2 94%   BMI 22.87 kg/m  Physical Exam Vitals and nursing note reviewed.  Constitutional:      Appearance: He is well-developed.  HENT:  Head: Normocephalic and atraumatic.  Cardiovascular:     Rate and Rhythm: Normal rate.  Pulmonary:     Effort: Tachypnea and accessory muscle usage present. No respiratory distress.     Breath sounds: Rales present.  Abdominal:     General: There is no distension.  Musculoskeletal:        General: Normal range of motion.     Cervical back: Normal range of motion.     Right lower leg: Edema present.     Left lower leg: Edema present.  Skin:    General: Skin is warm and dry.  Neurological:     General: No focal deficit present.     Mental Status: He is alert.     ED Results / Procedures / Treatments   Labs (all labs ordered are listed, but only abnormal results are displayed) Labs Reviewed  COMPREHENSIVE METABOLIC PANEL - Abnormal; Notable for the following components:      Result Value    Calcium 8.6 (*)    Albumin 3.4 (*)    AST 43 (*)    Total Bilirubin 1.4 (*)    All other components within normal limits  LIPASE, BLOOD - Abnormal; Notable for the following components:   Lipase 59 (*)    All other components within normal limits  BRAIN NATRIURETIC PEPTIDE - Abnormal; Notable for the following components:   B Natriuretic Peptide 258.1 (*)    All other components within normal limits  CBC WITH DIFFERENTIAL/PLATELET - Abnormal; Notable for the following components:   WBC 11.4 (*)    RBC 3.99 (*)    MCV 100.3 (*)    Neutro Abs 8.2 (*)    Abs Immature Granulocytes 0.15 (*)    All other components within normal limits  URINALYSIS, ROUTINE W REFLEX MICROSCOPIC - Abnormal; Notable for the following components:   APPearance CLOUDY (*)    Hgb urine dipstick SMALL (*)    Leukocytes,Ua LARGE (*)    WBC, UA >50 (*)    Bacteria, UA FEW (*)    All other components within normal limits  I-STAT CHEM 8, ED - Abnormal; Notable for the following components:   Chloride 96 (*)    BUN 30 (*)    Calcium, Ion 1.00 (*)    All other components within normal limits  TROPONIN I (HIGH SENSITIVITY) - Abnormal; Notable for the following components:   Troponin I (High Sensitivity) 27 (*)    All other components within normal limits  TROPONIN I (HIGH SENSITIVITY) - Abnormal; Notable for the following components:   Troponin I (High Sensitivity) 30 (*)    All other components within normal limits  URINE CULTURE  MAGNESIUM  CBG MONITORING, ED    EKG None  Radiology DG Pelvis 1-2 Views  Result Date: 04/27/2022 CLINICAL DATA:  Fall, possible fracture. EXAM: PELVIS - 1-2 VIEW COMPARISON:  None Available. FINDINGS: There is no evidence of acute pelvic fracture, dislocation, or diastasis. Degenerative changes are noted in the lower lumbar spine and bilateral hips. Vascular calcifications are present in the lower extremities. Vascular stents are noted in the abdomen. IMPRESSION: No acute  fracture or dislocation. Electronically Signed   By: Brett Fairy M.D.   On: 04/27/2022 02:43   DG Chest Portable 1 View  Result Date: 04/27/2022 CLINICAL DATA:  Fall, back pain. EXAM: PORTABLE CHEST 1 VIEW COMPARISON:  09/04/2021 FINDINGS: The heart is enlarged the mediastinal contour is stable. Atherosclerotic calcification of the aorta is noted. The pulmonary vasculature  is distended. Patchy airspace disease is noted in the lungs bilaterally. No effusion or pneumothorax. Sternotomy wires are present over the midline. No acute osseous abnormality. IMPRESSION: 1. Cardiomegaly with pulmonary vascular congestion. 2. Patchy opacities in the lungs bilaterally, possible edema or infiltrate. Electronically Signed   By: Brett Fairy M.D.   On: 04/27/2022 02:41    Procedures .Critical Care  Performed by: Merrily Pew, MD Authorized by: Merrily Pew, MD   Critical care provider statement:    Critical care time (minutes):  30   Critical care was necessary to treat or prevent imminent or life-threatening deterioration of the following conditions:  Respiratory failure   Critical care was time spent personally by me on the following activities:  Development of treatment plan with patient or surrogate, discussions with consultants, evaluation of patient's response to treatment, examination of patient, ordering and review of laboratory studies, ordering and review of radiographic studies, ordering and performing treatments and interventions, pulse oximetry, re-evaluation of patient's condition and review of old charts     Medications Ordered in ED Medications  aspirin chewable tablet 324 mg (324 mg Oral Patient Refused/Not Given 04/27/22 0253)  cefTRIAXone (ROCEPHIN) 1 g in sodium chloride 0.9 % 100 mL IVPB (has no administration in time range)  furosemide (LASIX) injection 60 mg (60 mg Intravenous Given 04/27/22 0505)    ED Course/ Medical Decision Making/ A&P                           Medical Decision  Making Amount and/or Complexity of Data Reviewed Labs: ordered. Radiology: ordered.  Risk OTC drugs. Prescription drug management. Decision regarding hospitalization.   Patient with persistent tachypnea and respiratory distress despite nonrebreather.  Labs show BNP is elevated and chest x-ray showed diffuse pulmonary edema.  This is consistent with likely CHF.  No obvious traumatic injuries.  Started on BiPAP for respiratory effort and patient is DNR but would want to be intubated if it was reversible.  Discussed with hospitalist for admission to stepdown.   Final Clinical Impression(s) / ED Diagnoses Final diagnoses:  Acute on chronic respiratory failure with hypoxia [facility])    Rx / DC Orders ED Discharge Orders     None         Refugio Vandevoorde, Corene Cornea, MD 04/27/22 1610    Merrily Pew, MD 04/27/22 9604

## 2022-04-27 NOTE — Progress Notes (Signed)
Report received from ED RN.

## 2022-04-27 NOTE — Progress Notes (Signed)
Patient arrived to the unit on BiPaP at 1300. He was previously on 15 nonrebreather in the ED and then transitioned to 50 % FiO2 on admission to unit.   Pt is was unable to come off the BiPaP at 1300.

## 2022-04-28 DIAGNOSIS — I5033 Acute on chronic diastolic (congestive) heart failure: Secondary | ICD-10-CM | POA: Diagnosis not present

## 2022-04-28 DIAGNOSIS — I480 Paroxysmal atrial fibrillation: Secondary | ICD-10-CM | POA: Diagnosis not present

## 2022-04-28 DIAGNOSIS — J9621 Acute and chronic respiratory failure with hypoxia: Secondary | ICD-10-CM | POA: Diagnosis not present

## 2022-04-28 LAB — COMPREHENSIVE METABOLIC PANEL
ALT: 16 U/L (ref 0–44)
AST: 23 U/L (ref 15–41)
Albumin: 2.9 g/dL — ABNORMAL LOW (ref 3.5–5.0)
Alkaline Phosphatase: 66 U/L (ref 38–126)
Anion gap: 11 (ref 5–15)
BUN: 18 mg/dL (ref 8–23)
CO2: 29 mmol/L (ref 22–32)
Calcium: 8.4 mg/dL — ABNORMAL LOW (ref 8.9–10.3)
Chloride: 97 mmol/L — ABNORMAL LOW (ref 98–111)
Creatinine, Ser: 1.01 mg/dL (ref 0.61–1.24)
GFR, Estimated: >60 mL/min (ref >60)
Glucose, Bld: 103 mg/dL — ABNORMAL HIGH (ref 70–99)
Potassium: 3.4 mmol/L — ABNORMAL LOW (ref 3.5–5.1)
Sodium: 137 mmol/L (ref 135–145)
Total Bilirubin: 1.4 mg/dL — ABNORMAL HIGH (ref 0.3–1.2)
Total Protein: 7.3 g/dL (ref 6.5–8.1)

## 2022-04-28 LAB — CBC
HCT: 39.7 % (ref 39.0–52.0)
Hemoglobin: 13.3 g/dL (ref 13.0–17.0)
MCH: 33.5 pg (ref 26.0–34.0)
MCHC: 33.5 g/dL (ref 30.0–36.0)
MCV: 100 fL (ref 80.0–100.0)
Platelets: 199 10*3/uL (ref 150–400)
RBC: 3.97 MIL/uL — ABNORMAL LOW (ref 4.22–5.81)
RDW: 14.3 % (ref 11.5–15.5)
WBC: 11.9 10*3/uL — ABNORMAL HIGH (ref 4.0–10.5)
nRBC: 0 % (ref 0.0–0.2)

## 2022-04-28 LAB — URINE CULTURE: Culture: >=100000 — AB

## 2022-04-28 NOTE — Evaluation (Signed)
Physical Therapy Evaluation Patient Details Name: Vincent Robinson MRN: [id number] DOB: [birth year] Today's Date: 04/28/2022  History of Present Illness  Pt is 88 yo male who presents to [facility] on 04/27/22 with CHF exacerbation after a fall at ALF in bathroom. SpO2 was 78% at the time.  PMH: CAD s/p CABG 1998, AAA s/p EVAR 10/09, pulmonary HTN with cor pulmonale, chronic respiratory failure on 3LO2 day and Bipap at night. COPD. bradycardia, paroxysmal Afib, GI bleed, OSA, aortic stenosis, bladder cancer with chronic condom cath.  Clinical Impression  Pt admitted with above diagnosis. Pt received in bed, is coughing up phlegm and spitting out. Pt needed mod A to come to EOB. Once mobile pt realized he needed to have a BM. Mod A to transfer with RW to [facility]. Pt limited by back pain and LE generalized weakness. Pt with large BM BSC and increased WOB for perineal care. SPO2 dropped to 84% on 15L O2 and pt needed frequent rests. Pt was going to sit in recliner but due to coccyx pain and fatigue from using BSC and clean up, returned to bed instead. Pt plans to go to daughter's home upon initial d/c and back to independent living apt when recovered. Pt relays that Bipap mask hurt his nose last night and is hoping there is a different mask available. Pt currently with functional limitations due to the deficits listed below (see PT Problem List). Pt will benefit from skilled PT to increase their independence and safety with mobility to allow discharge to the venue listed below.          Recommendations for follow up therapy are one component of a multi-disciplinary discharge planning process, led by the attending physician.  Recommendations may be updated based on patient status, additional functional criteria and insurance authorization.  Follow Up Recommendations Home health PT      Assistance Recommended at Discharge Frequent or constant Supervision/Assistance  Patient can return home with the following  A little  help with walking and/or transfers;A little help with bathing/dressing/bathroom;Assistance with cooking/housework;Direct supervision/assist for medications management;Direct supervision/assist for financial management;Assist for transportation;Help with stairs or ramp for entrance    Equipment Recommendations None recommended by PT  Recommendations for Other Services       Functional Status Assessment Patient has had a recent decline in their functional status and demonstrates the ability to make significant improvements in function in a reasonable and predictable amount of time.     Precautions / Restrictions Precautions Precautions: Fall Precaution Comments: fell in bathroom while emptying condom cath Restrictions Weight Bearing Restrictions: No Other Position/Activity Restrictions: R gallbladder drain. Watch O2 sats, norm for him is 87-94% on 3L      Mobility  Bed Mobility Overal bed mobility: Needs Assistance Bed Mobility: Supine to Sit, Sit to Supine     Supine to sit: Min assist, HOB elevated Sit to supine: Mod assist   General bed mobility comments: min HHA for shifting wt SL to sit. Mod A for LE's for return to supine, in part due to LBP from falling    Transfers Overall transfer level: Needs assistance Equipment used: Rolling walker (2 wheels) Transfers: Sit to/from Stand, Bed to chair/wheelchair/BSC Sit to Stand: Mod assist   Step pivot transfers: Mod assist       General transfer comment: mod A for power up as well as to step bed to [facility] and BSC back to bed. Plan was to transfer to chair but pt so fatigued from toileting as well  as with coccyx pain that did not feel chair was appropriate currently    Ambulation/Gait Ambulation/Gait assistance: Mod assist Gait Distance (Feet): 2 Feet Assistive device: Rolling walker (2 wheels) Gait Pattern/deviations: Step-to pattern Gait velocity: decreased Gait velocity interpretation: <1.31 ft/sec, indicative of household  ambulator   General Gait Details: short steps taken along bedside and backing up. Pt with general fatigue to the point he needed assist moving RW. Unsteady with stepping.  Stairs            Wheelchair Mobility    Modified Rankin (Stroke Patients Only)       Balance Overall balance assessment: Needs assistance Sitting-balance support: Feet supported, Single extremity supported Sitting balance-Leahy Scale: Fair     Standing balance support: Bilateral upper extremity supported, During functional activity, Reliant on assistive device for balance Standing balance-Leahy Scale: Poor Standing balance comment: needed UE support as well as mod external assist                             Pertinent Vitals/Pain Pain Assessment Pain Assessment: Faces Faces Pain Scale: Hurts even more Pain Location: tailbone area and R LB Pain Descriptors / Indicators: Sore Pain Intervention(s): Limited activity within patient's tolerance, Monitored during session    Home Living Family/patient expects to be discharged to:: Private residence Living Arrangements: Alone Available Help at Discharge: Family;Available PRN/intermittently Type of Home: Apartment Home Access: Level entry       Home Layout: One level Home Equipment: Conservation officer, nature (2 wheels);Rollator (4 wheels);Cane - single point;Electric scooter;Shower seat Additional Comments: pt lives in independent living but he will go home to daughter's until he is ready to return to [place]. She has a stair lift at her house and can give 24/7 supervision    Prior Function Prior Level of Function : Needs assist             Mobility Comments: rollator in apt ADLs Comments: reports independence, daughter drives him to appts and helps with meds/ IADL's as needed     Hand Dominance   Dominant Hand: Right    Extremity/Trunk Assessment   Upper Extremity Assessment Upper Extremity Assessment: Defer to OT evaluation    Lower  Extremity Assessment Lower Extremity Assessment: Generalized weakness    Cervical / Trunk Assessment Cervical / Trunk Assessment: Kyphotic  Communication   Communication: HOH  Cognition Arousal/Alertness: Awake/alert Behavior During Therapy: WFL for tasks assessed/performed Overall Cognitive Status: Within Functional Limits for tasks assessed                                          General Comments General comments (skin integrity, edema, etc.): Pt reports that bipap mask was very uncomfortable last night and is wondering if there is a different one to try. SpO2 dropped to 84% on 15L O2 and pt with 2/4 DOE while toileting. SPO2 returned to 91% with rest. BP 119/57, HR in 110's.    Exercises     Assessment/Plan    PT Assessment Patient needs continued PT services  PT Problem List Decreased strength;Decreased activity tolerance;Decreased balance;Decreased mobility;Pain;Cardiopulmonary status limiting activity       PT Treatment Interventions DME instruction;Gait training;Functional mobility training;Therapeutic activities;Therapeutic exercise;Patient/family education;Balance training    PT Goals (Current goals can be found in the Care Plan section)  Acute Rehab PT Goals Patient Stated Goal:  return home PT Goal Formulation: With patient Time For Goal Achievement: 05/12/22 Potential to Achieve Goals: Good    Frequency Min 3X/week     Co-evaluation               AM-PAC PT "6 Clicks" Mobility  Outcome Measure Help needed turning from your back to your side while in a flat bed without using bedrails?: None Help needed moving from lying on your back to sitting on the side of a flat bed without using bedrails?: A Little Help needed moving to and from a bed to a chair (including a wheelchair)?: A Little Help needed standing up from a chair using your arms (e.g., wheelchair or bedside chair)?: A Lot Help needed to walk in hospital room?: Total Help needed  climbing 3-5 steps with a railing? : Total 6 Click Score: 14    End of Session Equipment Utilized During Treatment: Oxygen Activity Tolerance: Patient limited by fatigue Patient left: in bed;with call bell/phone within reach;with bed alarm set Nurse Communication: Mobility status PT Visit Diagnosis: Unsteadiness on feet (R26.81);Muscle weakness (generalized) (M62.81);Pain;Difficulty in walking, not elsewhere classified (R26.2) Pain - Right/Left: Right Pain - part of body: Hip    Time: 3785-8850 PT Time Calculation (min) (ACUTE ONLY): 61 min   Charges:   PT Evaluation $PT Eval Moderate Complexity: 1 Mod PT Treatments $Gait Training: 8-22 mins $Therapeutic Activity: 23-37 mins        Jobos chat preferred Office [place] 04/28/2022, 12:59 PM

## 2022-04-28 NOTE — Progress Notes (Addendum)
Progress Note  Patient Name: Vincent Robinson Date of Encounter: 04/28/2022  [facility] Cardiologist: Quay Burow, MD   Subjective   Short of breath, desatted on edge of bed.  Coughing mildly productive.  Feeling better throughout my stay in the room.  Physical therapy currently working with him.  Excellent.  Tailbone is sore.  Inpatient Medications    Scheduled Meds:  aspirin  324 mg Oral Once   enoxaparin (LOVENOX) injection  40 mg Subcutaneous Q24H   ferrous sulfate  325 mg Oral Q breakfast   levothyroxine  88 mcg Oral QAC breakfast   lidocaine  1 patch Transdermal Q24H   macitentan  10 mg Oral Daily   midodrine  15 mg Oral TID with meals   montelukast  10 mg Oral QHS   pantoprazole  20 mg Oral q morning   potassium chloride  20 mEq Oral Daily   sildenafil  40 mg Oral TID   simvastatin  10 mg Oral QHS   sodium chloride flush  3 mL Intravenous Q12H   Continuous Infusions:  cefTRIAXone (ROCEPHIN)  IV 1 g (04/28/22 0909)   furosemide 120 mg (04/28/22 0919)   PRN Meds: acetaminophen **OR** acetaminophen, levalbuterol   Vital Signs    Vitals:   04/28/22 0015 04/28/22 0500 04/28/22 0749 04/28/22 1047  BP: 94/60 (!) '98/57 93/65 96/61 '$  Pulse: 73 72 71 75  Resp: '18 17 17 18  '$ Temp: 98.9 F (37.2 C) 98.5 F (36.9 C) 97.8 F (36.6 C) 98 F (36.7 C)  TempSrc: Axillary Axillary Oral Oral  SpO2: 97% 97% 96% 91%  Weight:  63.5 kg    Height:        Intake/Output Summary (Last 24 hours) at 04/28/2022 1057 Last data filed at 04/28/2022 1048 Gross per 24 hour  Intake 706.72 ml  Output 1330 ml  Net -623.28 ml      04/28/2022    5:00 AM 04/27/2022    1:44 AM 02/08/2022    1:40 PM  Last 3 Weights  Weight (lbs) 139 lb 15.9 oz 146 lb 146 lb 6.4 oz  Weight (kg) 63.5 kg 66.225 kg 66.407 kg      Telemetry    Atrial fibrillation heart rate ranging from 80-110- Personally Reviewed  ECG    Atrial fibrillation- Personally Reviewed  Physical Exam   GEN: Sitting up on  edge of bed with physical therapy. Neck: Kyphotic Cardiac: Irregularly irregular, 3/6 systolic murmur, rubs, or gallops.  Respiratory: Crackles bilaterally. GI: Soft, nontender, non-distended  MS: Minimal lower extremity edema; No deformity. Neuro:  Nonfocal  Psych: Normal affect   Labs    High Sensitivity Troponin:   Recent Labs  Lab 04/27/22 0140 04/27/22 0437 04/27/22 1309  TROPONINIHS 27* 30* 37*     Chemistry Recent Labs  Lab 04/27/22 0140 04/27/22 0147 04/28/22 0212  NA 137 135 137  K 4.2 5.1 3.4*  CL 98 96* 97*  CO2 27  --  29  GLUCOSE 92 95 103*  BUN 21 30* 18  CREATININE 1.07 1.00 1.01  CALCIUM 8.6*  --  8.4*  MG 2.1  --   --   PROT 8.0  --  7.3  ALBUMIN 3.4*  --  2.9*  AST 43*  --  23  ALT 23  --  16  ALKPHOS 70  --  66  BILITOT 1.4*  --  1.4*  GFRNONAA >60  --  >60  ANIONGAP 12  --  11  Lipids No results for input(s): "CHOL", "TRIG", "HDL", "LABVLDL", "LDLCALC", "CHOLHDL" in the last 168 hours.  Hematology Recent Labs  Lab 04/27/22 0140 04/27/22 0147 04/28/22 0212  WBC 11.4*  --  11.9*  RBC 3.99*  --  3.97*  HGB 13.5 14.6 13.3  HCT 40.0 43.0 39.7  MCV 100.3*  --  100.0  MCH 33.8  --  33.5  MCHC 33.8  --  33.5  RDW 14.3  --  14.3  PLT 245  --  199   Thyroid  Recent Labs  Lab 04/27/22 0140  TSH 3.573    BNP Recent Labs  Lab 04/27/22 0140  BNP 258.1*    DDimer No results for input(s): "DDIMER" in the last 168 hours.   Radiology    DG Pelvis 1-2 Views  Result Date: 04/27/2022 CLINICAL DATA:  Fall, possible fracture. EXAM: PELVIS - 1-2 VIEW COMPARISON:  None Available. FINDINGS: There is no evidence of acute pelvic fracture, dislocation, or diastasis. Degenerative changes are noted in the lower lumbar spine and bilateral hips. Vascular calcifications are present in the lower extremities. Vascular stents are noted in the abdomen. IMPRESSION: No acute fracture or dislocation. Electronically Signed   By: Brett Fairy M.D.   On:  04/27/2022 02:43   DG Chest Portable 1 View  Result Date: 04/27/2022 CLINICAL DATA:  Fall, back pain. EXAM: PORTABLE CHEST 1 VIEW COMPARISON:  09/04/2021 FINDINGS: The heart is enlarged the mediastinal contour is stable. Atherosclerotic calcification of the aorta is noted. The pulmonary vasculature is distended. Patchy airspace disease is noted in the lungs bilaterally. No effusion or pneumothorax. Sternotomy wires are present over the midline. No acute osseous abnormality. IMPRESSION: 1. Cardiomegaly with pulmonary vascular congestion. 2. Patchy opacities in the lungs bilaterally, possible edema or infiltrate. Electronically Signed   By: Brett Fairy M.D.   On: 04/27/2022 02:41    Cardiac Studies   ECHO 2022:    1. Left ventricular ejection fraction, by estimation, is 60 to 65%. The  left ventricle has normal function. The left ventricle has no regional  wall motion abnormalities. There is moderate concentric left ventricular  hypertrophy. Left ventricular  diastolic function could not be evaluated. There is the interventricular  septum is flattened in systole and diastole, consistent with right  ventricular pressure and volume overload.   2. Right ventricular systolic function is severely reduced. The right  ventricular size is severely enlarged. There is severely elevated  pulmonary artery systolic pressure. The estimated right ventricular  systolic pressure is 50.3 mmHg.   3. Left atrial size was severely dilated.   4. Right atrial size was severely dilated.   5. A small pericardial effusion is present. The pericardial effusion is  posterior to the left ventricle.   6. The mitral valve is degenerative. Mild to moderate mitral valve  regurgitation. No evidence of mitral stenosis.   7. Tricuspid valve regurgitation is moderate to severe.   8. Moderate aortic stenosis. Vmax 3.6 m/s, MG 30.2 mmHG, AVA 1.11 cm2, DI  0.32. All consistent with moderate AS. The aortic valve is tricuspid.   There is severe calcifcation of the aortic valve. There is severe  thickening of the aortic valve. Aortic  valve regurgitation is mild. Moderate aortic valve stenosis.   9. There is mild dilatation of the ascending aorta, measuring 42 mm.  10. The inferior vena cava is dilated in size with <50% respiratory  variability, suggesting right atrial pressure of 15 mmHg.   Comparison(s): No significant change  from prior study.   Patient Profile     88 y.o. male with a history of CAD s/p CABG, AAA s/p EVAR, OSA on BIPAP not CPAP, PAF (no AC with prior GI bleed), PAH and PH with cor Pulmonae with ILD with history of hypotension requiring midodrine, symptomatic bradycardia with declined PPM in the past, who presents after fall.  Assessment & Plan    Pulmonary hypertension with interstitial lung disease, cor pulmonale and chronic hypotension - Creatinine 1.01, stable on Lasix IV 120 mg twice a day. -Has midodrine 15 mg 3 times a day with meals to help support blood pressure -On sildenafil 40 mg 3 times daily for pulmonary hypertension.  Also on macitentan 10 mg a day. -Very appreciative of Dr. Haroldine Laws.  No other changes made today. High risk medications/extremely complex medical history.  DNR/DNI  Coronary artery disease status post CABG in 1998 - Stable  AAA - Status post EVAR 2009.  Stable.  Bradycardia - Previously declined pacemaker.  Prior GI bleed - Not on anticoagulation given his atrial fibrillation because of this.  Aortic stenosis - Not a TAVR candidate.  For questions or updates, please contact [place] Please consult [email or web address] for contact info under        Signed, Candee Furbish, MD  04/28/2022, 10:57 AM

## 2022-04-28 NOTE — Progress Notes (Signed)
PROGRESS NOTE  Vincent Robinson  WUX:[id number] DOB: 12-06-1922 DOA: 04/27/2022 PCP: Katherina Mires, MD   Brief Narrative:  Patient is a 88 year old male with history of diastolic congestive heart failure, coronary artery disease status post CABG, paroxysmal A-fib, chronic hypotension on midodrine, pulmonary hypertension with cor pulmonale, ILD, COPD, chronic respiratory failure on 3 L of oxygen, hypothyroidism, abdominal aortic aneurysm status post endovascular repair, bladder cancer, OSA on CPAP who presents with complaint of fall, shortness of breath from home.  He lives alone, ambulates with the help of walker, scooty.  Patient also reported intermittent cough, leg edema.  On presentation his blood pressure was low.  Lab work showed elevated BNP.  Chest x-ray showed cardiomegaly with pulmonary vascular congestion, patchy opacities.  Patient had to be put on BiPAP for respiratory distress.  Started on IV Lasix for volume overload.  Cardiology consulted for CHF.  On high flow oxygen this morning, trying to wean   Assessment & Plan:  Active Problems:   Acute on chronic respiratory failure with hypoxia (HCC)   Acute on chronic diastolic CHF (congestive heart failure) (HCC)   PAH (pulmonary artery hypertension) (HCC)   COPD (chronic obstructive pulmonary disease) (HCC)   ILD (interstitial lung disease) (HCC)   Chronic hypotension   UTI (urinary tract infection)   Leukocytosis   Elevated troponin   Atrial fibrillation ([place])   Fall at home, initial encounter   Hypothyroidism   Sleep apnea   Acute on chronic hypoxic respiratory failure: On presentation, he was hypoxic, saturating 78% on 4 L of oxygen.  He uses 3 L of oxygen at home.  Respiratory failure is secondary to decompensated CHF with volume overload, pulm hypertension.  Initially put on BiPAP, now on high flow oxygen.  Will attempt to wean the oxygen to his baseline requirement  Acute on chronic diastolic congestive heart failure:  Elevated BNP, presented with cough, leg edema.  Chest ray showed cardiomegaly with bilateral pulmonary infiltrates.  Last echo had shown EF of 60 to 65%, indeterminate diastolic parameters.  Takes torsemide at home.  Started on Lasix IV.  Monitor volume status, input, output, daily weight.  Cardiology following.  Monitor BMP while on Lasix.  History of COPD/ILD: Follows with Dr. Vaughan Browner, pulmonology.  Continue bronchodilators as needed, supplemental oxygen  History of pulm hypertension with cor pulmonale: Continue sildenafil, macitentan  Chronic hypotension: On midodrine.  Monitor blood pressure.  Given a bolus of IV fluid in the emergency department.  Hold further IV fluids  Suspected UTI: UA showed large leukocytes.  Urine culture sent, started on Rocephin by the admitting physician.  Has mild leukocytosis.  Denies dysuria  Coronary artery disease/elevated troponin: No anginal symptoms.  Elevated troponin with flat trend.  Most likely this is from demand ischemia from CHF exacerbation.  Paroxysmal A-fib: Currently rate is controlled.  Not on anticoagulant due to history of GI bleed.  History of fall: Fell at home.  PT/OT consulted.  X-ray of pelvis did not show any fracture or dislocation  Hypothyroidism: Continue levothyroxine  Hypokalemia: Supplemented with potassium  Status post right upper quadrant drain/cholecystostomy drain: Says he had this for last 2 to 3 years.  Follows with [facility] every 2 months to change the bag.  Does not know the details.  Drain with bilious output  History of abdominal aortic aneurysm: Status post endovascular stenting in 2009  OSA: Continue BiPAP at night, uses BiPAP at home          DVT  prophylaxis:enoxaparin (LOVENOX) injection 40 mg Start: 04/27/22 1015     Code Status: DNR  Family Communication: Called and discussed with daughter on phone on 7/8  Patient status: Inpatient  Patient is from : Home  Anticipated discharge to:  Home  Estimated DC date: 2 to 3 days, needs cardiology clearance   Consultants: Cardiology  Procedures: None  Antimicrobials:  Anti-infectives (From admission, onward)    Start     Dose/Rate Route Frequency Ordered Stop   04/27/22 0745  cefTRIAXone (ROCEPHIN) 1 g in sodium chloride 0.9 % 100 mL IVPB        1 g 200 mL/hr over 30 Minutes Intravenous Every 24 hours 04/27/22 0736         Subjective: Patient seen and examined at bedside this morning.  Hemodynamically stable.  Very pleasant elderly gentleman, appears comfortable, speaking in full sentences.  On high flow oxygen.  Denies any worsening shortness of breath, cough or chest pain.  He says he is feeling better.  Objective: Vitals:   04/27/22 2011 04/28/22 0015 04/28/22 0500 04/28/22 0749  BP: 105/64 94/60 (!) 98/57   Pulse: 85 73 72 71  Resp: '19 18 17 17  '$ Temp: 99 F (37.2 C) 98.9 F (37.2 C) 98.5 F (36.9 C) 97.8 F (36.6 C)  TempSrc: Oral Axillary Axillary Oral  SpO2: 95% 97% 97% 96%  Weight:   63.5 kg   Height:        Intake/Output Summary (Last 24 hours) at 04/28/2022 0759 Last data filed at 04/28/2022 0600 Gross per 24 hour  Intake 350.72 ml  Output 1330 ml  Net -979.28 ml   Filed Weights   04/27/22 0144 04/28/22 0500  Weight: 66.2 kg 63.5 kg    Examination:  General exam: Overall comfortable, not in distress, very deconditioned, chronically ill looking, pleasant elderly gentleman HEENT: PERRL Respiratory system: Diminished sounds bilaterally, no wheezes or crackles  Cardiovascular system: S1 & S2 heard, RRR.  Gastrointestinal system: Abdomen is nondistended, soft and nontender.  Right upper quadrant drain Central nervous system: Alert and oriented Extremities: No edema, no clubbing ,no cyanosis Skin: No rashes, no ulcers,no icterus     Data Reviewed: I have personally reviewed following labs and imaging studies  CBC: Recent Labs  Lab 04/27/22 0140 04/27/22 0147 04/28/22 0212  WBC 11.4*   --  11.9*  NEUTROABS 8.2*  --   --   HGB 13.5 14.6 13.3  HCT 40.0 43.0 39.7  MCV 100.3*  --  100.0  PLT 245  --  665   Basic Metabolic Panel: Recent Labs  Lab 04/27/22 0140 04/27/22 0147 04/28/22 0212  NA 137 135 137  K 4.2 5.1 3.4*  CL 98 96* 97*  CO2 27  --  29  GLUCOSE 92 95 103*  BUN 21 30* 18  CREATININE 1.07 1.00 1.01  CALCIUM 8.6*  --  8.4*  MG 2.1  --   --      No results found for this or any previous visit (from the past 240 hour(s)).   Radiology Studies: DG Pelvis 1-2 Views  Result Date: 04/27/2022 CLINICAL DATA:  Fall, possible fracture. EXAM: PELVIS - 1-2 VIEW COMPARISON:  None Available. FINDINGS: There is no evidence of acute pelvic fracture, dislocation, or diastasis. Degenerative changes are noted in the lower lumbar spine and bilateral hips. Vascular calcifications are present in the lower extremities. Vascular stents are noted in the abdomen. IMPRESSION: No acute fracture or dislocation. Electronically Signed   By: Mickel Baas  Lovena Le M.D.   On: 04/27/2022 02:43   DG Chest Portable 1 View  Result Date: 04/27/2022 CLINICAL DATA:  Fall, back pain. EXAM: PORTABLE CHEST 1 VIEW COMPARISON:  09/04/2021 FINDINGS: The heart is enlarged the mediastinal contour is stable. Atherosclerotic calcification of the aorta is noted. The pulmonary vasculature is distended. Patchy airspace disease is noted in the lungs bilaterally. No effusion or pneumothorax. Sternotomy wires are present over the midline. No acute osseous abnormality. IMPRESSION: 1. Cardiomegaly with pulmonary vascular congestion. 2. Patchy opacities in the lungs bilaterally, possible edema or infiltrate. Electronically Signed   By: Brett Fairy M.D.   On: 04/27/2022 02:41    Scheduled Meds:  aspirin  324 mg Oral Once   enoxaparin (LOVENOX) injection  40 mg Subcutaneous Q24H   ferrous sulfate  325 mg Oral Q breakfast   levothyroxine  88 mcg Oral QAC breakfast   lidocaine  1 patch Transdermal Q24H   macitentan  10 mg  Oral Daily   midodrine  15 mg Oral TID with meals   montelukast  10 mg Oral QHS   pantoprazole  20 mg Oral q morning   potassium chloride  20 mEq Oral Daily   sildenafil  40 mg Oral TID   simvastatin  10 mg Oral QHS   sodium chloride flush  3 mL Intravenous Q12H   Continuous Infusions:  cefTRIAXone (ROCEPHIN)  IV Stopped (04/27/22 0902)   furosemide 120 mg (04/27/22 2114)     LOS: 1 day   Shelly Coss, MD [facility] P7/05/2022, 7:59 AM

## 2022-04-29 DIAGNOSIS — I4821 Permanent atrial fibrillation: Secondary | ICD-10-CM | POA: Diagnosis not present

## 2022-04-29 LAB — CBC
HCT: 36.9 % — ABNORMAL LOW (ref 39.0–52.0)
Hemoglobin: 12.4 g/dL — ABNORMAL LOW (ref 13.0–17.0)
MCH: 34 pg (ref 26.0–34.0)
MCHC: 33.6 g/dL (ref 30.0–36.0)
MCV: 101.1 fL — ABNORMAL HIGH (ref 80.0–100.0)
Platelets: 167 10*3/uL (ref 150–400)
RBC: 3.65 MIL/uL — ABNORMAL LOW (ref 4.22–5.81)
RDW: 14.1 % (ref 11.5–15.5)
WBC: 10.3 10*3/uL (ref 4.0–10.5)
nRBC: 0 % (ref 0.0–0.2)

## 2022-04-29 LAB — BASIC METABOLIC PANEL
Anion gap: 11 (ref 5–15)
BUN: 20 mg/dL (ref 8–23)
CO2: 27 mmol/L (ref 22–32)
Calcium: 8 mg/dL — ABNORMAL LOW (ref 8.9–10.3)
Chloride: 95 mmol/L — ABNORMAL LOW (ref 98–111)
Creatinine, Ser: 0.99 mg/dL (ref 0.61–1.24)
GFR, Estimated: >60 mL/min (ref >60)
Glucose, Bld: 101 mg/dL — ABNORMAL HIGH (ref 70–99)
Potassium: 3.4 mmol/L — ABNORMAL LOW (ref 3.5–5.1)
Sodium: 133 mmol/L — ABNORMAL LOW (ref 135–145)

## 2022-04-29 MED ORDER — LEVOTHYROXINE SODIUM 100 MCG PO TABS
100.0000 ug | ORAL_TABLET | Freq: Every day | ORAL | Status: DC
Start: 2022-04-30 — End: 2022-05-07
  Administered 2022-04-30 – 2022-05-07 (×8): 100 ug via ORAL
  Filled 2022-04-29 (×8): qty 1

## 2022-04-29 NOTE — TOC Initial Note (Addendum)
Transition of Care [facility]) - Initial/Assessment Note    Patient Details  Name: Vincent Robinson MRN: [id number] Date of Birth: Nov 14, 1922  Transition [facility]) CM/SW Contact:    Pollie Friar, RN Phone Number: 04/29/2022, 12:02 PM  Clinical Narrative:                 Pt is from [place] [facility] [address] in [place]. He states he plans to go to his daughters at discharge for a few weeks.  Pt has oxygen at home through [employer]/ bipap.  Pt states his daughter organizes his medications in a pill box and provides the needed transportation. CM called and spoke to pts daughter (with pt permission) and she doesn't feel she can manage him at home. She has RA and needs him to be pretty independent for him to d/c to her home. She is wanting SNF rehab. She will come to the hospital tomorrow and talk with her father.  TOC following.  1411: Daughter states she has talked him into SNF and they prefer [facility]. CM has updated CSW and sent referral to [facility].   Expected Discharge Plan: Skilled Nursing Facility Barriers to Discharge: Continued Medical Work up   Patient Goals and CMS Choice   CMS Medicare.gov Compare Post Acute Care list provided to:: Patient Choice offered to / list presented to : Patient, Adult Children  Expected Discharge Plan and Services Expected Discharge Plan: [place] In-house Referral: Clinical Social Work Discharge Planning Services: CM Consult Post Acute Care Choice: [place] Living arrangements for the past 2 months: Apartment                                      Prior Living Arrangements/Services Living arrangements for the past 2 months: Apartment Lives with:: Self Patient language and need for interpreter reviewed:: Yes Do you feel safe going back to the place where you live?: Yes      Need for Family Participation in Patient Care: Yes (Comment) Care giver support system in place?: No (comment) Current home services:  DME (shower seat/ scooter/ rollator) Criminal Activity/Legal Involvement Pertinent to Current Situation/Hospitalization: No - Comment as needed  Activities of Daily Living      Permission Sought/Granted                  Emotional Assessment Appearance:: Appears stated age Attitude/Demeanor/Rapport: Engaged Affect (typically observed): Accepting Orientation: : Oriented to Self, Oriented to Place, Oriented to  Time, Oriented to Situation   Psych Involvement: No (comment)  Admission diagnosis:  Acute exacerbation of CHF (congestive heart failure) (HCC) [I50.9] Acute on chronic respiratory failure with hypoxia (HCC) [J96.21] Patient Active Problem List   Diagnosis Date Noted   Acute exacerbation of CHF (congestive heart failure) ([place]) 04/27/2022   PAH (pulmonary artery hypertension) ([place]) 04/27/2022   UTI (urinary tract infection) 04/27/2022   Leukocytosis 04/27/2022   Fall at home, initial encounter 04/27/2022   Symptomatic sinus bradycardia 09/06/2021   Near syncope 09/05/2021   Prolonged QT interval 07/09/2021   CHF exacerbation ([place]) 07/08/2021   Esophageal obstruction due to food impaction 07/08/2021   Acute on chronic diastolic CHF (congestive heart failure) ([place]) 07/08/2021   Malignant neoplasm of overlapping sites of bladder ([place]) 03/09/2021   Pneumonia due to COVID-19 virus 11/14/2020   Elevated troponin 11/14/2020   Bladder mass 11/14/2020   Acute cholecystitis 09/08/2020  Diverticular hemorrhage    Rectal bleeding 03/22/2020   Acute GI bleeding 03/22/2020   Hypokalemia    Chronic hypotension    Acute respiratory failure with hypoxia ([place]) 12/18/2019   Acute on chronic right-sided heart failure ([place]) 12/18/2019   Pressure injury of skin 11/24/2019   Chronic respiratory failure (HCC)    Acute on chronic respiratory failure with hypoxia ([place]) 11/23/2019   Right heart failure (HCC)    S/P CABG (coronary artery bypass graft) 11/22/2019   Chest pain 11/22/2019    Atrial fibrillation ([place]) 08/04/2019   Pulmonary hypertension, unspecified ([place]) 10/11/2017   Aortic stenosis, moderate 10/11/2017   Coronary artery disease 08/30/2017   AAA (abdominal aortic aneurysm) without rupture 08/30/2017   Acute blood loss anemia 06/07/2017   Hemorrhagic shock ([place]) 06/07/2017   Lower GI bleed 06/05/2017   Sleep apnea 06/05/2017   Hypothyroidism 06/05/2017   Hyperlipidemia 06/05/2017   Asthma 06/05/2017   ILD (interstitial lung disease) ([place]) 05/28/2017   Pneumothorax on right 05/20/2017   COPD (chronic obstructive pulmonary disease) ([place]) 05/20/2017   Pneumothorax 05/20/2017   Skin cancer 10/23/2015   PCP:  Katherina Mires, MD Pharmacy:   [facility] [address] - [place], [place] - [address] [place] [address], [address] [place] [address] Phone: [phone] Fax: [phone]  [place] [address] [place], [place] [address] Phone: [phone] Fax: [phone]  [place], [address] [place] [address] [place] [address] Phone: [phone] Fax: [facility] [id number] - [place], [place] [address] Phone: [phone] Fax: [phone]     Social Determinants of Health (SDOH) Interventions    Readmission Risk Interventions    07/11/2021   12:38 PM 09/19/2020    3:18 PM 03/25/2020   11:02 AM  Readmission Risk Prevention Plan  Transportation Screening Complete Complete Complete  HRI or Home Care Consult  Complete Patient refused  Social Work Consult for [place] Planning/Counseling  Complete   Palliative Care Screening  Complete Not Applicable  Medication Review Press photographer) Complete Complete Complete  PCP or Specialist appointment within 3-5 days of discharge Complete    HRI or [place] Complete    SW Recovery Care/Counseling Consult Complete    [place] Not Applicable

## 2022-04-29 NOTE — Evaluation (Signed)
Occupational Therapy Evaluation Patient Details Name: Vincent Robinson MRN: [id number] DOB: 1922/12/09 Today's Date: 04/29/2022   History of Present Illness Pt is 88 yo male who presents to [facility]22 with CHF exacerbation after a fall at ALF in bathroom. SpO2 was 78% at the time.  PMH: CAD s/p CABG 1998, AAA s/p EVAR 10/09, pulmonary HTN with cor pulmonale, chronic respiratory failure on 3LO2 day and Bipap at night. COPD. bradycardia, paroxysmal Afib, GI bleed, OSA, aortic stenosis, bladder cancer with chronic condom cath.   Clinical Impression   Pt admitted for concerns listed above. PTA pt reports independence with all ADL's and functional mobility. He is requiring up to mod A for all mobility due to weakness and decreased activity tolerance. He is requiring a RW for safety. Recommending SNF unless family is able to provide 24/7 assist. OT will continue to follow acutely.       Recommendations for follow up therapy are one component of a multi-disciplinary discharge planning process, led by the attending physician.  Recommendations may be updated based on patient status, additional functional criteria and insurance authorization.   Follow Up Recommendations  Skilled nursing-short term rehab (<3 hours/day)    Assistance Recommended at Discharge Frequent or constant Supervision/Assistance  Patient can return home with the following A lot of help with walking and/or transfers;A lot of help with bathing/dressing/bathroom    Functional Status Assessment  Patient has had a recent decline in their functional status and demonstrates the ability to make significant improvements in function in a reasonable and predictable amount of time.  Equipment Recommendations  None recommended by OT    Recommendations for Other Services       Precautions / Restrictions Precautions Precautions: Fall Precaution Comments: fell in bathroom while emptying condom cath Restrictions Weight Bearing Restrictions:  No Other Position/Activity Restrictions: R gallbladder drain. Watch O2 sats, norm for him is 87-94% on 3L      Mobility Bed Mobility Overal bed mobility: Needs Assistance Bed Mobility: Supine to Sit, Sit to Supine     Supine to sit: Min assist, HOB elevated Sit to supine: Min assist   General bed mobility comments: min A due to pain in tail bone    Transfers Overall transfer level: Needs assistance Equipment used: Rolling walker (2 wheels) Transfers: Sit to/from Stand Sit to Stand: Mod assist           General transfer comment: mod A for power up      Balance Overall balance assessment: Needs assistance Sitting-balance support: Feet supported, Single extremity supported Sitting balance-Leahy Scale: Fair     Standing balance support: Bilateral upper extremity supported, During functional activity, Reliant on assistive device for balance Standing balance-Leahy Scale: Poor Standing balance comment: needed UE support as well as mod external assist                           ADL either performed or assessed with clinical judgement   ADL Overall ADL's : Needs assistance/impaired Eating/Feeding: Set up;Sitting   Grooming: Set up;Sitting   Upper Body Bathing: Set up;Sitting   Lower Body Bathing: Moderate assistance;Sitting/lateral leans;Sit to/from stand   Upper Body Dressing : Set up;Sitting   Lower Body Dressing: Minimal assistance;Moderate assistance;Sitting/lateral leans;Sit to/from stand   Toilet Transfer: Moderate assistance;Stand-pivot   Toileting- Clothing Manipulation and Hygiene: Minimal assistance;Moderate assistance;Sitting/lateral lean;Sit to/from stand         General ADL Comments: Pt limited by pain in  his tailbone and weakness     Vision Baseline Vision/History: 1 Wears glasses Ability to See in Adequate Light: 0 Adequate Patient Visual Report: No change from baseline Vision Assessment?: No apparent visual deficits      Perception     Praxis      Pertinent Vitals/Pain Pain Assessment Pain Assessment: Faces Faces Pain Scale: Hurts little more Pain Location: tailbone area and R LB Pain Descriptors / Indicators: Sore Pain Intervention(s): Limited activity within patient's tolerance, Monitored during session, Repositioned     Hand Dominance Right   Extremity/Trunk Assessment Upper Extremity Assessment Upper Extremity Assessment: Generalized weakness   Lower Extremity Assessment Lower Extremity Assessment: Generalized weakness   Cervical / Trunk Assessment Cervical / Trunk Assessment: Kyphotic   Communication Communication Communication: HOH   Cognition Arousal/Alertness: Awake/alert Behavior During Therapy: WFL for tasks assessed/performed Overall Cognitive Status: Within Functional Limits for tasks assessed                                       General Comments  O2 maintaining on 6L    Exercises     Shoulder Instructions      Home Living Family/patient expects to be discharged to:: Private residence Living Arrangements: Alone Available Help at Discharge: Family;Available PRN/intermittently Type of Home: Apartment Home Access: Level entry     Home Layout: One level     Bathroom Shower/Tub: Walk-in shower;Tub/shower unit   Bathroom Toilet: Standard Bathroom Accessibility: Yes   Home Equipment: Conservation officer, nature (2 wheels);Rollator (4 wheels);Cane - single point;Electric scooter;Shower seat   Additional Comments: pt lives in independent living but he will go home to daughter's until he is ready to return to [facility]. She has a stair lift at her house and can give 24/7 supervision      Prior Functioning/Environment Prior Level of Function : Needs assist             Mobility Comments: rollator in apt ADLs Comments: reports independence, daughter drives him to appts and helps with meds/ IADL's as needed        OT Problem List: Decreased strength;Decreased  activity tolerance;Impaired balance (sitting and/or standing);Cardiopulmonary status limiting activity      OT Treatment/Interventions: Self-care/ADL training;Therapeutic exercise;Energy conservation;DME and/or AE instruction;Therapeutic activities;Patient/family education;Balance training    OT Goals(Current goals can be found in the care plan section) Acute Rehab OT Goals Patient Stated Goal: To go home OT Goal Formulation: With patient Time For Goal Achievement: 05/13/22 Potential to Achieve Goals: Good ADL Goals Pt Will Perform Grooming: with modified independence;standing Pt Will Perform Lower Body Bathing: with modified independence;sitting/lateral leans;sit to/from stand Pt Will Perform Lower Body Dressing: with modified independence;sitting/lateral leans;sit to/from stand Pt Will Transfer to Toilet: with modified independence;ambulating Pt Will Perform Toileting - Clothing Manipulation and hygiene: with modified independence;sitting/lateral leans;sit to/from stand  OT Frequency: Min 2X/week    Co-evaluation              AM-PAC OT "6 Clicks" Daily Activity     Outcome Measure Help from another person eating meals?: A Little Help from another person taking care of personal grooming?: A Little Help from another person toileting, which includes using toliet, bedpan, or urinal?: A Lot Help from another person bathing (including washing, rinsing, drying)?: A Lot Help from another person to put on and taking off regular upper body clothing?: A Little Help from another person to put on  and taking off regular lower body clothing?: A Lot 6 Click Score: 15   End of Session Equipment Utilized During Treatment: Oxygen Nurse Communication: Mobility status  Activity Tolerance: Patient tolerated treatment well;Patient limited by pain Patient left: in bed;with call bell/phone within reach  OT Visit Diagnosis: Unsteadiness on feet (R26.81);Other abnormalities of gait and mobility  (R26.89);Muscle weakness (generalized) (M62.81)                Time: 8727-6184 OT Time Calculation (min): 36 min Charges:  OT General Charges $OT Visit: 1 Visit OT Evaluation $OT Eval Moderate Complexity: 1 Mod OT Treatments $Therapeutic Activity: 8-22 mins  Timarie Labell H., OTR/L Acute Rehabilitation  Karys Meckley Elane Nixon Sparr 04/29/2022, 7:26 PM

## 2022-04-29 NOTE — Progress Notes (Signed)
Progress Note  Patient Name: Vincent Robinson Date of Encounter: 04/29/2022  [facility] Cardiologist: Quay Burow, MD   Subjective   Short of breath has improved.  Inpatient Medications    Scheduled Meds:  aspirin  324 mg Oral Once   enoxaparin (LOVENOX) injection  40 mg Subcutaneous Q24H   ferrous sulfate  325 mg Oral Q breakfast   levothyroxine  88 mcg Oral QAC breakfast   lidocaine  1 patch Transdermal Q24H   macitentan  10 mg Oral Daily   midodrine  15 mg Oral TID with meals   montelukast  10 mg Oral QHS   pantoprazole  20 mg Oral q morning   potassium chloride  20 mEq Oral Daily   sildenafil  40 mg Oral TID   simvastatin  10 mg Oral QHS   sodium chloride flush  3 mL Intravenous Q12H   Continuous Infusions:  cefTRIAXone (ROCEPHIN)  IV 1 g (04/28/22 0909)   furosemide 120 mg (04/28/22 1724)   PRN Meds: acetaminophen **OR** acetaminophen, levalbuterol   Vital Signs    Vitals:   04/28/22 1727 04/28/22 2318 04/29/22 0605 04/29/22 0902  BP:   98/65 (!) 92/54  Pulse:  60 60 82  Resp: '19 20 18 18  '$ Temp:   98 F (36.7 C) 98.1 F (36.7 C)  TempSrc:   Oral Oral  SpO2:  98% 100% 97%  Weight:   62.9 kg   Height:        Intake/Output Summary (Last 24 hours) at 04/29/2022 0958 Last data filed at 04/29/2022 0817 Gross per 24 hour  Intake 1922.54 ml  Output 1075 ml  Net 847.54 ml      04/29/2022    6:05 AM 04/28/2022    5:00 AM 04/27/2022    1:44 AM  Last 3 Weights  Weight (lbs) 138 lb 10.7 oz 139 lb 15.9 oz 146 lb  Weight (kg) 62.9 kg 63.5 kg 66.225 kg      Telemetry    Atrial fibrillation heart rate ranging from 80-110- Personally Reviewed  ECG    Atrial fibrillation- Personally Reviewed  Physical Exam   GEN: Laying down in bed, appears fairly comfortable.  Kyphotic.  Pleasant, thin Neck: Kyphotic Cardiac: Irregularly irregular, 3/6 systolic murmur, rubs, or gallops.  Respiratory: Clearer lung fields this morning. GI: Soft, nontender, non-distended   MS: Minimal lower extremity edema; No deformity. Neuro:  Nonfocal  Psych: Normal affect   Labs    High Sensitivity Troponin:   Recent Labs  Lab 04/27/22 0140 04/27/22 0437 04/27/22 1309  TROPONINIHS 27* 30* 37*     Chemistry Recent Labs  Lab 04/27/22 0140 04/27/22 0147 04/28/22 0212 04/29/22 0207  NA 137 135 137 133*  K 4.2 5.1 3.4* 3.4*  CL 98 96* 97* 95*  CO2 27  --  29 27  GLUCOSE 92 95 103* 101*  BUN 21 30* 18 20  CREATININE 1.07 1.00 1.01 0.99  CALCIUM 8.6*  --  8.4* 8.0*  MG 2.1  --   --   --   PROT 8.0  --  7.3  --   ALBUMIN 3.4*  --  2.9*  --   AST 43*  --  23  --   ALT 23  --  16  --   ALKPHOS 70  --  66  --   BILITOT 1.4*  --  1.4*  --   GFRNONAA >60  --  >60 >60  ANIONGAP 12  --  11 11    Lipids No results for input(s): "CHOL", "TRIG", "HDL", "LABVLDL", "LDLCALC", "CHOLHDL" in the last 168 hours.  Hematology Recent Labs  Lab 04/27/22 0140 04/27/22 0147 04/28/22 0212 04/29/22 0207  WBC 11.4*  --  11.9* 10.3  RBC 3.99*  --  3.97* 3.65*  HGB 13.5 14.6 13.3 12.4*  HCT 40.0 43.0 39.7 36.9*  MCV 100.3*  --  100.0 101.1*  MCH 33.8  --  33.5 34.0  MCHC 33.8  --  33.5 33.6  RDW 14.3  --  14.3 14.1  PLT 245  --  199 167   Thyroid  Recent Labs  Lab 04/27/22 0140  TSH 3.573    BNP Recent Labs  Lab 04/27/22 0140  BNP 258.1*    DDimer No results for input(s): "DDIMER" in the last 168 hours.   Radiology    No results found.  Cardiac Studies   ECHO 2022:    1. Left ventricular ejection fraction, by estimation, is 60 to 65%. The  left ventricle has normal function. The left ventricle has no regional  wall motion abnormalities. There is moderate concentric left ventricular  hypertrophy. Left ventricular  diastolic function could not be evaluated. There is the interventricular  septum is flattened in systole and diastole, consistent with right  ventricular pressure and volume overload.   2. Right ventricular systolic function is  severely reduced. The right  ventricular size is severely enlarged. There is severely elevated  pulmonary artery systolic pressure. The estimated right ventricular  systolic pressure is 24.4 mmHg.   3. Left atrial size was severely dilated.   4. Right atrial size was severely dilated.   5. A small pericardial effusion is present. The pericardial effusion is  posterior to the left ventricle.   6. The mitral valve is degenerative. Mild to moderate mitral valve  regurgitation. No evidence of mitral stenosis.   7. Tricuspid valve regurgitation is moderate to severe.   8. Moderate aortic stenosis. Vmax 3.6 m/s, MG 30.2 mmHG, AVA 1.11 cm2, DI  0.32. All consistent with moderate AS. The aortic valve is tricuspid.  There is severe calcifcation of the aortic valve. There is severe  thickening of the aortic valve. Aortic  valve regurgitation is mild. Moderate aortic valve stenosis.   9. There is mild dilatation of the ascending aorta, measuring 42 mm.  10. The inferior vena cava is dilated in size with <50% respiratory  variability, suggesting right atrial pressure of 15 mmHg.   Comparison(s): No significant change from prior study.   Patient Profile     88 y.o. male with a history of CAD s/p CABG, AAA s/p EVAR, OSA on BIPAP not CPAP, PAF (no AC with prior GI bleed), PAH and PH with cor Pulmonae with ILD with history of hypotension requiring midodrine, symptomatic bradycardia with declined PPM in the past, who presents after fall.  Assessment & Plan    Pulmonary hypertension with interstitial lung disease, cor pulmonale and chronic hypotension - Creatinine 0.99- 1.01, stable on Lasix IV 120 mg twice a day. Continue. -Has midodrine 15 mg 3 times a day with meals to help support blood pressure -On sildenafil 40 mg 3 times daily for pulmonary hypertension.  Also on macitentan 10 mg a day. -Very appreciative of Dr. Haroldine Laws.  No other changes made today. High risk medications/extremely complex  medical history.  DNR/DNI  Coronary artery disease status post CABG in 1998 - Stable  AAA - Status post EVAR 2009.  Stable.  Bradycardia -  Previously declined pacemaker.  Prior GI bleed - Not on anticoagulation given his atrial fibrillation because of this.  Aortic stenosis - Not a TAVR candidate.  Clinically improved.  May just need 1 more day of IV Lasix and then hopeful discharge soon.  For questions or updates, please contact [place] Please consult [email or web address] for contact info under       Signed, Candee Furbish, MD  04/29/2022, 9:58 AM

## 2022-04-29 NOTE — Progress Notes (Addendum)
PROGRESS NOTE  Vincent Robinson  DGL:[id number] DOB: Mar 02, 1923 DOA: 04/27/2022 PCP: Katherina Mires, MD   Brief Narrative:  89/M with history of chronic diastolic CHF, pulmonary hypertension, cor pulmonale, COPD, CAD/CABG, interstitial lung disease, chronic hypoxic respiratory failure on 3 L home O2, paroxysmal A-fib, chronic hypotension on midodrine, hypothyroidism, abdominal aortic aneurysm status post endovascular repair, chronic cholecystitis with drain, bladder cancer, OSA on CPAP was brought to the ED after a fall, also reported history of progressive dyspnea on exertion .  In the ED he was noted to be profoundly hypoxic, volume overloaded, chest x-ray with cardiomegaly with pulmonary vascular congestion, started on diuretics and BiPAP, cardiology following  Assessment & Plan:  Acute on chronic hypoxic respiratory failure: On presentation, he was hypoxic, saturating 78% on 4 L of oxygen, on 3 L O2 at baseline, suspect hypoxia is multifactorial, from CHF, pulmonary hypertension and possibly progressive ILD -Still requiring 10 L HFNC, wean O2 down as tolerated -If significant hypoxemia persists will consider HRCT  Acute on chronic diastolic congestive heart failure Pulmonary hypertension,Cor Pulmonale -Last echo had shown EF of 60 to 65%, indeterminate diastolic parameters.  -On torsemide at baseline cardiology following, currently on high-dose IV Lasix 120 Mg twice daily, urine output is inaccurate however weight down 8 lbs -Cards following -GDMT limited by chronic hypotension -Remains on high-dose sildenafil and macitentan for PAH -Followed by advanced heart failure team at baseline  History of COPD/ILD: Follows with Dr. Vaughan Browner, pulmonology.  Continue bronchodilators as needed, supplemental oxygen -Will consider HRCT if hypoxia does not improve  Chronic hypotension: On high-dose midodrine at baseline, continued  Suspected UTI: UA showed large leukocytes.  Urine culture with 100,000  colonies, multiple species, day 2 of ceftriaxone  Coronary artery disease/elevated troponin:  -likely this is from demand ischemia from CHF exacerbation.  Paroxysmal A-fib: Currently rate is controlled.  Not on anticoagulant due to history of GI bleed.  History of fall: Fell at home.  PT/OT consulted.  X-ray of pelvis did not show any fracture or dislocation  Hypothyroidism: TSH is elevated will increase Synthroid dose from 88 to 100 mcg  Hypokalemia: Supplemented with potassium  Chronic cholecystitis, cholecystostomy drain: Says he had this for last 2 to 3 years.  [facility] every 2 months to change the bag.   -Routine drain care  History of abdominal aortic aneurysm: Status post endovascular stenting in 2009  OSA: Continue BiPAP at night, uses BiPAP at home  DVT prophylaxis:enoxaparin (LOVENOX) injection 40 mg Start: 04/27/22 1015   Code Status: DNR Family Communication: Discussed with patient in detail, no family at bedside  Consultants: Cardiology  Procedures: None  Antimicrobials:  Anti-infectives (From admission, onward)    Start     Dose/Rate Route Frequency Ordered Stop   04/27/22 0745  cefTRIAXone (ROCEPHIN) 1 g in sodium chloride 0.9 % 100 mL IVPB        1 g 200 mL/hr over 30 Minutes Intravenous Every 24 hours 04/27/22 0736         Subjective: Patient seen and examined, sitting up in bed, reports breathing better, did not realize that he is still on 10 L O2  Objective: Vitals:   04/28/22 1727 04/28/22 2318 04/29/22 0605 04/29/22 0902  BP:   98/65 (!) 92/54  Pulse:  60 60 82  Resp: '19 20 18 18  '$ Temp:   98 F (36.7 C) 98.1 F (36.7 C)  TempSrc:   Oral Oral  SpO2:  98% 100% 97%  Weight:   62.9 kg   Height:        Intake/Output Summary (Last 24 hours) at 04/29/2022 1320 Last data filed at 04/29/2022 0817 Gross per 24 hour  Intake 1067.86 ml  Output 1075 ml  Net -7.14 ml   Filed Weights   04/27/22 0144 04/28/22 0500 04/29/22 0605   Weight: 66.2 kg 63.5 kg 62.9 kg    Examination:  General exam: Pleasant elderly chronically ill male sitting up in bed, AAO x3, no distress HEENT: Positive JVD CVS: S1-S2, irregularly irregular rhythm, systolic murmur Lungs: Bilateral Rales Abdomen: Soft, nontender,, mildly distended Extremities: Trace edema  Data Reviewed: I have personally reviewed following labs and imaging studies  CBC: Recent Labs  Lab 04/27/22 0140 04/27/22 0147 04/28/22 0212 04/29/22 0207  WBC 11.4*  --  11.9* 10.3  NEUTROABS 8.2*  --   --   --   HGB 13.5 14.6 13.3 12.4*  HCT 40.0 43.0 39.7 36.9*  MCV 100.3*  --  100.0 101.1*  PLT 245  --  199 801   Basic Metabolic Panel: Recent Labs  Lab 04/27/22 0140 04/27/22 0147 04/28/22 0212 04/29/22 0207  NA 137 135 137 133*  K 4.2 5.1 3.4* 3.4*  CL 98 96* 97* 95*  CO2 27  --  29 27  GLUCOSE 92 95 103* 101*  BUN 21 30* 18 20  CREATININE 1.07 1.00 1.01 0.99  CALCIUM 8.6*  --  8.4* 8.0*  MG 2.1  --   --   --      Recent Results (from the past 240 hour(s))  Urine Culture     Status: Abnormal   Collection Time: 04/27/22  4:40 AM   Specimen: Urine, Clean Catch  Result Value Ref Range Status   Specimen Description URINE, CLEAN CATCH  Final   Special Requests   Final    NONE Performed at [facility], [place] [address], [place], [place] [address]    Culture (A)  Final    >=100,000 COLONIES/mL MULTIPLE SPECIES PRESENT, SUGGEST RECOLLECTION   Report Status 04/28/2022 FINAL  Final     Radiology Studies: No results found.  Scheduled Meds:  aspirin  324 mg Oral Once   enoxaparin (LOVENOX) injection  40 mg Subcutaneous Q24H   ferrous sulfate  325 mg Oral Q breakfast   levothyroxine  88 mcg Oral QAC breakfast   lidocaine  1 patch Transdermal Q24H   macitentan  10 mg Oral Daily   midodrine  15 mg Oral TID with meals   montelukast  10 mg Oral QHS   pantoprazole  20 mg Oral q morning   potassium chloride  20 mEq Oral Daily   sildenafil  40  mg Oral TID   simvastatin  10 mg Oral QHS   sodium chloride flush  3 mL Intravenous Q12H   Continuous Infusions:  cefTRIAXone (ROCEPHIN)  IV 1 g (04/29/22 1051)   furosemide 120 mg (04/29/22 1055)     LOS: 2 days   Domenic Polite, MD [facility] 04/29/2022, 1:20 PM

## 2022-04-29 NOTE — NC FL2 (Signed)
[place] LEVEL OF CARE SCREENING TOOL     IDENTIFICATION  Patient Name: Vincent Robinson Birthdate: 10-02-1923 Sex: male Admission Date (Current Location): 04/27/2022  [facility] and [place] Number:  Herbalist and Address:  [facility] [place], [place] [address] [place], [place] [address]      Provider Number: [id number]  Attending Physician Name and Address:  Domenic Polite, MD  Relative Name and Phone Number:  [name] Daughter [phone]    Current Level of Care: Hospital Recommended Level of Care: [place] Prior Approval Number:    Date Approved/Denied:   PASRR Number: [id number]  Discharge Plan: SNF    Current Diagnoses: Patient Active Problem List   Diagnosis Date Noted   Acute exacerbation of CHF (congestive heart failure) ([place]) 04/27/2022   PAH (pulmonary artery hypertension) ([place]) 04/27/2022   UTI (urinary tract infection) 04/27/2022   Leukocytosis 04/27/2022   Fall at home, initial encounter 04/27/2022   Symptomatic sinus bradycardia 09/06/2021   Near syncope 09/05/2021   Prolonged QT interval 07/09/2021   CHF exacerbation ([place]) 07/08/2021   Esophageal obstruction due to food impaction 07/08/2021   Acute on chronic diastolic CHF (congestive heart failure) ([place]) 07/08/2021   Malignant neoplasm of overlapping sites of bladder ([place]) 03/09/2021   Pneumonia due to COVID-19 virus 11/14/2020   Elevated troponin 11/14/2020   Bladder mass 11/14/2020   Acute cholecystitis 09/08/2020   Diverticular hemorrhage    Rectal bleeding 03/22/2020   Acute GI bleeding 03/22/2020   Hypokalemia    Chronic hypotension    Acute respiratory failure with hypoxia ([place]) 12/18/2019   Acute on chronic right-sided heart failure ([place]) 12/18/2019   Pressure injury of skin 11/24/2019   Chronic respiratory failure (HCC)    Acute on chronic respiratory failure with hypoxia (HCC) 11/23/2019   Right heart failure (HCC)    S/P  CABG (coronary artery bypass graft) 11/22/2019   Chest pain 11/22/2019   Atrial fibrillation ([place]) 08/04/2019   Pulmonary hypertension, unspecified ([place]) 10/11/2017   Aortic stenosis, moderate 10/11/2017   Coronary artery disease 08/30/2017   AAA (abdominal aortic aneurysm) without rupture 08/30/2017   Acute blood loss anemia 06/07/2017   Hemorrhagic shock (HCC) 06/07/2017   Lower GI bleed 06/05/2017   Sleep apnea 06/05/2017   Hypothyroidism 06/05/2017   Hyperlipidemia 06/05/2017   Asthma 06/05/2017   ILD (interstitial lung disease) ([place]) 05/28/2017   Pneumothorax on right 05/20/2017   COPD (chronic obstructive pulmonary disease) ([place]) 05/20/2017   Pneumothorax 05/20/2017   Skin cancer 10/23/2015    Orientation RESPIRATION BLADDER Height & Weight     Self, Time, Situation, Place  O2 External catheter, Incontinent Weight: 138 lb 10.7 oz (62.9 kg) Height:  '5\' 7"'$  (170.2 cm)  BEHAVIORAL SYMPTOMS/MOOD NEUROLOGICAL BOWEL NUTRITION STATUS      Continent Diet (see discharge summary)  AMBULATORY STATUS COMMUNICATION OF NEEDS Skin   Limited Assist Verbally Other (Comment) (ecchymosis)                       Personal Care Assistance Level of Assistance  Bathing, Feeding, Dressing Bathing Assistance: Limited assistance Feeding assistance: Independent Dressing Assistance: Limited assistance     Functional Limitations Info  Sight, Hearing, Speech Sight Info: Adequate Hearing Info: Adequate Speech Info: Adequate    SPECIAL CARE FACTORS FREQUENCY  PT (By licensed PT), OT (By licensed OT)     PT Frequency: 5x week OT Frequency: 5x week  Contractures Contractures Info: Not present    Additional Factors Info  Code Status, Allergies Code Status Info: DNR Allergies Info: Oxycodone, Hydrocodone           Current Medications (04/29/2022):  This is the current hospital active medication list Current Facility-Administered Medications  Medication Dose Route  Frequency Provider Last Rate Last Admin   acetaminophen (TYLENOL) tablet 650 mg  650 mg Oral Q6H PRN Norval Morton, MD       Or   acetaminophen (TYLENOL) suppository 650 mg  650 mg Rectal Q6H PRN Fuller Plan A, MD       aspirin chewable tablet 324 mg  324 mg Oral Once Mesner, Corene Cornea, MD       cefTRIAXone (ROCEPHIN) 1 g in sodium chloride 0.9 % 100 mL IVPB  1 g Intravenous Q24H Smith, Rondell A, MD 200 mL/hr at 04/29/22 1051 1 g at 04/29/22 1051   enoxaparin (LOVENOX) injection 40 mg  40 mg Subcutaneous Q24H Smith, Rondell A, MD   40 mg at 04/29/22 1048   ferrous sulfate tablet 325 mg  325 mg Oral Q breakfast Smith, Rondell A, MD   325 mg at 04/29/22 1045   furosemide (LASIX) 120 mg in dextrose 5 % 50 mL IVPB  120 mg Intravenous BID Chandrasekhar, Mahesh A, MD 62 mL/hr at 04/29/22 1055 120 mg at 04/29/22 1055   levalbuterol (XOPENEX) nebulizer solution 1.25 mg  1.25 mg Nebulization Q6H PRN Norval Morton, MD       [START ON 04/30/2022] levothyroxine (SYNTHROID) tablet 100 mcg  100 mcg Oral QAC breakfast Domenic Polite, MD       lidocaine (LIDODERM) 5 % 1 patch  1 patch Transdermal Q24H Tamala Julian, Rondell A, MD   1 patch at 04/29/22 1046   macitentan (OPSUMIT) tablet 10 mg  10 mg Oral Daily Paytes, Austin A, RPH   10 mg at 04/29/22 1046   midodrine (PROAMATINE) tablet 15 mg  15 mg Oral TID with meals Tamala Julian, Rondell A, MD   15 mg at 04/29/22 1328   montelukast (SINGULAIR) tablet 10 mg  10 mg Oral QHS Smith, Rondell A, MD   10 mg at 04/28/22 2243   pantoprazole (PROTONIX) EC tablet 20 mg  20 mg Oral q morning Smith, Rondell A, MD   20 mg at 04/29/22 1046   potassium chloride (KLOR-CON) CR tablet 20 mEq  20 mEq Oral Daily Smith, Rondell A, MD   20 mEq at 04/29/22 1045   sildenafil (REVATIO) tablet 40 mg  40 mg Oral TID Fuller Plan A, MD   40 mg at 04/29/22 1045   simvastatin (ZOCOR) tablet 10 mg  10 mg Oral QHS Smith, Rondell A, MD   10 mg at 04/28/22 2243   sodium chloride flush (NS) 0.9 %  injection 3 mL  3 mL Intravenous Q12H Smith, Rondell A, MD   3 mL at 04/29/22 1000   Facility-Administered Medications Ordered in Other Encounters  Medication Dose Route Frequency Provider Last Rate Last Admin   gemcitabine (GEMZAR) chemo syringe for bladder instillation 2,000 mg  2,000 mg Bladder Instillation Once Franchot Gallo, MD         Discharge Medications: Please see discharge summary for a list of discharge medications.  Relevant Imaging Results:  Relevant Lab Results:   Additional Information SSN [id number]  Rose Fillers, Veronia Beets, [place]

## 2022-04-30 ENCOUNTER — Telehealth: Payer: Self-pay | Admitting: Physician Assistant

## 2022-04-30 ENCOUNTER — Inpatient Hospital Stay (HOSPITAL_COMMUNITY): Payer: Medicare Other

## 2022-04-30 DIAGNOSIS — I482 Chronic atrial fibrillation, unspecified: Secondary | ICD-10-CM | POA: Diagnosis not present

## 2022-04-30 DIAGNOSIS — I4821 Permanent atrial fibrillation: Secondary | ICD-10-CM | POA: Diagnosis not present

## 2022-04-30 LAB — BASIC METABOLIC PANEL
Anion gap: 13 (ref 5–15)
BUN: 27 mg/dL — ABNORMAL HIGH (ref 8–23)
CO2: 27 mmol/L (ref 22–32)
Calcium: 8.1 mg/dL — ABNORMAL LOW (ref 8.9–10.3)
Chloride: 95 mmol/L — ABNORMAL LOW (ref 98–111)
Creatinine, Ser: 1.22 mg/dL (ref 0.61–1.24)
GFR, Estimated: 54 mL/min — ABNORMAL LOW (ref >60)
Glucose, Bld: 94 mg/dL (ref 70–99)
Potassium: 3.9 mmol/L (ref 3.5–5.1)
Sodium: 135 mmol/L (ref 135–145)

## 2022-04-30 LAB — GLUCOSE, CAPILLARY: Glucose-Capillary: 88 mg/dL (ref 70–99)

## 2022-04-30 MED ORDER — AZITHROMYCIN 250 MG PO TABS
250.0000 mg | ORAL_TABLET | Freq: Every day | ORAL | Status: AC
  Administered 2022-05-01 – 2022-05-04 (×4): 250 mg via ORAL
  Filled 2022-04-30 (×4): qty 1

## 2022-04-30 MED ORDER — REVEFENACIN 175 MCG/3ML IN SOLN
175.0000 ug | Freq: Every day | RESPIRATORY_TRACT | Status: DC
  Administered 2022-05-01 – 2022-05-07 (×5): 175 ug via RESPIRATORY_TRACT
  Filled 2022-04-30 (×9): qty 3

## 2022-04-30 MED ORDER — ARFORMOTEROL TARTRATE 15 MCG/2ML IN NEBU
15.0000 ug | INHALATION_SOLUTION | Freq: Two times a day (BID) | RESPIRATORY_TRACT | Status: DC
  Administered 2022-04-30 – 2022-05-07 (×12): 15 ug via RESPIRATORY_TRACT
  Filled 2022-04-30 (×14): qty 2

## 2022-04-30 MED ORDER — BUDESONIDE 0.25 MG/2ML IN SUSP
0.2500 mg | Freq: Two times a day (BID) | RESPIRATORY_TRACT | Status: DC
  Administered 2022-04-30 – 2022-05-07 (×12): 0.25 mg via RESPIRATORY_TRACT
  Filled 2022-04-30 (×14): qty 2

## 2022-04-30 MED ORDER — AZITHROMYCIN 250 MG PO TABS
500.0000 mg | ORAL_TABLET | Freq: Every day | ORAL | Status: AC
  Administered 2022-04-30: 500 mg via ORAL
  Filled 2022-04-30: qty 2

## 2022-04-30 NOTE — Progress Notes (Addendum)
PROGRESS NOTE  Vincent Robinson  NAT:[id number] DOB: 02-Sep-1923 DOA: 04/27/2022 PCP: Katherina Mires, MD   Brief Narrative:  89/M with history of chronic diastolic CHF, pulmonary hypertension, cor pulmonale, COPD, CAD/CABG, interstitial lung disease, chronic hypoxic respiratory failure on 3 L home O2, paroxysmal A-fib, chronic hypotension on midodrine, hypothyroidism, abdominal aortic aneurysm status post endovascular repair, chronic cholecystitis with drain, bladder cancer, OSA on CPAP was brought to the ED after a fall, also reported history of progressive dyspnea on exertion .  In the ED he was noted to be profoundly hypoxic, volume overloaded, chest x-ray with cardiomegaly with pulmonary vascular congestion, started on diuretics and BiPAP, cardiology following  Assessment & Plan:  Acute on chronic hypoxic respiratory failure: On presentation, he was hypoxic, saturating 78% on 4 L of oxygen, on 3 L home O2 at baseline, suspect hypoxia is multifactorial, from CHF, pulmonary hypertension and possibly progressive ILD -Still requiring 9-10 L HFNC, wean O2 down as tolerated -Cards following, diuresed with 3 days, weight down, urine output has been inaccurate, appears euvolemic -With persistent hypoxia will obtain HRCT, may need pulmonary eval  Acute on chronic diastolic congestive heart failure Pulmonary hypertension,Cor Pulmonale -Last echo had shown EF of 60 to 65%, indeterminate diastolic parameters.  -On torsemide at baseline cardiology following, currently on high-dose IV Lasix 120 Mg twice daily, urine output is inaccurate however weight down 5 lbs -Cards following, appears euvolemic -GDMT limited by chronic hypotension, on max dose midodrine -Remains on high-dose sildenafil and macitentan for PAH -Followed by advanced heart failure team at baseline  Chronic hypotension: On high-dose midodrine at baseline, continued  Suspected UTI: UA showed large leukocytes.  Urine culture with 100,000  colonies, multiple species, day 3 of ceftriaxone, stop after today's dose  Coronary artery disease/elevated troponin:  -likely this is from demand ischemia from CHF exacerbation.  Paroxysmal A-fib: Currently rate is controlled.  Not on anticoagulant due to history of GI bleed.  History of fall: Fell at home.  PT/OT consulted.  X-ray of pelvis did not show any fracture or dislocation  Hypothyroidism: TSH is elevated will increase Synthroid dose from 88 to 100 mcg  Hypokalemia: Supplemented with potassium  Chronic cholecystitis, cholecystostomy drain: Says he had this for last 2 to 3 years.  [facility] every 2 months to change the bag.   -Routine drain care  History of abdominal aortic aneurysm: Status post endovascular stenting in 2009  OSA: Continue BiPAP at night, uses BiPAP at home  DVT prophylaxis:enoxaparin (LOVENOX) injection 40 mg Start: 04/27/22 1015   Code Status: DNR Family Communication: Discussed with patient in detail, no family at bedside, will update daughter  Consultants: Cardiology  Procedures: None  Antimicrobials:  Anti-infectives (From admission, onward)    Start     Dose/Rate Route Frequency Ordered Stop   04/27/22 0745  cefTRIAXone (ROCEPHIN) 1 g in sodium chloride 0.9 % 100 mL IVPB        1 g 200 mL/hr over 30 Minutes Intravenous Every 24 hours 04/27/22 0736         Subjective: Patient seen and examined, sitting up in bed, reports breathing better, did not realize that he is still on 10 L O2  Objective: Vitals:   04/30/22 0500 04/30/22 0757 04/30/22 0934 04/30/22 1301  BP:    (!) 95/56  Pulse:  78  68  Resp:  17    Temp:   98 F (36.7 C) (!) 97.5 F (36.4 C)  TempSrc:  Oral Oral  SpO2:  98%  92%  Weight: 61.3 kg     Height:        Intake/Output Summary (Last 24 hours) at 04/30/2022 1424 Last data filed at 04/30/2022 1303 Gross per 24 hour  Intake 455 ml  Output 1177 ml  Net -722 ml   Filed Weights   04/28/22 0500  04/29/22 0605 04/30/22 0500  Weight: 63.5 kg 62.9 kg 61.3 kg    Examination:  General exam: Pleasant elderly chronically ill male sitting up in bed, AAO x3, no distress HEENT: No JVD CVS: S1-S2, irregularly irregular rhythm, systolic murmur Lungs: Bilateral Rales Abdomen: Soft, nontender,, mildly distended Extremities: No edema  Data Reviewed: I have personally reviewed following labs and imaging studies  CBC: Recent Labs  Lab 04/27/22 0140 04/27/22 0147 04/28/22 0212 04/29/22 0207  WBC 11.4*  --  11.9* 10.3  NEUTROABS 8.2*  --   --   --   HGB 13.5 14.6 13.3 12.4*  HCT 40.0 43.0 39.7 36.9*  MCV 100.3*  --  100.0 101.1*  PLT 245  --  199 734   Basic Metabolic Panel: Recent Labs  Lab 04/27/22 0140 04/27/22 0147 04/28/22 0212 04/29/22 0207 04/30/22 0346  NA 137 135 137 133* 135  K 4.2 5.1 3.4* 3.4* 3.9  CL 98 96* 97* 95* 95*  CO2 27  --  '29 27 27  '$ GLUCOSE 92 95 103* 101* 94  BUN 21 30* 18 20 27*  CREATININE 1.07 1.00 1.01 0.99 1.22  CALCIUM 8.6*  --  8.4* 8.0* 8.1*  MG 2.1  --   --   --   --      Recent Results (from the past 240 hour(s))  Urine Culture     Status: Abnormal   Collection Time: 04/27/22  4:40 AM   Specimen: Urine, Clean Catch  Result Value Ref Range Status   Specimen Description URINE, CLEAN CATCH  Final   Special Requests   Final    NONE Performed at [facility], [place] [address], [place],  [address]    Culture (A)  Final    >=100,000 COLONIES/mL MULTIPLE SPECIES PRESENT, SUGGEST RECOLLECTION   Report Status 04/28/2022 FINAL  Final     Radiology Studies: CT Chest High Resolution  Result Date: 04/30/2022 CLINICAL DATA:  Hypoxemia EXAM: CT CHEST WITHOUT CONTRAST TECHNIQUE: Multidetector CT imaging of the chest was performed following the standard protocol without intravenous contrast. High resolution imaging of the lungs, as well as inspiratory and expiratory imaging, was performed. RADIATION DOSE REDUCTION: This exam was  performed according to the departmental dose-optimization program which includes automated exposure control, adjustment of the mA and/or kV according to patient size and/or use of iterative reconstruction technique. COMPARISON:  Chest CT dated February 05, 2022 FINDINGS: Cardiovascular: Cardiomegaly. No pericardial effusion. Coronary artery calcifications. Severe left main and three-vessel coronary artery calcifications. Mediastinum/Nodes: Esophagus and thyroid are unremarkable. Unchanged mildly enlarged mediastinal lymph nodes, likely reactive. Lungs/Pleura: Central airways are patent. Mild centrilobular emphysema. Bilateral subpleural predominant reticular opacities with mild basilar predominance and associated traction bronchiectasis. Right lower lobe juxtapleural solid nodule which was new on prior exam is not well visualized due to motion artifact. Stable right upper lobe subpleural consolidation with associated calcifications, likely due to scarring. No evidence of pneumothorax. New small right pleural effusion. Upper Abdomen: No acute abnormality. Musculoskeletal: No aggressive appearing osseous lesions. Prior median sternotomy with intact sternal wires. IMPRESSION: 1. Stable fibrotic interstitial lung disease with no  evidence of acute airspace opacity, although evaluation is somewhat limited due to respiratory motion artifact. Findings are categorized as probable UIP per consensus guidelines: Diagnosis of Idiopathic Pulmonary Fibrosis: An Official ATS/ERS/JRS/ALAT Clinical Practice Guideline. [place], Iss 5, [phone], Jun 22 2017. 2. Juxtapleural solid pulmonary nodule of the right upper lobe which was described on most recent prior exam is not well visualized due to respiratory motion artifact. See prior exam for follow-up recommendations. 3. New small right pleural effusion. 4. Cardiomegaly, aortic Atherosclerosis (ICD10-I70.0) and Emphysema (ICD10-J43.9). Electronically Signed   By:  Yetta Glassman M.D.   On: 04/30/2022 12:14    Scheduled Meds:  aspirin  324 mg Oral Once   enoxaparin (LOVENOX) injection  40 mg Subcutaneous Q24H   ferrous sulfate  325 mg Oral Q breakfast   levothyroxine  100 mcg Oral QAC breakfast   lidocaine  1 patch Transdermal Q24H   macitentan  10 mg Oral Daily   midodrine  15 mg Oral TID with meals   montelukast  10 mg Oral QHS   pantoprazole  20 mg Oral q morning   potassium chloride  20 mEq Oral Daily   sildenafil  40 mg Oral TID   simvastatin  10 mg Oral QHS   sodium chloride flush  3 mL Intravenous Q12H   Continuous Infusions:  cefTRIAXone (ROCEPHIN)  IV 1 g (04/30/22 7414)   furosemide 120 mg (04/30/22 0936)     LOS: 3 days   Domenic Polite, MD [facility] 04/30/2022, 2:24 PM

## 2022-04-30 NOTE — Plan of Care (Signed)
  Problem: Nutrition: Goal: Adequate nutrition will be maintained Outcome: Completed/Met   Problem: Pain Managment: Goal: General experience of comfort will improve Outcome: Completed/Met

## 2022-04-30 NOTE — Progress Notes (Signed)
Patient BP low 87/50, asymptomatic, midodrine given per order, MD aware. Dr. Haroldine Laws okay to give lasix.

## 2022-04-30 NOTE — TOC Progression Note (Signed)
Transition of Care [facility]) - Progression Note    Patient Details  Name: Vincent Robinson MRN: [id number] Date of Birth: Dec 07, 1922  Transition of [facility]) CM/SW [place], [place] Phone Number: 04/30/2022, 2:28 PM  Clinical Narrative:     Family desires pt go to [facility] per chart review. CSW contacted [facility]; clinically they can accept pt but will have to determine bed availability closer to DC. TOC will continue to follow.     Expected Discharge Plan: [place] Barriers to Discharge: Continued Medical Work up  Expected Discharge Plan and Services Expected Discharge Plan: [place] In-house Referral: Clinical Social Work Discharge Planning Services: CM Consult Post Acute Care Choice: [place] arrangements for the past 2 months: Apartment                                       Social Determinants of Health (SDOH) Interventions    Readmission Risk Interventions    07/11/2021   12:38 PM 09/19/2020    3:18 PM 03/25/2020   11:02 AM  Readmission Risk Prevention Plan  Transportation Screening Complete Complete Complete  HRI or Home Care Consult  Complete Patient refused  Social Work Consult for [place] Planning/Counseling  Complete   Palliative Care Screening  Complete Not Applicable  Medication Review Press photographer) Complete Complete Complete  PCP or Specialist appointment within 3-5 days of discharge Complete    HRI or [place] Complete    SW Recovery Care/Counseling Consult Complete    [place] Not Applicable

## 2022-04-30 NOTE — Plan of Care (Signed)
  Problem: Safety: Goal: Ability to remain free from injury will improve Outcome: Progressing   

## 2022-04-30 NOTE — Consult Note (Signed)
NAME:  Vincent Robinson, MRN:  [id number], DOB:  10-08-1923, LOS: 3 ADMISSION DATE:  04/27/2022, CONSULTATION DATE:  04/30/22 REFERRING MD:  Verdell Carmine, CHIEF COMPLAINT:  shortness of breath  History of Present Illness:  Vincent Robinson is a 88 y.o. M with PMH significant for COPD on home O2, CAD,  OSA on Bipap, HFpEF, pulmonary HTN, post-inflammatory lung fibrosis and atrial fibrillation followed by Dr. Vaughan Browner who presented initially after a mechanical fall at home. He was short of breath and admission and work-up consistent with pulmonary edema.  He was admitted and seen by the heart failure team and diuresed.  He is on 3L baseline home O2, oxygen requirements remained above baseline at 9L after diuresis, so PCCM consulted.   Pt is sitting up in bed and states that he is not feeling significantly short of breath.  He has occasionally tightness across the chest and has been coughing up green/whitish sputum.  Denies fever or wheezing.   Pertinent  Medical History   has a past medical history of AAA (abdominal aortic aneurysm) ([place]), Acute respiratory failure ([place]) (11/23/2019), Asthma, Basal cell carcinoma, CHF (congestive heart failure) ([place]), Chronic respiratory failure ([place]), COPD (chronic obstructive pulmonary disease) ([place]), Coronary artery disease, Diverticulitis, Dysrhythmia, History of chronic respiratory failure, Hyperlipidemia, Hypothyroidism, Moderate aortic stenosis, Myocardial infarction ([place]), Pneumonia, Pulmonary HTN ([place]), Right heart failure ([place]), Skin cancer (2017), and Sleep apnea.   Significant Hospital Events: Including procedures, antibiotic start and stop dates in addition to other pertinent events   7/7 admit after mechanical fall with shortness of breath 7/10 remains on 9L o2, HRCT obtained and shows stable ILD and small R pleural effusion  Interim History / Subjective:  As above  Objective   Blood pressure (!) 95/56, pulse 68, temperature (!) 97.5 F (36.4 C), temperature  source Oral, resp. rate 17, height '5\' 7"'$  (1.702 m), weight 61.3 kg, SpO2 92 %.    FiO2 (%):  [50 %] 50 %   Intake/Output Summary (Last 24 hours) at 04/30/2022 1451 Last data filed at 04/30/2022 1303 Gross per 24 hour  Intake 455 ml  Output 1177 ml  Net -722 ml   Filed Weights   04/28/22 0500 04/29/22 0605 04/30/22 0500  Weight: 63.5 kg 62.9 kg 61.3 kg    General:  elderly M, sitting up in bed awake and in no distress HEENT: MM pink/moist, [place] in place, sclera anicteric  Neuro: awake, alert and oriented, following commands, non-focal CV: s1s2 rrr, no m/r/g PULM:  good air movement, no wheezing or rhonchi, no distress, transiently hypoxic after getting up to the bathroom GI: soft, non-tender Extremities: warm/dry, no edema  Skin: no rashes or lesions   Resolved Hospital Problem list     Assessment & Plan:    Acute on Chronic hypoxic respiratory failure in the setting of mechanical fall and likely secondary to volume overload in the setting of baseline IPF, OSA and COPD O2 requirement is slowly decreasing, down from 15L 2 days ago, suspect combination of volume overload and possible mild COPD exacerbation, improving slowly -will start Brovana, Pulmicort and Yupelri -continue ceftriaxone and diuresis per HF team -encourage IS and mobilization -will request outpatient follow up with Dr. Vaughan Browner -thank you for this consult we, will continue to follow along with you    Best Practice (right click and "Reselect all SmartList Selections" daily)   Per primary  Labs   CBC: Recent Labs  Lab 04/27/22 0140 04/27/22 0147 04/28/22 0212 04/29/22 0207  WBC 11.4*  --  11.9* 10.3  NEUTROABS 8.2*  --   --   --   HGB 13.5 14.6 13.3 12.4*  HCT 40.0 43.0 39.7 36.9*  MCV 100.3*  --  100.0 101.1*  PLT 245  --  199 161    Basic Metabolic Panel: Recent Labs  Lab 04/27/22 0140 04/27/22 0147 04/28/22 0212 04/29/22 0207 04/30/22 0346  NA 137 135 137 133* 135  K 4.2 5.1 3.4* 3.4*  3.9  CL 98 96* 97* 95* 95*  CO2 27  --  '29 27 27  '$ GLUCOSE 92 95 103* 101* 94  BUN 21 30* 18 20 27*  CREATININE 1.07 1.00 1.01 0.99 1.22  CALCIUM 8.6*  --  8.4* 8.0* 8.1*  MG 2.1  --   --   --   --    GFR: Estimated Creatinine Clearance: 30.7 mL/min (by C-G formula based on SCr of 1.22 mg/dL). Recent Labs  Lab 04/27/22 0140 04/27/22 1309 04/28/22 0212 04/29/22 0207  PROCALCITON  --  <0.10  --   --   WBC 11.4*  --  11.9* 10.3    Liver Function Tests: Recent Labs  Lab 04/27/22 0140 04/28/22 0212  AST 43* 23  ALT 23 16  ALKPHOS 70 66  BILITOT 1.4* 1.4*  PROT 8.0 7.3  ALBUMIN 3.4* 2.9*   Recent Labs  Lab 04/27/22 0140  LIPASE 59*   No results for input(s): "AMMONIA" in the last 168 hours.  ABG    Component Value Date/Time   PHART 7.467 (H) 12/18/2019 1726   PCO2ART 38.8 12/18/2019 1726   PO2ART 70.3 (L) 12/18/2019 1726   HCO3 27.8 12/18/2019 1726   TCO2 31 04/27/2022 0147   O2SAT 97.9 09/16/2020 1337     Coagulation Profile: No results for input(s): "INR", "PROTIME" in the last 168 hours.  Cardiac Enzymes: No results for input(s): "CKTOTAL", "CKMB", "CKMBINDEX", "TROPONINI" in the last 168 hours.  HbA1C: Hgb A1c MFr Bld  Date/Time Value Ref Range Status  09/08/2020 07:40 AM 5.2 4.8 - 5.6 % Final    Comment:    (NOTE) Pre diabetes:          5.7%-6.4%  Diabetes:              >6.4%  Glycemic control for   <7.0% adults with diabetes     CBG: Recent Labs  Lab 04/30/22 0609  GLUCAP 88    Review of Systems:   Please see the history of present illness. All other systems reviewed and are negative    Past Medical History:  He,  has a past medical history of AAA (abdominal aortic aneurysm) ([place]), Acute respiratory failure ([place]) (11/23/2019), Asthma, Basal cell carcinoma, CHF (congestive heart failure) ([place]), Chronic respiratory failure ([place]), COPD (chronic obstructive pulmonary disease) ([place]), Coronary artery disease, Diverticulitis, Dysrhythmia,  History of chronic respiratory failure, Hyperlipidemia, Hypothyroidism, Moderate aortic stenosis, Myocardial infarction ([place]), Pneumonia, Pulmonary HTN ([place]), Right heart failure ([place]), Skin cancer (2017), and Sleep apnea.   Surgical History:   Past Surgical History:  Procedure Laterality Date   bleeding ulcer  2016   BYPASS AXILLA/BRACHIAL ARTERY  1998   colon bleed  2012   COLONOSCOPY N/A 06/07/2017   Procedure: COLONOSCOPY;  Surgeon: Carol Ada, MD;  Location: [facility];  Service: Endoscopy;  Laterality: N/A;   CORONARY ARTERY BYPASS GRAFT     5 vessel   DEBRIDMENT OF DECUBITUS ULCER  2002   EYE SURGERY     cataract   IR ANGIOGRAM SELECTIVE EACH ADDITIONAL  VESSEL  03/22/2020   IR ANGIOGRAM VISCERAL SELECTIVE  03/22/2020   IR EXCHANGE BILIARY DRAIN  12/02/2020   IR EXCHANGE BILIARY DRAIN  02/08/2021   IR EXCHANGE BILIARY DRAIN  04/05/2021   IR EXCHANGE BILIARY DRAIN  06/05/2021   IR EXCHANGE BILIARY DRAIN  07/31/2021   IR EXCHANGE BILIARY DRAIN  09/25/2021   IR EXCHANGE BILIARY DRAIN  11/20/2021   IR EXCHANGE BILIARY DRAIN  01/15/2022   IR EXCHANGE BILIARY DRAIN  03/12/2022   IR PERC CHOLECYSTOSTOMY  09/08/2020   IR RADIOLOGIST EVAL & MGMT  10/06/2020   IR RADIOLOGIST EVAL & MGMT  10/26/2020   IR US GUIDE VASC ACCESS RIGHT  03/22/2020   RIGHT HEART CATH N/A 12/11/2017   Procedure: RIGHT HEART CATH;  Surgeon: Jolaine Artist, MD;  Location: [facility];  Service: Cardiovascular;  Laterality: N/A;   THORACIC AORTA STENT  2009   TRANSURETHRAL RESECTION OF BLADDER TUMOR WITH MITOMYCIN-C N/A 03/09/2021   Procedure: TRANSURETHRAL RESECTION OF BLADDER TUMOR;  Surgeon: Franchot Gallo, MD;  Location: [facility];  Service: Urology;  Laterality: N/A;     Social History:   reports that he quit smoking about 60 years ago. His smoking use included cigarettes. He smoked an average of .5 packs per day. He has never used smokeless tobacco. He reports that he does not currently use alcohol. He  reports that he does not use drugs.   Family History:  His family history includes Aneurysm in his brother; Emphysema in his mother; Macular degeneration in his brother; Movement disorder in his sister; Stroke in his father.   Allergies Allergies  Allergen Reactions   Oxycodone     Delusions (intolerance), Hallucinations    Hydrocodone     Daughter describes his reaction as "being out of it, unable to tolerate it, unable function"     Home Medications  Prior to Admission medications   Medication Sig Start Date End Date Taking? Authorizing Provider  albuterol (VENTOLIN HFA) 108 (90 Base) MCG/ACT inhaler Inhale 1 puff into the lungs every 6 (six) hours as needed for wheezing or shortness of breath.   Yes [provider]  ferrous sulfate 325 (65 FE) MG tablet Take 325 mg by mouth daily with breakfast.   Yes [provider]  levothyroxine (SYNTHROID) 88 MCG tablet Take 88 mcg by mouth daily before breakfast.   Yes [provider]  midodrine (PROAMATINE) 10 MG tablet TAKE ONE TABLET BY MOUTH THREE TIMES A DAY AFTER MEALS - TAKE IN ADDITION TO '5MG'$  TABLETS TO EQUAL '15MG'$  THREE TIMES A DAY Patient taking differently: Take 10 mg by mouth 3 (three) times daily. (Take with '5mg'$  to equal a total dose of '15mg'$ ) 02/22/22  Yes Bensimhon, Shaune Pascal, MD  midodrine (PROAMATINE) 5 MG tablet Take 5 mg by mouth 3 (three) times daily with meals. (Take with '10mg'$  to equal a total dose of '15mg'$ )   Yes [provider]  montelukast (SINGULAIR) 10 MG tablet Take 10 mg by mouth at bedtime.   Yes [provider]  OPSUMIT 10 MG tablet TAKE 1 TABLET BY MOUTH ONCE DAILY Patient taking differently: Take 10 mg by mouth daily. 03/02/22  Yes Bensimhon, Shaune Pascal, MD  pantoprazole (PROTONIX) 20 MG tablet Take 20 mg by mouth every morning.   Yes [provider]  potassium chloride (KLOR-CON) 10 MEQ tablet Take 20 mEq by mouth daily.   Yes [provider]  sildenafil  (REVATIO) 20 MG tablet TAKE TWO  TABLETS BY MOUTH THREE TIMES A DAY Patient taking differently: Take 40 mg by mouth 3 (three) times daily. 04/19/22  Yes Bensimhon, Shaune Pascal, MD  simvastatin (ZOCOR) 10 MG tablet Take 10 mg by mouth at bedtime.   Yes [provider]  Sodium Chloride Flush (NORMAL SALINE FLUSH) 0.9 % SOLN Use as directed Patient taking differently: Flush gall bladder drain twice weekly.  (Drain is changed every 6 to 8 weeks) 12/04/21  Yes Sandi Mariscal, MD  torsemide (DEMADEX) 20 MG tablet Take 3 tablets (60 mg total) by mouth daily AND 2 tablets (40 mg total) daily. ALTERNATING. Patient taking differently: Take '60mg'$  by mouth once daily. 02/08/22  Yes Rafael Bihari, FNP     Critical care time: n/a    Otilio Carpen Jesselyn Rask, PA-C [facility] See Amion for pager If no response to pager , please call [phone] until 7pm After 7:00 pm call Elink  [phone]?[place]

## 2022-04-30 NOTE — Progress Notes (Addendum)
Progress Note  Patient Name: Vincent Robinson Date of Encounter: 04/30/2022  [facility] Cardiologist: Quay Burow, MD   Subjective   Breathing improving.  Still requires 10 L of oxygen, down from 15 L on Friday.  No chest pain.  Inpatient Medications    Scheduled Meds:  aspirin  324 mg Oral Once   enoxaparin (LOVENOX) injection  40 mg Subcutaneous Q24H   ferrous sulfate  325 mg Oral Q breakfast   levothyroxine  100 mcg Oral QAC breakfast   lidocaine  1 patch Transdermal Q24H   macitentan  10 mg Oral Daily   midodrine  15 mg Oral TID with meals   montelukast  10 mg Oral QHS   pantoprazole  20 mg Oral q morning   potassium chloride  20 mEq Oral Daily   sildenafil  40 mg Oral TID   simvastatin  10 mg Oral QHS   sodium chloride flush  3 mL Intravenous Q12H   Continuous Infusions:  cefTRIAXone (ROCEPHIN)  IV 1 g (04/29/22 1051)   furosemide Stopped (04/29/22 1712)   PRN Meds: acetaminophen **OR** acetaminophen, levalbuterol   Vital Signs    Vitals:   04/30/22 0047 04/30/22 0359 04/30/22 0500 04/30/22 0757  BP: (!) 95/56 (!) 89/58    Pulse: (!) 59 64  78  Resp:  20  17  Temp:  97.6 F (36.4 C)    TempSrc:  Oral    SpO2:  98%  98%  Weight:   61.3 kg   Height:        Intake/Output Summary (Last 24 hours) at 04/30/2022 0827 Last data filed at 04/30/2022 0700 Gross per 24 hour  Intake 275 ml  Output 625 ml  Net -350 ml      04/30/2022    5:00 AM 04/29/2022    6:05 AM 04/28/2022    5:00 AM  Last 3 Weights  Weight (lbs) 135 lb 2.3 oz 138 lb 10.7 oz 139 lb 15.9 oz  Weight (kg) 61.3 kg 62.9 kg 63.5 kg      Telemetry    Atrial fibrillation with a slow rate- Personally Reviewed  ECG  N/A  Physical Exam   GEN: No acute distress.   Neck: No JVD Cardiac: Ir IR , +  murmurs, rubs, or gallops.  Respiratory: Diminished breath sound  GI: Soft, nontender, non-distended  MS: No edema; No deformity. Neuro:  Nonfocal  Psych: Normal affect   Labs    High  Sensitivity Troponin:   Recent Labs  Lab 04/27/22 0140 04/27/22 0437 04/27/22 1309  TROPONINIHS 27* 30* 37*     Chemistry Recent Labs  Lab 04/27/22 0140 04/27/22 0147 04/28/22 0212 04/29/22 0207 04/30/22 0346  NA 137   < > 137 133* 135  K 4.2   < > 3.4* 3.4* 3.9  CL 98   < > 97* 95* 95*  CO2 27  --  '29 27 27  '$ GLUCOSE 92   < > 103* 101* 94  BUN 21   < > 18 20 27*  CREATININE 1.07   < > 1.01 0.99 1.22  CALCIUM 8.6*  --  8.4* 8.0* 8.1*  MG 2.1  --   --   --   --   PROT 8.0  --  7.3  --   --   ALBUMIN 3.4*  --  2.9*  --   --   AST 43*  --  23  --   --   ALT 23  --  16  --   --   ALKPHOS 70  --  66  --   --   BILITOT 1.4*  --  1.4*  --   --   GFRNONAA >60  --  >60 >60 54*  ANIONGAP 12  --  '11 11 13   '$ < > = values in this interval not displayed.    Lipids No results for input(s): "CHOL", "TRIG", "HDL", "LABVLDL", "LDLCALC", "CHOLHDL" in the last 168 hours.  Hematology Recent Labs  Lab 04/27/22 0140 04/27/22 0147 04/28/22 0212 04/29/22 0207  WBC 11.4*  --  11.9* 10.3  RBC 3.99*  --  3.97* 3.65*  HGB 13.5 14.6 13.3 12.4*  HCT 40.0 43.0 39.7 36.9*  MCV 100.3*  --  100.0 101.1*  MCH 33.8  --  33.5 34.0  MCHC 33.8  --  33.5 33.6  RDW 14.3  --  14.3 14.1  PLT 245  --  199 167   Thyroid  Recent Labs  Lab 04/27/22 0140  TSH 3.573    BNP Recent Labs  Lab 04/27/22 0140  BNP 258.1*    DDimer No results for input(s): "DDIMER" in the last 168 hours.   Radiology    No results found.  Cardiac Studies   None this admission  Patient Profile     88 y.o. male with a hx of CAD s/p CABG in 1998, AAA s/p EVAR 10/09, pulmonary hypertension with cor pulmonale, chronic hypoxic respiratory failure on 3 L oxygen during daytime and BiPAP at night due to COPD, symptomatic bradycardia (declined pacemaker), paroxysmal atrial fibrillation (not on anticoagulation due to prior GI bleed) , obstructive sleep apnea and aortic stenosis who is being seen 04/27/2022 for the evaluation of  CHF   Assessment & Plan    1.  Acute on chronic hypoxic respiratory failure 2.  Pulmonary hypertension with cor pulmonale 3.  COPD/Interstitial lung disease -Initially required BiPAP and high flow oxygen upon arrival.  His symptoms has improved.  No net  I&O recorded however weight down 11 pounds since admit (146>>135lb). -He is still requiring 10 L of oxygen.  He appears close to euvolemic.  Continue IV Lasix 120 mg twice daily.  Will review changed to p.o. with MD. -Continue midodrine 15 mg 3 times daily for blood pressure support (new this admission) - Continue Sildenafil and Macitentan for pHTN  4.  Bradycardia Declined pacemaker.  No significant pauses or bradycardia.  5.  Persistent atrial fibrillation -Not on anticoagulation due to prior GI bleed  For questions or updates, please contact [place] Please consult [email or web address] for contact info under        Signed, Leanor Kail, PA  04/30/2022, 8:27 AM     Patient examined chart reviewed Exam with elderly kyphotic male Lungs with rhonchi/ILD no active wheezing , SEM no edema Has percutaneous drainage bag in GB.  Afib rates in 80-90 range sitting on commode no symptomatic pauses Cardiac status stable   Jenkins Rouge MD [facility]

## 2022-04-30 NOTE — Progress Notes (Signed)
Patient have skin tear to L.arm while getting on BSC, foam dressing applied. Family and MD notified.

## 2022-04-30 NOTE — Progress Notes (Signed)
PT Cancellation Note  Patient Details Name: SAYVION VIGEN MRN: [id number] DOB: 10/13/1923   Cancelled Treatment:    Reason Eval/Treat Not Completed: Patient at procedure or test/unavailable. Will check back as time allows.   Leighton Roach, PT  Acute Rehab Services Secure chat preferred Office [place] 04/30/2022, 11:38 AM

## 2022-04-30 NOTE — Progress Notes (Signed)
Physical Therapy Treatment Patient Details Name: Vincent Robinson MRN: [id number] DOB: 09-May-1923 Today's Date: 04/30/2022   History of Present Illness Pt is 88 yo male who presents to [facility] on 04/[facility]/22 with CHF exacerbation after a fall at ALF in bathroom. SpO2 was 78% at the time.  PMH: CAD s/p CABG 1998, AAA s/p EVAR 10/09, pulmonary HTN with cor pulmonale, chronic respiratory failure on 3LO2 day and Bipap at night. COPD. bradycardia, paroxysmal Afib, GI bleed, OSA, aortic stenosis, bladder cancer with chronic condom cath.    PT Comments    Pt reports feeling better than he did during visit on Saturday. Pt more independent coming to EOB though he reports increased coccyx pain from falling. Pt stood and was able to step feet with mod A, better lifting them up that last visit but SPO2 dropped to 80% with pt on 9L and pt very SOB. WOrked on stepping in place but unable to progress ambulation further due to increased WOB. Changing PT rec to SNF for short term rehab before going home. Pt agreeable. PT will continue to follow.    Recommendations for follow up therapy are one component of a multi-disciplinary discharge planning process, led by the attending physician.  Recommendations may be updated based on patient status, additional functional criteria and insurance authorization.  Follow Up Recommendations  Skilled nursing-short term rehab (<3 hours/day) Can patient physically be transported by private vehicle: No   Assistance Recommended at Discharge Frequent or constant Supervision/Assistance  Patient can return home with the following A little help with walking and/or transfers;A little help with bathing/dressing/bathroom;Assistance with cooking/housework;Direct supervision/assist for medications management;Direct supervision/assist for financial management;Assist for transportation;Help with stairs or ramp for entrance   Equipment Recommendations  None recommended by PT    Recommendations for  Other Services       Precautions / Restrictions Precautions Precautions: Fall Precaution Comments: fell in bathroom while emptying condom cath Restrictions Weight Bearing Restrictions: No Other Position/Activity Restrictions: R gallbladder drain. Watch O2 sats, norm for him is 87-94% on 3L     Mobility  Bed Mobility Overal bed mobility: Needs Assistance Bed Mobility: Supine to Sit     Supine to sit: Min assist, HOB elevated     General bed mobility comments: pt able to initiate supine to sit, min A to get fully to EOB    Transfers Overall transfer level: Needs assistance Equipment used: Rolling walker (2 wheels) Transfers: Sit to/from Stand Sit to Stand: Mod assist   Step pivot transfers: Mod assist       General transfer comment: mod A for power up and to steady while stepping feet. Pt with increased WOB and SPO2 dropped to 80% on 9 L O2    Ambulation/Gait               General Gait Details: Pt worked on stepping in place but was very SOB and SPO2 dropped to 80% on 9L, pt did not feel that he could ambulate further   Stairs             Wheelchair Mobility    Modified Rankin (Stroke Patients Only)       Balance Overall balance assessment: Needs assistance Sitting-balance support: Feet supported, Single extremity supported Sitting balance-Leahy Scale: Fair     Standing balance support: Bilateral upper extremity supported, During functional activity, Reliant on assistive device for balance Standing balance-Leahy Scale: Poor Standing balance comment: needed UE support as well as mod external assist  Cognition Arousal/Alertness: Awake/alert Behavior During Therapy: WFL for tasks assessed/performed Overall Cognitive Status: Within Functional Limits for tasks assessed                                          Exercises      General Comments        Pertinent Vitals/Pain Pain  Assessment Pain Assessment: Faces Faces Pain Scale: Hurts even more Pain Location: tailbone area and R LB Pain Descriptors / Indicators: Sore Pain Intervention(s): Monitored during session, Limited activity within patient's tolerance    Home Living                          Prior Function            PT Goals (current goals can now be found in the care plan section) Acute Rehab PT Goals Patient Stated Goal: return home PT Goal Formulation: With patient Time For Goal Achievement: 05/12/22 Potential to Achieve Goals: Good Progress towards PT goals: Progressing toward goals    Frequency    Min 3X/week      PT Plan Discharge plan needs to be updated    Co-evaluation              AM-PAC PT "6 Clicks" Mobility   Outcome Measure  Help needed turning from your back to your side while in a flat bed without using bedrails?: None Help needed moving from lying on your back to sitting on the side of a flat bed without using bedrails?: A Little Help needed moving to and from a bed to a chair (including a wheelchair)?: A Little Help needed standing up from a chair using your arms (e.g., wheelchair or bedside chair)?: A Lot Help needed to walk in hospital room?: Total Help needed climbing 3-5 steps with a railing? : Total 6 Click Score: 14    End of Session Equipment Utilized During Treatment: Oxygen;Gait belt Activity Tolerance: Patient limited by fatigue Patient left: with call bell/phone within reach;in chair Nurse Communication: Mobility status PT Visit Diagnosis: Unsteadiness on feet (R26.81);Muscle weakness (generalized) (M62.81);Pain;Difficulty in walking, not elsewhere classified (R26.2) Pain - Right/Left: Right Pain - part of body: Hip     Time: 1207-1220 PT Time Calculation (min) (ACUTE ONLY): 13 min  Charges:  $Therapeutic Activity: 8-22 mins                     Leighton Roach, PT  Acute Rehab Services Secure chat preferred Office  [place] 04/30/2022, 2:06 PM

## 2022-05-01 DIAGNOSIS — J9621 Acute and chronic respiratory failure with hypoxia: Secondary | ICD-10-CM | POA: Diagnosis not present

## 2022-05-01 DIAGNOSIS — I4821 Permanent atrial fibrillation: Secondary | ICD-10-CM | POA: Diagnosis not present

## 2022-05-01 DIAGNOSIS — I5033 Acute on chronic diastolic (congestive) heart failure: Secondary | ICD-10-CM | POA: Diagnosis not present

## 2022-05-01 DIAGNOSIS — I4819 Other persistent atrial fibrillation: Secondary | ICD-10-CM | POA: Diagnosis not present

## 2022-05-01 DIAGNOSIS — J849 Interstitial pulmonary disease, unspecified: Secondary | ICD-10-CM | POA: Diagnosis not present

## 2022-05-01 LAB — BASIC METABOLIC PANEL
Anion gap: 14 (ref 5–15)
BUN: 27 mg/dL — ABNORMAL HIGH (ref 8–23)
CO2: 29 mmol/L (ref 22–32)
Calcium: 8.1 mg/dL — ABNORMAL LOW (ref 8.9–10.3)
Chloride: 93 mmol/L — ABNORMAL LOW (ref 98–111)
Creatinine, Ser: 1.07 mg/dL (ref 0.61–1.24)
GFR, Estimated: >60 mL/min (ref >60)
Glucose, Bld: 92 mg/dL (ref 70–99)
Potassium: 3.7 mmol/L (ref 3.5–5.1)
Sodium: 136 mmol/L (ref 135–145)

## 2022-05-01 LAB — RESPIRATORY PANEL BY PCR

## 2022-05-01 MED ORDER — ORAL CARE MOUTH RINSE
15.0000 mL | OROMUCOSAL | Status: DC
  Administered 2022-05-01 – 2022-05-07 (×21): 15 mL via OROMUCOSAL

## 2022-05-01 NOTE — Progress Notes (Addendum)
Progress Note  Patient Name: Vincent Robinson Date of Encounter: 05/01/2022  [facility] Cardiologist: Quay Burow, MD   Subjective   Patient denies chest pain, palpitations, sob, orthopnea. Continues to be on 8 L supplemental O2, down from 10L yesterday.   Inpatient Medications    Scheduled Meds:  arformoterol  15 mcg Nebulization BID   aspirin  324 mg Oral Once   azithromycin  250 mg Oral Daily   budesonide (PULMICORT) nebulizer solution  0.25 mg Nebulization BID   enoxaparin (LOVENOX) injection  40 mg Subcutaneous Q24H   ferrous sulfate  325 mg Oral Q breakfast   levothyroxine  100 mcg Oral QAC breakfast   lidocaine  1 patch Transdermal Q24H   macitentan  10 mg Oral Daily   midodrine  15 mg Oral TID with meals   montelukast  10 mg Oral QHS   pantoprazole  20 mg Oral q morning   potassium chloride  20 mEq Oral Daily   revefenacin  175 mcg Nebulization Daily   sildenafil  40 mg Oral TID   simvastatin  10 mg Oral QHS   sodium chloride flush  3 mL Intravenous Q12H   Continuous Infusions:  cefTRIAXone (ROCEPHIN)  IV 1 g (04/30/22 5638)   furosemide 120 mg (04/30/22 1808)   PRN Meds: acetaminophen **OR** acetaminophen, levalbuterol   Vital Signs    Vitals:   05/01/22 0053 05/01/22 0420 05/01/22 0755 05/01/22 0759  BP: 102/60 (!) 94/54 (!) 92/51   Pulse: 68 (!) 43 73 62  Resp: '20 20 17 17  '$ Temp:  98.2 F (36.8 C) 98 F (36.7 C)   TempSrc:  Oral Oral   SpO2: 99% 100% 96% 93%  Weight: 62.2 kg     Height:        Intake/Output Summary (Last 24 hours) at 05/01/2022 0813 Last data filed at 05/01/2022 0300 Gross per 24 hour  Intake 780 ml  Output 1502 ml  Net -722 ml      05/01/2022   12:53 AM 04/30/2022    5:00 AM 04/29/2022    6:05 AM  Last 3 Weights  Weight (lbs) 137 lb 2 oz 135 lb 2.3 oz 138 lb 10.7 oz  Weight (kg) 62.2 kg 61.3 kg 62.9 kg      Telemetry    Atrial fibrillation, HR in the 40s-70s - Personally Reviewed  ECG    No new tracings since  7/7  - Personally Reviewed  Physical Exam   GEN: No acute distress.  Elderly male, sitting up in bed eating breakfast  Neck: No JVD Cardiac: Irregular rate and rhythm, grade 3/6 systolic murmur at Left sternal boarder Respiratory: Diminished breath sounds in bilateral lower lungs  GI: Soft, nontender, non-distended  MS: No edema; No deformity. Neuro:  Nonfocal  Psych: Normal affect   Labs    High Sensitivity Troponin:   Recent Labs  Lab 04/27/22 0140 04/27/22 0437 04/27/22 1309  TROPONINIHS 27* 30* 37*     Chemistry Recent Labs  Lab 04/27/22 0140 04/27/22 0147 04/28/22 0212 04/29/22 0207 04/30/22 0346 05/01/22 0446  NA 137   < > 137 133* 135 136  K 4.2   < > 3.4* 3.4* 3.9 3.7  CL 98   < > 97* 95* 95* 93*  CO2 27  --  '29 27 27 29  '$ GLUCOSE 92   < > 103* 101* 94 92  BUN 21   < > 18 20 27* 27*  CREATININE 1.07   < >  1.01 0.99 1.22 1.07  CALCIUM 8.6*  --  8.4* 8.0* 8.1* 8.1*  MG 2.1  --   --   --   --   --   PROT 8.0  --  7.3  --   --   --   ALBUMIN 3.4*  --  2.9*  --   --   --   AST 43*  --  23  --   --   --   ALT 23  --  16  --   --   --   ALKPHOS 70  --  66  --   --   --   BILITOT 1.4*  --  1.4*  --   --   --   GFRNONAA >60  --  >60 >60 54* >60  ANIONGAP 12  --  '11 11 13 14   '$ < > = values in this interval not displayed.    Lipids No results for input(s): "CHOL", "TRIG", "HDL", "LABVLDL", "LDLCALC", "CHOLHDL" in the last 168 hours.  Hematology Recent Labs  Lab 04/27/22 0140 04/27/22 0147 04/28/22 0212 04/29/22 0207  WBC 11.4*  --  11.9* 10.3  RBC 3.99*  --  3.97* 3.65*  HGB 13.5 14.6 13.3 12.4*  HCT 40.0 43.0 39.7 36.9*  MCV 100.3*  --  100.0 101.1*  MCH 33.8  --  33.5 34.0  MCHC 33.8  --  33.5 33.6  RDW 14.3  --  14.3 14.1  PLT 245  --  199 167   Thyroid  Recent Labs  Lab 04/27/22 0140  TSH 3.573    BNP Recent Labs  Lab 04/27/22 0140  BNP 258.1*    DDimer No results for input(s): "DDIMER" in the last 168 hours.   Radiology    CT Chest  High Resolution  Result Date: 04/30/2022 CLINICAL DATA:  Hypoxemia EXAM: CT CHEST WITHOUT CONTRAST TECHNIQUE: Multidetector CT imaging of the chest was performed following the standard protocol without intravenous contrast. High resolution imaging of the lungs, as well as inspiratory and expiratory imaging, was performed. RADIATION DOSE REDUCTION: This exam was performed according to the departmental dose-optimization program which includes automated exposure control, adjustment of the mA and/or kV according to patient size and/or use of iterative reconstruction technique. COMPARISON:  Chest CT dated February 05, 2022 FINDINGS: Cardiovascular: Cardiomegaly. No pericardial effusion. Coronary artery calcifications. Severe left main and three-vessel coronary artery calcifications. Mediastinum/Nodes: Esophagus and thyroid are unremarkable. Unchanged mildly enlarged mediastinal lymph nodes, likely reactive. Lungs/Pleura: Central airways are patent. Mild centrilobular emphysema. Bilateral subpleural predominant reticular opacities with mild basilar predominance and associated traction bronchiectasis. Right lower lobe juxtapleural solid nodule which was new on prior exam is not well visualized due to motion artifact. Stable right upper lobe subpleural consolidation with associated calcifications, likely due to scarring. No evidence of pneumothorax. New small right pleural effusion. Upper Abdomen: No acute abnormality. Musculoskeletal: No aggressive appearing osseous lesions. Prior median sternotomy with intact sternal wires. IMPRESSION: 1. Stable fibrotic interstitial lung disease with no evidence of acute airspace opacity, although evaluation is somewhat limited due to respiratory motion artifact. Findings are categorized as probable UIP per consensus guidelines: Diagnosis of Idiopathic Pulmonary Fibrosis: An Official ATS/ERS/JRS/ALAT Clinical Practice Guideline. [place], Iss 5, [phone], Jun 22 2017. 2. Juxtapleural solid pulmonary nodule of the right upper lobe which was described on most recent prior exam is not well visualized due to respiratory motion artifact. See prior exam for follow-up  recommendations. 3. New small right pleural effusion. 4. Cardiomegaly, aortic Atherosclerosis (ICD10-I70.0) and Emphysema (ICD10-J43.9). Electronically Signed   By: Yetta Glassman M.D.   On: 04/30/2022 12:14    Cardiac Studies   None this admission   Echocardiogram 07/09/21 (most recent)   1. Left ventricular ejection fraction, by estimation, is 60 to 65%. The  left ventricle has normal function. The left ventricle has no regional  wall motion abnormalities. There is moderate concentric left ventricular  hypertrophy. Left ventricular  diastolic function could not be evaluated. There is the interventricular  septum is flattened in systole and diastole, consistent with right  ventricular pressure and volume overload.   2. Right ventricular systolic function is severely reduced. The right  ventricular size is severely enlarged. There is severely elevated  pulmonary artery systolic pressure. The estimated right ventricular  systolic pressure is 12.7 mmHg.   3. Left atrial size was severely dilated.   4. Right atrial size was severely dilated.   5. A small pericardial effusion is present. The pericardial effusion is  posterior to the left ventricle.   6. The mitral valve is degenerative. Mild to moderate mitral valve  regurgitation. No evidence of mitral stenosis.   7. Tricuspid valve regurgitation is moderate to severe.   8. Moderate aortic stenosis. Vmax 3.6 m/s, MG 30.2 mmHG, AVA 1.11 cm2, DI  0.32. All consistent with moderate AS. The aortic valve is tricuspid.  There is severe calcifcation of the aortic valve. There is severe  thickening of the aortic valve. Aortic  valve regurgitation is mild. Moderate aortic valve stenosis.   9. There is mild dilatation of the ascending aorta,  measuring 42 mm.  10. The inferior vena cava is dilated in size with <50% respiratory  variability, suggesting right atrial pressure of 15 mmHg.   Patient Profile     88 y.o. male with a hx of CAD s/p CABG in 1998, AAA s/p EVAR 10/09, pulmonary hypertension with cor pulmonale, chronic hypoxic respiratory failure on 3 L oxygen during daytime and BiPAP at night due to COPD, symptomatic bradycardia (declined pacemaker), paroxysmal atrial fibrillation (not on anticoagulation due to prior GI bleed) , obstructive sleep apnea and aortic stenosis who is being seen since 04/27/2022 for the evaluation of CHF   Assessment & Plan    Acute on chronic hypoxic respiratory failure Pulmonary hypertension with cor pulmonale COPD/Interstitial lung disease Acute on Chronic Right sided Heart Failure  - No echo this admission-- most recent echo from 06/2021 showed EF 60-65%, moderate LVH, severely reduced RV systolic function, severely elevated pulmonary artery systolic pressure  - Patient presented to ED after fall-- found to by hypoxic. Initially required BiPAP and high flow oxygen upon arrival.  His symptoms has improved.  No net  I&O recorded, but did output 1.4 L urine yesterday. Weight down 10 lbs  - He is still requiring  6 L supplemental oxygen, down from 10 L yesterday  - Patient has been on IV lasix 120 mg BID. Renal function and electrolytes stable. Continue IV lasix today, suspect we will transition to home torsemide tomorrow  - Continue midodrine 15 mg 3 times daily for blood pressure support (new this admission) - Continue Sildenafil and Macitentan for pHTN - Pulmonology consulted yesterday-- started patient on brovana, pulmicort, and yupelri    Bradycardia - Declined pacemaker.  No significant pauses or bradycardia.   Persistent atrial fibrillation -Not on anticoagulation due to prior GI bleed - HR is controlled without medications  For questions or updates, please contact [place] Please consult [email or web address] for contact info under        Signed, [name] Billet, PA-C  05/01/2022, 8:13 AM    Patient examined chart reviewed AS murmur and chronic lung dx ILD/COPD Slow afib Not a candidate for PPM or AVR post CABG in 1998 with no angina Continue meds for pulmonary HTN and midodrine for BP support Change to home demedex in am Primary issue is severe lung dx with pulmonary HTN cardiology has nothing further to offer   Jenkins Rouge MD [facility]

## 2022-05-01 NOTE — Care Management Important Message (Signed)
Important Message  Patient Details  Name: Vincent Robinson MRN: [id number] Date of Birth: 04-18-1923   Medicare Important Message Given:  Yes     Alexus Michael Montine Circle 05/01/2022, 3:31 PM

## 2022-05-01 NOTE — Progress Notes (Signed)
NAME:  Vincent Robinson, MRN:  [id number], DOB:  June 01, 1923, LOS: 4 ADMISSION DATE:  04/27/2022, CONSULTATION DATE:  05/01/22 REFERRING MD:  Verdell Carmine, CHIEF COMPLAINT:  shortness of breath  History of Present Illness:  Vincent Robinson is a 88 y.o. M with PMH significant for COPD on home O2, CAD,  OSA on Bipap, HFpEF, pulmonary HTN, post-inflammatory lung fibrosis and atrial fibrillation followed by Dr. Vaughan Browner who presented initially after a mechanical fall at home. He was short of breath and admission and work-up consistent with pulmonary edema.  He was admitted and seen by the heart failure team and diuresed.  He is on 3L baseline home O2, oxygen requirements remained above baseline at 9L after diuresis, so PCCM consulted.   Pt is sitting up in bed and states that he is not feeling significantly short of breath.  He has occasionally tightness across the chest and has been coughing up green/whitish sputum.  Denies fever or wheezing.   Pertinent  Medical History   has a past medical history of AAA (abdominal aortic aneurysm) ([place]), Acute respiratory failure ([place]) (11/23/2019), Asthma, Basal cell carcinoma, CHF (congestive heart failure) ([place]), Chronic respiratory failure ([place]), COPD (chronic obstructive pulmonary disease) ([place]), Coronary artery disease, Diverticulitis, Dysrhythmia, History of chronic respiratory failure, Hyperlipidemia, Hypothyroidism, Moderate aortic stenosis, Myocardial infarction ([place]), Pneumonia, Pulmonary HTN ([place]), Right heart failure ([place]), Skin cancer (2017), and Sleep apnea.   Significant Hospital Events: Including procedures, antibiotic start and stop dates in addition to other pertinent events   7/7 admit after mechanical fall with shortness of breath 7/10 remains on 9L o2, HRCT obtained and shows stable ILD and small R pleural effusion  Interim History / Subjective:  As above  Objective   Blood pressure (!) 89/62, pulse 88, temperature 98.3 F (36.8 C), temperature source  Oral, resp. rate 18, height '5\' 7"'$  (1.702 m), weight 62.2 kg, SpO2 91 %.    FiO2 (%):  [50 %] 50 %   Intake/Output Summary (Last 24 hours) at 05/01/2022 1311 Last data filed at 05/01/2022 1246 Gross per 24 hour  Intake 900 ml  Output 1551 ml  Net -651 ml   Filed Weights   04/29/22 0605 04/30/22 0500 05/01/22 0053  Weight: 62.9 kg 61.3 kg 62.2 kg    General:  elderly M, sitting up in bed awake and in no distress HEENT: MM pink/moist, [place] in place, sclera anicteric  Neuro: awake, alert and oriented, following commands, non-focal CV: s1s2 rrr, no m/r/g PULM:  good air movement, no wheezing or rhonchi, no distress, transiently hypoxic after getting up to the bathroom GI: soft, non-tender Extremities: warm/dry, no edema  Skin: no rashes or lesions   Resolved Hospital Problem list     Assessment & Plan:   Acute on Chronic Hypoxemic Respiratory Failure Mild ILD COPD Pulmonary Hypertension Atrial Fibrillation Possible Pneumonia  - Continue to wean supplemental oxygen for goal SpO2 88-92% - Recommend 5 day course of ceftriaxone and 5 day course of azithromycin - Continue budesonide, yupelri and brovana nebs - Will consider adding systemic steroids, but seems to be slowly improving as is. - Encourage IS and mobilization - Diuresis per cardiology  PCCM will continue to follow  Best Practice (right click and "Reselect all SmartList Selections" daily)   Per primary  Labs   CBC: Recent Labs  Lab 04/27/22 0140 04/27/22 0147 04/28/22 0212 04/29/22 0207  WBC 11.4*  --  11.9* 10.3  NEUTROABS 8.2*  --   --   --  HGB 13.5 14.6 13.3 12.4*  HCT 40.0 43.0 39.7 36.9*  MCV 100.3*  --  100.0 101.1*  PLT 245  --  199 423    Basic Metabolic Panel: Recent Labs  Lab 04/27/22 0140 04/27/22 0147 04/28/22 0212 04/29/22 0207 04/30/22 0346 05/01/22 0446  NA 137 135 137 133* 135 136  K 4.2 5.1 3.4* 3.4* 3.9 3.7  CL 98 96* 97* 95* 95* 93*  CO2 27  --  '29 27 27 29  '$ GLUCOSE 92  95 103* 101* 94 92  BUN 21 30* 18 20 27* 27*  CREATININE 1.07 1.00 1.01 0.99 1.22 1.07  CALCIUM 8.6*  --  8.4* 8.0* 8.1* 8.1*  MG 2.1  --   --   --   --   --    GFR: Estimated Creatinine Clearance: 35.5 mL/min (by C-G formula based on SCr of 1.07 mg/dL). Recent Labs  Lab 04/27/22 0140 04/27/22 1309 04/28/22 0212 04/29/22 0207  PROCALCITON  --  <0.10  --   --   WBC 11.4*  --  11.9* 10.3    Liver Function Tests: Recent Labs  Lab 04/27/22 0140 04/28/22 0212  AST 43* 23  ALT 23 16  ALKPHOS 70 66  BILITOT 1.4* 1.4*  PROT 8.0 7.3  ALBUMIN 3.4* 2.9*   Recent Labs  Lab 04/27/22 0140  LIPASE 59*   No results for input(s): "AMMONIA" in the last 168 hours.  ABG    Component Value Date/Time   PHART 7.467 (H) 12/18/2019 1726   PCO2ART 38.8 12/18/2019 1726   PO2ART 70.3 (L) 12/18/2019 1726   HCO3 27.8 12/18/2019 1726   TCO2 31 04/27/2022 0147   O2SAT 97.9 09/16/2020 1337     Coagulation Profile: No results for input(s): "INR", "PROTIME" in the last 168 hours.  Cardiac Enzymes: No results for input(s): "CKTOTAL", "CKMB", "CKMBINDEX", "TROPONINI" in the last 168 hours.  HbA1C: Hgb A1c MFr Bld  Date/Time Value Ref Range Status  09/08/2020 07:40 AM 5.2 4.8 - 5.6 % Final    Comment:    (NOTE) Pre diabetes:          5.7%-6.4%  Diabetes:              >6.4%  Glycemic control for   <7.0% adults with diabetes     CBG: Recent Labs  Lab 04/30/22 0609  GLUCAP 88    Critical care time: n/a    Freda Jackson, MD [facility] Office: [phone]   See Amion for personal pager PCCM on call pager [phone] until 7pm. Please call [place] 7p-7a. [phone]

## 2022-05-01 NOTE — TOC Progression Note (Signed)
Transition of Care [facility]) - Progression Note    Patient Details  Name: DEMONE LYLES MRN: [id number] Date of Birth: [birth year]  Transition of Care [facility]) CM/SW [place], [place] Phone Number: 05/01/2022, 3:08 PM  Clinical Narrative:     CSW called daughter to update on SNF. Explained that [facility] can accept patient from a clinical standpoint but will depend on their bed availability at [place]. Daughter states that pt was also interested in [facility]. She inquired about other facilities in the [facility] area. CSW explained that he already spoke with [facility] today and they do not currently have any beds. Daughter would like referrals be sent to [facility] and [facility]. CSW sent referrals in [place]. TOC will continue to follow for discharge to SNF.   Expected Discharge Plan: [place] Barriers to Discharge: Continued Medical Work up  Expected Discharge Plan and Services Expected Discharge Plan: [place] In-house Referral: Clinical Social Work Discharge Planning Services: CM Consult Post Acute Care Choice: [place] arrangements for the past 2 months: Apartment                                       Social Determinants of Health (SDOH) Interventions    Readmission Risk Interventions    07/11/2021   12:38 PM 09/19/2020    3:18 PM 03/25/2020   11:02 AM  Readmission Risk Prevention Plan  Transportation Screening Complete Complete Complete  HRI or Home Care Consult  Complete Patient refused  Social Work Consult for [place] Planning/Counseling  Complete   Palliative Care Screening  Complete Not Applicable  Medication Review Press photographer) Complete Complete Complete  PCP or Specialist appointment within 3-5 days of discharge Complete    HRI or [place] Complete    SW Recovery Care/Counseling Consult Complete    [place] Not Applicable

## 2022-05-01 NOTE — Plan of Care (Signed)
  Problem: Clinical Measurements: Goal: Respiratory complications will improve Outcome: Progressing   Problem: Activity: Goal: Risk for activity intolerance will decrease Outcome: Progressing   Problem: Coping: Goal: Level of anxiety will decrease Outcome: Progressing   Problem: Safety: Goal: Ability to remain free from injury will improve Outcome: Progressing   

## 2022-05-01 NOTE — Progress Notes (Signed)
PROGRESS NOTE  Vincent Robinson  WVP:[id number] DOB: Apr 10, 1923 DOA: 04/27/2022 PCP: Katherina Mires, MD   Brief Narrative:  89/M with history of chronic diastolic CHF, pulmonary hypertension, cor pulmonale, COPD, CAD/CABG, interstitial lung disease, chronic hypoxic respiratory failure on 3 L home O2, paroxysmal A-fib, chronic hypotension on midodrine, hypothyroidism, abdominal aortic aneurysm status post endovascular repair, chronic cholecystitis with drain, bladder cancer, OSA on CPAP was brought to the ED after a fall, also reported history of progressive dyspnea on exertion .  In the ED he was noted to be profoundly hypoxic, volume overloaded, chest x-ray with cardiomegaly with pulmonary vascular congestion, started on diuretics and BiPAP, cardiology following. -Despite diuretics continued to have high O2 requirements, pulmonary following, slowly improving  Assessment & Plan:  Acute on chronic hypoxic respiratory failure:  -multifactorial, from CHF, pulmonary hypertension, COPD, ILD -now down to 8L HFNC, wean O2 down as tolerated -Clinically appears euvolemic, transition to oral diuretics tomorrow -Pulmonary following, appreciate input, started Brovana Pulmicort and yupelri, added azithromycin to ceftriaxone, day 4/5 -Add incentive spirometry -Plan for SNF  Acute on chronic diastolic CHF Pulmonary hypertension,Cor Pulmonale -Last echo had shown EF of 60 to 65%, indeterminate diastolic parameters.  -On torsemide at baseline cardiology following, currently on high-dose IV Lasix 120 Mg twice daily, urine output is inaccurate however weight down 5 lbs -Cards following, appears euvolemic, transition to oral diuretics tomorrow -GDMT limited by chronic hypotension, on max dose midodrine -Remains on high-dose sildenafil and macitentan for PAH -Followed by advanced heart failure team at baseline -Briefly discussed palliative care with the patient, would benefit from palliative care follow-up after  discharge  Chronic hypotension: On high-dose midodrine at baseline, continued  Suspected UTI: UA showed large leukocytes.  Urine culture with 100,000 colonies, multiple species, treated with ceftriaxone  Coronary artery disease/elevated troponin:  -likely this is from demand ischemia from CHF exacerbation.  Paroxysmal A-fib: Currently rate is controlled.  Not on anticoagulant due to history of GI bleed.  History of fall: Fell at home.  PT/OT consulted.  X-ray of pelvis did not show any fracture or dislocation  Hypothyroidism: TSH is elevated, increased Synthroid dose from 88 to 100 mcg  Hypokalemia: Supplemented with potassium  Chronic cholecystitis, cholecystostomy drain: Says he had this for last 2 to 3 years.  Follows with [facility] every 2 months to change the bag.   -Routine drain care  History of abdominal aortic aneurysm: Status post endovascular stenting in 2009  OSA: Continue BiPAP at night, uses BiPAP at home  DVT prophylaxis:enoxaparin (LOVENOX) injection 40 mg Start: 04/27/22 1015   Code Status: DNR Family Communication: No family at bedside, updated daughter 79/10  Consultants: Cardiology, pulmonary  Procedures: None  Antimicrobials:  Anti-infectives (From admission, onward)    Start     Dose/Rate Route Frequency Ordered Stop   05/01/22 1000  azithromycin (ZITHROMAX) tablet 250 mg       See Hyperspace for full Linked Orders Report.   250 mg Oral Daily 04/30/22 1604 05/05/22 0959   04/30/22 1700  azithromycin (ZITHROMAX) tablet 500 mg       See Hyperspace for full Linked Orders Report.   500 mg Oral Daily 04/30/22 1604 04/30/22 1706   04/27/22 0745  cefTRIAXone (ROCEPHIN) 1 g in sodium chloride 0.9 % 100 mL IVPB        1 g 200 mL/hr over 30 Minutes Intravenous Every 24 hours 04/27/22 0736         Subjective: Patient seen and examined,  sitting up in bed, reports breathing better, did not realize that he is still on 10 L O2  Objective: Vitals:    05/01/22 0755 05/01/22 0759 05/01/22 0818 05/01/22 1045  BP: (!) 92/51   (!) 89/62  Pulse: 73 62  88  Resp: '17 17  18  '$ Temp: 98 F (36.7 C)   98.3 F (36.8 C)  TempSrc: Oral   Oral  SpO2: 96% 93% 99% 91%  Weight:      Height:        Intake/Output Summary (Last 24 hours) at 05/01/2022 1234 Last data filed at 05/01/2022 1232 Gross per 24 hour  Intake 1080 ml  Output 1851 ml  Net -771 ml   Filed Weights   04/29/22 0605 04/30/22 0500 05/01/22 0053  Weight: 62.9 kg 61.3 kg 62.2 kg    Examination:  General exam: Pleasant elderly chronically ill male sitting up in bed, AAO x3, no distress HEENT: No JVD CVS: S1-S2, irregularly irregular rhythm, systolic murmur Lungs: Bilateral Rales Abdomen: Soft, nontender,, mildly distended Extremities: No edema  Data Reviewed: I have personally reviewed following labs and imaging studies  CBC: Recent Labs  Lab 04/27/22 0140 04/27/22 0147 04/28/22 0212 04/29/22 0207  WBC 11.4*  --  11.9* 10.3  NEUTROABS 8.2*  --   --   --   HGB 13.5 14.6 13.3 12.4*  HCT 40.0 43.0 39.7 36.9*  MCV 100.3*  --  100.0 101.1*  PLT 245  --  199 924   Basic Metabolic Panel: Recent Labs  Lab 04/27/22 0140 04/27/22 0147 04/28/22 0212 04/29/22 0207 04/30/22 0346 05/01/22 0446  NA 137 135 137 133* 135 136  K 4.2 5.1 3.4* 3.4* 3.9 3.7  CL 98 96* 97* 95* 95* 93*  CO2 27  --  '29 27 27 29  '$ GLUCOSE 92 95 103* 101* 94 92  BUN 21 30* 18 20 27* 27*  CREATININE 1.07 1.00 1.01 0.99 1.22 1.07  CALCIUM 8.6*  --  8.4* 8.0* 8.1* 8.1*  MG 2.1  --   --   --   --   --      Recent Results (from the past 240 hour(s))  Urine Culture     Status: Abnormal   Collection Time: 04/27/22  4:40 AM   Specimen: Urine, Clean Catch  Result Value Ref Range Status   Specimen Description URINE, CLEAN CATCH  Final   Special Requests   Final    NONE Performed at [facility], [place]. [address] [place], [place] [address]    Culture (A)  Final    >=100,000 COLONIES/mL  MULTIPLE SPECIES PRESENT, SUGGEST RECOLLECTION   Report Status 04/28/2022 FINAL  Final  Respiratory (~20 pathogens) panel by PCR     Status: None   Collection Time: 05/01/22  7:35 AM  Result Value Ref Range Status   Adenovirus NOT DETECTED NOT DETECTED Final   Coronavirus 229E NOT DETECTED NOT DETECTED Final    Comment: (NOTE) The Coronavirus on the Respiratory Panel, DOES NOT test for the novel  Coronavirus (2019 nCoV)    Coronavirus HKU1 NOT DETECTED NOT DETECTED Final   Coronavirus NL63 NOT DETECTED NOT DETECTED Final   Coronavirus OC43 NOT DETECTED NOT DETECTED Final   Metapneumovirus NOT DETECTED NOT DETECTED Final   Rhinovirus / Enterovirus NOT DETECTED NOT DETECTED Final   Influenza A NOT DETECTED NOT DETECTED Final   Influenza B NOT DETECTED NOT DETECTED Final   Parainfluenza Virus 1 NOT DETECTED NOT DETECTED  Final   Parainfluenza Virus 2 NOT DETECTED NOT DETECTED Final   Parainfluenza Virus 3 NOT DETECTED NOT DETECTED Final   Parainfluenza Virus 4 NOT DETECTED NOT DETECTED Final   Respiratory Syncytial Virus NOT DETECTED NOT DETECTED Final   Bordetella pertussis NOT DETECTED NOT DETECTED Final   Bordetella Parapertussis NOT DETECTED NOT DETECTED Final   Chlamydophila pneumoniae NOT DETECTED NOT DETECTED Final   Mycoplasma pneumoniae NOT DETECTED NOT DETECTED Final    Comment: Performed at [facility], [place] [address] [place], [place] [address]     Radiology Studies: CT Chest High Resolution  Result Date: 04/30/2022 CLINICAL DATA:  Hypoxemia EXAM: CT CHEST WITHOUT CONTRAST TECHNIQUE: Multidetector CT imaging of the chest was performed following the standard protocol without intravenous contrast. High resolution imaging of the lungs, as well as inspiratory and expiratory imaging, was performed. RADIATION DOSE REDUCTION: This exam was performed according to the departmental dose-optimization program which includes automated exposure control, adjustment of the mA and/or  kV according to patient size and/or use of iterative reconstruction technique. COMPARISON:  Chest CT dated February 05, 2022 FINDINGS: Cardiovascular: Cardiomegaly. No pericardial effusion. Coronary artery calcifications. Severe left main and three-vessel coronary artery calcifications. Mediastinum/Nodes: Esophagus and thyroid are unremarkable. Unchanged mildly enlarged mediastinal lymph nodes, likely reactive. Lungs/Pleura: Central airways are patent. Mild centrilobular emphysema. Bilateral subpleural predominant reticular opacities with mild basilar predominance and associated traction bronchiectasis. Right lower lobe juxtapleural solid nodule which was new on prior exam is not well visualized due to motion artifact. Stable right upper lobe subpleural consolidation with associated calcifications, likely due to scarring. No evidence of pneumothorax. New small right pleural effusion. Upper Abdomen: No acute abnormality. Musculoskeletal: No aggressive appearing osseous lesions. Prior median sternotomy with intact sternal wires. IMPRESSION: 1. Stable fibrotic interstitial lung disease with no evidence of acute airspace opacity, although evaluation is somewhat limited due to respiratory motion artifact. Findings are categorized as probable UIP per consensus guidelines: Diagnosis of Idiopathic Pulmonary Fibrosis: An Official ATS/ERS/JRS/ALAT Clinical Practice Guideline. [place], Iss 5, [phone], Jun 22 2017. 2. Juxtapleural solid pulmonary nodule of the right upper lobe which was described on most recent prior exam is not well visualized due to respiratory motion artifact. See prior exam for follow-up recommendations. 3. New small right pleural effusion. 4. Cardiomegaly, aortic Atherosclerosis (ICD10-I70.0) and Emphysema (ICD10-J43.9). Electronically Signed   By: Yetta Glassman M.D.   On: 04/30/2022 12:14    Scheduled Meds:  arformoterol  15 mcg Nebulization BID   aspirin  324 mg Oral Once    azithromycin  250 mg Oral Daily   budesonide (PULMICORT) nebulizer solution  0.25 mg Nebulization BID   enoxaparin (LOVENOX) injection  40 mg Subcutaneous Q24H   ferrous sulfate  325 mg Oral Q breakfast   levothyroxine  100 mcg Oral QAC breakfast   lidocaine  1 patch Transdermal Q24H   macitentan  10 mg Oral Daily   midodrine  15 mg Oral TID with meals   montelukast  10 mg Oral QHS   pantoprazole  20 mg Oral q morning   potassium chloride  20 mEq Oral Daily   revefenacin  175 mcg Nebulization Daily   sildenafil  40 mg Oral TID   simvastatin  10 mg Oral QHS   sodium chloride flush  3 mL Intravenous Q12H   Continuous Infusions:  cefTRIAXone (ROCEPHIN)  IV 1 g (05/01/22 0824)   furosemide 120 mg (05/01/22 0956)  LOS: 4 days   Domenic Polite, MD [facility] 05/01/2022, 12:34 PM

## 2022-05-02 ENCOUNTER — Inpatient Hospital Stay (HOSPITAL_COMMUNITY): Payer: Medicare Other

## 2022-05-02 DIAGNOSIS — J9621 Acute and chronic respiratory failure with hypoxia: Secondary | ICD-10-CM | POA: Diagnosis not present

## 2022-05-02 DIAGNOSIS — J449 Chronic obstructive pulmonary disease, unspecified: Secondary | ICD-10-CM | POA: Diagnosis not present

## 2022-05-02 DIAGNOSIS — I5033 Acute on chronic diastolic (congestive) heart failure: Secondary | ICD-10-CM | POA: Diagnosis not present

## 2022-05-02 DIAGNOSIS — W19XXXA Unspecified fall, initial encounter: Secondary | ICD-10-CM

## 2022-05-02 DIAGNOSIS — Y92009 Unspecified place in unspecified non-institutional (private) residence as the place of occurrence of the external cause: Secondary | ICD-10-CM

## 2022-05-02 DIAGNOSIS — N3001 Acute cystitis with hematuria: Secondary | ICD-10-CM | POA: Diagnosis not present

## 2022-05-02 DIAGNOSIS — J849 Interstitial pulmonary disease, unspecified: Secondary | ICD-10-CM | POA: Diagnosis not present

## 2022-05-02 LAB — C-REACTIVE PROTEIN: CRP: 3 mg/dL — ABNORMAL HIGH (ref <1.0)

## 2022-05-02 LAB — BASIC METABOLIC PANEL
Anion gap: 8 (ref 5–15)
BUN: 28 mg/dL — ABNORMAL HIGH (ref 8–23)
CO2: 29 mmol/L (ref 22–32)
Calcium: 7.9 mg/dL — ABNORMAL LOW (ref 8.9–10.3)
Chloride: 95 mmol/L — ABNORMAL LOW (ref 98–111)
Creatinine, Ser: 1.08 mg/dL (ref 0.61–1.24)
GFR, Estimated: >60 mL/min (ref >60)
Glucose, Bld: 105 mg/dL — ABNORMAL HIGH (ref 70–99)
Potassium: 3.8 mmol/L (ref 3.5–5.1)
Sodium: 132 mmol/L — ABNORMAL LOW (ref 135–145)

## 2022-05-02 LAB — SEDIMENTATION RATE: Sed Rate: 25 mm/hr — ABNORMAL HIGH (ref 0–16)

## 2022-05-02 MED ORDER — TORSEMIDE 20 MG PO TABS
60.0000 mg | ORAL_TABLET | Freq: Every day | ORAL | Status: DC
  Administered 2022-05-02 – 2022-05-07 (×6): 60 mg via ORAL
  Filled 2022-05-02 (×6): qty 3

## 2022-05-02 NOTE — Assessment & Plan Note (Addendum)
Pneumonia.   Dyspnea continue to improve, I have reduced supplemental 02 per [place] to 5 L per min with 02 saturation 88%.   Patient has completed antibiotic therapy with ceftriaxone and azithromycin Continue bronchodilator therapy and inhaled steroids.   Keep 02 saturation 88% or greater.

## 2022-05-02 NOTE — Assessment & Plan Note (Addendum)
Ambulatory dysfunction, continue pt and ot  Plan for possible dc to SNF in am.

## 2022-05-02 NOTE — Progress Notes (Signed)
  Progress Note   Patient: Vincent Robinson JJO:[id number] DOB: Feb 13, 1923 DOA: 04/27/2022     5 DOS: the patient was seen and examined on 05/02/2022   Brief hospital course: 89/M with history of chronic diastolic CHF, pulmonary hypertension, cor pulmonale, COPD, CAD/CABG, interstitial lung disease, chronic hypoxic respiratory failure on 3 L home O2, paroxysmal A-fib, chronic hypotension on midodrine, hypothyroidism, abdominal aortic aneurysm status post endovascular repair, chronic cholecystitis with drain, bladder cancer, OSA on CPAP was brought to the ED after a fall, also reported history of progressive dyspnea on exertion .  In the ED he was noted to be profoundly hypoxic, volume overloaded, chest x-ray with cardiomegaly with pulmonary vascular congestion, started on diuretics and BiPAP, cardiology following. -Despite diuretics continued to have high O2 requirements, pulmonary following, slowly improving    Assessment and Plan: Acute on chronic diastolic CHF (congestive heart failure) (HCC) Echocardiogram with preserved LV systolic function., EF 60 to 65%. Moderate LVH, interventricular septum flattened in systole and diastole. RV systolic function with severe reduction. Severe RV cavity dilatation. RVSP 84,6 severer dilatation of RA and LA. Small pericardial effusion, mild to moderate MR, moderate to severe TR, moderate aortic stenosis.   Acute on chronic core pulmonale.   Improving volume status.   Continue with midodrine and diuresis with torsemide.   Continue with macitentan and sildenafil.   ILD (interstitial lung disease) ([place]) Continue supportive medical therapy, supplemental 02.  No clinical signs of ILD flare.  Holding on systemic steroids for now.   COPD (chronic obstructive pulmonary disease) (HCC) Pneumonia.   Patient continue with high oxygen requirements up to 7 L/min with 02 saturation 95%   Plan to continue antibiotic therapy with ceftriaxone and azithromycin Continue  bronchodilator therapy and inhaled steroids.   UTI (urinary tract infection) Completed treatment.   Atrial fibrillation ([place]) Continue rate control and anticoagulation,   Fall at home, initial encounter Ambulatory dysfunction, continue pt and ot  Patient will be discharge to SNF when medically stable.   Hypothyroidism Continue levothyroxine         Subjective: Patient with improvement in dyspnea, but not yet back to baseline, at home uses 3 L per [place]   Physical Exam: Vitals:   05/02/22 0420 05/02/22 0815 05/02/22 1331 05/02/22 1622  BP: (!) 97/59 97/62 (!) 100/54 129/75  Pulse: (!) 50 92 67 64  Resp: 20   20  Temp: 98 F (36.7 C) 97.8 F (36.6 C) 97.8 F (36.6 C) 97.8 F (36.6 C)  TempSrc:  Oral Oral Oral  SpO2: 99% 90%  94%  Weight: 61.6 kg     Height:       Neurology awake and alert ENT With no pallor Cardiovascular with S1 and S2 present with no gallops, positive systolic murmur at the right sternal border 3/6  Respiratory with no rales or wheezing Abdomen not distended No lower extremity edema  Data Reviewed:    Family Communication: no family at the bedside   Disposition: Status is: Inpatient Remains inpatient appropriate because: respiratory failure   Planned Discharge Destination: Skilled nursing facility    Author: Tawni Millers, MD 05/02/2022 5:29 PM  For on call review [email or web address].

## 2022-05-02 NOTE — Hospital Course (Addendum)
Mr. Marchetta was admitted to the hospital with the working diagnosis of acute on chronic hypoxemic respiratory failure due to heart failure decompensation, acute on chronic core pulmonale and pneumonia.   89/M with history of chronic diastolic CHF, pulmonary hypertension, cor pulmonale, COPD, CAD/CABG, interstitial lung disease, chronic hypoxic respiratory failure on 3 L home O2, paroxysmal A-fib, chronic hypotension on midodrine, hypothyroidism, abdominal aortic aneurysm status post endovascular repair, chronic cholecystitis with drain, bladder cancer, OSA on CPAP was brought to the ED after a fall. Patient sustained a mechanical fall in the bathroom while emptying his condom cathter. Positive hip pain after fall. Positive productive cough for a few days and progressive dyspnea since the fall.   On his initial physical examination his blood pressure was 93/50, RR 15 to 38, and in respiratory distress, placed on Bipap. Lungs with no wheezing but positive rales, heart with S1 and S2 present irregularly irregular, abdomen not distended and positive lower extremity edema.   Na 135, K 5.1 CL 96 glucose 95 bunt 30 cr 1,0  Wbc 11,4 hgb 13,5 plt 245  Urine analysis SG 1,009, negative protein, >50 wbc   Chest radiograph with cardiomegaly and increase lung markings bilaterally.   EKG 87 bpm, right axis deviation, right bundle branch block, atrial fibrillation with multifocal PVC, no significant ST segment or  T wave changes.   Patient was placed on antibiotic therapy and diuresis. Wean off from Bipap to nasal cannula with good toleration. Continue with high oxygen requirements.   07/14 inflammatory markers elevated and patient was started on systemic steroids.  07/15 continue to improve oxygenation, today is on 4 L/min per [place].  07/16 decreasing 02 requirements   Plan to discharge to SNF and continue slow prednisone taper. Follow up with pulmonary as outpatient.

## 2022-05-02 NOTE — Assessment & Plan Note (Addendum)
Echocardiogram with preserved LV systolic function., EF 60 to 65%. Moderate LVH, interventricular septum flattened in systole and diastole. RV systolic function with severe reduction. Severe RV cavity dilatation. RVSP 84,6 severer dilatation of RA and LA. Small pericardial effusion, mild to moderate MR, moderate to severe TR, moderate aortic stenosis.   Acute on chronic core pulmonale.   Patient was diuresed with furosemide IV, negative fluid balance was achieved, -5,050 ml, with significant improvement in his symptoms.   He has been transitioned to oral toresemide with good toleration.    Continue with midodrine for blood pressure support and diuresis with torsemide.   Keep MAP 65  Continue with macitentan and sildenafil for pulmonary hypertension.

## 2022-05-02 NOTE — Plan of Care (Signed)
  Problem: Health Behavior/Discharge Planning: Goal: Ability to manage health-related needs will improve Outcome: Progressing   Problem: Clinical Measurements: Goal: Ability to maintain clinical measurements within normal limits will improve Outcome: Progressing Goal: Respiratory complications will improve Outcome: Progressing   Problem: Safety: Goal: Ability to remain free from injury will improve Outcome: Progressing

## 2022-05-02 NOTE — Progress Notes (Signed)
NAME:  Vincent Robinson, MRN:  [id number], DOB:  [birth year], LOS: 5 ADMISSION DATE:  04/27/2022, CONSULTATION DATE:  05/02/22 REFERRING MD:  Verdell Carmine, CHIEF COMPLAINT:  shortness of breath  History of Present Illness:  Vincent Robinson is a 88 y.o. M with PMH significant for COPD on home O2, CAD,  OSA on Bipap, HFpEF, pulmonary HTN, post-inflammatory lung fibrosis and atrial fibrillation followed by Dr. Vaughan Browner who presented initially after a mechanical fall at home. He was short of breath and admission and work-up consistent with pulmonary edema.  He was admitted and seen by the heart failure team and diuresed.  He is on 3L baseline home O2, oxygen requirements remained above baseline at 9L after diuresis, so PCCM consulted.   Pt is sitting up in bed and states that he is not feeling significantly short of breath.  He has occasionally tightness across the chest and has been coughing up green/whitish sputum.  Denies fever or wheezing.   Pertinent  Medical History   has a past medical history of AAA (abdominal aortic aneurysm) ([place]), Acute respiratory failure ([place]) (11/23/2019), Asthma, Basal cell carcinoma, CHF (congestive heart failure) ([place]), Chronic respiratory failure ([place]), COPD (chronic obstructive pulmonary disease) ([place]), Coronary artery disease, Diverticulitis, Dysrhythmia, History of chronic respiratory failure, Hyperlipidemia, Hypothyroidism, Moderate aortic stenosis, Myocardial infarction ([place]), Pneumonia, Pulmonary HTN ([place]), Right heart failure ([place]), Skin cancer (2017), and Sleep apnea.   Significant Hospital Events: Including procedures, antibiotic start and stop dates in addition to other pertinent events   7/7 admit after mechanical fall with shortness of breath 7/10 remains on 9L o2, HRCT obtained and shows stable ILD and small R pleural effusion  Interim History / Subjective:   No acute events overnight.  He has been weaned to 7L O2 today He is feeling ok, reports having a hard time  walking when he tried to ambulate from bed to bathroom.  Objective   Blood pressure 97/62, pulse 92, temperature 97.8 F (36.6 C), temperature source Oral, resp. rate 20, height '5\' 7"'  (1.702 m), weight 61.6 kg, SpO2 90 %.    FiO2 (%):  [50 %] 50 %   Intake/Output Summary (Last 24 hours) at 05/02/2022 0831 Last data filed at 05/02/2022 0816 Gross per 24 hour  Intake 960 ml  Output 1575 ml  Net -615 ml   Filed Weights   04/30/22 0500 05/01/22 0053 05/02/22 0420  Weight: 61.3 kg 62.2 kg 61.6 kg    General:  elderly male, no acute distress, resting in bed HEENT: MM pink/moist, [place] in place, sclera anicteric  Neuro: awake, alert and oriented, following commands, non-focal CV: s1s2 rrr, no m/r/g PULM:  bibasilar rales, good air movement, no wheezing or rhonchi, no distress GI: soft, non-tender Extremities: warm/dry, no edema  Skin: no rashes or lesions   Resolved Hospital Problem list     Assessment & Plan:   Acute on Chronic Hypoxemic Respiratory Failure Mild ILD COPD Pulmonary Hypertension Atrial Fibrillation Possible Pneumonia  - Continue to wean supplemental oxygen for goal SpO2 88-92% - Recommend 5 day course of ceftriaxone and 5 day course of azithromycin - Continue budesonide, yupelri and brovana nebs - Check CRP and ESR. Will consider adding systemic steroids. - Repeat CXR today - Encourage IS and mobilization - Diuresis per cardiology  PCCM will continue to follow  Best Practice (right click and "Reselect all SmartList Selections" daily)   Per primary  Labs   CBC: Recent Labs  Lab 04/27/22 0140 04/27/22 0147 04/28/22 8921 04/29/22  0207  WBC 11.4*  --  11.9* 10.3  NEUTROABS 8.2*  --   --   --   HGB 13.5 14.6 13.3 12.4*  HCT 40.0 43.0 39.7 36.9*  MCV 100.3*  --  100.0 101.1*  PLT 245  --  199 461    Basic Metabolic Panel: Recent Labs  Lab 04/27/22 0140 04/27/22 0147 04/28/22 0212 04/29/22 0207 04/30/22 0346 05/01/22 0446 05/02/22 0055   NA 137   < > 137 133* 135 136 132*  K 4.2   < > 3.4* 3.4* 3.9 3.7 3.8  CL 98   < > 97* 95* 95* 93* 95*  CO2 27  --  '29 27 27 29 29  ' GLUCOSE 92   < > 103* 101* 94 92 105*  BUN 21   < > 18 20 27* 27* 28*  CREATININE 1.07   < > 1.01 0.99 1.22 1.07 1.08  CALCIUM 8.6*  --  8.4* 8.0* 8.1* 8.1* 7.9*  MG 2.1  --   --   --   --   --   --    < > = values in this interval not displayed.   GFR: Estimated Creatinine Clearance: 34.9 mL/min (by C-G formula based on SCr of 1.08 mg/dL). Recent Labs  Lab 04/27/22 0140 04/27/22 1309 04/28/22 0212 04/29/22 0207  PROCALCITON  --  <0.10  --   --   WBC 11.4*  --  11.9* 10.3    Liver Function Tests: Recent Labs  Lab 04/27/22 0140 04/28/22 0212  AST 43* 23  ALT 23 16  ALKPHOS 70 66  BILITOT 1.4* 1.4*  PROT 8.0 7.3  ALBUMIN 3.4* 2.9*   Recent Labs  Lab 04/27/22 0140  LIPASE 59*   No results for input(s): "AMMONIA" in the last 168 hours.  ABG    Component Value Date/Time   PHART 7.467 (H) 12/18/2019 1726   PCO2ART 38.8 12/18/2019 1726   PO2ART 70.3 (L) 12/18/2019 1726   HCO3 27.8 12/18/2019 1726   TCO2 31 04/27/2022 0147   O2SAT 97.9 09/16/2020 1337     Coagulation Profile: No results for input(s): "INR", "PROTIME" in the last 168 hours.  Cardiac Enzymes: No results for input(s): "CKTOTAL", "CKMB", "CKMBINDEX", "TROPONINI" in the last 168 hours.  HbA1C: Hgb A1c MFr Bld  Date/Time Value Ref Range Status  09/08/2020 07:40 AM 5.2 4.8 - 5.6 % Final    Comment:    (NOTE) Pre diabetes:          5.7%-6.4%  Diabetes:              >6.4%  Glycemic control for   <7.0% adults with diabetes     CBG: Recent Labs  Lab 04/30/22 0609  GLUCAP 88    Critical care time: n/a    Freda Jackson, MD [facility] Office: [phone]   See Amion for personal pager PCCM on call pager [phone] until 7pm. Please call [facility] 7p-7a. [phone]

## 2022-05-02 NOTE — Assessment & Plan Note (Signed)
Continue levothyroxine 

## 2022-05-02 NOTE — Assessment & Plan Note (Addendum)
High resolution chest CT with interlobular septal thickening, positive traction bronchiectasis. Mild emphysema. Bilateral reticular opacities. Possible UIP.   Continue supportive medical therapy, supplemental 02.  Patient has been placed on prednisone 50 mg daily.  Elevated sed rate and CRP.

## 2022-05-02 NOTE — Assessment & Plan Note (Signed)
Completed treatment.

## 2022-05-02 NOTE — Assessment & Plan Note (Signed)
Continue rate control and anticoagulation,

## 2022-05-02 NOTE — Progress Notes (Signed)
Occupational Therapy Treatment Patient Details Name: NEHEMYAH FOUSHEE MRN: [id number] DOB: 03-29-1923 Today's Date: 05/02/2022   History of present illness Pt is 88 yo male who presents to [facility] on 04/27/22 with CHF exacerbation after a fall at ALF in bathroom. SpO2 was 78% at the time.  PMH: CAD s/p CABG 1998, AAA s/p EVAR 10/09, pulmonary HTN with cor pulmonale, chronic respiratory failure on 3LO2 day and Bipap at night. COPD. bradycardia, paroxysmal Afib, GI bleed, OSA, aortic stenosis, bladder cancer with chronic condom cath.   OT comments  Pt making incremental progress with OT goals. This session focused on transfers, toileting, and standing dynamic balance. He required min to mod A for functional mobility and toileting tasks. He continues to be limited by acitivty tolerance and pain in his sacral area. OT will continue to follow acutely.    Recommendations for follow up therapy are one component of a multi-disciplinary discharge planning process, led by the attending physician.  Recommendations may be updated based on patient status, additional functional criteria and insurance authorization.    Follow Up Recommendations  Skilled nursing-short term rehab (<3 hours/day)    Assistance Recommended at Discharge Frequent or constant Supervision/Assistance  Patient can return home with the following  A lot of help with walking and/or transfers;A lot of help with bathing/dressing/bathroom   Equipment Recommendations  None recommended by OT    Recommendations for Other Services      Precautions / Restrictions Precautions Precautions: Fall Restrictions Weight Bearing Restrictions: No Other Position/Activity Restrictions: R gallbladder drain. Watch O2 sats, norm for him is 87-94% on 3L       Mobility Bed Mobility Overal bed mobility: Needs Assistance Bed Mobility: Supine to Sit     Supine to sit: Min assist, HOB elevated     General bed mobility comments: Needing min A to elevate  trunk    Transfers Overall transfer level: Needs assistance Equipment used: Rolling walker (2 wheels) Transfers: Sit to/from Stand, Bed to chair/wheelchair/BSC Sit to Stand: Min assist     Step pivot transfers: Min assist     General transfer comment: Min A to power up to and take small steps to transfer     Balance Overall balance assessment: Needs assistance Sitting-balance support: Feet supported, No upper extremity supported Sitting balance-Leahy Scale: Good     Standing balance support: Bilateral upper extremity supported, During functional activity, Reliant on assistive device for balance Standing balance-Leahy Scale: Poor                             ADL either performed or assessed with clinical judgement   ADL Overall ADL's : Needs assistance/impaired     Grooming: Wash/dry hands;Wash/dry face;Set up;Sitting Grooming Details (indicate cue type and reason): completed on the [facility]                 Toilet Transfer: Moderate assistance;Stand-pivot Toilet Transfer Details (indicate cue type and reason): Mod A to power up to standing, able to take small steps with min guard to [facility] Toileting- Clothing Manipulation and Hygiene: Minimal assistance;Sitting/lateral lean;Sit to/from stand Toileting - Clothing Manipulation Details (indicate cue type and reason): Pt able to complete pericare leaning on the [facility], requiring assist in standing for thoroughness.     Functional mobility during ADLs: Minimal assistance;Rolling walker (2 wheels) General ADL Comments: Pt with improved mobility this session, completing ADL tasks    Extremity/Trunk Assessment  Vision       Perception     Praxis      Cognition Arousal/Alertness: Awake/alert Behavior During Therapy: WFL for tasks assessed/performed Overall Cognitive Status: Within Functional Limits for tasks assessed                                          Exercises       Shoulder Instructions       General Comments VSS on 8L    Pertinent Vitals/ Pain       Pain Assessment Pain Assessment: Faces Faces Pain Scale: Hurts little more Pain Location: tailbone area and R LB Pain Descriptors / Indicators: Sore Pain Intervention(s): Limited activity within patient's tolerance, Monitored during session, Repositioned  Home Living                                          Prior Functioning/Environment              Frequency  Min 2X/week        Progress Toward Goals  OT Goals(current goals can now be found in the care plan section)  Progress towards OT goals: Progressing toward goals  Acute Rehab OT Goals Patient Stated Goal: To get stronger OT Goal Formulation: With patient Time For Goal Achievement: 05/13/22 Potential to Achieve Goals: Good ADL Goals Pt Will Perform Grooming: with modified independence;standing Pt Will Perform Lower Body Bathing: with modified independence;sitting/lateral leans;sit to/from stand Pt Will Perform Lower Body Dressing: with modified independence;sitting/lateral leans;sit to/from stand Pt Will Transfer to Toilet: with modified independence;ambulating Pt Will Perform Toileting - Clothing Manipulation and hygiene: with modified independence;sitting/lateral leans;sit to/from stand  Plan Discharge plan remains appropriate;Frequency remains appropriate    Co-evaluation                 AM-PAC OT "6 Clicks" Daily Activity     Outcome Measure   Help from another person eating meals?: A Little Help from another person taking care of personal grooming?: A Little Help from another person toileting, which includes using toliet, bedpan, or urinal?: A Lot Help from another person bathing (including washing, rinsing, drying)?: A Lot Help from another person to put on and taking off regular upper body clothing?: A Little Help from another person to put on and taking off regular lower body  clothing?: A Lot 6 Click Score: 15    End of Session Equipment Utilized During Treatment: Oxygen;Rolling walker (2 wheels)  OT Visit Diagnosis: Unsteadiness on feet (R26.81);Other abnormalities of gait and mobility (R26.89);Muscle weakness (generalized) (M62.81)   Activity Tolerance Patient tolerated treatment well   Patient Left in bed;with call bell/phone within reach   Nurse Communication Mobility status        Time: 6384-6659 OT Time Calculation (min): 41 min  Charges: OT General Charges $OT Visit: 1 Visit OT Evaluation $OT Eval Moderate Complexity: 1 Mod OT Treatments $Self Care/Home Management : 8-22 mins $Therapeutic Activity: 8-22 mins  Alane Hanssen H., OTR/L Acute Rehabilitation  Aily Tzeng Elane Harryette Shuart 05/02/2022, 6:08 PM

## 2022-05-03 DIAGNOSIS — I5033 Acute on chronic diastolic (congestive) heart failure: Secondary | ICD-10-CM | POA: Diagnosis not present

## 2022-05-03 DIAGNOSIS — I4819 Other persistent atrial fibrillation: Secondary | ICD-10-CM

## 2022-05-03 DIAGNOSIS — J9621 Acute and chronic respiratory failure with hypoxia: Secondary | ICD-10-CM | POA: Diagnosis not present

## 2022-05-03 DIAGNOSIS — Z4682 Encounter for fitting and adjustment of non-vascular catheter: Secondary | ICD-10-CM

## 2022-05-03 DIAGNOSIS — N3001 Acute cystitis with hematuria: Secondary | ICD-10-CM | POA: Diagnosis not present

## 2022-05-03 DIAGNOSIS — J449 Chronic obstructive pulmonary disease, unspecified: Secondary | ICD-10-CM | POA: Diagnosis not present

## 2022-05-03 DIAGNOSIS — W19XXXA Unspecified fall, initial encounter: Secondary | ICD-10-CM | POA: Diagnosis not present

## 2022-05-03 LAB — CBC
HCT: 31.3 % — ABNORMAL LOW (ref 39.0–52.0)
Hemoglobin: 10.6 g/dL — ABNORMAL LOW (ref 13.0–17.0)
MCH: 33.3 pg (ref 26.0–34.0)
MCHC: 33.9 g/dL (ref 30.0–36.0)
MCV: 98.4 fL (ref 80.0–100.0)
Platelets: 191 10*3/uL (ref 150–400)
RBC: 3.18 MIL/uL — ABNORMAL LOW (ref 4.22–5.81)
RDW: 14.1 % (ref 11.5–15.5)
WBC: 8 10*3/uL (ref 4.0–10.5)
nRBC: 0 % (ref 0.0–0.2)

## 2022-05-03 LAB — BASIC METABOLIC PANEL
Anion gap: 11 (ref 5–15)
BUN: 28 mg/dL — ABNORMAL HIGH (ref 8–23)
CO2: 27 mmol/L (ref 22–32)
Calcium: 8.1 mg/dL — ABNORMAL LOW (ref 8.9–10.3)
Chloride: 97 mmol/L — ABNORMAL LOW (ref 98–111)
Creatinine, Ser: 1.17 mg/dL (ref 0.61–1.24)
GFR, Estimated: 57 mL/min — ABNORMAL LOW (ref >60)
Glucose, Bld: 92 mg/dL (ref 70–99)
Potassium: 3.8 mmol/L (ref 3.5–5.1)
Sodium: 135 mmol/L (ref 135–145)

## 2022-05-03 MED ORDER — PREDNISONE 50 MG PO TABS
50.0000 mg | ORAL_TABLET | Freq: Every day | ORAL | Status: DC
  Administered 2022-05-04 – 2022-05-07 (×4): 50 mg via ORAL
  Filled 2022-05-03 (×4): qty 1

## 2022-05-03 NOTE — Progress Notes (Signed)
MD, pt was scheduled to have his billiary drain changed on Monday July 17th, if he is still admitted he could possibly have it done here?  Thanks Arvella Nigh RN.

## 2022-05-03 NOTE — Assessment & Plan Note (Addendum)
Plan for biliary drain exchange today, before his discharge. Follow up with IR as outpatient

## 2022-05-03 NOTE — Progress Notes (Signed)
Physical Therapy Treatment Patient Details Name: Vincent Robinson MRN: [id number] DOB: 1923-07-21 Today's Date: 05/03/2022   History of Present Illness Pt is 88 yo male who presents to [facility] on 04/27/22 with CHF exacerbation after a fall at ALF in bathroom. SpO2 was 78% at the time.  PMH: CAD s/p CABG 1998, AAA s/p EVAR 10/09, pulmonary HTN with cor pulmonale, chronic respiratory failure on 3LO2 day and Bipap at night. COPD. bradycardia, paroxysmal Afib, GI bleed, OSA, aortic stenosis, bladder cancer with chronic condom cath.    PT Comments    Pt is limited by fatigue and oxygen saturations during therapy today. Pt performs better today than previous session, ambulating limited household distances with an AD and standing at the sink to wash his hands. During ambulation, Pt desaturates to low to mid 80s on 7L O2, but recovers to 89% after sitting for a couple minutes. Continued therapy will facilitate the Pt in improving his strength and activity tolerance, in addition to progressing him back to his baseline level of supplemental O2. Pt's activity tolerance places him at a greater risk of falling, and he needs continued therapy and a higher level of assistance prior to returning home. PT recommends SNF for Pt.    Recommendations for follow up therapy are one component of a multi-disciplinary discharge planning process, led by the attending physician.  Recommendations may be updated based on patient status, additional functional criteria and insurance authorization.  Follow Up Recommendations  Skilled nursing-short term rehab (<3 hours/day) Can patient physically be transported by private vehicle: Yes   Assistance Recommended at Discharge Frequent or constant Supervision/Assistance  Patient can return home with the following A little help with walking and/or transfers;A little help with bathing/dressing/bathroom;Assistance with cooking/housework;Direct supervision/assist for medications management;Direct  supervision/assist for financial management;Assist for transportation;Help with stairs or ramp for entrance   Equipment Recommendations  None recommended by PT    Recommendations for Other Services       Precautions / Restrictions Precautions Precautions: Fall Restrictions Weight Bearing Restrictions: No Other Position/Activity Restrictions: R gallbladder drain. Watch O2 sats, norm for him is 87-94% on 3L     Mobility  Bed Mobility Overal bed mobility: Needs Assistance Bed Mobility: Sit to Supine       Sit to supine: Min assist   General bed mobility comments: PT supports Pt legs    Transfers Overall transfer level: Needs assistance Equipment used: Rolling walker (2 wheels) Transfers: Sit to/from Stand Sit to Stand: Min guard   Step pivot transfers: Min guard (With RW)            Ambulation/Gait Ambulation/Gait assistance: Min guard Gait Distance (Feet): 20 Feet Assistive device: Rolling walker (2 wheels) Gait Pattern/deviations: Step-to pattern, Decreased step length - right, Decreased step length - left, Decreased stride length Gait velocity: decreased Gait velocity interpretation: <1.31 ft/sec, indicative of household ambulator   General Gait Details: Pt desats to low to mid 80s on 7L O2 during ambulation; recovers to 89% after a couple minutes seated   Stairs             Wheelchair Mobility    Modified Rankin (Stroke Patients Only)       Balance Overall balance assessment: Needs assistance Sitting-balance support: Feet supported, No upper extremity supported Sitting balance-Leahy Scale: Good     Standing balance support: Bilateral upper extremity supported, During functional activity, Reliant on assistive device for balance Standing balance-Leahy Scale: Poor  Cognition Arousal/Alertness: Awake/alert Behavior During Therapy: Impulsive Overall Cognitive Status: No family/caregiver present to  determine baseline cognitive functioning                                 General Comments: Pt alert and oriented, however, he makes comments throughout the session that suggest his memory may be impaired.        Exercises      General Comments General comments (skin integrity, edema, etc.): Pt received and ambulates on 7L O2; Pt desats to low to mid 80s during ambulation but recovers to 89% after being seated for a couple minutes      Pertinent Vitals/Pain Pain Assessment Pain Assessment: 0-10 Pain Score: 2  Pain Location: R hip Pain Descriptors / Indicators: Aching Pain Intervention(s): Monitored during session    Home Living                          Prior Function            PT Goals (current goals can now be found in the care plan section) Acute Rehab PT Goals Patient Stated Goal: return home PT Goal Formulation: With patient Time For Goal Achievement: 05/12/22 Potential to Achieve Goals: Good Progress towards PT goals: Progressing toward goals    Frequency    Min 3X/week      PT Plan Current plan remains appropriate    Co-evaluation              AM-PAC PT "6 Clicks" Mobility   Outcome Measure  Help needed turning from your back to your side while in a flat bed without using bedrails?: None Help needed moving from lying on your back to sitting on the side of a flat bed without using bedrails?: A Little Help needed moving to and from a bed to a chair (including a wheelchair)?: A Little Help needed standing up from a chair using your arms (e.g., wheelchair or bedside chair)?: A Little Help needed to walk in hospital room?: A Little Help needed climbing 3-5 steps with a railing? : Total 6 Click Score: 17    End of Session Equipment Utilized During Treatment: Oxygen Activity Tolerance: Treatment limited secondary to medical complications (Comment) (Pt limited by O2 saturations) Patient left: in bed;with bed alarm set;with  call bell/phone within reach Nurse Communication: Mobility status PT Visit Diagnosis: Unsteadiness on feet (R26.81);Muscle weakness (generalized) (M62.81);History of falling (Z91.81);Difficulty in walking, not elsewhere classified (R26.2)     Time: 1350-1420 PT Time Calculation (min) (ACUTE ONLY): 30 min  Charges:  $Gait Training: 8-22 mins $Therapeutic Activity: 8-22 mins                     Hall Busing, SPT Acute Rehabilitation Office #: [phone]    Hall Busing 05/03/2022, 2:55 PM

## 2022-05-03 NOTE — Progress Notes (Signed)
NAME:  Vincent Robinson, MRN:  [id number], DOB:  13-Aug-1923, LOS: 6 ADMISSION DATE:  04/27/2022, CONSULTATION DATE:  05/03/22 REFERRING MD:  Verdell Carmine, CHIEF COMPLAINT:  shortness of breath  History of Present Illness:  Vincent Robinson is a 88 y.o. M with PMH significant for COPD on home O2, CAD,  OSA on Bipap, HFpEF, pulmonary HTN, post-inflammatory lung fibrosis and atrial fibrillation followed by Dr. Vaughan Browner who presented initially after a mechanical fall at home. He was short of breath and admission and work-up consistent with pulmonary edema.  He was admitted and seen by the heart failure team and diuresed.  He is on 3L baseline home O2, oxygen requirements remained above baseline at 9L after diuresis, so PCCM consulted.   Pt is sitting up in bed and states that he is not feeling significantly short of breath.  He has occasionally tightness across the chest and has been coughing up green/whitish sputum.  Denies fever or wheezing.   Pertinent  Medical History   has a past medical history of AAA (abdominal aortic aneurysm) ([place]), Acute respiratory failure ([place]) (11/23/2019), Asthma, Basal cell carcinoma, CHF (congestive heart failure) ([place]), Chronic respiratory failure ([place]), COPD (chronic obstructive pulmonary disease) ([place]), Coronary artery disease, Diverticulitis, Dysrhythmia, History of chronic respiratory failure, Hyperlipidemia, Hypothyroidism, Moderate aortic stenosis, Myocardial infarction ([place]), Pneumonia, Pulmonary HTN ([place]), Right heart failure ([place]), Skin cancer (2017), and Sleep apnea.   Significant Hospital Events: Including procedures, antibiotic start and stop dates in addition to other pertinent events   7/7 admit after mechanical fall with shortness of breath 7/10 remains on 9L o2, HRCT obtained and shows stable ILD and small R pleural effusion  Interim History / Subjective:   No acute events overnight.  He has been weaned to 6L O2 today He is feeling better today. He is wanting to  walk around today.  Objective   Blood pressure 93/63, pulse 63, temperature 97.8 F (36.6 C), temperature source Oral, resp. rate 18, height $RemoveBe'5\' 7"'mzkkBsuEB$  (1.702 m), weight 61 kg, SpO2 100 %.        Intake/Output Summary (Last 24 hours) at 05/03/2022 1043 Last data filed at 05/03/2022 0834 Gross per 24 hour  Intake 558 ml  Output 1025 ml  Net -467 ml   Filed Weights   05/01/22 0053 05/02/22 0420 05/03/22 0500  Weight: 62.2 kg 61.6 kg 61 kg    General:  elderly male, no acute distress, resting in bed HEENT: MM pink/moist, [place] in place, sclera anicteric  Neuro: awake, alert and oriented, following commands, non-focal CV: s1s2 rrr, no m/r/g PULM:  bibasilar rales, good air movement, no wheezing or rhonchi, no distress GI: soft, non-tender Extremities: warm/dry, no edema  Skin: no rashes or lesions   Resolved Hospital Problem list     Assessment & Plan:   Acute on Chronic Hypoxemic Respiratory Failure Mild ILD COPD Pulmonary Hypertension Atrial Fibrillation Possible Pneumonia  - Continue to wean supplemental oxygen for goal SpO2 88-92% - 7 days ceftriaxone completed yesterday and 5 day course of azithromycin to finish 7/14 - Continue budesonide, yupelri and brovana nebs - CRP and ESR mildly elevated, will start prednisone $RemoveBeforeDE'50mg'[id number]$  daily for possible ILD involvement - Encourage IS and mobilization - PT/OT - Diuresis per cardiology  PCCM will continue to follow  Best Practice (right click and "Reselect all SmartList Selections" daily)   Per primary  Labs   CBC: Recent Labs  Lab 04/27/22 0140 04/27/22 0147 04/28/22 0212 04/29/22 0207 05/03/22 0311  WBC 11.4*  --  11.9* 10.3 8.0  NEUTROABS 8.2*  --   --   --   --   HGB 13.5 14.6 13.3 12.4* 10.6*  HCT 40.0 43.0 39.7 36.9* 31.3*  MCV 100.3*  --  100.0 101.1* 98.4  PLT 245  --  199 167 474    Basic Metabolic Panel: Recent Labs  Lab 04/27/22 0140 04/27/22 0147 04/29/22 0207 04/30/22 0346 05/01/22 0446  05/02/22 0055 05/03/22 0311  NA 137   < > 133* 135 136 132* 135  K 4.2   < > 3.4* 3.9 3.7 3.8 3.8  CL 98   < > 95* 95* 93* 95* 97*  CO2 27   < > _0 GLUCOSE 92   < > 101* 94 92 105* 92  BUN 21   < > 20 27* 27* 28* 28*  CREATININE 1.07   < > 0.99 1.22 1.07 1.08 1.17  CALCIUM 8.6*   < > 8.0* 8.1* 8.1* 7.9* 8.1*  MG 2.1  --   --   --   --   --   --    < > = values in this interval not displayed.   GFR: Estimated Creatinine Clearance: 31.9 mL/min (by C-G formula based on SCr of 1.17 mg/dL). Recent Labs  Lab 04/27/22 0140 04/27/22 1309 04/28/22 0212 04/29/22 0207 05/03/22 0311  PROCALCITON  --  <0.10  --   --   --   WBC 11.4*  --  11.9* 10.3 8.0    Liver Function Tests: Recent Labs  Lab 04/27/22 0140 04/28/22 0212  AST 43* 23  ALT 23 16  ALKPHOS 70 66  BILITOT 1.4* 1.4*  PROT 8.0 7.3  ALBUMIN 3.4* 2.9*   Recent Labs  Lab 04/27/22 0140  LIPASE 59*   No results for input(s): "AMMONIA" in the last 168 hours.  ABG    Component Value Date/Time   PHART 7.467 (H) 12/18/2019 1726   PCO2ART 38.8 12/18/2019 1726   PO2ART 70.3 (L) 12/18/2019 1726   HCO3 27.8 12/18/2019 1726   TCO2 31 04/27/2022 0147   O2SAT 97.9 09/16/2020 1337     Coagulation Profile: No results for input(s): "INR", "PROTIME" in the last 168 hours.  Cardiac Enzymes: No results for input(s): "CKTOTAL", "CKMB", "CKMBINDEX", "TROPONINI" in the last 168 hours.  HbA1C: Hgb A1c MFr Bld  Date/Time Value Ref Range Status  09/08/2020 07:40 AM 5.2 4.8 - 5.6 % Final    Comment:    (NOTE) Pre diabetes:          5.7%-6.4%  Diabetes:              >6.4%  Glycemic control for   <7.0% adults with diabetes     CBG: Recent Labs  Lab 04/30/22 0609  GLUCAP 88    Critical care time: n/a    Vincent Jackson, MD [facility] Office: [phone]   See Amion for personal pager PCCM on call pager [phone] until 7pm. Please call Elink 7p-7a. [phone]

## 2022-05-03 NOTE — Progress Notes (Addendum)
Progress Note   Patient: Vincent Robinson PJA:[id number] DOB: [birth year] DOA: 04/27/2022     6 DOS: the patient was seen and examined on 05/03/2022   Brief hospital course: Vincent Robinson was admitted to the hospital with the working diagnosis of acute on chronic hypoxemic respiratory failure due to heart failure decompensation and pneumonia.   89/M with history of chronic diastolic CHF, pulmonary hypertension, cor pulmonale, COPD, CAD/CABG, interstitial lung disease, chronic hypoxic respiratory failure on 3 L home O2, paroxysmal A-fib, chronic hypotension on midodrine, hypothyroidism, abdominal aortic aneurysm status post endovascular repair, chronic cholecystitis with drain, bladder cancer, OSA on CPAP was brought to the ED after a fall. Patient sustained a mechanical fall in the bathroom while emptying his condom cathter. Positive hip pain after fall. Positive productive cough for a few days and progressive dyspnea since the fall.   On his initial physical examination his blood pressure was 93/50, RR 15 to 38, and in respiratory distress, placed on Bipap. Lungs with no wheezing but positive rales, heart with S1 and S2 present irregularly irregular, abdomen not distended and positive lower extremity edema.   Na 135, K 5.1 CL 96 glucose 95 bunt 30 cr 1,0  Wbc 11,4 hgb 13,5 plt 245  Urine analysis SG 1,009, negative protein, >50 wbc   Chest radiograph with cardiomegaly and increase lung markings bilaterally.   EKG 87 bpm, right axis deviation, right bundle branch block, atrial fibrillation with multifocal PVC, no significant ST segment or  T wave changes.   Patient was placed on antibiotic therapy and diuresis. Wean off from Bipap to nasal cannula with good toleration. Continue with high oxygen requirements.      Assessment and Plan: Acute on chronic diastolic CHF (congestive heart failure) (HCC) Echocardiogram with preserved LV systolic function., EF 60 to 65%. Moderate LVH, interventricular  septum flattened in systole and diastole. RV systolic function with severe reduction. Severe RV cavity dilatation. RVSP 84,6 severer dilatation of RA and LA. Small pericardial effusion, mild to moderate MR, moderate to severe TR, moderate aortic stenosis.   Acute on chronic core pulmonale.   Improving volume status.   Medical therapy with midodrine and diuresis with torsemide.   Continue with macitentan and sildenafil.   ILD (interstitial lung disease) (HCC) High resolution chest CT with interlobular septal thickening, positive traction bronchiectasis. Mild emphysema. Bilateral reticular opacities. Possible UIP.   Continue supportive medical therapy, supplemental 02.  No clinical signs of ILD flare.  Patient has been placed on prednisone 50 mg daily.  Elevated sed rate and CRP.  COPD (chronic obstructive pulmonary disease) (HCC) Pneumonia.   Patient continue with high oxygen requirements up to 7 L/min with 02 saturation 99%   Plan to continue antibiotic therapy with ceftriaxone and azithromycin Continue bronchodilator therapy and inhaled steroids.  Wean off supplemental 02 as tolerated. At home uses 3 L/min per [place].   UTI (urinary tract infection) Completed treatment.   Atrial fibrillation ([place]) Continue rate control and anticoagulation,   Fall at home, initial encounter Ambulatory dysfunction, continue pt and ot  Patient will be discharge to SNF when medically stable.   Hypothyroidism Continue levothyroxine   Encounter for biliary drainage tube placement Will call IR, for routine check.     Subjective: Patient with slowly improvement in dyspnea, no chest pain, hip pain is improving, along with mobility   Physical Exam: Vitals:   05/02/22 2340 05/03/22 0500 05/03/22 0830 05/03/22 1050  BP:  1'08/60 93/63 97/70 '$  Pulse: (!) 53 (!)  51 63 61  Resp: (!) 30 (!) '24 18 18  '$ Temp:  97.8 F (36.6 C) 97.8 F (36.6 C) 98 F (36.7 C)  TempSrc:  Oral Oral Oral  SpO2: 97% 100%   99%  Weight:  61 kg    Height:       Neurology awake and alert ENT with no pallor Cardiovascular with S1 and S2 present irregularly irregular, no gallops No JVD No lower extremity edema Respiratory with bilateral rales but not wheezing Abdomen not distended  Data Reviewed:    Family Communication: no family at the bedside I spoke over the phone with the patient's daughter about patient's  condition, plan of care, prognosis and all questions were addressed.   Disposition: Status is: Inpatient Remains inpatient appropriate because: respiratory failure   Planned Discharge Destination: SNF    Author: Tawni Millers, MD 05/03/2022 3:18 PM  For on call review [email or web address].

## 2022-05-04 DIAGNOSIS — Z4682 Encounter for fitting and adjustment of non-vascular catheter: Secondary | ICD-10-CM

## 2022-05-04 DIAGNOSIS — J849 Interstitial pulmonary disease, unspecified: Secondary | ICD-10-CM | POA: Diagnosis not present

## 2022-05-04 DIAGNOSIS — N3001 Acute cystitis with hematuria: Secondary | ICD-10-CM | POA: Diagnosis not present

## 2022-05-04 DIAGNOSIS — J449 Chronic obstructive pulmonary disease, unspecified: Secondary | ICD-10-CM | POA: Diagnosis not present

## 2022-05-04 DIAGNOSIS — I5033 Acute on chronic diastolic (congestive) heart failure: Secondary | ICD-10-CM | POA: Diagnosis not present

## 2022-05-04 DIAGNOSIS — W19XXXA Unspecified fall, initial encounter: Secondary | ICD-10-CM | POA: Diagnosis not present

## 2022-05-04 LAB — BASIC METABOLIC PANEL
Anion gap: 10 (ref 5–15)
BUN: 26 mg/dL — ABNORMAL HIGH (ref 8–23)
CO2: 28 mmol/L (ref 22–32)
Calcium: 8.2 mg/dL — ABNORMAL LOW (ref 8.9–10.3)
Chloride: 97 mmol/L — ABNORMAL LOW (ref 98–111)
Creatinine, Ser: 1.02 mg/dL (ref 0.61–1.24)
GFR, Estimated: >60 mL/min (ref >60)
Glucose, Bld: 82 mg/dL (ref 70–99)
Potassium: 3.7 mmol/L (ref 3.5–5.1)
Sodium: 135 mmol/L (ref 135–145)

## 2022-05-04 NOTE — Progress Notes (Signed)
NAME:  BABE CLENNEY, MRN:  [id number], DOB:  01/12/1923, LOS: 7 ADMISSION DATE:  04/27/2022, CONSULTATION DATE:  05/04/22 REFERRING MD:  Verdell Carmine, CHIEF COMPLAINT:  shortness of breath  History of Present Illness:  Jaishawn Witzke is a 88 y.o. M with PMH significant for COPD on home O2, CAD,  OSA on Bipap, HFpEF, pulmonary HTN, post-inflammatory lung fibrosis and atrial fibrillation followed by Dr. Vaughan Browner who presented initially after a mechanical fall at home. He was short of breath and admission and work-up consistent with pulmonary edema.  He was admitted and seen by the heart failure team and diuresed.  He is on 3L baseline home O2, oxygen requirements remained above baseline at 9L after diuresis, so PCCM consulted.   Pt is sitting up in bed and states that he is not feeling significantly short of breath.  He has occasionally tightness across the chest and has been coughing up green/whitish sputum.  Denies fever or wheezing.   Pertinent  Medical History   has a past medical history of AAA (abdominal aortic aneurysm) ([place]), Acute respiratory failure ([place]) (11/23/2019), Asthma, Basal cell carcinoma, CHF (congestive heart failure) ([place]), Chronic respiratory failure ([place]), COPD (chronic obstructive pulmonary disease) ([place]), Coronary artery disease, Diverticulitis, Dysrhythmia, History of chronic respiratory failure, Hyperlipidemia, Hypothyroidism, Moderate aortic stenosis, Myocardial infarction ([place]), Pneumonia, Pulmonary HTN ([place]), Right heart failure ([place]), Skin cancer (2017), and Sleep apnea.   Significant Hospital Events: Including procedures, antibiotic start and stop dates in addition to other pertinent events   7/7 admit after mechanical fall with shortness of breath 7/10 remains on 9L o2, HRCT obtained and shows stable ILD and small R pleural effusion 714 started on steroids  Interim History / Subjective:   No acute distress  Objective   Blood pressure (!) 92/58, pulse 71, temperature  98.1 F (36.7 C), temperature source Oral, resp. rate 20, height '5\' 7"'$  (1.702 m), weight 61.6 kg, SpO2 98 %.    FiO2 (%):  [50 %] 50 %   Intake/Output Summary (Last 24 hours) at 05/04/2022 0912 Last data filed at 05/04/2022 0855 Gross per 24 hour  Intake 240 ml  Output 1350 ml  Net -1110 ml   Filed Weights   05/02/22 0420 05/03/22 0500 05/04/22 0426  Weight: 61.6 kg 61 kg 61.6 kg    General: Elderly male no acute distress currently on the pot HEENT: MM pink/moist ABD Neuro: Other than hard of hearing grossly intact CV: Heart sounds are regular PULM: Diminished 06 L nasal cannula GI: soft, bsx4 active biliary drain in place with dark amber fluid GU: Amber Extremities: warm/dry,  edema  Skin: no rashes or lesions    Resolved Hospital Problem list     Assessment & Plan:   Acute on Chronic Hypoxemic Respiratory Failure Mild ILD COPD Pulmonary Hypertension Atrial Fibrillation Possible Pneumonia  Antibiotics to finish 05/04/2022 Started on prednisone for possible ILD diagnosed Continue bronchodilators Pulmonary follow-up Diuresis per cardiology     PCCM will continue to follow  Best Practice (right click and "Reselect all SmartList Selections" daily)   Per primary  Labs   CBC: Recent Labs  Lab 04/28/22 0212 04/29/22 0207 05/03/22 0311  WBC 11.9* 10.3 8.0  HGB 13.3 12.4* 10.6*  HCT 39.7 36.9* 31.3*  MCV 100.0 101.1* 98.4  PLT 199 167 989    Basic Metabolic Panel: Recent Labs  Lab 04/30/22 0346 05/01/22 0446 05/02/22 0055 05/03/22 0311 05/04/22 0444  NA 135 136 132* 135 135  K 3.9 3.7 3.8  3.8 3.7  CL 95* 93* 95* 97* 97*  CO2 '27 29 29 27 28  '$ GLUCOSE 94 92 105* 92 82  BUN 27* 27* 28* 28* 26*  CREATININE 1.22 1.07 1.08 1.17 1.02  CALCIUM 8.1* 8.1* 7.9* 8.1* 8.2*   GFR: Estimated Creatinine Clearance: 36.9 mL/min (by C-G formula based on SCr of 1.02 mg/dL). Recent Labs  Lab 04/27/22 1309 04/28/22 0212 04/29/22 0207 05/03/22 0311   PROCALCITON <0.10  --   --   --   WBC  --  11.9* 10.3 8.0    Liver Function Tests: Recent Labs  Lab 04/28/22 0212  AST 23  ALT 16  ALKPHOS 66  BILITOT 1.4*  PROT 7.3  ALBUMIN 2.9*   No results for input(s): "LIPASE", "AMYLASE" in the last 168 hours.  No results for input(s): "AMMONIA" in the last 168 hours.  ABG    Component Value Date/Time   PHART 7.467 (H) 12/18/2019 1726   PCO2ART 38.8 12/18/2019 1726   PO2ART 70.3 (L) 12/18/2019 1726   HCO3 27.8 12/18/2019 1726   TCO2 31 04/27/2022 0147   O2SAT 97.9 09/16/2020 1337     Coagulation Profile: No results for input(s): "INR", "PROTIME" in the last 168 hours.  Cardiac Enzymes: No results for input(s): "CKTOTAL", "CKMB", "CKMBINDEX", "TROPONINI" in the last 168 hours.  HbA1C: Hgb A1c MFr Bld  Date/Time Value Ref Range Status  09/08/2020 07:40 AM 5.2 4.8 - 5.6 % Final    Comment:    (NOTE) Pre diabetes:          5.7%-6.4%  Diabetes:              >6.4%  Glycemic control for   <7.0% adults with diabetes     CBG: Recent Labs  Lab 04/30/22 0609  GLUCAP 88    Critical care time: n/a    Richardson Landry Otillia Cordone ACNP Acute Care Nurse Practitioner [place] Please consult [place] 05/04/2022, 9:12 AM

## 2022-05-04 NOTE — Progress Notes (Signed)
Progress Note   Patient: CHRISTEN BEDOYA [id number]:[id number] DOB: 1923-10-06 DOA: 04/27/2022     7 DOS: the patient was seen and examined on 05/04/2022   Brief hospital course: Mr. Mcfarren was admitted to the hospital with the working diagnosis of acute on chronic hypoxemic respiratory failure due to heart failure decompensation and pneumonia.   89/M with history of chronic diastolic CHF, pulmonary hypertension, cor pulmonale, COPD, CAD/CABG, interstitial lung disease, chronic hypoxic respiratory failure on 3 L home O2, paroxysmal A-fib, chronic hypotension on midodrine, hypothyroidism, abdominal aortic aneurysm status post endovascular repair, chronic cholecystitis with drain, bladder cancer, OSA on CPAP was brought to the ED after a fall. Patient sustained a mechanical fall in the bathroom while emptying his condom cathter. Positive hip pain after fall. Positive productive cough for a few days and progressive dyspnea since the fall.   On his initial physical examination his blood pressure was 93/50, RR 15 to 38, and in respiratory distress, placed on Bipap. Lungs with no wheezing but positive rales, heart with S1 and S2 present irregularly irregular, abdomen not distended and positive lower extremity edema.   Na 135, K 5.1 CL 96 glucose 95 bunt 30 cr 1,0  Wbc 11,4 hgb 13,5 plt 245  Urine analysis SG 1,009, negative protein, >50 wbc   Chest radiograph with cardiomegaly and increase lung markings bilaterally.   EKG 87 bpm, right axis deviation, right bundle branch block, atrial fibrillation with multifocal PVC, no significant ST segment or  T wave changes.   Patient was placed on antibiotic therapy and diuresis. Wean off from Bipap to nasal cannula with good toleration. Continue with high oxygen requirements.   07/14 inflammatory markers elevated and patient was started on systemic steroids.  Plan to transfer to SNF when maintaining 02 saturation 88% or greater on 5 L/min per [place].   Assessment and  Plan: Acute on chronic diastolic CHF (congestive heart failure) (HCC) Echocardiogram with preserved LV systolic function., EF 60 to 65%. Moderate LVH, interventricular septum flattened in systole and diastole. RV systolic function with severe reduction. Severe RV cavity dilatation. RVSP 84,6 severer dilatation of RA and LA. Small pericardial effusion, mild to moderate MR, moderate to severe TR, moderate aortic stenosis.   Acute on chronic core pulmonale.   Improving volume status.  Urine output documented 0,272 ml Systolic blood pressure is 90 to 100 mmHg.   Continue withmidodrine and diuresis with torsemide.   Continue with macitentan and sildenafil for pulmonary hypertension.   ILD (interstitial lung disease) (HCC) High resolution chest CT with interlobular septal thickening, positive traction bronchiectasis. Mild emphysema. Bilateral reticular opacities. Possible UIP.   Continue supportive medical therapy, supplemental 02.  Patient has been placed on prednisone 50 mg daily.  Elevated sed rate and CRP.  COPD (chronic obstructive pulmonary disease) (HCC) Pneumonia.   Dyspnea continue to improve, I have reduced supplemental 02 per [place] to 5 L per min with 02 saturation 88%.   Patient has completed antibiotic therapy with ceftriaxone and azithromycin Continue bronchodilator therapy and inhaled steroids.   Keep 02 saturation 88% or greater.    UTI (urinary tract infection) Completed treatment.   Atrial fibrillation ([place]) Continue rate control and anticoagulation,   Fall at home, initial encounter Ambulatory dysfunction, continue pt and ot  Patient will be discharge to SNF when medically stable.   Hypothyroidism Continue levothyroxine   Encounter for biliary drainage tube placement Will call IR, for routine check.          Subjective:  patient with no chest pain, dyspnea continue to improve, no chest pain.   Physical Exam: Vitals:   05/04/22 0426 05/04/22 0736  05/04/22 0746 05/04/22 1127  BP: (!) 90/55  (!) 92/58 100/63  Pulse: 81  71 67  Resp: '20  20 20  '$ Temp: 98.3 F (36.8 C)  98.1 F (36.7 C) 98.2 F (36.8 C)  TempSrc: Oral  Oral Oral  SpO2: 98% 90% 98% 94%  Weight: 61.6 kg     Height:       Neurology awake and alert ENT with mild pallor Cardiovascular with S1 and S2 present and rhythmic Respiratory with bilateral rales but not wheezing or rhonchi Abdomen not distended No lower extremity edema  Data Reviewed:    Family Communication: no family at the bedside   Disposition: Status is: Inpatient Remains inpatient appropriate because: respiratory failure   Planned Discharge Destination: Skilled nursing facility     Author: Tawni Millers, MD 05/04/2022 1:17 PM  For on call review [email or web address].

## 2022-05-04 NOTE — TOC Progression Note (Addendum)
Transition of Care [facility]) - Progression Note    Patient Details  Name: Vincent Robinson MRN: [id number] Date of Birth: [birth year]  Transition of [facility] CM/SW [place], [place] Phone Number: 05/04/2022, 2:40 PM  Clinical Narrative:     Spoke with MD who anticipates possible DC on Monday 05/07/22. Spoke with [facility] and they may have a bed on Monday but will not know until that day. CSW requested [facility] review referral. [facility] previously denied due to bed availability though liaison stated they may have a bed and will review referral again.   1430: Notified by [facility] that they can accept pt on Monday though have a 45$ daily fee for private room that insurance does not cover. CSW discussed this with pt; he would like to go to [place] and is okay with out of pocket fee. CSW updated pt's daughter. She would be able to bring BIPAP from home to SNF; also states she would bring pt's Opsumit if needed. Daughter can transport at time of DC and has portable O2 tank for pt during transport.   Expected Discharge Plan: [place] Barriers to Discharge: Continued Medical Work up  Expected Discharge Plan and Services Expected Discharge Plan: [place] In-house Referral: Clinical Social Work Discharge Planning Services: CM Consult Post Acute Care Choice: [place] arrangements for the past 2 months: Apartment                                       Social Determinants of Health (SDOH) Interventions    Readmission Risk Interventions    07/11/2021   12:38 PM 09/19/2020    3:18 PM 03/25/2020   11:02 AM  Readmission Risk Prevention Plan  Transportation Screening Complete Complete Complete  HRI or Home Care Consult  Complete Patient refused  Social Work Consult for [place] Planning/Counseling  Complete   Palliative Care Screening  Complete Not Applicable  Medication Review Press photographer) Complete  Complete Complete  PCP or Specialist appointment within 3-5 days of discharge Complete    HRI or [place] Complete    SW Recovery Care/Counseling Consult Complete    [place] Not Applicable

## 2022-05-05 DIAGNOSIS — N3001 Acute cystitis with hematuria: Secondary | ICD-10-CM | POA: Diagnosis not present

## 2022-05-05 DIAGNOSIS — J849 Interstitial pulmonary disease, unspecified: Secondary | ICD-10-CM | POA: Diagnosis not present

## 2022-05-05 DIAGNOSIS — I5033 Acute on chronic diastolic (congestive) heart failure: Secondary | ICD-10-CM | POA: Diagnosis not present

## 2022-05-05 DIAGNOSIS — J449 Chronic obstructive pulmonary disease, unspecified: Secondary | ICD-10-CM | POA: Diagnosis not present

## 2022-05-05 NOTE — Progress Notes (Signed)
Progress Note   Patient: Vincent Robinson [id number]:[id number] DOB: May 27, 1923 DOA: 04/27/2022     8 DOS: the patient was seen and examined on 05/05/2022   Brief hospital course: Mr. Tollison was admitted to the hospital with the working diagnosis of acute on chronic hypoxemic respiratory failure due to heart failure decompensation and pneumonia.   89/M with history of chronic diastolic CHF, pulmonary hypertension, cor pulmonale, COPD, CAD/CABG, interstitial lung disease, chronic hypoxic respiratory failure on 3 L home O2, paroxysmal A-fib, chronic hypotension on midodrine, hypothyroidism, abdominal aortic aneurysm status post endovascular repair, chronic cholecystitis with drain, bladder cancer, OSA on CPAP was brought to the ED after a fall. Patient sustained a mechanical fall in the bathroom while emptying his condom cathter. Positive hip pain after fall. Positive productive cough for a few days and progressive dyspnea since the fall.   On his initial physical examination his blood pressure was 93/50, RR 15 to 38, and in respiratory distress, placed on Bipap. Lungs with no wheezing but positive rales, heart with S1 and S2 present irregularly irregular, abdomen not distended and positive lower extremity edema.   Na 135, K 5.1 CL 96 glucose 95 bunt 30 cr 1,0  Wbc 11,4 hgb 13,5 plt 245  Urine analysis SG 1,009, negative protein, >50 wbc   Chest radiograph with cardiomegaly and increase lung markings bilaterally.   EKG 87 bpm, right axis deviation, right bundle branch block, atrial fibrillation with multifocal PVC, no significant ST segment or  T wave changes.   Patient was placed on antibiotic therapy and diuresis. Wean off from Bipap to nasal cannula with good toleration. Continue with high oxygen requirements.   07/14 inflammatory markers elevated and patient was started on systemic steroids.  Plan to transfer to SNF when maintaining 02 saturation 88% or greater on 5 L/min per [place].   07/15 continue  to improve oxygenation, today is on 4 L/min per [place].   Assessment and Plan: Acute on chronic diastolic CHF (congestive heart failure) (HCC) Echocardiogram with preserved LV systolic function., EF 60 to 65%. Moderate LVH, interventricular septum flattened in systole and diastole. RV systolic function with severe reduction. Severe RV cavity dilatation. RVSP 84,6 severer dilatation of RA and LA. Small pericardial effusion, mild to moderate MR, moderate to severe TR, moderate aortic stenosis.   Acute on chronic core pulmonale.   Blood pressure systolic 80 to 90.   On midodrine for blood pressure support and diuresis with torsemide.   Keep MAP 65  Continue with macitentan and sildenafil for pulmonary hypertension.   ILD (interstitial lung disease) ([place]) Acute on chronic hypoxemic respiratory failure.  High resolution chest CT with interlobular septal thickening, positive traction bronchiectasis. Mild emphysema. Bilateral reticular opacities. Possible UIP.   Continue supportive medical therapy, supplemental 02.  Patient has been placed on prednisone 50 mg daily.  Elevated sed rate and CRP. Will plan for a slow prednisone taper, may need PJP prophylaxis, will check with pulmonary.   COPD (chronic obstructive pulmonary disease) (HCC) Pneumonia (comminuty acquired and present on admission).   Patient has completed antibiotic therapy with ceftriaxone and azithromycin Continue bronchodilator therapy and inhaled steroids.   Keep 02 saturation 88% or greater.    UTI (urinary tract infection) Completed treatment.   Atrial fibrillation ([place]) Continue rate control and anticoagulation,   Fall at home, initial encounter Ambulatory dysfunction, continue pt and ot  Patient will be discharge to SNF when medically stable.   Hypothyroidism Continue levothyroxine   Encounter for biliary drainage  tube placement Plan for biliary drain exchange on Monday.         Subjective: Patient with no  chest pain, dyspnea is improving and he has been more mobile.   Physical Exam: Vitals:   05/04/22 2358 05/05/22 0406 05/05/22 0743 05/05/22 0759  BP: (!) 91/47 (!) 90/48 (!) 99/51   Pulse: (!) 44 (!) 46 (!) 54   Resp: '17 17 17   '$ Temp: 97.6 F (36.4 C) 97.6 F (36.4 C) 97.7 F (36.5 C)   TempSrc: Oral Oral Oral   SpO2: 100% 96% 91% 90%  Weight:  66.4 kg    Height:       Neurology awake and alert ENT with mils pallor Cardiovascular with S1 and S2 present, rhythmic, positive systolic murmur 3/6 at the apex No JVD Lungs with scattered raled but no wheezing Abdomen not distended No lower extremity edema.  Data Reviewed:    Family Communication: no family at the bedside   Disposition: Status is: Inpatient Remains inpatient appropriate because: respiratory failure   Planned Discharge Destination: Skilled nursing facility     Author: Tawni Millers, MD 05/05/2022 10:40 AM  For on call review [email or web address].

## 2022-05-05 NOTE — Progress Notes (Signed)
NAME:  Vincent Robinson, MRN:  [id number], DOB:  [birth year], LOS: 8 ADMISSION DATE:  04/27/2022, CONSULTATION DATE:  05/05/22 REFERRING MD:  Verdell Carmine, CHIEF COMPLAINT:  shortness of breath  History of Present Illness:  Vincent Robinson is a 88 y.o. M with PMH significant for COPD on home O2, CAD,  OSA on Bipap, HFpEF, pulmonary HTN, post-inflammatory lung fibrosis and atrial fibrillation followed by Dr. Vaughan Browner who presented initially after a mechanical fall at home. He was short of breath and admission and work-up consistent with pulmonary edema.  He was admitted and seen by the heart failure team and diuresed.  He is on 3L baseline home O2, oxygen requirements remained above baseline at 9L after diuresis, so PCCM consulted.   Pt is sitting up in bed and states that he is not feeling significantly short of breath.  He has occasionally tightness across the chest and has been coughing up green/whitish sputum.  Denies fever or wheezing.   Pertinent  Medical History   has a past medical history of AAA (abdominal aortic aneurysm) ([place]), Acute respiratory failure ([place]) (11/23/2019), Asthma, Basal cell carcinoma, CHF (congestive heart failure) ([place]), Chronic respiratory failure ([place]), COPD (chronic obstructive pulmonary disease) ([place]), Coronary artery disease, Diverticulitis, Dysrhythmia, History of chronic respiratory failure, Hyperlipidemia, Hypothyroidism, Moderate aortic stenosis, Myocardial infarction ([place]), Pneumonia, Pulmonary HTN ([place]), Right heart failure ([place]), Skin cancer (2017), and Sleep apnea.   Significant Hospital Events: Including procedures, antibiotic start and stop dates in addition to other pertinent events   7/7 admit after mechanical fall with shortness of breath 7/10 remains on 9L o2, HRCT obtained and shows stable ILD and small R pleural effusion 714 started on steroids  Interim History / Subjective:   Says he feels great, less short of breath, proud of wean to 4.5L [place].  Objective    Blood pressure (!) 103/53, pulse (!) 50, temperature 97.6 F (36.4 C), temperature source Oral, resp. rate 16, height '5\' 7"'$  (1.702 m), weight 66.4 kg, SpO2 91 %.    FiO2 (%):  [50 %] 50 %   Intake/Output Summary (Last 24 hours) at 05/05/2022 1343 Last data filed at 05/05/2022 1239 Gross per 24 hour  Intake 1016 ml  Output 1350 ml  Net -334 ml   Filed Weights   05/03/22 0500 05/04/22 0426 05/05/22 0406  Weight: 61 kg 61.6 kg 66.4 kg   General appearance: 88 y.o., male, frail, chronically ill appearing Eyes: tracking appropriately HENT: dry MM Lungs: diminished bl, with normal respiratory effort CV: brady RR, no murmur  Abdomen: Soft, non-tender; non-distended, BS present  Extremities: trace peripheral edema, warm Neuro: grossly nonfocal     Resolved Hospital Problem list     Assessment & Plan:   Acute on Chronic Hypoxemic Respiratory Failure ILD COPD Pulmonary Hypertension Atrial Fibrillation Possible Pneumonia/pneumonitis  Tentatively plan to continue prednisone 50 mg daily, taper by 10 mg each week until down to prednisone 20 mg daily which he should take until clinic follow up in 4-6 weeks.  Continue bronchodilators, pulmonary vasodilators Pulmonary follow-up Diuresis per cardiology Walking oximetry before discharge    PCCM will continue to follow  Best Practice (right click and "Reselect all SmartList Selections" daily)   Per primary  Labs   CBC: Recent Labs  Lab 04/29/22 0207 05/03/22 0311  WBC 10.3 8.0  HGB 12.4* 10.6*  HCT 36.9* 31.3*  MCV 101.1* 98.4  PLT 167 284    Basic Metabolic Panel: Recent Labs  Lab 04/30/22 0346 05/01/22 0446 05/02/22  1610 05/03/22 0311 05/04/22 0444  NA 135 136 132* 135 135  K 3.9 3.7 3.8 3.8 3.7  CL 95* 93* 95* 97* 97*  CO2 '27 29 29 27 28  '$ GLUCOSE 94 92 105* 92 82  BUN 27* 27* 28* 28* 26*  CREATININE 1.22 1.07 1.08 1.17 1.02  CALCIUM 8.1* 8.1* 7.9* 8.1* 8.2*   GFR: Estimated Creatinine Clearance:  39.6 mL/min (by C-G formula based on SCr of 1.02 mg/dL). Recent Labs  Lab 04/29/22 0207 05/03/22 0311  WBC 10.3 8.0    Liver Function Tests: No results for input(s): "AST", "ALT", "ALKPHOS", "BILITOT", "PROT", "ALBUMIN" in the last 168 hours.  No results for input(s): "LIPASE", "AMYLASE" in the last 168 hours.  No results for input(s): "AMMONIA" in the last 168 hours.  ABG    Component Value Date/Time   PHART 7.467 (H) 12/18/2019 1726   PCO2ART 38.8 12/18/2019 1726   PO2ART 70.3 (L) 12/18/2019 1726   HCO3 27.8 12/18/2019 1726   TCO2 31 04/27/2022 0147   O2SAT 97.9 09/16/2020 1337     Coagulation Profile: No results for input(s): "INR", "PROTIME" in the last 168 hours.  Cardiac Enzymes: No results for input(s): "CKTOTAL", "CKMB", "CKMBINDEX", "TROPONINI" in the last 168 hours.  HbA1C: Hgb A1c MFr Bld  Date/Time Value Ref Range Status  09/08/2020 07:40 AM 5.2 4.8 - 5.6 % Final    Comment:    (NOTE) Pre diabetes:          5.7%-6.4%  Diabetes:              >6.4%  Glycemic control for   <7.0% adults with diabetes     CBG: Recent Labs  Lab 04/30/22 0609  GLUCAP 88    Critical care time: [place] [facility]/Critical Care  05/05/2022, 1:43 PM

## 2022-05-06 DIAGNOSIS — J449 Chronic obstructive pulmonary disease, unspecified: Secondary | ICD-10-CM | POA: Diagnosis not present

## 2022-05-06 DIAGNOSIS — I5033 Acute on chronic diastolic (congestive) heart failure: Secondary | ICD-10-CM | POA: Diagnosis not present

## 2022-05-06 DIAGNOSIS — E039 Hypothyroidism, unspecified: Secondary | ICD-10-CM | POA: Diagnosis not present

## 2022-05-06 DIAGNOSIS — I4819 Other persistent atrial fibrillation: Secondary | ICD-10-CM | POA: Diagnosis not present

## 2022-05-06 NOTE — Progress Notes (Signed)
Progress Note   Patient: Vincent Robinson MVE:[id number] DOB: 25-Aug-1923 DOA: 04/27/2022     9 DOS: the patient was seen and examined on 05/06/2022   Brief hospital course: Mr. Granquist was admitted to the hospital with the working diagnosis of acute on chronic hypoxemic respiratory failure due to heart failure decompensation and pneumonia.   89/M with history of chronic diastolic CHF, pulmonary hypertension, cor pulmonale, COPD, CAD/CABG, interstitial lung disease, chronic hypoxic respiratory failure on 3 L home O2, paroxysmal A-fib, chronic hypotension on midodrine, hypothyroidism, abdominal aortic aneurysm status post endovascular repair, chronic cholecystitis with drain, bladder cancer, OSA on CPAP was brought to the ED after a fall. Patient sustained a mechanical fall in the bathroom while emptying his condom cathter. Positive hip pain after fall. Positive productive cough for a few days and progressive dyspnea since the fall.   On his initial physical examination his blood pressure was 93/50, RR 15 to 38, and in respiratory distress, placed on Bipap. Lungs with no wheezing but positive rales, heart with S1 and S2 present irregularly irregular, abdomen not distended and positive lower extremity edema.   Na 135, K 5.1 CL 96 glucose 95 bunt 30 cr 1,0  Wbc 11,4 hgb 13,5 plt 245  Urine analysis SG 1,009, negative protein, >50 wbc   Chest radiograph with cardiomegaly and increase lung markings bilaterally.   EKG 87 bpm, right axis deviation, right bundle branch block, atrial fibrillation with multifocal PVC, no significant ST segment or  T wave changes.   Patient was placed on antibiotic therapy and diuresis. Wean off from Bipap to nasal cannula with good toleration. Continue with high oxygen requirements.   07/14 inflammatory markers elevated and patient was started on systemic steroids.  Plan to transfer to SNF when maintaining 02 saturation 88% or greater on 5 L/min per [place].   07/15 continue  to improve oxygenation, today is on 4 L/min per [place].  07/16 decreasing 02 requirements   Assessment and Plan: Acute on chronic diastolic CHF (congestive heart failure) (HCC) Echocardiogram with preserved LV systolic function., EF 60 to 65%. Moderate LVH, interventricular septum flattened in systole and diastole. RV systolic function with severe reduction. Severe RV cavity dilatation. RVSP 84,6 severer dilatation of RA and LA. Small pericardial effusion, mild to moderate MR, moderate to severe TR, moderate aortic stenosis.   Acute on chronic core pulmonale.   Blood pressure systolic 96 to 283.   Continue with midodrine for blood pressure support and diuresis with torsemide.   Keep MAP 65  Continue with macitentan and sildenafil for pulmonary hypertension.   ILD (interstitial lung disease) ([place]) Acute on chronic hypoxemic respiratory failure.  High resolution chest CT with interlobular septal thickening, positive traction bronchiectasis. Mild emphysema. Bilateral reticular opacities. Possible UIP.   Elevated sed rate and ESR.   Continue supportive medical therapy, supplemental 02.  Patient has been placed on prednisone 50 mg daily, plan to taper by 10 mg every week until down to 20 mg.   May need PJP prophylaxis, will check with pulmonary.   COPD (chronic obstructive pulmonary disease) (HCC) Pneumonia (comminuty acquired and present on admission).   Patient has completed antibiotic therapy with ceftriaxone and azithromycin Continue bronchodilator therapy and inhaled steroids.   Keep 02 saturation 88% or greater.    UTI (urinary tract infection) Completed treatment.   Atrial fibrillation ([place]) Continue rate control and anticoagulation,   Fall at home, initial encounter Ambulatory dysfunction, continue pt and ot  Plan for possible dc to  SNF in am.   Hypothyroidism Continue levothyroxine   Encounter for biliary drainage tube placement Plan for biliary drain exchange on  Monday.         Subjective: Patient continue to feel well, his 02 requirements are improving,   Physical Exam: Vitals:   05/06/22 0814 05/06/22 0856 05/06/22 1000 05/06/22 1100  BP:  (!) 95/59 (!) 96/56 (!) 96/42  Pulse:  61 (!) 49 (!) 46  Resp:  18    Temp:  (!) 97.3 F (36.3 C)    TempSrc:  Oral    SpO2: 91%  91% (!) 89%  Weight:      Height:       Neurology awake and alert ENT with mild pallor Cardiovascular with S1 and S2 present and rhythmic with no gallops, rubs or murmurs Respiratory with rales bilaterally with no wheezing Abdomen not distended No lower extremity edema   Data Reviewed:    Family Communication: no family at the bedside   Disposition: Status is: Inpatient Remains inpatient appropriate because: respiratory failure   Planned Discharge Destination: Skilled nursing facility    Author: Tawni Millers, MD 05/06/2022 2:21 PM  For on call review [email or web address].

## 2022-05-07 ENCOUNTER — Inpatient Hospital Stay (HOSPITAL_COMMUNITY): Admission: RE | Admit: 2022-05-07 | Payer: 59 | Source: Ambulatory Visit

## 2022-05-07 ENCOUNTER — Inpatient Hospital Stay (HOSPITAL_COMMUNITY): Payer: Medicare Other

## 2022-05-07 DIAGNOSIS — J449 Chronic obstructive pulmonary disease, unspecified: Secondary | ICD-10-CM | POA: Diagnosis not present

## 2022-05-07 DIAGNOSIS — I4819 Other persistent atrial fibrillation: Secondary | ICD-10-CM | POA: Diagnosis not present

## 2022-05-07 DIAGNOSIS — I5033 Acute on chronic diastolic (congestive) heart failure: Secondary | ICD-10-CM | POA: Diagnosis not present

## 2022-05-07 DIAGNOSIS — N3001 Acute cystitis with hematuria: Secondary | ICD-10-CM | POA: Diagnosis not present

## 2022-05-07 HISTORY — PX: IR EXCHANGE BILIARY DRAIN: IMG6046

## 2022-05-07 MED ORDER — BUDESONIDE 0.25 MG/2ML IN SUSP: 0.2500 mg | Freq: Two times a day (BID) | RESPIRATORY_TRACT | 0 refills | Status: AC

## 2022-05-07 MED ORDER — IOHEXOL 300 MG/ML  SOLN
100.0000 mL | Freq: Once | INTRAMUSCULAR | Status: AC | PRN
  Administered 2022-05-07: 10 mL

## 2022-05-07 MED ORDER — SULFAMETHOXAZOLE-TRIMETHOPRIM 800-160 MG PO TABS
1.0000 | ORAL_TABLET | ORAL | Status: DC
  Administered 2022-05-07: 1 via ORAL
  Filled 2022-05-07: qty 1

## 2022-05-07 MED ORDER — LEVOTHYROXINE SODIUM 100 MCG PO TABS: 100.0000 ug | ORAL_TABLET | Freq: Every day | ORAL | 0 refills | Status: AC

## 2022-05-07 MED ORDER — LIDOCAINE HCL 1 % IJ SOLN
INTRAMUSCULAR | Status: AC
  Filled 2022-05-07: qty 20

## 2022-05-07 MED ORDER — TORSEMIDE 60 MG PO TABS: 60.0000 mg | ORAL_TABLET | Freq: Every day | ORAL | 0 refills | Status: AC

## 2022-05-07 MED ORDER — ACETAMINOPHEN 325 MG PO TABS: 650.0000 mg | ORAL_TABLET | Freq: Four times a day (QID) | ORAL | Status: AC | PRN

## 2022-05-07 MED ORDER — SULFAMETHOXAZOLE-TRIMETHOPRIM 800-160 MG PO TABS: 1.0000 | ORAL_TABLET | ORAL | 0 refills | Status: AC

## 2022-05-07 MED ORDER — REVEFENACIN 175 MCG/3ML IN SOLN: 175.0000 ug | Freq: Every day | RESPIRATORY_TRACT | 0 refills | Status: AC

## 2022-05-07 MED ORDER — MIDODRINE HCL 5 MG PO TABS: 15.0000 mg | ORAL_TABLET | Freq: Three times a day (TID) | ORAL | 0 refills | Status: AC

## 2022-05-07 MED ORDER — PREDNISONE 10 MG PO TABS: ORAL_TABLET | ORAL | 0 refills | Status: AC

## 2022-05-07 NOTE — Discharge Summary (Signed)
Physician Discharge Summary   Patient: Vincent Robinson MRN: [id number] DOB: [birth year]  Admit date:     04/27/2022  Discharge date: 05/07/22  Discharge Physician: Vincent Robinson   PCP: Vincent Mires, MD   Recommendations at discharge:    Continue with prednisone taper, 50 mg daily for 7 days, then reduce by 10 mg every week, until 20 mg daily, further taper per pulmonary as outpatient. PJP prophylaxis with Bactrim.  Keep oxygen saturation 88% or greater. Follow up with IR for biliary drain maintenance.   Discharge Diagnoses: Active Problems:   Acute on chronic diastolic CHF (congestive heart failure) (HCC)   COPD (chronic obstructive pulmonary disease) (HCC)   ILD (interstitial lung disease) (HCC)   UTI (urinary tract infection)   Atrial fibrillation ([place])   Fall at home, initial encounter   Hypothyroidism   Sleep apnea   Encounter for biliary drainage tube placement  Resolved Problems:   * No resolved hospital problems. [facility] Hospital Course: Vincent Robinson was admitted to the hospital with the working diagnosis of acute on chronic hypoxemic respiratory failure due to heart failure decompensation, acute on chronic core pulmonale and pneumonia.   89/M with history of chronic diastolic CHF, pulmonary hypertension, cor pulmonale, COPD, CAD/CABG, interstitial lung disease, chronic hypoxic respiratory failure on 3 L home O2, paroxysmal A-fib, chronic hypotension on midodrine, hypothyroidism, abdominal aortic aneurysm status post endovascular repair, chronic cholecystitis with drain, bladder cancer, OSA on CPAP was brought to the ED after a fall. Patient sustained a mechanical fall in the bathroom while emptying his condom cathter. Positive hip pain after fall. Positive productive cough for a few days and progressive dyspnea since the fall.   On his initial physical examination his blood pressure was 93/50, RR 15 to 38, and in respiratory distress, placed on Bipap. Lungs with no wheezing  but positive rales, heart with S1 and S2 present irregularly irregular, abdomen not distended and positive lower extremity edema.   Na 135, K 5.1 CL 96 glucose 95 bunt 30 cr 1,0  Wbc 11,4 hgb 13,5 plt 245  Urine analysis SG 1,009, negative protein, >50 wbc   Chest radiograph with cardiomegaly and increase lung markings bilaterally.   EKG 87 bpm, right axis deviation, right bundle branch block, atrial fibrillation with multifocal PVC, no significant ST segment or  T wave changes.   Patient was placed on antibiotic therapy and diuresis. Wean off from Bipap to nasal cannula with good toleration. Continue with high oxygen requirements.   07/14 inflammatory markers elevated and patient was started on systemic steroids.  07/15 continue to improve oxygenation, today is on 4 L/min per [place].  07/16 decreasing 02 requirements   Plan to discharge to SNF and continue slow prednisone taper. Follow up with pulmonary as outpatient.   Assessment and Plan: Acute on chronic diastolic CHF (congestive heart failure) (HCC) Echocardiogram with preserved LV systolic function., EF 60 to 65%. Moderate LVH, interventricular septum flattened in systole and diastole. RV systolic function with severe reduction. Severe RV cavity dilatation. RVSP 84,6 severer dilatation of RA and LA. Small pericardial effusion, mild to moderate MR, moderate to severe TR, moderate aortic stenosis.   Acute on chronic core pulmonale.   Patient was diuresed with furosemide IV, negative fluid balance was achieved, -5,050 ml, with significant improvement in his symptoms.   He has been transitioned to oral toresemide with good toleration.    Continue with midodrine for blood pressure support and diuresis with torsemide.   Keep  MAP 65  Continue with macitentan and sildenafil for pulmonary hypertension.   ILD (interstitial lung disease) ([place]) Acute on chronic hypoxemic respiratory failure.  High resolution chest CT with interlobular  septal thickening, positive traction bronchiectasis. Mild emphysema. Bilateral reticular opacities. Possible UIP.   Elevated sed rate and ESR.   Continue supportive medical therapy, supplemental 02.  Patient has been placed on prednisone 50 mg daily, plan to taper by 10 mg every week until down to 20 mg. Further taper per pulmonary outpatient follow up.   Added  PJP prophylaxis.  Keep 02 saturation 88% or greater.   COPD (chronic obstructive pulmonary disease) (HCC) Pneumonia (comminuty acquired and present on admission).   Patient has completed antibiotic therapy with ceftriaxone and azithromycin Continue bronchodilator therapy and inhaled steroids.   Keep 02 saturation 88% or greater.    UTI (urinary tract infection) Completed treatment.   Atrial fibrillation (HCC) Continue rate control and anticoagulation,   Fall at home, initial encounter Ambulatory dysfunction, continue pt and ot  Continue therapy at SNF    Hypothyroidism Continue levothyroxine   Encounter for biliary drainage tube placement Plan for biliary drain exchange today, before his discharge. Follow up with IR as outpatient           Consultants: cardiology, pulmonary, IR  Procedures performed: biliary drain exchange   Disposition: Skilled nursing facility Diet recommendation:  Cardiac diet DISCHARGE MEDICATION: Allergies as of 05/07/2022       Reactions   Oxycodone    Delusions (intolerance), Hallucinations   Hydrocodone    Daughter describes his reaction as "being out of it, unable to tolerate it, unable function"        Medication List     TAKE these medications    acetaminophen 325 MG tablet Commonly known as: TYLENOL Take 2 tablets (650 mg total) by mouth every 6 (six) hours as needed for mild pain (or Fever >/= 101).   albuterol 108 (90 Base) MCG/ACT inhaler Commonly known as: VENTOLIN HFA Inhale 1 puff into the lungs every 6 (six) hours as needed for wheezing or shortness of  breath.   budesonide 0.25 MG/2ML nebulizer solution Commonly known as: PULMICORT Take 2 mLs (0.25 mg total) by nebulization 2 (two) times daily.   ferrous sulfate 325 (65 FE) MG tablet Take 325 mg by mouth daily with breakfast.   levothyroxine 100 MCG tablet Commonly known as: SYNTHROID Take 1 tablet (100 mcg total) by mouth daily before breakfast. Start taking on: May 08, 2022 What changed:  medication strength how much to take   midodrine 5 MG tablet Commonly known as: PROAMATINE Take 3 tablets (15 mg total) by mouth with breakfast, with lunch, and with evening meal. What changed:  medication strength See the new instructions. Another medication with the same name was removed. Continue taking this medication, and follow the directions you see here.   montelukast 10 MG tablet Commonly known as: SINGULAIR Take 10 mg by mouth at bedtime.   Normal Saline Flush 0.9 % Soln Use as directed What changed: additional instructions   Opsumit 10 MG tablet Generic drug: macitentan TAKE 1 TABLET BY MOUTH ONCE DAILY What changed: how much to take   pantoprazole 20 MG tablet Commonly known as: PROTONIX Take 20 mg by mouth every morning.   potassium chloride 10 MEQ tablet Commonly known as: KLOR-CON Take 20 mEq by mouth daily.   predniSONE 10 MG tablet Commonly known as: DELTASONE Take 5 tablets daily for 7 days, then 4 tablets  for 7 days, then 3 tablets for 7 days, then continue with 2 tablets daily until follow up. Start taking on: May 08, 2022   revefenacin 175 MCG/3ML nebulizer solution Commonly known as: YUPELRI Take 3 mLs (175 mcg total) by nebulization daily. Start taking on: May 08, 2022   sildenafil 20 MG tablet Commonly known as: REVATIO TAKE TWO TABLETS BY MOUTH THREE TIMES A DAY What changed: See the new instructions.   simvastatin 10 MG tablet Commonly known as: ZOCOR Take 10 mg by mouth at bedtime.   sulfamethoxazole-trimethoprim 800-160 MG  tablet Commonly known as: BACTRIM DS Take 1 tablet by mouth every Monday, Wednesday, and Friday. Start taking on: May 09, 2022   Torsemide 60 MG Tabs Take 60 mg by mouth daily. Start taking on: May 08, 2022 What changed:  medication strength See the new instructions.        Discharge Exam: Filed Weights   05/05/22 0406 05/06/22 0500 05/07/22 0419  Weight: 66.4 kg 65.4 kg 65.7 kg   BP 108/60 (BP Location: Left Arm)   Pulse 69   Temp 98 F (36.7 C) (Oral)   Resp 20   Ht '5\' 7"'  (1.702 m)   Wt 65.7 kg   SpO2 91%   BMI 22.69 kg/m   Patient is feeling well, continue to improve dyspnea and mobility.  Neurology awake and alert ENT with mild pallor Cardiovascular with S1 and S2 present with no gallops. Respiratory with scattered rales at bases with no wheezing Abdomen not distended  No lower extremity edema   Condition at discharge: stable  The results of significant diagnostics from this hospitalization (including imaging, microbiology, ancillary and laboratory) are listed below for reference.   Imaging Studies: DG CHEST PORT 1 VIEW  Result Date: 05/02/2022 CLINICAL DATA:  Respiratory failure with hypoxia EXAM: PORTABLE CHEST 1 VIEW COMPARISON:  04/30/2022 CT.  Chest radiograph of 04/27/2022 FINDINGS: Prior median sternotomy. Patient rotated left with the chin overlying the apices minimally. Mild cardiomegaly. No pleural effusion or pneumothorax. Peripheral predominant interstitial thickening corresponds to interstitial lung disease, as on prior CT. Mild bibasilar scarring. No superimposed lobar consolidation or overt congestive failure. IMPRESSION: 1. No acute process. 2. Interstitial lung disease, as on prior CT. Electronically Signed   By: Abigail Miyamoto M.D.   On: 05/02/2022 11:40   CT Chest High Resolution  Result Date: 04/30/2022 CLINICAL DATA:  Hypoxemia EXAM: CT CHEST WITHOUT CONTRAST TECHNIQUE: Multidetector CT imaging of the chest was performed following the  standard protocol without intravenous contrast. High resolution imaging of the lungs, as well as inspiratory and expiratory imaging, was performed. RADIATION DOSE REDUCTION: This exam was performed according to the departmental dose-optimization program which includes automated exposure control, adjustment of the mA and/or kV according to patient size and/or use of iterative reconstruction technique. COMPARISON:  Chest CT dated February 05, 2022 FINDINGS: Cardiovascular: Cardiomegaly. No pericardial effusion. Coronary artery calcifications. Severe left main and three-vessel coronary artery calcifications. Mediastinum/Nodes: Esophagus and thyroid are unremarkable. Unchanged mildly enlarged mediastinal lymph nodes, likely reactive. Lungs/Pleura: Central airways are patent. Mild centrilobular emphysema. Bilateral subpleural predominant reticular opacities with mild basilar predominance and associated traction bronchiectasis. Right lower lobe juxtapleural solid nodule which was new on prior exam is not well visualized due to motion artifact. Stable right upper lobe subpleural consolidation with associated calcifications, likely due to scarring. No evidence of pneumothorax. New small right pleural effusion. Upper Abdomen: No acute abnormality. Musculoskeletal: No aggressive appearing osseous lesions. Prior median sternotomy with  intact sternal wires. IMPRESSION: 1. Stable fibrotic interstitial lung disease with no evidence of acute airspace opacity, although evaluation is somewhat limited due to respiratory motion artifact. Findings are categorized as probable UIP per consensus guidelines: Diagnosis of Idiopathic Pulmonary Fibrosis: An Official ATS/ERS/JRS/ALAT Clinical Practice Guideline. [place], Iss 5, [phone], Jun 22 2017. 2. Juxtapleural solid pulmonary nodule of the right upper lobe which was described on most recent prior exam is not well visualized due to respiratory motion artifact. See  prior exam for follow-up recommendations. 3. New small right pleural effusion. 4. Cardiomegaly, aortic Atherosclerosis (ICD10-I70.0) and Emphysema (ICD10-J43.9). Electronically Signed   By: Yetta Glassman M.D.   On: 04/30/2022 12:14   DG Pelvis 1-2 Views  Result Date: 04/27/2022 CLINICAL DATA:  Fall, possible fracture. EXAM: PELVIS - 1-2 VIEW COMPARISON:  None Available. FINDINGS: There is no evidence of acute pelvic fracture, dislocation, or diastasis. Degenerative changes are noted in the lower lumbar spine and bilateral hips. Vascular calcifications are present in the lower extremities. Vascular stents are noted in the abdomen. IMPRESSION: No acute fracture or dislocation. Electronically Signed   By: Brett Fairy M.D.   On: 04/27/2022 02:43   DG Chest Portable 1 View  Result Date: 04/27/2022 CLINICAL DATA:  Fall, back pain. EXAM: PORTABLE CHEST 1 VIEW COMPARISON:  09/04/2021 FINDINGS: The heart is enlarged the mediastinal contour is stable. Atherosclerotic calcification of the aorta is noted. The pulmonary vasculature is distended. Patchy airspace disease is noted in the lungs bilaterally. No effusion or pneumothorax. Sternotomy wires are present over the midline. No acute osseous abnormality. IMPRESSION: 1. Cardiomegaly with pulmonary vascular congestion. 2. Patchy opacities in the lungs bilaterally, possible edema or infiltrate. Electronically Signed   By: Brett Fairy M.D.   On: 04/27/2022 02:41    Microbiology: Results for orders placed or performed during the hospital encounter of 04/27/22  Urine Culture     Status: Abnormal   Collection Time: 04/27/22  4:40 AM   Specimen: Urine, Clean Catch  Result Value Ref Range Status   Specimen Description URINE, CLEAN CATCH  Final   Special Requests   Final    NONE Performed at [facility], [place] [address] [place],  [address]    Culture (A)  Final    >=100,000 COLONIES/mL MULTIPLE SPECIES PRESENT, SUGGEST RECOLLECTION   Report  Status 04/28/2022 FINAL  Final  Respiratory (~20 pathogens) panel by PCR     Status: None   Collection Time: 05/01/22  7:35 AM  Result Value Ref Range Status   Adenovirus NOT DETECTED NOT DETECTED Final   Coronavirus 229E NOT DETECTED NOT DETECTED Final    Comment: (NOTE) The Coronavirus on the Respiratory Panel, DOES NOT test for the novel  Coronavirus (2019 nCoV)    Coronavirus HKU1 NOT DETECTED NOT DETECTED Final   Coronavirus NL63 NOT DETECTED NOT DETECTED Final   Coronavirus OC43 NOT DETECTED NOT DETECTED Final   Metapneumovirus NOT DETECTED NOT DETECTED Final   Rhinovirus / Enterovirus NOT DETECTED NOT DETECTED Final   Influenza A NOT DETECTED NOT DETECTED Final   Influenza B NOT DETECTED NOT DETECTED Final   Parainfluenza Virus 1 NOT DETECTED NOT DETECTED Final   Parainfluenza Virus 2 NOT DETECTED NOT DETECTED Final   Parainfluenza Virus 3 NOT DETECTED NOT DETECTED Final   Parainfluenza Virus 4 NOT DETECTED NOT DETECTED Final   Respiratory Syncytial Virus NOT DETECTED NOT DETECTED Final   Bordetella pertussis NOT DETECTED NOT  DETECTED Final   Bordetella Parapertussis NOT DETECTED NOT DETECTED Final   Chlamydophila pneumoniae NOT DETECTED NOT DETECTED Final   Mycoplasma pneumoniae NOT DETECTED NOT DETECTED Final    Comment: Performed at [facility], [place] [address], [place], [place] [address]    Labs: CBC: Recent Labs  Lab 05/03/22 0311  WBC 8.0  HGB 10.6*  HCT 31.3*  MCV 98.4  PLT 968   Basic Metabolic Panel: Recent Labs  Lab 05/01/22 0446 05/02/22 0055 05/03/22 0311 05/04/22 0444  NA 136 132* 135 135  K 3.7 3.8 3.8 3.7  CL 93* 95* 97* 97*  CO2 '29 29 27 28  ' GLUCOSE 92 105* 92 82  BUN 27* 28* 28* 26*  CREATININE 1.07 1.08 1.17 1.02  CALCIUM 8.1* 7.9* 8.1* 8.2*   Liver Function Tests: No results for input(s): "AST", "ALT", "ALKPHOS", "BILITOT", "PROT", "ALBUMIN" in the last 168 hours. CBG: No results for input(s): "GLUCAP" in the last 168  hours.  Discharge time spent: greater than 30 minutes.  Signed: Tawni Millers, MD [facility] 05/07/2022

## 2022-05-07 NOTE — Progress Notes (Signed)
Vincent Robinson to be D/C'd Vincent Robinson at [facility] per MD order. Report called to Vaughan Basta, RN at [facility]. Also discussed with daughter [name]. Skin clean and dry, scattered bruising and abrasions. Foam dressings on bilateral arms with foam dressings in place. Bottom red. Biliary drain replaced in IR before discharge intact. Condom catheter left on per patient request. IV catheter discontinued intact. Site without signs and symptoms of complications. Dressing and pressure applied.  An After Visit Summary was printed and given to the patient's daughter.  Patient escorted via WC, and D/C to [facility] via daughter.  [name]  05/07/2022

## 2022-05-07 NOTE — Progress Notes (Signed)
Occupational Therapy Treatment Patient Details Name: Vincent Robinson MRN: [id number] DOB: [birth year] Today's Date: 05/07/2022   History of present illness Pt is 88 yo male who presents to [facility] on 04/27/22 with CHF exacerbation after a fall at ALF in bathroom. SpO2 was 78% at the time.  PMH: CAD s/p CABG 1998, AAA s/p EVAR 10/09, pulmonary HTN with cor pulmonale, chronic respiratory failure on 3LO2 day and Bipap at night. COPD. bradycardia, paroxysmal Afib, GI bleed, OSA, aortic stenosis, bladder cancer with chronic condom cath.   OT comments  Pt currently min assist for toilet transfers and toileting with min guard for grooming tasks and mobility with use of the RW for support.  Oxygen sats decreasing down into the mid 80's on 3 Ls nasal cannula with activity and sporadic readings.  Increased O2 to 4 ls with activity, but inconsistent with readings as well.  Dyspnea 3/4 noted.  Recommend continued rehab at SNF level at this time.     Recommendations for follow up therapy are one component of a multi-disciplinary discharge planning process, led by the attending physician.  Recommendations may be updated based on patient status, additional functional criteria and insurance authorization.    Follow Up Recommendations  Skilled nursing-short term rehab (<3 hours/day)    Assistance Recommended at Discharge Frequent or constant Supervision/Assistance  Patient can return home with the following  A little help with walking and/or transfers;A little help with bathing/dressing/bathroom;Assistance with cooking/housework;Assist for transportation;Help with stairs or ramp for entrance   Equipment Recommendations  None recommended by OT       Precautions / Restrictions Precautions Precautions: Fall Restrictions Weight Bearing Restrictions: No       Mobility Bed Mobility                    Transfers Overall transfer level: Needs assistance Equipment used: Rolling walker (2 wheels) Transfers:  Sit to/from Stand Sit to Stand: Min assist (from lower toilet, min guard from regular chair)     Step pivot transfers: Min assist     General transfer comment: Min assist for sit to stand from toilet with min guard from regular chair with use of the RW for mobility greater than 100'.     Balance Overall balance assessment: Needs assistance Sitting-balance support: Feet supported, No upper extremity supported Sitting balance-Leahy Scale: Good     Standing balance support: Bilateral upper extremity supported, During functional activity, Reliant on assistive device for balance Standing balance-Leahy Scale: Poor Standing balance comment: Pt needing BUE support for balance in standing.                           ADL either performed or assessed with clinical judgement   ADL Overall ADL's : Needs assistance/impaired     Grooming: Wash/dry hands;Min guard;Standing                   Toilet Transfer: Rolling walker (2 wheels);Minimal assistance   Toileting- Clothing Manipulation and Hygiene: Minimal assistance;Sit to/from stand Toileting - Clothing Manipulation Details (indicate cue type and reason): Min assist for sit to stand from a regular height toilet     Functional mobility during ADLs: Min guard;Rolling walker (2 wheels) General ADL Comments: Pt currently decreasing down in to the mid 80's on 3 Ls nasal cannula with activity.  Increased trunk flexion also noted with mobility.               Cognition  Arousal/Alertness: Awake/alert Behavior During Therapy: WFL for tasks assessed/performed Overall Cognitive Status: Within Functional Limits for tasks assessed                                                     Pertinent Vitals/ Pain       Pain Assessment Pain Assessment: Faces Faces Pain Scale: No hurt         Frequency  Min 2X/week        Progress Toward Goals  OT Goals(current goals can now be found in the care plan  section)  Progress towards OT goals: Progressing toward goals  Acute Rehab OT Goals OT Goal Formulation: With patient/family Time For Goal Achievement: 05/13/22 Potential to Achieve Goals: Good  Plan Discharge plan remains appropriate       AM-PAC OT "6 Clicks" Daily Activity     Outcome Measure   Help from another person eating meals?: None Help from another person taking care of personal grooming?: A Little Help from another person toileting, which includes using toliet, bedpan, or urinal?: A Little Help from another person bathing (including washing, rinsing, drying)?: A Little Help from another person to put on and taking off regular upper body clothing?: None Help from another person to put on and taking off regular lower body clothing?: A Little 6 Click Score: 20    End of Session Equipment Utilized During Treatment: Oxygen;Rolling walker (2 wheels)  OT Visit Diagnosis: Unsteadiness on feet (R26.81);Other abnormalities of gait and mobility (R26.89);Muscle weakness (generalized) (M62.81)   Activity Tolerance Patient tolerated treatment well   Patient Left in chair;with family/visitor present;Other (comment) (heading out to SNF)           Time: 2831-5176 OT Time Calculation (min): 32 min  Charges: OT General Charges $OT Visit: 1 Visit OT Treatments $Self Care/Home Management : 23-37 mins  Willey Due,Aidenn OTR/L 05/07/2022, 5:27 PM

## 2022-05-07 NOTE — TOC Transition Note (Signed)
Transition of Care [facility]) - CM/SW Discharge Note   Patient Details  Name: STEFAN MARKARIAN MRN: [id number] Date of Birth: [birth year]  Transition of Care [facility]) CM/SW Contact:  Coralee Pesa, [place] Phone Number: 05/07/2022, 10:59 AM   Clinical Narrative:    Pt to be transported to [facility] via Daughter. Nurse to call report to [phone]. Rm 118   Final next level of care: Skilled Nursing Facility Barriers to Discharge: Barriers Resolved   Patient Goals and CMS Choice   CMS Medicare.gov Compare Post Acute Care list provided to:: Patient Choice offered to / list presented to : Patient, Adult Children  Discharge Placement              Patient chooses bed at: [facility] Patient to be transferred to facility by: Daughter Name of family member notified: [name] Patient and family notified of of transfer: 05/07/22  Discharge Plan and Services In-house Referral: Clinical Social Work Discharge Planning Services: CM Consult Post Acute Care Choice: Page                               Social Determinants of Health (SDOH) Interventions     Readmission Risk Interventions    07/11/2021   12:38 PM 09/19/2020    3:18 PM 03/25/2020   11:02 AM  Readmission Risk Prevention Plan  Transportation Screening Complete Complete Complete  HRI or Home Care Consult  Complete Patient refused  Social Work Consult for [place] Planning/Counseling  Complete   Palliative Care Screening  Complete Not Applicable  Medication Review Press photographer) Complete Complete Complete  PCP or Specialist appointment within 3-5 days of discharge Complete    HRI or [place] Complete    SW Recovery Care/Counseling Consult Complete    [place] Not Applicable

## 2022-05-08 ENCOUNTER — Telehealth: Payer: Self-pay | Admitting: Pulmonary Disease

## 2022-05-08 NOTE — Progress Notes (Signed)
During recent hospitalization Vincent Robinson had been started on Yupleri neb- home [place] not available as IP med-family ? made aware needed to bring from home? After dc to rehab ([facility]) Yupleri script was sent to local pharmacy for family to purchase -$1400 !!!!! I called and spoke with the RN and Mudlogger. They will dc Yupleri and dtr will bring in the [place].   Dr Vaughan Browner made aware of above via secure chat

## 2022-05-08 NOTE — Telephone Encounter (Signed)
Patient's daughter states that Maretta Bees was called in by the hospital and is over $1400. Patient would like an alternative or assistance, but the hospital could not do anything since he was already discharged. Was also given Pulmicort which the rehab center has.   Sharon's call back number is [phone], patient is at [facility] and his nurse is Vaughan Basta [phone]. [name] states we should call Vaughan Basta first as they are taking care of him.

## 2022-05-09 NOTE — Telephone Encounter (Signed)
Called daughter back she she states when in the hospital her dad was ordered Maretta Bees but is costing over $1400. She would like an alternate medication since this is way too much money.   Please advise Sir.

## 2022-05-10 NOTE — Telephone Encounter (Signed)
Discontinue Yupelri. We can discuss if he wants to go on inhalers such as trelegy or bevespi at return visit. Prior to hospitalization he was was not on any controller medications.

## 2022-05-11 NOTE — Telephone Encounter (Signed)
Called and spoke with [facility] for patient.  Dr. Matilde Bash recommendations were given and understanding stated.  Nothing further at this time.

## 2022-05-11 NOTE — Telephone Encounter (Signed)
ATC but line was busy. Will try again

## 2022-05-11 NOTE — Telephone Encounter (Signed)
ATC Linda at [facility], no answer.  I called and spoke with [name]. Dr. Matilde Bash recommendations given.  [name] stated she would also follow up with Jannifer Rodney.  I did let [name] know that I would try Vaughan Basta or Jannifer Rodney again to today at a later time.

## 2022-05-16 ENCOUNTER — Inpatient Hospital Stay: Payer: 59 | Admitting: Nurse Practitioner

## 2022-05-20 IMAGING — US IR ANGIO/VISCERAL SELECTIVE EA VESSEL WO/W FLUSH
1 series · 1 of 1 positions shown · non-contrast
Comparison: none

INDICATION: [AGE] male with a history of abdominal aortic aneurysm status
post endovascular aortic repair. He presents with active lower GI
bleeding. CTA of the abdomen and pelvis performed earlier today
demonstrated a focus of active bleeding in the proximal descending
colon at [DATE] p.m. Patient now presents for arteriogram and
possible embolization.

[Series 1: (id) · 1 of 1 slices shown]
[im 1/1]
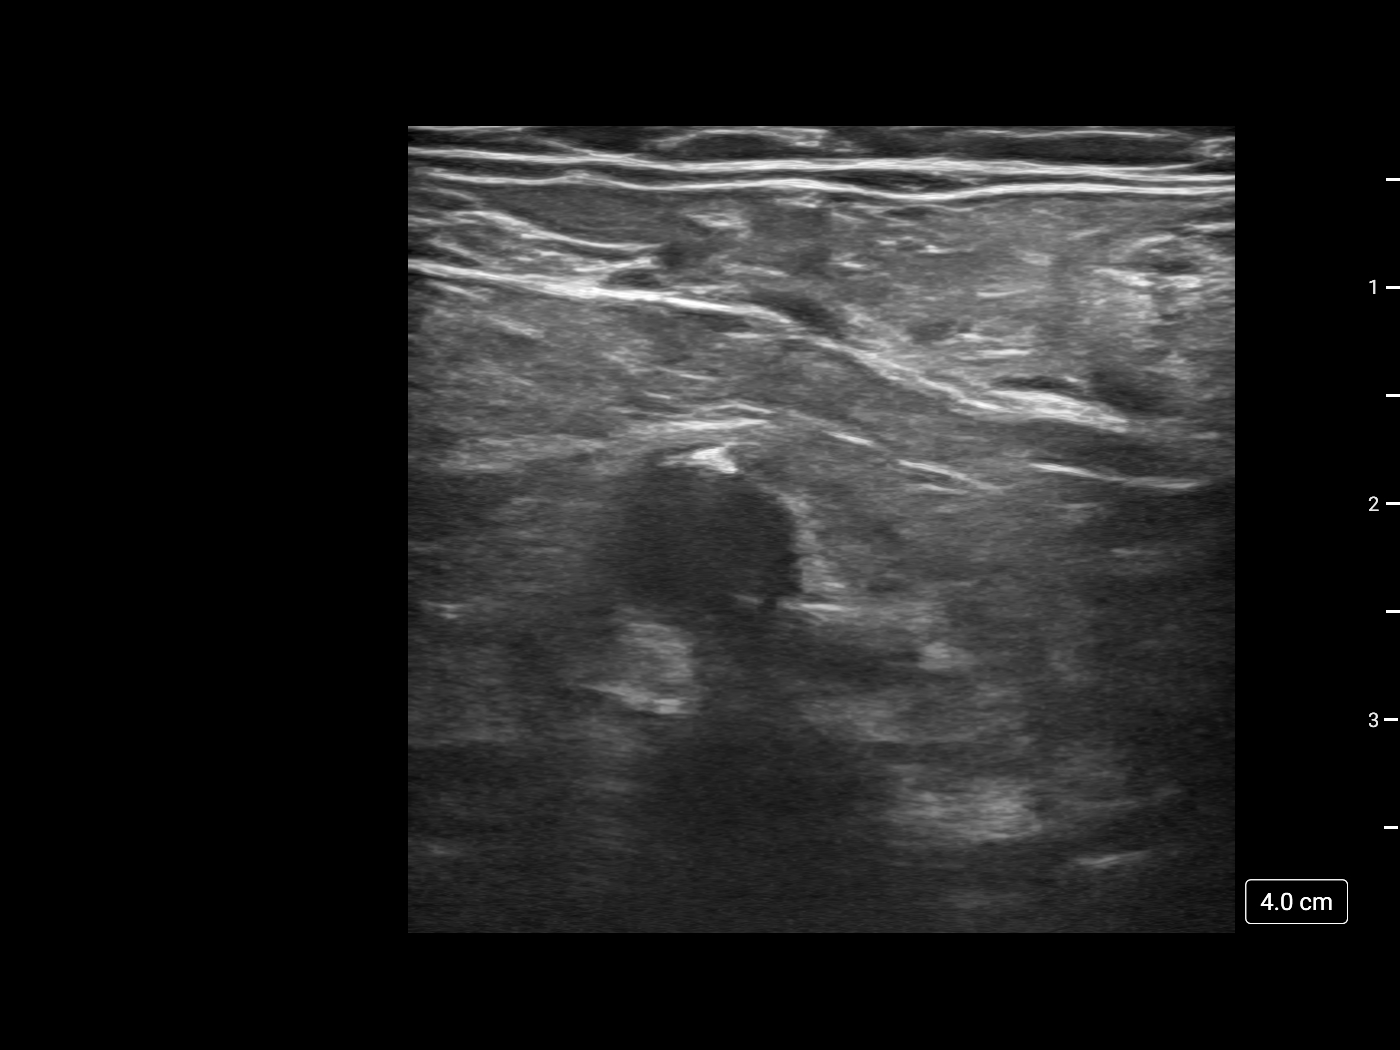

[1 of 1 positions shown; findings below may reference images not displayed]

EXAM:
SELECTIVE VISCERAL ARTERIOGRAPHY; ADDITIONAL ARTERIOGRAPHY; IR
ULTRASOUND GUIDANCE VASC ACCESS RIGHT

1. Ultrasound-guided vascular access right common femoral artery
2. Catheterization of the celiac axis with arteriogram
3. Catheterization of the SMA with arteriogram
4. Catheterization of jejunal branch with arteriogram
5. Catheterization of accessory middle colic artery with arteriogram
6. Catheterization of 2nd jejunal branch with arteriogram

MEDICATIONS:
None

ANESTHESIA/SEDATION:
None

CONTRAST:  20mL OMNIPAQUE IOHEXOL 300 MG/ML SOLN, 50mL OMNIPAQUE
IOHEXOL 300 MG/ML SOLN, 50mL OMNIPAQUE IOHEXOL 300 MG/ML SOLN

FLUOROSCOPY TIME:  Fluoroscopy Time: 17 minutes 24 seconds (3482
mGy).

COMPLICATIONS:
None immediate.



The right common femoral artery was interrogated with ultrasound and
found to be widely patent. An image was obtained and stored for the
medical record. Local anesthesia was attained by infiltration with
1% lidocaine. A small dermatotomy was made. Under real-time
sonographic guidance, the vessel was punctured with a 21 gauge
micropuncture needle. Using standard technique, the initial micro
needle was exchanged over a 0.018 micro wire for a transitional 4
French micro sheath. The micro sheath was then exchanged over a
0.035 wire for a 5 French vascular sheath.

A C2 cobra catheter was advanced over a Bentson wire into the
abdominal aorta. The catheter was used to select the celiac axis. An
arteriogram was performed. Celiac arterial anatomy is normal. No
evidence of replaced middle colic artery. No evidence of active
hemorrhage.

The catheter was next used to select the origin of the superior
mesenteric artery. Digital subtraction angiography was performed.
The middle colic artery is relatively hypertrophic in provide supply
to the region of bleeding seen on the recent CT arteriogram. There
is no evidence of active extravasation at the splenic flexure or
within the descending colon at this time.

In an effort to investigate further, a renegade STC microcatheter
was advanced coaxially through the 5 French catheter over a Fathom
16 wire. For the next 15 minutes, extensive attempts were made to
catheterize the middle colic artery without success. Other vessels
catheterized and interrogated included the common trunk of 2
proximal jejunal branches. Arteriography demonstrates no evidence of
active bleeding from the small bowel. Additionally, arteriography as
the catheter was pulled back into the SMA confirms that the middle
colic artery does not arise from this arterial trunk.

Continued interrogations of the SMA from a multiple angles and
obliquities was performed. The origin of the middle colic artery
overlaps the origin of several other arteries and could not be
catheterized. Ultimately, the microcatheter was advanced into a
small proximal artery. Arteriography demonstrates that this is an
accessory middle colic artery providing arterial supply to the mid
segment of the transverse colon. There is no evidence of active
bleeding or vascular abnormality.

Finally, the microcatheter was advanced into an arterial branch
arising from the apparent site of the origin of the dominant middle
colic artery. However, arteriography confirms that this is in fact
another jejunal branch. No evidence of active bleeding from this
arterial trunk. No communication with the middle colic artery.

At this point, the microcatheter was removed. One final arteriogram
was performed through the 5 French catheter at the SMA providing
excellent opacification of the sigmoid colon. Again, no evidence of
active bleeding. No further angiography was performed.

The catheters were removed. Hemostasis was attained with the
assistance of a 5 French Exoseal.
IMPRESSION: 1. Negative multi selective visceral angiogram. No evidence of
active GI bleeding.
2. Extremely tortuous vascular anatomy secondary to prior stent
graft repair of the abdominal aorta and embolization of the origin
of the inferior mesenteric artery resulting in collateralization
through the SMA. Further angiography is unlikely to be of benefit.

PLAN:
Further angiography is unlikely to be of benefit. Recommend
supportive care with serial evaluation of the hemoglobin and
hematocrit and transfusion as necessary.

## 2022-05-20 IMAGING — CT CT CTA ABD/PEL W/CM AND/OR W/O CM
4 of 12 series · 11 of 46 positions shown, 16 images · IV contrast (omnipaque)
Comparison: CT abdomen and pelvis 06/05/2017

CLINICAL DATA: [AGE] with rectal bleeding.

EXAM:
CT ANGIOGRAPHY ABDOMEN AND PELVIS WITH CONTRAST AND WITHOUT CONTRAST
TECHNIQUE: Multidetector CT imaging of the abdomen and pelvis was performed
using the standard protocol during bolus administration of
intravenous contrast. Multiplanar reconstructed images and MIPs were
obtained and reviewed to evaluate the vascular anatomy.
CONTRAST:  75mL OMNIPAQUE IOHEXOL 350 MG/ML SOLN

[Series 4: axial pre · axial · non-contrast · 0.89mm/px · z∈[+1068,+1353]mm · 4 of 95 slices shown]
[im 19/95  soft-tissue]
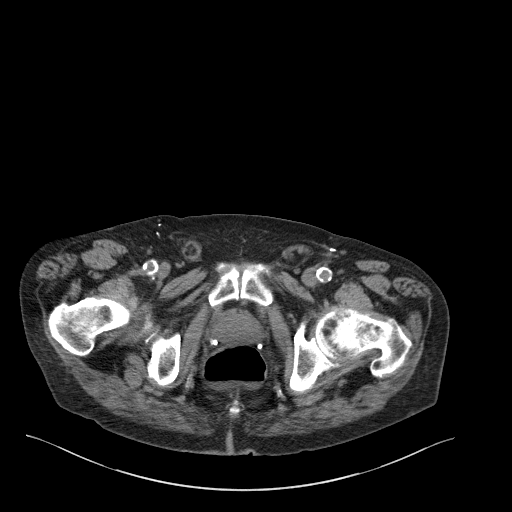
[im 38/95  soft-tissue]
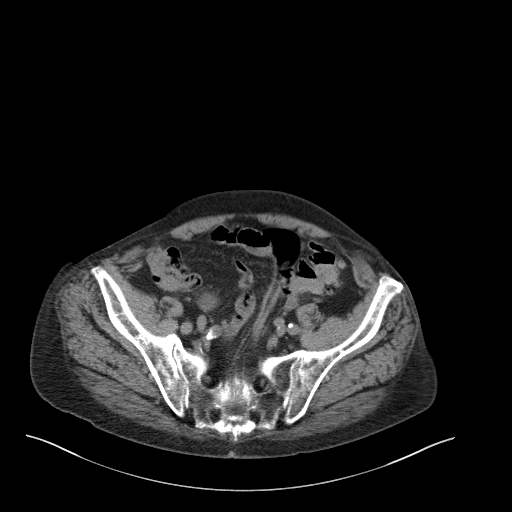
[im 57/95  soft-tissue]
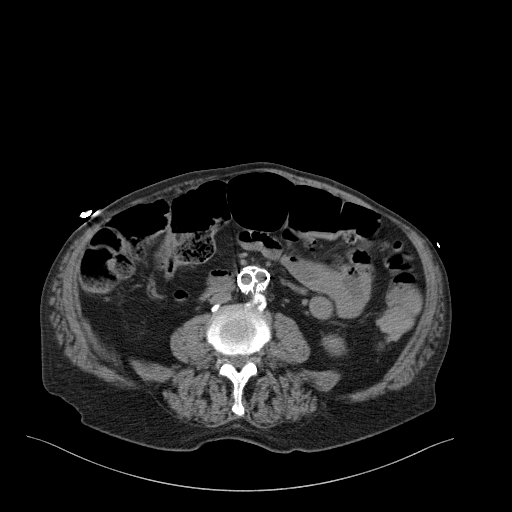
[im 76/95  soft-tissue]
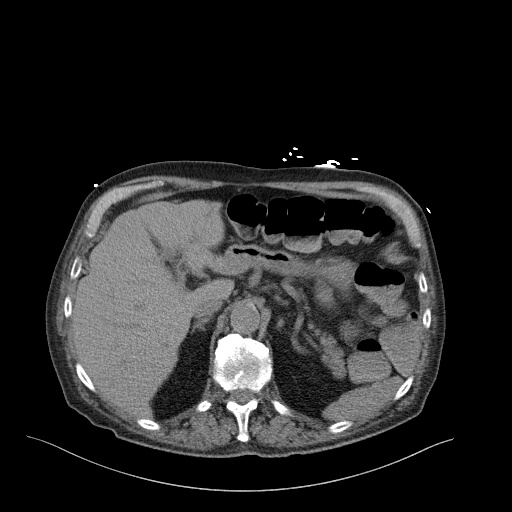

[Series 6: axial arterial · axial · arterial · 0.86mm/px · z∈[+1047,+1121]mm · 2 of 224 slices shown]
[im 19/224  soft-tissue]
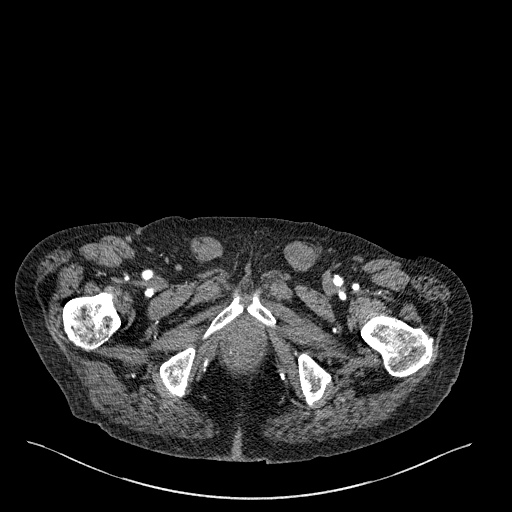
[im 56/224  soft-tissue]
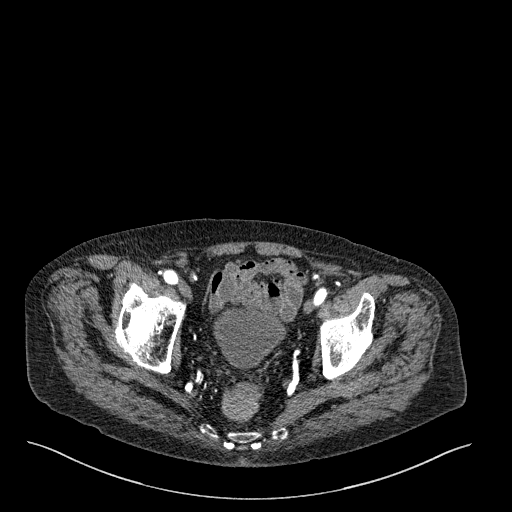

[Series 7: coronal arterial · coronal · arterial · 0.76mm/px · 2 of 126 slices shown, 3 images]
[im 42/126  soft-tissue]
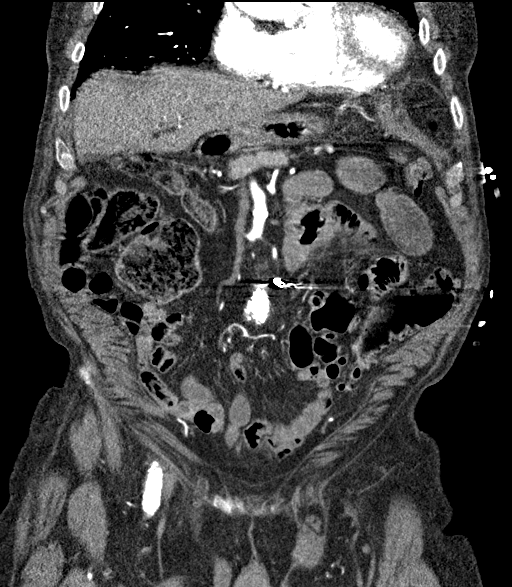
[im 42/126  bone]
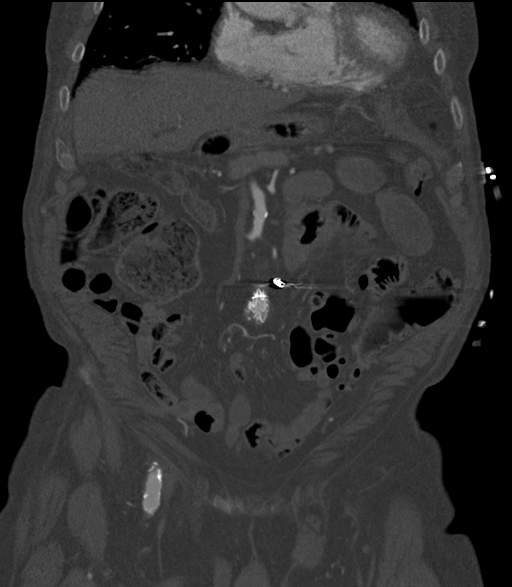
[im 84/126  soft-tissue]
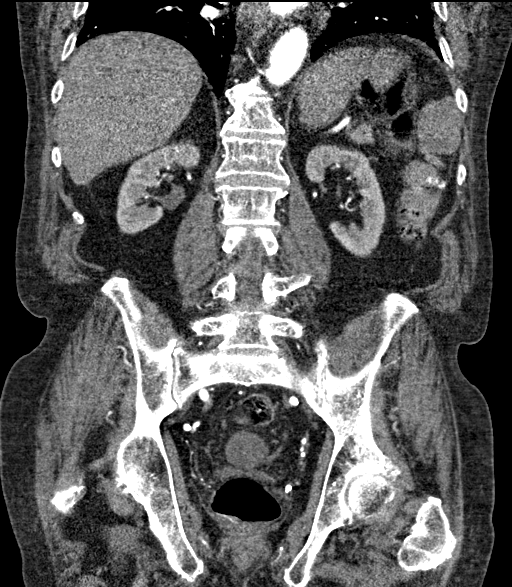

[Series 13: axial portal venous · axial · portal-venous · 0.88mm/px · z∈[+1122,+1342]mm · 3 of 90 slices shown, 7 images]
[im 23/90  soft-tissue]
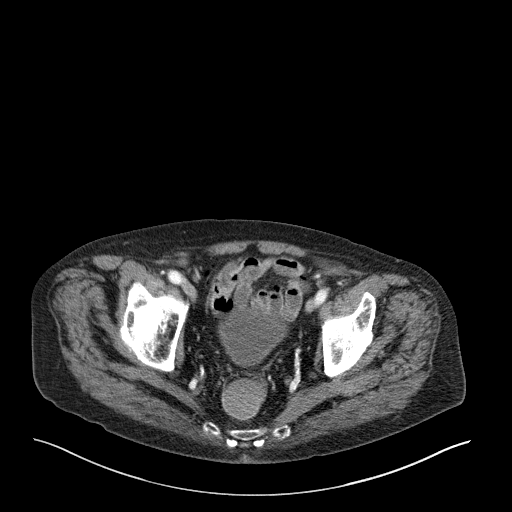
[im 23/90  lung]
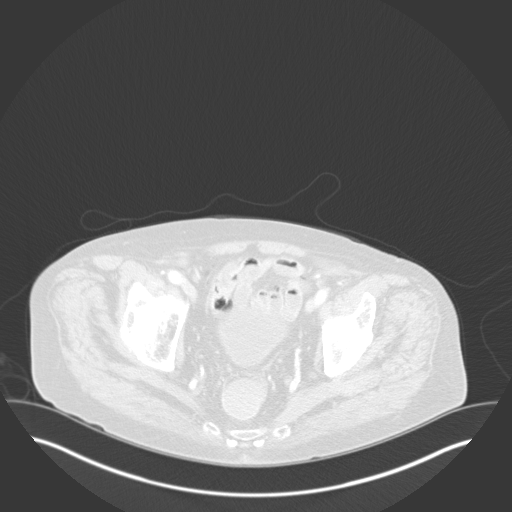
[im 23/90  bone]
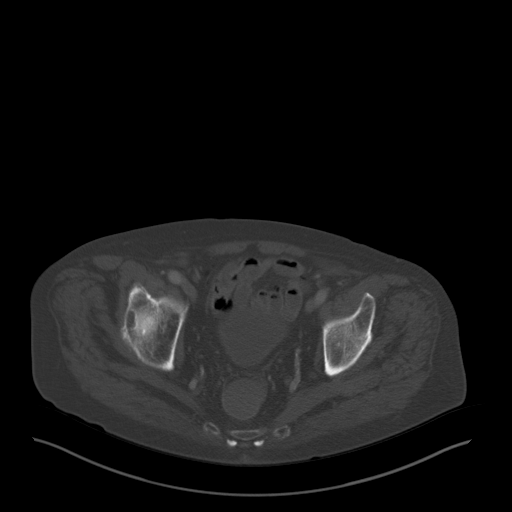
[im 45/90  soft-tissue]
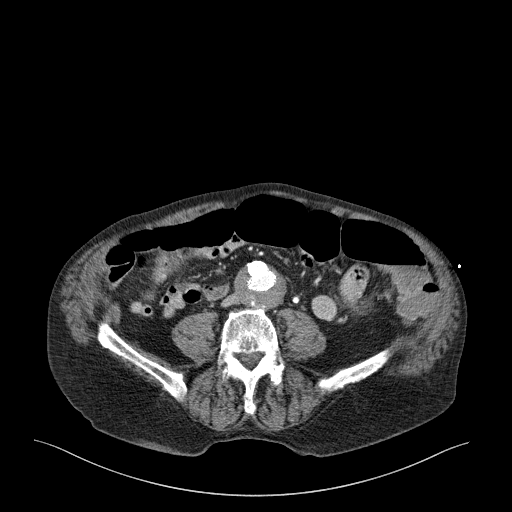
[im 45/90  lung]
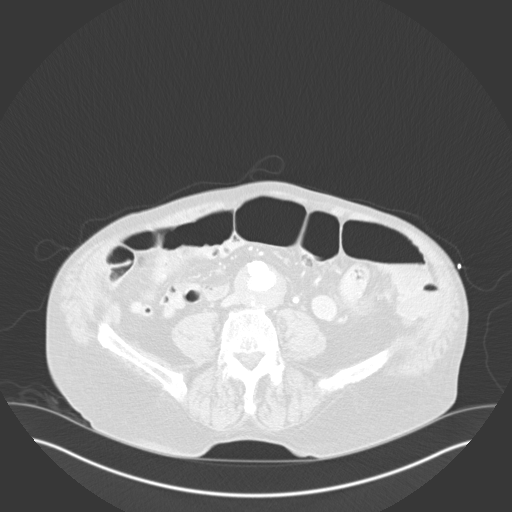
[im 67/90  soft-tissue]
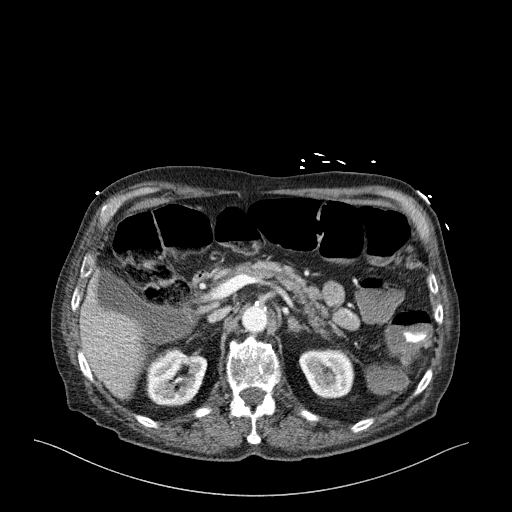
[im 67/90  lung]
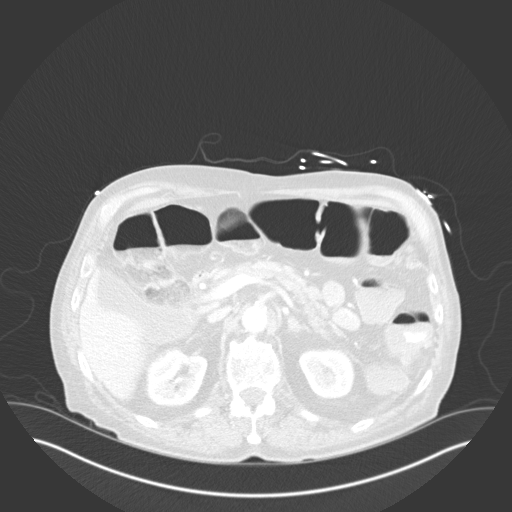

[11 of 46 positions shown; findings below may reference images not displayed]

FINDINGS: VASCULAR

Aorta: Patient has undergone endovascular repair of an abdominal
aortic aneurysm with bifurcated aortic stent graft. The aorta and
stent graft are patent. The native aortic aneurysm sac measures up
to 4.8 cm and measured roughly 4.6 cm in 3710. There is evidence for
an endoleak on the venous phase of imaging. There is contrast within
the aneurysm sac posterior to the graft on sequence 13, image 47.
Findings are compatible with a type 2 endoleak and most likely
related to a lumbar artery.

Celiac: Celiac trunk is patent without significant stenosis.

SMA: Minimal plaque at the origin of the SMA without significant
stenosis. Evidence for active contrast extravasation within the
descending colon on sequence 6, image 69. There is accumulation of
contrast in the same area on the delayed images on sequence 13,
image 27. Findings are compatible with active GI bleeding.

Renals: Bilateral renal arteries are patent without significant
stenosis. Small accessory left renal artery. Aortic stent graft is
positioned below the renal arteries.

IMA: Embolization coils at the origin of the IMA likely related to
previous type 2 leak treatment. Reconstitution of the distal IMA
branches.

Inflow: Bifurcated aortic stent graft extends into the common iliac
arteries bilaterally. Both limbs are widely patent. There is flow in
the internal and external iliac arteries bilaterally.

Proximal Outflow: Proximal femoral arteries are patent bilaterally.

Veins: Patent veins are patent. Main portal venous system is patent.
Renal veins are patent. No gross abnormality to the iliac veins.

Review of the MIP images confirms the above findings.

NON-VASCULAR

Lower chest: Prominent interstitial lung markings bilaterally
suggestive for chronic changes and areas of scarring and fibrosis.
No pleural effusions.

Hepatobiliary: Mild dilatation of left hepatic bile ducts is
unchanged. There may be some dilated peripheral bile ducts in the
right hepatic lobe which are chronic. No acute abnormality to the
liver or gallbladder.

Pancreas: Mild dilatation of the main pancreatic duct is chronic. No
evidence for pancreatic inflammation.

Spleen: Spleen is small for size and stable.

Adrenals/Urinary Tract: Normal appearance of the adrenal glands. No
hydronephrosis. No suspicious renal lesions. There is evidence for a
diverticulum involving the right posterior aspect of the urinary
bladder. There is high-density material near the diverticulum that
is suspicious for an enhancing lesion measuring roughly 2.3 cm.

Stomach/Bowel: Rectum is mildly distended with gas and fluid.
Extensive diverticulosis in the descending colon and proximal
sigmoid colon region. Evidence for active GI bleeding involving the
descending colon along the left lateral abdomen. No other area
suspicious for active GI bleeding. Normal appearance of the stomach.

Lymphatic: No abdominal or pelvic lymphadenopathy.

Reproductive: Prostate is unremarkable.

Other: Negative for free fluid. There is probably some fluid within
the lower inguinal canals.

Musculoskeletal: No acute bone abnormality. Mild anterolisthesis at
L4-L5.
IMPRESSION: VASCULAR

1. Positive for active GI bleeding in the left colon.
2. Endovascular repair of an abdominal aortic aneurysm. The aortic
stent graft is patent.
3. Aortic aneurysm sac has slightly enlarged since 3710 and there is
a type 2 endoleak on the delayed images.

NON-VASCULAR

1. Enhancing lesion along the right side of the bladder. Findings
are suggestive for a primary urothelial tumor. Recommend urology
consultation.
2. Colonic diverticulosis.
3. Question some fluid within the inguinal canals.

These results were called by telephone at the time of interpretation
on 03/22/2020 at [DATE] to provider Dr. Scoby, who verbally
acknowledged these results.

## 2022-05-21 ENCOUNTER — Other Ambulatory Visit (HOSPITAL_COMMUNITY): Payer: Self-pay

## 2022-05-21 MED ORDER — NORMAL SALINE FLUSH 0.9 % IV SOLN
INTRAVENOUS | 1 refills | Status: AC
  Filled 2022-05-21: qty 750, 75d supply, fill #0

## 2022-05-30 ENCOUNTER — Inpatient Hospital Stay: Payer: 59 | Admitting: Nurse Practitioner

## 2022-06-22 DEATH — deceased

## 2022-06-27 ENCOUNTER — Ambulatory Visit (HOSPITAL_COMMUNITY): Payer: 59

## 2022-06-28 ENCOUNTER — Encounter (HOSPITAL_COMMUNITY): Payer: 59 | Admitting: Internal Medicine

## 2022-11-16 IMAGING — DX DG CHEST 1V PORT
1 series · 1 of 1 positions shown · non-contrast
Comparison: September 14, 2020

CLINICAL DATA: Congestive heart failure, fever.

EXAM:
PORTABLE CHEST 1 VIEW

[chest]
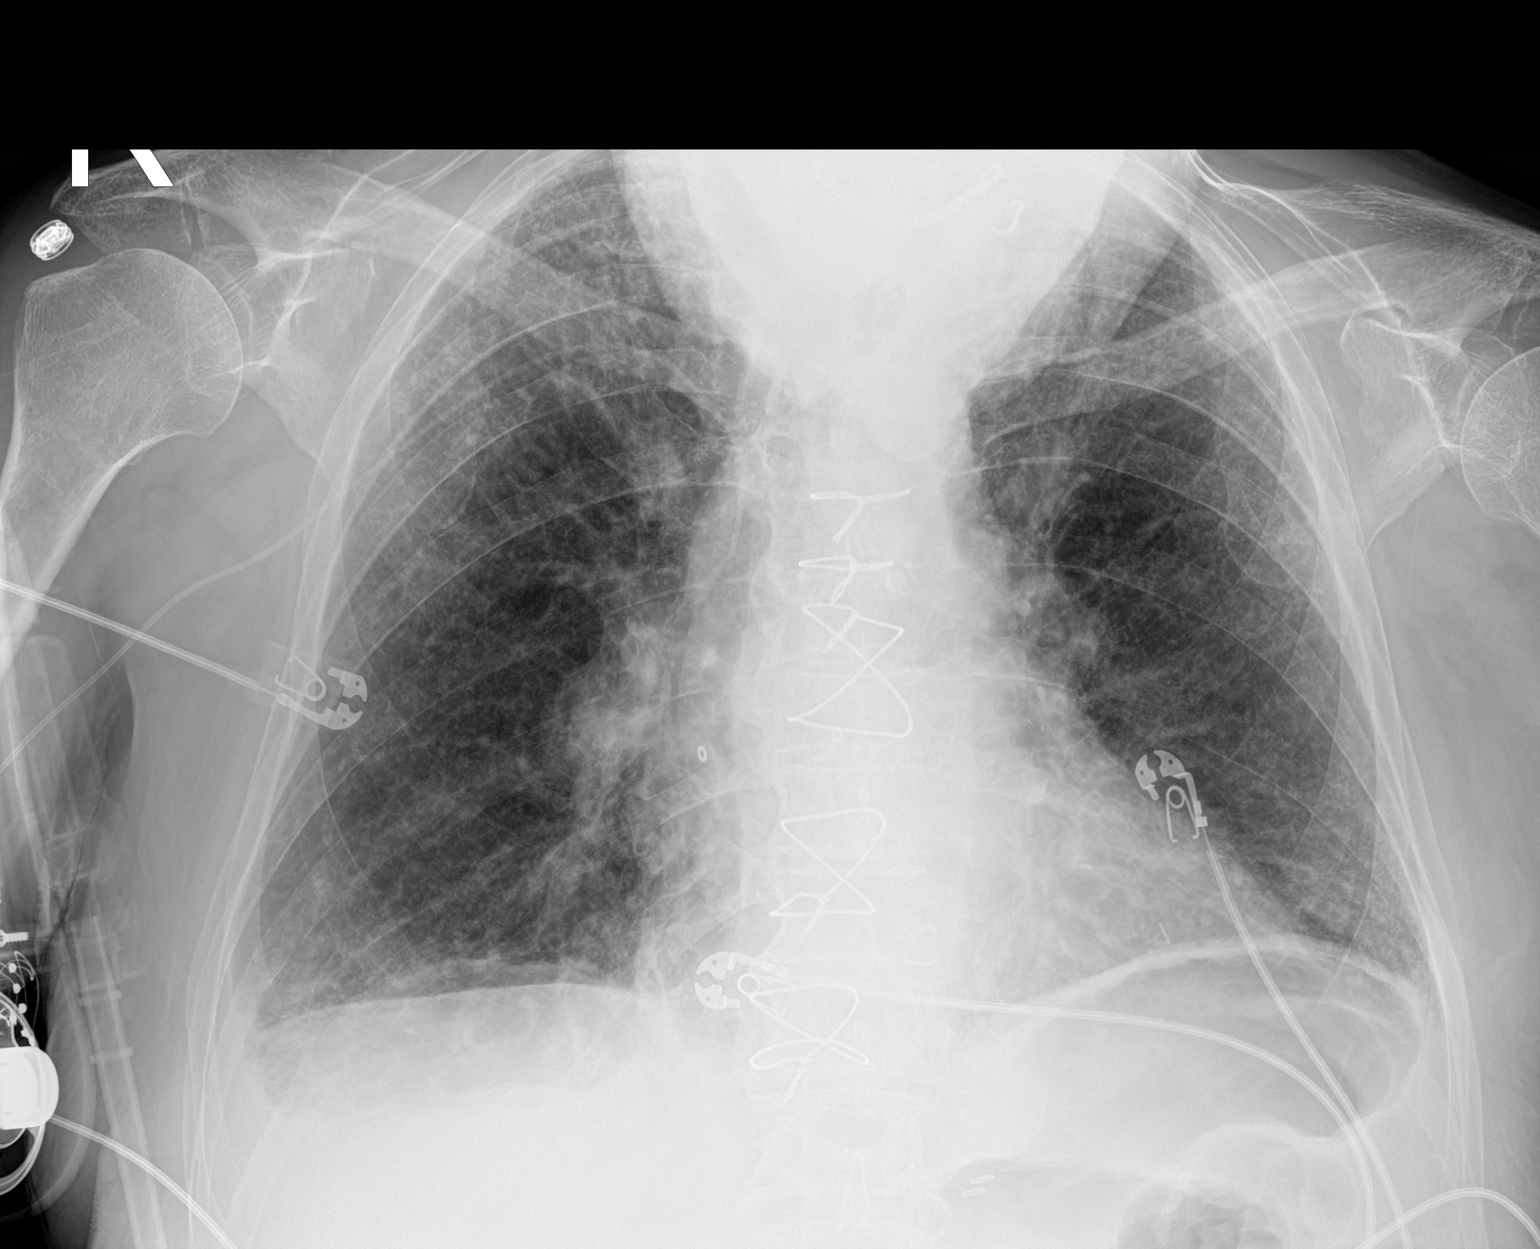

[1 of 1 positions shown; findings below may reference images not displayed]

FINDINGS: Stable cardiomediastinal silhouette. Stable postsurgical changes
from prior CABG.

Low lung volumes.  Chronic coarsening of the interstitium.

Osseous structures are without acute abnormality. Soft tissues are
grossly normal.
IMPRESSION: 1. Low lung volumes with chronic coarsening of the interstitium.
2. No definite focal airspace consolidation.

## 2023-01-22 ENCOUNTER — Other Ambulatory Visit (HOSPITAL_BASED_OUTPATIENT_CLINIC_OR_DEPARTMENT_OTHER): Payer: 59
